# Patient Record
Sex: Female | Born: 1940 | Race: Black or African American | Hispanic: No | Smoking: Former smoker
Health system: Southern US, Community
[De-identification: ages and names within clinical notes are randomized; demographics above are authoritative.]

## PROBLEM LIST (undated history)

## (undated) DIAGNOSIS — E785 Hyperlipidemia, unspecified: Secondary | ICD-10-CM

## (undated) DIAGNOSIS — I10 Essential (primary) hypertension: Secondary | ICD-10-CM

## (undated) DIAGNOSIS — I2510 Atherosclerotic heart disease of native coronary artery without angina pectoris: Secondary | ICD-10-CM

## (undated) DIAGNOSIS — I5032 Chronic diastolic (congestive) heart failure: Secondary | ICD-10-CM

## (undated) DIAGNOSIS — I4510 Unspecified right bundle-branch block: Secondary | ICD-10-CM

## (undated) DIAGNOSIS — E119 Type 2 diabetes mellitus without complications: Secondary | ICD-10-CM

## (undated) DIAGNOSIS — G4733 Obstructive sleep apnea (adult) (pediatric): Secondary | ICD-10-CM

## (undated) HISTORY — PX: KNEE SURGERY: SHX244

## (undated) HISTORY — DX: Morbid (severe) obesity due to excess calories: E66.01

## (undated) HISTORY — PX: OTHER SURGICAL HISTORY: SHX169

## (undated) HISTORY — DX: Hyperlipidemia, unspecified: E78.5

## (undated) HISTORY — PX: CORONARY ANGIOPLASTY WITH STENT PLACEMENT: SHX49

## (undated) HISTORY — PX: BACK SURGERY: SHX140

## (undated) HISTORY — DX: Obstructive sleep apnea (adult) (pediatric): G47.33

## (undated) HISTORY — DX: Unspecified right bundle-branch block: I45.10

## (undated) HISTORY — PX: CORONARY ARTERY BYPASS GRAFT: SHX141

## (undated) HISTORY — DX: Chronic diastolic (congestive) heart failure: I50.32

---

## 1997-11-15 ENCOUNTER — Ambulatory Visit (HOSPITAL_COMMUNITY): Admission: RE | Admit: 1997-11-15 | Discharge: 1997-11-15 | Payer: Self-pay

## 1997-12-27 ENCOUNTER — Ambulatory Visit (HOSPITAL_COMMUNITY): Admission: RE | Admit: 1997-12-27 | Discharge: 1997-12-27 | Payer: Self-pay | Admitting: Orthopedic Surgery

## 1998-01-11 ENCOUNTER — Other Ambulatory Visit: Admission: RE | Admit: 1998-01-11 | Discharge: 1998-01-11 | Payer: Self-pay | Admitting: *Deleted

## 1998-01-17 ENCOUNTER — Ambulatory Visit (HOSPITAL_COMMUNITY): Admission: RE | Admit: 1998-01-17 | Discharge: 1998-01-17 | Payer: Self-pay | Admitting: *Deleted

## 1998-02-28 ENCOUNTER — Inpatient Hospital Stay (HOSPITAL_COMMUNITY): Admission: RE | Admit: 1998-02-28 | Discharge: 1998-03-07 | Payer: Self-pay | Admitting: *Deleted

## 1998-03-04 ENCOUNTER — Encounter: Payer: Self-pay | Admitting: *Deleted

## 1998-09-20 ENCOUNTER — Ambulatory Visit (HOSPITAL_COMMUNITY): Admission: RE | Admit: 1998-09-20 | Discharge: 1998-09-20 | Payer: Self-pay | Admitting: Orthopedic Surgery

## 1998-09-20 ENCOUNTER — Encounter: Payer: Self-pay | Admitting: Orthopedic Surgery

## 1999-12-05 ENCOUNTER — Ambulatory Visit (HOSPITAL_COMMUNITY): Admission: RE | Admit: 1999-12-05 | Discharge: 1999-12-05 | Payer: Self-pay | Admitting: Neurology

## 1999-12-07 ENCOUNTER — Ambulatory Visit (HOSPITAL_COMMUNITY): Admission: RE | Admit: 1999-12-07 | Discharge: 1999-12-07 | Payer: Self-pay | Admitting: Neurology

## 2000-01-28 ENCOUNTER — Encounter: Admission: RE | Admit: 2000-01-28 | Discharge: 2000-03-06 | Payer: Self-pay | Admitting: Orthopedic Surgery

## 2001-04-01 ENCOUNTER — Ambulatory Visit (HOSPITAL_COMMUNITY): Admission: RE | Admit: 2001-04-01 | Discharge: 2001-04-01 | Payer: Self-pay | Admitting: *Deleted

## 2001-04-02 ENCOUNTER — Ambulatory Visit (HOSPITAL_COMMUNITY): Admission: RE | Admit: 2001-04-02 | Discharge: 2001-04-02 | Payer: Self-pay | Admitting: Internal Medicine

## 2001-04-02 ENCOUNTER — Encounter: Payer: Self-pay | Admitting: Internal Medicine

## 2002-07-20 ENCOUNTER — Ambulatory Visit (HOSPITAL_COMMUNITY): Admission: RE | Admit: 2002-07-20 | Discharge: 2002-07-20 | Payer: Self-pay | Admitting: Orthopedic Surgery

## 2002-07-20 ENCOUNTER — Encounter: Payer: Self-pay | Admitting: Orthopedic Surgery

## 2002-08-19 ENCOUNTER — Encounter: Payer: Self-pay | Admitting: Orthopedic Surgery

## 2002-08-25 ENCOUNTER — Inpatient Hospital Stay (HOSPITAL_COMMUNITY): Admission: RE | Admit: 2002-08-25 | Discharge: 2002-08-27 | Payer: Self-pay | Admitting: Orthopedic Surgery

## 2002-08-30 ENCOUNTER — Emergency Department (HOSPITAL_COMMUNITY): Admission: EM | Admit: 2002-08-30 | Discharge: 2002-08-30 | Payer: Self-pay | Admitting: Emergency Medicine

## 2002-10-19 ENCOUNTER — Encounter: Admission: RE | Admit: 2002-10-19 | Discharge: 2003-01-13 | Payer: Self-pay | Admitting: Orthopedic Surgery

## 2002-12-20 ENCOUNTER — Emergency Department (HOSPITAL_COMMUNITY): Admission: EM | Admit: 2002-12-20 | Discharge: 2002-12-20 | Payer: Self-pay

## 2003-01-05 ENCOUNTER — Encounter: Payer: Self-pay | Admitting: Internal Medicine

## 2003-01-05 ENCOUNTER — Ambulatory Visit (HOSPITAL_COMMUNITY): Admission: RE | Admit: 2003-01-05 | Discharge: 2003-01-05 | Payer: Self-pay | Admitting: Internal Medicine

## 2003-05-13 ENCOUNTER — Emergency Department (HOSPITAL_COMMUNITY): Admission: EM | Admit: 2003-05-13 | Discharge: 2003-05-13 | Payer: Self-pay

## 2003-05-17 ENCOUNTER — Inpatient Hospital Stay (HOSPITAL_COMMUNITY): Admission: EM | Admit: 2003-05-17 | Discharge: 2003-05-20 | Payer: Self-pay | Admitting: Emergency Medicine

## 2003-05-18 ENCOUNTER — Encounter: Payer: Self-pay | Admitting: Cardiology

## 2003-06-16 ENCOUNTER — Ambulatory Visit (HOSPITAL_COMMUNITY): Admission: RE | Admit: 2003-06-16 | Discharge: 2003-06-16 | Payer: Self-pay | Admitting: Internal Medicine

## 2003-08-02 ENCOUNTER — Inpatient Hospital Stay (HOSPITAL_COMMUNITY): Admission: RE | Admit: 2003-08-02 | Discharge: 2003-08-26 | Payer: Self-pay | Admitting: Cardiology

## 2003-09-12 ENCOUNTER — Encounter
Admission: RE | Admit: 2003-09-12 | Discharge: 2003-09-12 | Payer: Self-pay | Admitting: Thoracic Surgery (Cardiothoracic Vascular Surgery)

## 2003-12-05 ENCOUNTER — Encounter (HOSPITAL_COMMUNITY): Admission: RE | Admit: 2003-12-05 | Discharge: 2004-03-04 | Payer: Self-pay | Admitting: Cardiology

## 2003-12-14 ENCOUNTER — Ambulatory Visit (HOSPITAL_COMMUNITY): Admission: RE | Admit: 2003-12-14 | Discharge: 2003-12-14 | Payer: Self-pay | Admitting: Internal Medicine

## 2004-02-29 ENCOUNTER — Ambulatory Visit (HOSPITAL_COMMUNITY): Admission: RE | Admit: 2004-02-29 | Discharge: 2004-02-29 | Payer: Self-pay | Admitting: Internal Medicine

## 2004-03-05 ENCOUNTER — Encounter (HOSPITAL_COMMUNITY): Admission: RE | Admit: 2004-03-05 | Discharge: 2004-06-03 | Payer: Self-pay | Admitting: Cardiology

## 2004-09-10 ENCOUNTER — Encounter: Admission: RE | Admit: 2004-09-10 | Discharge: 2004-10-29 | Payer: Self-pay | Admitting: Orthopedic Surgery

## 2006-09-16 ENCOUNTER — Encounter: Admission: RE | Admit: 2006-09-16 | Discharge: 2006-09-16 | Payer: Self-pay | Admitting: Orthopedic Surgery

## 2006-11-04 ENCOUNTER — Emergency Department (HOSPITAL_COMMUNITY): Admission: EM | Admit: 2006-11-04 | Discharge: 2006-11-04 | Payer: Self-pay | Admitting: Emergency Medicine

## 2007-03-18 ENCOUNTER — Encounter: Admission: RE | Admit: 2007-03-18 | Discharge: 2007-03-18 | Payer: Self-pay | Admitting: Orthopedic Surgery

## 2007-04-14 ENCOUNTER — Observation Stay (HOSPITAL_COMMUNITY): Admission: RE | Admit: 2007-04-14 | Discharge: 2007-04-15 | Payer: Self-pay | Admitting: Orthopedic Surgery

## 2007-05-11 ENCOUNTER — Encounter: Admission: RE | Admit: 2007-05-11 | Discharge: 2007-05-22 | Payer: Self-pay | Admitting: Orthopedic Surgery

## 2007-05-14 ENCOUNTER — Emergency Department (HOSPITAL_COMMUNITY): Admission: EM | Admit: 2007-05-14 | Discharge: 2007-05-14 | Payer: Self-pay | Admitting: Emergency Medicine

## 2007-05-26 ENCOUNTER — Inpatient Hospital Stay (HOSPITAL_COMMUNITY): Admission: RE | Admit: 2007-05-26 | Discharge: 2007-06-02 | Payer: Self-pay | Admitting: Orthopedic Surgery

## 2007-06-09 ENCOUNTER — Other Ambulatory Visit: Payer: Self-pay | Admitting: Emergency Medicine

## 2007-06-10 ENCOUNTER — Inpatient Hospital Stay (HOSPITAL_COMMUNITY): Admission: AD | Admit: 2007-06-10 | Discharge: 2007-06-17 | Payer: Self-pay | Admitting: Orthopedic Surgery

## 2007-10-23 ENCOUNTER — Encounter: Admission: RE | Admit: 2007-10-23 | Discharge: 2007-10-23 | Payer: Self-pay | Admitting: Orthopedic Surgery

## 2008-03-09 ENCOUNTER — Encounter: Admission: RE | Admit: 2008-03-09 | Discharge: 2008-03-09 | Payer: Self-pay | Admitting: Orthopedic Surgery

## 2008-09-26 ENCOUNTER — Ambulatory Visit (HOSPITAL_COMMUNITY): Admission: RE | Admit: 2008-09-26 | Discharge: 2008-09-26 | Payer: Self-pay | Admitting: Internal Medicine

## 2009-02-15 ENCOUNTER — Ambulatory Visit (HOSPITAL_COMMUNITY): Admission: RE | Admit: 2009-02-15 | Discharge: 2009-02-15 | Payer: Self-pay | Admitting: Internal Medicine

## 2009-02-21 ENCOUNTER — Encounter: Payer: Self-pay | Admitting: Internal Medicine

## 2009-02-22 ENCOUNTER — Inpatient Hospital Stay (HOSPITAL_COMMUNITY): Admission: EM | Admit: 2009-02-22 | Discharge: 2009-03-03 | Payer: Self-pay | Admitting: Emergency Medicine

## 2009-02-24 ENCOUNTER — Ambulatory Visit: Payer: Self-pay | Admitting: Pulmonary Disease

## 2009-06-22 ENCOUNTER — Inpatient Hospital Stay (HOSPITAL_COMMUNITY): Admission: EM | Admit: 2009-06-22 | Discharge: 2009-06-25 | Payer: Self-pay | Admitting: Emergency Medicine

## 2009-07-13 ENCOUNTER — Emergency Department (HOSPITAL_COMMUNITY): Admission: EM | Admit: 2009-07-13 | Discharge: 2009-07-13 | Payer: Self-pay | Admitting: Emergency Medicine

## 2009-08-28 ENCOUNTER — Inpatient Hospital Stay (HOSPITAL_COMMUNITY): Admission: EM | Admit: 2009-08-28 | Discharge: 2009-09-04 | Payer: Self-pay | Admitting: Emergency Medicine

## 2009-08-29 ENCOUNTER — Ambulatory Visit: Payer: Self-pay | Admitting: Surgery

## 2009-08-29 ENCOUNTER — Encounter (INDEPENDENT_AMBULATORY_CARE_PROVIDER_SITE_OTHER): Payer: Self-pay | Admitting: Internal Medicine

## 2009-09-24 ENCOUNTER — Inpatient Hospital Stay (HOSPITAL_COMMUNITY): Admission: EM | Admit: 2009-09-24 | Discharge: 2009-10-02 | Payer: Self-pay | Admitting: Emergency Medicine

## 2009-09-28 ENCOUNTER — Encounter (INDEPENDENT_AMBULATORY_CARE_PROVIDER_SITE_OTHER): Payer: Self-pay | Admitting: Internal Medicine

## 2010-09-26 LAB — COMPREHENSIVE METABOLIC PANEL
ALT: 14 U/L (ref 0–35)
ALT: 16 U/L (ref 0–35)
AST: 24 U/L (ref 0–37)
Albumin: 2.3 g/dL — ABNORMAL LOW (ref 3.5–5.2)
BUN: 7 mg/dL (ref 6–23)
CO2: 32 mEq/L (ref 19–32)
Calcium: 8.2 mg/dL — ABNORMAL LOW (ref 8.4–10.5)
Calcium: 8.2 mg/dL — ABNORMAL LOW (ref 8.4–10.5)
Calcium: 8.5 mg/dL (ref 8.4–10.5)
Creatinine, Ser: 0.86 mg/dL (ref 0.4–1.2)
Creatinine, Ser: 0.87 mg/dL (ref 0.4–1.2)
GFR calc Af Amer: >60 mL/min (ref >60)
GFR calc Af Amer: >60 mL/min (ref >60)
GFR calc non Af Amer: >60 mL/min (ref >60)
Glucose, Bld: 179 mg/dL — ABNORMAL HIGH (ref 70–99)
Glucose, Bld: 217 mg/dL — ABNORMAL HIGH (ref 70–99)
Sodium: 134 mEq/L — ABNORMAL LOW (ref 135–145)
Sodium: 140 mEq/L (ref 135–145)
Total Protein: 5.9 g/dL — ABNORMAL LOW (ref 6.0–8.3)
Total Protein: 6.2 g/dL (ref 6.0–8.3)

## 2010-09-26 LAB — CBC
HCT: 30.3 % — ABNORMAL LOW (ref 36.0–46.0)
HCT: 30.3 % — ABNORMAL LOW (ref 36.0–46.0)
HCT: 32.2 % — ABNORMAL LOW (ref 36.0–46.0)
HCT: 34.6 % — ABNORMAL LOW (ref 36.0–46.0)
Hemoglobin: 10.3 g/dL — ABNORMAL LOW (ref 12.0–15.0)
Hemoglobin: 10.9 g/dL — ABNORMAL LOW (ref 12.0–15.0)
Hemoglobin: 10.9 g/dL — ABNORMAL LOW (ref 12.0–15.0)
Hemoglobin: 11.6 g/dL — ABNORMAL LOW (ref 12.0–15.0)
Hemoglobin: 13.8 g/dL (ref 12.0–15.0)
MCHC: 33.5 g/dL (ref 30.0–36.0)
MCHC: 33.6 g/dL (ref 30.0–36.0)
MCHC: 33.7 g/dL (ref 30.0–36.0)
MCHC: 33.8 g/dL (ref 30.0–36.0)
MCHC: 33.8 g/dL (ref 30.0–36.0)
MCHC: 34.2 g/dL (ref 30.0–36.0)
MCV: 102.5 fL — ABNORMAL HIGH (ref 78.0–100.0)
MCV: 102.5 fL — ABNORMAL HIGH (ref 78.0–100.0)
MCV: 103 fL — ABNORMAL HIGH (ref 78.0–100.0)
Platelets: 142 10*3/uL — ABNORMAL LOW (ref 150–400)
Platelets: 151 10*3/uL (ref 150–400)
Platelets: 157 10*3/uL (ref 150–400)
Platelets: 181 10*3/uL (ref 150–400)
RBC: 2.94 MIL/uL — ABNORMAL LOW (ref 3.87–5.11)
RBC: 2.98 MIL/uL — ABNORMAL LOW (ref 3.87–5.11)
RBC: 3.17 MIL/uL — ABNORMAL LOW (ref 3.87–5.11)
RBC: 3.47 MIL/uL — ABNORMAL LOW (ref 3.87–5.11)
RBC: 3.98 MIL/uL (ref 3.87–5.11)
RDW: 14.3 % (ref 11.5–15.5)
RDW: 14.3 % (ref 11.5–15.5)
RDW: 14.8 % (ref 11.5–15.5)
RDW: 15 % (ref 11.5–15.5)
WBC: 5.7 10*3/uL (ref 4.0–10.5)
WBC: 9 10*3/uL (ref 4.0–10.5)

## 2010-09-26 LAB — GLUCOSE, CAPILLARY
Glucose-Capillary: 122 mg/dL — ABNORMAL HIGH (ref 70–99)
Glucose-Capillary: 145 mg/dL — ABNORMAL HIGH (ref 70–99)
Glucose-Capillary: 147 mg/dL — ABNORMAL HIGH (ref 70–99)
Glucose-Capillary: 158 mg/dL — ABNORMAL HIGH (ref 70–99)
Glucose-Capillary: 169 mg/dL — ABNORMAL HIGH (ref 70–99)
Glucose-Capillary: 174 mg/dL — ABNORMAL HIGH (ref 70–99)
Glucose-Capillary: 176 mg/dL — ABNORMAL HIGH (ref 70–99)
Glucose-Capillary: 177 mg/dL — ABNORMAL HIGH (ref 70–99)
Glucose-Capillary: 188 mg/dL — ABNORMAL HIGH (ref 70–99)
Glucose-Capillary: 192 mg/dL — ABNORMAL HIGH (ref 70–99)
Glucose-Capillary: 197 mg/dL — ABNORMAL HIGH (ref 70–99)
Glucose-Capillary: 199 mg/dL — ABNORMAL HIGH (ref 70–99)
Glucose-Capillary: 201 mg/dL — ABNORMAL HIGH (ref 70–99)
Glucose-Capillary: 203 mg/dL — ABNORMAL HIGH (ref 70–99)
Glucose-Capillary: 243 mg/dL — ABNORMAL HIGH (ref 70–99)
Glucose-Capillary: 252 mg/dL — ABNORMAL HIGH (ref 70–99)

## 2010-09-26 LAB — LIPID PANEL
Cholesterol: 105 mg/dL (ref 0–200)
Cholesterol: 109 mg/dL (ref 0–200)
HDL: 43 mg/dL (ref >39)
LDL Cholesterol: 41 mg/dL (ref 0–99)
LDL Cholesterol: 47 mg/dL (ref 0–99)
Total CHOL/HDL Ratio: 2.4 RATIO
Triglycerides: 103 mg/dL (ref <150)
Triglycerides: 84 mg/dL (ref <150)
VLDL: 17 mg/dL (ref 0–40)

## 2010-09-26 LAB — BASIC METABOLIC PANEL
BUN: 2 mg/dL — ABNORMAL LOW (ref 6–23)
BUN: 3 mg/dL — ABNORMAL LOW (ref 6–23)
BUN: 4 mg/dL — ABNORMAL LOW (ref 6–23)
CO2: 29 mEq/L (ref 19–32)
CO2: 29 mEq/L (ref 19–32)
CO2: 30 mEq/L (ref 19–32)
CO2: 30 mEq/L (ref 19–32)
Calcium: 8.5 mg/dL (ref 8.4–10.5)
Calcium: 8.5 mg/dL (ref 8.4–10.5)
Chloride: 100 mEq/L (ref 96–112)
Creatinine, Ser: 0.68 mg/dL (ref 0.4–1.2)
GFR calc Af Amer: >60 mL/min (ref >60)
GFR calc Af Amer: >60 mL/min (ref >60)
GFR calc non Af Amer: >60 mL/min (ref >60)
GFR calc non Af Amer: >60 mL/min (ref >60)
Glucose, Bld: 153 mg/dL — ABNORMAL HIGH (ref 70–99)
Glucose, Bld: 222 mg/dL — ABNORMAL HIGH (ref 70–99)
Potassium: 3.9 mEq/L (ref 3.5–5.1)
Potassium: 4.1 mEq/L (ref 3.5–5.1)
Potassium: 4.1 mEq/L (ref 3.5–5.1)
Potassium: 4.2 mEq/L (ref 3.5–5.1)
Sodium: 131 mEq/L — ABNORMAL LOW (ref 135–145)
Sodium: 132 mEq/L — ABNORMAL LOW (ref 135–145)
Sodium: 134 mEq/L — ABNORMAL LOW (ref 135–145)
Sodium: 135 mEq/L (ref 135–145)

## 2010-09-26 LAB — URINE CULTURE
Colony Count: NO GROWTH
Culture: NO GROWTH

## 2010-09-26 LAB — POCT I-STAT, CHEM 8
BUN: 4 mg/dL — ABNORMAL LOW (ref 6–23)
Chloride: 95 mEq/L — ABNORMAL LOW (ref 96–112)
Potassium: 2.8 mEq/L — ABNORMAL LOW (ref 3.5–5.1)
Sodium: 139 mEq/L (ref 135–145)

## 2010-09-26 LAB — HEPARIN LEVEL (UNFRACTIONATED): Heparin Unfractionated: 0.79 IU/mL — ABNORMAL HIGH (ref 0.30–0.70)

## 2010-09-26 LAB — DIFFERENTIAL
Basophils Absolute: 0 10*3/uL (ref 0.0–0.1)
Basophils Relative: 0 % (ref 0–1)
Lymphocytes Relative: 13 % (ref 12–46)
Lymphs Abs: 1 10*3/uL (ref 0.7–4.0)
Monocytes Absolute: 0.3 10*3/uL (ref 0.1–1.0)
Monocytes Relative: 5 % (ref 3–12)
Monocytes Relative: 7 % (ref 3–12)
Neutro Abs: 6.2 10*3/uL (ref 1.7–7.7)
Neutro Abs: 9.5 10*3/uL — ABNORMAL HIGH (ref 1.7–7.7)
Neutrophils Relative %: 81 % — ABNORMAL HIGH (ref 43–77)

## 2010-09-26 LAB — HEPATIC FUNCTION PANEL
ALT: 15 U/L (ref 0–35)
AST: 24 U/L (ref 0–37)
Albumin: 2.4 g/dL — ABNORMAL LOW (ref 3.5–5.2)
Alkaline Phosphatase: 72 U/L (ref 39–117)
Total Protein: 6.4 g/dL (ref 6.0–8.3)

## 2010-09-26 LAB — URINALYSIS, ROUTINE W REFLEX MICROSCOPIC
Nitrite: NEGATIVE
Protein, ur: 30 mg/dL — AB
Specific Gravity, Urine: 1.019 (ref 1.005–1.030)
Urobilinogen, UA: 1 mg/dL (ref 0.0–1.0)

## 2010-09-26 LAB — CK TOTAL AND CKMB (NOT AT ARMC)
CK, MB: 2 ng/mL (ref 0.3–4.0)
Total CK: 44 U/L (ref 7–177)

## 2010-09-26 LAB — LIPASE, BLOOD
Lipase: 1134 U/L — ABNORMAL HIGH (ref 11–59)
Lipase: 115 U/L — ABNORMAL HIGH (ref 11–59)
Lipase: 73 U/L — ABNORMAL HIGH (ref 11–59)

## 2010-09-26 LAB — TROPONIN I
Troponin I: 0.03 ng/mL (ref 0.00–0.06)
Troponin I: 0.03 ng/mL (ref 0.00–0.06)

## 2010-09-26 LAB — AMYLASE: Amylase: 201 U/L — ABNORMAL HIGH (ref 0–105)

## 2010-09-26 LAB — POCT CARDIAC MARKERS
CKMB, poc: 2.6 ng/mL (ref 1.0–8.0)
Troponin i, poc: <0.05 ng/mL (ref 0.00–0.09)

## 2010-09-26 LAB — MAGNESIUM: Magnesium: 1.5 mg/dL (ref 1.5–2.5)

## 2010-09-26 LAB — CARDIAC PANEL(CRET KIN+CKTOT+MB+TROPI): Total CK: 49 U/L (ref 7–177)

## 2010-09-26 LAB — APTT: aPTT: 32 seconds (ref 24–37)

## 2010-09-26 LAB — PHOSPHORUS: Phosphorus: 3.6 mg/dL (ref 2.3–4.6)

## 2010-09-26 LAB — URINE MICROSCOPIC-ADD ON

## 2010-09-26 LAB — CREATININE, URINE, RANDOM: Creatinine, Urine: 256.6 mg/dL

## 2010-09-26 LAB — TSH: TSH: 1.794 u[IU]/mL (ref 0.350–4.500)

## 2010-09-26 LAB — D-DIMER, QUANTITATIVE: D-Dimer, Quant: 6.13 ug/mL-FEU — ABNORMAL HIGH (ref 0.00–0.48)

## 2010-09-28 LAB — CBC
HCT: 32.1 % — ABNORMAL LOW (ref 36.0–46.0)
HCT: 33.8 % — ABNORMAL LOW (ref 36.0–46.0)
HCT: 42.5 % (ref 36.0–46.0)
Hemoglobin: 10.9 g/dL — ABNORMAL LOW (ref 12.0–15.0)
Hemoglobin: 11.2 g/dL — ABNORMAL LOW (ref 12.0–15.0)
Hemoglobin: 14 g/dL (ref 12.0–15.0)
MCHC: 33 g/dL (ref 30.0–36.0)
MCHC: 33.2 g/dL (ref 30.0–36.0)
MCHC: 33.4 g/dL (ref 30.0–36.0)
MCV: 98.9 fL (ref 78.0–100.0)
MCV: 99.5 fL (ref 78.0–100.0)
MCV: 99.7 fL (ref 78.0–100.0)
Platelets: 135 10*3/uL — ABNORMAL LOW (ref 150–400)
Platelets: 162 10*3/uL (ref 150–400)
Platelets: 199 K/uL (ref 150–400)
Platelets: ADEQUATE 10*3/uL (ref 150–400)
RBC: 4.29 MIL/uL (ref 3.87–5.11)
RDW: 15.9 % — ABNORMAL HIGH (ref 11.5–15.5)
RDW: 15.9 % — ABNORMAL HIGH (ref 11.5–15.5)
RDW: 16 % — ABNORMAL HIGH (ref 11.5–15.5)
RDW: 16.1 % — ABNORMAL HIGH (ref 11.5–15.5)
RDW: 16.1 % — ABNORMAL HIGH (ref 11.5–15.5)
WBC: 11.7 K/uL — ABNORMAL HIGH (ref 4.0–10.5)
WBC: 6.1 10*3/uL (ref 4.0–10.5)

## 2010-09-28 LAB — URINALYSIS, ROUTINE W REFLEX MICROSCOPIC
Glucose, UA: NEGATIVE mg/dL
Ketones, ur: NEGATIVE mg/dL
Nitrite: NEGATIVE
Protein, ur: 30 mg/dL — AB
Specific Gravity, Urine: 1.021 (ref 1.005–1.030)
Urobilinogen, UA: 1 mg/dL (ref 0.0–1.0)
pH: 6 (ref 5.0–8.0)

## 2010-09-28 LAB — CARDIAC PANEL(CRET KIN+CKTOT+MB+TROPI)
Relative Index: INVALID (ref 0.0–2.5)
Relative Index: INVALID (ref 0.0–2.5)
Total CK: 38 U/L (ref 7–177)
Troponin I: 0.01 ng/mL (ref 0.00–0.06)
Troponin I: 0.02 ng/mL (ref 0.00–0.06)
Troponin I: 0.02 ng/mL (ref 0.00–0.06)

## 2010-09-28 LAB — GLUCOSE, CAPILLARY
Glucose-Capillary: 103 mg/dL — ABNORMAL HIGH (ref 70–99)
Glucose-Capillary: 122 mg/dL — ABNORMAL HIGH (ref 70–99)
Glucose-Capillary: 130 mg/dL — ABNORMAL HIGH (ref 70–99)
Glucose-Capillary: 132 mg/dL — ABNORMAL HIGH (ref 70–99)
Glucose-Capillary: 132 mg/dL — ABNORMAL HIGH (ref 70–99)
Glucose-Capillary: 142 mg/dL — ABNORMAL HIGH (ref 70–99)
Glucose-Capillary: 142 mg/dL — ABNORMAL HIGH (ref 70–99)
Glucose-Capillary: 144 mg/dL — ABNORMAL HIGH (ref 70–99)
Glucose-Capillary: 144 mg/dL — ABNORMAL HIGH (ref 70–99)
Glucose-Capillary: 153 mg/dL — ABNORMAL HIGH (ref 70–99)
Glucose-Capillary: 158 mg/dL — ABNORMAL HIGH (ref 70–99)
Glucose-Capillary: 159 mg/dL — ABNORMAL HIGH (ref 70–99)
Glucose-Capillary: 164 mg/dL — ABNORMAL HIGH (ref 70–99)
Glucose-Capillary: 164 mg/dL — ABNORMAL HIGH (ref 70–99)
Glucose-Capillary: 164 mg/dL — ABNORMAL HIGH (ref 70–99)
Glucose-Capillary: 165 mg/dL — ABNORMAL HIGH (ref 70–99)
Glucose-Capillary: 166 mg/dL — ABNORMAL HIGH (ref 70–99)
Glucose-Capillary: 170 mg/dL — ABNORMAL HIGH (ref 70–99)
Glucose-Capillary: 170 mg/dL — ABNORMAL HIGH (ref 70–99)
Glucose-Capillary: 173 mg/dL — ABNORMAL HIGH (ref 70–99)
Glucose-Capillary: 175 mg/dL — ABNORMAL HIGH (ref 70–99)
Glucose-Capillary: 175 mg/dL — ABNORMAL HIGH (ref 70–99)
Glucose-Capillary: 179 mg/dL — ABNORMAL HIGH (ref 70–99)
Glucose-Capillary: 181 mg/dL — ABNORMAL HIGH (ref 70–99)
Glucose-Capillary: 181 mg/dL — ABNORMAL HIGH (ref 70–99)
Glucose-Capillary: 194 mg/dL — ABNORMAL HIGH (ref 70–99)
Glucose-Capillary: 199 mg/dL — ABNORMAL HIGH (ref 70–99)
Glucose-Capillary: 199 mg/dL — ABNORMAL HIGH (ref 70–99)
Glucose-Capillary: 204 mg/dL — ABNORMAL HIGH (ref 70–99)
Glucose-Capillary: 204 mg/dL — ABNORMAL HIGH (ref 70–99)
Glucose-Capillary: 205 mg/dL — ABNORMAL HIGH (ref 70–99)
Glucose-Capillary: 210 mg/dL — ABNORMAL HIGH (ref 70–99)
Glucose-Capillary: 215 mg/dL — ABNORMAL HIGH (ref 70–99)
Glucose-Capillary: 225 mg/dL — ABNORMAL HIGH (ref 70–99)
Glucose-Capillary: 232 mg/dL — ABNORMAL HIGH (ref 70–99)
Glucose-Capillary: 242 mg/dL — ABNORMAL HIGH (ref 70–99)
Glucose-Capillary: 247 mg/dL — ABNORMAL HIGH (ref 70–99)
Glucose-Capillary: 248 mg/dL — ABNORMAL HIGH (ref 70–99)
Glucose-Capillary: 293 mg/dL — ABNORMAL HIGH (ref 70–99)
Glucose-Capillary: 91 mg/dL (ref 70–99)
Glucose-Capillary: 99 mg/dL (ref 70–99)

## 2010-09-28 LAB — COMPREHENSIVE METABOLIC PANEL
ALT: 19 U/L (ref 0–35)
ALT: 28 U/L (ref 0–35)
AST: 44 U/L — ABNORMAL HIGH (ref 0–37)
Albumin: 2.3 g/dL — ABNORMAL LOW (ref 3.5–5.2)
Alkaline Phosphatase: 65 U/L (ref 39–117)
BUN: 2 mg/dL — ABNORMAL LOW (ref 6–23)
Calcium: 8.9 mg/dL (ref 8.4–10.5)
Calcium: 9.3 mg/dL (ref 8.4–10.5)
GFR calc Af Amer: >60 mL/min (ref >60)
Potassium: 3.7 mEq/L (ref 3.5–5.1)
Sodium: 135 mEq/L (ref 135–145)
Sodium: 140 mEq/L (ref 135–145)
Total Protein: 5.8 g/dL — ABNORMAL LOW (ref 6.0–8.3)
Total Protein: 7.8 g/dL (ref 6.0–8.3)

## 2010-09-28 LAB — DIFFERENTIAL
Basophils Absolute: 0.1 K/uL (ref 0.0–0.1)
Basophils Relative: 1 % (ref 0–1)
Eosinophils Absolute: 0.2 10*3/uL (ref 0.0–0.7)
Eosinophils Relative: 2 % (ref 0–5)
Lymphocytes Relative: 20 % (ref 12–46)
Lymphs Abs: 2.3 10*3/uL (ref 0.7–4.0)
Monocytes Absolute: 0.8 K/uL (ref 0.1–1.0)
Monocytes Relative: 7 % (ref 3–12)
Neutro Abs: 8.3 K/uL — ABNORMAL HIGH (ref 1.7–7.7)
Neutrophils Relative %: 71 % (ref 43–77)

## 2010-09-28 LAB — TROPONIN I: Troponin I: 0.02 ng/mL (ref 0.00–0.06)

## 2010-09-28 LAB — POCT CARDIAC MARKERS
CKMB, poc: <1 ng/mL — ABNORMAL LOW (ref 1.0–8.0)
Myoglobin, poc: 79 ng/mL (ref 12–200)
Troponin i, poc: <0.05 ng/mL (ref 0.00–0.09)

## 2010-09-28 LAB — BASIC METABOLIC PANEL
BUN: 1 mg/dL — ABNORMAL LOW (ref 6–23)
BUN: 2 mg/dL — ABNORMAL LOW (ref 6–23)
BUN: <1 mg/dL — ABNORMAL LOW (ref 6–23)
CO2: 25 mEq/L (ref 19–32)
CO2: 27 mEq/L (ref 19–32)
CO2: 30 mEq/L (ref 19–32)
Chloride: 102 mEq/L (ref 96–112)
Chloride: 110 mEq/L (ref 96–112)
Chloride: 97 mEq/L (ref 96–112)
Creatinine, Ser: 0.6 mg/dL (ref 0.4–1.2)
Creatinine, Ser: 0.78 mg/dL (ref 0.4–1.2)
GFR calc Af Amer: >60 mL/min (ref >60)
GFR calc non Af Amer: >60 mL/min (ref >60)
GFR calc non Af Amer: >60 mL/min (ref >60)
Glucose, Bld: 112 mg/dL — ABNORMAL HIGH (ref 70–99)
Glucose, Bld: 120 mg/dL — ABNORMAL HIGH (ref 70–99)
Glucose, Bld: 155 mg/dL — ABNORMAL HIGH (ref 70–99)
Potassium: 3.2 mEq/L — ABNORMAL LOW (ref 3.5–5.1)
Potassium: 3.6 mEq/L (ref 3.5–5.1)
Potassium: 3.8 mEq/L (ref 3.5–5.1)
Sodium: 136 mEq/L (ref 135–145)
Sodium: 138 mEq/L (ref 135–145)

## 2010-09-28 LAB — COMPREHENSIVE METABOLIC PANEL WITH GFR
Albumin: 3.1 g/dL — ABNORMAL LOW (ref 3.5–5.2)
Alkaline Phosphatase: 84 U/L (ref 39–117)
BUN: 10 mg/dL (ref 6–23)
CO2: 32 meq/L (ref 19–32)
Chloride: 94 meq/L — ABNORMAL LOW (ref 96–112)
Creatinine, Ser: 0.82 mg/dL (ref 0.4–1.2)
GFR calc non Af Amer: >60 mL/min (ref >60)
Glucose, Bld: 162 mg/dL — ABNORMAL HIGH (ref 70–99)
Potassium: 3.1 meq/L — ABNORMAL LOW (ref 3.5–5.1)
Total Bilirubin: 1.4 mg/dL — ABNORMAL HIGH (ref 0.3–1.2)

## 2010-09-28 LAB — BLOOD GAS, ARTERIAL
Acid-base deficit: 0.5 mmol/L (ref 0.0–2.0)
FIO2: 100 %
O2 Content: 10 L/min
Patient temperature: 98.6
pCO2 arterial: 49.6 mmHg — ABNORMAL HIGH (ref 35.0–45.0)

## 2010-09-28 LAB — CK TOTAL AND CKMB (NOT AT ARMC)
CK, MB: 1 ng/mL (ref 0.3–4.0)
Relative Index: INVALID (ref 0.0–2.5)
Total CK: 14 U/L (ref 7–177)

## 2010-09-28 LAB — LIPASE, BLOOD
Lipase: 334 U/L — ABNORMAL HIGH (ref 11–59)
Lipase: 55 U/L (ref 11–59)

## 2010-09-28 LAB — MRSA PCR SCREENING: MRSA by PCR: NEGATIVE

## 2010-09-28 LAB — URINE MICROSCOPIC-ADD ON

## 2010-09-28 LAB — URINE CULTURE: Colony Count: >=100000

## 2010-09-28 LAB — AMYLASE: Amylase: 91 U/L (ref 0–105)

## 2010-10-08 LAB — POCT CARDIAC MARKERS
CKMB, poc: 4.2 ng/mL (ref 1.0–8.0)
Troponin i, poc: <0.05 ng/mL (ref 0.00–0.09)

## 2010-10-08 LAB — DIFFERENTIAL
Basophils Absolute: 0.1 10*3/uL (ref 0.0–0.1)
Basophils Absolute: 0.1 10*3/uL (ref 0.0–0.1)
Basophils Relative: 1 % (ref 0–1)
Eosinophils Absolute: 0.1 10*3/uL (ref 0.0–0.7)
Lymphocytes Relative: 31 % (ref 12–46)
Lymphocytes Relative: 34 % (ref 12–46)
Lymphs Abs: 1.9 10*3/uL (ref 0.7–4.0)
Monocytes Absolute: 0.6 10*3/uL (ref 0.1–1.0)
Monocytes Relative: 10 % (ref 3–12)
Monocytes Relative: 9 % (ref 3–12)
Neutro Abs: 2.9 10*3/uL (ref 1.7–7.7)
Neutro Abs: 3.3 10*3/uL (ref 1.7–7.7)
Neutrophils Relative %: 60 % (ref 43–77)

## 2010-10-08 LAB — URINE CULTURE
Colony Count: NO GROWTH
Culture: NO GROWTH

## 2010-10-08 LAB — GLUCOSE, CAPILLARY
Glucose-Capillary: 135 mg/dL — ABNORMAL HIGH (ref 70–99)
Glucose-Capillary: 148 mg/dL — ABNORMAL HIGH (ref 70–99)
Glucose-Capillary: 173 mg/dL — ABNORMAL HIGH (ref 70–99)
Glucose-Capillary: 257 mg/dL — ABNORMAL HIGH (ref 70–99)
Glucose-Capillary: 49 mg/dL — ABNORMAL LOW (ref 70–99)
Glucose-Capillary: 51 mg/dL — ABNORMAL LOW (ref 70–99)
Glucose-Capillary: 76 mg/dL (ref 70–99)
Glucose-Capillary: 84 mg/dL (ref 70–99)
Glucose-Capillary: 85 mg/dL (ref 70–99)
Glucose-Capillary: 87 mg/dL (ref 70–99)
Glucose-Capillary: 98 mg/dL (ref 70–99)

## 2010-10-08 LAB — HEPATIC FUNCTION PANEL
Alkaline Phosphatase: 57 U/L (ref 39–117)
Indirect Bilirubin: 0.2 mg/dL — ABNORMAL LOW (ref 0.3–0.9)
Total Bilirubin: 0.5 mg/dL (ref 0.3–1.2)

## 2010-10-08 LAB — BASIC METABOLIC PANEL
BUN: 26 mg/dL — ABNORMAL HIGH (ref 6–23)
BUN: 27 mg/dL — ABNORMAL HIGH (ref 6–23)
CO2: 16 mEq/L — ABNORMAL LOW (ref 19–32)
CO2: 21 mEq/L (ref 19–32)
Calcium: 8.3 mg/dL — ABNORMAL LOW (ref 8.4–10.5)
Calcium: 9.3 mg/dL (ref 8.4–10.5)
Calcium: 9.3 mg/dL (ref 8.4–10.5)
Chloride: 110 mEq/L (ref 96–112)
Chloride: 118 mEq/L — ABNORMAL HIGH (ref 96–112)
Creatinine, Ser: 1.03 mg/dL (ref 0.4–1.2)
Creatinine, Ser: 1.58 mg/dL — ABNORMAL HIGH (ref 0.4–1.2)
Creatinine, Ser: 1.62 mg/dL — ABNORMAL HIGH (ref 0.4–1.2)
GFR calc Af Amer: 38 mL/min — ABNORMAL LOW (ref >60)
GFR calc Af Amer: >60 mL/min (ref >60)
GFR calc Af Amer: >60 mL/min (ref >60)
GFR calc non Af Amer: 31 mL/min — ABNORMAL LOW (ref >60)
GFR calc non Af Amer: 31 mL/min — ABNORMAL LOW (ref >60)
GFR calc non Af Amer: 53 mL/min — ABNORMAL LOW (ref >60)
GFR calc non Af Amer: 54 mL/min — ABNORMAL LOW (ref >60)
Glucose, Bld: 136 mg/dL — ABNORMAL HIGH (ref 70–99)
Potassium: 3.6 mEq/L (ref 3.5–5.1)
Sodium: 136 mEq/L (ref 135–145)
Sodium: 137 mEq/L (ref 135–145)
Sodium: 142 mEq/L (ref 135–145)

## 2010-10-08 LAB — CBC
HCT: 27.6 % — ABNORMAL LOW (ref 36.0–46.0)
Hemoglobin: 10.7 g/dL — ABNORMAL LOW (ref 12.0–15.0)
Hemoglobin: 11.3 g/dL — ABNORMAL LOW (ref 12.0–15.0)
Hemoglobin: 8.9 g/dL — ABNORMAL LOW (ref 12.0–15.0)
MCHC: 32.2 g/dL (ref 30.0–36.0)
MCHC: 32.6 g/dL (ref 30.0–36.0)
MCV: 99.2 fL (ref 78.0–100.0)
Platelets: 154 10*3/uL (ref 150–400)
Platelets: 188 10*3/uL (ref 150–400)
RBC: 2.71 MIL/uL — ABNORMAL LOW (ref 3.87–5.11)
RBC: 2.78 MIL/uL — ABNORMAL LOW (ref 3.87–5.11)
RBC: 3.31 MIL/uL — ABNORMAL LOW (ref 3.87–5.11)
RDW: 17 % — ABNORMAL HIGH (ref 11.5–15.5)
WBC: 5.4 10*3/uL (ref 4.0–10.5)
WBC: 5.6 10*3/uL (ref 4.0–10.5)
WBC: 6.5 10*3/uL (ref 4.0–10.5)

## 2010-10-08 LAB — POTASSIUM
Potassium: 4.8 mEq/L (ref 3.5–5.1)
Potassium: 6.6 mEq/L (ref 3.5–5.1)

## 2010-10-08 LAB — URINE MICROSCOPIC-ADD ON

## 2010-10-08 LAB — URINALYSIS, ROUTINE W REFLEX MICROSCOPIC
Nitrite: NEGATIVE
Protein, ur: 30 mg/dL — AB
Specific Gravity, Urine: 1.029 (ref 1.005–1.030)
Urobilinogen, UA: 1 mg/dL (ref 0.0–1.0)

## 2010-10-08 LAB — LACTIC ACID, PLASMA: Lactic Acid, Venous: 2.4 mmol/L — ABNORMAL HIGH (ref 0.5–2.2)

## 2010-10-08 LAB — VITAMIN B12: Vitamin B-12: 554 pg/mL (ref 211–911)

## 2010-10-13 LAB — BASIC METABOLIC PANEL
BUN: 20 mg/dL (ref 6–23)
BUN: 27 mg/dL — ABNORMAL HIGH (ref 6–23)
CO2: 24 mEq/L (ref 19–32)
CO2: 27 mEq/L (ref 19–32)
CO2: 27 mEq/L (ref 19–32)
Calcium: 8.6 mg/dL (ref 8.4–10.5)
Calcium: 9.3 mg/dL (ref 8.4–10.5)
Chloride: 91 mEq/L — ABNORMAL LOW (ref 96–112)
Chloride: 93 mEq/L — ABNORMAL LOW (ref 96–112)
Chloride: 97 mEq/L (ref 96–112)
Creatinine, Ser: 1.1 mg/dL (ref 0.4–1.2)
GFR calc Af Amer: 34 mL/min — ABNORMAL LOW (ref >60)
GFR calc Af Amer: 59 mL/min — ABNORMAL LOW (ref >60)
GFR calc non Af Amer: 49 mL/min — ABNORMAL LOW (ref >60)
Glucose, Bld: 241 mg/dL — ABNORMAL HIGH (ref 70–99)
Glucose, Bld: 312 mg/dL — ABNORMAL HIGH (ref 70–99)
Glucose, Bld: 510 mg/dL (ref 70–99)
Potassium: 3.7 mEq/L (ref 3.5–5.1)
Potassium: 4 mEq/L (ref 3.5–5.1)
Potassium: 4.5 mEq/L (ref 3.5–5.1)
Potassium: 4.5 mEq/L (ref 3.5–5.1)
Sodium: 128 mEq/L — ABNORMAL LOW (ref 135–145)
Sodium: 131 mEq/L — ABNORMAL LOW (ref 135–145)
Sodium: 132 mEq/L — ABNORMAL LOW (ref 135–145)
Sodium: 133 mEq/L — ABNORMAL LOW (ref 135–145)

## 2010-10-13 LAB — URINALYSIS, ROUTINE W REFLEX MICROSCOPIC
Bilirubin Urine: NEGATIVE
Glucose, UA: >1000 mg/dL — AB
Ketones, ur: NEGATIVE mg/dL
Protein, ur: NEGATIVE mg/dL
Urobilinogen, UA: 1 mg/dL (ref 0.0–1.0)

## 2010-10-13 LAB — CBC
HCT: 28.7 % — ABNORMAL LOW (ref 36.0–46.0)
HCT: 29.1 % — ABNORMAL LOW (ref 36.0–46.0)
HCT: 29.6 % — ABNORMAL LOW (ref 36.0–46.0)
HCT: 30.7 % — ABNORMAL LOW (ref 36.0–46.0)
HCT: 34 % — ABNORMAL LOW (ref 36.0–46.0)
HCT: 35.3 % — ABNORMAL LOW (ref 36.0–46.0)
HCT: 35.9 % — ABNORMAL LOW (ref 36.0–46.0)
Hemoglobin: 10 g/dL — ABNORMAL LOW (ref 12.0–15.0)
Hemoglobin: 10.2 g/dL — ABNORMAL LOW (ref 12.0–15.0)
Hemoglobin: 10.9 g/dL — ABNORMAL LOW (ref 12.0–15.0)
Hemoglobin: 11.8 g/dL — ABNORMAL LOW (ref 12.0–15.0)
Hemoglobin: 12.3 g/dL (ref 12.0–15.0)
Hemoglobin: 9.9 g/dL — ABNORMAL LOW (ref 12.0–15.0)
MCHC: 34.3 g/dL (ref 30.0–36.0)
MCHC: 34.5 g/dL (ref 30.0–36.0)
MCHC: 34.5 g/dL (ref 30.0–36.0)
MCHC: 34.9 g/dL (ref 30.0–36.0)
MCHC: 34.9 g/dL (ref 30.0–36.0)
MCHC: 38.4 g/dL — ABNORMAL HIGH (ref 30.0–36.0)
MCV: 93 fL (ref 78.0–100.0)
MCV: 97 fL (ref 78.0–100.0)
MCV: 97.7 fL (ref 78.0–100.0)
MCV: 97.8 fL (ref 78.0–100.0)
Platelets: 135 10*3/uL — ABNORMAL LOW (ref 150–400)
Platelets: 137 10*3/uL — ABNORMAL LOW (ref 150–400)
Platelets: 145 10*3/uL — ABNORMAL LOW (ref 150–400)
Platelets: 146 10*3/uL — ABNORMAL LOW (ref 150–400)
RBC: 2.9 MIL/uL — ABNORMAL LOW (ref 3.87–5.11)
RBC: 3.03 MIL/uL — ABNORMAL LOW (ref 3.87–5.11)
RBC: 3.19 MIL/uL — ABNORMAL LOW (ref 3.87–5.11)
RBC: 3.27 MIL/uL — ABNORMAL LOW (ref 3.87–5.11)
RBC: 3.53 MIL/uL — ABNORMAL LOW (ref 3.87–5.11)
RBC: 3.64 MIL/uL — ABNORMAL LOW (ref 3.87–5.11)
RBC: 3.68 MIL/uL — ABNORMAL LOW (ref 3.87–5.11)
RDW: 14.2 % (ref 11.5–15.5)
RDW: 14.4 % (ref 11.5–15.5)
RDW: 14.8 % (ref 11.5–15.5)
WBC: 10.2 10*3/uL (ref 4.0–10.5)
WBC: 10.2 10*3/uL (ref 4.0–10.5)
WBC: 11.1 10*3/uL — ABNORMAL HIGH (ref 4.0–10.5)
WBC: 7.3 10*3/uL (ref 4.0–10.5)
WBC: 8.9 10*3/uL (ref 4.0–10.5)
WBC: 8.9 10*3/uL (ref 4.0–10.5)
WBC: 9 10*3/uL (ref 4.0–10.5)
WBC: 9.3 10*3/uL (ref 4.0–10.5)

## 2010-10-13 LAB — GLUCOSE, CAPILLARY
Glucose-Capillary: 133 mg/dL — ABNORMAL HIGH (ref 70–99)
Glucose-Capillary: 140 mg/dL — ABNORMAL HIGH (ref 70–99)
Glucose-Capillary: 141 mg/dL — ABNORMAL HIGH (ref 70–99)
Glucose-Capillary: 160 mg/dL — ABNORMAL HIGH (ref 70–99)
Glucose-Capillary: 192 mg/dL — ABNORMAL HIGH (ref 70–99)
Glucose-Capillary: 216 mg/dL — ABNORMAL HIGH (ref 70–99)
Glucose-Capillary: 226 mg/dL — ABNORMAL HIGH (ref 70–99)
Glucose-Capillary: 234 mg/dL — ABNORMAL HIGH (ref 70–99)
Glucose-Capillary: 240 mg/dL — ABNORMAL HIGH (ref 70–99)
Glucose-Capillary: 249 mg/dL — ABNORMAL HIGH (ref 70–99)
Glucose-Capillary: 256 mg/dL — ABNORMAL HIGH (ref 70–99)
Glucose-Capillary: 265 mg/dL — ABNORMAL HIGH (ref 70–99)
Glucose-Capillary: 275 mg/dL — ABNORMAL HIGH (ref 70–99)
Glucose-Capillary: 276 mg/dL — ABNORMAL HIGH (ref 70–99)
Glucose-Capillary: 294 mg/dL — ABNORMAL HIGH (ref 70–99)
Glucose-Capillary: 295 mg/dL — ABNORMAL HIGH (ref 70–99)
Glucose-Capillary: 298 mg/dL — ABNORMAL HIGH (ref 70–99)
Glucose-Capillary: 299 mg/dL — ABNORMAL HIGH (ref 70–99)
Glucose-Capillary: 301 mg/dL — ABNORMAL HIGH (ref 70–99)
Glucose-Capillary: 306 mg/dL — ABNORMAL HIGH (ref 70–99)
Glucose-Capillary: 326 mg/dL — ABNORMAL HIGH (ref 70–99)
Glucose-Capillary: 333 mg/dL — ABNORMAL HIGH (ref 70–99)
Glucose-Capillary: 391 mg/dL — ABNORMAL HIGH (ref 70–99)
Glucose-Capillary: 84 mg/dL (ref 70–99)

## 2010-10-13 LAB — COMPREHENSIVE METABOLIC PANEL
ALT: 49 U/L — ABNORMAL HIGH (ref 0–35)
ALT: 56 U/L — ABNORMAL HIGH (ref 0–35)
ALT: 72 U/L — ABNORMAL HIGH (ref 0–35)
AST: 34 U/L (ref 0–37)
AST: 38 U/L — ABNORMAL HIGH (ref 0–37)
AST: 41 U/L — ABNORMAL HIGH (ref 0–37)
AST: 44 U/L — ABNORMAL HIGH (ref 0–37)
Albumin: 2.5 g/dL — ABNORMAL LOW (ref 3.5–5.2)
Alkaline Phosphatase: 74 U/L (ref 39–117)
BUN: 35 mg/dL — ABNORMAL HIGH (ref 6–23)
CO2: 24 mEq/L (ref 19–32)
CO2: 25 mEq/L (ref 19–32)
CO2: 26 mEq/L (ref 19–32)
Calcium: 8.4 mg/dL (ref 8.4–10.5)
Calcium: 9.2 mg/dL (ref 8.4–10.5)
Chloride: 90 mEq/L — ABNORMAL LOW (ref 96–112)
Chloride: 92 mEq/L — ABNORMAL LOW (ref 96–112)
Chloride: 97 mEq/L (ref 96–112)
Creatinine, Ser: 3.09 mg/dL — ABNORMAL HIGH (ref 0.4–1.2)
Creatinine, Ser: 3.7 mg/dL — ABNORMAL HIGH (ref 0.4–1.2)
GFR calc Af Amer: 15 mL/min — ABNORMAL LOW (ref >60)
GFR calc Af Amer: 17 mL/min — ABNORMAL LOW (ref >60)
GFR calc Af Amer: 58 mL/min — ABNORMAL LOW (ref >60)
GFR calc non Af Amer: 12 mL/min — ABNORMAL LOW (ref >60)
GFR calc non Af Amer: 15 mL/min — ABNORMAL LOW (ref >60)
GFR calc non Af Amer: 48 mL/min — ABNORMAL LOW (ref >60)
Glucose, Bld: 239 mg/dL — ABNORMAL HIGH (ref 70–99)
Glucose, Bld: 294 mg/dL — ABNORMAL HIGH (ref 70–99)
Potassium: 4.1 mEq/L (ref 3.5–5.1)
Sodium: 125 mEq/L — ABNORMAL LOW (ref 135–145)
Sodium: 135 mEq/L (ref 135–145)
Total Bilirubin: 1.4 mg/dL — ABNORMAL HIGH (ref 0.3–1.2)
Total Bilirubin: 1.4 mg/dL — ABNORMAL HIGH (ref 0.3–1.2)
Total Bilirubin: 1.6 mg/dL — ABNORMAL HIGH (ref 0.3–1.2)
Total Protein: 6.1 g/dL (ref 6.0–8.3)

## 2010-10-13 LAB — URINE CULTURE
Colony Count: NO GROWTH
Culture: NO GROWTH

## 2010-10-13 LAB — DIFFERENTIAL
Eosinophils Relative: 0 % (ref 0–5)
Lymphocytes Relative: 27 % (ref 12–46)
Lymphs Abs: 2.8 10*3/uL (ref 0.7–4.0)
Monocytes Relative: 7 % (ref 3–12)
Neutrophils Relative %: 65 % (ref 43–77)

## 2010-10-13 LAB — MAGNESIUM: Magnesium: 1.7 mg/dL (ref 1.5–2.5)

## 2010-10-13 LAB — IRON AND TIBC: TIBC: 223 ug/dL — ABNORMAL LOW (ref 250–470)

## 2010-10-13 LAB — URINALYSIS, MICROSCOPIC ONLY
Ketones, ur: 15 mg/dL — AB
Nitrite: NEGATIVE
Urobilinogen, UA: 1 mg/dL (ref 0.0–1.0)
pH: 5 (ref 5.0–8.0)

## 2010-10-13 LAB — CULTURE, BLOOD (ROUTINE X 2): Culture: NO GROWTH

## 2010-10-13 LAB — CARDIAC PANEL(CRET KIN+CKTOT+MB+TROPI)
CK, MB: 6.6 ng/mL — ABNORMAL HIGH (ref 0.3–4.0)
Relative Index: INVALID (ref 0.0–2.5)
Total CK: 105 U/L (ref 7–177)
Total CK: 76 U/L (ref 7–177)
Troponin I: 0.05 ng/mL (ref 0.00–0.06)
Troponin I: 0.07 ng/mL — ABNORMAL HIGH (ref 0.00–0.06)
Troponin I: 0.09 ng/mL — ABNORMAL HIGH (ref 0.00–0.06)

## 2010-10-13 LAB — VITAMIN D 1,25 DIHYDROXY
Vitamin D 1, 25 (OH)2 Total: <8 pg/mL — ABNORMAL LOW (ref 18–72)
Vitamin D3 1, 25 (OH)2: <8 pg/mL

## 2010-10-13 LAB — BLOOD GAS, ARTERIAL
Acid-Base Excess: 2.7 mmol/L — ABNORMAL HIGH (ref 0.0–2.0)
Bicarbonate: 26.1 mEq/L — ABNORMAL HIGH (ref 20.0–24.0)
O2 Saturation: 92.9 %
TCO2: 27.2 mmol/L (ref 0–100)
pO2, Arterial: 60.8 mmHg — ABNORMAL LOW (ref 80.0–100.0)

## 2010-10-13 LAB — LIPID PANEL
HDL: 62 mg/dL (ref >39)
LDL Cholesterol: 125 mg/dL — ABNORMAL HIGH (ref 0–99)
Total CHOL/HDL Ratio: 3.9 RATIO
Triglycerides: 272 mg/dL — ABNORMAL HIGH (ref <150)
VLDL: 54 mg/dL — ABNORMAL HIGH (ref 0–40)

## 2010-10-13 LAB — CORTISOL-AM, BLOOD: Cortisol - AM: 9.2 ug/dL (ref 4.3–22.4)

## 2010-10-13 LAB — RETICULOCYTES
RBC.: 2.83 MIL/uL — ABNORMAL LOW (ref 3.87–5.11)
Retic Count, Absolute: 167 10*3/uL (ref 19.0–186.0)

## 2010-10-13 LAB — CK TOTAL AND CKMB (NOT AT ARMC)
CK, MB: 6 ng/mL — ABNORMAL HIGH (ref 0.3–4.0)
Relative Index: INVALID (ref 0.0–2.5)
Total CK: 59 U/L (ref 7–177)

## 2010-10-13 LAB — BRAIN NATRIURETIC PEPTIDE: Pro B Natriuretic peptide (BNP): 35 pg/mL (ref 0.0–100.0)

## 2010-10-13 LAB — URINE MICROSCOPIC-ADD ON

## 2010-10-13 LAB — POCT CARDIAC MARKERS
CKMB, poc: 3 ng/mL (ref 1.0–8.0)
Troponin i, poc: <0.05 ng/mL (ref 0.00–0.09)

## 2010-10-13 LAB — TROPONIN I: Troponin I: 0.07 ng/mL — ABNORMAL HIGH (ref 0.00–0.06)

## 2010-10-13 LAB — FOLATE RBC: RBC Folate: 731 ng/mL — ABNORMAL HIGH (ref 180–600)

## 2010-10-13 LAB — MRSA PCR SCREENING: MRSA by PCR: NEGATIVE

## 2010-10-13 LAB — TSH: TSH: 2.816 u[IU]/mL (ref 0.350–4.500)

## 2010-10-27 DIAGNOSIS — K219 Gastro-esophageal reflux disease without esophagitis: Secondary | ICD-10-CM

## 2010-10-27 DIAGNOSIS — I10 Essential (primary) hypertension: Secondary | ICD-10-CM

## 2010-11-20 NOTE — H&P (Signed)
Lisa [name], Lisa [name]              ACCOUNT NO.:  [id number]   MEDICAL RECORD NO.:  [id number]          PATIENT TYPE:  INP   LOCATION:  6715                         FACILITY:  [facility]   PHYSICIAN:  Myrtie Neither, MD      DATE OF BIRTH:  1941/02/02   DATE OF ADMISSION:  06/10/2007  DATE OF DISCHARGE:                              HISTORY & PHYSICAL   CHIEF COMPLAINT:  Decubitus gluteal area.   PERTINENT HISTORY:  This is a 70 year old diabetic female patient, who  recently was discharged status post right total knee arthroplasty.  Patient's right total knee has done quite well.  Patient had some early  pressure area over the gluteal area prior to discharge and was  recommended for nursing home care, but patient and family refused  nursing home care and preferred taking the patient home.  Patient has  been home and has had increased breakdown of the sacral area.  Patient  has had home physical therapy, home health nurse and prophylactic  Coumadin therapy with INR being checked and dressing changes to the  sacral area.  Patient is being admitted after contact with her nurse,  stating that the area had been progressively worsened.  Patient's  daughter states that she has difficulty trying to handle her by herself.  The other sister had left town.   PAST MEDICAL HISTORY:  1. Diabetes mellitus.  2. Bilateral knee replacement.  3. Degenerative disc disease.  4. Hypertension.  5. Thyroid disorder.   ALLERGIES:  None known.   MEDICATIONS:  1. Omeprazole 20 mg.  2. Percocet 5 mg.  3. Qualaquin 324 mg.  4. Coumadin 5 mg daily.  5. Glimepiride 4 mg b.i.d.  6. Simvastatin 4 mg q.h.s.  7. Diovan 320 mg daily.  8. KCL 20 mg.  9. Colace 100 mg b.i.d.  10.Folic acid 1 mg daily.  11.Amlodipine 10 mg daily.  12.Lasix 40 mg daily.  13.Indocin 75 mg b.i.d.  14.Detrol LA 2 mg daily.  15.Aspirin 325 daily.  16.Combivent 2 puffs p.r.n.  17.Meclizine 25 mg q.6 p.r.n.  18.Allopurinol 150 mg  daily.  19.Multivitamin.  20.Pamatolol 0.1% eye drop.  21.Novolin 70/30 25 units subqu daily.  22.Levemir Flex Pen.   Family history of high blood pressure, diabetes mellitus.   SOCIAL HISTORY:  Negative.  No history of use of alcohol or tobacco or  illegal drugs.   REVIEW OF SYMPTOMS:  Basically been doing quite well with her new right  knee, but no cardiac, respiratory.  No urinary or bowel symptoms.   PHYSICAL EXAMINATION:  GENERAL:  Alert and oriented.  No acute distress.  VITAL SIGNS:  Temperature 98.7.  Pulse 80.  Respirations 18.  Blood  pressure is stable.  Head:  Normocephalic/atraumatic.  Eyes:  Conjunctival and sclerae clear  and neck supple.  CHEST:  Clear.  CARDIAC:  S1, S2 regular.  Right knee:  Staples in place.  Wound well-healed.  Flexion 90 degrees  full extension.  Sacral area:  Necrotic tissue in the crease of the buttocks with some  granulation tissue base.  Area is approximately 3 x  4 inches in  dimension.   IMPRESSION:  1. Sacral decubitus.  2. Status post right total knee arthroplasty.  3. History of high blood pressure.  4. History of diabetes mellitus.   PLAN:  1. Wound care evaluation and treatment of sacral decubitus.  2. Plan for nursing home placement.  3. IV antibiotics.  4. Continue with physical therapy for right total knee.      Myrtie Neither, MD  Electronically Signed     AC/MEDQ  D:  06/10/2007  T:  06/11/2007  Job:  [id number]

## 2010-11-20 NOTE — Consult Note (Signed)
Lisa [name], CAPPELLI              ACCOUNT NO.:  [id number]   MEDICAL RECORD NO.:  [id number]          PATIENT TYPE:  OBV   LOCATION:  2105                         FACILITY:  [facility]   PHYSICIAN:  Felipa Evener, MD  DATE OF BIRTH:  [birth year]   DATE OF CONSULTATION:  DATE OF DISCHARGE:                                 CONSULTATION   REQUESTING PHYSICIAN:  Margaretmary Bayley, MD   HISTORY OF PRESENT ILLNESS:  This is a morbidly obese African American  female 70 year old that was admitted on February 20, 2009 for acute  dyspnea, hypoxemia, initially with some renal insufficiency.  The  patient was started on nebulized bronchodilators O2 therapy.  She  underwent a V/Q scan which was low probability for pulmonary embolism  and renal function continued to worsen and on February 24, 2009 the  patient was complaining of dizziness and weakness while sitting up in a  chair.  Nursing had difficulty getting blood pressure.  Doppler blood  pressure showed systolic pressure around 50.  The patient was  transferred to ICU and Critical Care Team was requested to consult.  The  patient was given IV challenge.  With good response blood pressure on  initial evaluation had improved to systolics in the 120s.  The patient  does have a history of diabetes and hypertension.  She was on Benicar  and Lasix on admission which was continued.  The patient's serum  creatinine on admission was 1.1.  The patient's creatinine continued to  rise and is at 3.7.  The patient states that she did have initial  shortness of breath but that has improved with oxygen therapy.  The  patient has a history of coronary artery disease with previous CABG in  2008.  She did have elevated cardiac enzymes during this admission and  Cardiology is on board.  Echo on February 14, 2009 showed poor quality  with a normal ejection fraction with some paradoxical septal motion.  The patient was continued on medical management.   PAST MEDICAL  HISTORY:  1. Hypertension.  2. Type 2 diabetes mellitus.  3. DJD status post bilateral knee replacement.  Status post rotator      cuff repairs.  4. Coronary artery disease status post CABG in 2008.  5. Hyperlipidemia.  6. Gout.  7. COPD.   MEDICATIONS PRIOR TO ADMISSION:  1. Lasix 40 mg b.i.d.  2. Allopurinol 150 mg daily.  3. Simvastatin 80 mg at bedtime.  4. Amaryl 4 mg b.i.d.  5. Diovan 320 mg daily.  6. Singulair 10 mg daily.  7. Combivent b.i.d.  8. Aspirin 325 mg daily.   CURRENT MEDICATIONS:  1. Advair 250/50 b.i.d.  2. Lantus 45 units at bedtime.  3. Lovenox at bedtime.  4. NovoLog sliding scale at bedtime.  5. Protonix 40 mg daily.  6. Senokot at bedtime.  7. Unasyn b.i.d.  8. Albuterol q.i.d.  9. Renal dose dopamine.  10.Ambien p.r.n.  11.Ultram p.r.n.  12.Vicodin p.r.n.  13.Xanax p.r.n.   DRUG ALLERGIES:  No known drug allergies.   SOCIAL HISTORY:  The patient lives alone in her  private residence.  She  is a former smoker, quit smoking in 2010 from half-a-pack a day history  for approximately 30+ years.  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  Does not  have any pets.  Denies any alcohol or drug use.   FAMILY HISTORY:  Noncontributory.   REVIEW OF SYSTEMS:  The patient reports dyspnea is improved.  She denies  any abdominal pain, bloody stools, fever, leg swelling, nausea,  vomiting, confusion, headache, visual changes.  Does complain of  dizziness that has improved now.   OBJECTIVE:  GENERAL:  This is a morbidly obese female that is alert,  talking and sitting up in the bed.  VITAL SIGNS:  Blood pressure 124/67, heart rate is 86, respiratory rate  is 16.  She is afebrile.  O2 saturation is 100% on 2 liters.  HEENT:  The patient has several broken teeth noted.  Oral mucosa is pink  and moist.  NECK:  Short and thick.  No palpable adenopathy.  No JVD.  LUNGS:  Lung sounds reveal diminished breath sounds at the bases,  otherwise, clear with no wheezing.   CARDIAC:  Regular rate and rhythm without murmur, rub or gallop.  ABDOMEN:  Morbidly obese with large panniculus.  Soft and nontender with  positive bowel sounds throughout.  EXTREMITIES:  Warm without any calf tenderness, cyanosis or clubbing.  There is trace edema with good pulses bilaterally.  NEURO:  The patient  is alert and oriented x3.  Appropriate.  No focal deficits noted.  SKIN:  Intact, no rash.  Did not visualize her back side but per nursing  no known skin ulcers.  PSYCH:  Pleasant and appropriate.   DATA:  Chest x-ray showed a prior sternotomy for CABG, linear scarring  in the left upper lobe and lingula that is unchanged.  Otherwise, clear.  V/Q scan on February 20, 2009 showed slight moderate ventilation perfusion  defect low probability for acute pulmonary embolism.  CT abdomen and  pelvis showed hepatic steatosis without evidence of biliary dilatation.  Persistent calcification of the right renal pelvis and lower poles may  reflect a partial staghorn calculus or milk of calcium.  CT pelvis was  negative.  ABG pH 7.4, PCO2 35, PO2 60, bicarb 26, sat 92.  White blood  cell count 11,000, hemoglobin 10,900, platelet 128,000.  Sodium 127,  potassium 4.6, chloride 92, bicarb 25, BUN 33, creatinine 3.70, glucose  294, AST 41, ALT 49, albumin 2.2.  Cardiac enzymes troponin 0.07, CK-MB  6.8.  CK is 105.  UA shows moderate leukocyte esterase, many bacteria,  wbc's 11-20.  Blood cultures x2 are pending, MRSA is pending and urine  culture is pending.  BNP 55.  Sed rate 67.  TSH 2.8, D-dimer 0.71.  Two-  D echo showed a poor quality with normal ejection fraction and  paradoxical septal motion.   IMPRESSION AND PLAN:  1. Hypotension.  The patient was overcompensated with antihypertensive      with worsening renal function.  The patient has now been stopped      off of her ARBs and diuretics.  She has responded nicely to fluid      challenges and has good urine output.  We will give  fluid      resuscitation gently.  We will monitor blood pressure closely as      needed and as of now does not need any further pressors.  Dopamine      will be discontinued.  2. The  patient has underlying airway obstruction.  We will continue      nebulized bronchodilators.  She has a history of previous smoking      and has an FEV-1 of 51%.  3. Suspect underlying obstructive sleep apnea.  We will need an      outpatient sleep study and sleep consult.  May need to evaluate for      nocturnal CPAP if needed.  Appreciate Pulmonary Critical Care will      be available on an as-needed basis.      Rubye Oaks, NP      Marius Ditch Jefm Miles, MD  Electronically Signed    TP/MEDQ  D:  02/24/2009  T:  02/25/2009  Job:  [id number]

## 2010-11-20 NOTE — Discharge Summary (Signed)
Lisa [name], Lisa [name]              ACCOUNT NO.:  [id number]   MEDICAL RECORD NO.:  [id number]          PATIENT TYPE:  INP   LOCATION:  6715                         FACILITY:  [facility]   PHYSICIAN:  Myrtie Neither, MD      DATE OF BIRTH:  [birth year]   DATE OF ADMISSION:  06/10/2007  DATE OF DISCHARGE:  06/10/2007                               DISCHARGE SUMMARY   ADMITTING DIAGNOSES:  1. Decubitus ulcer, sacrum.  2. Status post right total knee arthroplasty.   PERTINENT HISTORY:  This is a 70 year old diabetic female who had  recently been discharged from a total knee replacement on the right  side.  The patient had done well but had developed early pressure area  over the sacral area prior to discharge.  This ulceration progressively  worsened while she was at home.  The patient is being admitted for wound  care to the sacral area.   PERTINENT PHYSICAL:  Is that of the sacrum, with a large sacral  ulceration down to the skin and subcutaneous tissue and fascia with  necrotic tissue measuring 3 inches by 4 inches in dimension.  The right  knee range of motion good but limited.  Wound was healed quite well.  No  sign of infection.   HOSPITAL COURSE:  The patient was placed on IV vancomycin initially.  Cultures were taken from the area.  Consult with wound care was done and  the patient was started on lavage wound debridements to the sacrum.  The  patient was placed on KinAir bed.  Cultures did not show any evidence of  MRSA and antibiotics were switched over to Unasyn 3 g q.8h. IV.  The  patient presently doing quite well both with her physical therapy with  that right knee, as well as her debridement twice a day.  The patient is  able to be discharged to continue wound care, wound debridements b.i.d.  The patient is to continue physical therapy for the right knee with  range of motion and ambulation with use of walker, weightbearing as  tolerated on the right side.  The patient has had  a flareup of gout in  the right foot and is following appropriate medications for this.   MEDICATION:  1. Omeprazole 20 mg p.o. daily.  2. Percocet 5 mg one to two q.6h. p.r.n.  3. Qualaquin 324 mg q.12h. p.r.n.  4. Coumadin 5 mg dosage per pharmacy along with INR checks.  5. Glimepiride 4 mg b.i.d.  6. Simvastatin 40 mg q.h.s.  7. Dexamethasone 4 mg daily.  8. Diovan 320 mg daily.  9. Potassium chloride 20 mEq daily.  10.Colace 100 mg b.i.d.  11.Folic acid 1 mg daily.  12.Amlodipine 10 mg daily.  13.Furosemide 4 mg daily.  14.Detrol LA 2 mg daily.  15.Aspirin 325 daily.  16.Combivent two puffs q.8h. p.r.n.  17.Meclizine 25 mg q.6h. p.r.n.  18.Allopurinol 150 mg daily.  19.Colchicine 0.6 mg daily.  20.Multivitamin daily.  21.Patanol 0.1% eye drops b.i.d. p.r.n.  22.Novolin 70/30, 25 units subcu daily.  23.Levemir FlexPen at breakfast and dinner.  24.The patient is  also on Celebrex 200 mg b.i.d.  25.Cipro 500 mg b.i.d.  26.Dilaudid 2 mg q.6h. p.r.n. for debridements.   Continue physical therapy, weightbearing as tolerated with the use of  walker, range of motion exercise.  The patient is to return to the  office in a 2-week period.  The patient is being discharged in stable  and satisfactory condition.      Myrtie Neither, MD  Electronically Signed     AC/MEDQ  D:  06/15/2007  T:  06/16/2007  Job:  [id number]

## 2010-11-20 NOTE — Op Note (Signed)
Lisa [name], Lisa [name]              ACCOUNT NO.:  [id number]   MEDICAL RECORD NO.:  [id number]          PATIENT TYPE:  INP   LOCATION:  2550                         FACILITY:  [facility]   PHYSICIAN:  Myrtie Neither, MD      DATE OF BIRTH:  Feb 05, 1941   DATE OF PROCEDURE:  04/14/2007  DATE OF DISCHARGE:                               OPERATIVE REPORT   PREOPERATIVE DIAGNOSIS:  Impingement syndrome, left shoulder.   POSTOPERATIVE DIAGNOSIS:  Impingement syndrome with partial tear, left  rotator cuff, chronic synovitis and osteophyte.   ANESTHESIA:  General.   PROCEDURE:  Arthroscopic acromioplasty and decompression with  synovectomy left shoulder.   The patient taken to the operating room after given adequate preop  medications given general anesthesia and intubated.  The patient placed  in barber chair position, left shoulder prepped with DuraPrep, draped in  sterile manner.  A 1/2 inch puncture wound made posteriorly.  Swisher  rod was placed posterior to anterior, anterior inflow water puncture was  then made.  Lateral puncture wound was made for the shaver.  Inspection  of the joint revealed hypertrophic subacromial bursal with eburnation of  the subacromial surface, osteophyte anteriorly of the acromion and  marked tendopathy of the rotator cuff anteriorly and laterally.  Rotator  cuff was only partially torn.  Complete synovectomy was initially done  followed by acromioplasty with use of the shaver.  After release of the  coracohumeral ligament after adequate decompression and synovectomy,  subacromial space was retained, rotator cuff did not need repair.  Wound  closure was then done with 0 nylon.  14 mL of 0.5% Marcaine was injected  into the joint.  Compressive dressing was applied and sling was applied.  The patient tolerated procedure quite well and went to recovery room in  stable and satisfactory position.      Myrtie Neither, MD  Electronically Signed     AC/MEDQ  D:   04/14/2007  T:  04/14/2007  Job:  [id number]

## 2010-11-20 NOTE — Consult Note (Signed)
Lisa [name], Lisa [name]              ACCOUNT NO.:  [id number]   MEDICAL RECORD NO.:  [id number]          PATIENT TYPE:  OBV   LOCATION:  2105                         FACILITY:  [facility]   PHYSICIAN:  Lyn Records, M.D.   DATE OF BIRTH:  1940-09-25   DATE OF CONSULTATION:  02/23/2009  DATE OF DISCHARGE:                                 CONSULTATION   REASON FOR CONSULTATION:  Mildly elevated cardiac markers.   CONCLUSIONS:  1. History of coronary artery disease with bypass surgery 2008 with      LIMA to LAD, SVG to OM.  2. Mildly elevated CK-MB and troponin markers on this admission of      uncertain significance.  3. Chronic obstructive pulmonary disease.  4. Lower obesity.  5. Diabetes mellitus.  6. Hypertension.  7. Rheumatoid arthritis.   RECOMMENDATIONS:  1. I am happy that a V/Q scan has been performed and is low      probability.  If we did not find a definite explanation for the      patient's dyspnea, however, we may need to pursue angiography to      rule out PE/pulmonary hypertension as the source of the patient's      dyspnea.  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  2. Mildly persistent CK-MB of uncertain significance.  May actually      suggest the possibility of coronary artery disease progression or      bypass graft failure.  Under the circumstances, however, she is not      a candidate for extensive reevaluation with ischemic workup or      angiography.  Therefore, I would add low-dose long-acting nitrates      to help control potential ischemia.  3. Consider evaluation for obesity hypoventilation/obstructive sleep      apnea syndrome.   COMMENT:  The patient is 30 and has multiple significant comorbid  conditions including coronary artery disease, diabetes mellitus, marked  obesity with sedentary lifestyle, and arthritis.  She is not very active  because of severe arthritis.  She was admitted to the hospital because  of progressive dyspnea.  On admission, her room air O2 saturation was  99%.  She has had rare of any chest discomfort since bypass surgery back  in 2008.  Initial evaluation by Dr. Chestine Spore suggested the absence of  pulmonary emboli as a V/Q scan was low probability.  She has had no  prolonged episodes of chest pain and cardiac markers have not been  diagnostic for infarction.   MEDICATIONS ON ADMISSION TO THE HOSPITAL:  1. Lasix 40 mg b.i.d.  2. Allopurinol 150 mg per day.  3. Simvastatin 80 mg per day.  4. Amaryl 4 mg b.i.d.  5. Aspirin 325 mg per day.  6. Combivent two puffs b.i.d.  7. Singulair 10 mg per day.  8. Diovan 320 mg per day.   FAMILY HISTORY:  Noncontributory.   SOCIAL HISTORY:  Noncontributory.  PHYSICAL EXAMINATION:  VITAL SIGNS:  Blood pressure was 122/70, heart  rate 70, markedly obese, no respiratory distress, O2 saturation 100% on  2 liters per minute.  Cardiac:  1/6 systolic murmur.  No diastolic murmur.  LUNGS:  Clear.  ABDOMEN:  Markedly obese, nontender.  EXTREMITIES:  No edema.   EKGs demonstrate right bundle inferior infarct, left axis deviation,  first-degree AV block, but no other significant or acute changes.  Laboratory data revealed renal insufficiency.  CK-MB determinations were  repeatedly mildly elevated with MB fractions of around 5 to 7.  BNP  level was normal.  BUN and creatinine have progressively increased with  diuresis.  Creatinine at the time of consultation is 2.76.  Chest x-ray  does not reveal evidence of pulmonary edema.   DISCUSSION:  The patient is difficult to assess for Dyspnea.  These  symptoms may be related to deconditioning, pulmonary hypertension  (likely obesity hypoventilation) and diastolic left heart failure.  I do  not believe that an acute cardiac problem is currently present.  Management should be conservative.      Lyn Records, M.D.   Electronically Signed     HWS/MEDQ  D:  02/24/2009  T:  02/25/2009  Job:  [id number]

## 2010-11-20 NOTE — Op Note (Signed)
Lisa [name], Lisa [name]              ACCOUNT NO.:  [id number]   MEDICAL RECORD NO.:  [id number]          PATIENT TYPE:  INP   LOCATION:  5037                         FACILITY:  [facility]   PHYSICIAN:  Lisa Neither, Lisa [name]      DATE OF BIRTH:  1940-12-01   DATE OF PROCEDURE:  05/26/2007  DATE OF DISCHARGE:                               OPERATIVE REPORT   PREOPERATIVE DIAGNOSIS:  Degenerative joint disease right knee.   POSTOPERATIVE DIAGNOSIS:  Degenerative joint disease right knee.   ANESTHESIA:  General.   PROCEDURE:  Right total knee arthroplasty, Biomet implant.  The patient  was taken to the operating room after given adequate preoperative  medications, given general anesthesia and intubated.  Right lower  extremity was prepped with DuraPrep and draped in sterile manner.  Tourniquet Bovie used for hemostasis.  Anterior midline incision made  over the right knee going through the skin and subcutaneous tissue.  Sharp and blunt dissection made both medial and laterally about the  capsule.  A medial paramedian incision was made into the capsule  extending from the quadriceps down to the tibial tuberosity.  Soft  tissue resection was necessary in order to take the knee into full  flexed position with subluxing the patella laterally.  Osteophytes about  the patella, femur and tibia was done after adequate soft tissue release  and resection.  Tibial cutting jig was put in place at 10 mm.  Tibial  plateau surface was resected, followed by reaming down the femoral  canal.  Distal femoral cutting jig was put in place.  Sizing was that of  60 mm femoral component, 60 mm cutting jig was put in place, followed by  anterior and posterior cuts and chamfer cuts.  Trial implant was found  to fit very snug to the femur.  Sizing of the tibia was a 71 mm,  appropriate cutting jig was put in place and the appropriate cut made.  Trial components of the tibia and femur were put in place.  Knee was  taken  into a full extension, most stable with the 14 mm poly with good  medial lateral stability and full extension, full flexion.  The patella  was sized at 34 mm and appropriate cutting jig was put in place.  With  all three trial components, the knee was taken to full flexion, to full  extension, good medial and lateral stability, but with subluxation  laterally of the patella.  Soft tissue lateral release was done which  allowed the patella to track very well.  Copious irrigation was then  done, followed by mixing of methyl methacrylate.  The femoral component  was press-fitted.  Tibia and patella were cemented after aspects of the  methyl methacrylate was removed and the cement had set.  Final poly  tibial component was then locked in place and again full flexion, full  extension, good medial and lateral stability without subluxation of the  patella.  Tourniquet was let down.  Hemostasis obtained.  Wound closure  was then done with 0 Vicryl for the fascia, 2-0 for the subcutaneous and  skin staples for the skin.  Bulky compressive dressing was applied.  Knee immobilizer applied.  The patient tolerated the procedure quite  well and went to the recovery room in stable and satisfactory position.      Lisa Neither, Lisa [name]  Electronically Signed     AC/MEDQ  D:  05/26/2007  T:  05/26/2007  Job:  [id number]

## 2010-11-23 NOTE — H&P (Signed)
NAME:  Lisa Lisa [name], Lisa Lisa [name]                        ACCOUNT NO.:  [id number]   MEDICAL RECORD NO.:  [id number]                   PATIENT TYPE:  INP   LOCATION:  2550                                 FACILITY:  [facility]   PHYSICIAN:  Myrtie Neither, M.D.                 DATE OF BIRTH:  10-02-1940   DATE OF ADMISSION:  08/25/2002  DATE OF DISCHARGE:                                HISTORY & PHYSICAL   CHIEF COMPLAINT:  Painful right shoulder.   HISTORY OF PRESENT ILLNESS:  This is Lisa [name] 70 year old who has been followed in  the office over the past several months for impingement and right rotator  cuff injury.  The patient had undergone therapeutic injection, warm  compresses, and anti-inflammatories, as well as pain medication.  The  patient had done fairly well up until the last few months with progressive  weakness, limited range of motion, and loss of abduction of the right  shoulder.  The patient had MRI done which showed tenopathy as well as cuff  tear.   PAST MEDICAL HISTORY:  1. Back surgery in 2000.  2. Bladder surgery in 2000.  3. Cyst removal.  4. Left foot surgery.  5. Left total knee replacement in 1993.  6. Degenerative joint disease.  7. High blood pressure.  8. Bronchial asthma.   SOCIAL HISTORY:  The patient has Lisa [name] daughter that stays with her.  No habits.  Also has grandchildren living with her.   ALLERGIES:  None known.   MEDICATIONS:  Prevacid, Vicodin, Toprol-XL, Hyzaar, Elavil, Norvasc, and  albuterol.   REVIEW OF SYSTEMS:  Some episodes of lower back pain as well as shoulder  pain.  Some episodes of shortness of breath from bronchitis and cold  recently.  No urinary or bowel symptoms.   MEDICAL DOCTOR:  Margaretmary Bayley, M.D.   PHYSICAL EXAMINATION:  GENERAL:  Alert and oriented, no acute distress.  VITAL SIGNS:  Temperature 97.4, pulse 90, respirations 22, blood pressure  187/89.  Height 63 inches, weight 264, obese.  HEENT:  Normocephalic.  Eyes:  Conjunctivae  and sclerae clear.  NECK:  Supple.  CHEST:  Some rhonchi at the bases.  ABDOMEN:  Soft, active bowel sounds.  CARDIAC:  S1, S2, regular.  EXTREMITIES:  Right shoulder limited abduction with pain on attempted  abduction at 85 degrees with some weakening of abduction on resistance.  Good grip.  Pinch and intrinsics intact.  Tender anterior and lateral with  subacromial crepitus.   IMPRESSION:  1. Impingement syndrome, right shoulder.  2. Rotator cuff tear, right shoulder.  3. History of high blood pressure.  4. Diabetes mellitus.  5. Obesity.   PLAN:  Acromioplasty and rotator cuff repair, right shoulder.  Myrtie Neither, M.D.    AC/MEDQ  D:  08/25/2002  T:  08/25/2002  Job:  [id number]

## 2010-11-23 NOTE — Discharge Summary (Signed)
Lisa [name], Lisa [name]              ACCOUNT NO.:  [id number]   MEDICAL RECORD NO.:  [id number]          PATIENT TYPE:  INP   LOCATION:  5037                         FACILITY:  [facility]   PHYSICIAN:  Myrtie Neither, MD      DATE OF BIRTH:  1940/09/26   DATE OF ADMISSION:  05/26/2007  DATE OF DISCHARGE:  06/02/2007                               DISCHARGE SUMMARY   ADMISSION DIAGNOSES:  1. Degenerative joint disease right knee.  2. History of diabetes.  3. Obesity.  4. Coronary artery disease.  5. Hypertension.  6. Gastroesophageal reflux disease.   DISCHARGE DIAGNOSIS:  1. Degenerative joint disease right knee.  2. History of diabetes.  3. Obesity.  4. Coronary artery disease.  5. Hypertension.  6. Gastroesophageal reflux disease.  7. Acute blood loss anemia.  8. Hypoglycemic episode.   INFECTIONS:  None.   OPERATIONS:  Right total knee arthroplasty.   PERTINENT HISTORY:  This is a 70 year old female who has been followed  in the office for degenerative joint disease involving her right knee.  The patient had been treated with antiinflammatories and pain  medications as well as therapeutic injection.  The patient ambulates  with a cane as well as walker.  Pertinent physical of the right knee  tender medial and laterally and patellofemoral joint tenderness and  crepitus.  Range of motion good, but limited genu valgum.  Negative  Homans' test, neurovascular test intact.  X-ray revealed loss of medial-  lateral joint space with sclerosis and osteophytes.   HOSPITAL COURSE:  The patient had preop laboratory CBC, EKG, chest x-ray  C&S, PT/PTT, platelet count. The patient's labs were stable enough to  undergo surgery.  The patient underwent right total knee arthroplasty  and tolerated the procedure quite well.  Postop course - the patient had  mix up of her medications.  The patient had given listing of use of  insulin different than what she actually had been taking in home.   This  resulted in a drop of her blood sugar.  The patient was also placed on  diabetic diet which almost eliminated the need for insulin.  The patient  progressed slowly in physical therapy, difficult getting the patient up  and about due to her weight.  This resulted in an early pressure sore  developed above the sacrum.  The patient is recommended for extended  care in a nursing home stay, but insisted that was to go home to be  cared for in helped by her 2 daughters.  Arrangements were made for home  health and PT and the patient's pain was brought under control.  Nutritional improved with diabetic diet teaching.  The patient  progressed well enough to be discharged and was discharged to continue  on her  Percocet 1-2 q. 4 p.r.n. pain, Coumadin therapy, INRs at home,  physical therapy home health, weightbearing as tolerated.   CONDITION ON DISCHARGE:  The patient was discharged in stable and  satisfactory condition.   FOLLOWUP:  The patient to return to our office in 1 week after  discharge.  Myrtie Neither, MD  Electronically Signed     AC/MEDQ  D:  07/07/2007  T:  07/08/2007  Job:  [id number]

## 2010-11-23 NOTE — Discharge Summary (Signed)
NAME:  Lisa Lisa [name], Lisa Lisa [name]                        ACCOUNT NO.:  [id number]   MEDICAL RECORD NO.:  [id number]                   PATIENT TYPE:  INP   LOCATION:  2031                                 FACILITY:  [facility]   PHYSICIAN:  Salvatore Decent. Cornelius Moras, M.D.              DATE OF BIRTH:  Nov 15, 1940   DATE OF ADMISSION:  08/02/2003  DATE OF DISCHARGE:  08/26/2003                                 DISCHARGE SUMMARY   HISTORY OF PRESENT ILLNESS:  This is Lisa [name] 70 year old morbidly obese female  with longstanding hypertension, type 2 diabetes mellitus, severe arthritis,  and limited mobility who presented with Lisa [name] several month history of  progressive exertional shortness of breath and recent onset of exertional  angina. She was initially evaluated by Dr. Chestine Spore and referred by Dr. Mayford Knife  for further management. Over the last few weeks prior to admission she  developed some symptoms of exertional chest pain described as Lisa [name] choking pain  that radiates to the neck and down the left arm. Initial occurrence was  approximately two weeks prior to admission when she was washing clothes. She  stopped her activity and the pain resolved promptly within Lisa [name] few minutes.  She has had some more or less severe episodes of similar pain in the last  couple of weeks, always with exertion, but relieved with rest. The symptoms  were, however, felt to be progressive in regard to her shortness of breath  over the last several months. She felt she was short of breath with quite  minimal activity. She also had some lower extremity edema which became quite  stable.  Because of her symptoms she was felt to require admission for  elective cardiac catheterization and was admitted this hospitalization for  the procedure.   PAST MEDICAL HISTORY:  1. Morbid obesity.  2. Longstanding hypertension.  3. Type 2 diabetes mellitus.  4. Rheumatoid arthritis.  5. Osteoarthritis.  6. Depression.   PAST SURGICAL HISTORY:  1. Left total knee  replacement.  2. Lower back surgery.  3. Repair of right rotator cuff.  4. Repair of bladder and vaginal prolapse.   MEDICATIONS ON ADMISSION:  1. Cozaar 100 mg once daily.  2. Imdur 30 mg daily.  3. Hydralazine 15 mg t.i.d.  4. Amaryl 4 mg b.i.d.  5. Lasix 40 mg b.i.d.  6. Mobic 15 mg daily.  7. Quinine sulfate 260 mg b.i.d.  8. Prevacid 30 mg daily.  9. Jantoven 1 mg daily.  10.      Aspirin 325 mg daily.  11.      Caduet 5/10 one tablet daily.  12.      Multivitamin daily.  13.      Hydrocodone as needed for pain.   Family history, social history, review of systems, and physical exam, please  see the history and physical done at the time of admission.   HOSPITAL COURSE:  The patient was admitted electively  for cardiac  catheterization and this revealed significant multivessel coronary artery  disease and consultation was obtained with Dr. Tressie Stalker who evaluated  the patient and studies. His impression at time of consultation was severe  three-vessel coronary artery disease, class II-III exertional angina and  progressive symptoms, and exertional shortness of breath. Based on the  anatomy, he felt that coronary artery bypass graft was her best long-term  option. She was felt to be at significant risk of surgery and prolonged  recovery due to her comorbid conditions, but she elected to proceed with the  procedure. She was stabilized from Lisa [name] medical viewpoint. She did have Lisa [name]  urinary tract infection which was treated prior to procedure.   PROCEDURE:  On August 10, 2003, the patient was taken to the operating room  where she underwent the following procedure, coronary artery bypass grafting  times three. The following grafts were placed: (1) left internal mammary  artery to the LAD; (2) saphenous vein graft to the obtuse marginal; (3)  saphenous vein graft to the second obtuse marginal. Cross clamp time was 49  minutes, pump time was 67 minutes. The patient tolerated the  procedure well  and was taken to the surgical intensive care unit in stable condition.   HOSPITAL COURSE:  The patient had Lisa [name] somewhat prolonged postoperative course.  She had significant pain limiting mobilization. She required diuresis. Her  respiratory status required significant management and pulmonary  consultation was obtained for assistance. She did require Lisa [name] BiPAP,  respiratory adjunct with good improvement. She also developed some fevers  which required antibiotics for Lisa [name] course of cefepime and vancomycin  postoperatively. Her pulmonary status did improve over the time. She did  develop Lisa [name] Clostridium difficile colitis with diarrhea and this was treated  with Lisa [name] course of Flagyl.  She also developed Lisa [name] pleural effusion that  required thoracentesis which was performed in the radiology department on  August 17, 2003, with no complications. She showed Lisa [name] slow and continued  improvement in her colitis symptoms and hospital-acquired pneumonia., but  continued to have some debility with mobilization.  She was transferred to  the 2000 telemetry unit on August 22, 2003. Her incisions did well without  evidence of infection. Her cardiovascular status remained stable, but she  did have postoperative atrial fibrillation which was treated with  medications.  She did have some symptoms of congestive failure which were  treated also symptomatically. Her blood glucose management was stable at the  time of discharge. Her overall status was deemed to be satisfactory at time  of discharge.   MEDICATIONS ON DISCHARGE:  1. Zocor 40 mg daily.  2. Amaryl 4 mg b.i.d.  3. Cozaar 100 mg daily.  4. Aspirin 325 mg daily.  5. Combivent two puffs q.6h.  6. Flagyl 500 mg q.8h. for seven additional days.  7. Lopressor 25 mg b.i.d.  8. Niferex 150 mg daily.  9. Folic acid 1 mg daily.  10.      Lasix 40 mg daily for ten days.  11.      K-Dur 20 mEq daily for ten days. 12.      Ultram 50 mg q.6h. p.r.n.  pain.   INSTRUCTIONS:  The patient received written instructions regarding  medications, activity, diet, wound care, and follow-up. Follow-up included  home health nursing for cardiac protocol visits, also Dr. Mayford Knife two weeks  from discharge, Dr. Cornelius Moras three weeks from discharge. For pain, Ultram 50 mg  q.6h. p.r.n. The patient's most  recently hemoglobin and hematocrit dated  August 23, 2003, were 9.6 and 28.2, respectively.   CONDITION ON DISCHARGE:  Satisfactory and improving.   FINAL DIAGNOSES:  1. Coronary artery disease, multivessel with unstable angina.  2. Preoperative urinary tract infection.  3. Postoperative anemia, stable, daily for ten days.  4. Postoperative pneumonia, resolved.  5. Postoperative pleural effusions, improved.  6. Postoperative Clostridium difficile colitis, improved.  7. Postoperative atrial fibrillation, stable.  8. Hypertension.  9. Morbid obesity.  10.      Rheumatoid arthritis.  11.      Hyperlipidemia.      Rowe Clack, P.Lisa [name].-C.                    Salvatore Decent. Cornelius Moras, M.D.    Sherryll Burger  D:  10/20/2003  T:  10/21/2003  Job:  [id number]   cc:   Salvatore Decent. Cornelius Moras, M.D.  [address]  [place]  [place] [address]   Armanda Magic, M.D.  [address], [address]  [place], [place] [address]  Fax: [phone]   Shan Levans, M.D. [facility]

## 2010-11-23 NOTE — Op Note (Signed)
   NAME:  Lisa [name], Lisa [name]                        ACCOUNT NO.:  [id number]   MEDICAL RECORD NO.:  [id number]                   PATIENT TYPE:  INP   LOCATION:  2550                                 FACILITY:  [facility]   PHYSICIAN:  Myrtie Neither, M.D.                 DATE OF BIRTH:  08/04/1940   DATE OF PROCEDURE:  08/25/2002  DATE OF DISCHARGE:                                 OPERATIVE REPORT   PREOPERATIVE DIAGNOSIS:  Impingement syndrome, rotator cuff tear right  shoulder.   POSTOPERATIVE DIAGNOSIS:  Impingement syndrome, rotator cuff tear right  shoulder.   ANESTHESIA:  General.   PROCEDURE:  Acromioplasty, synovectomy, and repair of rotator cuff, right  shoulder.   DESCRIPTION OF PROCEDURE:  The patient was taken to the operating room after  given adequate preoperative medication, given general anesthesia and  intubated.  The right shoulder was prepped with Duraprep and draped in [name]  sterile manner.  The patient was in the barber chair position, the incision  was made over the right shoulder going through the skin and subcutaneous  tissue down to the deltoid.  The deltoid was then incised and released.  Hypertrophic subacromial bursa sac was resected.  There was [name] split in the  deltoid over the posterior aspect.  Anterior aspect of the deltoid was  intact.  Copious irrigation with antibiotic solution was used.  Heavy Ticron  suture was then used to reapproximate and close the defect up in the rotator  cuff.  Next, with the rasp, acromioplasty was then done, followed by copious  irrigation with antibiotic solution.  The shoulder was taken through [name] range  of motion and no apparent entrapment was noted.  Wound closure was then done  with 0 Vicryl for the deltoid, 2-0 for the subcutaneous, and skin staples  for the skin.  Immobilizing sling was then applied.  The patient will be  placed in airplane shoulder brace.  The patient tolerated the procedure  quite well and went to the  recovery room in stable and satisfactory  condition.                                               Myrtie Neither, M.D.    AC/MEDQ  D:  08/25/2002  T:  08/25/2002  Job:  [id number]

## 2010-11-23 NOTE — Consult Note (Signed)
NAMEMARQUASHA, Lisa Lisa [name]                        ACCOUNT NO.:  [id number]   MEDICAL RECORD NO.:  [id number]                   PATIENT TYPE:  INP   LOCATION:  2905                                 FACILITY:  [facility]   PHYSICIAN:  Shan Levans, M.D. [facility]            DATE OF BIRTH:  [birth year]   DATE OF CONSULTATION:  08/16/2003  DATE OF DISCHARGE:                                   CONSULTATION   REFERRING PHYSICIAN:  Armanda Magic, M.D.   CHIEF COMPLAINT:  Hypoxemia, respiratory distress, pleural effusion.   HISTORY OF PRESENT ILLNESS:  This is Lisa [name] 70 year old African American female  who is Lisa [name] from bypass surgery, initially was admitted on August 02, 2003, to the cardiology surgery with onset of shortness of breath, chest  pain with exertion.  She was brought in for heart catheterization and found  to have three vessel disease.  Postoperatively, she did well until the last  several days.  She has had increasing dyspnea, increasing chest congestion,  cough, and now fever.  Notes increased pleural effusion and increased left  lower lobe infiltrate.   PAST MEDICAL HISTORY:  1. History of adult onset diabetes since age 67.  2. History of hypertension.  3. Hypertensive heart disease.  4. Congestive failure.  5. Normal systolic function, increased LV thickness.  6. History of rheumatoid arthritis.  7. Osteoarthritis.  8. Morbid obesity.  9. Hypertension.   ALLERGIES:  No known drug allergies.   CURRENT MEDICATIONS:  1. Lopressor 12.5 mg b.i.d.  2. Protonix 40 mg daily.  3. Zocor 40 mg daily.  4. Amaryl 4 mg b.i.d.  5. Cozaar 100 mg daily.  6. Aspirin 325 mg daily.  7. Potassium supplementation.  8. Albuterol and Atrovent nebulization q.i.d.  9. Guaifenesin 1200 mg b.i.d.  10.      Lovenox 40 mg daily.  11.      Lasix 80 mg b.i.d. IV.   SOCIAL HISTORY:  The patient lives alone, has two daughters.  She currently  is retired.  She smoked, quit smoking five years ago, but  did smoke one half  to one pack Lisa [name] day for 20 years.   PAST SURGICAL HISTORY:  1. Total knee replacement on the left.  2. Back surgery in the past.  3. Right rotator cuff surgery.   FAMILY HISTORY:  Positive for coronary artery disease.   PHYSICAL EXAMINATION:  GENERAL APPEARANCE:  This is Lisa [name] African  American female in mild respiratory distress.  VITAL SIGNS:  T-max 101, blood pressure 140/50, pulse 110 sinus tachycardia,  on three liters saturation is 94%.  CHEST:  Consolidating changes left lower lung zone, dullness to percussion,  one half the way up the left, expired wheezes noted.  CARDIOVASCULAR:  Resting tachycardia without S3.  Normal S1 and S2.  ABDOMEN:  Protuberant.  Bowel sounds active.  EXTREMITIES:  No clubbing or edema.  NEUROLOGIC:  Intact.  SKIN:  Clear.   LABORATORY DATA:  Chest x-ray was reviewed and showed significant left lower  lung zone infiltrate, atelectasis and pleural effusion, cardiomegaly.   Sodium 137, potassium 4.0, chloride 97, CO2 31, BUN 9, creatinine 1.0, blood  sugar 126, calcium 8.7.  Urinalysis was unremarkable.  White count 9.1,  hemoglobin 9.0.   IMPRESSION:  This is Lisa [name] 70 year old African American female  with previous bypass surgery now with postoperative fever, increased left  lower lung infiltrate, atelectasis and left pleural effusion.  Suspect  developing hospital acquired pneumonia with peripneumonic effusion and fever  in Lisa [name] and is an ex-smoker.   RECOMMENDATIONS:  Tap effusion under ultrasound, intensify nebulizer  treatments, add cefepime and vancomycin IV.  Check blood and sputum  cultures.  Check UA.  Obtain for patient flutter valve and follow-up chest x-  ray.                                               Shan Levans, M.D. [facility]    PW/MEDQ  D:  08/16/2003  T:  08/16/2003  Job:  [id number]   cc:   Margaretmary Bayley, M.D.  [address]   [place]  [place] [address]  Fax: [phone]

## 2010-11-23 NOTE — Consult Note (Signed)
NAME:  Lisa [name], Lisa [name]                        ACCOUNT NO.:  [id number]   MEDICAL RECORD NO.:  [id number]                   PATIENT TYPE:  OIB   LOCATION:  6523                                 FACILITY:  [facility]   PHYSICIAN:  Salvatore Decent. Cornelius Moras, M.D.              DATE OF BIRTH:  1940-09-14   DATE OF CONSULTATION:  08/02/2003  DATE OF DISCHARGE:                                   CONSULTATION   REQUESTING PHYSICIAN:  Dr. Armanda Magic.   PRIMARY CARE PHYSICIAN:  Dr. Margaretmary Bayley.   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   HISTORY OF PRESENT ILLNESS:  Lisa [name] is [name] 70 year old morbidly obese  African American female with longstanding history of hypertension, type 2  diabetes mellitus, morbid obesity, rheumatoid arthritis, osteoarthritis, and  limited physical mobility.  Over the last few months, she has developed  progressive symptoms of exertional shortness of breath.  She was initially  evaluated by Dr. Chestine Spore and referred to Dr. Mayford Knife for further management.  Over the last few weeks, she has developed symptoms of exertional chest pain  described as [name] choking pain that radiates to the neck and down the left arm.  This initially occurred approximately 2 weeks ago while she was washing  clothes.  She stopped her activity and the pain resolved promptly within [name]  few minutes.  She has had [name] few more less severe episodes of similar pain  over the last few weeks, always with exertion and relieved by rest.  The  patient describes progressive symptoms of exertional shortness of breath  over the last several months.  She now gets short of breath with quite mild  activity.  She denies any symptoms of resting shortness of breath, PND,  orthopnea, or palpitations.  She has had some dizziness and dizzy spells off  and on over the last few months, although she denies any syncopal episodes.  She has had [name] mild amount of intermittent bilateral lower  extremity edema,  although this has been stable for quite some time.   She was initially evaluated by Dr. Mayford Knife on July 29, 2003.  Because of  her classical symptoms and multiple associated risk factors, she was  subsequently brought in for elective cardiac catheterization today.  This  was performed by Dr. Mayford Knife and demonstrates three-vessel coronary artery  disease with left dominant coronary circulation and preserved left  ventricular systolic function.  Cardiac surgical consultation has been  requested to consider coronary artery bypass grafting.   REVIEW OF SYSTEMS:  GENERAL:  The patient reports no appetite and, in fact,  has probably gained over 60 pounds over the last  2 years since her back  surgery.  CARDIAC:  Notable for symptoms of exertional angina and shortness  of breath as described.  RESPIRATORY:  Notable for exertional shortness of  breath.  The patient reports intermittent dry, nonproductive cough.  She  denies any productive cough, hemoptysis, wheezing.  GASTROINTESTINAL:  Negative.  The patient has no difficulty eating.  She denies problems  swallowing, although she states that occasionally she puts too much food in  her mouth and then has trouble swallowing.  She denies any bowel problems  and specifically denies any hematochezia, hematemesis, melena.  MUSCULOSKELETAL:  Notable for longstanding problems with arthritis.  The  patient has had left total knee replacement and lower back surgery.  The  patient also has had right rotator cuff surgery and she reports that her  right shoulder has been bothering her again recently.  She has limited  physical mobility, although she is able to ambulate with [name] cane.  NEUROLOGIC:  Notable for the absence of any lower extremity numbness  suggestive of diabetic nephropathy.  The patient denies any history of  transient monocular blindness or transient numbness or weakness involving  either upper or lower extremities.  She was  told by Dr. Chestine Spore that she  probably has had [name] couple of minor strokes in the past, based upon findings  of [name] MRI of the brain performed recently.  However, she denies any known  symptoms.  HEENT:  The patient reports stable eye sight.  She wears  corrective glasses but she denies any reported history of diabetic  retinopathy.  She has poor dentition with only [name] few lower teeth, although  those that are remaining, she denies any loose teeth or painful teeth.  ENDOCRINE:  Notable for type 2 diabetes mellitus.  The patient states she  checks her sugars every morning and they have not been well-controlled,  ranging anywhere from 100 to 180 or 200.  She does not know her most recent  hemoglobin A1c value.  Her lipid status is unknown.  HEMATOLOGIC:  Negative.  GENITOURINARY:  Negative.  The patient denies recent urinary frequency or  burning.  PSYCHIATRIC:  Negative, although by report, the patient has had  some mild depression since her sister passed away within the last year.   PAST MEDICAL HISTORY:  1. Morbid obesity.  2. Longstanding hypertension.  3. Type 2 diabetes mellitus.  4. Rheumatoid arthritis.  5. Osteoarthritis.  6. Depression.   PAST SURGICAL HISTORY:  1. Left total knee replacement.  2. Lower back surgery.  3. Repair of right rotator cuff as well as repair of bladder and vaginal     prolapse.   FAMILY HISTORY:  Family history is notable for the strong presence of  coronary artery disease.  The patient's older sister, mother and grandmother  all died of myocardial infarctions.   SOCIAL HISTORY:  The patient lives alone.  She has 2 daughters, 1 of whom  lives in [place].  She also has [name] granddaughter who lives in [place].  She does work.  She has [name] previous history of tobacco use, although she quit  smoking 5 years ago.  Prior to that, she smoked between 1/2 and 1 pack of cigarettes per day.  She has [name] remote history of alcohol use, although she  quite drinking  completely 8 years ago.   CURRENT MEDICATIONS:  1. Cozaar 100 mg once daily.  2. Imdur 30 mg daily.  3. Hydralazine 50 mg 3 times daily.  4. Amaryl 4 mg twice daily.  5. Lasix 40 mg twice daily.  6. Mobic 15 mg daily.  7. Quinine sulfate 260 mg twice daily.  8. Prevacid 30 mg daily.  9. Jantoven 1 mg daily.  10.  Aspirin 325 mg daily.  11.      Caduet 5/10 mg 1 tablet daily.  12.      Multivitamin daily.  13.      Hydrocodone as needed for pain.   PHYSICAL EXAM:  GENERAL:  Physical exam notable for [name] African  American female who appears her stated age in no acute distress.  VITAL SIGNS:  Blood pressure has ranged between 130 and 180 mmHg systolic  this afternoon with diastolic pressures averaging 60.  She is in sinus  rhythm and she is afebrile.  HEENT:  Exam is notable for poor dentition.  NECK:  The neck is supple.  The neck is very obese and palpation of lymph  nodes is impossible.  There are no obvious carotid bruits.  The thyroid  gland is not palpable.  CHEST:  Auscultation of the chest reveals clear breath sounds with  diminished breath sounds at both lung bases.  No wheezes or rhonchi are  noted.  CARDIOVASCULAR:  Exam includes regular rate and rhythm  No murmurs, rubs, or  gallops are noted.  ABDOMEN:  The abdomen is extremely obese but soft and nontender.  Bowel  sounds are present.  There is no sign of any skin breakdown under her  panniculus.  EXTREMITIES:  The extremities are warm and adequately perfused.  There is no  lower extremity edema.  There is no sign of obvious venous insufficiency.  Distal pulses are not palpable on either lower leg at the ankle.  RECTAL AND GU:  Exams are both deferred.  NEUROLOGIC:  Examination is grossly nonfocal and symmetrical throughout.   DIAGNOSTIC TEST:  Cardiac catheterization performed today by Dr. Mayford Knife is  reviewed.  This reveals three-vessel coronary artery disease with left-  dominant coronary  circulation and preserved left ventricular systolic  function.  There is long-segment 70-80% stenosis of the mid left anterior  descending coronary artery.  There is 70-80% stenosis of the mid left  circumflex coronary artery arising just at take-off after [name] large first  circumflex marginal branch.  There is 70-80% stenosis of the midportion of  the first circumflex marginal branch.  The distal left circumflex coronary  artery gives rise to [name] couple of very tiny posterolateral branches.  The  right coronary artery is occluded proximally, although appears to be small  and nondominant.   IMPRESSION:  Severe three-vessel coronary artery disease with class II to  class III exertional angina and progressive symptoms of exertional shortness  of breath, which is likely related to [name] combination of angina, hypertensive  heart disease with diastolic dysfunction, and extreme morbid obesity.  Based upon coronary anatomy, I suspect that coronary artery bypass grafting would  provide the best long-term option for treatment of coronary artery disease.  However, the operative risks with surgery will be very high and recovery  prolonged due to the patient's co-morbid conditions, especially her morbid  obesity with limited physical mobility.  Percutaneous coronary intervention  potentially could be [name] reasonable alternative, although she has somewhat  calcified vessels and with underlying diabetes, she would be at increased  risk of premature restenosis or recurrent coronary artery disease or  myocardial infarction.  Regardless of treatment option for coronary artery  disease, the patient's long-term prognosis will remain guarded at best,  unless her other medical conditions can be aggressively treated.   PLAN:  I have outlined issues at length with Mrs. Pixley, her daughter,  and one of her granddaughters.  Alternative  treatment strategies have been  discussed.  They understand all associated risks of  surgical intervention  including, but not limited to, risks of death, stroke, myocardial  infarction, congestive heart failure, respiratory  failure, pneumonia, bleeding requiring blood transfusion, arrhythmia,  infection, wound healing or wound breakdown, recurrent coronary artery  disease.  All of their questions have been addressed.  They desire to think  options over further.  We could proceed with elective coronary artery bypass  grafting next week if desired.                                               Salvatore Decent. Cornelius Moras, M.D.    CHO/MEDQ  D:  08/02/2003  T:  08/03/2003  Job:  [id number]   cc:   Armanda Magic, M.D.  [address], [address]  [place], [place] [address]  Fax: [phone]   Margaretmary Bayley, M.D.  [address], [address]  [place]  [place] [address]  Fax: [phone]

## 2010-11-23 NOTE — Op Note (Signed)
NAME:  Lisa [name], Lisa [name]                        ACCOUNT NO.:  [id number]   MEDICAL RECORD NO.:  [id number]                   PATIENT TYPE:  OIB   LOCATION:  6523                                 FACILITY:  [facility]   PHYSICIAN:  Armanda Magic, M.D.                  DATE OF BIRTH:  09-14-1940   DATE OF PROCEDURE:  08/02/2003  DATE OF DISCHARGE:                                 OPERATIVE REPORT   REFERRING PHYSICIAN:  Dr. Margaretmary Bayley.   PROCEDURE:  1. Left heart catheterization.  2. Coronary angiography.  3. Left ventriculography.   OPERATOR:  Armanda Magic, M.D.   COMPLICATIONS:  None.   IV ACCESS:  The right femoral artery with 6 French sheath.   [name] 70 year old, obese, black female, who has [name] history of diabetes  mellitus, hypertension, hypertensive heart disease, who presented to me  initially with shortness of breath and chest pain with exertion.  Due to her  multiple cardiac risk factors, she now presents for cardiac catheterization.  The patient was brought to the cardiac catheterization laboratory in the  fasting, nonsedated state.  Informed consent was obtained.   The patient was connected to continuous heart rate and pulse oximetry  monitoring and intermittent blood pressure monitoring.  The right groin was  prepped and draped in [name] sterile fashion.  Xylocaine 1% was used for local  anesthesia.  Using the modified Seldinger technique, [name] 6 French sheath was  placed in the right femoral artery.  Under fluoroscopic guidance, [name] 6 [place]  JL-4 catheter was placed in the left coronary artery.  Multiple cine films  were taken at [name] 30-degree RAO and 40 degree LA views.  This catheter was  then exchanged out of her guidewire for [name] 6 [place] JR-4 catheter which was  placed under fluoroscopic guidance into the right coronary artery.  Multiple  cine films were taken 30-degree RAO and 40-degree LA views. The catheter was  then exchanged over the guidewire for [name] 6 French angled  pigtail catheter  which was placed under fluoroscopic guidance into the left ventricular  cavity.  Left ventriculography was performed in the 30-degree RAO view using  [name] total of 30 mL of contrast at 15 mL per second.  The catheter was then  pulled back across the aortic valve and no significant gradient was noted.  At the end of the procedure, all catheters and sheaths were removed.  Medical __________ was performed until adequate hemostasis was obtained. The  patient was transferred to her room in stable condition.  Of note, we did  have to give her one sublingual nitroglycerin for elevated blood pressure  prior to sheath pull.   RESULTS:  1. Left main coronary artery is widely patent and calcified and bifurcates     into the left anterior descending artery and left circumflex artery.  2. Left anterior descending artery gives rise to [name] first  diagonal branch and     then has [name] 78% long narrowing between the first and second diagonals. The     second and third diagonals then take off and the rest of the LAD is     patent to the apex.  The left circumflex is patent proximally and then     gives rise to [name] first obtuse marginal branch. At the bifurcation, there     was [name] 70% narrowing in the left circumflex. The rest of the left     circumflex is widely patent throughout the course.  The obtuse marginal     branch is [name] large branch with [name] mid 70% narrowing.  The right coronary     artery was occluded proximally. There is evidence of faint right     collaterals to the distal RCA.  3. Left ventriculography shows hyperdynamic left ventricular function with     mid cavity obliteration during systole. LV EDP 30 mmHg, LV 180/18 mmHg,     aortic pressure 184/79 mmHg.   ASSESSMENT:  1. Three vessel obstructive coronary artery disease.  2. Normal left ventricular systolic function.  3. Elevated left ventricular end-diastolic pressure.   PLAN:  CVTS consult, IV fluids, discontinue Mobic.   Continue Imdur, change  Cozaar to Diovan 160 mg [name] day, hydralazine and Lasix for blood pressure and  Coreg 6.25 mg b.i.d.                                               Armanda Magic, M.D.    TT/MEDQ  D:  08/02/2003  T:  08/02/2003  Job:  [id number]   cc:   Margaretmary Bayley, M.D.  [address], [address]  [place]  [place] [address]  Fax: [phone]

## 2010-11-23 NOTE — Op Note (Signed)
NAME:  Lisa Lisa [name], Lisa Lisa [name]                        ACCOUNT NO.:  [id number]   MEDICAL RECORD NO.:  [id number]                   PATIENT TYPE:  INP   LOCATION:  2305                                 FACILITY:  [facility]   PHYSICIAN:  Salvatore Decent. Cornelius Moras, M.D.              DATE OF BIRTH:  Dec 17, 1940   DATE OF PROCEDURE:  08/10/2003  DATE OF DISCHARGE:                                 OPERATIVE REPORT   PREOPERATIVE DIAGNOSIS:  Severe three vessel coronary artery disease.   POSTOPERATIVE DIAGNOSIS:  Severe three vessel coronary artery disease.   PROCEDURE:  Median sternotomy for coronary artery bypass grafting x 3 (left  internal mammary artery to distal left anterior descending coronary artery,  saphenous vein graft to the circumflex marginal branch, medial subbranch,  saphenous vein graft to circumflex marginal branch, lateral subbranch).   SURGEON:  Salvatore Decent. Cornelius Moras, M.D.   ASSISTANT:  Lisa Lisa [name], P.Lisa [name].   ANESTHESIA:  General.   INDICATIONS:  The patient is Lisa [name] 70 year old morbidly obese African American  female with Lisa [name] history of hypertension, type 2 diabetes mellitus,  hyperlipidemia, severe arthritis and limited mobility.  She presents with  new onset symptoms of shortness of breath with exertion.  Stress Cardiolite  exam is abnormal.  Cardiac catheterization performed by Dr. Carolanne Grumbling  demonstrates severe three vessel coronary artery disease with preserved left  ventricular function.  Lisa [name] full consultation note has been dictated  previously.   OPERATIVE CONSENT:  The patient and her family have been counseled at length  regarding the indications and potential benefits of coronary artery bypass  grafting. Alternative treatment strategies have been discussed.  The  associated risks of surgery have been reviewed, and the patient accepts all  associated risks including, but not limited to, risks of death, stroke,  myocardial infarction, congestive heart failure, respiratory failure,  pneumonia, bleeding requiring blood transfusion, arrhythmia, infection,  failure of wound healing, recurrent coronary artery disease, exacerbation of  symptoms in her shoulders related to degenerative disease in her shoulders  and chronic shoulder pain.  All of the questions have been addressed. She  desires to proceed with surgery as described.   DESCRIPTION OF PROCEDURE:  The patient was brought to the operating room on  the above mentioned date and invasive hemodynamic monitoring was established  by the anesthesia service under the care and direction of Dr. Arta Bruce.  Specifically, Lisa [name] Swan-Ganz catheter was placed through the right internal  jugular approach.  Lisa [name] radial arterial line was placed.  Intravenous  antibiotics were administered.  Following the induction of general  endotracheal anesthesia, Lisa [name] Foley catheter was placed.  The patient's chest,  abdomen, both groins, and both lower extremities are prepared and draped in  Lisa [name] sterile manner.   Lisa [name] median sternotomy incision was performed, and the left internal mammary  artery was dissected from the chest wall and prepared for bypass grafting.  The left internal mammary artery  is Lisa [name] good quality conduit.  Simultaneously,  Lisa [name] saphenous vein was obtained from the patient's right thigh in the upper  portion of the right lower leg using endoscopic vein harvest technique.  The  saphenous vein is Lisa [name] good quality conduit.  The patient was heparinized  systemically.   The pericardium was opened.  The ascending aorta is normal in appearance.  The ascending aorta and the right atrium are  cannulated for coronary artery bypass.  Adequate heparinization is verified.   Cardiopulmonary bypass was begun and the surface of the heart was inspected.  Distal sites were selected for coronary bypass grafting.  Of note, the  patient has left dominant coronary circulation.  The posterior lateral  branches of the distal left circumflex coronary artery are  very tiny and are  too small for grafting.  Portions of the saphenous graft and the left  internal mammary artery are trimmed to the appropriate length.  The  temperature probe was placed in the left ventricular septum.  Lisa [name] cardioplegia  cannula was placed in the ascending aorta.   The patient was allowed to cool to 32 degrees systemic temperature.  The  aortic cross clamp was applied and cardioplegia was delivered in antegrade  fashion through the aortic root.  Iced saline slush was applied for topical  hypothermia.  The initial cardioplegic arrest and myocardial cooling were  felt to be excellent. Repeat doses of cardioplegia were administered  intermittently throughout the cross clamp portion of the operation both down  the aortic root and down the subsequently placed vein grafts to maintain  septal temperature below 15 degrees Centigrade.   The following distal coronary bypass anastomoses were performed:   1. The circumflex marginal branch bifurcates and forms two subbranches.  The     medial subbranch of the circumflex marginal branch is grafted with Lisa [name]     saphenous vein graft in an end-to-side fashion.  This coronary artery     measures 1.2 mm in diameter and is of poor quality.  2. The lateral subbranch of the circumflex marginal branch is grafted with Lisa [name]     saphenous vein graft in an end-to-side fashion.  This coronary measures     2.0 mm in diameter and is of fair quality.  3. Distal left anterior descending coronary artery is grafted with Lisa [name] left     internal mammary artery in an end-to-side fashion.  This coronary     measures 1.5 mm in diameter and is of fair to poor quality.  The vessel     is diffusely diseased proximally and distally and this is the best site     for grafting.  Lisa [name] 1.5 probe will pass in both directions.  4. Both proximal saphenous vein anastomoses are performed directly to the    ascending aorta prior to removal of the aortic cross clamp.  The septal      temperature is noted to rise rapidly upon reperfusion of the left     internal mammary artery.  All air is evacuated from the aortic root.  The     aortic cross clamp was removed after total cross clamp time of 49     minutes.   The heart began to beat spontaneously without need for cardioversion. All  proximal and distal anastomoses are inspected for any hemostasis and  appropriate graft orientation.  Epicardial pacing wires were affixed to the  right ventricular outflow tract and to the right atrial appendage.  The  patient  was rewarmed to 37 degrees Centigrade temperature.  The patient was  weaned from cardiopulmonary bypass without difficulty.  The patient's rhythm  at separation from bypass is sinus bradycardia.  The atrial pacing is  employed to increase the heart rate.  No inotropic support is required . The  total cardiopulmonary bypass time for the operation is 67 minutes.   The venous and arterial cannulae were removed uneventfully.  Protamine was  administered to reverse the anticoagulation.  The mediastinum and left chest  are irrigated with saline solution containing vancomycin.  Meticulous  hemostasis was ascertained.  The mediastinum and the left chest are drained  with three chest tubes placed through separate stab incisions inferiorly.  The median sternotomy was closed in Lisa [name] routine fashion although double-  strength sternal wires were used to close the sternum and the subcutaneous  tissues are closed with interrupted sutures due to the patient's morbid  obesity.  The right lower extremity incisions were closed in multiple layers  and all skin incisions are closed with subcuticular skin closures.   The patient tolerated the procedure well and was transported to the surgical  intensive care unit in stable condition.  There are no intraoperative  complications.  All sponge, instrument and needle counts are verified  correct at the completion of the operation.  No blood  products were  administered.                                               Salvatore Decent. Cornelius Moras, M.D.    CHO/MEDQ  D:  08/10/2003  T:  08/10/2003  Job:  [id number]   cc:   Armanda Magic, M.D.  [address], [address]  [place], [place] [address]  Fax: [phone]   Margaretmary Bayley, M.D.  [address]  [place]  [place] [address]  Fax: [phone]

## 2010-11-23 NOTE — Discharge Summary (Signed)
NAME:  Lisa [name], Lisa [name]                       ACCOUNT NO.:  [id number]   MEDICAL RECORD NO.:  [id number]                   PATIENT TYPE:  INP   LOCATION:                                       FACILITY:  [facility]   PHYSICIAN:  Margaretmary Bayley, M.D.                 DATE OF BIRTH:  March 24, 1941   DATE OF ADMISSION:  05/17/2003  DATE OF DISCHARGE:  05/20/2003                                 DISCHARGE SUMMARY   DISCHARGE DIAGNOSES:  1. Congestive heart failure.  2. Hypertensive heart disease with congestive heart failure.  3. Rheumatoid arthritis.  4. Osteoarthritis.  5. Exogenous obesity.  6. Non-insulin-dependent type 2 diabetes mellitus.  7. Hypertension.   REASON FOR ADMISSION:  [name] is a 70 year old hypertensive, type 2  diabetic who presents to the emergency room with a 7-10 day history of  progressive dyspnea on exertion and over the last two to three nights  paroxysmal nocturnal dyspnea and orthopnea.  The patient was seen in the ER  where she was treated with parenteral Lasix and a nitroglycerin drip and  subsequently admitted for further treatment and evaluation.   PERTINENT PHYSICAL FINDINGS:  GENERAL:  She is a well-developed, obese  African-American woman with a modest amount of respiratory distress.  VITAL SIGNS:  Blood pressure 128/84, pulse rate 66, respiratory rate 24,  temperature 98.2.  NECK:  She had positive jugular venous distention.  HEENT:  Her pharynx was without any exudates.  CHEST:  There is no splinting and no focal tenderness.  She has diffuse  inspiratory/expiratory wheezes with scattered rhonchi throughout both lung  fields.  No consolidation.  HEART:  There is an S4 gallop, but no S3 and no significant murmurs or rubs.  ABDOMEN:  Obese, soft, nontender.  EXTREMITIES:  2+ edema of both ankles pretibial areas with some induration  and hyperpigmentation.  There is no clubbing, no cyanosis, no calf  tenderness, and negative Homan's sign.  BACK:   There is no CVA tenderness.   PERTINENT LABORATORY DATA:  Arterial blood gas on 3 L of O2:  pO2 69 with a  pCO2 of 42 and pH of 7.44.  White blood count was 7000 with hematocrit of  35.  Her INR is 1 with a PTT of 31.  Her metabolic panel was normal with the  exception of an increase in her glucose to 154 and an albumin slightly low  at 3.4.  Her CK levels ranged from 3.1-4 with a relative index of 2.2-1.8.  Troponin levels were normal.  Urinalysis was unremarkable except for a few  bacteria.  Her EKG revealed a normal sinus rhythm, no deviation of  electrical axis, and nonspecific repolarization abnormalities.  There was no  acute ST-T wave changes.  Chest x-ray was remarkable for interstitial edema,  but no acute infiltrates.  No mass lesions were noted.  A spiral CT scan  done revealed no  evidence of a pulmonary embolus.   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  The patient was admitted to a telemetry bed where she was  continued on aggressive diuretic therapy, O2 supplementation, prophylactic  Lovenox therapy, and albuterol/Atrovent nebulizers for her bronchospasm.  Serial cardiac enzymes and EKGs did not reveal any evidence suggestive of  acute myocardial infarction.  With the use of both pre and after load  reduction patient was asymptomatic within the first 48 hours of her  hospitalization.  Her 2-D echocardiogram revealed left ventricular muscular  enlargement but her systolic function was normal.  She had an ejection  fraction of 55-65%.  She had increase in wall thickness but regional wall  motion could not be evaluated from the technical standpoint.   Follow-up  chest x-ray revealed complete clearing of her interstitial edema.  A repeat  arterial blood gas on room air revealed a pO2 of 60.   DISCHARGE MEDICATIONS:  1. Cozaar 100 mg daily  2. Imdur 30 mg daily.  3. Hydralazine 50 mg t.i.d.  4. Amaryl 4 mg b.i.d.  5. Furosemide 40 mg b.i.d.  6. She was asked to hold her Avandamet.  7. She was instructed to stop her Bextra and to contact Myrtie Neither, M.D.     for other treatments for her arthritis.   DIET:  The patient is discharged on a 3 g sodium, 1400 calorie carbohydrate  modified diet.   FOLLOWUP:  She is scheduled to be seen in my office in two weeks and  scheduled to be seen by Myrtie Neither, M.D. in four weeks.   DISCHARGE INSTRUCTIONS:  The patient is instructed to continue the use of  her calcium and vitamin D supplements.   PROGNOSIS:  Her prognosis is considered to be fair.                                                Margaretmary Bayley, M.D.    PC/MEDQ  D:  06/09/2003  T:  06/09/2003  Job:  [id number]

## 2011-03-28 ENCOUNTER — Ambulatory Visit
Admission: RE | Admit: 2011-03-28 | Discharge: 2011-03-28 | Disposition: A | Discharge status: Home / Self Care | Payer: Medicare Other | Source: Ambulatory Visit | Attending: Orthopedic Surgery | Admitting: Orthopedic Surgery

## 2011-03-28 ENCOUNTER — Other Ambulatory Visit: Payer: Self-pay | Admitting: Orthopedic Surgery

## 2011-03-28 DIAGNOSIS — S8002XA Contusion of left knee, initial encounter: Secondary | ICD-10-CM

## 2011-03-28 DIAGNOSIS — S8001XA Contusion of right knee, initial encounter: Secondary | ICD-10-CM

## 2011-04-15 LAB — URINALYSIS, ROUTINE W REFLEX MICROSCOPIC
Glucose, UA: NEGATIVE
Ketones, ur: 15 — AB
Specific Gravity, Urine: 1.023
pH: 5

## 2011-04-15 LAB — PROTIME-INR
INR: 1.7 — ABNORMAL HIGH
INR: 1.8 — ABNORMAL HIGH
INR: 1.8 — ABNORMAL HIGH
INR: 4.1 — ABNORMAL HIGH
INR: 4.2 — ABNORMAL HIGH
Prothrombin Time: 21.7 — ABNORMAL HIGH
Prothrombin Time: 41.4 — ABNORMAL HIGH
Prothrombin Time: 42.4 — ABNORMAL HIGH

## 2011-04-15 LAB — WOUND CULTURE

## 2011-04-15 LAB — URINE MICROSCOPIC-ADD ON

## 2011-04-15 LAB — CBC
HCT: 22.3 — ABNORMAL LOW
HCT: 33.5 — ABNORMAL LOW
MCHC: 32.4
MCHC: 32.5
MCV: 92.9
Platelets: 370
Platelets: 431 — ABNORMAL HIGH
RBC: 2.08 — ABNORMAL LOW
RBC: 3.61 — ABNORMAL LOW
RDW: 15.2
RDW: 15.4
WBC: 7.9

## 2011-04-15 LAB — BASIC METABOLIC PANEL
CO2: 29
GFR calc non Af Amer: >60
Glucose, Bld: 54 — ABNORMAL LOW
Potassium: 3.8
Sodium: 141

## 2011-04-15 LAB — CROSSMATCH
ABO/RH(D): A POS
DAT, IgG: NEGATIVE

## 2011-04-15 LAB — RETICULOCYTES
RBC.: 2.4 — ABNORMAL LOW
Retic Count, Absolute: 50.4

## 2011-04-15 LAB — COMPREHENSIVE METABOLIC PANEL
Albumin: 2.1 — ABNORMAL LOW
BUN: 21
Calcium: 8.2 — ABNORMAL LOW
Creatinine, Ser: 1.37 — ABNORMAL HIGH
Glucose, Bld: 112 — ABNORMAL HIGH
Total Protein: 5.6 — ABNORMAL LOW

## 2011-04-15 LAB — APTT: aPTT: 76 — ABNORMAL HIGH

## 2011-04-16 LAB — CBC
HCT: 27.3 — ABNORMAL LOW
HCT: 29.9 — ABNORMAL LOW
HCT: 34.6 — ABNORMAL LOW
HCT: 34.5 — ABNORMAL LOW
Hemoglobin: 10.1 — ABNORMAL LOW
Hemoglobin: 11.8 — ABNORMAL LOW
Hemoglobin: 9.3 — ABNORMAL LOW
Hemoglobin: 11.7 — ABNORMAL LOW
MCHC: 33.7
MCHC: 33.9
MCV: 94.1
MCV: 91.8
Platelets: 182
Platelets: 295
RBC: 3.17 — ABNORMAL LOW
RBC: 3.75 — ABNORMAL LOW
RBC: 3.75 — ABNORMAL LOW
RDW: 14.6
RDW: 15.3
RDW: 15.2 — ABNORMAL HIGH
WBC: 20.8 — ABNORMAL HIGH
WBC: 7.5

## 2011-04-16 LAB — URINALYSIS, ROUTINE W REFLEX MICROSCOPIC
Bilirubin Urine: NEGATIVE
Bilirubin Urine: NEGATIVE
Glucose, UA: NEGATIVE
Glucose, UA: NEGATIVE
Ketones, ur: NEGATIVE
Protein, ur: NEGATIVE
Specific Gravity, Urine: 1.011
Urobilinogen, UA: 0.2
pH: 5.5

## 2011-04-16 LAB — COMPREHENSIVE METABOLIC PANEL
ALT: 45 — ABNORMAL HIGH
ALT: 20
AST: 25
Albumin: 3.6
Alkaline Phosphatase: 59
Alkaline Phosphatase: 57
BUN: 36 — ABNORMAL HIGH
BUN: 42 — ABNORMAL HIGH
CO2: 23
CO2: 23
Calcium: 9.5
Chloride: 102
Creatinine, Ser: 1.91 — ABNORMAL HIGH
GFR calc Af Amer: 32 — ABNORMAL LOW
GFR calc non Af Amer: 42 — ABNORMAL LOW
GFR calc non Af Amer: 26 — ABNORMAL LOW
Glucose, Bld: 140 — ABNORMAL HIGH
Glucose, Bld: 131 — ABNORMAL HIGH
Potassium: 4.7
Potassium: 5.4 — ABNORMAL HIGH
Sodium: 135
Total Bilirubin: 0.7
Total Bilirubin: 0.6
Total Protein: 7.2
Total Protein: 7.8

## 2011-04-16 LAB — URINE CULTURE: Colony Count: 15000

## 2011-04-16 LAB — DIFFERENTIAL
Basophils Absolute: 0.1
Basophils Relative: 1
Eosinophils Absolute: 0.1
Eosinophils Relative: 2
Lymphocytes Relative: 29
Lymphs Abs: 2.2
Monocytes Absolute: 0.5
Monocytes Relative: 7
Neutro Abs: 4.6
Neutrophils Relative %: 61

## 2011-04-16 LAB — URINE MICROSCOPIC-ADD ON

## 2011-04-16 LAB — TYPE AND SCREEN
ABO/RH(D): A POS
DAT, IgG: NEGATIVE

## 2011-04-16 LAB — HEMOGLOBIN A1C
Hgb A1c MFr Bld: 6.2 — ABNORMAL HIGH
Mean Plasma Glucose: 143

## 2011-04-16 LAB — PROTIME-INR
INR: 1.1
INR: 1.1
INR: 1.7 — ABNORMAL HIGH
INR: 2.7 — ABNORMAL HIGH
INR: 3.7 — ABNORMAL HIGH
Prothrombin Time: 14
Prothrombin Time: 20.9 — ABNORMAL HIGH
Prothrombin Time: 29.2 — ABNORMAL HIGH
Prothrombin Time: 38.1 — ABNORMAL HIGH

## 2011-04-17 ENCOUNTER — Other Ambulatory Visit: Payer: Self-pay | Admitting: Internal Medicine

## 2011-04-17 DIAGNOSIS — N200 Calculus of kidney: Secondary | ICD-10-CM

## 2011-04-18 LAB — COMPREHENSIVE METABOLIC PANEL
ALT: 20
AST: 25
CO2: 25
Calcium: 9.5
Chloride: 102
GFR calc non Af Amer: 28 — ABNORMAL LOW
Glucose, Bld: 131 — ABNORMAL HIGH
Sodium: 135
Total Bilirubin: 0.7

## 2011-04-18 LAB — URINALYSIS, ROUTINE W REFLEX MICROSCOPIC
Bilirubin Urine: NEGATIVE
Glucose, UA: NEGATIVE
Ketones, ur: NEGATIVE
Protein, ur: NEGATIVE
pH: 5.5

## 2011-04-18 LAB — CBC
Hemoglobin: 12.1
MCHC: 33.4
MCV: 91.4
RBC: 3.95
WBC: 7.4

## 2011-04-18 LAB — URINE MICROSCOPIC-ADD ON

## 2011-04-18 LAB — PROTIME-INR: Prothrombin Time: 14.2

## 2011-04-30 ENCOUNTER — Ambulatory Visit (HOSPITAL_COMMUNITY)
Admission: RE | Admit: 2011-04-30 | Discharge: 2011-04-30 | Disposition: A | Discharge status: Home / Self Care | Payer: Medicare Other | Source: Ambulatory Visit | Attending: Internal Medicine | Admitting: Internal Medicine

## 2011-04-30 DIAGNOSIS — N3080 Other cystitis without hematuria: Secondary | ICD-10-CM | POA: Insufficient documentation

## 2011-04-30 DIAGNOSIS — N200 Calculus of kidney: Secondary | ICD-10-CM | POA: Insufficient documentation

## 2011-04-30 DIAGNOSIS — Q619 Cystic kidney disease, unspecified: Secondary | ICD-10-CM | POA: Insufficient documentation

## 2011-04-30 DIAGNOSIS — M5137 Other intervertebral disc degeneration, lumbosacral region: Secondary | ICD-10-CM | POA: Insufficient documentation

## 2011-04-30 DIAGNOSIS — R109 Unspecified abdominal pain: Secondary | ICD-10-CM | POA: Insufficient documentation

## 2011-04-30 DIAGNOSIS — M51379 Other intervertebral disc degeneration, lumbosacral region without mention of lumbar back pain or lower extremity pain: Secondary | ICD-10-CM | POA: Insufficient documentation

## 2011-10-30 ENCOUNTER — Other Ambulatory Visit: Payer: Self-pay

## 2011-10-30 DIAGNOSIS — R102 Pelvic and perineal pain: Secondary | ICD-10-CM

## 2012-03-10 ENCOUNTER — Ambulatory Visit
Admission: RE | Admit: 2012-03-10 | Discharge: 2012-03-10 | Disposition: A | Discharge status: Home / Self Care | Payer: Medicare Other | Source: Ambulatory Visit | Attending: Orthopedic Surgery | Admitting: Orthopedic Surgery

## 2012-03-10 ENCOUNTER — Other Ambulatory Visit: Payer: Self-pay | Admitting: Orthopedic Surgery

## 2012-03-10 DIAGNOSIS — R52 Pain, unspecified: Secondary | ICD-10-CM

## 2012-05-11 ENCOUNTER — Emergency Department (HOSPITAL_COMMUNITY): Payer: Medicare Other

## 2012-05-11 ENCOUNTER — Emergency Department (HOSPITAL_COMMUNITY)
Admission: EM | Admit: 2012-05-11 | Discharge: 2012-05-11 | Disposition: A | Discharge status: Home / Self Care | Payer: Medicare Other | Attending: Emergency Medicine | Admitting: Emergency Medicine

## 2012-05-11 ENCOUNTER — Encounter (HOSPITAL_COMMUNITY): Payer: Self-pay | Admitting: *Deleted

## 2012-05-11 DIAGNOSIS — Z87891 Personal history of nicotine dependence: Secondary | ICD-10-CM | POA: Insufficient documentation

## 2012-05-11 DIAGNOSIS — E119 Type 2 diabetes mellitus without complications: Secondary | ICD-10-CM | POA: Insufficient documentation

## 2012-05-11 DIAGNOSIS — I2510 Atherosclerotic heart disease of native coronary artery without angina pectoris: Secondary | ICD-10-CM | POA: Insufficient documentation

## 2012-05-11 DIAGNOSIS — Z79899 Other long term (current) drug therapy: Secondary | ICD-10-CM | POA: Insufficient documentation

## 2012-05-11 DIAGNOSIS — I10 Essential (primary) hypertension: Secondary | ICD-10-CM | POA: Insufficient documentation

## 2012-05-11 DIAGNOSIS — Z792 Long term (current) use of antibiotics: Secondary | ICD-10-CM | POA: Insufficient documentation

## 2012-05-11 DIAGNOSIS — I509 Heart failure, unspecified: Secondary | ICD-10-CM | POA: Insufficient documentation

## 2012-05-11 DIAGNOSIS — N390 Urinary tract infection, site not specified: Secondary | ICD-10-CM | POA: Insufficient documentation

## 2012-05-11 DIAGNOSIS — Y939 Activity, unspecified: Secondary | ICD-10-CM | POA: Insufficient documentation

## 2012-05-11 DIAGNOSIS — W19XXXA Unspecified fall, initial encounter: Secondary | ICD-10-CM

## 2012-05-11 DIAGNOSIS — Z7982 Long term (current) use of aspirin: Secondary | ICD-10-CM | POA: Insufficient documentation

## 2012-05-11 DIAGNOSIS — R4182 Altered mental status, unspecified: Secondary | ICD-10-CM | POA: Insufficient documentation

## 2012-05-11 DIAGNOSIS — Y929 Unspecified place or not applicable: Secondary | ICD-10-CM | POA: Insufficient documentation

## 2012-05-11 DIAGNOSIS — W1809XA Striking against other object with subsequent fall, initial encounter: Secondary | ICD-10-CM | POA: Insufficient documentation

## 2012-05-11 DIAGNOSIS — Z9861 Coronary angioplasty status: Secondary | ICD-10-CM | POA: Insufficient documentation

## 2012-05-11 HISTORY — DX: Type 2 diabetes mellitus without complications: E11.9

## 2012-05-11 HISTORY — DX: Atherosclerotic heart disease of native coronary artery without angina pectoris: I25.10

## 2012-05-11 HISTORY — DX: Essential (primary) hypertension: I10

## 2012-05-11 LAB — CBC WITH DIFFERENTIAL/PLATELET
Basophils Relative: 0 % (ref 0–1)
HCT: 34.1 % — ABNORMAL LOW (ref 36.0–46.0)
Hemoglobin: 11.5 g/dL — ABNORMAL LOW (ref 12.0–15.0)
Lymphocytes Relative: 15 % (ref 12–46)
Lymphs Abs: 1.5 10*3/uL (ref 0.7–4.0)
Monocytes Absolute: 0.5 10*3/uL (ref 0.1–1.0)
Monocytes Relative: 5 % (ref 3–12)
Neutro Abs: 7.5 10*3/uL (ref 1.7–7.7)
Neutrophils Relative %: 78 % — ABNORMAL HIGH (ref 43–77)
RBC: 3.52 MIL/uL — ABNORMAL LOW (ref 3.87–5.11)
WBC: 9.6 10*3/uL (ref 4.0–10.5)

## 2012-05-11 LAB — URINALYSIS, ROUTINE W REFLEX MICROSCOPIC
Bilirubin Urine: NEGATIVE
Glucose, UA: NEGATIVE mg/dL
Ketones, ur: NEGATIVE mg/dL
Nitrite: NEGATIVE
Specific Gravity, Urine: 1.014 (ref 1.005–1.030)
pH: 6 (ref 5.0–8.0)

## 2012-05-11 LAB — BASIC METABOLIC PANEL
BUN: 14 mg/dL (ref 6–23)
CO2: 26 mEq/L (ref 19–32)
Chloride: 104 mEq/L (ref 96–112)
Creatinine, Ser: 1.17 mg/dL — ABNORMAL HIGH (ref 0.50–1.10)
GFR calc Af Amer: 52 mL/min — ABNORMAL LOW (ref >90)
Potassium: 4.1 mEq/L (ref 3.5–5.1)

## 2012-05-11 LAB — URINE MICROSCOPIC-ADD ON

## 2012-05-11 MED ORDER — DEXTROSE 5 % IV SOLN
1.0000 g | Freq: Once | INTRAVENOUS | Status: DC
  Filled 2012-05-11: qty 10

## 2012-05-11 MED ORDER — CEPHALEXIN 500 MG PO CAPS: 500.0000 mg | ORAL_CAPSULE | Freq: Four times a day (QID) | ORAL | Status: DC

## 2012-05-11 MED ORDER — LIDOCAINE HCL (PF) 1 % IJ SOLN
INTRAMUSCULAR | Status: AC
  Administered 2012-05-11: 5 mL
  Filled 2012-05-11: qty 5

## 2012-05-11 MED ORDER — CEFTRIAXONE SODIUM 1 G IJ SOLR
1.0000 g | Freq: Once | INTRAMUSCULAR | Status: AC
  Administered 2012-05-11: 1 g via INTRAMUSCULAR
  Filled 2012-05-11: qty 10

## 2012-05-11 NOTE — ED Notes (Signed)
Pt fell and hit head on the wall Thursday, and Friday started becoming confused.  Saturday nite called the police and thought she could not get home.  Pt then hearing grandkids in house.  Pt has days mixed up.  Urine is dark.  No blood thinners.  Pt family md thought she needs CT scan.

## 2012-05-11 NOTE — ED Notes (Signed)
Pt complains of left sided headache.  Pt knows year and knows hospital and appropriate.

## 2012-05-11 NOTE — ED Provider Notes (Signed)
History     CSN: [id number]  Arrival date & time 05/11/12  1039   First MD Initiated Contact with Patient 05/11/12 1243      Chief Complaint  Patient presents with  . Fall    (Consider location/radiation/quality/duration/timing/severity/associated sxs/prior treatment) HPI Pt presenting after fall several days ago.  Family states she has been c/o headaches since the fall.  Over the weekend family also states she has had some worsening confusion from her baseline.  No fever/chills, no vomiting.  NO chest pain or sob.  No focal weakness or changes in vision or speech.  There are no other associated systemic symptoms, there are no other alleviating or modifying factors.   Past Medical History  Diagnosis Date  . Diabetes mellitus without complication   . Hypertension   . CHF (congestive heart failure)   . Coronary artery disease     Past Surgical History  Procedure Date  . Stents     cardiac  . Cardiac surgery   . Knee surgery     bilateral  . Back surgery   . Rotator cuff surgery     No family history on file.  History  Substance Use Topics  . Smoking status: Former Games developer  . Smokeless tobacco: Not on file  . Alcohol Use: No    OB History    Grav Para Term Preterm Abortions TAB SAB Ect Mult Living                  Review of Systems ROS reviewed and all otherwise negative except for mentioned in HPI  Allergies  Review of patient's allergies indicates no known allergies.  Home Medications   Current Outpatient Rx  Name  Route  Sig  Dispense  Refill  . IPRATROPIUM-ALBUTEROL 18-103 MCG/ACT IN AERO   Inhalation   Inhale 2 puffs into the lungs every 6 (six) hours as needed. Shortness of breath         . ALLOPURINOL 100 MG PO TABS   Oral   Take 100 mg by mouth daily.         Marland Kitchen AMLODIPINE BESYLATE-VALSARTAN 10-320 MG PO TABS   Oral   Take 1 tablet by mouth daily.         . ASPIRIN 325 MG PO TBEC   Oral   Take 325 mg by mouth daily.         .  BUPROPION HCL ER (XL) 150 MG PO TB24   Oral   Take 150 mg by mouth daily.         Marland Kitchen CILOSTAZOL 100 MG PO TABS   Oral   Take 100 mg by mouth 2 (two) times daily.         Marland Kitchen CLOTRIMAZOLE-BETAMETHASONE 1-0.05 % EX LOTN   Topical   Apply 1 application topically 2 (two) times daily. To rash at breast and groin area as directed         . ESOMEPRAZOLE MAGNESIUM 40 MG PO CPDR   Oral   Take 40 mg by mouth daily before breakfast.         . FLUTICASONE PROPIONATE  HFA 220 MCG/ACT IN AERO   Inhalation   Inhale 1-2 puffs into the lungs 2 (two) times daily.         Marland Kitchen GABAPENTIN 300 MG PO CAPS   Oral   Take 300 mg by mouth 3 (three) times daily.         Marland Kitchen HYDROCODONE-ACETAMINOPHEN 7.5-750 MG PO TABS  Oral   Take 1 tablet by mouth every 8 (eight) hours as needed. pain         . IMIPRAMINE HCL 10 MG PO TABS   Oral   Take 50 mg by mouth at bedtime.         . NYSTATIN-TRIAMCINOLONE 100000-0.1 UNIT/GM-% EX CREA   Topical   Apply 1 application topically 2 (two) times daily. To rash at groin and breast area as directed         . SYSTANE BALANCE OP   Ophthalmic   Apply 1 drop to eye 4 (four) times daily as needed. Dry eyes         . TRAZODONE HCL 50 MG PO TABS   Oral   Take 50-150 mg by mouth at bedtime as needed. As needed for sleep.  May increase to three tablets if needed (150mg )         . CEPHALEXIN 500 MG PO CAPS   Oral   Take 1 capsule (500 mg total) by mouth 4 (four) times daily.   28 capsule   0     BP 132/74  Pulse 90  Temp 98.2 F (36.8 C) (Oral)  Resp 18  SpO2 98% Vitals reviewed Physical Exam Physical Examination: General appearance - alert, well appearing, and in no distress Mental status - alert, oriented to person, place, not to time Head- NCAT Eyes - pupils equal and reactive, extraocular eye movements intact Mouth - mucous membranes moist, pharynx normal without lesions Chest - clear to auscultation, no wheezes, rales or rhonchi,  symmetric air entry Heart - normal rate, regular rhythm, normal S1, S2, no murmurs, rubs, clicks or gallops Abdomen - soft, nontender, nondistended, no masses or organomegaly Neurological - alert, oriented, normal speech, no focal findings, cranial nerves 2-12 grossly intact, strength in extremities x 4 symmetric, sensation intact Extremities - peripheral pulses normal, no pedal edema, no clubbing or cyanosis Skin - normal coloration and turgor, no rashes/abrasions/bruising Psych- pleasant, normal affect  ED Course  Procedures (including critical care time)  Labs Reviewed  CBC WITH DIFFERENTIAL - Abnormal; Notable for the following:    RBC 3.52 (*)     Hemoglobin 11.5 (*)     HCT 34.1 (*)     Neutrophils Relative 78 (*)     All other components within normal limits  BASIC METABOLIC PANEL - Abnormal; Notable for the following:    Glucose, Bld 109 (*)     Creatinine, Ser 1.17 (*)     GFR calc non Af Amer 45 (*)     GFR calc Af Amer 52 (*)     All other components within normal limits  URINALYSIS, ROUTINE W REFLEX MICROSCOPIC - Abnormal; Notable for the following:    Leukocytes, UA SMALL (*)     All other components within normal limits  URINE MICROSCOPIC-ADD ON - Abnormal; Notable for the following:    Squamous Epithelial / LPF FEW (*)     Bacteria, UA FEW (*)     All other components within normal limits  GLUCOSE, CAPILLARY  URINE CULTURE   Ct Head Wo Contrast  05/11/2012  *RADIOLOGY REPORT*  Clinical Data: Headache, recent fall and head trauma.  CT HEAD WITHOUT CONTRAST  Technique:  Contiguous axial images were obtained from the base of the skull through the vertex without contrast.  Comparison: 06/22/2009  Findings: Prominence of the sulci, cisterns, and ventricles, in keeping with volume loss. There are subcortical and periventricular white matter hypodensities, a  nonspecific finding most often seen with chronic microangiopathic changes.  There is no evidence for acute  hemorrhage, overt hydrocephalus, mass lesion, or abnormal extra-axial fluid collection.  No definite CT evidence for acute cortical based (large artery) infarction. Remote bilateral lacunar infarctions including within the right caudate head and left lentiform nucleus. The visualized paranasal sinuses and mastoid air cells are predominately clear.  No displaced calvarial fracture.  IMPRESSION: White matter changes and remote lacunar infarctions.  No CT evidence of acute intracranial abnormality.   Original Report Authenticated By: Jearld Lesch, M.D.      1. Altered mental status   2. Fall   3. Urinary tract infection       MDM  Pt presenting with confusion per familly after fall several days ago.  Head CT without any acute abnormalities.  Labs reveal UTI- pt given dose of rocephin in the ED, will discharge with rx for keflex.  Discharged with strict return precautions.  Pt agreeable with plan.        Ethelda Chick, MD 05/12/12 1010

## 2012-05-12 LAB — URINE CULTURE
Colony Count: NO GROWTH
Culture: NO GROWTH

## 2012-10-15 ENCOUNTER — Encounter (HOSPITAL_COMMUNITY): Payer: Self-pay | Admitting: Emergency Medicine

## 2012-10-15 ENCOUNTER — Emergency Department (HOSPITAL_COMMUNITY): Payer: Medicare Other

## 2012-10-15 ENCOUNTER — Emergency Department (HOSPITAL_COMMUNITY)
Admission: EM | Admit: 2012-10-15 | Discharge: 2012-10-15 | Disposition: A | Discharge status: Home / Self Care | Payer: Medicare Other | Attending: Emergency Medicine | Admitting: Emergency Medicine

## 2012-10-15 DIAGNOSIS — S0093XA Contusion of unspecified part of head, initial encounter: Secondary | ICD-10-CM

## 2012-10-15 DIAGNOSIS — S0003XA Contusion of scalp, initial encounter: Secondary | ICD-10-CM | POA: Insufficient documentation

## 2012-10-15 DIAGNOSIS — S0993XA Unspecified injury of face, initial encounter: Secondary | ICD-10-CM | POA: Insufficient documentation

## 2012-10-15 DIAGNOSIS — S199XXA Unspecified injury of neck, initial encounter: Secondary | ICD-10-CM | POA: Insufficient documentation

## 2012-10-15 DIAGNOSIS — S0990XA Unspecified injury of head, initial encounter: Secondary | ICD-10-CM | POA: Insufficient documentation

## 2012-10-15 DIAGNOSIS — I2510 Atherosclerotic heart disease of native coronary artery without angina pectoris: Secondary | ICD-10-CM | POA: Insufficient documentation

## 2012-10-15 DIAGNOSIS — W010XXA Fall on same level from slipping, tripping and stumbling without subsequent striking against object, initial encounter: Secondary | ICD-10-CM | POA: Insufficient documentation

## 2012-10-15 DIAGNOSIS — S46909A Unspecified injury of unspecified muscle, fascia and tendon at shoulder and upper arm level, unspecified arm, initial encounter: Secondary | ICD-10-CM | POA: Insufficient documentation

## 2012-10-15 DIAGNOSIS — I509 Heart failure, unspecified: Secondary | ICD-10-CM | POA: Insufficient documentation

## 2012-10-15 DIAGNOSIS — S4980XA Other specified injuries of shoulder and upper arm, unspecified arm, initial encounter: Secondary | ICD-10-CM | POA: Insufficient documentation

## 2012-10-15 DIAGNOSIS — Z79899 Other long term (current) drug therapy: Secondary | ICD-10-CM | POA: Insufficient documentation

## 2012-10-15 DIAGNOSIS — Y939 Activity, unspecified: Secondary | ICD-10-CM | POA: Insufficient documentation

## 2012-10-15 DIAGNOSIS — Z87891 Personal history of nicotine dependence: Secondary | ICD-10-CM | POA: Insufficient documentation

## 2012-10-15 DIAGNOSIS — I10 Essential (primary) hypertension: Secondary | ICD-10-CM | POA: Insufficient documentation

## 2012-10-15 DIAGNOSIS — M5030 Other cervical disc degeneration, unspecified cervical region: Secondary | ICD-10-CM | POA: Insufficient documentation

## 2012-10-15 DIAGNOSIS — Z7982 Long term (current) use of aspirin: Secondary | ICD-10-CM | POA: Insufficient documentation

## 2012-10-15 DIAGNOSIS — W19XXXA Unspecified fall, initial encounter: Secondary | ICD-10-CM

## 2012-10-15 DIAGNOSIS — E119 Type 2 diabetes mellitus without complications: Secondary | ICD-10-CM | POA: Insufficient documentation

## 2012-10-15 DIAGNOSIS — S0083XA Contusion of other part of head, initial encounter: Secondary | ICD-10-CM | POA: Insufficient documentation

## 2012-10-15 DIAGNOSIS — M542 Cervicalgia: Secondary | ICD-10-CM

## 2012-10-15 DIAGNOSIS — Y92009 Unspecified place in unspecified non-institutional (private) residence as the place of occurrence of the external cause: Secondary | ICD-10-CM | POA: Insufficient documentation

## 2012-10-15 MED ORDER — HYDROCODONE-ACETAMINOPHEN 5-325 MG PO TABS: 1.0000 | ORAL_TABLET | Freq: Four times a day (QID) | ORAL | Status: DC | PRN

## 2012-10-15 MED ORDER — PREDNISONE 20 MG PO TABS
60.0000 mg | ORAL_TABLET | Freq: Once | ORAL | Status: AC
  Administered 2012-10-15: 60 mg via ORAL
  Filled 2012-10-15: qty 3

## 2012-10-15 MED ORDER — HYDROCODONE-ACETAMINOPHEN 5-325 MG PO TABS
1.0000 | ORAL_TABLET | Freq: Once | ORAL | Status: AC
  Administered 2012-10-15: 1 via ORAL
  Filled 2012-10-15: qty 1

## 2012-10-15 MED ORDER — PREDNISONE 20 MG PO TABS: ORAL_TABLET | ORAL | Status: DC

## 2012-10-15 NOTE — ED Notes (Signed)
Pt c/o neck pain and upper back pain with radiation down left arm; pt sts worse with movement and started 3 days ago but worse today

## 2012-10-15 NOTE — ED Notes (Signed)
Pt. Having increased pain to her shoulders and arms.   Pt. Reports having arthritis.  Checked her vitals are WNL.  Will continue to monitor.   Wrapped pt. In warm blankets

## 2012-10-15 NOTE — ED Provider Notes (Addendum)
History     CSN: [id number]  Arrival date & time 10/15/12  1239   First MD Initiated Contact with Patient 10/15/12 1608      Chief Complaint  Patient presents with  . Neck Pain  . Arm Pain    (Consider location/radiation/quality/duration/timing/severity/associated sxs/prior treatment) Patient is a 72 y.o. female presenting with neck pain and arm pain. The history is provided by the patient.  Neck Pain Associated symptoms: headaches   Associated symptoms: no chest pain, no fever, no numbness and no weakness   Arm Pain Associated symptoms include headaches. Pertinent negatives include no chest pain, no abdominal pain and no shortness of breath.  pt c/o head and neck pain s/p fall 1 week ago. Dull, moderate. Worse w certain movements and palpation.  States tripped/stumbled, falling against closet door 1 week ago. No loc. +dazed. C/o pain back of head and neck, radiating to left arm since fall. No hx chronic neck pain. No numbness/weakness. No loss of function. No hx ddd. Mechanical fall, denies any faintness or dizziness. States otherwise health at baseline. No fever or chills. Normal appetite. No cough or uri c/o. No cp or sob. Denies any other pain or injury.   Past Medical History  Diagnosis Date  . Diabetes mellitus without complication   . Hypertension   . CHF (congestive heart failure)   . Coronary artery disease     Past Surgical History  Procedure Laterality Date  . Stents      cardiac  . Cardiac surgery    . Knee surgery      bilateral  . Back surgery    . Rotator cuff surgery      History reviewed. No pertinent family history.  History  Substance Use Topics  . Smoking status: Former Games developer  . Smokeless tobacco: Not on file  . Alcohol Use: No    OB History   Grav Para Term Preterm Abortions TAB SAB Ect Mult Living                  Review of Systems  Constitutional: Negative for fever and chills.  HENT: Positive for neck pain. Negative for sore throat.    Eyes: Negative for redness.  Respiratory: Negative for shortness of breath.   Cardiovascular: Negative for chest pain, palpitations and leg swelling.  Gastrointestinal: Negative for nausea, vomiting and abdominal pain.  Genitourinary: Negative for dysuria and flank pain.  Musculoskeletal: Negative for back pain.  Skin: Negative for rash and wound.  Neurological: Positive for headaches. Negative for syncope, weakness and numbness.  Hematological: Does not bruise/bleed easily.  Psychiatric/Behavioral: Negative for confusion.    Allergies  Review of patient's allergies indicates no known allergies.  Home Medications   Current Outpatient Rx  Name  Route  Sig  Dispense  Refill  . albuterol-ipratropium (COMBIVENT) 18-103 MCG/ACT inhaler   Inhalation   Inhale 2 puffs into the lungs every 6 (six) hours as needed. Shortness of breath         . allopurinol (ZYLOPRIM) 100 MG tablet   Oral   Take 100 mg by mouth daily.         Marland Kitchen amLODipine-valsartan (EXFORGE) 10-320 MG per tablet   Oral   Take 1 tablet by mouth daily.         . Ascorbic Acid (VITAMIN C) 1000 MG tablet   Oral   Take 1,000 mg by mouth daily.         Marland Kitchen aspirin 325 MG EC tablet  Oral   Take 325 mg by mouth daily.         . benzonatate (TESSALON) 200 MG capsule   Oral   Take 200 mg by mouth 3 (three) times daily as needed for cough.         Marland Kitchen buPROPion (WELLBUTRIN XL) 150 MG 24 hr tablet   Oral   Take 150 mg by mouth daily.         . cilostazol (PLETAL) 100 MG tablet   Oral   Take 100 mg by mouth 2 (two) times daily.         . clotrimazole-betamethasone (LOTRISONE) lotion   Topical   Apply 1 application topically 2 (two) times daily. To rash at breast and groin area as directed         . esomeprazole (NEXIUM) 40 MG capsule   Oral   Take 40 mg by mouth daily before breakfast.         . fluticasone (FLOVENT HFA) 220 MCG/ACT inhaler   Inhalation   Inhale 1-2 puffs into the lungs 2  (two) times daily.         Marland Kitchen gabapentin (NEURONTIN) 300 MG capsule   Oral   Take 300 mg by mouth 3 (three) times daily.         Marland Kitchen HYDROcodone-acetaminophen (VICODIN ES) 7.5-750 MG per tablet   Oral   Take 1 tablet by mouth every 8 (eight) hours as needed. pain         . imipramine (TOFRANIL) 10 MG tablet   Oral   Take 50 mg by mouth at bedtime.         Marland Kitchen nystatin-triamcinolone (MYCOLOG II) cream   Topical   Apply 1 application topically 2 (two) times daily. To rash at groin and breast area as directed         . traZODone (DESYREL) 50 MG tablet   Oral   Take 50-150 mg by mouth at bedtime as needed. As needed for sleep.  May increase to three tablets if needed (150mg )           BP 160/89  Pulse 87  Temp(Src) 98.6 F (37 C) (Oral)  Resp 18  SpO2 97%  Physical Exam  Nursing note and vitals reviewed. Constitutional: She is oriented to person, place, and time. She appears well-developed and well-nourished. No distress.  HENT:  Mouth/Throat: Oropharynx is clear and moist.  Tenderness/contusion posterior scalp.   Eyes: Conjunctivae are normal. Pupils are equal, round, and reactive to light. No scleral icterus.  Neck: Neck supple. No tracheal deviation present.  Mid cervical tenderness, and left lateral/trap muscular tenderness.   Cardiovascular: Normal rate, regular rhythm, normal heart sounds and intact distal pulses.   Pulmonary/Chest: Effort normal and breath sounds normal. No respiratory distress.  Abdominal: Soft. Normal appearance and bowel sounds are normal. She exhibits no distension. There is no tenderness.  Genitourinary:  No cva tenderness  Musculoskeletal: Normal range of motion. She exhibits no edema and no tenderness.  Good rom bil ext, no focal bony tenderness Mid cervical tenderness otherwise, CTLS spine, non tender, aligned, no step off.   Neurological: She is alert and oriented to person, place, and time.  Motor intact bil.   Skin: Skin is warm  and dry. No rash noted.  Psychiatric: She has a normal mood and affect.    ED Course  Procedures (including critical care time)  Ct Head Wo Contrast  10/15/2012  *RADIOLOGY REPORT*  Clinical Data:  Fall,  neck pain  CT HEAD WITHOUT CONTRAST CT CERVICAL SPINE WITHOUT CONTRAST  Technique:  Multidetector CT imaging of the head and cervical spine was performed following the standard protocol without intravenous contrast.  Multiplanar CT image reconstructions of the cervical spine were also generated.  Comparison:  CT head dated 05/11/2012  CT HEAD  Findings: No evidence of parenchymal hemorrhage or extra-axial fluid collection. No mass lesion, mass effect, or midline shift.  No CT evidence of acute infarction.  Subcortical white matter and periventricular small vessel ischemic changes.  Intracranial atherosclerosis.  Mild age related atrophy.  No ventriculomegaly.  The visualized paranasal sinuses are essentially clear. The mastoid air cells are unopacified.  No evidence of calvarial fracture.  IMPRESSION: No evidence of acute intracranial abnormality.  Small vessel ischemic changes with intracranial atherosclerosis.  CT CERVICAL SPINE  Findings: Reversal of the normal cervical lordosis.  No evidence of fracture dislocation.  Vertebral body heights are maintained.  The dens appears intact.  No prevertebral soft tissue swelling.  Moderate multilevel degenerative changes.  Visualized thyroid is mildly heterogeneous.  Visualized lung apices are notable for suspected emphysematous changes.  IMPRESSION: No evidence of traumatic injury to the cervical spine.  Moderate multilevel degenerative changes.   Original Report Authenticated By: Charline Bills, M.D.    Ct Cervical Spine Wo Contrast  10/15/2012  *RADIOLOGY REPORT*  Clinical Data:  Fall, neck pain  CT HEAD WITHOUT CONTRAST CT CERVICAL SPINE WITHOUT CONTRAST  Technique:  Multidetector CT imaging of the head and cervical spine was performed following the  standard protocol without intravenous contrast.  Multiplanar CT image reconstructions of the cervical spine were also generated.  Comparison:  CT head dated 05/11/2012  CT HEAD  Findings: No evidence of parenchymal hemorrhage or extra-axial fluid collection. No mass lesion, mass effect, or midline shift.  No CT evidence of acute infarction.  Subcortical white matter and periventricular small vessel ischemic changes.  Intracranial atherosclerosis.  Mild age related atrophy.  No ventriculomegaly.  The visualized paranasal sinuses are essentially clear. The mastoid air cells are unopacified.  No evidence of calvarial fracture.  IMPRESSION: No evidence of acute intracranial abnormality.  Small vessel ischemic changes with intracranial atherosclerosis.  CT CERVICAL SPINE  Findings: Reversal of the normal cervical lordosis.  No evidence of fracture dislocation.  Vertebral body heights are maintained.  The dens appears intact.  No prevertebral soft tissue swelling.  Moderate multilevel degenerative changes.  Visualized thyroid is mildly heterogeneous.  Visualized lung apices are notable for suspected emphysematous changes.  IMPRESSION: No evidence of traumatic injury to the cervical spine.  Moderate multilevel degenerative changes.   Original Report Authenticated By: Charline Bills, M.D.        MDM  vicodin po. Ct.  Reviewed nursing notes and prior charts for additional history.   Ct neg acute.  Pt w left neck/trap muscular tenderness and pain w lat rotation/turning neck that reproduces her pain/symptoms.  No numbness/weakness.  Discussed ct w pt, ?ddd/nerve impingement. Will add pred rx.    Discussed need close pcp f/u.  On recheck, hr 90 rr 16.         Suzi Roots, MD 10/15/12 1827  Suzi Roots, MD 10/15/12 [phone]

## 2012-12-18 ENCOUNTER — Emergency Department (HOSPITAL_COMMUNITY)
Admission: EM | Admit: 2012-12-18 | Discharge: 2012-12-18 | Disposition: A | Discharge status: Home / Self Care | Payer: Medicare Other | Attending: Emergency Medicine | Admitting: Emergency Medicine

## 2012-12-18 ENCOUNTER — Encounter (HOSPITAL_COMMUNITY): Payer: Self-pay

## 2012-12-18 ENCOUNTER — Emergency Department (HOSPITAL_COMMUNITY): Payer: Medicare Other

## 2012-12-18 DIAGNOSIS — I509 Heart failure, unspecified: Secondary | ICD-10-CM | POA: Insufficient documentation

## 2012-12-18 DIAGNOSIS — Z87891 Personal history of nicotine dependence: Secondary | ICD-10-CM | POA: Insufficient documentation

## 2012-12-18 DIAGNOSIS — Z79899 Other long term (current) drug therapy: Secondary | ICD-10-CM | POA: Insufficient documentation

## 2012-12-18 DIAGNOSIS — M47812 Spondylosis without myelopathy or radiculopathy, cervical region: Secondary | ICD-10-CM | POA: Insufficient documentation

## 2012-12-18 DIAGNOSIS — E119 Type 2 diabetes mellitus without complications: Secondary | ICD-10-CM | POA: Insufficient documentation

## 2012-12-18 DIAGNOSIS — Z9861 Coronary angioplasty status: Secondary | ICD-10-CM | POA: Insufficient documentation

## 2012-12-18 DIAGNOSIS — Z7982 Long term (current) use of aspirin: Secondary | ICD-10-CM | POA: Insufficient documentation

## 2012-12-18 DIAGNOSIS — IMO0002 Reserved for concepts with insufficient information to code with codable children: Secondary | ICD-10-CM | POA: Insufficient documentation

## 2012-12-18 DIAGNOSIS — M5030 Other cervical disc degeneration, unspecified cervical region: Secondary | ICD-10-CM | POA: Insufficient documentation

## 2012-12-18 DIAGNOSIS — I10 Essential (primary) hypertension: Secondary | ICD-10-CM | POA: Insufficient documentation

## 2012-12-18 DIAGNOSIS — I2510 Atherosclerotic heart disease of native coronary artery without angina pectoris: Secondary | ICD-10-CM | POA: Insufficient documentation

## 2012-12-18 DIAGNOSIS — Z87828 Personal history of other (healed) physical injury and trauma: Secondary | ICD-10-CM | POA: Insufficient documentation

## 2012-12-18 MED ORDER — HYDROCODONE-ACETAMINOPHEN 5-325 MG PO TABS: 2.0000 | ORAL_TABLET | Freq: Four times a day (QID) | ORAL | Status: DC | PRN

## 2012-12-18 MED ORDER — HYDROCODONE-ACETAMINOPHEN 5-325 MG PO TABS
2.0000 | ORAL_TABLET | Freq: Once | ORAL | Status: AC
  Administered 2012-12-18: 2 via ORAL
  Filled 2012-12-18: qty 2

## 2012-12-18 NOTE — ED Provider Notes (Signed)
Complains of severe bilateral neck pain for one week worse with moving her neck.. No treatment prior to coming here. She denies leg pain. Denies focal numbness or weakness.  Doug Sou, MD 12/18/12 [phone]

## 2012-12-18 NOTE — ED Notes (Signed)
Pt presents from home with a fall x 2 months ago, pt seen here for same and discharged.  Pt reports intermittent neck pain after fall, reports this episode x 1 week ago.  Pt presents with philly collar that she reports "makes it feel better".  Pt reports neck pain radiates down her back and down both arms, denies any recent injury.

## 2012-12-18 NOTE — ED Provider Notes (Signed)
History    This chart was scribed for non-physician practitioner Magnus Sinning, PA-C working with Doug Sou, MD by Toya Smothers, ED Scribe. This patient was seen in room TR08C/TR08C and the patient's care was started at 6:12 PM.   CSN: [id number]  Arrival date & time 12/18/12  1503   First MD Initiated Contact with Patient 12/18/12 1530      No chief complaint on file.   The history is provided by the patient. No language interpreter was used.   HPI Comments: Lisa [name] is a 72 y.o. female with h/o cervical degenerative disc disease as evident on CT scan from April, who presents to the Emergency Department complaining of 4 days of recurrent, constant diffuse neck pain. Pain is severe, shooting, radiates "just below elbows," is worse with movement, and is alleviated by nothing. Pt reports that she fell in April, though despite having leg pain, she has been fine until 4 days ago. Pt denies having any leg pain now. Pt denies headache, diaphoresis, fever, chills, nausea, vomiting, diarrhea, weakness, cough, SOB and any other pain. Pt is a former smoker, denying alcohol and illicit drug use.  Orthopedist: Dr Montez Morita PCP: Dr. Chestine Spore  Past Medical History  Diagnosis Date  . Diabetes mellitus without complication   . Hypertension   . CHF (congestive heart failure)   . Coronary artery disease     Past Surgical History  Procedure Laterality Date  . Stents      cardiac  . Cardiac surgery    . Knee surgery      bilateral  . Back surgery    . Rotator cuff surgery      History reviewed. No pertinent family history.  History  Substance Use Topics  . Smoking status: Former Games developer  . Smokeless tobacco: Not on file  . Alcohol Use: No    Review of Systems  HENT: Positive for neck pain.   All other systems reviewed and are negative.    Allergies  Review of patient's allergies indicates no known allergies.  Home Medications   Current Outpatient Rx  Name  Route  Sig   Dispense  Refill  . albuterol-ipratropium (COMBIVENT) 18-103 MCG/ACT inhaler   Inhalation   Inhale 2 puffs into the lungs every 6 (six) hours as needed for wheezing or shortness of breath.          . allopurinol (ZYLOPRIM) 100 MG tablet   Oral   Take 100 mg by mouth daily.         Marland Kitchen amLODipine-valsartan (EXFORGE) 10-320 MG per tablet   Oral   Take 1 tablet by mouth daily.         . Ascorbic Acid (VITAMIN C) 1000 MG tablet   Oral   Take 1,000 mg by mouth daily.         Marland Kitchen aspirin 325 MG EC tablet   Oral   Take 325 mg by mouth daily.         Marland Kitchen buPROPion (WELLBUTRIN XL) 150 MG 24 hr tablet   Oral   Take 150 mg by mouth daily.         . cilostazol (PLETAL) 100 MG tablet   Oral   Take 100 mg by mouth 2 (two) times daily.         . clotrimazole-betamethasone (LOTRISONE) lotion   Topical   Apply 1 application topically 2 (two) times daily. To rash at breast and groin area as directed         .  esomeprazole (NEXIUM) 40 MG capsule   Oral   Take 40 mg by mouth daily before breakfast.         . fluticasone (FLOVENT HFA) 220 MCG/ACT inhaler   Inhalation   Inhale 1-2 puffs into the lungs 2 (two) times daily.         Marland Kitchen gabapentin (NEURONTIN) 300 MG capsule   Oral   Take 300 mg by mouth 3 (three) times daily.         Marland Kitchen HYDROcodone-acetaminophen (NORCO/VICODIN) 5-325 MG per tablet   Oral   Take 1-2 tablets by mouth every 6 (six) hours as needed for pain.         Marland Kitchen HYDROcodone-acetaminophen (VICODIN ES) 7.5-750 MG per tablet   Oral   Take 1 tablet by mouth every 8 (eight) hours as needed. pain         . imipramine (TOFRANIL) 10 MG tablet   Oral   Take 50 mg by mouth at bedtime.         Marland Kitchen nystatin-triamcinolone (MYCOLOG II) cream   Topical   Apply 1 application topically 2 (two) times daily. To rash at groin and breast area as directed         . traZODone (DESYREL) 50 MG tablet   Oral   Take 50-150 mg by mouth at bedtime as needed. As needed  for sleep.  May increase to three tablets if needed (150mg )           BP 156/92  Pulse 85  Temp(Src) 98.3 F (36.8 C) (Oral)  Resp 20  SpO2 97%  Physical Exam  Nursing note and vitals reviewed. Constitutional: She is oriented to person, place, and time. She appears well-developed and well-nourished. No distress.  HENT:  Head: Normocephalic and atraumatic.  Eyes: EOM are normal.  Neck: Neck supple. No tracheal deviation present.  Entire C-spine is tender to palpation Limited ROM secondary to pain  Cardiovascular: Normal rate.   Pulmonary/Chest: Effort normal. No respiratory distress.  Musculoskeletal: Normal range of motion.  Grip strength is 5/5 bialterally  Neurological: She is alert and oriented to person, place, and time. She has normal reflexes. She displays normal reflexes. No cranial nerve deficit.  Sensation is intact bilaterally Brachioradialis reflex is 2+ bilaterally  Skin: Skin is warm and dry.  Psychiatric: She has a normal mood and affect. Her behavior is normal.    ED Course  Procedures DIAGNOSTIC STUDIES: Oxygen Saturation is 97% on room air, normal by my interpretation.    COORDINATION OF CARE: 16:00- Evaluated Pt. Pt is awake, alert, and without distress. 16:07- Patient understands and agrees with initial ED impression and plan with expectations set for ED visit. 16:08- Ordered DG Cervical Spine Complete 1 time imaging. 18:12- Discussed results from CT. Pt reports that she is feeling mildly improved after treatment and observation.    Labs Reviewed - No data to display Dg Cervical Spine Complete  12/18/2012   *RADIOLOGY REPORT*  Clinical Data: Neck pain. The patient reports following and April 2014.  CERVICAL SPINE - COMPLETE 4+ VIEW  Comparison: CT cervical spine 10/15/2012  Findings: Cervical spine vertebral bodies are normal in alignment from the skull base through the cervicothoracic junction. Vertebral body heights appear preserved and stable.   Moderate disc space narrowing and osteophyte formation is present at C3-4, C4-5, C5-6, and C6-7.  No fracture is identified.  Mild bony neural foraminal narrowing is seen on the left at C4-5, C5-6, and C6-7.  Slight bony neural foraminal  narrowing is seen on the right at C5- C6.  Prevertebral soft tissue contour is within normal limits.  Trachea is midline.  Median sternotomy wires are noted.  IMPRESSION:  1.  Moderate multilevel degenerative disc disease appears similar to prior CT of the cervical spine. 2.  No acute bony abnormality identified.  Normal prevertebral soft tissue contour.   Original Report Authenticated By: Britta Mccreedy, M.D.   Ct Cervical Spine Wo Contrast  12/18/2012   *RADIOLOGY REPORT*  Clinical Data: Neck pain.  Fall April 2014.  CT CERVICAL SPINE WITHOUT CONTRAST  Technique:  Multidetector CT imaging of the cervical spine was performed. Multiplanar CT image reconstructions were also generated.  Comparison: Radiograph 12/18/2012.  Findings: Straightening of the normal cervical lordosis.  Severe cervical spondylosis.  No cervical spine fracture or dislocation is present.  There is a mild dextroconvex curvature of the cervicothoracic spine.  Craniocervical alignment is normal.  The occipital condyles and odontoid are intact.  Paraspinal soft tissues demonstrate carotid atherosclerosis.  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  Lung apices are within normal limits.  Partially visualized median sternotomy wires. Calcification of the transverse ligament of the atlas is noted.  C2-C3:  Calcified central disc protrusion and ossified posterior longitudinal ligament.  No bony foraminal stenosis.  Prominent vascular channel is present in the right C3 lamina which is well corticated.  C3-C4:  Mild central stenosis associated with left eccentric disc osteophyte complex.  Left greater than right foraminal stenosis due to uncovertebral spurring and facet  arthrosis.  C4-C5: Moderate central stenosis associated with disc osteophyte complex.  Left greater than right foraminal stenosis due to uncovertebral spurring.  C5-C6:  Bilateral left greater than right foraminal stenosis due to uncovertebral spurring.  Mild central stenosis associated with broad-based disc osteophyte complex.  C6-C7:  Left greater than right foraminal stenosis due to uncovertebral spurring.  Mild central stenosis associated with broad-based disc osteophyte complex.  C7-T1:  Mild central stenosis due to disc osteophyte complex. Bilateral foraminal stenosis due to uncovertebral spurring.  The discs are severely degenerated, most pronounced from C4-C5 through C7-T1.  Degenerative cysts in the vertebral bodies are present adjacent to the disc spaces.  IMPRESSION: Severe multilevel cervical spondylosis.  No cervical spine fracture or dislocation.   Original Report Authenticated By: Andreas Newport, M.D.     No diagnosis found.    MDM  Patient presents with a chief complaint of neck pain.  Neurovascularly intact.  CT showing severe cervical spondylosis.  Patient discharged home with pain medications.   I personally performed the services described in this documentation, which was scribed in my presence. The recorded information has been reviewed and is accurate.    Pascal Lux Bassett, PA-C 12/24/12 1500

## 2012-12-29 NOTE — ED Provider Notes (Signed)
Medical screening examination/treatment/procedure(s) were conducted as a shared visit with non-physician practitioner(s) and myself.  I personally evaluated the patient during the encounter  Doug Sou, MD 12/29/12 [phone]

## 2013-01-29 ENCOUNTER — Ambulatory Visit
Admission: RE | Admit: 2013-01-29 | Discharge: 2013-01-29 | Disposition: A | Discharge status: Home / Self Care | Payer: Medicare Other | Source: Ambulatory Visit | Attending: Orthopedic Surgery | Admitting: Orthopedic Surgery

## 2013-01-29 ENCOUNTER — Other Ambulatory Visit: Payer: Self-pay | Admitting: Orthopedic Surgery

## 2013-01-29 DIAGNOSIS — R52 Pain, unspecified: Secondary | ICD-10-CM

## 2013-05-05 ENCOUNTER — Encounter: Payer: Self-pay | Admitting: *Deleted

## 2013-05-06 ENCOUNTER — Encounter: Payer: Self-pay | Admitting: Cardiology

## 2013-05-06 ENCOUNTER — Ambulatory Visit (INDEPENDENT_AMBULATORY_CARE_PROVIDER_SITE_OTHER): Payer: Medicare Other | Admitting: Cardiology

## 2013-05-06 VITALS — BP 124/60 | HR 50 | Ht 62.0 in | Wt 218.0 lb

## 2013-05-06 DIAGNOSIS — E785 Hyperlipidemia, unspecified: Secondary | ICD-10-CM

## 2013-05-06 DIAGNOSIS — I10 Essential (primary) hypertension: Secondary | ICD-10-CM

## 2013-05-06 DIAGNOSIS — I5032 Chronic diastolic (congestive) heart failure: Secondary | ICD-10-CM

## 2013-05-06 DIAGNOSIS — I509 Heart failure, unspecified: Secondary | ICD-10-CM

## 2013-05-06 DIAGNOSIS — G4733 Obstructive sleep apnea (adult) (pediatric): Secondary | ICD-10-CM

## 2013-05-06 DIAGNOSIS — I5189 Other ill-defined heart diseases: Secondary | ICD-10-CM

## 2013-05-06 DIAGNOSIS — I2510 Atherosclerotic heart disease of native coronary artery without angina pectoris: Secondary | ICD-10-CM

## 2013-05-06 DIAGNOSIS — I519 Heart disease, unspecified: Secondary | ICD-10-CM

## 2013-05-06 NOTE — Patient Instructions (Signed)
Your physician recommends that you continue on your current medications as directed. Please refer to the Current Medication list given to you today.  Your physician recommends that you return for lab work in: One week on Nov 6th 2014 for your fasting lab work  Your physician has recommended that you have a sleep study. This test records several body functions during sleep, including: brain activity, eye movement, oxygen and carbon dioxide blood levels, heart rate and rhythm, breathing rate and rhythm, the flow of air through your mouth and nose, snoring, body muscle movements, and chest and belly movement.  Your physician wants you to follow-up in: 6 months with Dr Sherlyn Lick will receive a reminder letter in the mail two months in advance. If you don't receive a letter, please call our office to schedule the follow-up appointment.

## 2013-05-06 NOTE — Progress Notes (Signed)
[address] [place], [place]  [address] Phone: [phone] Fax:  [phone]  Date:  05/06/2013   ID:  Lisa [name], DOB 01-07-1941, MRN [id number]  PCP:  Laurena Slimmer, MD  Cardiologist:  Armanda Magic, MD     History of Present Illness: Lisa [name] is a 73 y.o. female with a history of CAD, HTN, dyslipidemia, diastolic dysfunction, chronic diastolic CHF .  She is doing well.  She denies any SOB, DOE, palpitations, dizziness or syncope.  She has occasional LE edema which is stable.  She has occasional chest pain on the right side of her chest that she describes as a pressure discomfort and it occurs with deep breathing or cough only.  It is not like her angina.  It occurs sporadically.  She also complains of falling asleep during the day and inability to stay awake.  She apparently was diagnosed with OSA in the past but never underwent CPAP titration.   Wt Readings from Last 3 Encounters:  05/06/13 218 lb (98.884 kg)     Past Medical History  Diagnosis Date  . Diabetes mellitus without complication   . Hypertension   . CHF (congestive heart failure)   . RBBB   . Dyslipidemia   . Morbid obesity   . Diastolic dysfunction   . Coronary artery disease     s/p CABG  . Hyperlipidemia     Current Outpatient Prescriptions  Medication Sig Dispense Refill  . albuterol-ipratropium (COMBIVENT) 18-103 MCG/ACT inhaler Inhale 2 puffs into the lungs every 6 (six) hours as needed for wheezing or shortness of breath.       . allopurinol (ZYLOPRIM) 100 MG tablet Take 100 mg by mouth daily.      Marland Kitchen amLODipine-valsartan (EXFORGE) 10-320 MG per tablet Take 1 tablet by mouth daily.      . Ascorbic Acid (VITAMIN C) 1000 MG tablet Take 1,000 mg by mouth daily.      Marland Kitchen aspirin 325 MG EC tablet Take 325 mg by mouth daily.      Marland Kitchen buPROPion (WELLBUTRIN XL) 150 MG 24 hr tablet Take 150 mg by mouth daily.      . cilostazol (PLETAL) 100 MG tablet Take 100 mg by mouth 2 (two) times  daily.      . clotrimazole-betamethasone (LOTRISONE) lotion Apply 1 application topically 2 (two) times daily. To rash at breast and groin area as directed      . esomeprazole (NEXIUM) 40 MG capsule Take 40 mg by mouth daily before breakfast.      . fluticasone (FLOVENT HFA) 220 MCG/ACT inhaler Inhale 1-2 puffs into the lungs 2 (two) times daily.      Marland Kitchen gabapentin (NEURONTIN) 300 MG capsule Take 300 mg by mouth 3 (three) times daily.      Marland Kitchen HYDROcodone-acetaminophen (NORCO/VICODIN) 5-325 MG per tablet Take 2 tablets by mouth every 6 (six) hours as needed for pain.  30 tablet  0  . imipramine (TOFRANIL) 10 MG tablet Take 50 mg by mouth at bedtime.      Marland Kitchen nystatin-triamcinolone (MYCOLOG II) cream Apply 1 application topically 2 (two) times daily. To rash at groin and breast area as directed      . traZODone (DESYREL) 50 MG tablet Take 50-150 mg by mouth at bedtime as needed. As needed for sleep.  May increase to three tablets if needed (150mg )       No current facility-administered medications for this visit.    Allergies:  No Known Allergies  Social History:  The patient  reports that she has quit smoking. She does not have any smokeless tobacco history on file. She reports that she does not drink alcohol or use illicit drugs.   Family History:  The patient's family history includes Heart attack in her mother; Heart disease in her mother; Stomach cancer in her father.   ROS:  Please see the history of present illness.      All other systems reviewed and negative.   PHYSICAL EXAM: VS:  BP 124/60  Pulse 50  Ht 5\' 2"  (1.575 m)  Wt 218 lb (98.884 kg)  BMI 39.86 kg/m2  SpO2 97% Well nourished, well developed, in no acute distress HEENT: normal Neck: no JVD Cardiac:  normal S1, S2; RRR; no murmur Lungs:  clear to auscultation bilaterally, no wheezing, rhonchi or rales Abd: soft, nontender, no hepatomegaly Ext: no edema Skin: warm and dry Neuro:  CNs 2-12 intact, no focal abnormalities  noted  EKG:       ASSESSMENT AND PLAN:  1. ASCAD stable with no angina  - continue ASA 2. Atypical CP right sided most c/w with musculoskeletal etiology 3. HTN  - continue Amodipine/valsartan  - check BMET 4. Dyslipidemia  - she stopped her statin after running out some time ago  - check fasting statin panel 5. Chronic diastolic CHF - euvolemic on exam 6. OSA and never followed through on CPAP  - split night PSG to evaluate for OSA   Followup with me in 6 months  Signed, Armanda Magic, MD 05/06/2013 3:53 PM

## 2013-05-13 ENCOUNTER — Other Ambulatory Visit: Payer: Self-pay | Admitting: Orthopedic Surgery

## 2013-05-13 ENCOUNTER — Ambulatory Visit
Admission: RE | Admit: 2013-05-13 | Discharge: 2013-05-13 | Disposition: A | Discharge status: Home / Self Care | Payer: Medicare Other | Source: Ambulatory Visit | Attending: Orthopedic Surgery | Admitting: Orthopedic Surgery

## 2013-05-13 ENCOUNTER — Encounter: Payer: Self-pay | Admitting: General Surgery

## 2013-05-13 ENCOUNTER — Other Ambulatory Visit (INDEPENDENT_AMBULATORY_CARE_PROVIDER_SITE_OTHER): Payer: Medicare Other

## 2013-05-13 ENCOUNTER — Other Ambulatory Visit: Payer: Self-pay | Admitting: General Surgery

## 2013-05-13 DIAGNOSIS — M25561 Pain in right knee: Secondary | ICD-10-CM

## 2013-05-13 DIAGNOSIS — I5032 Chronic diastolic (congestive) heart failure: Secondary | ICD-10-CM

## 2013-05-13 DIAGNOSIS — R52 Pain, unspecified: Secondary | ICD-10-CM

## 2013-05-13 DIAGNOSIS — E785 Hyperlipidemia, unspecified: Secondary | ICD-10-CM

## 2013-05-13 DIAGNOSIS — I509 Heart failure, unspecified: Secondary | ICD-10-CM

## 2013-05-13 DIAGNOSIS — Z79899 Other long term (current) drug therapy: Secondary | ICD-10-CM

## 2013-05-13 LAB — HEPATIC FUNCTION PANEL
AST: 17 U/L (ref 0–37)
Total Bilirubin: 0.4 mg/dL (ref 0.3–1.2)

## 2013-05-13 LAB — BASIC METABOLIC PANEL
CO2: 24 mEq/L (ref 19–32)
Calcium: 8.5 mg/dL (ref 8.4–10.5)
Chloride: 108 mEq/L (ref 96–112)
Glucose, Bld: 104 mg/dL — ABNORMAL HIGH (ref 70–99)
Sodium: 140 mEq/L (ref 135–145)

## 2013-05-13 LAB — LIPID PANEL
HDL: 69.5 mg/dL (ref >39.00)
LDL Cholesterol: 64 mg/dL (ref 0–99)
Total CHOL/HDL Ratio: 2

## 2013-05-24 ENCOUNTER — Telehealth: Payer: Self-pay | Admitting: Cardiology

## 2013-05-24 NOTE — Telephone Encounter (Signed)
Please let patient know that she has mild OSA with O2 desats to 80% and set up for CPAP titration.

## 2013-05-25 NOTE — Telephone Encounter (Signed)
To Danielle.  

## 2013-05-26 NOTE — Telephone Encounter (Signed)
Pt is aware and sent from over to [facility] to get pt set up for CPAP titration.

## 2013-05-27 ENCOUNTER — Telehealth: Payer: Self-pay | Admitting: Cardiology

## 2013-05-27 NOTE — Telephone Encounter (Signed)
Pt requested to have a copy of her sleep study Sent to her PCP that is in the system. I went ahead and faxed a copy of the telephone encounter Dr Mayford Knife sent saying pt needed to have a CPAP titration. Dr. Mayford Knife when will the actual study be uploaded into the system? Are you printing them out to be scanned in?

## 2013-05-27 NOTE — Telephone Encounter (Signed)
New message  Patient wouldn't give me no information. She wanted to speak with Danielle only. Please call patient.

## 2013-05-27 NOTE — Telephone Encounter (Signed)
Waiting for copy for sleep lab

## 2013-05-28 NOTE — Telephone Encounter (Signed)
Sleep study faxed over to PCP. Sent to be scanned in.

## 2013-06-15 ENCOUNTER — Telehealth: Payer: Self-pay | Admitting: Cardiology

## 2013-06-15 NOTE — Telephone Encounter (Signed)
Please let patient know that she had successful CPAP titration and set her up with [facility] and followup with me in 10 weeks

## 2013-06-15 NOTE — Telephone Encounter (Signed)
To Danielle.  

## 2013-06-16 ENCOUNTER — Telehealth: Payer: Self-pay | Admitting: Cardiology

## 2013-06-16 ENCOUNTER — Encounter: Payer: Self-pay | Admitting: General Surgery

## 2013-06-16 NOTE — Telephone Encounter (Signed)
New message  Patient would like to know the results of her sleep apena test, Please call and advise.

## 2013-06-16 NOTE — Telephone Encounter (Signed)
Pt aware and scheduled for 10 week sleep f./u

## 2013-06-30 ENCOUNTER — Encounter: Payer: Self-pay | Admitting: Cardiology

## 2013-07-13 ENCOUNTER — Encounter: Payer: Self-pay | Admitting: Cardiology

## 2013-08-25 ENCOUNTER — Ambulatory Visit: Payer: Medicare Other | Admitting: Cardiology

## 2013-08-27 ENCOUNTER — Encounter: Payer: Self-pay | Admitting: Cardiology

## 2013-08-31 ENCOUNTER — Ambulatory Visit: Payer: Medicare Other | Admitting: Cardiology

## 2013-09-22 ENCOUNTER — Encounter: Payer: Self-pay | Admitting: Cardiology

## 2013-09-22 ENCOUNTER — Ambulatory Visit (INDEPENDENT_AMBULATORY_CARE_PROVIDER_SITE_OTHER): Payer: Medicare Other | Admitting: Cardiology

## 2013-09-22 VITALS — BP 178/73 | HR 74 | Ht 62.5 in | Wt 241.0 lb

## 2013-09-22 DIAGNOSIS — I509 Heart failure, unspecified: Secondary | ICD-10-CM

## 2013-09-22 DIAGNOSIS — I5032 Chronic diastolic (congestive) heart failure: Secondary | ICD-10-CM

## 2013-09-22 DIAGNOSIS — R0602 Shortness of breath: Secondary | ICD-10-CM | POA: Insufficient documentation

## 2013-09-22 DIAGNOSIS — E785 Hyperlipidemia, unspecified: Secondary | ICD-10-CM

## 2013-09-22 DIAGNOSIS — G4733 Obstructive sleep apnea (adult) (pediatric): Secondary | ICD-10-CM

## 2013-09-22 DIAGNOSIS — I2510 Atherosclerotic heart disease of native coronary artery without angina pectoris: Secondary | ICD-10-CM

## 2013-09-22 DIAGNOSIS — I519 Heart disease, unspecified: Secondary | ICD-10-CM

## 2013-09-22 DIAGNOSIS — I10 Essential (primary) hypertension: Secondary | ICD-10-CM

## 2013-09-22 DIAGNOSIS — I5189 Other ill-defined heart diseases: Secondary | ICD-10-CM

## 2013-09-22 LAB — BASIC METABOLIC PANEL
BUN: 11 mg/dL (ref 6–23)
CO2: 27 mEq/L (ref 19–32)
CREATININE: 1.1 mg/dL (ref 0.4–1.2)
Calcium: 9.1 mg/dL (ref 8.4–10.5)
Chloride: 105 mEq/L (ref 96–112)
GFR: 65.06 mL/min (ref >60.00)
Glucose, Bld: 135 mg/dL — ABNORMAL HIGH (ref 70–99)
Potassium: 4.3 mEq/L (ref 3.5–5.1)
Sodium: 138 mEq/L (ref 135–145)

## 2013-09-22 LAB — BRAIN NATRIURETIC PEPTIDE: PRO B NATRI PEPTIDE: 146 pg/mL — AB (ref 0.0–100.0)

## 2013-09-22 LAB — TSH: TSH: 1.65 u[IU]/mL (ref 0.35–5.50)

## 2013-09-22 MED ORDER — FUROSEMIDE 20 MG PO TABS: 20.0000 mg | ORAL_TABLET | Freq: Every day | ORAL | Status: DC

## 2013-09-22 MED ORDER — METOPROLOL SUCCINATE ER 25 MG PO TB24
25.0000 mg | ORAL_TABLET | Freq: Every day | ORAL | Status: DC
Start: 2013-09-22 — End: 2014-09-23

## 2013-09-22 MED ORDER — ASPIRIN 81 MG PO TBEC: 325.0000 mg | DELAYED_RELEASE_TABLET | Freq: Every day | ORAL | Status: DC

## 2013-09-22 NOTE — Patient Instructions (Addendum)
Your physician has recommended you make the following change in your medication: 1. Decrease Aspirin to 81 MG 1 tablet daily. 2. Start Toprol XL 25 MG 1 Tablet daily 3. Start Lasix 20 MG 1 tablet daily  Your physician has requested that you have an echocardiogram. Echocardiography is a painless test that uses sound waves to create images of your heart. It provides your doctor with information about the size and shape of your heart and how well your heart's chambers and valves are working. This procedure takes approximately one hour. There are no restrictions for this procedure.  Your physician recommends that you go to the lab today for a BNP, BMET, and TSH  Your physician recommends that you schedule a follow-up appointment in: 1 week with Dr Mayford Knifeurner

## 2013-09-22 NOTE — Progress Notes (Signed)
[address] [place], [place]  [address] Phone: [phone] Fax:  [phone]  Date:  09/22/2013   ID:  Lisa [name], DOB [birth year], MRN [id number]  PCP:  Laurena Slimmer, MD  Cardiologist:  Armanda Magic, MD     History of Present Illness:Lisa [name] is a 73 y.o. female with a history of CAD, HTN, dyslipidemia, diastolic dysfunction, chronic diastolic CHF . She is doing well. She denies any palpitations, dizziness or syncope. She has occasional LE edema which is stable.  She complains of increased DOE since I saw her last.  She recently underwent sleep study showing OSA and underwent CPAP titration and is on 16cm H2O.  She presents back today for followup of her CPAP.  She is tolerating her CPAP therapy well.  She feels rested in the am and has no daytime sleepiness.  She feels the pressure is adquate.  Her weight in October was 218lbs and she has now gained 23 lbs.      Wt Readings from Last 3 Encounters:  09/22/13 241 lb (109.317 kg)  05/06/13 218 lb (98.884 kg)     Past Medical History  Diagnosis Date  . Diabetes mellitus without complication   . Hypertension   . CHF (congestive heart failure)   . RBBB   . Dyslipidemia   . Morbid obesity   . Diastolic dysfunction   . Coronary artery disease     s/p CABG  . Hyperlipidemia   . OSA (obstructive sleep apnea)     per pt diagnosed in the past but never put on CPAP    Current Outpatient Prescriptions  Medication Sig Dispense Refill  . albuterol-ipratropium (COMBIVENT) 18-103 MCG/ACT inhaler Inhale 2 puffs into the lungs every 6 (six) hours as needed for wheezing or shortness of breath.       . allopurinol (ZYLOPRIM) 100 MG tablet Take 100 mg by mouth daily.      Marland Kitchen amLODipine-valsartan (EXFORGE) 10-320 MG per tablet Take 1 tablet by mouth daily.      . Ascorbic Acid (VITAMIN C) 1000 MG tablet Take 1,000 mg by mouth daily.      Marland Kitchen aspirin 81 MG EC tablet Take 4 tablets (325 mg total) by mouth daily.      Marland Kitchen  buPROPion (WELLBUTRIN XL) 150 MG 24 hr tablet Take 150 mg by mouth daily.      . cilostazol (PLETAL) 100 MG tablet Take 100 mg by mouth 2 (two) times daily.      . clotrimazole-betamethasone (LOTRISONE) lotion Apply 1 application topically 2 (two) times daily. To rash at breast and groin area as directed      . esomeprazole (NEXIUM) 40 MG capsule Take 40 mg by mouth daily before breakfast.      . fluticasone (FLOVENT HFA) 220 MCG/ACT inhaler Inhale 1-2 puffs into the lungs 2 (two) times daily.      Marland Kitchen gabapentin (NEURONTIN) 300 MG capsule Take 300 mg by mouth 3 (three) times daily.      Marland Kitchen HYDROcodone-acetaminophen (NORCO/VICODIN) 5-325 MG per tablet Take 2 tablets by mouth every 6 (six) hours as needed for pain.  30 tablet  0  . imipramine (TOFRANIL) 10 MG tablet Take 50 mg by mouth at bedtime.      Marland Kitchen nystatin-triamcinolone (MYCOLOG II) cream Apply 1 application topically 2 (two) times daily. To rash at groin and breast area as directed      . traZODone (DESYREL) 50 MG tablet Take 50-150 mg by  mouth at bedtime as needed. As needed for sleep.  May increase to three tablets if needed (150mg )       No current facility-administered medications for this visit.    Allergies:   No Known Allergies  Social History:  The patient  reports that she has quit smoking. She does not have any smokeless tobacco history on file. She reports that she does not drink alcohol or use illicit drugs.   Family History:  The patient's family history includes Heart attack in her mother; Heart disease in her mother; Stomach cancer in her father.   ROS:  Please see the history of present illness.      All other systems reviewed and negative.   PHYSICAL EXAM: VS:  BP 178/73  Pulse 74  Ht 5' 2.5" (1.588 m)  Wt 241 lb (109.317 kg)  BMI 43.35 kg/m2 Well nourished, well developed, in no acute distress HEENT: normal Neck: no JVD Cardiac:  normal S1, S2; RRR; no murmur Lungs:  clear to auscultation bilaterally, no  wheezing, rhonchi or rales Abd: soft, nontender, no hepatomegaly Ext: trace edema Skin: warm and dry Neuro:  CNs 2-12 intact, no focal abnormalities noted       ASSESSMENT AND PLAN:  1. OSA on CPAP and tolerating well.  Her download today showed an AHI of 1/hr on 16cm H2O and 90% compliance in using more than 4 hours nightly.  She will continue on current settings.  2. Morbid obesity 3. HTN - poorly controlled today but she had to walk a distance to get to the office. - Continue Exforge - add Toprol XL 25mg  daily 4. ASCAD - no angina - decrease ASA to 81mg  daily 5. Chronic diastolic CHF - She currently is not on any diuretic so I will add Lasix 20mg  daily - check BNP/BMET/TSH 6.  Chronic DOE which I think is multifactorial from morbid obesity, chronic diastolic CHF, recent weight gain and sedentary lifestyle.  I will check a BNP to assess for volume overload and an echo to reassess LVF.    Followup with me in 1 week  Signed, Armanda Magicraci Turner, MD 09/22/2013 2:29 PM

## 2013-09-26 ENCOUNTER — Encounter: Payer: Self-pay | Admitting: Cardiology

## 2013-09-30 ENCOUNTER — Ambulatory Visit (INDEPENDENT_AMBULATORY_CARE_PROVIDER_SITE_OTHER): Payer: Medicare Other | Admitting: Cardiology

## 2013-09-30 ENCOUNTER — Encounter: Payer: Self-pay | Admitting: Cardiology

## 2013-09-30 VITALS — BP 122/80 | HR 60 | Ht 64.0 in | Wt 251.0 lb

## 2013-09-30 DIAGNOSIS — I5189 Other ill-defined heart diseases: Secondary | ICD-10-CM

## 2013-09-30 DIAGNOSIS — I10 Essential (primary) hypertension: Secondary | ICD-10-CM

## 2013-09-30 DIAGNOSIS — G4733 Obstructive sleep apnea (adult) (pediatric): Secondary | ICD-10-CM

## 2013-09-30 DIAGNOSIS — I509 Heart failure, unspecified: Secondary | ICD-10-CM

## 2013-09-30 DIAGNOSIS — I5032 Chronic diastolic (congestive) heart failure: Secondary | ICD-10-CM

## 2013-09-30 DIAGNOSIS — E785 Hyperlipidemia, unspecified: Secondary | ICD-10-CM

## 2013-09-30 DIAGNOSIS — R0602 Shortness of breath: Secondary | ICD-10-CM

## 2013-09-30 DIAGNOSIS — I2510 Atherosclerotic heart disease of native coronary artery without angina pectoris: Secondary | ICD-10-CM

## 2013-09-30 DIAGNOSIS — I519 Heart disease, unspecified: Secondary | ICD-10-CM

## 2013-09-30 LAB — BASIC METABOLIC PANEL
BUN: 37 mg/dL — ABNORMAL HIGH (ref 6–23)
CALCIUM: 8.8 mg/dL (ref 8.4–10.5)
CO2: 24 mEq/L (ref 19–32)
CREATININE: 1.7 mg/dL — AB (ref 0.4–1.2)
Chloride: 105 mEq/L (ref 96–112)
GFR: 36.72 mL/min — AB (ref >60.00)
Glucose, Bld: 354 mg/dL — ABNORMAL HIGH (ref 70–99)
Potassium: 5.5 mEq/L — ABNORMAL HIGH (ref 3.5–5.1)
Sodium: 137 mEq/L (ref 135–145)

## 2013-09-30 LAB — BRAIN NATRIURETIC PEPTIDE: PRO B NATRI PEPTIDE: 178 pg/mL — AB (ref 0.0–100.0)

## 2013-09-30 NOTE — Progress Notes (Signed)
[address] [place], [place]  [address] Phone: [phone] Fax:  [phone]  Date:  09/30/2013   ID:  Lisa [name] Isip, DOB Nov 25, 1940, MRN [id number]  PCP:  [name],[name], MD  Cardiologist:  Armanda Magicraci Turner, MD     History of Present Illness: Lisa [name] Lisa [name] is [name] 73 y.o. female with [name] history of CAD, HTN, dyslipidemia, diastolic dysfunction, chronic diastolic CHF . She is doing well. She denies any palpitations, dizziness or syncope. She has occasional LE edema which is stable. Her SOB has significantly improved since I saw her last. She denies any further chest pain.  When I last saw her I ordered and PSG which showed mild OSA with O2 desats into the low 80's.  She underwent CPAP titration and now is on CPAP.  She is doing well with her CPAP device.  She tolerates her full face mask and feels the pressure is adequate.  She feels rested in the am and has no daytime sleepiness.  She thinks her daytime sleepiness has improved.  She still gets up some at night to go to the bathroom..    Wt Readings from Last 3 Encounters:  09/22/13 241 lb (109.317 kg)  05/06/13 218 lb (98.884 kg)     Past Medical History  Diagnosis Date  . Diabetes mellitus without complication   . Hypertension   . CHF (congestive heart failure)   . RBBB   . Dyslipidemia   . Morbid obesity   . Diastolic dysfunction   . Coronary artery disease     s/p CABG  . Hyperlipidemia   . OSA (obstructive sleep apnea)     mild OSA with AHI 7.15 now on CPAP at 16cm H2O    Current Outpatient Prescriptions  Medication Sig Dispense Refill  . albuterol-ipratropium (COMBIVENT) 18-103 MCG/ACT inhaler Inhale 2 puffs into the lungs every 6 (six) hours as needed for wheezing or shortness of breath.       . allopurinol (ZYLOPRIM) 100 MG tablet Take 100 mg by mouth daily.      Marland Kitchen. amLODipine-valsartan (EXFORGE) 10-320 MG per tablet Take 1 tablet by mouth daily.      . Ascorbic Acid (VITAMIN C) 1000 MG tablet Take 1,000  mg by mouth daily.      Marland Kitchen. aspirin 81 MG EC tablet Take 4 tablets (325 mg total) by mouth daily.      Marland Kitchen. buPROPion (WELLBUTRIN XL) 150 MG 24 hr tablet Take 150 mg by mouth daily.      . cilostazol (PLETAL) 100 MG tablet Take 100 mg by mouth 2 (two) times daily.      . clotrimazole-betamethasone (LOTRISONE) lotion Apply 1 application topically 2 (two) times daily. To rash at breast and groin area as directed      . esomeprazole (NEXIUM) 40 MG capsule Take 40 mg by mouth daily before breakfast.      . fluticasone (FLOVENT HFA) 220 MCG/ACT inhaler Inhale 1-2 puffs into the lungs 2 (two) times daily.      . furosemide (LASIX) 20 MG tablet Take 1 tablet (20 mg total) by mouth daily.  30 tablet  5  . gabapentin (NEURONTIN) 300 MG capsule Take 300 mg by mouth 3 (three) times daily.      Marland Kitchen. HYDROcodone-acetaminophen (NORCO/VICODIN) 5-325 MG per tablet Take 2 tablets by mouth every 6 (six) hours as needed for pain.  30 tablet  0  . imipramine (TOFRANIL) 10 MG tablet Take 50 mg by mouth at  bedtime.      . metoprolol succinate (TOPROL XL) 25 MG 24 hr tablet Take 1 tablet (25 mg total) by mouth daily.  30 tablet  5  . nystatin-triamcinolone (MYCOLOG II) cream Apply 1 application topically 2 (two) times daily. To rash at groin and breast area as directed      . traZODone (DESYREL) 50 MG tablet Take 50-150 mg by mouth at bedtime as needed. As needed for sleep.  May increase to three tablets if needed (150mg )       No current facility-administered medications for this visit.    Allergies:   No Known Allergies  Social History:  The patient  reports that she has quit smoking. She does not have any smokeless tobacco history on file. She reports that she does not drink alcohol or use illicit drugs.   Family History:  The patient's family history includes Heart attack in her mother; Heart disease in her mother; Stomach cancer in her father.   ROS:  Please see the history of present illness.      All other systems  reviewed and negative.   PHYSICAL EXAM: VS:  There were no vitals taken for this visit. Well nourished, well developed, in no acute distress HEENT: normal Neck: no JVD Cardiac:  normal S1, S2; RRR; no murmuroccasional ectopy Lungs:  clear to auscultation bilaterally, no wheezing, rhonchi or rales Abd: soft, nontender, no hepatomegaly Ext: no edema Skin: warm and dry Neuro:  CNs 2-12 intact, no focal abnormalities noted  EKG:  NSR with PVC's, RBBB, LAFB, LVH with QRS widening   ASSESSMENT AND PLAN:  1. ASCAD stable with no angina - continue ASA  2. Atypical CP right sided - resolved 3. HTN - continue Amodipine/valsartan/ Metoprolol 4. Dyslipidemia 5.  Chronic diastolic CHF - compensated - continue beta blocker/ARB and Lasix 6.  OSA now on CPAP and tolerating well - I will get [name] CPAP download from DME  7.  PVC's - asymptomatic  Followup with me in 6 months      Signed, Armanda Magic, MD 09/30/2013 1:19 PM

## 2013-09-30 NOTE — Patient Instructions (Signed)
Your physician recommends that you continue on your current medications as directed. Please refer to the Current Medication list given to you today.  Your physician recommends that you go to the labs today for BMET and BNP

## 2013-10-01 ENCOUNTER — Encounter: Payer: Self-pay | Admitting: Cardiology

## 2013-10-04 ENCOUNTER — Other Ambulatory Visit: Payer: Self-pay | Admitting: General Surgery

## 2013-10-04 DIAGNOSIS — Z79899 Other long term (current) drug therapy: Secondary | ICD-10-CM

## 2013-10-04 MED ORDER — FUROSEMIDE 20 MG PO TABS: 20.0000 mg | ORAL_TABLET | ORAL | Status: DC

## 2013-10-06 ENCOUNTER — Encounter: Payer: Self-pay | Admitting: Cardiology

## 2013-10-07 ENCOUNTER — Other Ambulatory Visit: Payer: Self-pay

## 2013-10-07 ENCOUNTER — Encounter: Payer: Self-pay | Admitting: Cardiovascular Disease

## 2013-10-07 ENCOUNTER — Ambulatory Visit (HOSPITAL_COMMUNITY): Payer: Medicare Other | Attending: Cardiovascular Disease | Admitting: Radiology

## 2013-10-07 DIAGNOSIS — I509 Heart failure, unspecified: Secondary | ICD-10-CM

## 2013-10-07 DIAGNOSIS — I5032 Chronic diastolic (congestive) heart failure: Secondary | ICD-10-CM

## 2013-10-07 DIAGNOSIS — R0602 Shortness of breath: Secondary | ICD-10-CM | POA: Insufficient documentation

## 2013-10-07 DIAGNOSIS — I2510 Atherosclerotic heart disease of native coronary artery without angina pectoris: Secondary | ICD-10-CM

## 2013-10-07 MED ORDER — PERFLUTREN PROTEIN A MICROSPH IV SUSP
3.0000 mL | Freq: Once | INTRAVENOUS | Status: AC
  Administered 2013-10-07: 3 mL via INTRAVENOUS

## 2013-10-07 NOTE — Progress Notes (Signed)
Echocardiogram performed with optison.  

## 2013-10-11 ENCOUNTER — Other Ambulatory Visit (INDEPENDENT_AMBULATORY_CARE_PROVIDER_SITE_OTHER): Payer: Medicare Other

## 2013-10-11 DIAGNOSIS — Z79899 Other long term (current) drug therapy: Secondary | ICD-10-CM

## 2013-10-11 LAB — BASIC METABOLIC PANEL
BUN: 21 mg/dL (ref 6–23)
CO2: 25 mEq/L (ref 19–32)
Calcium: 9.3 mg/dL (ref 8.4–10.5)
Chloride: 107 mEq/L (ref 96–112)
Creatinine, Ser: 1.1 mg/dL (ref 0.4–1.2)
GFR: 61.68 mL/min (ref >60.00)
Glucose, Bld: 173 mg/dL — ABNORMAL HIGH (ref 70–99)
POTASSIUM: 5.1 meq/L (ref 3.5–5.1)
SODIUM: 141 meq/L (ref 135–145)

## 2013-10-12 ENCOUNTER — Encounter: Payer: Self-pay | Admitting: Cardiology

## 2013-10-15 ENCOUNTER — Encounter: Payer: Self-pay | Admitting: Cardiology

## 2013-10-19 ENCOUNTER — Encounter: Payer: Self-pay | Admitting: Cardiology

## 2013-10-20 ENCOUNTER — Encounter: Payer: Self-pay | Admitting: Cardiology

## 2013-10-22 ENCOUNTER — Encounter: Payer: Self-pay | Admitting: Cardiology

## 2013-11-10 ENCOUNTER — Other Ambulatory Visit (INDEPENDENT_AMBULATORY_CARE_PROVIDER_SITE_OTHER): Payer: Medicare Other

## 2013-11-10 DIAGNOSIS — Z79899 Other long term (current) drug therapy: Secondary | ICD-10-CM

## 2013-11-10 DIAGNOSIS — E785 Hyperlipidemia, unspecified: Secondary | ICD-10-CM

## 2013-11-10 LAB — HEPATIC FUNCTION PANEL
ALT: 23 U/L (ref 0–35)
AST: 24 U/L (ref 0–37)
Albumin: 3 g/dL — ABNORMAL LOW (ref 3.5–5.2)
Alkaline Phosphatase: 58 U/L (ref 39–117)
BILIRUBIN DIRECT: 0.1 mg/dL (ref 0.0–0.3)
TOTAL PROTEIN: 6.2 g/dL (ref 6.0–8.3)
Total Bilirubin: 0.5 mg/dL (ref 0.2–1.2)

## 2013-11-10 LAB — LIPID PANEL
CHOLESTEROL: 166 mg/dL (ref 0–200)
HDL: 61.7 mg/dL (ref >39.00)
LDL Cholesterol: 77 mg/dL (ref 0–99)
TRIGLYCERIDES: 135 mg/dL (ref 0.0–149.0)
Total CHOL/HDL Ratio: 3
VLDL: 27 mg/dL (ref 0.0–40.0)

## 2013-11-12 ENCOUNTER — Encounter: Payer: Self-pay | Admitting: General Surgery

## 2013-11-12 ENCOUNTER — Other Ambulatory Visit: Payer: Self-pay | Admitting: General Surgery

## 2013-11-12 DIAGNOSIS — E785 Hyperlipidemia, unspecified: Secondary | ICD-10-CM

## 2013-11-12 MED ORDER — SIMVASTATIN 10 MG PO TABS: 10.0000 mg | ORAL_TABLET | Freq: Every day | ORAL | Status: DC

## 2014-02-10 ENCOUNTER — Encounter: Payer: Self-pay | Admitting: General Surgery

## 2014-02-10 ENCOUNTER — Telehealth: Payer: Self-pay | Admitting: Cardiology

## 2014-02-10 NOTE — Telephone Encounter (Addendum)
New message     [name]

## 2014-02-10 NOTE — Telephone Encounter (Signed)
[facility]

## 2014-02-10 NOTE — Telephone Encounter (Signed)
Pt requested printed copy of sleep study. Printed and put up front for pt to pick up.

## 2014-03-08 ENCOUNTER — Other Ambulatory Visit: Payer: Self-pay | Admitting: Cardiology

## 2014-03-15 ENCOUNTER — Other Ambulatory Visit: Payer: Medicare Other

## 2014-03-22 ENCOUNTER — Encounter: Payer: Self-pay | Admitting: Cardiology

## 2014-03-22 ENCOUNTER — Ambulatory Visit (INDEPENDENT_AMBULATORY_CARE_PROVIDER_SITE_OTHER): Payer: Medicare Other | Admitting: Cardiology

## 2014-03-22 VITALS — BP 126/74 | HR 88 | Ht 64.0 in | Wt 243.0 lb

## 2014-03-22 DIAGNOSIS — I2510 Atherosclerotic heart disease of native coronary artery without angina pectoris: Secondary | ICD-10-CM

## 2014-03-22 DIAGNOSIS — G4733 Obstructive sleep apnea (adult) (pediatric): Secondary | ICD-10-CM

## 2014-03-22 DIAGNOSIS — E785 Hyperlipidemia, unspecified: Secondary | ICD-10-CM

## 2014-03-22 DIAGNOSIS — I10 Essential (primary) hypertension: Secondary | ICD-10-CM

## 2014-03-22 DIAGNOSIS — I493 Ventricular premature depolarization: Secondary | ICD-10-CM

## 2014-03-22 DIAGNOSIS — I4949 Other premature depolarization: Secondary | ICD-10-CM

## 2014-03-22 LAB — BASIC METABOLIC PANEL
BUN: 13 mg/dL (ref 6–23)
CHLORIDE: 109 meq/L (ref 96–112)
CO2: 22 meq/L (ref 19–32)
Calcium: 8.8 mg/dL (ref 8.4–10.5)
Creatinine, Ser: 1 mg/dL (ref 0.4–1.2)
GFR: 70.3 mL/min (ref >60.00)
GLUCOSE: 112 mg/dL — AB (ref 70–99)
POTASSIUM: 5.2 meq/L — AB (ref 3.5–5.1)
SODIUM: 137 meq/L (ref 135–145)

## 2014-03-22 MED ORDER — AMLODIPINE BESYLATE-VALSARTAN 10-320 MG PO TABS: 1.0000 | ORAL_TABLET | Freq: Every day | ORAL | Status: DC

## 2014-03-22 MED ORDER — ASPIRIN EC 325 MG PO TBEC: 325.0000 mg | DELAYED_RELEASE_TABLET | Freq: Every day | ORAL | Status: DC

## 2014-03-22 NOTE — Patient Instructions (Signed)
Continue current CPAP/BiPAP Settings.  Your physician recommends that you go to the lab today for a BMET  Your physician wants you to follow-up in: 6 months with Dr Sherlyn Lick will receive a reminder letter in the mail two months in advance. If you don't receive a letter, please call our office to schedule the follow-up appointment.

## 2014-03-22 NOTE — Progress Notes (Signed)
[address] [place], [place]  [address] Phone: [phone] Fax:  [phone]  Date:  03/22/2014   ID:  Lisa [name], DOB 12-07-1940, MRN [id number]  PCP:  Laurena Slimmer, MD  Cardiologist:  Armanda Magic, MD     History of Present Illness: Lisa [name] is a 73 y.o. female with a history of CAD, HTN, dyslipidemia, diastolic dysfunction, chronic diastolic CHF . She is doing well. She denies any chest pain, palpitations, dizziness or syncope. She has occasional LE edema which is stable. She has chronic DOE which is stable.  She is doing well with her CPAP device.  She tolerates her full face mask and feels the pressure is adequate. She feels rested in the am and has no daytime sleepiness. She thinks her daytime sleepiness has improved. She still gets up some at night to go to the bathroom..    Wt Readings from Last 3 Encounters:  03/22/14 243 lb (110.224 kg)  09/30/13 251 lb (113.853 kg)  09/22/13 241 lb (109.317 kg)     Past Medical History  Diagnosis Date  . Diabetes mellitus without complication   . Hypertension   . CHF (congestive heart failure)   . RBBB   . Dyslipidemia   . Morbid obesity   . Diastolic dysfunction   . Coronary artery disease     s/p CABG  . Hyperlipidemia   . OSA (obstructive sleep apnea)     mild OSA with AHI 7.15 now on CPAP at 16cm H2O    Current Outpatient Prescriptions  Medication Sig Dispense Refill  . albuterol-ipratropium (COMBIVENT) 18-103 MCG/ACT inhaler Inhale 2 puffs into the lungs every 6 (six) hours as needed for wheezing or shortness of breath.       . allopurinol (ZYLOPRIM) 100 MG tablet Take 100 mg by mouth daily.      Marland Kitchen amLODipine-valsartan (EXFORGE) 10-320 MG per tablet TAKE 1 TABLET BY MOUTH EVERY DAY  30 tablet  0  . aspirin 81 MG EC tablet Take 4 tablets (325 mg total) by mouth daily.      Marland Kitchen buPROPion (WELLBUTRIN XL) 150 MG 24 hr tablet Take 150 mg by mouth daily.      . celecoxib (CELEBREX) 200 MG capsule  Take 200 mg by mouth 2 (two) times daily.      . cilostazol (PLETAL) 100 MG tablet Take 100 mg by mouth 2 (two) times daily.      . clotrimazole-betamethasone (LOTRISONE) lotion Apply 1 application topically 2 (two) times daily. To rash at breast and groin area as directed      . esomeprazole (NEXIUM) 40 MG capsule Take 40 mg by mouth daily before breakfast.      . fluticasone (FLOVENT HFA) 220 MCG/ACT inhaler Inhale 1-2 puffs into the lungs 2 (two) times daily.      . furosemide (LASIX) 20 MG tablet Take 1 tablet (20 mg total) by mouth every other day.      . gabapentin (NEURONTIN) 300 MG capsule Take 300 mg by mouth 3 (three) times daily.      Marland Kitchen HYDROcodone-acetaminophen (NORCO/VICODIN) 5-325 MG per tablet Take 2 tablets by mouth every 6 (six) hours as needed for pain.  30 tablet  0  . metoprolol succinate (TOPROL XL) 25 MG 24 hr tablet Take 1 tablet (25 mg total) by mouth daily.  30 tablet  5  . nystatin-triamcinolone (MYCOLOG II) cream Apply 1 application topically 2 (two) times daily. To rash at groin and  breast area as directed      . predniSONE (DELTASONE) 5 MG tablet Take 5 mg by mouth 2 (two) times daily.      . simvastatin (ZOCOR) 10 MG tablet Take 1 tablet (10 mg total) by mouth at bedtime.  30 tablet  11  . traZODone (DESYREL) 50 MG tablet Take 50-150 mg by mouth at bedtime as needed. As needed for sleep.  May increase to three tablets if needed ( )      . Ascorbic Acid (VITAMIN C) 1000 MG tablet Take 1,000 mg by mouth daily.      Marland Kitchen imipramine (TOFRANIL) 10 MG tablet Take 50 mg by mouth at bedtime.       No current facility-administered medications for this visit.    Allergies:   No Known Allergies  Social History:  The patient  reports that she has quit smoking. She does not have any smokeless tobacco history on file. She reports that she does not drink alcohol or use illicit drugs.   Family History:  The patient's family history includes Heart attack in her mother; Heart  disease in her mother; Stomach cancer in her father.   ROS:  Please see the history of present illness.      All other systems reviewed and negative.   PHYSICAL EXAM: VS:  BP 126/74  Pulse 88  Ht  (1.626 m)  Wt 243 lb (110.224 kg)  BMI 41.69 kg/m2 Well nourished, well developed, in no acute distress HEENT: normal Neck: no JVD Cardiac:  normal S1, S2; RRR; no murmuroccasional ectopy Lungs:  clear to auscultation bilaterally, no wheezing, rhonchi or rales Abd: soft, nontender, no hepatomegaly Ext: trace  edema Skin: warm and dry Neuro:  CNs 2-12 intact, no focal abnormalities noted  EKG:  NSR with PAC's and PVC's and RBBB     ASSESSMENT AND PLAN:  1. ASCAD stable with no angina - continue ASA  2. Atypical CP right sided - resolved 3. HTN - well controlled - continue Amodipine/valsartan/ Metoprolol  - check BMET 4. Dyslipidemia       5. Chronic diastolic CHF - compensated  - continue beta blocker/ARB and Lasix        6. OSA now on CPAP and tolerating well  - I will get a CPAP download from DME        7. PVC's - asymptomatic   Followup with me in 6 months   Signed, Armanda Magic, MD 03/22/2014 2:34 PM

## 2014-03-24 ENCOUNTER — Encounter: Payer: Self-pay | Admitting: General Surgery

## 2014-03-31 ENCOUNTER — Other Ambulatory Visit: Payer: Self-pay | Admitting: Orthopedic Surgery

## 2014-03-31 ENCOUNTER — Ambulatory Visit
Admission: RE | Admit: 2014-03-31 | Discharge: 2014-03-31 | Disposition: A | Discharge status: Home / Self Care | Payer: Medicare Other | Source: Ambulatory Visit | Attending: Orthopedic Surgery | Admitting: Orthopedic Surgery

## 2014-03-31 DIAGNOSIS — S8002XA Contusion of left knee, initial encounter: Secondary | ICD-10-CM

## 2014-03-31 DIAGNOSIS — S40011A Contusion of right shoulder, initial encounter: Secondary | ICD-10-CM

## 2014-07-12 ENCOUNTER — Encounter: Payer: Self-pay | Admitting: Cardiology

## 2014-07-25 ENCOUNTER — Other Ambulatory Visit: Payer: Self-pay | Admitting: Cardiology

## 2014-08-18 ENCOUNTER — Emergency Department (HOSPITAL_COMMUNITY): Payer: Medicare Other

## 2014-08-18 ENCOUNTER — Encounter (HOSPITAL_COMMUNITY): Payer: Self-pay | Admitting: Emergency Medicine

## 2014-08-18 ENCOUNTER — Inpatient Hospital Stay (HOSPITAL_COMMUNITY)
Admission: EM | Admit: 2014-08-18 | Discharge: 2014-08-23 | DRG: 493 | Disposition: A | Discharge status: Home Health | Payer: Medicare Other | Attending: Internal Medicine | Admitting: Internal Medicine

## 2014-08-18 DIAGNOSIS — D649 Anemia, unspecified: Secondary | ICD-10-CM | POA: Diagnosis not present

## 2014-08-18 DIAGNOSIS — N390 Urinary tract infection, site not specified: Secondary | ICD-10-CM | POA: Diagnosis present

## 2014-08-18 DIAGNOSIS — K219 Gastro-esophageal reflux disease without esophagitis: Secondary | ICD-10-CM | POA: Diagnosis present

## 2014-08-18 DIAGNOSIS — Z7982 Long term (current) use of aspirin: Secondary | ICD-10-CM

## 2014-08-18 DIAGNOSIS — W19XXXA Unspecified fall, initial encounter: Secondary | ICD-10-CM | POA: Diagnosis present

## 2014-08-18 DIAGNOSIS — R7989 Other specified abnormal findings of blood chemistry: Secondary | ICD-10-CM

## 2014-08-18 DIAGNOSIS — Z419 Encounter for procedure for purposes other than remedying health state, unspecified: Secondary | ICD-10-CM

## 2014-08-18 DIAGNOSIS — Z6841 Body Mass Index (BMI) 40.0 and over, adult: Secondary | ICD-10-CM

## 2014-08-18 DIAGNOSIS — N183 Chronic kidney disease, stage 3 (moderate): Secondary | ICD-10-CM | POA: Diagnosis present

## 2014-08-18 DIAGNOSIS — Z87891 Personal history of nicotine dependence: Secondary | ICD-10-CM

## 2014-08-18 DIAGNOSIS — S82891A Other fracture of right lower leg, initial encounter for closed fracture: Secondary | ICD-10-CM | POA: Diagnosis present

## 2014-08-18 DIAGNOSIS — I5032 Chronic diastolic (congestive) heart failure: Secondary | ICD-10-CM

## 2014-08-18 DIAGNOSIS — Z7952 Long term (current) use of systemic steroids: Secondary | ICD-10-CM

## 2014-08-18 DIAGNOSIS — N281 Cyst of kidney, acquired: Secondary | ICD-10-CM | POA: Diagnosis present

## 2014-08-18 DIAGNOSIS — E875 Hyperkalemia: Secondary | ICD-10-CM | POA: Diagnosis present

## 2014-08-18 DIAGNOSIS — N179 Acute kidney failure, unspecified: Secondary | ICD-10-CM | POA: Diagnosis not present

## 2014-08-18 DIAGNOSIS — I129 Hypertensive chronic kidney disease with stage 1 through stage 4 chronic kidney disease, or unspecified chronic kidney disease: Secondary | ICD-10-CM | POA: Diagnosis present

## 2014-08-18 DIAGNOSIS — I4510 Unspecified right bundle-branch block: Secondary | ICD-10-CM | POA: Diagnosis present

## 2014-08-18 DIAGNOSIS — Z8249 Family history of ischemic heart disease and other diseases of the circulatory system: Secondary | ICD-10-CM

## 2014-08-18 DIAGNOSIS — Z951 Presence of aortocoronary bypass graft: Secondary | ICD-10-CM

## 2014-08-18 DIAGNOSIS — S82851A Displaced trimalleolar fracture of right lower leg, initial encounter for closed fracture: Principal | ICD-10-CM | POA: Diagnosis present

## 2014-08-18 DIAGNOSIS — E785 Hyperlipidemia, unspecified: Secondary | ICD-10-CM | POA: Diagnosis present

## 2014-08-18 DIAGNOSIS — E119 Type 2 diabetes mellitus without complications: Secondary | ICD-10-CM

## 2014-08-18 DIAGNOSIS — Z713 Dietary counseling and surveillance: Secondary | ICD-10-CM | POA: Diagnosis present

## 2014-08-18 DIAGNOSIS — M25569 Pain in unspecified knee: Secondary | ICD-10-CM

## 2014-08-18 DIAGNOSIS — M25571 Pain in right ankle and joints of right foot: Secondary | ICD-10-CM

## 2014-08-18 DIAGNOSIS — I10 Essential (primary) hypertension: Secondary | ICD-10-CM

## 2014-08-18 DIAGNOSIS — S82899A Other fracture of unspecified lower leg, initial encounter for closed fracture: Secondary | ICD-10-CM | POA: Diagnosis present

## 2014-08-18 DIAGNOSIS — Z955 Presence of coronary angioplasty implant and graft: Secondary | ICD-10-CM

## 2014-08-18 DIAGNOSIS — I2510 Atherosclerotic heart disease of native coronary artery without angina pectoris: Secondary | ICD-10-CM | POA: Diagnosis present

## 2014-08-18 DIAGNOSIS — G4733 Obstructive sleep apnea (adult) (pediatric): Secondary | ICD-10-CM | POA: Diagnosis present

## 2014-08-18 LAB — CBC
HCT: 31.8 % — ABNORMAL LOW (ref 36.0–46.0)
HEMOGLOBIN: 10.5 g/dL — AB (ref 12.0–15.0)
MCH: 31.2 pg (ref 26.0–34.0)
MCHC: 33 g/dL (ref 30.0–36.0)
MCV: 94.4 fL (ref 78.0–100.0)
Platelets: 160 10*3/uL (ref 150–400)
RBC: 3.37 MIL/uL — ABNORMAL LOW (ref 3.87–5.11)
RDW: 13.4 % (ref 11.5–15.5)
WBC: 7.8 10*3/uL (ref 4.0–10.5)

## 2014-08-18 LAB — COMPREHENSIVE METABOLIC PANEL
ALBUMIN: 2.9 g/dL — AB (ref 3.5–5.2)
ALK PHOS: 57 U/L (ref 39–117)
ALT: 15 U/L (ref 0–35)
AST: 23 U/L (ref 0–37)
Anion gap: 6 (ref 5–15)
BUN: 11 mg/dL (ref 6–23)
CO2: 26 mmol/L (ref 19–32)
CREATININE: 0.86 mg/dL (ref 0.50–1.10)
Calcium: 8.9 mg/dL (ref 8.4–10.5)
Chloride: 106 mmol/L (ref 96–112)
GFR calc non Af Amer: 64 mL/min — ABNORMAL LOW (ref >90)
GFR, EST AFRICAN AMERICAN: 75 mL/min — AB (ref >90)
Glucose, Bld: 88 mg/dL (ref 70–99)
POTASSIUM: 4.4 mmol/L (ref 3.5–5.1)
Sodium: 138 mmol/L (ref 135–145)
Total Bilirubin: 0.8 mg/dL (ref 0.3–1.2)
Total Protein: 6.1 g/dL (ref 6.0–8.3)

## 2014-08-18 LAB — GLUCOSE, CAPILLARY
Glucose-Capillary: 66 mg/dL — ABNORMAL LOW (ref 70–99)
Glucose-Capillary: 125 mg/dL — ABNORMAL HIGH (ref 70–99)

## 2014-08-18 MED ORDER — CLOTRIMAZOLE 1 % EX CREA
TOPICAL_CREAM | Freq: Two times a day (BID) | CUTANEOUS | Status: DC
  Administered 2014-08-20 – 2014-08-23 (×6): via TOPICAL
  Filled 2014-08-18 (×2): qty 15

## 2014-08-18 MED ORDER — SODIUM CHLORIDE 0.9 % IV SOLN: 250.0000 mL | INTRAVENOUS | Status: DC | PRN

## 2014-08-18 MED ORDER — NYSTATIN-TRIAMCINOLONE 100000-0.1 UNIT/GM-% EX CREA
1.0000 "application " | TOPICAL_CREAM | Freq: Two times a day (BID) | CUTANEOUS | Status: DC
  Administered 2014-08-20 – 2014-08-23 (×6): 1 via TOPICAL
  Filled 2014-08-18 (×2): qty 15

## 2014-08-18 MED ORDER — ACETAMINOPHEN 325 MG PO TABS: 650.0000 mg | ORAL_TABLET | Freq: Four times a day (QID) | ORAL | Status: DC | PRN

## 2014-08-18 MED ORDER — MORPHINE SULFATE 2 MG/ML IJ SOLN
2.0000 mg | INTRAMUSCULAR | Status: DC | PRN
  Administered 2014-08-18 – 2014-08-19 (×2): 2 mg via INTRAVENOUS
  Filled 2014-08-18 (×2): qty 1

## 2014-08-18 MED ORDER — GABAPENTIN 300 MG PO CAPS
300.0000 mg | ORAL_CAPSULE | Freq: Three times a day (TID) | ORAL | Status: DC
  Administered 2014-08-18 – 2014-08-22 (×10): 300 mg via ORAL
  Filled 2014-08-18 (×17): qty 1

## 2014-08-18 MED ORDER — HYDROCODONE-ACETAMINOPHEN 5-325 MG PO TABS
1.0000 | ORAL_TABLET | ORAL | Status: DC | PRN
  Administered 2014-08-18 – 2014-08-23 (×8): 2 via ORAL
  Filled 2014-08-18 (×8): qty 2

## 2014-08-18 MED ORDER — ASPIRIN EC 325 MG PO TBEC
325.0000 mg | DELAYED_RELEASE_TABLET | Freq: Every day | ORAL | Status: DC
  Administered 2014-08-18 – 2014-08-19 (×2): 325 mg via ORAL
  Filled 2014-08-18 (×3): qty 1

## 2014-08-18 MED ORDER — IPRATROPIUM-ALBUTEROL 18-103 MCG/ACT IN AERO: 2.0000 | INHALATION_SPRAY | Freq: Four times a day (QID) | RESPIRATORY_TRACT | Status: DC | PRN

## 2014-08-18 MED ORDER — AMLODIPINE BESYLATE 10 MG PO TABS
10.0000 mg | ORAL_TABLET | Freq: Every day | ORAL | Status: DC
  Administered 2014-08-18 – 2014-08-20 (×3): 10 mg via ORAL
  Filled 2014-08-18 (×4): qty 1

## 2014-08-18 MED ORDER — AMLODIPINE BESYLATE-VALSARTAN 10-320 MG PO TABS: 1.0000 | ORAL_TABLET | Freq: Every day | ORAL | Status: DC

## 2014-08-18 MED ORDER — CLOTRIMAZOLE-BETAMETHASONE 1-0.05 % EX LOTN: 1.0000 "application " | TOPICAL_LOTION | Freq: Two times a day (BID) | CUTANEOUS | Status: DC

## 2014-08-18 MED ORDER — CELECOXIB 200 MG PO CAPS
200.0000 mg | ORAL_CAPSULE | Freq: Two times a day (BID) | ORAL | Status: DC
  Administered 2014-08-18 – 2014-08-19 (×2): 200 mg via ORAL
  Filled 2014-08-18 (×3): qty 1

## 2014-08-18 MED ORDER — SODIUM CHLORIDE 0.9 % IJ SOLN
3.0000 mL | Freq: Two times a day (BID) | INTRAMUSCULAR | Status: DC
  Administered 2014-08-18 – 2014-08-22 (×5): 3 mL via INTRAVENOUS

## 2014-08-18 MED ORDER — ALLOPURINOL 100 MG PO TABS
100.0000 mg | ORAL_TABLET | Freq: Every day | ORAL | Status: DC
  Administered 2014-08-18 – 2014-08-23 (×6): 100 mg via ORAL
  Filled 2014-08-18 (×8): qty 1

## 2014-08-18 MED ORDER — IRBESARTAN 300 MG PO TABS
300.0000 mg | ORAL_TABLET | Freq: Every day | ORAL | Status: DC
  Administered 2014-08-18 – 2014-08-19 (×2): 300 mg via ORAL
  Filled 2014-08-18 (×2): qty 1

## 2014-08-18 MED ORDER — HYDROCODONE-ACETAMINOPHEN 5-325 MG PO TABS: 1.0000 | ORAL_TABLET | Freq: Four times a day (QID) | ORAL | Status: DC | PRN

## 2014-08-18 MED ORDER — IPRATROPIUM-ALBUTEROL 0.5-2.5 (3) MG/3ML IN SOLN: 3.0000 mL | Freq: Four times a day (QID) | RESPIRATORY_TRACT | Status: DC | PRN

## 2014-08-18 MED ORDER — SIMVASTATIN 10 MG PO TABS
10.0000 mg | ORAL_TABLET | Freq: Every day | ORAL | Status: DC
  Administered 2014-08-18 – 2014-08-22 (×5): 10 mg via ORAL
  Filled 2014-08-18 (×6): qty 1

## 2014-08-18 MED ORDER — VITAMIN D (ERGOCALCIFEROL) 1.25 MG (50000 UNIT) PO CAPS
50000.0000 [IU] | ORAL_CAPSULE | ORAL | Status: DC
  Administered 2014-08-20 – 2014-08-23 (×2): 50000 [IU] via ORAL
  Filled 2014-08-18 (×2): qty 1

## 2014-08-18 MED ORDER — ACETAMINOPHEN 650 MG RE SUPP: 650.0000 mg | Freq: Four times a day (QID) | RECTAL | Status: DC | PRN

## 2014-08-18 MED ORDER — BUPROPION HCL ER (XL) 150 MG PO TB24
150.0000 mg | ORAL_TABLET | Freq: Every day | ORAL | Status: DC
  Administered 2014-08-18 – 2014-08-23 (×6): 150 mg via ORAL
  Filled 2014-08-18 (×8): qty 1

## 2014-08-18 MED ORDER — FUROSEMIDE 20 MG PO TABS
20.0000 mg | ORAL_TABLET | ORAL | Status: DC
  Administered 2014-08-19: 20 mg via ORAL
  Filled 2014-08-18 (×2): qty 1

## 2014-08-18 MED ORDER — PANTOPRAZOLE SODIUM 40 MG PO TBEC
40.0000 mg | DELAYED_RELEASE_TABLET | Freq: Every day | ORAL | Status: DC
  Administered 2014-08-18 – 2014-08-23 (×6): 40 mg via ORAL
  Filled 2014-08-18 (×6): qty 1

## 2014-08-18 MED ORDER — FLUTICASONE PROPIONATE HFA 220 MCG/ACT IN AERO
1.0000 | INHALATION_SPRAY | Freq: Two times a day (BID) | RESPIRATORY_TRACT | Status: DC
  Administered 2014-08-19 – 2014-08-23 (×8): 2 via RESPIRATORY_TRACT
  Filled 2014-08-18: qty 12

## 2014-08-18 MED ORDER — CILOSTAZOL 100 MG PO TABS
100.0000 mg | ORAL_TABLET | Freq: Two times a day (BID) | ORAL | Status: DC
  Administered 2014-08-18 – 2014-08-23 (×10): 100 mg via ORAL
  Filled 2014-08-18 (×11): qty 1

## 2014-08-18 MED ORDER — PREDNISONE 5 MG PO TABS
5.0000 mg | ORAL_TABLET | Freq: Two times a day (BID) | ORAL | Status: DC
  Administered 2014-08-18 – 2014-08-23 (×10): 5 mg via ORAL
  Filled 2014-08-18 (×15): qty 1

## 2014-08-18 MED ORDER — TRAZODONE HCL 50 MG PO TABS: 50.0000 mg | ORAL_TABLET | Freq: Every evening | ORAL | Status: DC | PRN

## 2014-08-18 MED ORDER — METOPROLOL SUCCINATE ER 25 MG PO TB24
25.0000 mg | ORAL_TABLET | Freq: Every day | ORAL | Status: DC
  Administered 2014-08-18 – 2014-08-23 (×6): 25 mg via ORAL
  Filled 2014-08-18 (×7): qty 1

## 2014-08-18 MED ORDER — ENOXAPARIN SODIUM 60 MG/0.6ML ~~LOC~~ SOLN
60.0000 mg | SUBCUTANEOUS | Status: DC
  Administered 2014-08-18: 60 mg via SUBCUTANEOUS
  Filled 2014-08-18 (×2): qty 0.6

## 2014-08-18 MED ORDER — INSULIN ASPART 100 UNIT/ML ~~LOC~~ SOLN
0.0000 [IU] | Freq: Three times a day (TID) | SUBCUTANEOUS | Status: DC
  Administered 2014-08-19 – 2014-08-22 (×6): 2 [IU] via SUBCUTANEOUS
  Administered 2014-08-22 (×2): 1 [IU] via SUBCUTANEOUS

## 2014-08-18 MED ORDER — IMIPRAMINE HCL 50 MG PO TABS
50.0000 mg | ORAL_TABLET | Freq: Every day | ORAL | Status: DC
  Administered 2014-08-18 – 2014-08-22 (×5): 50 mg via ORAL
  Filled 2014-08-18 (×6): qty 1

## 2014-08-18 MED ORDER — SODIUM CHLORIDE 0.9 % IJ SOLN: 3.0000 mL | INTRAMUSCULAR | Status: DC | PRN

## 2014-08-18 MED ORDER — FENTANYL CITRATE 0.05 MG/ML IJ SOLN
100.0000 ug | Freq: Once | INTRAMUSCULAR | Status: AC
  Administered 2014-08-18: 100 ug via INTRAMUSCULAR
  Filled 2014-08-18: qty 2

## 2014-08-18 NOTE — ED Notes (Signed)
Patient transported to X-ray 

## 2014-08-18 NOTE — ED Provider Notes (Signed)
CSN: [id number]     Arrival date & time 08/18/14  1252 History   First MD Initiated Contact with Patient 08/18/14 1255     Chief Complaint  Patient presents with  . Ankle Pain     (Consider location/radiation/quality/duration/timing/severity/associated sxs/prior Treatment) Patient is a 74 y.o. female presenting with ankle pain. The history is provided by the patient. No language interpreter was used.  Ankle Pain Location:  Ankle and foot Time since incident:  30 minutes Injury: no   Ankle location:  R ankle Foot location:  R foot Pain details:    Quality:  Aching   Radiates to:  Does not radiate   Severity:  Moderate   Onset quality:  Sudden   Duration: 30 mins.   Timing:  Constant   Progression:  Unchanged Chronicity:  New Dislocation: no   Foreign body present:  No foreign bodies Tetanus status:  Unknown Prior injury to area:  No Relieved by:  Rest Worsened by:  Activity Ineffective treatments:  None tried Associated symptoms: decreased ROM, stiffness and swelling   Associated symptoms: no back pain, no fatigue, no fever and no neck pain   Risk factors: obesity   Risk factors: no concern for non-accidental trauma, no frequent fractures, no known bone disorder and no recent illness     Past Medical History  Diagnosis Date  . Diabetes mellitus without complication   . Hypertension   . CHF (congestive heart failure)   . RBBB   . Dyslipidemia   . Morbid obesity   . Diastolic dysfunction   . Coronary artery disease     s/p CABG  . Hyperlipidemia   . OSA (obstructive sleep apnea)     mild OSA with AHI 7.15 now on CPAP at 16cm H2O   Past Surgical History  Procedure Laterality Date  . Stents      cardiac  . Cardiac surgery    . Knee surgery      bilateral  . Back surgery    . Rotator cuff surgery    . Coronary artery bypass graft    . Cardiac catheterization     Family History  Problem Relation Age of Onset  . Heart attack Mother   . Heart disease Mother    . Stomach cancer Father    History  Substance Use Topics  . Smoking status: Former Games developer  . Smokeless tobacco: Not on file  . Alcohol Use: No   OB History    No data available     Review of Systems  Constitutional: Negative for fever, chills, diaphoresis, activity change, appetite change and fatigue.  HENT: Negative for congestion, facial swelling, rhinorrhea and sore throat.   Eyes: Negative for photophobia and discharge.  Respiratory: Negative for cough, chest tightness and shortness of breath.   Cardiovascular: Negative for chest pain, palpitations and leg swelling.  Gastrointestinal: Negative for nausea, vomiting, abdominal pain and diarrhea.  Endocrine: Negative for polydipsia and polyuria.  Genitourinary: Negative for dysuria, frequency, difficulty urinating and pelvic pain.  Musculoskeletal: Positive for stiffness. Negative for back pain, arthralgias, neck pain and neck stiffness.  Skin: Negative for color change and wound.  Allergic/Immunologic: Negative for immunocompromised state.  Neurological: Negative for facial asymmetry, weakness, numbness and headaches.  Hematological: Does not bruise/bleed easily.  Psychiatric/Behavioral: Negative for confusion and agitation.      Allergies  Review of patient's allergies indicates no known allergies.  Home Medications   Prior to Admission medications   Medication Sig Start Date  End Date Taking? Authorizing Provider  albuterol-ipratropium (COMBIVENT) 18-103 MCG/ACT inhaler Inhale 2 puffs into the lungs every 6 (six) hours as needed for wheezing or shortness of breath.    Yes Historical Provider, MD  allopurinol (ZYLOPRIM) 100 MG tablet Take 100 mg by mouth daily.   Yes Historical Provider, MD  amLODipine-valsartan (EXFORGE) 10-320 MG per tablet Take 1 tablet by mouth daily. 03/22/14  Yes Quintella Reichert, MD  aspirin EC 325 MG tablet Take 1 tablet (325 mg total) by mouth daily. 03/22/14  Yes Quintella Reichert, MD  buPROPion  (WELLBUTRIN XL) 150 MG 24 hr tablet Take 150 mg by mouth daily.   Yes Historical Provider, MD  cilostazol (PLETAL) 100 MG tablet Take 100 mg by mouth 2 (two) times daily.   Yes Historical Provider, MD  clotrimazole-betamethasone (LOTRISONE) lotion Apply 1 application topically 2 (two) times daily. To rash at breast and groin area as directed   Yes Historical Provider, MD  ergocalciferol (VITAMIN D2) 50000 UNITS capsule Take 50,000 Units by mouth See admin instructions. Patient takes every Tuesday and Saturday   Yes Historical Provider, MD  esomeprazole (NEXIUM) 40 MG capsule Take 40 mg by mouth daily before breakfast.   Yes Historical Provider, MD  fluticasone (FLOVENT HFA) 220 MCG/ACT inhaler Inhale 1-2 puffs into the lungs 2 (two) times daily.   Yes Historical Provider, MD  furosemide (LASIX) 20 MG tablet Take 1 tablet (20 mg total) by mouth every other day. 10/04/13  Yes Quintella Reichert, MD  gabapentin (NEURONTIN) 300 MG capsule Take 300 mg by mouth 3 (three) times daily.   Yes Historical Provider, MD  imipramine (TOFRANIL) 10 MG tablet Take 50 mg by mouth at bedtime.   Yes Historical Provider, MD  metoprolol succinate (TOPROL XL) 25 MG 24 hr tablet Take 1 tablet (25 mg total) by mouth daily. 09/22/13  Yes Quintella Reichert, MD  nystatin-triamcinolone (MYCOLOG II) cream Apply 1 application topically 2 (two) times daily. To rash at groin and breast area as directed   Yes Historical Provider, MD  predniSONE (DELTASONE) 5 MG tablet Take 5 mg by mouth 2 (two) times daily. 03/08/14  Yes Historical Provider, MD  traZODone (DESYREL) 50 MG tablet Take 50-150 mg by mouth at bedtime as needed. As needed for sleep.  May increase to three tablets if needed (150mg )   Yes Historical Provider, MD  celecoxib (CELEBREX) 200 MG capsule Take 200 mg by mouth 2 (two) times daily. 01/19/14   Historical Provider, MD  HYDROcodone-acetaminophen (NORCO) 5-325 MG per tablet Take 1-2 tablets by mouth every 6 (six) hours as needed.  08/18/14   Toy Cookey, MD  simvastatin (ZOCOR) 10 MG tablet Take 1 tablet (10 mg total) by mouth at bedtime. 11/12/13   Quintella Reichert, MD   BP 195/63 mmHg  Pulse 56  Temp(Src) 98.1 F (36.7 C)  Resp 17  Ht 5\' 3"  (1.6 m)  Wt 275 lb (124.739 kg)  BMI 48.73 kg/m2  SpO2 96% Physical Exam  Constitutional: She is oriented to person, place, and time. She appears well-developed and well-nourished. No distress.  HENT:  Head: Normocephalic and atraumatic.  Mouth/Throat: No oropharyngeal exudate.  Eyes: Pupils are equal, round, and reactive to light.  Neck: Normal range of motion. Neck supple.  Cardiovascular: Normal rate, regular rhythm and normal heart sounds.  Exam reveals no gallop and no friction rub.   No murmur heard. Pulmonary/Chest: Effort normal and breath sounds normal. No respiratory distress. She has no  wheezes. She has no rales.  Abdominal: Soft. Bowel sounds are normal. She exhibits no distension and no mass. There is no tenderness. There is no rebound and no guarding.  Musculoskeletal: Normal range of motion. She exhibits no edema.       Right knee: Tenderness found. Medial joint line and lateral joint line tenderness noted.       Right ankle: Tenderness. Lateral malleolus and medial malleolus tenderness found.  Neurological: She is alert and oriented to person, place, and time.  Skin: Skin is warm and dry.  Psychiatric: She has a normal mood and affect.    ED Course  Procedures (including critical care time) Labs Review Labs Reviewed  CBC  COMPREHENSIVE METABOLIC PANEL    Imaging Review Dg Ankle Complete Right  08/18/2014   CLINICAL DATA:  Pain and swelling after fall  EXAM: RIGHT ANKLE - COMPLETE 3+ VIEW  COMPARISON:  None.  FINDINGS: Frontal, oblique, and lateral views were obtained. There is a fracture of the distal fibular diaphysis with posterior and lateral displacement of the distal fracture fragment with respect proximal fragment. There is an obliquely  oriented fracture of the medial malleolus which is mildly displaced distally. There is a subtle fracture of the posterior tibial malleolus in essentially anatomic alignment. The ankle mortise appears grossly intact. There are spurs arising from the calcaneus. There are foci of vascular calcification.  IMPRESSION: Fracture of the medial malleolus and distal fibular diaphysis. Subtle fracture posterior tibial malleolus. Ankle mortise grossly intact. Extensive arterial vascular calcification. Calcaneal spurs present.   Electronically Signed   By: Bretta BangWilliam  Woodruff III M.D.   On: 08/18/2014 14:12   Dg Knee Complete 4 Views Right  08/18/2014   CLINICAL DATA:  Pain and swelling after fall  EXAM: RIGHT KNEE - COMPLETE 4+ VIEW  COMPARISON:  May 13, 2013  FINDINGS: Frontal, lateral, and bilateral oblique views were obtained. 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 There is evidence of an old fracture along the superior aspect of the patella with fragmentation in this area. The appearance in this area is stable. It should be noted that subtle re-injury in this area could be easily obscured given the prior injury. No acute fracture is seen. No dislocation. There is a joint effusion currently.  There is extensive arterial vascular calcification.  IMPRESSION: Moderate joint effusion. Evidence of old trauma to the superior patella compared to the previous study. Re-injury in this area cannot be excluded, however. No other evidence of fracture. No dislocation.   Electronically Signed   By: Bretta BangWilliam  Woodruff III M.D.   On: 08/18/2014 14:16     EKG Interpretation None      MDM   Final diagnoses:  Fall  Right ankle pain  Recurrent knee pain  Closed right ankle fracture, initial encounter    Pt is a 74 y.o. female with Pmhx as above who presents with R ankle pain/swelling after her R leg gave out while getting in her care. No LOC/CP, SOB. NVI  distally, +lateral/medial malleolar tenderness as well as R knee tenderness which pt says is chronic. XR with Fx of medial and lateral malleolar fx as well as subtle posterior tib mall fx.  I spoke with the social worker, we are unable to get the patient home with a wheelchair.  She is currently unable to ambulate due to injury/age, and plan for admission to [facility].  Her orthopedist, Dr. Myrtie NeitherArthur Carter, is aware of the patient  and will see in hospital tomorrow.       Toy Cookey, MD 08/18/14 1739

## 2014-08-18 NOTE — ED Notes (Signed)
Patient was at home and getting into the car and her" Rt leg gave out and twisted" . Vitals with EMS: 170/80 HR 72 98% RA RR20. Swelling to the  Ankle on arrival

## 2014-08-18 NOTE — H&P (Signed)
[facility] History and Physical  TIAH HECKEL WUJ:[id number] DOB: [birth year] DOA: 08/18/2014  Referring physician: Dr. Toy Cookey PCP: Laurena Slimmer, MD  Specialists:   Chief Complaint: Fall  HPI: Lisa [name] is a 74 y.o. female  With a history of hypertension, diabetes, coronary artery disease that presents to the emergency department after falling.  Patient states she was attempting to get into her friend's car at which point her ankle rolled and she fell. She denies feeling dizzy, shortness of breath, chest pain prior to the episode. Patient does state that she has frequent pains due to arthritis. X-ray done the ER the right ankle does show fracture. Orthopedics, Dr. Montez Morita was called. [facility] was asked to admit.  Review of Systems:  Constitutional: Denies fever, chills, diaphoresis, appetite change and fatigue.  HEENT: Denies photophobia, eye pain, redness, hearing loss, ear pain, congestion, sore throat, rhinorrhea, sneezing, mouth sores, trouble swallowing, neck pain, neck stiffness and tinnitus.   Respiratory: Denies SOB, DOE, cough, chest tightness,  and wheezing.   Cardiovascular: Denies chest pain, palpitations and leg swelling.  Gastrointestinal: Denies nausea, vomiting, abdominal pain, diarrhea, constipation, blood in stool and abdominal distention.  Genitourinary: Denies dysuria, urgency, frequency, hematuria, flank pain and difficulty urinating.  Musculoskeletal: Patient complains of ankle pain as well as arthritic pain. Skin: Denies pallor, rash and wound.  Neurological: Denies dizziness, seizures, syncope, weakness, light-headedness, numbness and headaches.  Hematological: Denies adenopathy. Easy bruising, personal or family bleeding history  Psychiatric/Behavioral: Denies suicidal ideation, mood changes, confusion, nervousness, sleep disturbance and agitation  Past Medical History  Diagnosis Date  . Diabetes mellitus without complication   .  Hypertension   . CHF (congestive heart failure)   . RBBB   . Dyslipidemia   . Morbid obesity   . Diastolic dysfunction   . Coronary artery disease     s/p CABG  . Hyperlipidemia   . OSA (obstructive sleep apnea)     mild OSA with AHI 7.15 now on CPAP at 16cm H2O   Past Surgical History  Procedure Laterality Date  . Stents      cardiac  . Cardiac surgery    . Knee surgery      bilateral  . Back surgery    . Rotator cuff surgery    . Coronary artery bypass graft    . Cardiac catheterization     Social History:  reports that she has quit smoking. She does not have any smokeless tobacco history on file. She reports that she does not drink alcohol or use illicit drugs. Patient lives at home alone, has caretaker that comes in 2-3 hours per day.  No Known Allergies  Family History  Problem Relation Age of Onset  . Heart attack Mother   . Heart disease Mother   . Stomach cancer Father     Prior to Admission medications   Medication Sig Start Date End Date Taking? Authorizing Provider  albuterol-ipratropium (COMBIVENT) 18-103 MCG/ACT inhaler Inhale 2 puffs into the lungs every 6 (six) hours as needed for wheezing or shortness of breath.    Yes Historical Provider, MD  allopurinol (ZYLOPRIM) 100 MG tablet Take 100 mg by mouth daily.   Yes Historical Provider, MD  amLODipine-valsartan (EXFORGE) 10-320 MG per tablet Take 1 tablet by mouth daily. 03/22/14  Yes Quintella Reichert, MD  aspirin EC 325 MG tablet Take 1 tablet (325 mg total) by mouth daily. 03/22/14  Yes Quintella Reichert, MD  buPROPion (WELLBUTRIN XL) 150 MG  24 hr tablet Take 150 mg by mouth daily.   Yes Historical Provider, MD  cilostazol (PLETAL) 100 MG tablet Take 100 mg by mouth 2 (two) times daily.   Yes Historical Provider, MD  clotrimazole-betamethasone (LOTRISONE) lotion Apply 1 application topically 2 (two) times daily. To rash at breast and groin area as directed   Yes Historical Provider, MD  ergocalciferol (VITAMIN D2)  50000 UNITS capsule Take 50,000 Units by mouth See admin instructions. Patient takes every Tuesday and Saturday   Yes Historical Provider, MD  esomeprazole (NEXIUM) 40 MG capsule Take 40 mg by mouth daily before breakfast.   Yes Historical Provider, MD  fluticasone (FLOVENT HFA) 220 MCG/ACT inhaler Inhale 1-2 puffs into the lungs 2 (two) times daily.   Yes Historical Provider, MD  furosemide (LASIX) 20 MG tablet Take 1 tablet (20 mg total) by mouth every other day. 10/04/13  Yes Quintella Reichertraci R Turner, MD  gabapentin (NEURONTIN) 300 MG capsule Take 300 mg by mouth 3 (three) times daily.   Yes Historical Provider, MD  imipramine (TOFRANIL) 10 MG tablet Take 50 mg by mouth at bedtime.   Yes Historical Provider, MD  metoprolol succinate (TOPROL XL) 25 MG 24 hr tablet Take 1 tablet (25 mg total) by mouth daily. 09/22/13  Yes Quintella Reichertraci R Turner, MD  nystatin-triamcinolone (MYCOLOG II) cream Apply 1 application topically 2 (two) times daily. To rash at groin and breast area as directed   Yes Historical Provider, MD  predniSONE (DELTASONE) 5 MG tablet Take 5 mg by mouth 2 (two) times daily. 03/08/14  Yes Historical Provider, MD  traZODone (DESYREL) 50 MG tablet Take 50-150 mg by mouth at bedtime as needed. As needed for sleep.  May increase to three tablets if needed (150mg )   Yes Historical Provider, MD  celecoxib (CELEBREX) 200 MG capsule Take 200 mg by mouth 2 (two) times daily. 01/19/14   Historical Provider, MD  HYDROcodone-acetaminophen (NORCO) 5-325 MG per tablet Take 1-2 tablets by mouth every 6 (six) hours as needed. 08/18/14   Toy CookeyMegan Docherty, MD  simvastatin (ZOCOR) 10 MG tablet Take 1 tablet (10 mg total) by mouth at bedtime. 11/12/13   Quintella Reichertraci R Turner, MD   Physical Exam: Filed Vitals:   08/18/14 1445  BP: 195/63  Pulse: 56  Temp:   Resp:      General: Well developed, well nourished, NAD, appears stated age  HEENT: NCAT, PERRLA, EOMI, Anicteic Sclera, mucous membranes moist.   Neck: Supple, no JVD, no  masses  Cardiovascular: S1 S2 auscultated, no rubs, murmurs or gallops. Regular rate and rhythm.  Respiratory: Clear to auscultation bilaterally with equal chest rise  Abdomen: Soft, nontender, nondistended, + bowel sounds  Extremities: warm dry without cyanosis clubbing or edema  Neuro: AAOx3, cranial nerves grossly intact. Strength equal and bilateral in upper ext and LLE.  RLE not tested due to pain.    Skin: Without rashes exudates or nodules  Psych: Normal affect and demeanor with intact judgement and insight  Labs on Admission:  Basic Metabolic Panel: No results for input(s): NA, K, CL, CO2, GLUCOSE, BUN, CREATININE, CALCIUM, MG, PHOS in the last 168 hours. Liver Function Tests: No results for input(s): AST, ALT, ALKPHOS, BILITOT, PROT, ALBUMIN in the last 168 hours. No results for input(s): LIPASE, AMYLASE in the last 168 hours. No results for input(s): AMMONIA in the last 168 hours. CBC: No results for input(s): WBC, NEUTROABS, HGB, HCT, MCV, PLT in the last 168 hours. Cardiac Enzymes: No results for  input(s): CKTOTAL, CKMB, CKMBINDEX, TROPONINI in the last 168 hours.  BNP (last 3 results) No results for input(s): BNP in the last 8760 hours.  ProBNP (last 3 results)  Recent Labs  09/22/13 1448 09/30/13 1359  PROBNP 146.0* 178.0*    CBG: No results for input(s): GLUCAP in the last 168 hours.  Radiological Exams on Admission: Dg Ankle Complete Right  08/18/2014   CLINICAL DATA:  Pain and swelling after fall  EXAM: RIGHT ANKLE - COMPLETE 3+ VIEW  COMPARISON:  None.  FINDINGS: Frontal, oblique, and lateral views were obtained. There is a fracture of the distal fibular diaphysis with posterior and lateral displacement of the distal fracture fragment with respect proximal fragment. There is an obliquely oriented fracture of the medial malleolus which is mildly displaced distally. There is a subtle fracture of the posterior tibial malleolus in essentially anatomic  alignment. The ankle mortise appears grossly intact. There are spurs arising from the calcaneus. There are foci of vascular calcification.  IMPRESSION: Fracture of the medial malleolus and distal fibular diaphysis. Subtle fracture posterior tibial malleolus. Ankle mortise grossly intact. Extensive arterial vascular calcification. Calcaneal spurs present.   Electronically Signed   By: Bretta Bang III M.D.   On: 08/18/2014 14:12   Dg Knee Complete 4 Views Right  08/18/2014   CLINICAL DATA:  Pain and swelling after fall  EXAM: RIGHT KNEE - COMPLETE 4+ VIEW  COMPARISON:  May 13, 2013  FINDINGS: Frontal, lateral, and bilateral oblique views were obtained. 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 There is evidence of an old fracture along the superior aspect of the patella with fragmentation in this area. The appearance in this area is stable. It should be noted that subtle re-injury in this area could be easily obscured given the prior injury. No acute fracture is seen. No dislocation. There is a joint effusion currently.  There is extensive arterial vascular calcification.  IMPRESSION: Moderate joint effusion. Evidence of old trauma to the superior patella compared to the previous study. Re-injury in this area cannot be excluded, however. No other evidence of fracture. No dislocation.   Electronically Signed   By: Bretta Bang III M.D.   On: 08/18/2014 14:16    EKG: none  Assessment/Plan  Right ankle fracture -Patient will be admitted for observation -Patient's orthopedist, Dr. Montez Morita made aware of patient's admission -X-ray of the right ankle shows fracture of the medial malleolus and distal fibular diaphysis, subtle fracture posterior tibial malleolus -Will provide pain control -Patient currently has short leg splint in place -Will consult PT and OT as well as social work and case management -Patient does live alone and has a  aide that assists her 7 days a week for 2-3 hours per day.  Essential Hypertension -Continue valsartan, amlodipine, metoprolol, Lasix  Diabetes mellitus, type II, diet controlled -Will place patient on insulin sliding scale was CBG monitoring -Will obtain hemoglobin A1c  GERD -Continue PPI  Morbid obesity -Should be discussed with her primary care physician  History of coronary artery disease -Patient denies any chest pain at this time -Continue home medications, aspirin, ARB, beta blocker, statin  Obstructive sleep apnea -Continue sleep apnea daily at bedtime  Chronic diastolic heart failure -Echocardiogram in April 2015 shows an EF of 50-55%, normal systolic function -Will monitor daily weights, intake and output -Patient appears compensated and euvolemic at this time -Continue Lasix  DVT prophylaxis: Lovenox  Code Status: Full  Condition: Guarded  Family Communication: None at bedside.  Admission, patients condition and plan of care including tests being ordered have been discussed with the patient, who indicates understanding and agrees with the plan and Code Status.  Disposition Plan: Admitted for observation.    Time spent: 45 minutes  Michiah Mudry D.O. [facility] Pager [phone]  If 7PM-7AM, please contact night-coverage [email or web address] Password [facility] 08/18/2014, 5:59 PM

## 2014-08-18 NOTE — Progress Notes (Signed)
Orthopedic Tech Progress Note Patient Details:  Lisa [name] 1940/11/20 [id number]  Ortho Devices Type of Ortho Device: Ace wrap, Post (short leg) splint, Stirrup splint Ortho Device/Splint Location: RLE Ortho Device/Splint Interventions: Ordered, Application   Lisa [name], Lisa [name] 08/18/2014, 5:02 PM

## 2014-08-18 NOTE — ED Notes (Signed)
Spoke with Ortho, will be down shortly.

## 2014-08-18 NOTE — Progress Notes (Signed)
ED CM consulted by Dr. Tawnya Crook on [facility] concerning recommendation for [facility] services. Patient present to [facility] ED s/p fall with right ankle pain while getting out of her car. Reviewed record, Medicare payor source.  Patient sustained a ankle fracture and is not able to weight bear. Ortho tech placed Ace wrap, Post (short leg) splint, Stirrup splint.  EDP attempted to reach her Ortho doctor. Met with patient at bedside, confirmed information in record. Patient states, she lives alone and  cannot walk, or use her walker at home., Discussed possible use of a wheel chair but unable to order and have w/c to be delivered until tomorrow. Updated Dr. Tawnya Crook that recommendation for [facility] services  without a w/c tonight may not be a safe disposition plan. EDP will consult with [facility]. Unit CM will follow for final disposition plan.

## 2014-08-19 ENCOUNTER — Observation Stay (HOSPITAL_COMMUNITY): Payer: Medicare Other | Admitting: Certified Registered"

## 2014-08-19 ENCOUNTER — Encounter (HOSPITAL_COMMUNITY)
Admission: EM | Disposition: A | Discharge status: Home Health | Payer: Self-pay | Source: Home / Self Care | Attending: Internal Medicine

## 2014-08-19 ENCOUNTER — Observation Stay (HOSPITAL_COMMUNITY): Payer: Medicare Other

## 2014-08-19 ENCOUNTER — Encounter (HOSPITAL_COMMUNITY): Payer: Self-pay | Admitting: Certified Registered"

## 2014-08-19 DIAGNOSIS — N390 Urinary tract infection, site not specified: Secondary | ICD-10-CM | POA: Diagnosis present

## 2014-08-19 DIAGNOSIS — Z6841 Body Mass Index (BMI) 40.0 and over, adult: Secondary | ICD-10-CM | POA: Diagnosis not present

## 2014-08-19 DIAGNOSIS — G4733 Obstructive sleep apnea (adult) (pediatric): Secondary | ICD-10-CM | POA: Diagnosis present

## 2014-08-19 DIAGNOSIS — W19XXXA Unspecified fall, initial encounter: Secondary | ICD-10-CM | POA: Diagnosis present

## 2014-08-19 DIAGNOSIS — E785 Hyperlipidemia, unspecified: Secondary | ICD-10-CM | POA: Diagnosis present

## 2014-08-19 DIAGNOSIS — Z713 Dietary counseling and surveillance: Secondary | ICD-10-CM | POA: Diagnosis present

## 2014-08-19 DIAGNOSIS — Z8249 Family history of ischemic heart disease and other diseases of the circulatory system: Secondary | ICD-10-CM | POA: Diagnosis not present

## 2014-08-19 DIAGNOSIS — D649 Anemia, unspecified: Secondary | ICD-10-CM | POA: Diagnosis not present

## 2014-08-19 DIAGNOSIS — S82851A Displaced trimalleolar fracture of right lower leg, initial encounter for closed fracture: Secondary | ICD-10-CM | POA: Diagnosis present

## 2014-08-19 DIAGNOSIS — I4510 Unspecified right bundle-branch block: Secondary | ICD-10-CM | POA: Diagnosis present

## 2014-08-19 DIAGNOSIS — Z951 Presence of aortocoronary bypass graft: Secondary | ICD-10-CM | POA: Diagnosis not present

## 2014-08-19 DIAGNOSIS — Z7982 Long term (current) use of aspirin: Secondary | ICD-10-CM | POA: Diagnosis not present

## 2014-08-19 DIAGNOSIS — E119 Type 2 diabetes mellitus without complications: Secondary | ICD-10-CM | POA: Diagnosis present

## 2014-08-19 DIAGNOSIS — I2510 Atherosclerotic heart disease of native coronary artery without angina pectoris: Secondary | ICD-10-CM | POA: Diagnosis present

## 2014-08-19 DIAGNOSIS — Z955 Presence of coronary angioplasty implant and graft: Secondary | ICD-10-CM | POA: Diagnosis not present

## 2014-08-19 DIAGNOSIS — E875 Hyperkalemia: Secondary | ICD-10-CM | POA: Diagnosis present

## 2014-08-19 DIAGNOSIS — N281 Cyst of kidney, acquired: Secondary | ICD-10-CM | POA: Diagnosis present

## 2014-08-19 DIAGNOSIS — I5032 Chronic diastolic (congestive) heart failure: Secondary | ICD-10-CM | POA: Diagnosis present

## 2014-08-19 DIAGNOSIS — I519 Heart disease, unspecified: Secondary | ICD-10-CM

## 2014-08-19 DIAGNOSIS — I129 Hypertensive chronic kidney disease with stage 1 through stage 4 chronic kidney disease, or unspecified chronic kidney disease: Secondary | ICD-10-CM | POA: Diagnosis present

## 2014-08-19 DIAGNOSIS — K219 Gastro-esophageal reflux disease without esophagitis: Secondary | ICD-10-CM | POA: Diagnosis present

## 2014-08-19 DIAGNOSIS — Z7952 Long term (current) use of systemic steroids: Secondary | ICD-10-CM | POA: Diagnosis not present

## 2014-08-19 DIAGNOSIS — Z87891 Personal history of nicotine dependence: Secondary | ICD-10-CM | POA: Diagnosis not present

## 2014-08-19 DIAGNOSIS — N179 Acute kidney failure, unspecified: Secondary | ICD-10-CM | POA: Diagnosis not present

## 2014-08-19 DIAGNOSIS — N183 Chronic kidney disease, stage 3 (moderate): Secondary | ICD-10-CM | POA: Diagnosis present

## 2014-08-19 HISTORY — PX: ORIF ANKLE FRACTURE: SHX5408

## 2014-08-19 LAB — BASIC METABOLIC PANEL
Anion gap: 6 (ref 5–15)
BUN: 15 mg/dL (ref 6–23)
CALCIUM: 8.5 mg/dL (ref 8.4–10.5)
CO2: 25 mmol/L (ref 19–32)
CREATININE: 1.34 mg/dL — AB (ref 0.50–1.10)
Chloride: 107 mmol/L (ref 96–112)
GFR, EST AFRICAN AMERICAN: 44 mL/min — AB (ref >90)
GFR, EST NON AFRICAN AMERICAN: 38 mL/min — AB (ref >90)
Glucose, Bld: 171 mg/dL — ABNORMAL HIGH (ref 70–99)
POTASSIUM: 5.2 mmol/L — AB (ref 3.5–5.1)
Sodium: 138 mmol/L (ref 135–145)

## 2014-08-19 LAB — SURGICAL PCR SCREEN
MRSA, PCR: NEGATIVE
STAPHYLOCOCCUS AUREUS: NEGATIVE

## 2014-08-19 LAB — GLUCOSE, CAPILLARY
GLUCOSE-CAPILLARY: 169 mg/dL — AB (ref 70–99)
GLUCOSE-CAPILLARY: 154 mg/dL — AB (ref 70–99)
Glucose-Capillary: 168 mg/dL — ABNORMAL HIGH (ref 70–99)
Glucose-Capillary: 114 mg/dL — ABNORMAL HIGH (ref 70–99)

## 2014-08-19 LAB — CBC
HEMATOCRIT: 32.1 % — AB (ref 36.0–46.0)
HEMOGLOBIN: 10.5 g/dL — AB (ref 12.0–15.0)
MCH: 31.6 pg (ref 26.0–34.0)
MCHC: 32.7 g/dL (ref 30.0–36.0)
MCV: 96.7 fL (ref 78.0–100.0)
PLATELETS: 162 10*3/uL (ref 150–400)
RBC: 3.32 MIL/uL — ABNORMAL LOW (ref 3.87–5.11)
RDW: 13.5 % (ref 11.5–15.5)
WBC: 6.3 10*3/uL (ref 4.0–10.5)

## 2014-08-19 LAB — POTASSIUM: Potassium: 5.4 mmol/L — ABNORMAL HIGH (ref 3.5–5.1)

## 2014-08-19 SURGERY — OPEN REDUCTION INTERNAL FIXATION (ORIF) ANKLE FRACTURE
Anesthesia: Regional | Site: Ankle | Laterality: Right

## 2014-08-19 MED ORDER — EPHEDRINE SULFATE 50 MG/ML IJ SOLN
INTRAMUSCULAR | Status: DC | PRN
  Administered 2014-08-19 (×5): 10 mg via INTRAVENOUS

## 2014-08-19 MED ORDER — MIDAZOLAM HCL 2 MG/2ML IJ SOLN
INTRAMUSCULAR | Status: AC
  Filled 2014-08-19: qty 2

## 2014-08-19 MED ORDER — 0.9 % SODIUM CHLORIDE (POUR BTL) OPTIME
TOPICAL | Status: DC | PRN
  Administered 2014-08-19: 1000 mL

## 2014-08-19 MED ORDER — DEXAMETHASONE SODIUM PHOSPHATE 4 MG/ML IJ SOLN
INTRAMUSCULAR | Status: DC | PRN
  Administered 2014-08-19: 4 mg via INTRAVENOUS

## 2014-08-19 MED ORDER — SUFENTANIL CITRATE 50 MCG/ML IV SOLN
INTRAVENOUS | Status: AC
  Filled 2014-08-19: qty 1

## 2014-08-19 MED ORDER — LACTATED RINGERS IV SOLN
INTRAVENOUS | Status: DC | PRN
  Administered 2014-08-19 (×2): via INTRAVENOUS

## 2014-08-19 MED ORDER — GLYCOPYRROLATE 0.2 MG/ML IJ SOLN
INTRAMUSCULAR | Status: DC | PRN
  Administered 2014-08-19: 0.2 mg via INTRAVENOUS

## 2014-08-19 MED ORDER — CEFAZOLIN SODIUM-DEXTROSE 2-3 GM-% IV SOLR
INTRAVENOUS | Status: AC
  Filled 2014-08-19: qty 50

## 2014-08-19 MED ORDER — PROPOFOL 10 MG/ML IV BOLUS
INTRAVENOUS | Status: AC
  Filled 2014-08-19: qty 20

## 2014-08-19 MED ORDER — SUFENTANIL CITRATE 50 MCG/ML IV SOLN
INTRAVENOUS | Status: DC | PRN
  Administered 2014-08-19: 10 ug via INTRAVENOUS

## 2014-08-19 MED ORDER — CEFAZOLIN SODIUM-DEXTROSE 2-3 GM-% IV SOLR
INTRAVENOUS | Status: DC | PRN
  Administered 2014-08-19: 2 g via INTRAVENOUS

## 2014-08-19 MED ORDER — HYDROMORPHONE HCL 1 MG/ML IJ SOLN
0.2500 mg | INTRAMUSCULAR | Status: DC | PRN
Start: 2014-08-19 — End: 2014-08-23
  Administered 2014-08-20 (×3): 0.5 mg via INTRAVENOUS

## 2014-08-19 MED ORDER — ONDANSETRON HCL 4 MG/2ML IJ SOLN
INTRAMUSCULAR | Status: DC | PRN
  Administered 2014-08-19: 4 mg via INTRAVENOUS

## 2014-08-19 MED ORDER — GLYCOPYRROLATE 0.2 MG/ML IJ SOLN
INTRAMUSCULAR | Status: AC
  Filled 2014-08-19: qty 1

## 2014-08-19 MED ORDER — SUCCINYLCHOLINE CHLORIDE 20 MG/ML IJ SOLN
INTRAMUSCULAR | Status: AC
  Filled 2014-08-19: qty 1

## 2014-08-19 MED ORDER — SODIUM CHLORIDE 0.9 % IV SOLN
INTRAVENOUS | Status: AC
  Administered 2014-08-19 – 2014-08-22 (×5): via INTRAVENOUS

## 2014-08-19 MED ORDER — ONDANSETRON HCL 4 MG/2ML IJ SOLN
INTRAMUSCULAR | Status: AC
  Filled 2014-08-19: qty 2

## 2014-08-19 MED ORDER — SODIUM CHLORIDE 0.9 % IJ SOLN
INTRAMUSCULAR | Status: AC
  Filled 2014-08-19: qty 20

## 2014-08-19 MED ORDER — DEXAMETHASONE SODIUM PHOSPHATE 4 MG/ML IJ SOLN
INTRAMUSCULAR | Status: AC
  Filled 2014-08-19: qty 1

## 2014-08-19 MED ORDER — OXYCODONE HCL 5 MG PO TABS: 5.0000 mg | ORAL_TABLET | Freq: Once | ORAL | Status: AC | PRN

## 2014-08-19 MED ORDER — MIDAZOLAM HCL 5 MG/5ML IJ SOLN
INTRAMUSCULAR | Status: DC | PRN
  Administered 2014-08-19: 2 mg via INTRAVENOUS

## 2014-08-19 MED ORDER — SUCCINYLCHOLINE CHLORIDE 20 MG/ML IJ SOLN
INTRAMUSCULAR | Status: DC | PRN
  Administered 2014-08-19: 120 mg via INTRAVENOUS

## 2014-08-19 MED ORDER — ONDANSETRON HCL 4 MG/2ML IJ SOLN: 4.0000 mg | Freq: Four times a day (QID) | INTRAMUSCULAR | Status: DC | PRN

## 2014-08-19 MED ORDER — SODIUM POLYSTYRENE SULFONATE 15 GM/60ML PO SUSP
15.0000 g | Freq: Once | ORAL | Status: AC
  Administered 2014-08-19: 15 g via ORAL
  Filled 2014-08-19: qty 60

## 2014-08-19 MED ORDER — LIDOCAINE HCL (CARDIAC) 20 MG/ML IV SOLN
INTRAVENOUS | Status: DC | PRN
  Administered 2014-08-19: 100 mg via INTRAVENOUS

## 2014-08-19 MED ORDER — EPHEDRINE SULFATE 50 MG/ML IJ SOLN
INTRAMUSCULAR | Status: AC
  Filled 2014-08-19: qty 1

## 2014-08-19 MED ORDER — PROPOFOL 10 MG/ML IV BOLUS
INTRAVENOUS | Status: DC | PRN
  Administered 2014-08-19: 120 mg via INTRAVENOUS
  Administered 2014-08-19: 30 mg via INTRAVENOUS

## 2014-08-19 MED ORDER — OXYCODONE HCL 5 MG/5ML PO SOLN
5.0000 mg | Freq: Once | ORAL | Status: AC | PRN
Start: 2014-08-19 — End: 2014-08-19

## 2014-08-19 SURGICAL SUPPLY — 78 items
BANDAGE ELASTIC 4 VELCRO ST LF (GAUZE/BANDAGES/DRESSINGS) ×3 IMPLANT
BANDAGE ELASTIC 6 VELCRO ST LF (GAUZE/BANDAGES/DRESSINGS) ×3 IMPLANT
BIT DRILL 2.7 QC CANN 155 (BIT) ×1 IMPLANT
BIT DRILL 2.7 QC CANN 155MM (BIT) ×1
BIT DRILL 3.5 QC 155 (BIT) ×1 IMPLANT
BIT DRILL 3.5 QC 155MM (BIT) ×1
BIT DRILL QC 2.7 6.3IN  SHORT (BIT) ×2
BIT DRILL QC 2.7 6.3IN SHORT (BIT) IMPLANT
BLADE SURG 10 STRL SS (BLADE) ×2 IMPLANT
BNDG CMPR 9X4 STRL LF SNTH (GAUZE/BANDAGES/DRESSINGS) ×1
BNDG COHESIVE 6X5 TAN STRL LF (GAUZE/BANDAGES/DRESSINGS) ×3 IMPLANT
BNDG ESMARK 4X9 LF (GAUZE/BANDAGES/DRESSINGS) ×3 IMPLANT
BNDG GAUZE ELAST 4 BULKY (GAUZE/BANDAGES/DRESSINGS) ×3 IMPLANT
COVER MAYO STAND STRL (DRAPES) ×3 IMPLANT
COVER SURGICAL LIGHT HANDLE (MISCELLANEOUS) ×3 IMPLANT
CUFF TOURNIQUET SINGLE 34IN LL (TOURNIQUET CUFF) ×2 IMPLANT
CUFF TOURNIQUET SINGLE 44IN (TOURNIQUET CUFF) IMPLANT
DRAPE C-ARM 42X72 X-RAY (DRAPES) ×3 IMPLANT
DRAPE INCISE IOBAN 66X45 STRL (DRAPES) ×2 IMPLANT
DRAPE SURG 17X23 STRL (DRAPES) ×3 IMPLANT
DRAPE U-SHAPE 47X51 STRL (DRAPES) ×3 IMPLANT
DRSG PAD ABDOMINAL 8X10 ST (GAUZE/BANDAGES/DRESSINGS) ×3 IMPLANT
DURAPREP 26ML APPLICATOR (WOUND CARE) ×2 IMPLANT
ELECT REM PT RETURN 9FT ADLT (ELECTROSURGICAL) ×3
ELECTRODE REM PT RTRN 9FT ADLT (ELECTROSURGICAL) ×1 IMPLANT
FACESHIELD WRAPAROUND (MASK) ×3 IMPLANT
FACESHIELD WRAPAROUND OR TEAM (MASK) ×1 IMPLANT
GAUZE SPONGE 4X4 12PLY STRL (GAUZE/BANDAGES/DRESSINGS) ×3 IMPLANT
GAUZE XEROFORM 5X9 LF (GAUZE/BANDAGES/DRESSINGS) ×3 IMPLANT
GLOVE BIOGEL PI IND STRL 8 (GLOVE) ×1 IMPLANT
GLOVE BIOGEL PI INDICATOR 8 (GLOVE) ×2
GLOVE SURG ORTHO 8.0 STRL STRW (GLOVE) ×3 IMPLANT
GOWN STRL REUS W/ TWL LRG LVL3 (GOWN DISPOSABLE) ×3 IMPLANT
GOWN STRL REUS W/TWL LRG LVL3 (GOWN DISPOSABLE) ×6
GUIDE PIN 1.3 (Pin) ×6 IMPLANT
HANDPIECE INTERPULSE COAX TIP (DISPOSABLE)
KIT BASIN OR (CUSTOM PROCEDURE TRAY) ×3 IMPLANT
KIT ROOM TURNOVER OR (KITS) ×3 IMPLANT
MANIFOLD NEPTUNE II (INSTRUMENTS) ×3 IMPLANT
NDL HYPO 25GX1X1/2 BEV (NEEDLE) ×1 IMPLANT
NEEDLE HYPO 25GX1X1/2 BEV (NEEDLE) IMPLANT
NS IRRIG 1000ML POUR BTL (IV SOLUTION) ×3 IMPLANT
PACK ORTHO EXTREMITY (CUSTOM PROCEDURE TRAY) ×3 IMPLANT
PAD ABD 8X10 STRL (GAUZE/BANDAGES/DRESSINGS) ×2 IMPLANT
PAD ARMBOARD 7.5X6 YLW CONV (MISCELLANEOUS) ×6 IMPLANT
PAD CAST 4YDX4 CTTN HI CHSV (CAST SUPPLIES) ×2 IMPLANT
PADDING CAST ABS 4INX4YD NS (CAST SUPPLIES) ×2
PADDING CAST ABS 6INX4YD NS (CAST SUPPLIES) ×2
PADDING CAST ABS COTTON 4X4 ST (CAST SUPPLIES) IMPLANT
PADDING CAST ABS COTTON 6X4 NS (CAST SUPPLIES) IMPLANT
PADDING CAST COTTON 4X4 STRL (CAST SUPPLIES) ×6
PIN GUIDE 1.3 (Pin) IMPLANT
PLATE FIBULA DISTAL 7 HOLE (Plate) ×2 IMPLANT
SCREW CANCELLOUS 5.0X16MM (Screw) ×2 IMPLANT
SCREW LOCK 12X3.5XST PRLC (Screw) IMPLANT
SCREW LOCK 3.5X12 (Screw) ×6 IMPLANT
SCREW LOCK 3.5X8 (Screw) ×2 IMPLANT
SCREW NL 3.5X14 (Screw) ×2 IMPLANT
SCREW NON LOCK 3.5X12 (Screw) ×8 IMPLANT
SCREW NON LOCK 3.5X20 (Screw) ×4 IMPLANT
SCREW SHORT THREAD 4.0X40 (Screw) ×4 IMPLANT
SET HNDPC FAN SPRY TIP SCT (DISPOSABLE) IMPLANT
SPLINT PLASTER CAST XFAST 5X30 (CAST SUPPLIES) IMPLANT
SPLINT PLASTER XFAST SET 5X30 (CAST SUPPLIES) ×2
SPONGE GAUZE 4X4 12PLY STER LF (GAUZE/BANDAGES/DRESSINGS) ×2 IMPLANT
STOCKINETTE IMPERVIOUS 9X36 MD (GAUZE/BANDAGES/DRESSINGS) ×2 IMPLANT
SUCTION FRAZIER TIP 10 FR DISP (SUCTIONS) ×3 IMPLANT
SUT ETHILON 3 0 PS 1 (SUTURE) ×6 IMPLANT
SUT VIC AB 2-0 CTB1 (SUTURE) ×6 IMPLANT
SUT VIC AB 3-0 SH 27 (SUTURE) ×3
SUT VIC AB 3-0 SH 27X BRD (SUTURE) ×2 IMPLANT
SYR CONTROL 10ML LL (SYRINGE) ×1 IMPLANT
TOWEL OR 17X24 6PK STRL BLUE (TOWEL DISPOSABLE) ×3 IMPLANT
TOWEL OR 17X26 10 PK STRL BLUE (TOWEL DISPOSABLE) ×3 IMPLANT
TUBE CONNECTING 12'X1/4 (SUCTIONS) ×1
TUBE CONNECTING 12X1/4 (SUCTIONS) ×2 IMPLANT
WATER STERILE IRR 1000ML POUR (IV SOLUTION) ×1 IMPLANT
YANKAUER SUCT BULB TIP NO VENT (SUCTIONS) ×2 IMPLANT

## 2014-08-19 NOTE — Progress Notes (Signed)
[facility]                                                                              Patient Demographics  Lisa [name], is a 74 y.o. female, DOB - 04-Feb-1941, ZOX:[id number]  Admit date - 08/18/2014   Admitting Physician Edsel Petrin, DO  Outpatient Primary MD for the patient is Laurena Slimmer, MD  LOS -    Chief Complaint  Patient presents with  . Ankle Pain      HPI on 08/18/2014 Lisa [name] is a 74 y.o. female with a history of hypertension, diabetes, coronary artery disease that presents to the emergency department after falling. Patient states she was attempting to get into her friend's car at which point her ankle rolled and she fell. She denies feeling dizzy, shortness of breath, chest pain prior to the episode. Patient does state that she has frequent pains due to arthritis. X-ray done the ER the right ankle does show fracture. Orthopedics, Dr. Montez Morita was called. [facility] was asked to admit.  Assessment & Plan   Right ankle fracture -Patient's orthopedist, Dr. Montez Morita made aware of patient's admission -X-ray of the right ankle shows fracture of the medial malleolus and distal fibular diaphysis, subtle fracture posterior tibial malleolus -Continue pain control -Patient currently has short leg splint in place -Patient does live alone and has a aide that assists her 7 days a week for 2-3 hours per day. -Spoke with Dr. Montez Morita, orthopedist, who felt that after reviewing the x-rays, patient may need intervention and would like to speak to Dr. Ophelia Charter. Pending call back. -PT consulted and recommended nursing home  Essential Hypertension -Continue amlodipine, metoprolol -Will hold valsartan and Lasix  Diabetes mellitus, type II, diet controlled -Continue insulin sliding scale, CBG monitoring -Hemoglobin A1c pending  GERD -Continue PPI  Morbid obesity -Should be discussed with her primary care physician  History of coronary artery disease -Patient denies  any chest pain at this time -Continue home medications, aspirin, beta blocker, statin  Obstructive sleep apnea -Continue sleep apnea daily at bedtime  Chronic diastolic heart failure -Echocardiogram in April 2015 shows an EF of 50-55%, normal systolic function -Will monitor daily weights, intake and output -Patient appears compensated and euvolemic at this time -Will hold Lasix  Hyperkalemia -Will give dose of Kayexalate, and continue to monitor BMP  CKD, stage III -Creatinine bump 1.34, however GFR remained stable -Will place patient on gentle IV fluids and continue to monitor BMP  Code Status: Full  Family Communication: None at bedside  Disposition Plan: Admitted, pending orthopedic evaluation  Time Spent in minutes   30 minutes  Procedures  None  Consults   Orthopedics  DVT Prophylaxis  Lovenox  Lab Results  Component Value Date   PLT 162 08/19/2014    Medications  Scheduled Meds: . allopurinol  100 mg Oral Daily  . amLODipine  10 mg Oral Daily   And  . irbesartan  300 mg Oral Daily  . aspirin EC  325 mg Oral Daily  . buPROPion  150 mg Oral Daily  . celecoxib  200 mg Oral BID  . cilostazol  100 mg Oral BID  . clotrimazole   Topical  BID  . enoxaparin (LOVENOX) injection  60 mg Subcutaneous Q24H  . fluticasone  1-2 puff Inhalation BID  . furosemide  20 mg Oral QODAY  . gabapentin  300 mg Oral TID  . imipramine  50 mg Oral QHS  . insulin aspart  0-9 Units Subcutaneous TID WC  . metoprolol succinate  25 mg Oral Daily  . nystatin-triamcinolone  1 application Topical BID  . pantoprazole  40 mg Oral Daily  . predniSONE  5 mg Oral BID WC  . simvastatin  10 mg Oral QHS  . sodium chloride  3 mL Intravenous Q12H  . sodium polystyrene  15 g Oral Once  . [START ON 08/20/2014] Vitamin D (Ergocalciferol)  50,000 Units Oral Once per day on Tue Sat   Continuous Infusions: . sodium chloride 75 mL/hr at 08/19/14 1126   PRN Meds:.sodium chloride, acetaminophen  **OR** acetaminophen, HYDROcodone-acetaminophen, ipratropium-albuterol, morphine injection, sodium chloride, traZODone  Antibiotics    Anti-infectives    None        Subjective:   Lisa [name] seen and examined today. Patient states she is doing fine this morning. She denies any chest pain, shortness of breath, abdominal pain. Objective:   Filed Vitals:   08/18/14 1910 08/19/14 0502 08/19/14 0510 08/19/14 1115  BP: 169/99  98/50 106/52  Pulse: 65  67 62  Temp: 97.9 F (36.6 C)  98.1 F (36.7 C)   TempSrc:   Oral   Resp: 18  16   Height:      Weight:  112.447 kg (247 lb 14.4 oz)    SpO2:   96%     Wt Readings from Last 3 Encounters:  08/19/14 112.447 kg (247 lb 14.4 oz)  03/22/14 110.224 kg (243 lb)  09/30/13 113.853 kg (251 lb)     Intake/Output Summary (Last 24 hours) at 08/19/14 1320 Last data filed at 08/18/14 1831  Gross per 24 hour  Intake      0 ml  Output    350 ml  Net   -350 ml    Exam  General: Well developed, well nourished, NAD, appears stated age  HEENT: NCAT, mucous membranes moist.   Cardiovascular: S1 S2 auscultated, no rubs, murmurs or gallops. Regular rate and rhythm.  Respiratory: Clear to auscultation bilaterally with equal chest rise  Abdomen: Soft, obese, nontender, nondistended, + bowel sounds  Extremities: warm dry without cyanosis clubbing or edema  Neuro: AAOx3,nonfocal.  Psych: Appropriate mood and affect    Data Review   Micro Results No results found for this or any previous visit (from the past 240 hour(s)).  Radiology Reports Dg Ankle Complete Right  08/18/2014   CLINICAL DATA:  Pain and swelling after fall  EXAM: RIGHT ANKLE - COMPLETE 3+ VIEW  COMPARISON:  None.  FINDINGS: Frontal, oblique, and lateral views were obtained. There is a fracture of the distal fibular diaphysis with posterior and lateral displacement of the distal fracture fragment with respect proximal fragment. There is an obliquely oriented  fracture of the medial malleolus which is mildly displaced distally. There is a subtle fracture of the posterior tibial malleolus in essentially anatomic alignment. The ankle mortise appears grossly intact. There are spurs arising from the calcaneus. There are foci of vascular calcification.  IMPRESSION: Fracture of the medial malleolus and distal fibular diaphysis. Subtle fracture posterior tibial malleolus. Ankle mortise grossly intact. Extensive arterial vascular calcification. Calcaneal spurs present.   Electronically Signed   By: Bretta Bang III M.D.   On:  08/18/2014 14:12   Dg Knee Complete 4 Views Right  08/18/2014   CLINICAL DATA:  Pain and swelling after fall  EXAM: RIGHT KNEE - COMPLETE 4+ VIEW  COMPARISON:  May 13, 2013  FINDINGS: Frontal, lateral, and bilateral oblique views were obtained. 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 There is evidence of an old fracture along the superior aspect of the patella with fragmentation in this area. The appearance in this area is stable. It should be noted that subtle re-injury in this area could be easily obscured given the prior injury. No acute fracture is seen. No dislocation. There is a joint effusion currently.  There is extensive arterial vascular calcification.  IMPRESSION: Moderate joint effusion. Evidence of old trauma to the superior patella compared to the previous study. Re-injury in this area cannot be excluded, however. No other evidence of fracture. No dislocation.   Electronically Signed   By: Bretta BangWilliam  Woodruff III M.D.   On: 08/18/2014 14:16    CBC  Recent Labs Lab 08/18/14 1843 08/19/14 0632  WBC 7.8 6.3  HGB 10.5* 10.5*  HCT 31.8* 32.1*  PLT 160 162  MCV 94.4 96.7  MCH 31.2 31.6  MCHC 33.0 32.7  RDW 13.4 13.5    Chemistries   Recent Labs Lab 08/18/14 1843 08/19/14 0632 08/19/14 1210  NA 138 138  --   K 4.4 5.2* 5.4*  CL 106 107  --   CO2 26 25   --   GLUCOSE 88 171*  --   BUN 11 15  --   CREATININE 0.86 1.34*  --   CALCIUM 8.9 8.5  --   AST 23  --   --   ALT 15  --   --   ALKPHOS 57  --   --   BILITOT 0.8  --   --    ------------------------------------------------------------------------------------------------------------------ estimated creatinine clearance is 43.8 mL/min (by C-G formula based on Cr of 1.34). ------------------------------------------------------------------------------------------------------------------ No results for input(s): HGBA1C in the last 72 hours. ------------------------------------------------------------------------------------------------------------------ No results for input(s): CHOL, HDL, LDLCALC, TRIG, CHOLHDL, LDLDIRECT in the last 72 hours. ------------------------------------------------------------------------------------------------------------------ No results for input(s): TSH, T4TOTAL, T3FREE, THYROIDAB in the last 72 hours.  Invalid input(s): FREET3 ------------------------------------------------------------------------------------------------------------------ No results for input(s): VITAMINB12, FOLATE, FERRITIN, TIBC, IRON, RETICCTPCT in the last 72 hours.  Coagulation profile No results for input(s): INR, PROTIME in the last 168 hours.  No results for input(s): DDIMER in the last 72 hours.  Cardiac Enzymes No results for input(s): CKMB, TROPONINI, MYOGLOBIN in the last 168 hours.  Invalid input(s): CK ------------------------------------------------------------------------------------------------------------------ Invalid input(s): POCBNP    Caran Storck D.O. on 08/19/2014 at 1:20 PM  Between 7am to 7pm - Pager - [phone]  After 7pm go to [email or web address] - password [email or web address]  And look for the night coverage person covering for me after hours  [facility] Office  [phone]

## 2014-08-19 NOTE — Evaluation (Signed)
Physical Therapy Evaluation Patient Details Name: Lisa [name] MRN: [id number] DOB: 02/06/1941 Today's Date: 08/19/2014   History of Present Illness   74 y.o. Female with PMH of HTN, DM, CAD that was adm to ER after fall at home. X-ray revealed Rt ankle fx. Awaiting ortho MD consult; pt in short leg splint.   Clinical Impression  Pt adm due to above. Pt presents with significant decr in independence with functional mobility secondary to deficits indicated below (see PT problem list below). Pt lives alone and has 5 STE home. Pt does have aide for 3 hours daily. Pt requiring 2 person (A) for transfers at this time and has difficulty maintaining NWB status. Pt will need 24/7 (A) and SNF at this time upon acute D/C. If pt does not qualify for SNF, pt will need ambulance transport, wheelchair, 3 in 1 drop arm commode and hospital bed. Pt to benefit form skilled acute PT to address deficits and maximize functional mobility prior to next venue.     Follow Up Recommendations SNF;Supervision/Assistance - 24 hour    Equipment Recommendations  Hospital bed;Wheelchair (measurements PT);Wheelchair cushion (measurements PT)    Recommendations for Other Services OT consult     Precautions / Restrictions Precautions Precautions: Fall Precaution Comments: pt reports multiple falls at home Restrictions Weight Bearing Restrictions: Yes RLE Weight Bearing: Non weight bearing      Mobility  Bed Mobility Overal bed mobility: Needs Assistance Bed Mobility: Supine to Sit     Supine to sit: Supervision;HOB elevated     General bed mobility comments: cues for technique; incr time and use of handrail  Transfers Overall transfer level: Needs assistance Equipment used: Rolling walker (2 wheeled);2 person hand held assist Transfers: Sit to/from [employer] Sit to Stand: Mod assist;From elevated surface Stand pivot transfers: +2 physical assistance;Mod assist       General  transfer comment: pt able to come up to stand with mod (A) but with heavy posterior lean and difficulty maintaining NWB status; requird 2 person handheld (A) to pivot to chair   Ambulation/Gait             General Gait Details: SPT only at this time  Stairs            Wheelchair Mobility    Modified Rankin (Stroke Patients Only)       Balance Overall balance assessment: History of Falls;Needs assistance Sitting-balance support: Feet supported;No upper extremity supported Sitting balance-Leahy Scale: Good Sitting balance - Comments: sat EOB ~ 5 min; no c/o dizziness Postural control: Posterior lean Standing balance support: During functional activity;Bilateral upper extremity supported Standing balance-Leahy Scale: Zero Standing balance comment: (A) and RW to maitnain balance; pt with LOB posteriorly                              Pertinent Vitals/Pain Pain Assessment: 0-10 Pain Score: 8  Pain Location: Rt ankle Pain Descriptors / Indicators: Throbbing Pain Intervention(s): Monitored during session;Premedicated before session;Repositioned    Home Living Family/patient expects to be discharged to:: Skilled nursing facility Living Arrangements: Alone Available Help at Discharge: Family;Available PRN/intermittently;Personal care attendant Type of Home: Apartment Home Access: Stairs to enter Entrance Stairs-Rails: Left;Right;Can reach both Entrance Stairs-Number of Steps: 5 Home Layout: One level Home Equipment: Walker - 4 wheels;Cane - single point;Shower seat;Bedside commode Additional Comments: Pt lives alone; reports she has aide 3 hours a day but would not have 24/7 (A)  Prior Function Level of Independence: Needs assistance   Gait / Transfers Assistance Needed: ambulated with RW for incr distance and cane for short distances   ADL's / Homemaking Assistance Needed: home aide 3 hrs / 7 days         Hand Dominance        Extremity/Trunk  Assessment   Upper Extremity Assessment: Defer to OT evaluation           Lower Extremity Assessment: RLE deficits/detail RLE Deficits / Details: hip and quad grossly 3/5    Cervical / Trunk Assessment: Normal  Communication   Communication: No difficulties  Cognition Arousal/Alertness: Awake/alert Behavior During Therapy: WFL for tasks assessed/performed Overall Cognitive Status: Within Functional Limits for tasks assessed                      General Comments      Exercises General Exercises - Lower Extremity Straight Leg Raises: AROM;Right;5 reps;Seated Low Level/ICU Exercises Quad Sets: AROM;Right;5 reps;Seated      Assessment/Plan    PT Assessment Patient needs continued PT services  PT Diagnosis Difficulty walking;Generalized weakness;Acute pain   PT Problem List Decreased strength;Decreased range of motion;Decreased activity tolerance;Decreased balance;Decreased mobility;Decreased knowledge of precautions;Decreased knowledge of use of DME;Pain  PT Treatment Interventions DME instruction;Gait training;Stair training;Functional mobility training;Therapeutic exercise;Balance training;Therapeutic activities;Neuromuscular re-education;Patient/family education;Wheelchair mobility training   PT Goals (Current goals can be found in the Care Plan section) Acute Rehab PT Goals Patient Stated Goal: to get better then go home PT Goal Formulation: With patient Time For Goal Achievement: 08/26/14 Potential to Achieve Goals: Fair    Frequency Min 4X/week (set in middle ; due to may not qualify for SNF)   Barriers to discharge Decreased caregiver support;Inaccessible home environment lives alone and has 5 STE    Co-evaluation               End of Session Equipment Utilized During Treatment: Gait belt Activity Tolerance: Patient limited by pain;Patient limited by fatigue Patient left: in chair;with call bell/phone within reach Nurse Communication: Mobility  status;Precautions;Weight bearing status    Functional Assessment Tool Used: clinical judgement Functional Limitation: Mobility: Walking and moving around Mobility: Walking and Moving Around Current Status ([id number]): At least 60 percent but less than 80 percent impaired, limited or restricted Mobility: Walking and Moving Around Goal Status [phone]): At least 1 percent but less than 20 percent impaired, limited or restricted    Time: 0909-0933 PT Time Calculation (min) (ACUTE ONLY): 24 min   Charges:   PT Evaluation $Initial PT Evaluation Tier I: 1 Procedure PT Treatments $Therapeutic Activity: 8-22 mins   PT G Codes:   PT G-Codes **NOT FOR INPATIENT CLASS** Functional Assessment Tool Used: clinical judgement Functional Limitation: Mobility: Walking and moving around Mobility: Walking and Moving Around Current Status ([id number]([id number]): At least 60 percent but less than 80 percent impaired, limited or restricted Mobility: Walking and Moving Around Goal Status [phone]([id number]): At least 1 percent but less than 20 percent impaired, limited or restricted    Donell SievertWest, Amelya Mabry N, [place]PT  [phone] 08/19/2014, 10:11 AM

## 2014-08-19 NOTE — Progress Notes (Signed)
UR completed 

## 2014-08-19 NOTE — Progress Notes (Signed)
Patient arrived to floor from ED @ 1850. No CBGs was noted from ED. Patient was in ED since 1300 and no CBGs was done. CBG was obtained @ 2000 and found to be 66. Patient was given crackers and juice and CBG repeated @ 2200 -125. Will continue to monitor

## 2014-08-19 NOTE — Brief Op Note (Signed)
08/18/2014 - 08/19/2014  11:42 PM  PATIENT:  Lisa [name]  74 y.o. female  PRE-OPERATIVE DIAGNOSIS:  ankle fx  POST-OPERATIVE DIAGNOSIS:  Right Ankle Fracture  PROCEDURE:  Procedure(s): OPEN REDUCTION INTERNAL FIXATION (ORIF) ANKLE FRACTURE  SURGEON:  Surgeon(s): Cammy CopaGregory Scott Yula Crotwell, MD  ASSISTANT: none  ANESTHESIA:   general  EBL: 50 ml    Total I/O In: 1000 [I.V.:1000] Out: -   BLOOD ADMINISTERED: none  DRAINS: none   LOCAL MEDICATIONS USED:  none  SPECIMEN:  No Specimen  COUNTS:  YES  TOURNIQUET:    DICTATION: .Other Dictation: Dictation Number [id number]  PLAN OF CARE: Admit to inpatient   PATIENT DISPOSITION:  PACU - hemodynamically stable

## 2014-08-19 NOTE — Transfer of Care (Signed)
Immediate Anesthesia Transfer of Care Note  Patient: Lisa [name]  Procedure(s) Performed: Procedure(s): OPEN REDUCTION INTERNAL FIXATION (ORIF) ANKLE FRACTURE (Right)  Patient Location: PACU  Anesthesia Type:General  Level of Consciousness: awake, oriented, patient cooperative and responds to stimulation  Airway & Oxygen Therapy: Patient Spontanous Breathing and Patient connected to nasal cannula oxygen  Post-op Assessment: Report given to RN, Post -op Vital signs reviewed and stable and Patient moving all extremities X 4  Post vital signs: Reviewed and stable  Last Vitals:  Filed Vitals:   08/19/14 1933  BP: 107/49  Pulse: 74  Temp: 36.7 C  Resp:     Complications: No apparent anesthesia complications

## 2014-08-19 NOTE — Progress Notes (Signed)
Pt is not in room at this time. She is still in the OR. RT came twice to check on the Pt. CPAP is in the room ready for use when Pt comes back. RN will call the RT when ready.

## 2014-08-19 NOTE — Consult Note (Signed)
Reason for Consult: Right ankle pain Referring Physician: [name] is an 74 y.o. female.  HPI: Lisa [name] is a 74 year old ambulatory patient who fell yesterday she sustained right ankle pain there was no loss of consciousness she had inability to weight-bear after the accident  Past Medical History  Diagnosis Date  . Diabetes mellitus without complication   . Hypertension   . CHF (congestive heart failure)   . RBBB   . Dyslipidemia   . Morbid obesity   . Diastolic dysfunction   . Coronary artery disease     s/p CABG  . Hyperlipidemia   . OSA (obstructive sleep apnea)     mild OSA with AHI 7.15 now on CPAP at 16cm H2O    Past Surgical History  Procedure Laterality Date  . Stents      cardiac  . Cardiac surgery    . Knee surgery      bilateral  . Back surgery    . Rotator cuff surgery    . Coronary artery bypass graft    . Cardiac catheterization      Family History  Problem Relation Age of Onset  . Heart attack Mother   . Heart disease Mother   . Stomach cancer Father     Social History:  reports that she has quit smoking. She does not have any smokeless tobacco history on file. She reports that she does not drink alcohol or use illicit drugs.  Allergies: No Known Allergies  Medications: I have reviewed the patient's current medications.  Results for orders placed or performed during the hospital encounter of 08/18/14 (from the past 48 hour(s))  CBC     Status: Abnormal   Collection Time: 08/18/14  6:43 PM  Result Value Ref Range   WBC 7.8 4.0 - 10.5 K/uL   RBC 3.37 (L) 3.87 - 5.11 MIL/uL   Hemoglobin 10.5 (L) 12.0 - 15.0 g/dL   HCT 31.8 (L) 36.0 - 46.0 %   MCV 94.4 78.0 - 100.0 fL   MCH 31.2 26.0 - 34.0 pg   MCHC 33.0 30.0 - 36.0 g/dL   RDW 13.4 11.5 - 15.5 %   Platelets 160 150 - 400 K/uL  Comprehensive metabolic panel     Status: Abnormal   Collection Time: 08/18/14  6:43 PM  Result Value Ref Range   Sodium 138 135 - 145 mmol/L    Potassium 4.4 3.5 - 5.1 mmol/L   Chloride 106 96 - 112 mmol/L   CO2 26 19 - 32 mmol/L   Glucose, Bld 88 70 - 99 mg/dL   BUN 11 6 - 23 mg/dL   Creatinine, Ser 0.86 0.50 - 1.10 mg/dL   Calcium 8.9 8.4 - 10.5 mg/dL   Total Protein 6.1 6.0 - 8.3 g/dL   Albumin 2.9 (L) 3.5 - 5.2 g/dL   AST 23 0 - 37 U/L   ALT 15 0 - 35 U/L   Alkaline Phosphatase 57 39 - 117 U/L   Total Bilirubin 0.8 0.3 - 1.2 mg/dL   GFR calc non Af Amer 64 (L) >90 mL/min   GFR calc Af Amer 75 (L) >90 mL/min    Comment: (NOTE) The eGFR has been calculated using the CKD EPI equation. This calculation has not been validated in all clinical situations. eGFR's persistently <90 mL/min signify possible Chronic Kidney Disease.    Anion gap 6 5 - 15  Glucose, capillary     Status: Abnormal   Collection Time:  08/18/14  8:04 PM  Result Value Ref Range   Glucose-Capillary 66 (L) 70 - 99 mg/dL  Glucose, capillary     Status: Abnormal   Collection Time: 08/18/14 10:05 PM  Result Value Ref Range   Glucose-Capillary 125 (H) 70 - 99 mg/dL  Glucose, capillary     Status: Abnormal   Collection Time: 08/19/14  6:24 AM  Result Value Ref Range   Glucose-Capillary 169 (H) 70 - 99 mg/dL  Basic metabolic panel     Status: Abnormal   Collection Time: 08/19/14  6:32 AM  Result Value Ref Range   Sodium 138 135 - 145 mmol/L   Potassium 5.2 (H) 3.5 - 5.1 mmol/L   Chloride 107 96 - 112 mmol/L   CO2 25 19 - 32 mmol/L   Glucose, Bld 171 (H) 70 - 99 mg/dL   BUN 15 6 - 23 mg/dL   Creatinine, Ser 1.34 (H) 0.50 - 1.10 mg/dL    Comment: DELTA CHECK NOTED   Calcium 8.5 8.4 - 10.5 mg/dL   GFR calc non Af Amer 38 (L) >90 mL/min   GFR calc Af Amer 44 (L) >90 mL/min    Comment: (NOTE) The eGFR has been calculated using the CKD EPI equation. This calculation has not been validated in all clinical situations. eGFR's persistently <90 mL/min signify possible Chronic Kidney Disease.    Anion gap 6 5 - 15  CBC     Status: Abnormal   Collection  Time: 08/19/14  6:32 AM  Result Value Ref Range   WBC 6.3 4.0 - 10.5 K/uL   RBC 3.32 (L) 3.87 - 5.11 MIL/uL   Hemoglobin 10.5 (L) 12.0 - 15.0 g/dL   HCT 32.1 (L) 36.0 - 46.0 %   MCV 96.7 78.0 - 100.0 fL   MCH 31.6 26.0 - 34.0 pg   MCHC 32.7 30.0 - 36.0 g/dL   RDW 13.5 11.5 - 15.5 %   Platelets 162 150 - 400 K/uL  Glucose, capillary     Status: Abnormal   Collection Time: 08/19/14 12:06 PM  Result Value Ref Range   Glucose-Capillary 154 (H) 70 - 99 mg/dL   Comment 1 Notify RN    Comment 2 Documented in Char   Potassium     Status: Abnormal   Collection Time: 08/19/14 12:10 PM  Result Value Ref Range   Potassium 5.4 (H) 3.5 - 5.1 mmol/L    Dg Ankle Complete Right  08/18/2014   CLINICAL DATA:  Pain and swelling after fall  EXAM: RIGHT ANKLE - COMPLETE 3+ VIEW  COMPARISON:  None.  FINDINGS: Frontal, oblique, and lateral views were obtained. There is a fracture of the distal fibular diaphysis with posterior and lateral displacement of the distal fracture fragment with respect proximal fragment. There is an obliquely oriented fracture of the medial malleolus which is mildly displaced distally. There is a subtle fracture of the posterior tibial malleolus in essentially anatomic alignment. The ankle mortise appears grossly intact. There are spurs arising from the calcaneus. There are foci of vascular calcification.  IMPRESSION: Fracture of the medial malleolus and distal fibular diaphysis. Subtle fracture posterior tibial malleolus. Ankle mortise grossly intact. Extensive arterial vascular calcification. Calcaneal spurs present.   Electronically Signed   By: Lowella Grip III M.D.   On: 08/18/2014 14:12   Dg Knee Complete 4 Views Right  08/18/2014   CLINICAL DATA:  Pain and swelling after fall  EXAM: RIGHT KNEE - COMPLETE 4+ VIEW  COMPARISON:  May 13, 2013  FINDINGS: Frontal, lateral, and bilateral oblique views were obtained. There is a total knee replacement with femoral and tibial  prosthetic components appearing well-seated. There is evidence of an old fracture along the superior aspect of the patella with fragmentation in this area. The appearance in this area is stable. It should be noted that subtle re-injury in this area could be easily obscured given the prior injury. No acute fracture is seen. No dislocation. There is a joint effusion currently.  There is extensive arterial vascular calcification.  IMPRESSION: Moderate joint effusion. Evidence of old trauma to the superior patella compared to the previous study. Re-injury in this area cannot be excluded, however. No other evidence of fracture. No dislocation.   Electronically Signed   By: Lowella Grip III M.D.   On: 08/18/2014 14:16    Review of Systems  Constitutional: Negative.   HENT: Negative.   Eyes: Negative.   Respiratory: Negative.   Cardiovascular: Negative.   Gastrointestinal: Negative.   Genitourinary: Negative.   Musculoskeletal: Positive for joint pain.  Skin: Negative.   Neurological: Negative.   Endo/Heme/Allergies: Negative.   Psychiatric/Behavioral: Negative.    Blood pressure 106/52, pulse 62, temperature 98.1 F (36.7 C), temperature source Oral, resp. rate 16, height _0  (1.6 m), weight 112.447 kg (247 lb 14.4 oz), SpO2 96 %. Physical Exam  Constitutional: She appears well-developed.  HENT:  Head: Normocephalic.  Eyes: Pupils are equal, round, and reactive to light.  Neck: Normal range of motion.  Cardiovascular: Normal rate.   Respiratory: Effort normal.  Neurological: She is alert.  Skin: Skin is warm.  Psychiatric: She has a normal mood and affect.   semination of the right ankle demonstrates soft compartments the ankle is in a splint there is diminished sensation dorsally and plantarly about the right foot in the left foot. Patient does have palpable but somewhat diminished pulses on the left foot the right foot is difficult to assess but the toes are perfused  easily.  Assessment/Plan: Radiographs reviewed she has a bimalleolar ankle fracture with some displacement of the medial malleolus and some shortening of the lateral malleolus impression bilateral ankle fracture plan to talk with Shirlee Limerick today about operative and nonoperative management of this particular problem. Nonoperative management of this unstable ankle fracture could lead to eventual the need for surgery. Should also have to be off it for fairly long period of time conversely with fixation range of motion and weightbearing could be started earlier however carries inherent risk of infection which is discussed with her at length. Patient would like to proceed with open reduction internal fixation which I think is likely her best course. Risk and benefits are discussed including infection nonunion malunion need for more surgery. All questions answered. Patient currently is eating but advised her to be nothing by mouth after 1:30.  DEAN,GREGORY SCOTT 08/19/2014, 1:16 PM

## 2014-08-19 NOTE — Anesthesia Preprocedure Evaluation (Signed)
Anesthesia Evaluation  Patient identified by MRN, date of birth, ID band Patient awake    Reviewed: Allergy & Precautions, NPO status , Patient's Chart, lab work & pertinent test results  Airway Mallampati: II   Neck ROM: full    Dental   Pulmonary shortness of breath, asthma , sleep apnea , former smoker,          Cardiovascular hypertension, + CAD, + CABG and +CHF + dysrhythmias     Neuro/Psych    GI/Hepatic   Endo/Other  diabetes, Type obesity  Renal/GU      Musculoskeletal   Abdominal   Peds  Hematology   Anesthesia Other Findings   Reproductive/Obstetrics                             Anesthesia Physical Anesthesia Plan  ASA: III  Anesthesia Plan: General and Regional   Post-op Pain Management: MAC Combined w/ Regional for Post-op pain   Induction: Intravenous  Airway Management Planned: Oral ETT  Additional Equipment:   Intra-op Plan:   Post-operative Plan: Extubation in OR  Informed Consent: I have reviewed the patients History and Physical, chart, labs and discussed the procedure including the risks, benefits and alternatives for the proposed anesthesia with the patient or authorized representative who has indicated his/her understanding and acceptance.     Plan Discussed with: CRNA, Anesthesiologist and Surgeon  Anesthesia Plan Comments:         Anesthesia Quick Evaluation

## 2014-08-20 ENCOUNTER — Inpatient Hospital Stay (HOSPITAL_COMMUNITY): Payer: Medicare Other

## 2014-08-20 LAB — URINALYSIS, ROUTINE W REFLEX MICROSCOPIC
Glucose, UA: NEGATIVE mg/dL
HGB URINE DIPSTICK: NEGATIVE
Ketones, ur: 15 mg/dL — AB
Nitrite: NEGATIVE
Protein, ur: 100 mg/dL — AB
SPECIFIC GRAVITY, URINE: 1.02 (ref 1.005–1.030)
UROBILINOGEN UA: 1 mg/dL (ref 0.0–1.0)
pH: 7 (ref 5.0–8.0)

## 2014-08-20 LAB — HEMOGLOBIN A1C
Hgb A1c MFr Bld: 5.9 % — ABNORMAL HIGH (ref 4.8–5.6)
Mean Plasma Glucose: 123 mg/dL

## 2014-08-20 LAB — GLUCOSE, CAPILLARY
GLUCOSE-CAPILLARY: 81 mg/dL (ref 70–99)
Glucose-Capillary: 159 mg/dL — ABNORMAL HIGH (ref 70–99)
Glucose-Capillary: 150 mg/dL — ABNORMAL HIGH (ref 70–99)
Glucose-Capillary: 101 mg/dL — ABNORMAL HIGH (ref 70–99)

## 2014-08-20 LAB — CBC
HEMATOCRIT: 33.1 % — AB (ref 36.0–46.0)
Hemoglobin: 10.6 g/dL — ABNORMAL LOW (ref 12.0–15.0)
MCH: 31 pg (ref 26.0–34.0)
MCHC: 32 g/dL (ref 30.0–36.0)
MCV: 96.8 fL (ref 78.0–100.0)
PLATELETS: 145 10*3/uL — AB (ref 150–400)
RBC: 3.42 MIL/uL — ABNORMAL LOW (ref 3.87–5.11)
RDW: 13.5 % (ref 11.5–15.5)
WBC: 9.1 10*3/uL (ref 4.0–10.5)

## 2014-08-20 LAB — BASIC METABOLIC PANEL
Anion gap: 11 (ref 5–15)
Anion gap: 10 (ref 5–15)
BUN: 25 mg/dL — ABNORMAL HIGH (ref 6–23)
BUN: 29 mg/dL — ABNORMAL HIGH (ref 6–23)
CALCIUM: 8.2 mg/dL — AB (ref 8.4–10.5)
CHLORIDE: 103 mmol/L (ref 96–112)
CO2: 22 mmol/L (ref 19–32)
CO2: 21 mmol/L (ref 19–32)
CREATININE: 2.68 mg/dL — AB (ref 0.50–1.10)
Calcium: 8.1 mg/dL — ABNORMAL LOW (ref 8.4–10.5)
Chloride: 104 mmol/L (ref 96–112)
Creatinine, Ser: 2.62 mg/dL — ABNORMAL HIGH (ref 0.50–1.10)
GFR calc Af Amer: 19 mL/min — ABNORMAL LOW (ref >90)
GFR calc non Af Amer: 16 mL/min — ABNORMAL LOW (ref >90)
GFR calc non Af Amer: 17 mL/min — ABNORMAL LOW (ref >90)
GFR, EST AFRICAN AMERICAN: 19 mL/min — AB (ref >90)
Glucose, Bld: 190 mg/dL — ABNORMAL HIGH (ref 70–99)
Glucose, Bld: 80 mg/dL (ref 70–99)
POTASSIUM: 5 mmol/L (ref 3.5–5.1)
Potassium: 5.6 mmol/L — ABNORMAL HIGH (ref 3.5–5.1)
SODIUM: 136 mmol/L (ref 135–145)
Sodium: 135 mmol/L (ref 135–145)

## 2014-08-20 LAB — PROTIME-INR
INR: 1.21 (ref 0.00–1.49)
Prothrombin Time: 15.5 seconds — ABNORMAL HIGH (ref 11.6–15.2)

## 2014-08-20 LAB — URINE MICROSCOPIC-ADD ON

## 2014-08-20 MED ORDER — HYDROMORPHONE HCL 1 MG/ML IJ SOLN
INTRAMUSCULAR | Status: AC
  Filled 2014-08-20: qty 1

## 2014-08-20 MED ORDER — WARFARIN VIDEO
Freq: Once | Status: AC
  Administered 2014-08-20: 19:00:00

## 2014-08-20 MED ORDER — ONDANSETRON HCL 4 MG PO TABS
4.0000 mg | ORAL_TABLET | Freq: Four times a day (QID) | ORAL | Status: DC | PRN
Start: 2014-08-20 — End: 2014-08-23

## 2014-08-20 MED ORDER — METOCLOPRAMIDE HCL 10 MG PO TABS
5.0000 mg | ORAL_TABLET | Freq: Three times a day (TID) | ORAL | Status: DC | PRN
Start: 2014-08-20 — End: 2014-08-23

## 2014-08-20 MED ORDER — DEXTROSE 5 % IV SOLN
500.0000 mg | Freq: Four times a day (QID) | INTRAVENOUS | Status: DC | PRN
  Filled 2014-08-20: qty 5

## 2014-08-20 MED ORDER — DEXTROSE 50 % IV SOLN
1.0000 | Freq: Once | INTRAVENOUS | Status: AC
  Administered 2014-08-20: 50 mL via INTRAVENOUS
  Filled 2014-08-20: qty 50

## 2014-08-20 MED ORDER — MORPHINE SULFATE 2 MG/ML IJ SOLN: 1.0000 mg | INTRAMUSCULAR | Status: DC | PRN

## 2014-08-20 MED ORDER — ASPIRIN EC 325 MG PO TBEC
325.0000 mg | DELAYED_RELEASE_TABLET | Freq: Every day | ORAL | Status: DC
Start: 2014-08-20 — End: 2014-08-23
  Administered 2014-08-20 – 2014-08-23 (×4): 325 mg via ORAL
  Filled 2014-08-20 (×4): qty 1

## 2014-08-20 MED ORDER — COUMADIN BOOK
Freq: Once | Status: AC
  Administered 2014-08-20: 09:00:00
  Filled 2014-08-20: qty 1

## 2014-08-20 MED ORDER — WARFARIN SODIUM 5 MG PO TABS
5.0000 mg | ORAL_TABLET | Freq: Once | ORAL | Status: AC
  Administered 2014-08-20: 5 mg via ORAL
  Filled 2014-08-20: qty 1

## 2014-08-20 MED ORDER — METOCLOPRAMIDE HCL 5 MG/ML IJ SOLN: 5.0000 mg | Freq: Three times a day (TID) | INTRAMUSCULAR | Status: DC | PRN

## 2014-08-20 MED ORDER — WARFARIN - PHARMACIST DOSING INPATIENT: Freq: Every day | Status: DC

## 2014-08-20 MED ORDER — ONDANSETRON HCL 4 MG/2ML IJ SOLN: 4.0000 mg | Freq: Four times a day (QID) | INTRAMUSCULAR | Status: DC | PRN

## 2014-08-20 MED ORDER — INSULIN ASPART 100 UNIT/ML ~~LOC~~ SOLN
10.0000 [IU] | Freq: Once | SUBCUTANEOUS | Status: AC
Start: 2014-08-20 — End: 2014-08-20
  Administered 2014-08-20: 10 [IU] via SUBCUTANEOUS

## 2014-08-20 MED ORDER — SODIUM CHLORIDE 0.9 % IV SOLN
1.0000 g | Freq: Once | INTRAVENOUS | Status: AC
  Administered 2014-08-20: 1 g via INTRAVENOUS
  Filled 2014-08-20: qty 10

## 2014-08-20 MED ORDER — CEFAZOLIN SODIUM-DEXTROSE 2-3 GM-% IV SOLR
2.0000 g | Freq: Three times a day (TID) | INTRAVENOUS | Status: AC
  Administered 2014-08-20 (×2): 2 g via INTRAVENOUS
  Filled 2014-08-20 (×3): qty 50

## 2014-08-20 MED ORDER — METHOCARBAMOL 500 MG PO TABS
500.0000 mg | ORAL_TABLET | Freq: Four times a day (QID) | ORAL | Status: DC | PRN
  Administered 2014-08-21 – 2014-08-22 (×2): 500 mg via ORAL
  Filled 2014-08-20 (×2): qty 1

## 2014-08-20 MED ORDER — SODIUM POLYSTYRENE SULFONATE 15 GM/60ML PO SUSP
30.0000 g | Freq: Once | ORAL | Status: AC
  Administered 2014-08-20: 30 g via ORAL
  Filled 2014-08-20: qty 120

## 2014-08-20 NOTE — Progress Notes (Signed)
[facility]                                                                              Patient Demographics  Lisa [name], is a 74 y.o. female, DOB - 06-17-1941, WGN:[id number]  Admit date - 08/18/2014   Admitting Physician Edsel Petrin, DO  Outpatient Primary MD for the patient is Laurena Slimmer, MD  LOS - 1   Chief Complaint  Patient presents with  . Ankle Pain      HPI on 08/18/2014 Lisa [name] is a 74 y.o. female with a history of hypertension, diabetes, coronary artery disease that presents to the emergency department after falling. Patient states she was attempting to get into her friend's car at which point her ankle rolled and she fell. She denies feeling dizzy, shortness of breath, chest pain prior to the episode. Patient does state that she has frequent pains due to arthritis. X-ray done the ER the right ankle does show fracture. Orthopedics, Dr. Montez Morita was called. [facility] was asked to admit.  Assessment & Plan   Right ankle fracture -Patient's orthopedist, Dr. Montez Morita made aware of patient's admission -X-ray of the right ankle shows fracture of the medial malleolus and distal fibular diaphysis, subtle fracture posterior tibial malleolus -Continue pain control -Patient does live alone and has a aide that assists her 7 days a week for 2-3 hours per day. -Spoke with Dr. Montez Morita, orthopedist, who felt that after reviewing the x-rays, patient may need intervention and spoke with Dr. August Saucer -Patient s/p ORIF right ankle 08/19/2014 -PT consulted and recommended nursing home -Will speak to SW regarding placement  Acute on CKD, stage III -Creatinine bump 2.68 despite IVF -Will repeat to make sure not error -Obtain UA, urine electrolytes, renal US -Monitor intake and output -Continue IVF  -Avoid nephrotoxic agents  Hyperkalemia -Will give dose of Kayexalate, insulin/ D50amp, calcium gluconate -Will continue to monitor, likely secondary to ACEI and  AKI  Essential Hypertension -Continue amlodipine, metoprolol -Will hold valsartan and Lasix   Diabetes mellitus, type II, diet controlled -Continue insulin sliding scale, CBG monitoring -Hemoglobin A1c 5.9  GERD -Continue PPI  Morbid obesity -Should be discussed with her primary care physician  History of coronary artery disease -Patient denies any chest pain at this time -Continue home medications, aspirin, beta blocker, statin  Obstructive sleep apnea -Continue sleep apnea daily at bedtime  Chronic diastolic heart failure -Echocardiogram in April 2015 shows an EF of 50-55%, normal systolic function -Will monitor daily weights, intake and output -Patient appears compensated and euvolemic at this time -Will hold Lasix  Code Status: Full  Family Communication: None at bedside  Disposition Plan: Admitted, Treating AKI, pending SNF placement  Time Spent in minutes   30 minutes  Procedures  Right Ankle ORIF Renal US  Consults   Orthopedics  DVT Prophylaxis  Coumadin  Lab Results  Component Value Date   PLT 145* 08/20/2014    Medications  Scheduled Meds: . allopurinol  100 mg Oral Daily  . amLODipine  10 mg Oral Daily  . aspirin EC  325 mg Oral Daily  . buPROPion  150 mg Oral Daily  .  ceFAZolin (ANCEF) IV  2  g Intravenous 3 times per day  . cilostazol  100 mg Oral BID  . clotrimazole   Topical BID  . fluticasone  1-2 puff Inhalation BID  . gabapentin  300 mg Oral TID  . HYDROmorphone      . HYDROmorphone      . imipramine  50 mg Oral QHS  . insulin aspart  0-9 Units Subcutaneous TID WC  . metoprolol succinate  25 mg Oral Daily  . nystatin-triamcinolone  1 application Topical BID  . pantoprazole  40 mg Oral Daily  . predniSONE  5 mg Oral BID WC  . simvastatin  10 mg Oral QHS  . sodium chloride  3 mL Intravenous Q12H  . Vitamin D (Ergocalciferol)  50,000 Units Oral Once per day on Tue Sat  . warfarin  5 mg Oral ONCE-1800  . warfarin   Does not  apply Once  . Warfarin - Pharmacist Dosing Inpatient   Does not apply q1800   Continuous Infusions: . sodium chloride 75 mL/hr at 08/19/14 1126   PRN Meds:.sodium chloride, acetaminophen **OR** acetaminophen, HYDROcodone-acetaminophen, HYDROmorphone (DILAUDID) injection, ipratropium-albuterol, methocarbamol **OR** methocarbamol (ROBAXIN)  IV, metoCLOPramide **OR** metoCLOPramide (REGLAN) injection, morphine injection, morphine injection, ondansetron (ZOFRAN) IV, ondansetron **OR** ondansetron (ZOFRAN) IV, sodium chloride, traZODone  Antibiotics    Anti-infectives    Start     Dose/Rate Route Frequency Ordered Stop   08/20/14 0341  ceFAZolin (ANCEF) IVPB 2 g/50 mL premix     2 g 100 mL/hr over 30 Minutes Intravenous 3 times per day 08/20/14 0341 08/20/14 2159        Subjective:   Lisa [name] seen and examined today. Patient states she feels tired this morning.  She denies abdominal pain, chest pain, SOB.    Objective:   Filed Vitals:   08/20/14 0045 08/20/14 0355 08/20/14 0705 08/20/14 0848  BP: 111/59 121/96 95/57   Pulse: 81 86 86   Temp:  97.7 F (36.5 C) 97.8 F (36.6 C)   TempSrc:  Oral Oral   Resp: 20 18 16    Height:      Weight:      SpO2: 95% 96% 100% 98%    Wt Readings from Last 3 Encounters:  08/19/14 112.447 kg (247 lb 14.4 oz)  03/22/14 110.224 kg (243 lb)  09/30/13 113.853 kg (251 lb)     Intake/Output Summary (Last 24 hours) at 08/20/14 1123 Last data filed at 08/19/14 2336  Gross per 24 hour  Intake   1560 ml  Output      0 ml  Net   1560 ml    Exam  General: Well developed, well nourished, NAD, appears stated age  HEENT: NCAT, mucous membranes moist.   Cardiovascular: S1 S2 auscultated, RRR  Respiratory: Clear to auscultation bilaterally with equal chest rise  Abdomen: Soft, obese, nontender, nondistended, + bowel sounds  Extremities: RLE wrapped.  No edema, cyanosis, clubbing of LLE.  Neuro: AAOx3,nonfocal.  Psych:  Appropriate mood and affect  Data Review   Micro Results Recent Results (from the past 240 hour(s))  Surgical pcr screen     Status: None   Collection Time: 08/19/14  7:19 PM  Result Value Ref Range Status   MRSA, PCR NEGATIVE NEGATIVE Final   Staphylococcus aureus NEGATIVE NEGATIVE Final    Comment:        The Xpert SA Assay (FDA approved for NASAL specimens in patients over 64 years of age), is one component of a comprehensive surveillance program.  Test  performance has been validated by [facility] for patients greater than or equal to 27 year old. It is not intended to diagnose infection nor to guide or monitor treatment.     Radiology Reports Dg Ankle 2 Views Right  08/20/2014   CLINICAL DATA:  74 year old female ORIF right ankle fracture  EXAM: DG C-ARM 61-120 MIN; RIGHT ANKLE - 2 VIEW  TECHNIQUE: Two intraoperative fluoroscopic views  FLUOROSCOPY TIME:  Fluoroscopy Time (in minutes and seconds): Not specified  COMPARISON:  08/18/2013  FINDINGS: ORIF hardware at the distal fibula and medial malleolus. Near anatomic alignment. Adverse features.  IMPRESSION: ORIF right ankle, no adverse features.   Electronically Signed   By: Odessa Fleming M.D.   On: 08/20/2014 00:19   Dg Ankle Complete Right  08/18/2014   CLINICAL DATA:  Pain and swelling after fall  EXAM: RIGHT ANKLE - COMPLETE 3+ VIEW  COMPARISON:  None.  FINDINGS: Frontal, oblique, and lateral views were obtained. There is a fracture of the distal fibular diaphysis with posterior and lateral displacement of the distal fracture fragment with respect proximal fragment. There is an obliquely oriented fracture of the medial malleolus which is mildly displaced distally. There is a subtle fracture of the posterior tibial malleolus in essentially anatomic alignment. The ankle mortise appears grossly intact. There are spurs arising from the calcaneus. There are foci of vascular calcification.  IMPRESSION: Fracture of the medial malleolus and  distal fibular diaphysis. Subtle fracture posterior tibial malleolus. Ankle mortise grossly intact. Extensive arterial vascular calcification. Calcaneal spurs present.   Electronically Signed   By: Bretta Bang III M.D.   On: 08/18/2014 14:12   Dg Knee Complete 4 Views Right  08/18/2014   CLINICAL DATA:  Pain and swelling after fall  EXAM: RIGHT KNEE - COMPLETE 4+ VIEW  COMPARISON:  May 13, 2013  FINDINGS: Frontal, lateral, and bilateral oblique views were obtained. 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 There is evidence of an old fracture along the superior aspect of the patella with fragmentation in this area. The appearance in this area is stable. It should be noted that subtle re-injury in this area could be easily obscured given the prior injury. No acute fracture is seen. No dislocation. There is a joint effusion currently.  There is extensive arterial vascular calcification.  IMPRESSION: Moderate joint effusion. Evidence of old trauma to the superior patella compared to the previous study. Re-injury in this area cannot be excluded, however. No other evidence of fracture. No dislocation.   Electronically Signed   By: Bretta Bang III M.D.   On: 08/18/2014 14:16   Dg Ankle Right Port  08/20/2014   CLINICAL DATA:  74 year old female status post ORIF. Initial encounter.  EXAM: PORTABLE RIGHT ANKLE - 2 VIEW  COMPARISON:  08/19/2014 and earlier  FINDINGS: Portable AP and cross-table lateral views. Splint material degrades bone detail. ORIF distal fibula and medial malleolus. Near anatomic alignment. No adverse features.  IMPRESSION: No adverse features status post right ankle ORIF.   Electronically Signed   By: Odessa Fleming M.D.   On: 08/20/2014 00:34   Dg C-arm 1-60 Min  08/20/2014   CLINICAL DATA:  74 year old female ORIF right ankle fracture  EXAM: DG C-ARM 61-120 MIN; RIGHT ANKLE - 2 VIEW  TECHNIQUE: Two intraoperative  fluoroscopic views  FLUOROSCOPY TIME:  Fluoroscopy Time (in minutes and seconds): Not specified  COMPARISON:  08/18/2013  FINDINGS: ORIF hardware at the distal fibula  and medial malleolus. Near anatomic alignment. Adverse features.  IMPRESSION: ORIF right ankle, no adverse features.   Electronically Signed   By: Odessa FlemingH  Hall M.D.   On: 08/20/2014 00:19    CBC  Recent Labs Lab 08/18/14 1843 08/19/14 0632 08/20/14 0830  WBC 7.8 6.3 9.1  HGB 10.5* 10.5* 10.6*  HCT 31.8* 32.1* 33.1*  PLT 160 162 145*  MCV 94.4 96.7 96.8  MCH 31.2 31.6 31.0  MCHC 33.0 32.7 32.0  RDW 13.4 13.5 13.5    Chemistries   Recent Labs Lab 08/18/14 1843 08/19/14 0632 08/19/14 1210 08/20/14 0830  NA 138 138  --  136  K 4.4 5.2* 5.4* 5.6*  CL 106 107  --  103  CO2 26 25  --  22  GLUCOSE 88 171*  --  190*  BUN 11 15  --  25*  CREATININE 0.86 1.34*  --  2.68*  CALCIUM 8.9 8.5  --  8.1*  AST 23  --   --   --   ALT 15  --   --   --   ALKPHOS 57  --   --   --   BILITOT 0.8  --   --   --    ------------------------------------------------------------------------------------------------------------------ estimated creatinine clearance is 21.9 mL/min (by C-G formula based on Cr of 2.68). ------------------------------------------------------------------------------------------------------------------  Recent Labs  08/18/14 1842  HGBA1C 5.9*   ------------------------------------------------------------------------------------------------------------------ No results for input(s): CHOL, HDL, LDLCALC, TRIG, CHOLHDL, LDLDIRECT in the last 72 hours. ------------------------------------------------------------------------------------------------------------------ No results for input(s): TSH, T4TOTAL, T3FREE, THYROIDAB in the last 72 hours.  Invalid input(s): FREET3 ------------------------------------------------------------------------------------------------------------------ No results for input(s):  VITAMINB12, FOLATE, FERRITIN, TIBC, IRON, RETICCTPCT in the last 72 hours.  Coagulation profile  Recent Labs Lab 08/20/14 0532  INR 1.21    No results for input(s): DDIMER in the last 72 hours.  Cardiac Enzymes No results for input(s): CKMB, TROPONINI, MYOGLOBIN in the last 168 hours.  Invalid input(s): CK ------------------------------------------------------------------------------------------------------------------ Invalid input(s): POCBNP    Caylan Chenard D.O. on 08/20/2014 at 11:23 AM  Between 7am to 7pm - Pager - [phone]  After 7pm go to [email or web address] - password [email or web address]  And look for the night coverage person covering for me after hours  [facility] Office  [phone]

## 2014-08-20 NOTE — Progress Notes (Signed)
Patient returned from OR at approximately 0045. Patient is stable.

## 2014-08-20 NOTE — Progress Notes (Signed)
Subjective: Pt stable - pain ok - foot elevated   Objective: Vital signs in last 24 hours: Temp:  [97.7 F (36.5 C)-98.3 F (36.8 C)] 97.8 F (36.6 C) (02/13 0705) Pulse Rate:  [74-98] 86 (02/13 0705) Resp:  [14-25] 16 (02/13 0705) BP: (95-143)/(37-118) 95/57 mmHg (02/13 0705) SpO2:  [54 %-100 %] 98 % (02/13 0848)  Intake/Output from previous day: 02/12 0701 - 02/13 0700 In: 1840 [P.O.:640; I.V.:1200] Out: -  Intake/Output this shift:    Exam:  Neurovascular intact Sensation intact distally Dorsiflexion/Plantar flexion intact  Labs:  Recent Labs  08/18/14 1843 08/19/14 0632 08/20/14 0830  HGB 10.5* 10.5* 10.6*    Recent Labs  08/19/14 0632 08/20/14 0830  WBC 6.3 9.1  RBC 3.32* 3.42*  HCT 32.1* 33.1*  PLT 162 145*    Recent Labs  08/19/14 0632 08/19/14 1210 08/20/14 0830  NA 138  --  136  K 5.2* 5.4* 5.6*  CL 107  --  103  CO2 25  --  22  BUN 15  --  25*  CREATININE 1.34*  --  2.68*  GLUCOSE 171*  --  190*  CALCIUM 8.5  --  8.1*    Recent Labs  08/20/14 0532  INR 1.21    Assessment/Plan: Pt stable from ortho perspective - ok for dc next week - will be nwb for at least 2 - 3 weeks post op - pt desires dc home but that looks like a stretch to me - will likely needs short term snf   Lisa [name] 08/20/2014, 12:20 PM

## 2014-08-20 NOTE — Progress Notes (Signed)
Physical Therapy Treatment Patient Details Name: Lisa [name] MRN: [id number] DOB: 30-Jun-1941 Today's Date: 08/20/2014    History of Present Illness 74 y.o. Female with PMH of HTN, DM, CAD that was adm to ER after fall at home. X-ray revealed Rt ankle fx, post ORIF, TTWB in short leg splint.     PT Comments    Patient asked about using crutches vs RW on arrival.  Trial of axillary crutches per patient request.  Patient states "pain is much better than it was yesterday."  Min assist for bed mobility back into bed for management of legs, only able to stand with crutches and multiple near losses of balance with +2 assist.  Recommend continue with RW for transfers instead of crutches.  Patient with Fair compliance of new Touch-down WB status, but continues to require +2 assist for mobility, and still unable to ambulate.  Patient will require continued therapy for transfer trials and w/c mobility training until able to ambulate and/or weight bearing status restored fully.  Follow Up Recommendations  SNF;Supervision/Assistance - 24 hour     Equipment Recommendations  Hospital bed;Wheelchair (measurements PT);Wheelchair cushion (measurements PT)    Recommendations for Other Services OT consult     Precautions / Restrictions Precautions Precautions: Fall Precaution Comments: pt reports multiple falls at home Restrictions Weight Bearing Restrictions: Yes RLE Weight Bearing: Touchdown weight bearing    Mobility  Bed Mobility Overal bed mobility: Needs Assistance Bed Mobility: Supine to Sit;Sit to Supine     Supine to sit: Supervision;HOB elevated Sit to supine: Min assist (for LE management)   General bed mobility comments: HOB elevated, use of rail  Transfers Overall transfer level: Needs assistance Equipment used: Crutches;2 person hand held assist Transfers: Sit to/from Stand Sit to Stand: From elevated surface;Max assist;+2 physical assistance         General transfer  comment: Poor standing quality with crutches, Fair compliance with WB status.    Ambulation/Gait             General Gait Details: Transfers only at this time, due to [facility] status   Stairs            Wheelchair Mobility    Modified Rankin (Stroke Patients Only)       Balance Overall balance assessment: History of Falls   Sitting balance-Leahy Scale: Good Sitting balance - Comments: sat EOB ~ 5 min; no c/o dizziness     Standing balance-Leahy Scale: Zero Standing balance comment: +2 assist, porsterior lean tendency, TTWB R LE                    Cognition Arousal/Alertness: Awake/alert Behavior During Therapy: WFL for tasks assessed/performed (Moments of impulsive) Overall Cognitive Status: Within Functional Limits for tasks assessed                      Exercises General Exercises - Lower Extremity Heel Slides: AAROM;Right;5 reps;Supine Straight Leg Raises: AROM;Right;5 reps;Seated    General Comments        Pertinent Vitals/Pain Pain Assessment: 0-10 Pain Score: 6  Pain Location: Rt ankle Pain Descriptors / Indicators: Aching;Throbbing Pain Intervention(s): Monitored during session    Home Living                      Prior Function            PT Goals (current goals can now be found in the care plan section) Acute Rehab PT  Goals Patient Stated Goal: to get better then go home PT Goal Formulation: With patient Time For Goal Achievement: 08/26/14 Potential to Achieve Goals: Fair    Frequency  Min 4X/week (set in middle ; due to may not qualify for SNF)    PT Plan      Co-evaluation             End of Session Equipment Utilized During Treatment: Gait belt;Oxygen Activity Tolerance: Patient limited by fatigue Patient left: in bed;with call bell/phone within reach     Time: 1230-1310 PT Time Calculation (min) (ACUTE ONLY): 40 min  Charges:  $Therapeutic Activity: 23-37 mins                    G Codes:   Functional Assessment Tool Used: clinical judgement Functional Limitation: Mobility: Walking and moving around Mobility: Walking and Moving Around Current Status ([id number]([id number]): At least 60 percent but less than 80 percent impaired, limited or restricted Mobility: Walking and Moving Around Goal Status [phone]([id number]): At least 1 percent but less than 20 percent impaired, limited or restricted   Freida BusmanAllen, Anniston Nellums L 08/20/2014, 2:02 PM

## 2014-08-20 NOTE — Progress Notes (Signed)
Pt placed on auto titrate CPAP with a max pressure of 20cmH2O and min pressure of 5cmH2O. Pt using a nasal mask tolerating CPAP at this time. RT will continue to monitor.

## 2014-08-20 NOTE — Progress Notes (Signed)
OT Cancellation Note  Patient Details Name: Lisa [name] MRN: [id number] DOB: Aug 13, 1940   Cancelled Treatment:    Reason Eval/Treat Not Completed: OT screened. Pt is Medicare and current D/C plan is SNF. No apparent immediate acute care OT needs, therefore will defer OT to SNF. If OT eval is needed please call Acute Rehab Dept. at [phone] or text page OT at [phone].    Earlie RavelingStraub, Constance Hackenberg L OTR/L [phone] 08/20/2014, 2:23 PM

## 2014-08-20 NOTE — Progress Notes (Signed)
ANTICOAGULATION CONSULT NOTE - Initial Consult  Pharmacy Consult for Coumadin Indication: VTE prophylaxis  No Known Allergies  Patient Measurements: Height:  (160 cm) Weight: 247 lb 14.4 oz (112.447 kg) IBW/kg (Calculated) : 52.4  Vital Signs: Temp: 98.2 F (36.8 C) (02/12 2342) Temp Source: Oral (02/12 1447) BP: 114/58 mmHg (02/13 0015) Pulse Rate: 92 (02/13 0015)  Labs:  Recent Labs  08/18/14 1843 08/19/14 0632  HGB 10.5* 10.5*  HCT 31.8* 32.1*  PLT 160 162  CREATININE 0.86 1.34*    Estimated Creatinine Clearance: 43.8 mL/min (by C-G formula based on Cr of 1.34).   Medical History: Past Medical History  Diagnosis Date  . Diabetes mellitus without complication   . Hypertension   . CHF (congestive heart failure)   . RBBB   . Dyslipidemia   . Morbid obesity   . Diastolic dysfunction   . Coronary artery disease     s/p CABG  . Hyperlipidemia   . OSA (obstructive sleep apnea)     mild OSA with AHI 7.15 now on CPAP at 16cm H2O    Medications:  Prescriptions prior to admission  Medication Sig Dispense Refill Last Dose  . albuterol-ipratropium (COMBIVENT) 18-103 MCG/ACT inhaler Inhale 2 puffs into the lungs every 6 (six) hours as needed for wheezing or shortness of breath.    Past Month at Unknown time  . allopurinol (ZYLOPRIM) 100 MG tablet Take 100 mg by mouth daily.   08/17/2014 at Unknown time  . amLODipine-valsartan (EXFORGE) 10-320 MG per tablet Take 1 tablet by mouth daily. 30 tablet 6 08/17/2014 at Unknown time  . aspirin EC 325 MG tablet Take 1 tablet (325 mg total) by mouth daily. 30 tablet 6 Past Week at Unknown time  . buPROPion (WELLBUTRIN XL) 150 MG 24 hr tablet Take 150 mg by mouth daily.   08/17/2014 at Unknown time  . cilostazol (PLETAL) 100 MG tablet Take 100 mg by mouth 2 (two) times daily.   08/17/2014 at Unknown time  . clotrimazole-betamethasone (LOTRISONE) lotion Apply 1 application topically 2 (two) times daily. To rash at breast and  groin area as directed   Past Month at Unknown time  . ergocalciferol (VITAMIN D2) 50000 UNITS capsule Take 50,000 Units by mouth See admin instructions. Patient takes every Tuesday and Saturday   Past Week at Unknown time  . esomeprazole (NEXIUM) 40 MG capsule Take 40 mg by mouth daily before breakfast.   08/17/2014 at Unknown time  . fluticasone (FLOVENT HFA) 220 MCG/ACT inhaler Inhale 1-2 puffs into the lungs 2 (two) times daily.   08/17/2014 at Unknown time  . furosemide (LASIX) 20 MG tablet Take 1 tablet (20 mg total) by mouth every other day.   Past Week at Unknown time  . gabapentin (NEURONTIN) 300 MG capsule Take 300 mg by mouth 3 (three) times daily.   08/17/2014 at Unknown time  . imipramine (TOFRANIL) 10 MG tablet Take 50 mg by mouth at bedtime.   08/17/2014 at Unknown time  . metoprolol succinate (TOPROL XL) 25 MG 24 hr tablet Take 1 tablet (25 mg total) by mouth daily. 30 tablet 5 08/17/2014 at 7a  . nystatin-triamcinolone (MYCOLOG II) cream Apply 1 application topically 2 (two) times daily. To rash at groin and breast area as directed   Past Week at Unknown time  . predniSONE (DELTASONE) 5 MG tablet Take 5 mg by mouth 2 (two) times daily.   08/17/2014 at Unknown time  . traZODone (DESYREL) 50 MG tablet Take  50-150 mg by mouth at bedtime as needed. As needed for sleep.  May increase to three tablets if needed (150mg )   08/17/2014 at Unknown time  . celecoxib (CELEBREX) 200 MG capsule Take 200 mg by mouth 2 (two) times daily.   Taking  . simvastatin (ZOCOR) 10 MG tablet Take 1 tablet (10 mg total) by mouth at bedtime. 30 tablet 11 Taking   Scheduled:  . allopurinol  100 mg Oral Daily  . amLODipine  10 mg Oral Daily  . aspirin EC  325 mg Oral Daily  . buPROPion  150 mg Oral Daily  . cilostazol  100 mg Oral BID  . clotrimazole   Topical BID  . fluticasone  1-2 puff Inhalation BID  . gabapentin  300 mg Oral TID  . HYDROmorphone      . imipramine  50 mg Oral QHS  . insulin aspart  0-9 Units  Subcutaneous TID WC  . metoprolol succinate  25 mg Oral Daily  . nystatin-triamcinolone  1 application Topical BID  . pantoprazole  40 mg Oral Daily  . predniSONE  5 mg Oral BID WC  . simvastatin  10 mg Oral QHS  . sodium chloride  3 mL Intravenous Q12H  . Vitamin D (Ergocalciferol)  50,000 Units Oral Once per day on Tue Sat   Infusions:  . sodium chloride 75 mL/hr at 08/19/14 1126    Assessment: 74yo female was getting into car when her "right leg gave out and twisted" resulting in ankle fracture, now s/p ORIF, to begin Coumadin for VTE Px.  Goal of Therapy:  INR 2-3   Plan:  Will obtain baseline INR w/ am labs and if normal will give Coumadin 5mg  po x1 and monitor INR for dose adjustments; will begin Coumadin education.  Vernard GamblesVeronda Orvetta Danielski, PharmD, BCPS  08/20/2014,12:20 AM

## 2014-08-20 NOTE — Care Management Note (Signed)
CARE MANAGEMENT NOTE 08/20/2014  Patient:  [facility]   Account Number:  [id number]  Date Initiated:  08/20/2014  Documentation initiated by:  Oliveras-Aizpurua,Blayde Bacigalupi  Subjective/Objective Assessment:   74 yo F admitted with a R ankle fx after falling at home. s/p R ankle ORIF.     Action/Plan:   CM met with pt to discuss d/c plan and PT recommendations.   Anticipated DC Date:  08/22/2014   Anticipated DC Plan:  SKILLED NURSING FACILITY  In-house referral  Clinical Social Worker      DC Planning Services  CM consult      Choice offered to / List presented to:             Status of service:  In process, will continue to follow Medicare Important Message given?   (If response is "NO", the following Medicare IM given date fields will be blank) Date Medicare IM given:   Medicare IM given by:   Date Additional Medicare IM given:   Additional Medicare IM given by:    Discharge Disposition:    Per UR Regulation:    If discussed at Long Length of Stay Meetings, dates discussed:    Comments:  08/20/14 [place], RN, BSN  PT is recommending SNF/24 hr supervision/assistance. Pt lives alone. Discussed PT recommendations. She doesn't want to go a SNF. She stated that she has an aide that comes in every day. She has a friend that can assist her 24 hr, but she is not able to pay her. She has family, but they are not able to provide 24 hr care. She is going to talk to her family to see how they can help. Informed pt that it won't be safe for her to return home alone. Explained SW referral and she agreed. Will continue to f/u.

## 2014-08-20 NOTE — Op Note (Signed)
Lisa Hews:  [name], Lisa [name]              ACCOUNT NO.:  [id number]  MEDICAL RECORD NO.:  [id number]  LOCATION:  [id number]                        FACILITY:  [facility]  PHYSICIAN:  Burnard BuntingG. Scott Oma Alpert, M.D.    DATE OF BIRTH:  17-Feb-1941  DATE OF PROCEDURE:  08/19/2014 DATE OF DISCHARGE:                              OPERATIVE REPORT   PREOPERATIVE DIAGNOSIS:  Right trimalleolar ankle fracture.  POSTOPERATIVE DIAGNOSIS:  Right trimalleolar ankle fracture.  PROCEDURE:  Open reduction and internal fixation of bimalleolar portion of trimalleolar ankle fracture.  SURGEON:  Burnard BuntingG. Scott Glori Machnik, M.D.  ASSISTANT:  None.  ANESTHESIA:  General.  INDICATIONS:  Lisa [name] is a patient with right ankle fracture.  She is diabetic.  She presents for operative management of unstable ankle fracture after explanation of risks and benefits.  PROCEDURE IN DETAIL:  The patient was brought to operating room where general endotracheal anesthesia was induced.  Preop antibiotics administered.  Time-out was called.  Right leg was prescrubbed with alcohol and Betadine, allowed to air dry, prepped with DuraPrep solution, draped in sterile manner.  Collier Flowersoban was used to cover the operative field.  Ankle Esmarch was utilized for the case.  Incision was made over the lateral malleolus.  Skin and subcutaneous tissues were sharply divided.  Full-thickness skin flaps maintained, undue tension on the skin was avoided.  The superficial peroneal nerve was protected and retracted, fracture was identified.  Irrigation was performed.  Fracture was reduced.  Bone quality was poor.  Two lag screws were placed proximal anterior to distal posterior.  A 7-hole Smith and Nephew plate was then applied.  Good stable fixation was achieved.  Attention was then directed towards the medial side.  Early fracture blister present slightly anterior to the fracture line.  This area was avoided with the incision.  The fracture was visualized, irrigated, reduced and two  4.0 cancellous cannulated screws were placed.  The syndesmosis stable.  Good reduction achieved.  Thorough irrigation performed.  Ankle Esmarch released.  Medial side was closed using 3-0 Vicryl and 3-0 nylon. Lateral side closed using interrupted inverted 2-0 Vicryl and 3-0 nylon. Bulky well-padded posterior splint applied.  The patient tolerated the procedure well without immediate complication.  Transferred to recovery room in stable condition.    Burnard BuntingG. Scott Kainan Patty, M.D.    GSD/MEDQ  D:  08/19/2014  T:  08/20/2014  Job:  [id number]

## 2014-08-21 LAB — CBC
HEMATOCRIT: 26.4 % — AB (ref 36.0–46.0)
HEMOGLOBIN: 8.5 g/dL — AB (ref 12.0–15.0)
MCH: 31 pg (ref 26.0–34.0)
MCHC: 32.2 g/dL (ref 30.0–36.0)
MCV: 96.4 fL (ref 78.0–100.0)
Platelets: 136 10*3/uL — ABNORMAL LOW (ref 150–400)
RBC: 2.74 MIL/uL — AB (ref 3.87–5.11)
RDW: 13.4 % (ref 11.5–15.5)
WBC: 10.7 10*3/uL — AB (ref 4.0–10.5)

## 2014-08-21 LAB — CREATININE, URINE, RANDOM: CREATININE, URINE: 198.28 mg/dL

## 2014-08-21 LAB — BASIC METABOLIC PANEL
Anion gap: 9 (ref 5–15)
BUN: 35 mg/dL — AB (ref 6–23)
CO2: 22 mmol/L (ref 19–32)
CREATININE: 2.49 mg/dL — AB (ref 0.50–1.10)
Calcium: 8.2 mg/dL — ABNORMAL LOW (ref 8.4–10.5)
Chloride: 102 mmol/L (ref 96–112)
GFR calc Af Amer: 21 mL/min — ABNORMAL LOW (ref >90)
GFR, EST NON AFRICAN AMERICAN: 18 mL/min — AB (ref >90)
GLUCOSE: 156 mg/dL — AB (ref 70–99)
Potassium: 5.1 mmol/L (ref 3.5–5.1)
SODIUM: 133 mmol/L — AB (ref 135–145)

## 2014-08-21 LAB — GLUCOSE, CAPILLARY
GLUCOSE-CAPILLARY: 184 mg/dL — AB (ref 70–99)
GLUCOSE-CAPILLARY: 125 mg/dL — AB (ref 70–99)
GLUCOSE-CAPILLARY: 160 mg/dL — AB (ref 70–99)
Glucose-Capillary: 145 mg/dL — ABNORMAL HIGH (ref 70–99)

## 2014-08-21 LAB — PROTIME-INR
INR: 1.21 (ref 0.00–1.49)
Prothrombin Time: 15.5 seconds — ABNORMAL HIGH (ref 11.6–15.2)

## 2014-08-21 LAB — OSMOLALITY, URINE
Osmolality, Ur: 438 mOsm/kg (ref 390–1090)
Osmolality, Ur: 324 mOsm/kg — ABNORMAL LOW (ref 390–1090)

## 2014-08-21 LAB — OSMOLALITY: Osmolality: 292 mOsm/kg (ref 275–300)

## 2014-08-21 LAB — SODIUM, URINE, RANDOM: Sodium, Ur: 32 mmol/L

## 2014-08-21 MED ORDER — WARFARIN SODIUM 5 MG PO TABS
5.0000 mg | ORAL_TABLET | Freq: Once | ORAL | Status: AC
  Administered 2014-08-21: 5 mg via ORAL
  Filled 2014-08-21: qty 1

## 2014-08-21 MED ORDER — CEFTRIAXONE SODIUM IN DEXTROSE 20 MG/ML IV SOLN
1.0000 g | INTRAVENOUS | Status: DC
  Administered 2014-08-21 – 2014-08-23 (×3): 1 g via INTRAVENOUS
  Filled 2014-08-21 (×4): qty 50

## 2014-08-21 NOTE — Discharge Instructions (Signed)
Ankle Fracture °A fracture is a break in a bone. The ankle joint is made up of three bones. These include the lower (distal) sections of your lower leg bones, called the tibia and fibula, along with a bone in your foot, called the talus. Depending on how bad the break is and if more than one ankle joint bone is broken, a cast or splint is used to protect and keep your injured bone from moving while it heals. Sometimes, surgery is required to help the fracture heal properly.  °There are two general types of fractures: °· Stable fracture. This includes a single fracture line through one bone, with no injury to ankle ligaments. A fracture of the talus that does not have any displacement (movement of the bone on either side of the fracture line) is also stable. °· Unstable fracture. This includes more than one fracture line through one or more bones in the ankle joint. It also includes fractures that have displacement of the bone on either side of the fracture line. °CAUSES °· A direct blow to the ankle.   °· Quickly and severely twisting your ankle. °· Trauma, such as a car accident or falling from a significant height. °RISK FACTORS °You may be at a higher risk of ankle fracture if: °· You have certain medical conditions. °· You are involved in high-impact sports. °· You are involved in a high-impact car accident. °SIGNS AND SYMPTOMS  °· Tender and swollen ankle. °· Bruising around the injured ankle. °· Pain on movement of the ankle. °· Difficulty walking or putting weight on the ankle. °· A cold foot below the site of the ankle injury. This can occur if the blood vessels passing through your injured ankle were also damaged. °· Numbness in the foot below the site of the ankle injury. °DIAGNOSIS  °An ankle fracture is usually diagnosed with a physical exam and X-rays. A CT scan may also be required for complex fractures. °TREATMENT  °Stable fractures are treated with a cast or splint and using crutches to avoid putting  weight on your injured ankle. This is followed by an ankle strengthening program. Some patients require a special type of cast, depending on other medical problems they may have. Unstable fractures require surgery to ensure the bones heal properly. Your health care provider will tell you what type of fracture you have and the best treatment for your condition. °HOME CARE INSTRUCTIONS  °· Review correct crutch use with your health care provider and use your crutches as directed. Safe use of crutches is extremely important. Misuse of crutches can cause you to fall or cause injury to nerves in your hands or armpits. °· Do not put weight or pressure on the injured ankle until directed by your health care provider. °· To lessen the swelling, keep the injured leg elevated while sitting or lying down. °· Apply ice to the injured area: °¨ Put ice in a plastic bag. °¨ Place a towel between your cast and the bag. °¨ Leave the ice on for 20 minutes, 2-3 times a day. °· If you have a plaster or fiberglass cast: °¨ Do not try to scratch the skin under the cast with any objects. This can increase your risk of skin infection. °¨ Check the skin around the cast every day. You may put lotion on any red or sore areas. °¨ Keep your cast dry and clean. °· If you have a plaster splint: °¨ Wear the splint as directed. °¨ You may loosen the elastic   around the splint if your toes become numb, tingle, or turn cold or blue.  Do not put pressure on any part of your cast or splint; it may break. Rest your cast only on a pillow the first 24 hours until it is fully hardened.  Your cast or splint can be protected during bathing with a plastic bag sealed to your skin with medical tape. Do not lower the cast or splint into water.  Take medicines as directed by your health care provider. Only take over-the-counter or prescription medicines for pain, discomfort, or fever as directed by your health care provider.  Do not drive a vehicle until  your health care provider specifically tells you it is safe to do so.  If your health care provider has given you a follow-up appointment, it is very important to keep that appointment. Not keeping the appointment could result in a chronic or permanent injury, pain, and disability. If you have any problem keeping the appointment, call the facility for assistance. SEEK MEDICAL CARE IF: You develop increased swelling or discomfort. SEEK IMMEDIATE MEDICAL CARE IF:   Your cast gets damaged or breaks.  You have continued severe pain.  You develop new pain or swelling after the cast was put on.  Your skin or toenails below the injury turn blue or gray.  Your skin or toenails below the injury feel cold, numb, or have loss of sensitivity to touch.  There is a bad smell or pus draining from under the cast. MAKE SURE YOU:   Understand these instructions.  Will watch your condition.  Will get help right away if you are not doing well or get worse. Document Released: 06/21/2000 Document Revised: 06/29/2013 Document Reviewed: 01/21/2013 [facility] Patient Information 2015 [place], [place]. This information is not intended to replace advice given to you by your health care provider. Make sure you discuss any questions you have with your health care provider.   Information on my medicine - Coumadin   (Warfarin)   Why was Coumadin prescribed for you? Coumadin was prescribed for you because you have a blood clot or a medical condition that can cause an increased risk of forming blood clots. Blood clots can cause serious health problems by blocking the flow of blood to the heart, lung, or brain. Coumadin can prevent harmful blood clots from forming. As a reminder your indication for Coumadin is:   Blood Clot Prevention After Orthopedic Surgery  What test will check on my response to Coumadin? While on Coumadin (warfarin) you will need to have an INR test regularly to ensure that your dose is keeping  you in the desired range. The INR (international normalized ratio) number is calculated from the result of the laboratory test called prothrombin time (PT).  If an INR APPOINTMENT HAS NOT ALREADY BEEN MADE FOR YOU please schedule an appointment to have this lab work done by your health care provider within 7 days. Your INR goal is usually a number between:  2 to 3 or your provider may give you a more narrow range like 2-2.5.  Ask your health care provider during an office visit what your goal INR is.  What  do you need to  know  About  COUMADIN? Take Coumadin (warfarin) exactly as prescribed by your healthcare provider about the same time each day.  DO NOT stop taking without talking to the doctor who prescribed the medication.  Stopping without other blood clot prevention medication to take the place of Coumadin may increase  your risk of developing a new clot or stroke.  Get refills before you run out.  What do you do if you miss a dose? If you miss a dose, take it as soon as you remember on the same day then continue your regularly scheduled regimen the next day.  Do not take two doses of Coumadin at the same time.  Important Safety Information A possible side effect of Coumadin (Warfarin) is an increased risk of bleeding. You should call your healthcare provider right away if you experience any of the following: ? Bleeding from an injury or your nose that does not stop. ? Unusual colored urine (red or dark brown) or unusual colored stools (red or black). ? Unusual bruising for unknown reasons. ? A serious fall or if you hit your head (even if there is no bleeding).  Some foods or medicines interact with Coumadin (warfarin) and might alter your response to warfarin. To help avoid this: ? Eat a balanced diet, maintaining a consistent amount of Vitamin K. ? Notify your provider about major diet changes you plan to make. ? Avoid alcohol or limit your intake to 1 drink for women and 2 drinks for  men per day. (1 drink is 5 oz. wine, 12 oz. beer, or 1.5 oz. liquor.)  Make sure that ANY health care provider who prescribes medication for you knows that you are taking Coumadin (warfarin).  Also make sure the healthcare provider who is monitoring your Coumadin knows when you have started a new medication including herbals and non-prescription products.  Coumadin (Warfarin)  Major Drug Interactions  Increased Warfarin Effect Decreased Warfarin Effect  Alcohol (large quantities) Antibiotics (esp. Septra/Bactrim, Flagyl, Cipro) Amiodarone (Cordarone) Aspirin (ASA) Cimetidine (Tagamet) Megestrol (Megace) NSAIDs (ibuprofen, naproxen, etc.) Piroxicam (Feldene) Propafenone (Rythmol SR) Propranolol (Inderal) Isoniazid (INH) Posaconazole (Noxafil) Barbiturates (Phenobarbital) Carbamazepine (Tegretol) Chlordiazepoxide (Librium) Cholestyramine (Questran) Griseofulvin Oral Contraceptives Rifampin Sucralfate (Carafate) Vitamin K   Coumadin (Warfarin) Major Herbal Interactions  Increased Warfarin Effect Decreased Warfarin Effect  Garlic Ginseng Ginkgo biloba Coenzyme Q10 Green tea St. Johns wort    Coumadin (Warfarin) FOOD Interactions  Eat a consistent number of servings per week of foods HIGH in Vitamin K (1 serving =  cup)  Collards (cooked, or boiled & drained) Kale (cooked, or boiled & drained) Mustard greens (cooked, or boiled & drained) Parsley *serving size only =  cup Spinach (cooked, or boiled & drained) Swiss chard (cooked, or boiled & drained) Turnip greens (cooked, or boiled & drained)  Eat a consistent number of servings per week of foods MEDIUM-HIGH in Vitamin K (1 serving = 1 cup)  Asparagus (cooked, or boiled & drained) Broccoli (cooked, boiled & drained, or raw & chopped) Brussel sprouts (cooked, or boiled & drained) *serving size only =  cup Lettuce, raw (green leaf, endive, romaine) Spinach, raw Turnip greens, raw & chopped   These websites have  more information on Coumadin (warfarin):  http://www.king-russell.com/www.coumadin.com; https://www.hines.net/www.ahrq.gov/consumer/coumadin.htm;

## 2014-08-21 NOTE — Progress Notes (Signed)
[facility]                                                                              Patient Demographics  Lisa [name], is a 74 y.o. female, DOB - March 27, 1941, ZOX:[id number]  Admit date - 08/18/2014   Admitting Physician Edsel Petrin, DO  Outpatient Primary MD for the patient is Laurena Slimmer, MD  LOS - 2   Chief Complaint  Patient presents with  . Ankle Pain      HPI on 08/18/2014 Lisa [name] is a 74 y.o. female with a history of hypertension, diabetes, coronary artery disease that presents to the emergency department after falling. Patient states she was attempting to get into her friend's car at which point her ankle rolled and she fell. She denies feeling dizzy, shortness of breath, chest pain prior to the episode. Patient does state that she has frequent pains due to arthritis. X-ray done the ER the right ankle does show fracture. Orthopedics, Dr. Montez Morita was called. [facility] was asked to admit.  Assessment & Plan   Right ankle fracture -Patient's orthopedist, Dr. Montez Morita made aware of patient's admission -X-ray of the right ankle shows fracture of the medial malleolus and distal fibular diaphysis, subtle fracture posterior tibial malleolus -Continue pain control -Patient does live alone and has a aide that assists her 7 days a week for 2-3 hours per day. -Spoke with Dr. Montez Morita, orthopedist, who felt that after reviewing the x-rays, patient may need intervention and spoke with Dr. August Saucer -Patient s/p ORIF right ankle 08/19/2014 -PT consulted and recommended nursing home; although patient is relunctant -Will speak to SW regarding placement  Acute on CKD, stage III? -Creatinine 2.49, despite IVF -Renal US: Right renal cysts, no obstruction, normal echogenicity and renal cortical thickness -Monitor intake and output; patient has had poor output- 500cc/24hrs -Continue IVF  -Avoid nephrotoxic agents -Pending urine Cr, Na, osm -Will consult nephrology for  assistance  Hyperkalemia -resolved -Was given Kayexalate, insulin/ D50amp, calcium gluconate -Will continue to monitor, likely secondary to ACEI and AKI  Urinary retention -Patient had bladder scan 2/13, found to have >400cc  -Foley catheter placed   Urinary tract infection -UA obtained: WBC11-20, large leukocytes  -Urine culture pending -Started on ceftriaxone  Essential Hypertension -Continue amlodipine, metoprolol -Will hold valsartan and Lasix   Diabetes mellitus, type II, diet controlled -Continue insulin sliding scale, CBG monitoring -Hemoglobin A1c 5.9  GERD -Continue PPI  Morbid obesity -Should be discussed with her primary care physician  History of coronary artery disease -Patient denies any chest pain at this time -Continue home medications, aspirin, beta blocker, statin  Obstructive sleep apnea -Continue sleep apnea daily at bedtime  Chronic diastolic heart failure -Echocardiogram in April 2015 shows an EF of 50-55%, normal systolic function -Will monitor daily weights, intake and output -Patient appears compensated and euvolemic at this time -Will hold Lasix  Code Status: Full  Family Communication: None at bedside  Disposition Plan: Admitted, Treating AKI, pending SNF placement  Time Spent in minutes   30 minutes  Procedures  Right Ankle ORIF Renal US  Consults   Orthopedics Nephrology  DVT Prophylaxis  Coumadin  Lab Results  Component Value Date  PLT 136* 08/21/2014    Medications  Scheduled Meds: . allopurinol  100 mg Oral Daily  . aspirin EC  325 mg Oral Daily  . buPROPion  150 mg Oral Daily  . cefTRIAXone (ROCEPHIN)  IV  1 g Intravenous Q24H  . cilostazol  100 mg Oral BID  . clotrimazole   Topical BID  . fluticasone  1-2 puff Inhalation BID  . gabapentin  300 mg Oral TID  . imipramine  50 mg Oral QHS  . insulin aspart  0-9 Units Subcutaneous TID WC  . metoprolol succinate  25 mg Oral Daily  . nystatin-triamcinolone  1  application Topical BID  . pantoprazole  40 mg Oral Daily  . predniSONE  5 mg Oral BID WC  . simvastatin  10 mg Oral QHS  . sodium chloride  3 mL Intravenous Q12H  . Vitamin D (Ergocalciferol)  50,000 Units Oral Once per day on Tue Sat  . Warfarin - Pharmacist Dosing Inpatient   Does not apply q1800   Continuous Infusions: . sodium chloride 75 mL/hr at 08/21/14 0256   PRN Meds:.sodium chloride, acetaminophen **OR** acetaminophen, HYDROcodone-acetaminophen, HYDROmorphone (DILAUDID) injection, ipratropium-albuterol, methocarbamol **OR** methocarbamol (ROBAXIN)  IV, metoCLOPramide **OR** metoCLOPramide (REGLAN) injection, morphine injection, morphine injection, ondansetron (ZOFRAN) IV, ondansetron **OR** ondansetron (ZOFRAN) IV, sodium chloride, traZODone  Antibiotics    Anti-infectives    Start     Dose/Rate Route Frequency Ordered Stop   08/21/14 0730  cefTRIAXone (ROCEPHIN) 1 g in dextrose 5 % 50 mL IVPB - Premix     1 g 100 mL/hr over 30 Minutes Intravenous Every 24 hours 08/21/14 0658     08/20/14 0341  ceFAZolin (ANCEF) IVPB 2 g/50 mL premix     2 g 100 mL/hr over 30 Minutes Intravenous 3 times per day 08/20/14 0341 08/20/14 1920        Subjective:   Lisa [name] seen and examined today. Patient is reluctant regarding nursing home placement. Denies any chest pain, shortness of breath, abdominal pain. Does state that she has had some problems urinating over the past several days, however was normal at home.  Objective:   Filed Vitals:   08/20/14 0848 08/20/14 1100 08/20/14 1857 08/20/14 2035  BP:   131/39 116/44  Pulse:   70   Temp:   98 F (36.7 C) 98.1 F (36.7 C)  TempSrc:   Oral Oral  Resp:   18 16  Height:      Weight:  112.855 kg (248 lb 12.8 oz)    SpO2: 98%  100% 93%    Wt Readings from Last 3 Encounters:  08/20/14 112.855 kg (248 lb 12.8 oz)  03/22/14 110.224 kg (243 lb)  09/30/13 113.853 kg (251 lb)     Intake/Output Summary (Last 24 hours) at  08/21/14 1034 Last data filed at 08/21/14 0300  Gross per 24 hour  Intake    170 ml  Output    525 ml  Net   -355 ml    Exam  General: Well developed, NAD  HEENT: NCAT, mucous membranes moist.   Cardiovascular: S1 S2 auscultated, RRR  Respiratory: Clear to auscultation bilaterally with equal chest rise  Abdomen: Soft, obese, nontender, nondistended, + bowel sounds  Extremities: RLE wrapped.  No edema, cyanosis, clubbing of LLE.  Neuro: AAOx3,nonfocal.  Psych: Normal mood and affect  Data Review   Micro Results Recent Results (from the past 240 hour(s))  Surgical pcr screen     Status: None   Collection  Time: 08/19/14  7:19 PM  Result Value Ref Range Status   MRSA, PCR NEGATIVE NEGATIVE Final   Staphylococcus aureus NEGATIVE NEGATIVE Final    Comment:        The Xpert SA Assay (FDA approved for NASAL specimens in patients over 76 years of age), is one component of a comprehensive surveillance program.  Test performance has been validated by [facility] for patients greater than or equal to 46 year old. It is not intended to diagnose infection nor to guide or monitor treatment.     Radiology Reports Dg Ankle 2 Views Right  08/20/2014   CLINICAL DATA:  74 year old female ORIF right ankle fracture  EXAM: DG C-ARM 61-120 MIN; RIGHT ANKLE - 2 VIEW  TECHNIQUE: Two intraoperative fluoroscopic views  FLUOROSCOPY TIME:  Fluoroscopy Time (in minutes and seconds): Not specified  COMPARISON:  08/18/2013  FINDINGS: ORIF hardware at the distal fibula and medial malleolus. Near anatomic alignment. Adverse features.  IMPRESSION: ORIF right ankle, no adverse features.   Electronically Signed   By: Odessa Fleming M.D.   On: 08/20/2014 00:19   Dg Ankle Complete Right  08/18/2014   CLINICAL DATA:  Pain and swelling after fall  EXAM: RIGHT ANKLE - COMPLETE 3+ VIEW  COMPARISON:  None.  FINDINGS: Frontal, oblique, and lateral views were obtained. There is a fracture of the distal fibular  diaphysis with posterior and lateral displacement of the distal fracture fragment with respect proximal fragment. There is an obliquely oriented fracture of the medial malleolus which is mildly displaced distally. There is a subtle fracture of the posterior tibial malleolus in essentially anatomic alignment. The ankle mortise appears grossly intact. There are spurs arising from the calcaneus. There are foci of vascular calcification.  IMPRESSION: Fracture of the medial malleolus and distal fibular diaphysis. Subtle fracture posterior tibial malleolus. Ankle mortise grossly intact. Extensive arterial vascular calcification. Calcaneal spurs present.   Electronically Signed   By: Bretta Bang III M.D.   On: 08/18/2014 14:12   US Renal  08/20/2014   CLINICAL DATA:  Elevated serum creatinine  EXAM: RENAL ULTRASOUND  COMPARISON:  CT abdomen and pelvis August 19, 2011  FINDINGS: Right Kidney:  Length: 10.1 cm. Echogenicity and renal cortical thickness are within normal limits. No perinephric fluid or hydronephrosis visualized. There is a 5.1 x 4.2 x 4.3 cm cyst in the mid right kidney. A second cyst in the upper pole region measures 2.0 x 1.9 x 2.3 cm. No sonographically demonstrable calculus or ureterectasis.  Left Kidney:  Length: 10.5 cm. Echogenicity within normal limits. No mass, perinephric fluid, or hydronephrosis visualized. No sonographically demonstrable calculus or ureterectasis.  Bladder:  Decompressed by Foley catheter and cannot be assessed.  IMPRESSION: Renal cysts on right. No obstructing foci in either kidney. The echogenicity of the kidneys and the renal cortical thickness are felt to be within normal limits.   Electronically Signed   By: Bretta Bang III M.D.   On: 08/20/2014 17:40   Dg Knee Complete 4 Views Right  08/18/2014   CLINICAL DATA:  Pain and swelling after fall  EXAM: RIGHT KNEE - COMPLETE 4+ VIEW  COMPARISON:  May 13, 2013  FINDINGS: Frontal, lateral, and bilateral  oblique views were obtained. 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 There is evidence of an old fracture along the superior aspect of the patella with fragmentation in this area. The appearance in this area is stable. It should be  noted that subtle re-injury in this area could be easily obscured given the prior injury. No acute fracture is seen. No dislocation. There is a joint effusion currently.  There is extensive arterial vascular calcification.  IMPRESSION: Moderate joint effusion. Evidence of old trauma to the superior patella compared to the previous study. Re-injury in this area cannot be excluded, however. No other evidence of fracture. No dislocation.   Electronically Signed   By: Bretta BangWilliam  Woodruff III M.D.   On: 08/18/2014 14:16   Dg Ankle Right Port  08/20/2014   CLINICAL DATA:  74 year old female status post ORIF. Initial encounter.  EXAM: PORTABLE RIGHT ANKLE - 2 VIEW  COMPARISON:  08/19/2014 and earlier  FINDINGS: Portable AP and cross-table lateral views. Splint material degrades bone detail. ORIF distal fibula and medial malleolus. Near anatomic alignment. No adverse features.  IMPRESSION: No adverse features status post right ankle ORIF.   Electronically Signed   By: Odessa FlemingH  Hall M.D.   On: 08/20/2014 00:34   Dg C-arm 1-60 Min  08/20/2014   CLINICAL DATA:  74 year old female ORIF right ankle fracture  EXAM: DG C-ARM 61-120 MIN; RIGHT ANKLE - 2 VIEW  TECHNIQUE: Two intraoperative fluoroscopic views  FLUOROSCOPY TIME:  Fluoroscopy Time (in minutes and seconds): Not specified  COMPARISON:  08/18/2013  FINDINGS: ORIF hardware at the distal fibula and medial malleolus. Near anatomic alignment. Adverse features.  IMPRESSION: ORIF right ankle, no adverse features.   Electronically Signed   By: Odessa FlemingH  Hall M.D.   On: 08/20/2014 00:19    CBC  Recent Labs Lab 08/18/14 1843 08/19/14 0632 08/20/14 0830 08/21/14 0914  WBC 7.8 6.3 9.1 10.7*    HGB 10.5* 10.5* 10.6* 8.5*  HCT 31.8* 32.1* 33.1* 26.4*  PLT 160 162 145* 136*  MCV 94.4 96.7 96.8 96.4  MCH 31.2 31.6 31.0 31.0  MCHC 33.0 32.7 32.0 32.2  RDW 13.4 13.5 13.5 13.4    Chemistries   Recent Labs Lab 08/18/14 1843 08/19/14 0632 08/19/14 1210 08/20/14 0830 08/20/14 1658 08/21/14 0914  NA 138 138  --  136 135 133*  K 4.4 5.2* 5.4* 5.6* 5.0 5.1  CL 106 107  --  103 104 102  CO2 26 25  --  22 21 22   GLUCOSE 88 171*  --  190* 80 156*  BUN 11 15  --  25* 29* 35*  CREATININE 0.86 1.34*  --  2.68* 2.62* 2.49*  CALCIUM 8.9 8.5  --  8.1* 8.2* 8.2*  AST 23  --   --   --   --   --   ALT 15  --   --   --   --   --   ALKPHOS 57  --   --   --   --   --   BILITOT 0.8  --   --   --   --   --    ------------------------------------------------------------------------------------------------------------------ estimated creatinine clearance is 23.6 mL/min (by C-G formula based on Cr of 2.49). ------------------------------------------------------------------------------------------------------------------  Recent Labs  08/18/14 1842  HGBA1C 5.9*   ------------------------------------------------------------------------------------------------------------------ No results for input(s): CHOL, HDL, LDLCALC, TRIG, CHOLHDL, LDLDIRECT in the last 72 hours. ------------------------------------------------------------------------------------------------------------------ No results for input(s): TSH, T4TOTAL, T3FREE, THYROIDAB in the last 72 hours.  Invalid input(s): FREET3 ------------------------------------------------------------------------------------------------------------------ No results for input(s): VITAMINB12, FOLATE, FERRITIN, TIBC, IRON, RETICCTPCT in the last 72 hours.  Coagulation profile  Recent Labs Lab 08/20/14 0532 08/21/14 0544  INR 1.21 1.21    No results for input(s):  DDIMER in the last 72 hours.  Cardiac Enzymes No results for input(s): CKMB,  TROPONINI, MYOGLOBIN in the last 168 hours.  Invalid input(s): CK ------------------------------------------------------------------------------------------------------------------ Invalid input(s): POCBNP    Lisa [name] D.O. on 08/21/2014 at 10:34 AM  Between 7am to 7pm - Pager - [phone]  After 7pm go to [email or web address] - password [email or web address]  And look for the night coverage person covering for me after hours  [facility] Office  [phone]

## 2014-08-21 NOTE — Progress Notes (Signed)
Pt stable pain ok Right foot mobile perfused and sensate Plan dc when medically ready - nwb rle for walking - ok to tdwb for pivots and transfers on right leg

## 2014-08-21 NOTE — Progress Notes (Signed)
ANTICOAGULATION CONSULT NOTE - Initial Consult  Pharmacy Consult for Coumadin Indication: VTE prophylaxis  No Known Allergies  Patient Measurements: Height:  (160 cm) Weight: 248 lb 12.8 oz (112.855 kg) IBW/kg (Calculated) : 52.4  Vital Signs:    Labs:  Recent Labs  08/19/14 0632 08/20/14 0532 08/20/14 0830 08/20/14 1658 08/21/14 0544 08/21/14 0914  HGB 10.5*  --  10.6*  --   --  8.5*  HCT 32.1*  --  33.1*  --   --  26.4*  PLT 162  --  145*  --   --  136*  LABPROT  --  15.5*  --   --  15.5*  --   INR  --  1.21  --   --  1.21  --   CREATININE 1.34*  --  2.68* 2.62*  --  2.49*    Estimated Creatinine Clearance: 23.6 mL/min (by C-G formula based on Cr of 2.49).   Medical History: Past Medical History  Diagnosis Date  . Diabetes mellitus without complication   . Hypertension   . CHF (congestive heart failure)   . RBBB   . Dyslipidemia   . Morbid obesity   . Diastolic dysfunction   . Coronary artery disease     s/p CABG  . Hyperlipidemia   . OSA (obstructive sleep apnea)     mild OSA with AHI 7.15 now on CPAP at 16cm H2O    Medications:  Prescriptions prior to admission  Medication Sig Dispense Refill Last Dose  . albuterol-ipratropium (COMBIVENT) 18-103 MCG/ACT inhaler Inhale 2 puffs into the lungs every 6 (six) hours as needed for wheezing or shortness of breath.    Past Month at Unknown time  . allopurinol (ZYLOPRIM) 100 MG tablet Take 100 mg by mouth daily.   08/17/2014 at Unknown time  . amLODipine-valsartan (EXFORGE) 10-320 MG per tablet Take 1 tablet by mouth daily. 30 tablet 6 08/17/2014 at Unknown time  . aspirin EC 325 MG tablet Take 1 tablet (325 mg total) by mouth daily. 30 tablet 6 Past Week at Unknown time  . buPROPion (WELLBUTRIN XL) 150 MG 24 hr tablet Take 150 mg by mouth daily.   08/17/2014 at Unknown time  . cilostazol (PLETAL) 100 MG tablet Take 100 mg by mouth 2 (two) times daily.   08/17/2014 at Unknown time  . clotrimazole-betamethasone  (LOTRISONE) lotion Apply 1 application topically 2 (two) times daily. To rash at breast and groin area as directed   Past Month at Unknown time  . ergocalciferol (VITAMIN D2) 50000 UNITS capsule Take 50,000 Units by mouth See admin instructions. Patient takes every Tuesday and Saturday   Past Week at Unknown time  . esomeprazole (NEXIUM) 40 MG capsule Take 40 mg by mouth daily before breakfast.   08/17/2014 at Unknown time  . fluticasone (FLOVENT HFA) 220 MCG/ACT inhaler Inhale 1-2 puffs into the lungs 2 (two) times daily.   08/17/2014 at Unknown time  . furosemide (LASIX) 20 MG tablet Take 1 tablet (20 mg total) by mouth every other day.   Past Week at Unknown time  . gabapentin (NEURONTIN) 300 MG capsule Take 300 mg by mouth 3 (three) times daily.   08/17/2014 at Unknown time  . imipramine (TOFRANIL) 10 MG tablet Take 50 mg by mouth at bedtime.   08/17/2014 at Unknown time  . metoprolol succinate (TOPROL XL) 25 MG 24 hr tablet Take 1 tablet (25 mg total) by mouth daily. 30 tablet 5 08/17/2014 at 7a  .  nystatin-triamcinolone (MYCOLOG II) cream Apply 1 application topically 2 (two) times daily. To rash at groin and breast area as directed   Past Week at Unknown time  . predniSONE (DELTASONE) 5 MG tablet Take 5 mg by mouth 2 (two) times daily.   08/17/2014 at Unknown time  . traZODone (DESYREL) 50 MG tablet Take 50-150 mg by mouth at bedtime as needed. As needed for sleep.  May increase to three tablets if needed (150mg )   08/17/2014 at Unknown time  . celecoxib (CELEBREX) 200 MG capsule Take 200 mg by mouth 2 (two) times daily.   Taking  . simvastatin (ZOCOR) 10 MG tablet Take 1 tablet (10 mg total) by mouth at bedtime. 30 tablet 11 Taking   Scheduled:  . allopurinol  100 mg Oral Daily  . aspirin EC  325 mg Oral Daily  . buPROPion  150 mg Oral Daily  . cefTRIAXone (ROCEPHIN)  IV  1 g Intravenous Q24H  . cilostazol  100 mg Oral BID  . clotrimazole   Topical BID  . fluticasone  1-2 puff Inhalation BID   . gabapentin  300 mg Oral TID  . imipramine  50 mg Oral QHS  . insulin aspart  0-9 Units Subcutaneous TID WC  . metoprolol succinate  25 mg Oral Daily  . nystatin-triamcinolone  1 application Topical BID  . pantoprazole  40 mg Oral Daily  . predniSONE  5 mg Oral BID WC  . simvastatin  10 mg Oral QHS  . sodium chloride  3 mL Intravenous Q12H  . Vitamin D (Ergocalciferol)  50,000 Units Oral Once per day on Tue Sat  . warfarin  5 mg Oral ONCE-1800  . Warfarin - Pharmacist Dosing Inpatient   Does not apply q1800   Infusions:  . sodium chloride 75 mL/hr at 08/21/14 0256    Assessment: 73yo female was getting into car when her "right leg gave out and twisted" resulting in ankle fracture, now s/p ORIF, to begin Coumadin for VTE Px. INR 1.2.  Goal of Therapy:  INR 2-3   Plan:  -Warfarin 5 mg po x1 -Daily INR -Needs education    Agapito GamesAlison Rhemi Balbach, PharmD, BCPS Clinical Pharmacist Pager: [phone] 08/21/2014 11:03 AM

## 2014-08-21 NOTE — Consult Note (Signed)
Renal Service Consult Note [facility]  Lisa [name] 08/21/2014 Tamara Monteith D Requesting Physician:  Dr Catha Gosselin  Reason for Consult:  Acute on CKD HPI: The patient is a 74 y.o. year-old with hx of DM, HTN, CHF, MO, CAD s/p CABG and OSA presented to ED 2/11 after fall and "twisted" ankle. Dx'd with ankle fracture and underwent ORIF R ankle on 08/19/14.  Creat on admit was 0.86 and gradually has worsened up to 2.49 today.  Yesterday was 2.68.  UOP 525 cc yesterday.  Cumulative I/O = 2L in and 875cc out since admission.   Renal US 10.2 / 10.5 cm kidneys, normal cortical thickness and echo pattern, no hydro UA 2/13 > 1.020, 7.0, 100 prot, large LE, 11-20 wbc's, no rbc's, many bact UNa 32, Ucreat 1.98 ECHO 4/15 > LV EF 50-55%, no WMA, biatrial dilatation o/w negative CXR not done  Inpatient meds > allopurinol, norvasc, irbesartan (stopped 2/12), asa, Wellbutrin, Ancef IV x 2 doses, Rocephin started today, celecoxib 2/10 - 2/11, Pletal, fluticasone, lasix one dose, gabapentin, dilaudid IV, imipramine, insulin, metoprolol, Mycolog cream, pantoprazole, prednisone, Zocor, Kayexalate, vit D, warfarin, vicodin, MSO4   Date   Creat   2008-2010 1.0- 3.70 2011  0.78- 0.93  2013  1.17 2014  1.2 3/15  1.1 9/15   1.0 Feb 2016 0.86 > 2.49   Chart review: 12/04 - CHF, HTN'sive heart disease, RA, OA, obesity, NIDMM, HTN 4/05 - SOB admitted for elective heart cath > multivessel disease, underwent CABG on 08/10/03.  Postp required diuresis, BIPAP, fevers and abx. Developed Cdif rx w Flagyl 12/08 - sacral decub ulcer, s/p R TKA 8/10 - SOB d/t combination of OSA, OHS, morbid obesity. Diuresed.  12/10 - AKI, high K, NAGA, UTI, IDDM, anemia, gout, DJD, OHS/OSA, hx CABG 2/11 - acute pancreatitis, acute bronchitis, atypcial CP, CAD, DM, HTN 3/11 - acute pancreatitis, s/p lap chole, Klebs UTI, DM2, OSH, gouty arthritis, HL, CAD, anemia   ROS  no sob , cough, cp, fever, no n/v/d, no abd  pain   no skin rash  chronic arthritis pains  Past Medical History  Past Medical History  Diagnosis Date  . Diabetes mellitus without complication   . Hypertension   . CHF (congestive heart failure)   . RBBB   . Dyslipidemia   . Morbid obesity   . Diastolic dysfunction   . Coronary artery disease     s/p CABG  . Hyperlipidemia   . OSA (obstructive sleep apnea)     mild OSA with AHI 7.15 now on CPAP at 16cm H2O   Past Surgical History  Past Surgical History  Procedure Laterality Date  . Stents      cardiac  . Cardiac surgery    . Knee surgery      bilateral  . Back surgery    . Rotator cuff surgery    . Coronary artery bypass graft    . Cardiac catheterization     Family History  Family History  Problem Relation Age of Onset  . Heart attack Mother   . Heart disease Mother   . Stomach cancer Father    Social History  reports that she has quit smoking. She does not have any smokeless tobacco history on file. She reports that she does not drink alcohol or use illicit drugs. Allergies No Known Allergies Home medications Prior to Admission medications   Medication Sig Start Date End Date Taking? Authorizing Provider  albuterol-ipratropium (COMBIVENT) 18-103 MCG/ACT inhaler Inhale  2 puffs into the lungs every 6 (six) hours as needed for wheezing or shortness of breath.    Yes Historical Provider, MD  allopurinol (ZYLOPRIM) 100 MG tablet Take 100 mg by mouth daily.   Yes Historical Provider, MD  amLODipine-valsartan (EXFORGE) 10-320 MG per tablet Take 1 tablet by mouth daily. 03/22/14  Yes Quintella Reichert, MD  aspirin EC 325 MG tablet Take 1 tablet (325 mg total) by mouth daily. 03/22/14  Yes Quintella Reichert, MD  buPROPion (WELLBUTRIN XL) 150 MG 24 hr tablet Take 150 mg by mouth daily.   Yes Historical Provider, MD  cilostazol (PLETAL) 100 MG tablet Take 100 mg by mouth 2 (two) times daily.   Yes Historical Provider, MD  clotrimazole-betamethasone (LOTRISONE) lotion Apply 1  application topically 2 (two) times daily. To rash at breast and groin area as directed   Yes Historical Provider, MD  ergocalciferol (VITAMIN D2) 50000 UNITS capsule Take 50,000 Units by mouth See admin instructions. Patient takes every Tuesday and Saturday   Yes Historical Provider, MD  esomeprazole (NEXIUM) 40 MG capsule Take 40 mg by mouth daily before breakfast.   Yes Historical Provider, MD  fluticasone (FLOVENT HFA) 220 MCG/ACT inhaler Inhale 1-2 puffs into the lungs 2 (two) times daily.   Yes Historical Provider, MD  furosemide (LASIX) 20 MG tablet Take 1 tablet (20 mg total) by mouth every other day. 10/04/13  Yes Quintella Reichert, MD  gabapentin (NEURONTIN) 300 MG capsule Take 300 mg by mouth 3 (three) times daily.   Yes Historical Provider, MD  imipramine (TOFRANIL) 10 MG tablet Take 50 mg by mouth at bedtime.   Yes Historical Provider, MD  metoprolol succinate (TOPROL XL) 25 MG 24 hr tablet Take 1 tablet (25 mg total) by mouth daily. 09/22/13  Yes Quintella Reichert, MD  nystatin-triamcinolone (MYCOLOG II) cream Apply 1 application topically 2 (two) times daily. To rash at groin and breast area as directed   Yes Historical Provider, MD  predniSONE (DELTASONE) 5 MG tablet Take 5 mg by mouth 2 (two) times daily. 03/08/14  Yes Historical Provider, MD  traZODone (DESYREL) 50 MG tablet Take 50-150 mg by mouth at bedtime as needed. As needed for sleep.  May increase to three tablets if needed ( )   Yes Historical Provider, MD  celecoxib (CELEBREX) 200 MG capsule Take 200 mg by mouth 2 (two) times daily. 01/19/14   Historical Provider, MD  HYDROcodone-acetaminophen (NORCO) 5-325 MG per tablet Take 1-2 tablets by mouth every 6 (six) hours as needed. 08/18/14   Toy Cookey, MD  simvastatin (ZOCOR) 10 MG tablet Take 1 tablet (10 mg total) by mouth at bedtime. 11/12/13   Quintella Reichert, MD   Liver Function Tests  Recent Labs Lab 08/18/14 1843  AST 23  ALT 15  ALKPHOS 57  BILITOT 0.8  PROT 6.1   ALBUMIN 2.9*   No results for input(s): LIPASE, AMYLASE in the last 168 hours. CBC  Recent Labs Lab 08/19/14 0632 08/20/14 0830 08/21/14 0914  WBC 6.3 9.1 10.7*  HGB 10.5* 10.6* 8.5*  HCT 32.1* 33.1* 26.4*  MCV 96.7 96.8 96.4  PLT 162 145* 136*   Basic Metabolic Panel  Recent Labs Lab 08/18/14 1843 08/19/14 0632 08/19/14 1210 08/20/14 0830 08/20/14 1658 08/21/14 0914  NA 138 138  --  136 135 133*  K 4.4 5.2* 5.4* 5.6* 5.0 5.1  CL 106 107  --  103 104 102  CO2 26 25  --  22 21 22   GLUCOSE 88 171*  --  190* 80 156*  BUN 11 15  --  25* 29* 35*  CREATININE 0.86 1.34*  --  2.68* 2.62* 2.49*  CALCIUM 8.9 8.5  --  8.1* 8.2* 8.2*    Filed Vitals:   08/20/14 0848 08/20/14 1100 08/20/14 1857 08/20/14 2035  BP:   131/39 116/44  Pulse:   70   Temp:   98 F (36.7 C) 98.1 F (36.7 C)  TempSrc:   Oral Oral  Resp:   18 16  Height:      Weight:  112.855 kg (248 lb 12.8 oz)    SpO2: 98%  100% 93%   Exam Elderly obese AAF no distress lying flat No rash, cyanosis or gangrene Sclera anicteric, throat clear No jvd or bruits Chest clear bilat RRR no MRG Abd obese, soft, NTND, +BS GU foley in place No LE or UE edema No ulcers, gangrene or joint effusions Neuro is nf, ox 3  Renal US 10.2 / 10.5 cm kidneys, normal cortical thickness and echo pattern, no hydro UA 2/13 > 1.020, 7.0, 100 prot, large LE, 11-20 wbc's, no rbc's, many bact UNa 32, Ucreat 1.98 ECHO 4/15 > LV EF 50-55%, no WMA, biatrial dilatation o/w negative CXR not done   Assessment: 1. Acute renal failure due to combination of vol depletion / ARB/ COX II agent.  ARB and COX II agent appropriately discontinued.  WBC's only on UA.  Has room for more volume, will increase IVF"s a bit.  FeNa low d/w vol depletion. Starting to improve.  Would avoid ARB indefinitely in this patient, use Norvasc, BB, hydralazine for BP control.  Avoid COX II agents for 2 months.  Will follow 2. Ankle  fracture 3. HTN 4. Obesity 5. DM2 6. CAD hx CABG 7. HL   Plan- as above  Vinson Moselleob Derita Michelsen MD (pgr) [phone]    ([phone]) [phone] 08/21/2014, 2:08 PM

## 2014-08-22 ENCOUNTER — Encounter (HOSPITAL_COMMUNITY): Payer: Self-pay | Admitting: Orthopedic Surgery

## 2014-08-22 DIAGNOSIS — N179 Acute kidney failure, unspecified: Secondary | ICD-10-CM

## 2014-08-22 LAB — CBC
HCT: 23.6 % — ABNORMAL LOW (ref 36.0–46.0)
HEMOGLOBIN: 7.9 g/dL — AB (ref 12.0–15.0)
MCH: 31.5 pg (ref 26.0–34.0)
MCHC: 33.5 g/dL (ref 30.0–36.0)
MCV: 94 fL (ref 78.0–100.0)
Platelets: 127 10*3/uL — ABNORMAL LOW (ref 150–400)
RBC: 2.51 MIL/uL — ABNORMAL LOW (ref 3.87–5.11)
RDW: 13.1 % (ref 11.5–15.5)
WBC: 8.3 10*3/uL (ref 4.0–10.5)

## 2014-08-22 LAB — GLUCOSE, CAPILLARY
GLUCOSE-CAPILLARY: 121 mg/dL — AB (ref 70–99)
GLUCOSE-CAPILLARY: 128 mg/dL — AB (ref 70–99)
Glucose-Capillary: 157 mg/dL — ABNORMAL HIGH (ref 70–99)
Glucose-Capillary: 104 mg/dL — ABNORMAL HIGH (ref 70–99)
Glucose-Capillary: 122 mg/dL — ABNORMAL HIGH (ref 70–99)

## 2014-08-22 LAB — BASIC METABOLIC PANEL
Anion gap: 4 — ABNORMAL LOW (ref 5–15)
BUN: 43 mg/dL — AB (ref 6–23)
CALCIUM: 7.6 mg/dL — AB (ref 8.4–10.5)
CO2: 24 mmol/L (ref 19–32)
CREATININE: 2.14 mg/dL — AB (ref 0.50–1.10)
Chloride: 103 mmol/L (ref 96–112)
GFR calc Af Amer: 25 mL/min — ABNORMAL LOW (ref >90)
GFR, EST NON AFRICAN AMERICAN: 21 mL/min — AB (ref >90)
GLUCOSE: 118 mg/dL — AB (ref 70–99)
POTASSIUM: 4.5 mmol/L (ref 3.5–5.1)
Sodium: 131 mmol/L — ABNORMAL LOW (ref 135–145)

## 2014-08-22 LAB — PROTIME-INR
INR: 1.42 (ref 0.00–1.49)
Prothrombin Time: 17.5 seconds — ABNORMAL HIGH (ref 11.6–15.2)

## 2014-08-22 MED ORDER — GABAPENTIN 300 MG PO CAPS
300.0000 mg | ORAL_CAPSULE | Freq: Two times a day (BID) | ORAL | Status: DC
  Administered 2014-08-22 – 2014-08-23 (×2): 300 mg via ORAL
  Filled 2014-08-22 (×2): qty 1

## 2014-08-22 MED ORDER — PHENOL 1.4 % MT LIQD
1.0000 | OROMUCOSAL | Status: DC | PRN
  Filled 2014-08-22: qty 177

## 2014-08-22 MED ORDER — WARFARIN SODIUM 5 MG PO TABS
5.0000 mg | ORAL_TABLET | Freq: Once | ORAL | Status: AC
Start: 2014-08-22 — End: 2014-08-22
  Administered 2014-08-22: 5 mg via ORAL
  Filled 2014-08-22: qty 1

## 2014-08-22 NOTE — Progress Notes (Signed)
Pt's CPAP set up at bedside. Pt states she will call RN to attach O2 when she is ready and is able to place mask on herself.

## 2014-08-22 NOTE — Progress Notes (Signed)
Subjective:  UOP adequate- creatinine stable to improved- trying to get blood test right now- pt feels well  Objective Vital signs in last 24 hours: Filed Vitals:   08/20/14 2035 08/21/14 1500 08/21/14 2121 08/22/14 0649  BP: 116/44 109/53 113/34 113/37  Pulse:  84 64 65  Temp: 98.1 F (36.7 C) 97.8 F (36.6 C) 98.2 F (36.8 C) 98.2 F (36.8 C)  TempSrc: Oral Oral Oral Oral  Resp: Height:      Weight:    121.2 kg (267 lb 3.2 oz)  SpO2: 93% 94% 96% 96%   Weight change: 8.345 kg (18 lb 6.4 oz)  Intake/Output Summary (Last 24 hours) at 08/22/14 0813 Last data filed at 08/22/14 0700  Gross per 24 hour  Intake   3068 ml  Output    860 ml  Net   2208 ml    Assessment/ Plan: Pt is Lisa [name] 74 y.o. yo female who was admitted on 08/18/2014 with ankle fracture req surgical management.  She had AKI post op (crt 0.86 at baseline) peaked at 2.6- was 2.4 yesterday- pending for today  Assessment/Plan: 1. AKI- thought due to hypotension/ARB/Cox 2 inhib- now all offending meds stopped.  Nonoliguric.  Awaiting creatinine today.  Hopefully with normal renal function at baseline will recover without needing any dialysis support.  Question of UTI- Urine culture is pending given pyuria and started on rocephin.  2. Anemia- due to surgery- follow  3. HTN/volume- BP lowish- only on low dose beta blocker- no need to restart ARB at this time and would not restart it  Lisa Lisa [name]    Labs: Basic Metabolic Panel:  Recent Labs Lab 08/20/14 0830 08/20/14 1658 08/21/14 0914  NA 136 135 133*  K 5.6* 5.0 5.1  CL 103 104 102  CO2 GLUCOSE 190* 80 156*  BUN 25* 29* 35*  CREATININE 2.68* 2.62* 2.49*  CALCIUM 8.1* 8.2* 8.2*   Liver Function Tests:  Recent Labs Lab 08/18/14 1843  AST 23  ALT 15  ALKPHOS 57  BILITOT 0.8  PROT 6.1  ALBUMIN 2.9*   No results for input(s): LIPASE, AMYLASE in the last 168 hours. No results for input(s): AMMONIA in the last 168  hours. CBC:  Recent Labs Lab 08/18/14 1843 08/19/14 0632 08/20/14 0830 08/21/14 0914  WBC 7.8 6.3 9.1 10.7*  HGB 10.5* 10.5* 10.6* 8.5*  HCT 31.8* 32.1* 33.1* 26.4*  MCV 94.4 96.7 96.8 96.4  PLT 160 162 145* 136*   Cardiac Enzymes: No results for input(s): CKTOTAL, CKMB, CKMBINDEX, TROPONINI in the last 168 hours. CBG:  Recent Labs Lab 08/21/14 0649 08/21/14 1215 08/21/14 1703 08/21/14 2125 08/22/14 0708  GLUCAP 184* 125* 145* 160* 157*    Iron Studies: No results for input(s): IRON, TIBC, TRANSFERRIN, FERRITIN in the last 72 hours. Studies/Results: US Renal  08/20/2014   CLINICAL DATA:  Elevated serum creatinine  EXAM: RENAL ULTRASOUND  COMPARISON:  CT abdomen and pelvis August 19, 2011  FINDINGS: Right Kidney:  Length: 10.1 cm. Echogenicity and renal cortical thickness are within normal limits. No perinephric fluid or hydronephrosis visualized. There is Lisa [name] 5.1 x 4.2 x 4.3 cm cyst in the mid right kidney. Lisa [name] second cyst in the upper pole region measures 2.0 x 1.9 x 2.3 cm. No sonographically demonstrable calculus or ureterectasis.  Left Kidney:  Length: 10.5 cm. Echogenicity within normal limits. No mass, perinephric fluid, or hydronephrosis visualized. No sonographically demonstrable calculus or ureterectasis.  Bladder:  Decompressed by Foley catheter and cannot be assessed.  IMPRESSION: Renal cysts on right. No obstructing foci in either kidney. The echogenicity of the kidneys and the renal cortical thickness are felt to be within normal limits.   Electronically Signed   By: Bretta BangWilliam  Woodruff III M.D.   On: 08/20/2014 17:40   Medications: Infusions: . sodium chloride 100 mL/hr at 08/22/14 0700    Scheduled Medications: . allopurinol  100 mg Oral Daily  . aspirin EC  325 mg Oral Daily  . buPROPion  150 mg Oral Daily  . cefTRIAXone (ROCEPHIN)  IV  1 g Intravenous Q24H  . cilostazol  100 mg Oral BID  . clotrimazole   Topical BID  . fluticasone  1-2 puff Inhalation BID   . gabapentin  300 mg Oral TID  . imipramine  50 mg Oral QHS  . insulin aspart  0-9 Units Subcutaneous TID WC  . metoprolol succinate  25 mg Oral Daily  . nystatin-triamcinolone  1 application Topical BID  . pantoprazole  40 mg Oral Daily  . predniSONE  5 mg Oral BID WC  . simvastatin  10 mg Oral QHS  . sodium chloride  3 mL Intravenous Q12H  . Vitamin D (Ergocalciferol)  50,000 Units Oral Once per day on Tue Sat  . Warfarin - Pharmacist Dosing Inpatient   Does not apply q1800    have reviewed scheduled and prn medications.  Physical Exam: General: obese, pleasant Heart: RRR Lungs: mostly clear Abdomen: obese, soft Extremities: edema vs obese extremities    08/22/2014,8:13 AM  LOS: 3 days

## 2014-08-22 NOTE — Clinical Social Work Note (Signed)
CSW met with patient at bedside regarding discharge disposition. Patient informed CSW patient will be returning home once medically stable for discharge. Patient informed CSW patient currently has an aide that assist patient at home, however, patient unable to recall company name. CSW informed RNCM on information above. CSW signing off.  Lubertha Sayres, [employer] [phone]) Licensed Clinical Social Worker Orthopedics [phone]) and Surgical [phone])

## 2014-08-22 NOTE — Progress Notes (Signed)
Pt stable from ortho standpoint Splint ok tdwb for pivots and transfers only right leg Cr decreasing Follow up next Monday Will likely need snf

## 2014-08-22 NOTE — Progress Notes (Signed)
Physical Therapy Treatment Patient Details Name: Lisa [name] MRN: [id number] DOB: 01/07/1941 Today's Date: 08/22/2014    History of Present Illness 74 y.o. Female with PMH of HTN, DM, CAD that was adm to ER after fall at home. X-ray revealed Rt ankle fx, post ORIF, TTWB in short leg splint.     PT Comments    Pt. Struggled with transfers today, unable to hold weight upright and relied on +2 assist to prevent fall. Fatigued before getting all the way around into chair. Pt. Would benefit from more transfer and bed mobility training next session to increase independence before d/c to a SNF for ongoing  therapy needs.  Follow Up Recommendations  SNF;Supervision/Assistance - 24 hour     Equipment Recommendations  Hospital bed;Wheelchair (measurements PT);Wheelchair cushion (measurements PT)    Recommendations for Other Services OT consult     Precautions / Restrictions Precautions Precautions: Fall Restrictions Weight Bearing Restrictions: Yes RLE Weight Bearing: Touchdown weight bearing    Mobility  Bed Mobility Overal bed mobility: Needs Assistance Bed Mobility: Rolling;Sidelying to Sit Rolling: Supervision Sidelying to sit: Min assist       General bed mobility comments: HOB flat, min A to bring trunk upright  Transfers Overall transfer level: Needs assistance Equipment used: Rolling walker (2 wheeled) Transfers: Sit to/from Stand Sit to Stand: +2 physical assistance;Max assist Stand pivot transfers: +2 physical assistance;Max assist       General transfer comment: Fair standing quality with walker and cues needed to stand upright. Good compliance with WB status, Weightshifts but couldn't maintain for more than 15"  Ambulation/Gait                 Stairs            Wheelchair Mobility    Modified Rankin (Stroke Patients Only)       Balance Overall balance assessment: Needs assistance Sitting-balance support: No upper extremity  supported;Feet supported Sitting balance-Leahy Scale: Good     Standing balance support: During functional activity;Bilateral upper extremity supported Standing balance-Leahy Scale: Zero Standing balance comment: + 2 assist to hold up                    Cognition Arousal/Alertness: Awake/alert Behavior During Therapy: WFL for tasks assessed/performed (moves at her own speed and directs therapist) Overall Cognitive Status: Within Functional Limits for tasks assessed                      Exercises General Exercises - Lower Extremity Long Arc Quad: Seated;Both;10 reps;AAROM Straight Leg Raises: AAROM;Both;Seated;5 reps    General Comments        Pertinent Vitals/Pain Pain Assessment: No/denies pain    Home Living                      Prior Function            PT Goals (current goals can now be found in the care plan section) Progress towards PT goals: Progressing toward goals    Frequency  Min 4X/week    PT Plan Current plan remains appropriate    Co-evaluation             End of Session Equipment Utilized During Treatment: Gait belt;Oxygen Activity Tolerance: Patient limited by fatigue Patient left: in chair;with call bell/phone within reach     Time: 1055-1120 PT Time Calculation (min) (ACUTE ONLY): 25 min  Charges:  G Codes:      Perlie Mayo, SPTA 08/22/2014, 11:44 AM

## 2014-08-22 NOTE — Anesthesia Postprocedure Evaluation (Addendum)
Anesthesia Post Note  Patient: Lisa [name]  Procedure(s) Performed: Procedure(s) (LRB): OPEN REDUCTION INTERNAL FIXATION (ORIF) ANKLE FRACTURE (Right)  Anesthesia type: General  Patient location: PACU  Post pain: Pain level controlled and Adequate analgesia  Post assessment: Post-op Vital signs reviewed, Patient's Cardiovascular Status Stable, Respiratory Function Stable, Patent Airway and Pain level controlled  Last Vitals:  Filed Vitals:   08/22/14 0649  BP: 113/37  Pulse: 65  Temp: 36.8 C  Resp:     Post vital signs: Reviewed and stable  Level of consciousness: awake, alert  and oriented  Complications: No apparent anesthesia complications

## 2014-08-22 NOTE — Progress Notes (Signed)
ANTICOAGULATION CONSULT NOTE - Initial Consult  Pharmacy Consult for Coumadin Indication: VTE prophylaxis  No Known Allergies  Patient Measurements: Height: 5\' 3"  (160 cm) Weight: 267 lb 3.2 oz (121.2 kg) IBW/kg (Calculated) : 52.4  Vital Signs: Temp: 98.2 F (36.8 C) (02/15 0649) Temp Source: Oral (02/15 0649) BP: 113/37 mmHg (02/15 0649) Pulse Rate: 65 (02/15 0649)  Labs:  Recent Labs  08/20/14 0532  08/20/14 0830 08/20/14 1658 08/21/14 0544 08/21/14 0914 08/22/14 0820  HGB  --   < > 10.6*  --   --  8.5* 7.9*  HCT  --   --  33.1*  --   --  26.4* 23.6*  PLT  --   --  145*  --   --  136* 127*  LABPROT 15.5*  --   --   --  15.5*  --  17.5*  INR 1.21  --   --   --  1.21  --  1.42  CREATININE  --   < > 2.68* 2.62*  --  2.49* 2.14*  < > = values in this interval not displayed.  Estimated Creatinine Clearance: 28.7 mL/min (by C-G formula based on Cr of 2.14).   Medical History: Past Medical History  Diagnosis Date  . Diabetes mellitus without complication   . Hypertension   . CHF (congestive heart failure)   . RBBB   . Dyslipidemia   . Morbid obesity   . Diastolic dysfunction   . Coronary artery disease     s/p CABG  . Hyperlipidemia   . OSA (obstructive sleep apnea)     mild OSA with AHI 7.15 now on CPAP at 16cm H2O    Medications:  Prescriptions prior to admission  Medication Sig Dispense Refill Last Dose  . albuterol-ipratropium (COMBIVENT) 18-103 MCG/ACT inhaler Inhale 2 puffs into the lungs every 6 (six) hours as needed for wheezing or shortness of breath.    Past Month at Unknown time  . allopurinol (ZYLOPRIM) 100 MG tablet Take 100 mg by mouth daily.   08/17/2014 at Unknown time  . amLODipine-valsartan (EXFORGE) 10-320 MG per tablet Take 1 tablet by mouth daily. 30 tablet 6 08/17/2014 at Unknown time  . aspirin EC 325 MG tablet Take 1 tablet (325 mg total) by mouth daily. 30 tablet 6 Past Week at Unknown time  . buPROPion (WELLBUTRIN XL) 150 MG 24 hr  tablet Take 150 mg by mouth daily.   08/17/2014 at Unknown time  . cilostazol (PLETAL) 100 MG tablet Take 100 mg by mouth 2 (two) times daily.   08/17/2014 at Unknown time  . clotrimazole-betamethasone (LOTRISONE) lotion Apply 1 application topically 2 (two) times daily. To rash at breast and groin area as directed   Past Month at Unknown time  . ergocalciferol (VITAMIN D2) 50000 UNITS capsule Take 50,000 Units by mouth See admin instructions. Patient takes every Tuesday and Saturday   Past Week at Unknown time  . esomeprazole (NEXIUM) 40 MG capsule Take 40 mg by mouth daily before breakfast.   08/17/2014 at Unknown time  . fluticasone (FLOVENT HFA) 220 MCG/ACT inhaler Inhale 1-2 puffs into the lungs 2 (two) times daily.   08/17/2014 at Unknown time  . furosemide (LASIX) 20 MG tablet Take 1 tablet (20 mg total) by mouth every other day.   Past Week at Unknown time  . gabapentin (NEURONTIN) 300 MG capsule Take 300 mg by mouth 3 (three) times daily.   08/17/2014 at Unknown time  . imipramine (TOFRANIL) 10  MG tablet Take 50 mg by mouth at bedtime.   08/17/2014 at Unknown time  . metoprolol succinate (TOPROL XL) 25 MG 24 hr tablet Take 1 tablet (25 mg total) by mouth daily. 30 tablet 5 08/17/2014 at 7a  . nystatin-triamcinolone (MYCOLOG II) cream Apply 1 application topically 2 (two) times daily. To rash at groin and breast area as directed   Past Week at Unknown time  . predniSONE (DELTASONE) 5 MG tablet Take 5 mg by mouth 2 (two) times daily.   08/17/2014 at Unknown time  . traZODone (DESYREL) 50 MG tablet Take 50-150 mg by mouth at bedtime as needed. As needed for sleep.  May increase to three tablets if needed ( )   08/17/2014 at Unknown time  . celecoxib (CELEBREX) 200 MG capsule Take 200 mg by mouth 2 (two) times daily.   Taking  . simvastatin (ZOCOR) 10 MG tablet Take 1 tablet (10 mg total) by mouth at bedtime. 30 tablet 11 Taking   Scheduled:  . allopurinol  100 mg Oral Daily  . aspirin EC  325 mg  Oral Daily  . buPROPion  150 mg Oral Daily  . cefTRIAXone (ROCEPHIN)  IV  1 g Intravenous Q24H  . cilostazol  100 mg Oral BID  . clotrimazole   Topical BID  . fluticasone  1-2 puff Inhalation BID  . gabapentin  300 mg Oral TID  . imipramine  50 mg Oral QHS  . insulin aspart  0-9 Units Subcutaneous TID WC  . metoprolol succinate  25 mg Oral Daily  . nystatin-triamcinolone  1 application Topical BID  . pantoprazole  40 mg Oral Daily  . predniSONE  5 mg Oral BID WC  . simvastatin  10 mg Oral QHS  . sodium chloride  3 mL Intravenous Q12H  . Vitamin D (Ergocalciferol)  50,000 Units Oral Once per day on Tue Sat  . Warfarin - Pharmacist Dosing Inpatient   Does not apply q1800   Infusions:  . sodium chloride 100 mL/hr at 08/22/14 0700    Assessment: 74yo female was getting into car when her "right leg gave out and twisted" resulting in ankle fracture, now s/p ORIF, to begin Coumadin for VTE Px. INR 1.42 today  Goal of Therapy:  INR 2-3   Plan:   Coumadin  PO x1 Daily INR  Ulyses Southward, PharmD Pager: [phone] 08/22/2014 12:43 PM

## 2014-08-22 NOTE — Progress Notes (Signed)
Patient states that she will self-administer CPAP with the assistance of RN.  Patient advised to contact respiratory with and needs or concerns.  Patient is familiar with equipment and procedure.

## 2014-08-22 NOTE — Progress Notes (Signed)
[facility]                                                                              Patient Demographics  Lisa [name], is a 74 y.o. female, DOB - 04-01-1941, UXL:[id number]  Admit date - 08/18/2014   Admitting Physician Edsel PetrinMaryann Calea Hribar, DO  Outpatient Primary MD for the patient is [name],[name], MD  LOS - 3   Chief Complaint  Patient presents with  . Ankle Pain      HPI on 08/18/2014 Lisa [name] is a 74 y.o. female with a history of hypertension, diabetes, coronary artery disease that presents to the emergency department after falling. Patient states she was attempting to get into her friend's car at which point her ankle rolled and she fell. She denies feeling dizzy, shortness of breath, chest pain prior to the episode. Patient does state that she has frequent pains due to arthritis. X-ray done the ER the right ankle does show fracture. Orthopedics, Dr. Montez Moritaarter was called. [facility] was asked to admit.  Assessment & Plan   Right ankle fracture -Patient's orthopedist, Dr. Montez Moritaarter made aware of patient's admission -X-ray of the right ankle shows fracture of the medial malleolus and distal fibular diaphysis, subtle fracture posterior tibial malleolus -Continue pain control -Patient does live alone and has a aide that assists her 7 days a week for 2-3 hours per day. -Spoke with Dr. Montez Moritaarter, orthopedist, who felt that after reviewing the x-rays, patient may need intervention and spoke with Dr. August Saucerean -Patient s/p ORIF right ankle 08/19/2014 -PT consulted and recommended nursing home; although patient is relunctant -SW consulted for placement  Acute kidney injury -Creatinine improved to 2.14 (0.8 upon admission, peaked at 2.6) -Renal US: Right renal cysts, no obstruction, normal echogenicity and renal cortical thickness -Monitor intake and output; patient has had poor output- 500cc/24hrs -Continue IVF  -Avoid nephrotoxic agents -FeNA 0.3% low- likely secondary to  dehydration -Nephrology consulted and appreciated  Hyperkalemia -resolved -Was given Kayexalate, insulin/ D50amp, calcium gluconate -Will continue to monitor, likely secondary to ACEI and AKI  Urinary retention -Patient had bladder scan 2/13, found to have >400cc  -Foley catheter placed   Urinary tract infection -UA obtained: WBC11-20, large leukocytes  -Urine culture pending -Started on ceftriaxone  Essential Hypertension -Continue amlodipine, metoprolol -Will hold valsartan and Lasix   Diabetes mellitus, type II, diet controlled -Continue insulin sliding scale, CBG monitoring -Hemoglobin A1c 5.9  GERD -Continue PPI  Morbid obesity -Should be discussed with her primary care physician  History of coronary artery disease -Patient denies any chest pain at this time -Continue home medications, aspirin, beta blocker, statin  Obstructive sleep apnea -Continue sleep apnea daily at bedtime  Chronic diastolic heart failure -Echocardiogram in April 2015 shows an EF of 50-55%, normal systolic function -Will monitor daily weights, intake and output -Patient appears compensated and euvolemic at this time -Will hold Lasix  Code Status: Full  Family Communication: None at bedside  Disposition Plan: Admitted, Treating AKI, pending SNF placement  Time Spent in minutes   30 minutes  Procedures  Right Ankle ORIF Renal US  Consults   Orthopedics Nephrology  DVT Prophylaxis  Coumadin  Lab Results  Component Value Date   PLT 127* 08/22/2014    Medications  Scheduled Meds: . allopurinol  100 mg Oral Daily  . aspirin EC  325 mg Oral Daily  . buPROPion  150 mg Oral Daily  . cefTRIAXone (ROCEPHIN)  IV  1 g Intravenous Q24H  . cilostazol  100 mg Oral BID  . clotrimazole   Topical BID  . fluticasone  1-2 puff Inhalation BID  . gabapentin  300 mg Oral TID  . imipramine  50 mg Oral QHS  . insulin aspart  0-9 Units Subcutaneous TID WC  . metoprolol succinate  25 mg  Oral Daily  . nystatin-triamcinolone  1 application Topical BID  . pantoprazole  40 mg Oral Daily  . predniSONE  5 mg Oral BID WC  . simvastatin  10 mg Oral QHS  . sodium chloride  3 mL Intravenous Q12H  . Vitamin D (Ergocalciferol)  50,000 Units Oral Once per day on Tue Sat  . Warfarin - Pharmacist Dosing Inpatient   Does not apply q1800   Continuous Infusions: . sodium chloride 100 mL/hr at 08/22/14 0700   PRN Meds:.sodium chloride, acetaminophen **OR** acetaminophen, HYDROcodone-acetaminophen, HYDROmorphone (DILAUDID) injection, ipratropium-albuterol, methocarbamol **OR** methocarbamol (ROBAXIN)  IV, metoCLOPramide **OR** metoCLOPramide (REGLAN) injection, morphine injection, morphine injection, ondansetron (ZOFRAN) IV, ondansetron **OR** ondansetron (ZOFRAN) IV, phenol, sodium chloride, traZODone  Antibiotics    Anti-infectives    Start     Dose/Rate Route Frequency Ordered Stop   08/21/14 0730  cefTRIAXone (ROCEPHIN) 1 g in dextrose 5 % 50 mL IVPB - Premix     1 g 100 mL/hr over 30 Minutes Intravenous Every 24 hours 08/21/14 0658     08/20/14 0341  ceFAZolin (ANCEF) IVPB 2 g/50 mL premix     2 g 100 mL/hr over 30 Minutes Intravenous 3 times per day 08/20/14 0341 08/20/14 1920        Subjective:   Lisa Hews seen and examined today. Patient is still reluctant regarding nursing home placement. Denies any chest pain, shortness of breath, abdominal pain.   Objective:   Filed Vitals:   08/20/14 2035 08/21/14 1500 08/21/14 2121 08/22/14 0649  BP: 116/44 109/53 113/34 113/37  Pulse:  84 64 65  Temp: 98.1 F (36.7 C) 97.8 F (36.6 C) 98.2 F (36.8 C) 98.2 F (36.8 C)  TempSrc: Oral Oral Oral Oral  Resp: Height:      Weight:    121.2 kg (267 lb 3.2 oz)  SpO2: 93% 94% 96% 96%    Wt Readings from Last 3 Encounters:  08/22/14 121.2 kg (267 lb 3.2 oz)  03/22/14 110.224 kg (243 lb)  09/30/13 113.853 kg (251 lb)     Intake/Output Summary (Last 24 hours)  at 08/22/14 1036 Last data filed at 08/22/14 0700  Gross per 24 hour  Intake   2778 ml  Output    800 ml  Net   1978 ml    Exam  General: Well developed, NAD  HEENT: NCAT, mucous membranes moist.   Cardiovascular: S1 S2 auscultated, RRR  Respiratory: Clear to auscultation bilaterally with equal chest rise  Abdomen: Soft, obese, nontender, nondistended, + bowel sounds  Extremities: RLE wrapped.  No edema, cyanosis, clubbing of LLE.   Data Review   Micro Results Recent Results (from the past 240 hour(s))  Surgical pcr screen     Status: None   Collection Time: 08/19/14  7:19 PM  Result Value Ref Range Status   MRSA,  PCR NEGATIVE NEGATIVE Final   Staphylococcus aureus NEGATIVE NEGATIVE Final    Comment:        The Xpert SA Assay (FDA approved for NASAL specimens in patients over 42 years of age), is one component of a comprehensive surveillance program.  Test performance has been validated by [facility] for patients greater than or equal to 42 year old. It is not intended to diagnose infection nor to guide or monitor treatment.   Urine culture     Status: None (Preliminary result)   Collection Time: 08/20/14 11:30 AM  Result Value Ref Range Status   Specimen Description URINE, CATHETERIZED  Final   Special Requests NONE  Final   Colony Count PENDING  Incomplete   Culture   Final    Culture reincubated for better growth Performed at [facility]    Report Status PENDING  Incomplete    Radiology Reports Dg Ankle 2 Views Right  08/20/2014   CLINICAL DATA:  74 year old female ORIF right ankle fracture  EXAM: DG C-ARM 61-120 MIN; RIGHT ANKLE - 2 VIEW  TECHNIQUE: Two intraoperative fluoroscopic views  FLUOROSCOPY TIME:  Fluoroscopy Time (in minutes and seconds): Not specified  COMPARISON:  08/18/2013  FINDINGS: ORIF hardware at the distal fibula and medial malleolus. Near anatomic alignment. Adverse features.  IMPRESSION: ORIF right ankle, no adverse  features.   Electronically Signed   By: Odessa Fleming M.D.   On: 08/20/2014 00:19   Dg Ankle Complete Right  08/18/2014   CLINICAL DATA:  Pain and swelling after fall  EXAM: RIGHT ANKLE - COMPLETE 3+ VIEW  COMPARISON:  None.  FINDINGS: Frontal, oblique, and lateral views were obtained. There is a fracture of the distal fibular diaphysis with posterior and lateral displacement of the distal fracture fragment with respect proximal fragment. There is an obliquely oriented fracture of the medial malleolus which is mildly displaced distally. There is a subtle fracture of the posterior tibial malleolus in essentially anatomic alignment. The ankle mortise appears grossly intact. There are spurs arising from the calcaneus. There are foci of vascular calcification.  IMPRESSION: Fracture of the medial malleolus and distal fibular diaphysis. Subtle fracture posterior tibial malleolus. Ankle mortise grossly intact. Extensive arterial vascular calcification. Calcaneal spurs present.   Electronically Signed   By: Bretta Bang III M.D.   On: 08/18/2014 14:12   US Renal  08/20/2014   CLINICAL DATA:  Elevated serum creatinine  EXAM: RENAL ULTRASOUND  COMPARISON:  CT abdomen and pelvis August 19, 2011  FINDINGS: Right Kidney:  Length: 10.1 cm. Echogenicity and renal cortical thickness are within normal limits. No perinephric fluid or hydronephrosis visualized. There is a 5.1 x 4.2 x 4.3 cm cyst in the mid right kidney. A second cyst in the upper pole region measures 2.0 x 1.9 x 2.3 cm. No sonographically demonstrable calculus or ureterectasis.  Left Kidney:  Length: 10.5 cm. Echogenicity within normal limits. No mass, perinephric fluid, or hydronephrosis visualized. No sonographically demonstrable calculus or ureterectasis.  Bladder:  Decompressed by Foley catheter and cannot be assessed.  IMPRESSION: Renal cysts on right. No obstructing foci in either kidney. The echogenicity of the kidneys and the renal cortical thickness  are felt to be within normal limits.   Electronically Signed   By: Bretta Bang III M.D.   On: 08/20/2014 17:40   Dg Knee Complete 4 Views Right  08/18/2014   CLINICAL DATA:  Pain and swelling after fall  EXAM: RIGHT KNEE - COMPLETE 4+ VIEW  COMPARISON:  May 13, 2013  FINDINGS: Frontal, lateral, and bilateral oblique views were obtained. 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 There is evidence of an old fracture along the superior aspect of the patella with fragmentation in this area. The appearance in this area is stable. It should be noted that subtle re-injury in this area could be easily obscured given the prior injury. No acute fracture is seen. No dislocation. There is a joint effusion currently.  There is extensive arterial vascular calcification.  IMPRESSION: Moderate joint effusion. Evidence of old trauma to the superior patella compared to the previous study. Re-injury in this area cannot be excluded, however. No other evidence of fracture. No dislocation.   Electronically Signed   By: Bretta Bang III M.D.   On: 08/18/2014 14:16   Dg Ankle Right Port  08/20/2014   CLINICAL DATA:  74 year old female status post ORIF. Initial encounter.  EXAM: PORTABLE RIGHT ANKLE - 2 VIEW  COMPARISON:  08/19/2014 and earlier  FINDINGS: Portable AP and cross-table lateral views. Splint material degrades bone detail. ORIF distal fibula and medial malleolus. Near anatomic alignment. No adverse features.  IMPRESSION: No adverse features status post right ankle ORIF.   Electronically Signed   By: Odessa Fleming M.D.   On: 08/20/2014 00:34   Dg C-arm 1-60 Min  08/20/2014   CLINICAL DATA:  74 year old female ORIF right ankle fracture  EXAM: DG C-ARM 61-120 MIN; RIGHT ANKLE - 2 VIEW  TECHNIQUE: Two intraoperative fluoroscopic views  FLUOROSCOPY TIME:  Fluoroscopy Time (in minutes and seconds): Not specified  COMPARISON:  08/18/2013  FINDINGS: ORIF  hardware at the distal fibula and medial malleolus. Near anatomic alignment. Adverse features.  IMPRESSION: ORIF right ankle, no adverse features.   Electronically Signed   By: Odessa Fleming M.D.   On: 08/20/2014 00:19    CBC  Recent Labs Lab 08/18/14 1843 08/19/14 0632 08/20/14 0830 08/21/14 0914 08/22/14 0820  WBC 7.8 6.3 9.1 10.7* 8.3  HGB 10.5* 10.5* 10.6* 8.5* 7.9*  HCT 31.8* 32.1* 33.1* 26.4* 23.6*  PLT 160 162 145* 136* 127*  MCV 94.4 96.7 96.8 96.4 94.0  MCH 31.2 31.6 31.0 31.0 31.5  MCHC 33.0 32.7 32.0 32.2 33.5  RDW 13.4 13.5 13.5 13.4 13.1    Chemistries   Recent Labs Lab 08/18/14 1843 08/19/14 0632 08/19/14 1210 08/20/14 0830 08/20/14 1658 08/21/14 0914 08/22/14 0820  NA 138 138  --  136 135 133* 131*  K 4.4 5.2* 5.4* 5.6* 5.0 5.1 4.5  CL 106 107  --  103 104 102 103  CO2 26 25  --  22 21 22 24   GLUCOSE 88 171*  --  190* 80 156* 118*  BUN 11 15  --  25* 29* 35* 43*  CREATININE 0.86 1.34*  --  2.68* 2.62* 2.49* 2.14*  CALCIUM 8.9 8.5  --  8.1* 8.2* 8.2* 7.6*  AST 23  --   --   --   --   --   --   ALT 15  --   --   --   --   --   --   ALKPHOS 57  --   --   --   --   --   --   BILITOT 0.8  --   --   --   --   --   --    ------------------------------------------------------------------------------------------------------------------ estimated creatinine clearance is 28.7 mL/min (by C-G formula  based on Cr of 2.14). ------------------------------------------------------------------------------------------------------------------ No results for input(s): HGBA1C in the last 72 hours. ------------------------------------------------------------------------------------------------------------------ No results for input(s): CHOL, HDL, LDLCALC, TRIG, CHOLHDL, LDLDIRECT in the last 72 hours. ------------------------------------------------------------------------------------------------------------------ No results for input(s): TSH, T4TOTAL, T3FREE, THYROIDAB in the  last 72 hours.  Invalid input(s): FREET3 ------------------------------------------------------------------------------------------------------------------ No results for input(s): VITAMINB12, FOLATE, FERRITIN, TIBC, IRON, RETICCTPCT in the last 72 hours.  Coagulation profile  Recent Labs Lab 08/20/14 0532 08/21/14 0544 08/22/14 0820  INR 1.21 1.21 1.42    No results for input(s): DDIMER in the last 72 hours.  Cardiac Enzymes No results for input(s): CKMB, TROPONINI, MYOGLOBIN in the last 168 hours.  Invalid input(s): CK ------------------------------------------------------------------------------------------------------------------ Invalid input(s): POCBNP    Maloni Musleh D.O. on 08/22/2014 at 10:36 AM  Between 7am to 7pm - Pager - [phone]  After 7pm go to [email or web address] - password [email or web address]  And look for the night coverage person covering for me after hours  [facility] Office  [phone]

## 2014-08-23 LAB — PROTIME-INR
INR: 2.65 — ABNORMAL HIGH (ref 0.00–1.49)
Prothrombin Time: 28.5 seconds — ABNORMAL HIGH (ref 11.6–15.2)

## 2014-08-23 LAB — CBC
HCT: 25.4 % — ABNORMAL LOW (ref 36.0–46.0)
HEMOGLOBIN: 8.3 g/dL — AB (ref 12.0–15.0)
MCH: 31.7 pg (ref 26.0–34.0)
MCHC: 32.7 g/dL (ref 30.0–36.0)
MCV: 96.9 fL (ref 78.0–100.0)
Platelets: 133 10*3/uL — ABNORMAL LOW (ref 150–400)
RBC: 2.62 MIL/uL — AB (ref 3.87–5.11)
RDW: 13.4 % (ref 11.5–15.5)
WBC: 6.7 10*3/uL (ref 4.0–10.5)

## 2014-08-23 LAB — RENAL FUNCTION PANEL
Albumin: 2.4 g/dL — ABNORMAL LOW (ref 3.5–5.2)
Anion gap: 10 (ref 5–15)
BUN: 43 mg/dL — ABNORMAL HIGH (ref 6–23)
CALCIUM: 8.1 mg/dL — AB (ref 8.4–10.5)
CO2: 17 mmol/L — ABNORMAL LOW (ref 19–32)
CREATININE: 1.81 mg/dL — AB (ref 0.50–1.10)
Chloride: 105 mmol/L (ref 96–112)
GFR calc Af Amer: 30 mL/min — ABNORMAL LOW (ref >90)
GFR calc non Af Amer: 26 mL/min — ABNORMAL LOW (ref >90)
Glucose, Bld: 113 mg/dL — ABNORMAL HIGH (ref 70–99)
Phosphorus: 4.8 mg/dL — ABNORMAL HIGH (ref 2.3–4.6)
Potassium: 5.1 mmol/L (ref 3.5–5.1)
SODIUM: 132 mmol/L — AB (ref 135–145)

## 2014-08-23 LAB — GLUCOSE, CAPILLARY
Glucose-Capillary: 105 mg/dL — ABNORMAL HIGH (ref 70–99)
Glucose-Capillary: 114 mg/dL — ABNORMAL HIGH (ref 70–99)
Glucose-Capillary: 106 mg/dL — ABNORMAL HIGH (ref 70–99)

## 2014-08-23 MED ORDER — WARFARIN SODIUM 2.5 MG PO TABS: 2.5000 mg | ORAL_TABLET | Freq: Every day | ORAL | Status: DC

## 2014-08-23 MED ORDER — HYDROCODONE-ACETAMINOPHEN 5-325 MG PO TABS: 1.0000 | ORAL_TABLET | ORAL | Status: DC | PRN

## 2014-08-23 NOTE — Clinical Social Work Note (Signed)
CSW has arranged for EMS ([facility]).   Lisa [name], LCSWA [phone]) Licensed Clinical Social Worker Orthopedics [phone]([id number]) and Surgical [phone]([id number])

## 2014-08-23 NOTE — Progress Notes (Signed)
Subjective:  UOP adequate- creatinine  improved- pt feels well  Objective Vital signs in last 24 hours: Filed Vitals:   08/22/14 2039 08/22/14 2150 08/23/14 0600 08/23/14 0747  BP:  113/38 123/38   Pulse: 57 61 62   Temp:  97.5 F (36.4 C) 98.3 F (36.8 C)   TempSrc:      Resp:  16 16   Height:      Weight:   122.2 kg (269 lb 6.4 oz)   SpO2: 94% 100% 100% 100%   Weight change: 1 kg (2 lb 3.3 oz)  Intake/Output Summary (Last 24 hours) at 08/23/14 0848 Last data filed at 08/23/14 0500  Gross per 24 hour  Intake      0 ml  Output   1600 ml  Net  -1600 ml    Assessment/ Plan: Pt is a 74 y.o. yo female who was admitted on 08/18/2014 with ankle fracture req surgical management.  She had AKI post op (crt 0.86 at baseline) peaked at 2.6- now 1.8 Assessment/Plan: 1. AKI- thought due to hypotension/ARB/Cox 2 inhib- now all offending meds stopped.  Nonoliguric.  Creatinine trending down nicely.  Question of UTI- Urine culture is pending- >100, col gram positive cocci-  started on rocephin.  2. Anemia- due to surgery- follow - improving 3. HTN/volume- BP lowish- only on low dose beta blocker- no need to restart ARB at this time and would not restart it  Anticipate continued recovery- renal will sign off call with questions  Bretton Tandy A    Labs: Basic Metabolic Panel:  Recent Labs Lab 08/21/14 0914 08/22/14 0820 08/23/14 0615  NA 133* 131* 132*  K 5.1 4.5 5.1  CL 102 103 105  CO2 22 24 17*  GLUCOSE 156* 118* 113*  BUN 35* 43* 43*  CREATININE 2.49* 2.14* 1.81*  CALCIUM 8.2* 7.6* 8.1*  PHOS  --   --  4.8*   Liver Function Tests:  Recent Labs Lab 08/18/14 1843 08/23/14 0615  AST 23  --   ALT 15  --   ALKPHOS 57  --   BILITOT 0.8  --   PROT 6.1  --   ALBUMIN 2.9* 2.4*   No results for input(s): LIPASE, AMYLASE in the last 168 hours. No results for input(s): AMMONIA in the last 168 hours. CBC:  Recent Labs Lab 08/19/14 0632 08/20/14 0830  08/21/14 0914 08/22/14 0820 08/23/14 0615  WBC 6.3 9.1 10.7* 8.3 6.7  HGB 10.5* 10.6* 8.5* 7.9* 8.3*  HCT 32.1* 33.1* 26.4* 23.6* 25.4*  MCV 96.7 96.8 96.4 94.0 96.9  PLT 162 145* 136* 127* 133*   Cardiac Enzymes: No results for input(s): CKTOTAL, CKMB, CKMBINDEX, TROPONINI in the last 168 hours. CBG:  Recent Labs Lab 08/22/14 0708 08/22/14 1148 08/22/14 1638 08/22/14 2115 08/23/14 0641  GLUCAP 157* 121* 122* 128* 105*    Iron Studies: No results for input(s): IRON, TIBC, TRANSFERRIN, FERRITIN in the last 72 hours. Studies/Results: No results found. Medications: Infusions: . sodium chloride 100 mL/hr at 08/22/14 1301    Scheduled Medications: . allopurinol  100 mg Oral Daily  . aspirin EC  325 mg Oral Daily  . buPROPion  150 mg Oral Daily  . cefTRIAXone (ROCEPHIN)  IV  1 g Intravenous Q24H  . cilostazol  100 mg Oral BID  . clotrimazole   Topical BID  . fluticasone  1-2 puff Inhalation BID  . gabapentin  300 mg Oral BID  . imipramine  50 mg Oral QHS  .  insulin aspart  0-9 Units Subcutaneous TID WC  . metoprolol succinate  25 mg Oral Daily  . nystatin-triamcinolone  1 application Topical BID  . pantoprazole  40 mg Oral Daily  . predniSONE  5 mg Oral BID WC  . simvastatin  10 mg Oral QHS  . sodium chloride  3 mL Intravenous Q12H  . Vitamin D (Ergocalciferol)  50,000 Units Oral Once per day on Tue Sat  . Warfarin - Pharmacist Dosing Inpatient   Does not apply q1800    have reviewed scheduled and prn medications.  Physical Exam: General: obese, pleasant Heart: RRR Lungs: mostly clear Abdomen: obese, soft Extremities: edema vs obese extremities    08/23/2014,8:48 AM  LOS: 4 days

## 2014-08-23 NOTE — Progress Notes (Signed)
Physical Therapy Treatment Patient Details Name: Lisa [name] MRN: [id number] DOB: 1940/11/18 Today's Date: 08/23/2014    History of Present Illness 74 y.o. Female with PMH of HTN, DM, CAD that was adm to ER after fall at home. X-ray revealed Rt ankle fx, post ORIF, TTWB in short leg splint.     PT Comments    Pt with increased difficulty performing sit to stand transfers today with +2 assist and ultimately required used of Stedy lift for transfer OOB safely. Pt will continue to benefit from functional sit to stands for strengthening and transfer training using Stedy for partial sit to stands to aid with overall mobility in preparation for d/c to SNF for further rehab. Cues needed for WB status throughout session.    Follow Up Recommendations  SNF;Supervision/Assistance - 24 hour     Equipment Recommendations  Hospital bed;Wheelchair (measurements PT);Wheelchair cushion (measurements PT)    Recommendations for Other Services       Precautions / Restrictions Precautions Precautions: Fall Restrictions Weight Bearing Restrictions: Yes RLE Weight Bearing: Touchdown weight bearing    Mobility  Bed Mobility Overal bed mobility: Needs Assistance Bed Mobility: Supine to Sit     Supine to sit: Supervision;HOB elevated     General bed mobility comments: Pt using rail on R to assist with trunk at S level  Transfers Overall transfer level: Needs assistance Equipment used: Rolling walker (2 wheeled) (Stedy lift) Transfers: Sit to/from Stand Sit to Stand: +2 physical assistance;From elevated surface         General transfer comment: Multiple attempts with sit to stand using RW from bed and then from elevated bed height and pt with increased difficulty getting up into standing and unable to maintain. After multple trials, used Stedy lift to perform transfer from bed to recliner with max A for sit to stand. Cues for hand placement and technique throughout, though pt eager to  direct where she wants the therapists.  Recommend continued practice with stedy lift for transfers for safety until sit to stands more consistent and pt able to tolerate standing position with decreased physical A and longer standing endurance. Cues for TDWB status to RLE.  Ambulation/Gait             General Gait Details: unsafe to attempt at this time due to poor standing tolerance and ability for sit to stand   Stairs            Wheelchair Mobility    Modified Rankin (Stroke Patients Only)       Balance Overall balance assessment: Needs assistance Sitting-balance support: Single extremity supported;No upper extremity supported;Feet supported Sitting balance-Leahy Scale: Good       Standing balance-Leahy Scale: Zero Standing balance comment: requires +2 assist or use of Stedy lift                    Cognition Arousal/Alertness: Awake/alert Behavior During Therapy: WFL for tasks assessed/performed Overall Cognitive Status: Within Functional Limits for tasks assessed                      Exercises General Exercises - Lower Extremity Long Arc Quad: AROM;Strengthening;Both;20 reps;Seated    General Comments General comments (skin integrity, edema, etc.): After last trial of sit to stand with RW, pt near EOB and unable to scoot back. Required third person to assist from behind to pull pt back onto bed to prevent from sliding off.       Pertinent  Vitals/Pain Pain Assessment: 0-10 Pain Score: 3  ("It's not too bad") Pain Location: R ankle Pain Descriptors / Indicators: Aching Pain Intervention(s): Monitored during session;Repositioned    Home Living                      Prior Function            PT Goals (current goals can now be found in the care plan section) Acute Rehab PT Goals Patient Stated Goal: to get better then go home PT Goal Formulation: With patient Time For Goal Achievement: 08/26/14 Potential to Achieve Goals:  Fair Progress towards PT goals: Progressing toward goals    Frequency  Min 4X/week    PT Plan Current plan remains appropriate    Co-evaluation             End of Session Equipment Utilized During Treatment: Gait belt;Other (comment);Oxygen Activity Tolerance: Patient tolerated treatment well Patient left: in chair;with call bell/phone within reach     Time: 0835-0913 PT Time Calculation (min) (ACUTE ONLY): 38 min  Charges:  $Therapeutic Exercise: 8-22 mins $Therapeutic Activity: 23-37 mins                    G Codes:      Tedd Sias 08/23/2014, 9:42 AM

## 2014-08-23 NOTE — Progress Notes (Signed)
08/23/14 Spoke with patient about d/c plan. She stated that she does not want to go to a SNF and she didn't want to discuss it again. She selected [facility] for HHC. Patient stated that she would like a hospital bed. I contacted Homero FellersFrank with [facility], hospital bed would not be covered for her dx. I explained that to the patient.Patient stated that she has a rolling walker and 3N1 at home. Contacted Frank with [facility] and requested a wheelchair, it was delivered to patient's room. Patient contacted a family member to come pick up her wheelchair since she decided to go home via ambulance. Contacted Miranda at [facility] nad set up HHPT,OT, Charity fundraiserN and Child psychotherapistsocial worker. Patient states that she will have family and aides available to assist her at home. Patient aware that SNF is the recommended discharge plan.      08/20/14 1430 Lisa CrutchJeannette Oliveras, Lisa [name], Lisa [name]  PT is recommending SNF/24 hr supervision/assistance. Pt lives alone. Discussed PT recommendations. She doesn't want to go a SNF. She stated that she has an aide that comes in every day. She has a friend that can assist her 24 hr, but she is not able to pay her. She has family, but they are not able to provide 24 hr care. She is going to talk to her family to see how they can help. Informed pt that it won't be safe for her to return home alone. Explained SW referral and she agreed. Will continue to f/u.

## 2014-08-23 NOTE — Discharge Summary (Signed)
Physician Discharge Summary  Lisa [name]:[id number] DOB: 30-Jun-1941 DOA: 08/18/2014  PCP: Laurena Slimmer, MD  Admit date: 08/18/2014 Discharge date: 08/23/2014  Time spent: 45 minutes  Recommendations for Outpatient Follow-up:  Patient will be discharged to home with home health services. Patient refused nursing home placement. Patient understands the consequences of this. Patient should follow with her primary care physician within one week of discharge. She will also need follow-up with Dr. August Saucer in 1 week of discharge. Patient to continue her medications as prescribed. Patient to follow a heart healthy/carb modified diet. Patient will need to have repeat CBC, BMP, and INR check within 3 days of discharge.   Discharge Diagnoses:  Right ankle fracture Acute kidney injury Hyperkalemia Urinary retention Urinary tract infection Essential hypertension Diabetes mellitus, type II, diet controlled GERD Morbid obesity History of coronary artery disease Obstructive sleep apnea Chronic diastolic heart failure  Discharge Condition: Stable  Diet recommendation: Heart healthy/carb modified  Filed Weights   08/20/14 1100 08/22/14 0649 08/23/14 0600  Weight: 112.855 kg (248 lb 12.8 oz) 121.2 kg (267 lb 3.2 oz) 122.2 kg (269 lb 6.4 oz)    History of present illness:  on 08/18/2014 Lisa [name] is a 74 y.o. female with a history of hypertension, diabetes, coronary artery disease that presents to the emergency department after falling. Patient states she was attempting to get into her friend's car at which point her ankle rolled and she fell. She denies feeling dizzy, shortness of breath, chest pain prior to the episode. Patient does state that she has frequent pains due to arthritis. X-ray done the ER the right ankle does show fracture. Orthopedics, Dr. Montez Morita was called. [facility] was asked to admit.  Hospital Course:  Right ankle fracture -Patient's orthopedist, Dr. Montez Morita made aware  of patient's admission -X-ray of the right ankle shows fracture of the medial malleolus and distal fibular diaphysis, subtle fracture posterior tibial malleolus -Continue pain control -Patient does live alone and has a aide that assists her 7 days a week for 2-3 hours per day. -Spoke with Dr. Montez Morita, orthopedist, who felt that after reviewing the x-rays, patient may need intervention and spoke with Dr. August Saucer -Patient s/p ORIF right ankle 08/19/2014 -PT consulted and recommended nursing home; although patient is reluctant and has opted to go home.  She understands that this may not be the best decision  -Patient will be discharged with home health -Patient placed on Coumadin for DVT prophylaxis  Acute kidney injury -Creatinine improved to 1.8 (0.8 upon admission, peaked at 2.6) -Renal US: Right renal cysts, no obstruction, normal echogenicity and renal cortical thickness -Monitor intake and output; patient has had poor output- 1600cc/24hrs -Has responded well to IVF -Avoid nephrotoxic agents -FeNA 0.3% low- likely secondary to dehydration -Nephrology consulted and appreciated and signed off -Patient will need to follow-up for repeat BMP  Hyperkalemia -resolved -Was given Kayexalate, insulin/ D50amp, calcium gluconate -Will continue to monitor, likely secondary to ACEI and AKI  Urinary retention -Patient had bladder scan 2/13, found to have >400cc  -Foley catheter placed- but will be discontinued  Urinary tract infection -UA obtained: WBC11-20, large leukocytes  -Urine culture: GPC -Patient received antibiotic course while hospitalized -Afebrile, no leukocytosis  Essential Hypertension -Continue amlodipine, metoprolol -Valsartan should be held indefinitely  -Lasix should be held until seen by her PCP  Diabetes mellitus, type II, diet controlled -Placed on  insulin sliding scale, CBG monitoring during hospitalization -Hemoglobin A1c 5.9  GERD -Continue PPI  Morbid  obesity -  Should be discussed with her primary care physician  History of coronary artery disease -Patient denies any chest pain at this time -Continue home medications, aspirin, beta blocker, statin  Obstructive sleep apnea -Continue CPAP daily at bedtime  Chronic diastolic heart failure -Echocardiogram in April 2015 shows an EF of 50-55%, normal systolic function -Daily weights, intake and output were monitored -Patient's urinary output did increase to 1600 mL over the last 24 hours -Patient appears compensated and euvolemic at this time -Lasix currently held due to patient's acute kidney injury-would continue to hold until seen by her primary care physician  Procedures  Right Ankle ORIF Renal US  Consults  Orthopedics Nephrology  Discharge Exam: Filed Vitals:   08/23/14 0600  BP: 123/38  Pulse: 62  Temp: 98.3 F (36.8 C)  Resp: 16   Exam  General: Well developed, NAD  HEENT: NCAT, mucous membranes moist.   Cardiovascular: S1 S2 auscultated, RRR  Respiratory: Clear to auscultation bilaterally with equal chest rise  Abdomen: Soft, obese, nontender, nondistended, + bowel sounds  Extremities: RLE wrapped. No edema, cyanosis, clubbing of LLE.  Discharge Instructions      Discharge Instructions    Discharge instructions    Complete by:  As directed   Patient will be discharged to home with home health services. Patient refused nursing home placement. Patient understands the consequences of this. Patient should follow with her primary care physician within one week of discharge. She will also need follow-up with Dr. August Saucer in 1 week of discharge. Patient to continue her medications as prescribed. Patient to follow a heart healthy/carb modified diet. Patient will need to have repeat CBC, BMP, and INR check within 3 days of discharge.            Medication List    STOP taking these medications        amLODipine-valsartan 10-320 MG per tablet  Commonly  known as:  EXFORGE     celecoxib 200 MG capsule  Commonly known as:  CELEBREX     furosemide 20 MG tablet  Commonly known as:  LASIX      TAKE these medications        allopurinol 100 MG tablet  Commonly known as:  ZYLOPRIM  Take 100 mg by mouth daily.     aspirin EC 325 MG tablet  Take 1 tablet (325 mg total) by mouth daily.     buPROPion 150 MG 24 hr tablet  Commonly known as:  WELLBUTRIN XL  Take 150 mg by mouth daily.     cilostazol 100 MG tablet  Commonly known as:  PLETAL  Take 100 mg by mouth 2 (two) times daily.     clotrimazole-betamethasone lotion  Commonly known as:  LOTRISONE  Apply 1 application topically 2 (two) times daily. To rash at breast and groin area as directed     COMBIVENT 18-103 MCG/ACT inhaler  Generic drug:  albuterol-ipratropium  Inhale 2 puffs into the lungs every 6 (six) hours as needed for wheezing or shortness of breath.     ergocalciferol 50000 UNITS capsule  Commonly known as:  VITAMIN D2  Take 50,000 Units by mouth See admin instructions. Patient takes every Tuesday and Saturday     esomeprazole 40 MG capsule  Commonly known as:  NEXIUM  Take 40 mg by mouth daily before breakfast.     fluticasone 220 MCG/ACT inhaler  Commonly known as:  FLOVENT HFA  Inhale 1-2 puffs into the lungs 2 (two) times daily.  gabapentin 300 MG capsule  Commonly known as:  NEURONTIN  Take 300 mg by mouth 3 (three) times daily.     HYDROcodone-acetaminophen 5-325 MG per tablet  Commonly known as:  NORCO  Take 1-2 tablets by mouth every 6 (six) hours as needed.     HYDROcodone-acetaminophen 5-325 MG per tablet  Commonly known as:  NORCO/VICODIN  Take 1-2 tablets by mouth every 4 (four) hours as needed for moderate pain.     imipramine 10 MG tablet  Commonly known as:  TOFRANIL  Take 50 mg by mouth at bedtime.     metoprolol succinate 25 MG 24 hr tablet  Commonly known as:  TOPROL XL  Take 1 tablet (25 mg total) by mouth daily.      nystatin-triamcinolone cream  Commonly known as:  MYCOLOG II  Apply 1 application topically 2 (two) times daily. To rash at groin and breast area as directed     predniSONE 5 MG tablet  Commonly known as:  DELTASONE  Take 5 mg by mouth 2 (two) times daily.     simvastatin 10 MG tablet  Commonly known as:  ZOCOR  Take 1 tablet (10 mg total) by mouth at bedtime.     traZODone 50 MG tablet  Commonly known as:  DESYREL  Take 50-150 mg by mouth at bedtime as needed. As needed for sleep.  May increase to three tablets if needed (150mg )     warfarin 2.5 MG tablet  Commonly known as:  COUMADIN  Take 1 tablet (2.5 mg total) by mouth daily.  Start taking on:  08/24/2014       No Known Allergies Follow-up Information    Follow up with Laurena Slimmer, MD. Schedule an appointment as soon as possible for a visit in 1 week.   Specialty:  Internal Medicine   Why:  Hospital followup, repeat CBC, BMP, INR (3 days)   Contact information:   [address] [place] [address] [phone]       Follow up with Cammy Copa, MD. Schedule an appointment as soon as possible for a visit in 1 week.   Specialty:  Orthopedic Surgery   Why:  Hospital followup   Contact information:   [address] [place] [address] [phone]        The results of significant diagnostics from this hospitalization (including imaging, microbiology, ancillary and laboratory) are listed below for reference.    Significant Diagnostic Studies: Dg Ankle 2 Views Right  2014/09/03   CLINICAL DATA:  74 year old female ORIF right ankle fracture  EXAM: DG C-ARM 61-120 MIN; RIGHT ANKLE - 2 VIEW  TECHNIQUE: Two intraoperative fluoroscopic views  FLUOROSCOPY TIME:  Fluoroscopy Time (in minutes and seconds): Not specified  COMPARISON:  08/18/2013  FINDINGS: ORIF hardware at the distal fibula and medial malleolus. Near anatomic alignment. Adverse features.  IMPRESSION: ORIF right ankle, no  adverse features.   Electronically Signed   By: Odessa Fleming M.D.   On: 09-03-14 00:19   Dg Ankle Complete Right  08/18/2014   CLINICAL DATA:  Pain and swelling after fall  EXAM: RIGHT ANKLE - COMPLETE 3+ VIEW  COMPARISON:  None.  FINDINGS: Frontal, oblique, and lateral views were obtained. There is a fracture of the distal fibular diaphysis with posterior and lateral displacement of the distal fracture fragment with respect proximal fragment. There is an obliquely oriented fracture of the medial malleolus which is mildly displaced distally. There is a subtle fracture of the  posterior tibial malleolus in essentially anatomic alignment. The ankle mortise appears grossly intact. There are spurs arising from the calcaneus. There are foci of vascular calcification.  IMPRESSION: Fracture of the medial malleolus and distal fibular diaphysis. Subtle fracture posterior tibial malleolus. Ankle mortise grossly intact. Extensive arterial vascular calcification. Calcaneal spurs present.   Electronically Signed   By: Bretta Bang III M.D.   On: 08/18/2014 14:12   US Renal  08/20/2014   CLINICAL DATA:  Elevated serum creatinine  EXAM: RENAL ULTRASOUND  COMPARISON:  CT abdomen and pelvis August 19, 2011  FINDINGS: Right Kidney:  Length: 10.1 cm. Echogenicity and renal cortical thickness are within normal limits. No perinephric fluid or hydronephrosis visualized. There is a 5.1 x 4.2 x 4.3 cm cyst in the mid right kidney. A second cyst in the upper pole region measures 2.0 x 1.9 x 2.3 cm. No sonographically demonstrable calculus or ureterectasis.  Left Kidney:  Length: 10.5 cm. Echogenicity within normal limits. No mass, perinephric fluid, or hydronephrosis visualized. No sonographically demonstrable calculus or ureterectasis.  Bladder:  Decompressed by Foley catheter and cannot be assessed.  IMPRESSION: Renal cysts on right. No obstructing foci in either kidney. The echogenicity of the kidneys and the renal cortical  thickness are felt to be within normal limits.   Electronically Signed   By: Bretta Bang III M.D.   On: 08/20/2014 17:40   Dg Knee Complete 4 Views Right  08/18/2014   CLINICAL DATA:  Pain and swelling after fall  EXAM: RIGHT KNEE - COMPLETE 4+ VIEW  COMPARISON:  May 13, 2013  FINDINGS: Frontal, lateral, and bilateral oblique views were obtained. 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 There is evidence of an old fracture along the superior aspect of the patella with fragmentation in this area. The appearance in this area is stable. It should be noted that subtle re-injury in this area could be easily obscured given the prior injury. No acute fracture is seen. No dislocation. There is a joint effusion currently.  There is extensive arterial vascular calcification.  IMPRESSION: Moderate joint effusion. Evidence of old trauma to the superior patella compared to the previous study. Re-injury in this area cannot be excluded, however. No other evidence of fracture. No dislocation.   Electronically Signed   By: Bretta Bang III M.D.   On: 08/18/2014 14:16   Dg Ankle Right Port  08/20/2014   CLINICAL DATA:  74 year old female status post ORIF. Initial encounter.  EXAM: PORTABLE RIGHT ANKLE - 2 VIEW  COMPARISON:  08/19/2014 and earlier  FINDINGS: Portable AP and cross-table lateral views. Splint material degrades bone detail. ORIF distal fibula and medial malleolus. Near anatomic alignment. No adverse features.  IMPRESSION: No adverse features status post right ankle ORIF.   Electronically Signed   By: Odessa Fleming M.D.   On: 08/20/2014 00:34   Dg C-arm 1-60 Min  08/20/2014   CLINICAL DATA:  74 year old female ORIF right ankle fracture  EXAM: DG C-ARM 61-120 MIN; RIGHT ANKLE - 2 VIEW  TECHNIQUE: Two intraoperative fluoroscopic views  FLUOROSCOPY TIME:  Fluoroscopy Time (in minutes and seconds): Not specified  COMPARISON:  08/18/2013  FINDINGS:  ORIF hardware at the distal fibula and medial malleolus. Near anatomic alignment. Adverse features.  IMPRESSION: ORIF right ankle, no adverse features.   Electronically Signed   By: Odessa Fleming M.D.   On: 08/20/2014 00:19    Microbiology: Recent Results (from the past 240 hour(s))  Surgical pcr screen     Status: None   Collection Time: 08/19/14  7:19 PM  Result Value Ref Range Status   MRSA, PCR NEGATIVE NEGATIVE Final   Staphylococcus aureus NEGATIVE NEGATIVE Final    Comment:        The Xpert SA Assay (FDA approved for NASAL specimens in patients over 76 years of age), is one component of a comprehensive surveillance program.  Test performance has been validated by [facility] for patients greater than or equal to 76 year old. It is not intended to diagnose infection nor to guide or monitor treatment.   Urine culture     Status: None (Preliminary result)   Collection Time: 08/20/14 11:30 AM  Result Value Ref Range Status   Specimen Description URINE, CATHETERIZED  Final   Special Requests NONE  Final   Colony Count   Final    >=100,000 COLONIES/ML Performed at [facility]    Culture   Final    GRAM POSITIVE COCCI Performed at [facility]    Report Status PENDING  Incomplete     Labs: Basic Metabolic Panel:  Recent Labs Lab 08/20/14 0830 08/20/14 1658 08/21/14 0914 08/22/14 0820 08/23/14 0615  NA 136 135 133* 131* 132*  K 5.6* 5.0 5.1 4.5 5.1  CL 103 104 102 103 105  CO2 22 21 22 24  17*  GLUCOSE 190* 80 156* 118* 113*  BUN 25* 29* 35* 43* 43*  CREATININE 2.68* 2.62* 2.49* 2.14* 1.81*  CALCIUM 8.1* 8.2* 8.2* 7.6* 8.1*  PHOS  --   --   --   --  4.8*   Liver Function Tests:  Recent Labs Lab 08/18/14 1843 08/23/14 0615  AST 23  --   ALT 15  --   ALKPHOS 57  --   BILITOT 0.8  --   PROT 6.1  --   ALBUMIN 2.9* 2.4*   No results for input(s): LIPASE, AMYLASE in the last 168 hours. No results for input(s): AMMONIA in the last 168  hours. CBC:  Recent Labs Lab 08/19/14 0632 08/20/14 0830 08/21/14 0914 08/22/14 0820 08/23/14 0615  WBC 6.3 9.1 10.7* 8.3 6.7  HGB 10.5* 10.6* 8.5* 7.9* 8.3*  HCT 32.1* 33.1* 26.4* 23.6* 25.4*  MCV 96.7 96.8 96.4 94.0 96.9  PLT 162 145* 136* 127* 133*   Cardiac Enzymes: No results for input(s): CKTOTAL, CKMB, CKMBINDEX, TROPONINI in the last 168 hours. BNP: BNP (last 3 results) No results for input(s): BNP in the last 8760 hours.  ProBNP (last 3 results)  Recent Labs  09/22/13 1448 09/30/13 1359  PROBNP 146.0* 178.0*    CBG:  Recent Labs Lab 08/22/14 0708 08/22/14 1148 08/22/14 1638 08/22/14 2115 08/23/14 0641  GLUCAP 157* 121* 122* 128* 105*       Signed:  Mailen [name]  [facility] 08/23/2014, 11:04 AM

## 2014-08-23 NOTE — Progress Notes (Signed)
Patient just informed this nurse that she does not have a ride home and wants to go via ambulance. Marcelline DeistEmily Vaughn notified and will make arrangements. Address confirmed.

## 2014-08-23 NOTE — Progress Notes (Signed)
ANTICOAGULATION CONSULT NOTE - Initial Consult  Pharmacy Consult for Coumadin Indication: VTE prophylaxis  No Known Allergies  Patient Measurements: Height:  (160 cm) Weight: 269 lb 6.4 oz (122.2 kg) IBW/kg (Calculated) : 52.4  Vital Signs: Temp: 98.1 F (36.7 C) (02/16 1300) BP: 132/54 mmHg (02/16 1300) Pulse Rate: 65 (02/16 1300)  Labs:  Recent Labs  08/21/14 0544  08/21/14 0914 08/22/14 0820 08/23/14 0615  HGB  --   < > 8.5* 7.9* 8.3*  HCT  --   --  26.4* 23.6* 25.4*  PLT  --   --  136* 127* 133*  LABPROT 15.5*  --   --  17.5* 28.5*  INR 1.21  --   --  1.42 2.65*  CREATININE  --   --  2.49* 2.14* 1.81*  < > = values in this interval not displayed.  Estimated Creatinine Clearance: 34 mL/min (by C-G formula based on Cr of 1.81).   Medical History: Past Medical History  Diagnosis Date  . Diabetes mellitus without complication   . Hypertension   . CHF (congestive heart failure)   . RBBB   . Dyslipidemia   . Morbid obesity   . Diastolic dysfunction   . Coronary artery disease     s/p CABG  . Hyperlipidemia   . OSA (obstructive sleep apnea)     mild OSA with AHI 7.15 now on CPAP at 16cm H2O    Medications:  Prescriptions prior to admission  Medication Sig Dispense Refill Last Dose  . albuterol-ipratropium (COMBIVENT) 18-103 MCG/ACT inhaler Inhale 2 puffs into the lungs every 6 (six) hours as needed for wheezing or shortness of breath.    Past Month at Unknown time  . allopurinol (ZYLOPRIM) 100 MG tablet Take 100 mg by mouth daily.   08/17/2014 at Unknown time  . amLODipine-valsartan (EXFORGE) 10-320 MG per tablet Take 1 tablet by mouth daily. 30 tablet 6 08/17/2014 at Unknown time  . aspirin EC 325 MG tablet Take 1 tablet (325 mg total) by mouth daily. 30 tablet 6 Past Week at Unknown time  . buPROPion (WELLBUTRIN XL) 150 MG 24 hr tablet Take 150 mg by mouth daily.   08/17/2014 at Unknown time  . cilostazol (PLETAL) 100 MG tablet Take 100 mg by mouth 2  (two) times daily.   08/17/2014 at Unknown time  . clotrimazole-betamethasone (LOTRISONE) lotion Apply 1 application topically 2 (two) times daily. To rash at breast and groin area as directed   Past Month at Unknown time  . ergocalciferol (VITAMIN D2) 50000 UNITS capsule Take 50,000 Units by mouth See admin instructions. Patient takes every Tuesday and Saturday   Past Week at Unknown time  . esomeprazole (NEXIUM) 40 MG capsule Take 40 mg by mouth daily before breakfast.   08/17/2014 at Unknown time  . fluticasone (FLOVENT HFA) 220 MCG/ACT inhaler Inhale 1-2 puffs into the lungs 2 (two) times daily.   08/17/2014 at Unknown time  . furosemide (LASIX) 20 MG tablet Take 1 tablet (20 mg total) by mouth every other day.   Past Week at Unknown time  . gabapentin (NEURONTIN) 300 MG capsule Take 300 mg by mouth 3 (three) times daily.   08/17/2014 at Unknown time  . imipramine (TOFRANIL) 10 MG tablet Take 50 mg by mouth at bedtime.   08/17/2014 at Unknown time  . metoprolol succinate (TOPROL XL) 25 MG 24 hr tablet Take 1 tablet (25 mg total) by mouth daily. 30 tablet 5 08/17/2014 at 7a  .  nystatin-triamcinolone (MYCOLOG II) cream Apply 1 application topically 2 (two) times daily. To rash at groin and breast area as directed   Past Week at Unknown time  . predniSONE (DELTASONE) 5 MG tablet Take 5 mg by mouth 2 (two) times daily.   08/17/2014 at Unknown time  . traZODone (DESYREL) 50 MG tablet Take 50-150 mg by mouth at bedtime as needed. As needed for sleep.  May increase to three tablets if needed (150mg )   08/17/2014 at Unknown time  . celecoxib (CELEBREX) 200 MG capsule Take 200 mg by mouth 2 (two) times daily.   Taking  . simvastatin (ZOCOR) 10 MG tablet Take 1 tablet (10 mg total) by mouth at bedtime. 30 tablet 11 Taking   Scheduled:  . allopurinol  100 mg Oral Daily  . aspirin EC  325 mg Oral Daily  . buPROPion  150 mg Oral Daily  . cefTRIAXone (ROCEPHIN)  IV  1 g Intravenous Q24H  . cilostazol  100 mg Oral  BID  . clotrimazole   Topical BID  . fluticasone  1-2 puff Inhalation BID  . gabapentin  300 mg Oral BID  . imipramine  50 mg Oral QHS  . insulin aspart  0-9 Units Subcutaneous TID WC  . metoprolol succinate  25 mg Oral Daily  . nystatin-triamcinolone  1 application Topical BID  . pantoprazole  40 mg Oral Daily  . predniSONE  5 mg Oral BID WC  . simvastatin  10 mg Oral QHS  . sodium chloride  3 mL Intravenous Q12H  . Vitamin D (Ergocalciferol)  50,000 Units Oral Once per day on Tue Sat  . Warfarin - Pharmacist Dosing Inpatient   Does not apply q1800   Infusions:     Assessment: 74yo female was getting into car when her "right leg gave out and twisted" resulting in ankle fracture, now s/p ORIF, on Coumadin for VTE Px. INR 1.42 > 2.65, big increase after 3 doses of coumadin 5mg  daily. Hgb and pltc are low but stable, No bleeding noted per chart. Plan for discharge today.   Goal of Therapy:  INR 2-3   Plan:  Hold coumadin today d/t big INR increase Consider coumadin 2.5 mg daily instead after discharge, and INR f/u Wednesday or Thursday.  Bayard HuggerMei Adryana Mogensen, PharmD, BCPS  Clinical Pharmacist  Pager: [phone]   08/23/2014 2:11 PM

## 2014-08-24 LAB — URINE CULTURE: Colony Count: >=100000

## 2014-09-01 ENCOUNTER — Telehealth: Payer: Self-pay | Admitting: Cardiology

## 2014-09-01 MED ORDER — FUROSEMIDE 20 MG PO TABS: 20.0000 mg | ORAL_TABLET | ORAL | Status: DC

## 2014-09-01 NOTE — Telephone Encounter (Signed)
Patient recently discharged from hospital to home with home health. [facility] patient's experiencing severe coughing, that is keeping her up at night, with ins/exp wheezing. Patient has no fever. Patient also have LLE edema +1. Called patient to see how she is feeling. Patient is wheezing and SOB on the phone, patient has not been taking Lasix, because it was stopped when leaving the hospital. Patient vital signs are BP144/70, HR 67, Resp. 18, and O2 99%.  Consulted Wilburt FinlayBryan Hager PA (FLEX), he ordered the patient to take 40 mg of Lasix today and then return to take 20 mg every other day. Patient also needs a BMET with in a week. Called Megan with [facility] to see if she can have labs done on nurses visit. Megan with [facility] took a verbal order for the BMET. Given fax number to fax results to office. Patient is refusing to get up to be weighed daily, due to recent ORIF of right ankle. Informed patient of orders and the importance of weighing daily. Patient verbalized understanding.

## 2014-09-01 NOTE — Telephone Encounter (Signed)
[name].

## 2014-09-01 NOTE — Telephone Encounter (Signed)
Follow up ° ° ° ° °Returning Lisa [name]'s call °

## 2014-09-01 NOTE — Telephone Encounter (Signed)
New Message  [facility] to recommend a phone call to pt. Per Aundra MilletMegan- Pt experiencing severe coughing keeping her up at night, ins/exp wheezing, no fever, left lower extremity edema- +1. Please call back and discuss. If needed-- Aundra MilletMegan346 [phone].

## 2014-09-01 NOTE — Telephone Encounter (Signed)
Left message to call back  

## 2014-09-05 ENCOUNTER — Telehealth: Payer: Self-pay | Admitting: Cardiology

## 2014-09-05 NOTE — Telephone Encounter (Signed)
Patient st she is congested but feels much better than she has felt in the last few days.  She has a pleasant demeanor and does not sound distressed. Instructed the patient to call if she has any questions or concerns or starts to feel poorly again.

## 2014-09-05 NOTE — Telephone Encounter (Signed)
New Msg         Pt home health nurse calling, Innocentia Dumor.  States Pt vitals are good but she's still having congestion and wheezing.   Please return call at [phone].

## 2014-09-13 ENCOUNTER — Telehealth: Payer: Self-pay

## 2014-09-13 DIAGNOSIS — I10 Essential (primary) hypertension: Secondary | ICD-10-CM

## 2014-09-13 DIAGNOSIS — I5032 Chronic diastolic (congestive) heart failure: Secondary | ICD-10-CM

## 2014-09-13 NOTE — Telephone Encounter (Signed)
Per Dr. Mayford Knifeurner, instructed patient to come in for lab work to recheck ionized calcium and albumin. Patient st she will get the blood work when she comes for her OV 3/15.   Labs ordered.

## 2014-09-13 NOTE — Telephone Encounter (Signed)
This encounter was created in error - please disregard.

## 2014-09-19 NOTE — Progress Notes (Signed)
Cardiology Office Note   Date:  09/20/2014   ID:  Lisa [name] Stonehocker, DOB October 03, 1940, MRN [id number]  PCP:  [name],[name], MD  Cardiologist:   Quintella ReichertURNER,Scott Vanderveer R, MD   Chief Complaint  Patient presents with  . Coronary Artery Disease  . Hypertension  . Congestive Heart Failure      History of Present Illness: Lisa [name] Hast is [name] 74 y.o. female with [name] history of CAD, HTN, dyslipidemia, diastolic dysfunction, chronic diastolic CHF. She is doing well. She fell recently and broke her foot in 3 places.  She denies any anginal chest pain, palpitations, dizziness or syncope. She has occasional LE edema which is stable. She has chronic DOE which was worse recently due to [name] cold but is now back to baseline. She is doing well with her CPAP device. She tolerates her full face mask and feels the pressure is adequate. She feels rested in the am and has no daytime sleepiness. She thinks her daytime sleepiness has improved. She still gets up some at night to go to the bathroom..    Past Medical History  Diagnosis Date  . Diabetes mellitus without complication   . Hypertension   . Chronic diastolic CHF (congestive heart failure)   . RBBB   . Dyslipidemia   . Morbid obesity   . Diastolic dysfunction   . Coronary artery disease     s/p CABG  . Hyperlipidemia   . OSA (obstructive sleep apnea)     mild OSA with AHI 7.15 now on CPAP at 16cm H2O    Past Surgical History  Procedure Laterality Date  . Stents      cardiac  . Cardiac surgery    . Knee surgery      bilateral  . Back surgery    . Rotator cuff surgery    . Coronary artery bypass graft    . Cardiac catheterization    . Orif ankle fracture Right 08/19/2014    Procedure: OPEN REDUCTION INTERNAL FIXATION (ORIF) ANKLE FRACTURE;  Surgeon: Cammy CopaGregory Scott Dean, MD;  [facility];  Service: Orthopedics;  Laterality: Right;     Current Outpatient Prescriptions  Medication Sig Dispense Refill  . albuterol-ipratropium (COMBIVENT)  18-103 MCG/ACT inhaler Inhale 2 puffs into the lungs every 6 (six) hours as needed for wheezing or shortness of breath.     . allopurinol (ZYLOPRIM) 100 MG tablet Take 100 mg by mouth daily.    Marland Kitchen. aspirin EC 81 MG tablet Take 1 tablet (81 mg total) by mouth daily. 90 tablet 3  . buPROPion (WELLBUTRIN XL) 150 MG 24 hr tablet Take 150 mg by mouth daily.    . cilostazol (PLETAL) 100 MG tablet Take 100 mg by mouth 2 (two) times daily.    . clotrimazole-betamethasone (LOTRISONE) lotion Apply 1 application topically 2 (two) times daily. To rash at breast and groin area as directed    . ergocalciferol (VITAMIN D2) 50000 UNITS capsule Take 50,000 Units by mouth See admin instructions. Patient takes every Tuesday and Saturday    . esomeprazole (NEXIUM) 40 MG capsule Take 40 mg by mouth daily before breakfast.    . fluticasone (FLOVENT HFA) 220 MCG/ACT inhaler Inhale 1-2 puffs into the lungs 2 (two) times daily.    . furosemide (LASIX) 20 MG tablet Take 1 tablet (20 mg total) by mouth every other day. Take 40 mg by mouth on 09/01/14, then take 20 mg every other day. 45 tablet 3  . gabapentin (NEURONTIN) 300  MG capsule Take 300 mg by mouth 3 (three) times daily.    Marland Kitchen HYDROcodone-acetaminophen (NORCO/VICODIN) 5-325 MG per tablet Take 1-2 tablets by mouth every 4 (four) hours as needed for moderate pain. (Patient not taking: Reported on 09/20/2014) 30 tablet 0  . imipramine (TOFRANIL) 10 MG tablet Take 50 mg by mouth at bedtime.    . metoprolol succinate (TOPROL XL) 25 MG 24 hr tablet Take 1 tablet (25 mg total) by mouth daily. 30 tablet 5  . mupirocin cream (BACTROBAN) 2 % Apply 1 application topically daily.    Marland Kitchen nystatin-triamcinolone (MYCOLOG II) cream Apply 1 application topically 2 (two) times daily. To rash at groin and breast area as directed    . oxyCODONE-acetaminophen (PERCOCET/ROXICET) 5-325 MG per tablet Take 1-2 tablets by mouth every 6 (six) hours as needed for severe pain.     . predniSONE  (DELTASONE) 5 MG tablet Take 5 mg by mouth 2 (two) times daily.    . simvastatin (ZOCOR) 10 MG tablet Take 1 tablet (10 mg total) by mouth at bedtime. 30 tablet 11  . traZODone (DESYREL) 50 MG tablet Take 50-150 mg by mouth at bedtime as needed. As needed for sleep.  May increase to three tablets if needed ( )    . warfarin (COUMADIN) 2.5 MG tablet Take 1 tablet (2.5 mg total) by mouth daily. 30 tablet 0   No current facility-administered medications for this visit.    Allergies:   Review of patient's allergies indicates no known allergies.    Social History:  The patient  reports that she has quit smoking. She does not have any smokeless tobacco history on file. She reports that she does not drink alcohol or use illicit drugs.   Family History:  The patient's family history includes Heart attack in her mother; Heart disease in her mother; Stomach cancer in her father.    ROS:  Please see the history of present illness.   Otherwise, review of systems are positive for none.   All other systems are reviewed and negative.    PHYSICAL EXAM: VS:  BP 148/78 mmHg  Pulse 53  Ht  (1.575 m)  Wt 247 lb 12.8 oz (112.401 kg)  BMI 45.31 kg/m2  SpO2 98% , BMI Body mass index is 45.31 kg/(m^2). GEN: Well nourished, well developed, in no acute distress HEENT: normal Neck: no JVD, carotid bruits, or masses Cardiac: RRR; no murmurs, rubs, or gallops,no edema  Respiratory:  clear to auscultation bilaterally, normal work of breathing GI: soft, nontender, nondistended, + BS MS: no deformity or atrophy Skin: warm and dry, no rash Neuro:  Strength and sensation are intact Psych: euthymic mood, full affect   EKG:  EKG is not ordered today.   Recent Labs: 09/22/2013: TSH 1.65 09/30/2013: Pro B Natriuretic peptide (BNP) 178.0* 08/18/2014: ALT 15 08/23/2014: BUN 43*; Creatinine 1.81*; Hemoglobin 8.3*; Platelets 133*; Potassium 5.1; Sodium 132*    Lipid Panel    Component Value Date/Time    CHOL 166 11/10/2013 0952   TRIG 135.0 11/10/2013 0952   HDL 61.70 11/10/2013 0952   CHOLHDL 3 11/10/2013 0952   VLDL 27.0 11/10/2013 0952   LDLCALC 77 11/10/2013 0952      Wt Readings from Last 3 Encounters:  09/20/14 247 lb 12.8 oz (112.401 kg)  08/23/14 269 lb 6.4 oz (122.2 kg)  03/22/14 243 lb (110.224 kg)      ASSESSMENT AND PLAN:  1. ASCAD stable with no angina - continue ASA and decrease  to  daily 2. HTN - well controlled - continue  Metoprolol  4. Dyslipidemia - continue simvastatin.  Check FLP and ALT today  5.  Chronic diastolic CHF - compensated  - continue beta blocker and Lasix   6. OSA now on CPAP - she has not been using it recently and I encouraged her to start using it again.  7. PVC's - asymptomatic on BB  Current medicines are reviewed at length with the patient today.  The patient does not have concerns regarding medicines.  The following changes have been made:  no change  Labs/ tests ordered today include: None  No orders of the defined types were placed in this encounter.     Disposition:   FU with me in 6 months   Signed, Quintella Reichert, MD  09/20/2014 2:16 PM    [facility] [address] [place], [place], [place]  [address] Phone: [phone]; Fax: [phone]

## 2014-09-20 ENCOUNTER — Other Ambulatory Visit (INDEPENDENT_AMBULATORY_CARE_PROVIDER_SITE_OTHER): Payer: Medicare Other | Admitting: *Deleted

## 2014-09-20 ENCOUNTER — Encounter: Payer: Self-pay | Admitting: Cardiology

## 2014-09-20 ENCOUNTER — Ambulatory Visit (INDEPENDENT_AMBULATORY_CARE_PROVIDER_SITE_OTHER): Payer: Medicare Other | Admitting: Cardiology

## 2014-09-20 VITALS — BP 148/78 | HR 53 | Ht 62.0 in | Wt 247.8 lb

## 2014-09-20 DIAGNOSIS — E785 Hyperlipidemia, unspecified: Secondary | ICD-10-CM

## 2014-09-20 DIAGNOSIS — I2583 Coronary atherosclerosis due to lipid rich plaque: Principal | ICD-10-CM

## 2014-09-20 DIAGNOSIS — I5032 Chronic diastolic (congestive) heart failure: Secondary | ICD-10-CM

## 2014-09-20 DIAGNOSIS — I10 Essential (primary) hypertension: Secondary | ICD-10-CM

## 2014-09-20 DIAGNOSIS — G4733 Obstructive sleep apnea (adult) (pediatric): Secondary | ICD-10-CM

## 2014-09-20 DIAGNOSIS — I493 Ventricular premature depolarization: Secondary | ICD-10-CM

## 2014-09-20 DIAGNOSIS — I2510 Atherosclerotic heart disease of native coronary artery without angina pectoris: Secondary | ICD-10-CM | POA: Diagnosis not present

## 2014-09-20 LAB — LIPID PANEL
CHOL/HDL RATIO: 2
Cholesterol: 156 mg/dL (ref 0–200)
HDL: 86.7 mg/dL (ref >39.00)
LDL CALC: 57 mg/dL (ref 0–99)
NonHDL: 69.3
TRIGLYCERIDES: 63 mg/dL (ref 0.0–149.0)
VLDL: 12.6 mg/dL (ref 0.0–40.0)

## 2014-09-20 LAB — HEPATIC FUNCTION PANEL
ALK PHOS: 55 U/L (ref 39–117)
ALT: 15 U/L (ref 0–35)
AST: 21 U/L (ref 0–37)
Albumin: 3.2 g/dL — ABNORMAL LOW (ref 3.5–5.2)
BILIRUBIN TOTAL: 0.7 mg/dL (ref 0.2–1.2)
Bilirubin, Direct: 0.2 mg/dL (ref 0.0–0.3)
Total Protein: 6.3 g/dL (ref 6.0–8.3)

## 2014-09-20 MED ORDER — ASPIRIN EC 81 MG PO TBEC: 81.0000 mg | DELAYED_RELEASE_TABLET | Freq: Every day | ORAL | Status: DC

## 2014-09-20 NOTE — Addendum Note (Signed)
Addended by: Tonita PhoenixBOWDEN, ROBIN K on: 09/20/2014 01:59 PM   Modules accepted: Orders

## 2014-09-20 NOTE — Addendum Note (Signed)
Addended by: Tonita PhoenixBOWDEN, Sadae Arrazola K on: 09/20/2014 02:12 PM   Modules accepted: Orders

## 2014-09-20 NOTE — Patient Instructions (Signed)
Your physician has recommended you make the following change in your medication:  1) DECREASE ASPIRIN to 81 mg daily  Your physician wants you to follow-up in: 6 months with Dr. Mayford Knifeurner. You will receive a reminder letter in the mail two months in advance. If you don't receive a letter, please call our office to schedule the follow-up appointment.

## 2014-09-20 NOTE — Addendum Note (Signed)
Addended by: Tonita PhoenixBOWDEN, Radek Carnero K on: 09/20/2014 02:07 PM   Modules accepted: Orders

## 2014-09-20 NOTE — Addendum Note (Signed)
Addended by: Latonya Nelon K on: 09/20/2014 02:12 PM   Modules accepted: Orders  

## 2014-09-21 LAB — CALCIUM, IONIZED: Calcium, Ion: 1.22 mmol/L (ref 1.12–1.32)

## 2014-09-23 ENCOUNTER — Other Ambulatory Visit: Payer: Self-pay | Admitting: Cardiology

## 2014-09-29 ENCOUNTER — Encounter: Payer: Self-pay | Admitting: Cardiology

## 2014-10-07 ENCOUNTER — Telehealth: Payer: Self-pay | Admitting: Cardiology

## 2014-10-07 NOTE — Telephone Encounter (Signed)
New message   [facility] calling.    Pt c/o medication issue: 1. Name of Medication:metoprolol  25 mg    2. How are you currently taking this medication (dosage and times per day)? One tab daily   3. Are you having a reaction (difficulty breathing--STAT)?  No mention from [facility].     4. What is your medication issue? Patient has not taken medication today b/p  158/62 pulse  50 . Please advise

## 2014-10-07 NOTE — Telephone Encounter (Signed)
[facility]leg[facility]Kathy [facility] calling stating that pt's pulse today was 50 and BP is 158/62. Olegario MessierKathy states that pt's pulse Wednesday was 52. Olegario MessierKathy states that when looking through nurses notes and her PT notes that pt is noted to have a pulse in the 50's once a week. Pt has not taken Metoprolol yet today. Advised her to have pt hold Metoprolol today and that I will make Dr. Mayford Knifeurner aware of this information and will call pt back if Dr. Mayford Knifeurner has any changes. Olegario MessierKathy verbalized understanding and was in agreement with this plan.

## 2014-10-07 NOTE — Telephone Encounter (Signed)
lmtcb Debbie Minela [place] RN  

## 2014-10-08 NOTE — Telephone Encounter (Signed)
Decrease Toprol to 25mg  1/2 tablet daily and check BP and HR daily for 1 week and call with results

## 2014-10-10 NOTE — Telephone Encounter (Signed)
PT RTN CALL -PLS CALL °

## 2014-10-10 NOTE — Telephone Encounter (Signed)
Follow Up        Pt's daughter calling stating that someone from our office called and left a message for them to call back. Please call back and advise.

## 2014-10-10 NOTE — Telephone Encounter (Signed)
Pt's daughter, Natalia LeatherwoodKatherine advised, verbalized understanding.

## 2014-10-10 NOTE — Telephone Encounter (Signed)
Called grandaughter back was told that it is her aunt, Lisa JensenKathryn [name] and her grandmother trying to reach our office.  Called number left from last called, did not get answer. Called pt home number, left message.

## 2014-10-10 NOTE — Telephone Encounter (Addendum)
Below medication change recommended: Toprol XL is 24 hr tablet not to be cut in half.  Is there another medication you would like to use in its place? Routed to MD desktop  "Decrease Toprol to 25mg  1/2 tablet daily and check BP and HR daily for 1 week and call with results"

## 2014-10-10 NOTE — Telephone Encounter (Signed)
Called granddaughter, Edson SnowballShana, back.  No answer. Unable to leave a message.

## 2014-10-10 NOTE — Telephone Encounter (Signed)
Toprol can be cut in half

## 2014-10-27 ENCOUNTER — Other Ambulatory Visit (INDEPENDENT_AMBULATORY_CARE_PROVIDER_SITE_OTHER): Payer: Medicare Other | Admitting: *Deleted

## 2014-10-27 DIAGNOSIS — E119 Type 2 diabetes mellitus without complications: Secondary | ICD-10-CM | POA: Diagnosis not present

## 2014-10-27 DIAGNOSIS — I10 Essential (primary) hypertension: Secondary | ICD-10-CM | POA: Diagnosis not present

## 2014-10-27 LAB — CBC WITH DIFFERENTIAL/PLATELET
BASOS PCT: 0.6 % (ref 0.0–3.0)
Basophils Absolute: 0 10*3/uL (ref 0.0–0.1)
Eosinophils Absolute: 0.1 10*3/uL (ref 0.0–0.7)
Eosinophils Relative: 1.8 % (ref 0.0–5.0)
HEMATOCRIT: 34.1 % — AB (ref 36.0–46.0)
Hemoglobin: 11.4 g/dL — ABNORMAL LOW (ref 12.0–15.0)
LYMPHS PCT: 30.5 % (ref 12.0–46.0)
Lymphs Abs: 1.7 10*3/uL (ref 0.7–4.0)
MCHC: 33.6 g/dL (ref 30.0–36.0)
MCV: 92.7 fl (ref 78.0–100.0)
Monocytes Absolute: 0.4 10*3/uL (ref 0.1–1.0)
Monocytes Relative: 7.3 % (ref 3.0–12.0)
Neutro Abs: 3.3 10*3/uL (ref 1.4–7.7)
Neutrophils Relative %: 59.8 % (ref 43.0–77.0)
Platelets: 149 10*3/uL — ABNORMAL LOW (ref 150.0–400.0)
RBC: 3.68 Mil/uL — AB (ref 3.87–5.11)
RDW: 13.8 % (ref 11.5–15.5)
WBC: 5.5 10*3/uL (ref 4.0–10.5)

## 2014-10-27 LAB — COMPREHENSIVE METABOLIC PANEL
ALBUMIN: 3 g/dL — AB (ref 3.5–5.2)
ALT: 11 U/L (ref 0–35)
AST: 21 U/L (ref 0–37)
Alkaline Phosphatase: 51 U/L (ref 39–117)
BUN: 11 mg/dL (ref 6–23)
CALCIUM: 8.8 mg/dL (ref 8.4–10.5)
CHLORIDE: 106 meq/L (ref 96–112)
CO2: 25 mEq/L (ref 19–32)
CREATININE: 1.03 mg/dL (ref 0.40–1.20)
GFR: 67.05 mL/min (ref >60.00)
Glucose, Bld: 115 mg/dL — ABNORMAL HIGH (ref 70–99)
POTASSIUM: 4 meq/L (ref 3.5–5.1)
Sodium: 137 mEq/L (ref 135–145)
Total Bilirubin: 0.5 mg/dL (ref 0.2–1.2)
Total Protein: 6.5 g/dL (ref 6.0–8.3)

## 2014-10-27 LAB — HEMOGLOBIN A1C: Hgb A1c MFr Bld: 5.6 % (ref 4.6–6.5)

## 2014-10-27 LAB — SEDIMENTATION RATE: Sed Rate: 66 mm/hr — ABNORMAL HIGH (ref 0–22)

## 2014-10-27 NOTE — Addendum Note (Signed)
Addended by: Deland Slocumb K on: 10/27/2014 10:05 AM   Modules accepted: Orders  

## 2014-10-27 NOTE — Addendum Note (Signed)
Addended by: Tonita PhoenixBOWDEN, ROBIN K on: 10/27/2014 10:05 AM   Modules accepted: Orders

## 2014-11-10 ENCOUNTER — Telehealth: Payer: Self-pay | Admitting: Cardiology

## 2014-11-10 NOTE — Telephone Encounter (Signed)
New message ° ° ° ° °Want test results °

## 2014-11-10 NOTE — Telephone Encounter (Signed)
Patient requesting ECHO results

## 2014-11-10 NOTE — Telephone Encounter (Signed)
Echo showed low normal LVF with EF 50-55% with moderate TR, moderate LA and RA enlargement

## 2014-11-11 NOTE — Telephone Encounter (Signed)
Informed patient of results and verbal understanding expressed.  

## 2014-12-07 ENCOUNTER — Other Ambulatory Visit: Payer: Self-pay | Admitting: Cardiology

## 2014-12-21 ENCOUNTER — Encounter: Payer: Self-pay | Admitting: Cardiology

## 2015-02-14 ENCOUNTER — Other Ambulatory Visit: Payer: Self-pay | Admitting: Cardiology

## 2015-03-21 ENCOUNTER — Ambulatory Visit (INDEPENDENT_AMBULATORY_CARE_PROVIDER_SITE_OTHER): Payer: Medicare Other | Admitting: Cardiology

## 2015-03-21 ENCOUNTER — Encounter: Payer: Self-pay | Admitting: Cardiology

## 2015-03-21 VITALS — BP 136/84 | HR 105 | Ht 62.0 in | Wt 226.0 lb

## 2015-03-21 DIAGNOSIS — I2510 Atherosclerotic heart disease of native coronary artery without angina pectoris: Secondary | ICD-10-CM

## 2015-03-21 DIAGNOSIS — I2583 Coronary atherosclerosis due to lipid rich plaque: Principal | ICD-10-CM

## 2015-03-21 DIAGNOSIS — I493 Ventricular premature depolarization: Secondary | ICD-10-CM

## 2015-03-21 DIAGNOSIS — I5032 Chronic diastolic (congestive) heart failure: Secondary | ICD-10-CM

## 2015-03-21 DIAGNOSIS — I10 Essential (primary) hypertension: Secondary | ICD-10-CM | POA: Diagnosis not present

## 2015-03-21 DIAGNOSIS — I480 Paroxysmal atrial fibrillation: Secondary | ICD-10-CM

## 2015-03-21 MED ORDER — APIXABAN 5 MG PO TABS
5.0000 mg | ORAL_TABLET | Freq: Two times a day (BID) | ORAL | Status: DC
Start: 2015-03-21 — End: 2015-04-24

## 2015-03-21 MED ORDER — METOPROLOL SUCCINATE ER 25 MG PO TB24: 25.0000 mg | ORAL_TABLET | Freq: Every day | ORAL | Status: DC

## 2015-03-21 NOTE — Patient Instructions (Signed)
Medication Instructions:  Your physician has recommended you make the following change in your medication:  1) INCREASE TOPROL to 25 mg daily 2) START ELIQUIS 5 mg TWICE DAILY  Labwork: Your physician recommends that you return for FASTING lab work THIS WEEK: BMET, CBC, TSH, LFTs, BNP, Lipids  Testing/Procedures: Your physician has requested that you have an echocardiogram. Echocardiography is a painless test that uses sound waves to create images of your heart. It provides your doctor with information about the size and shape of your heart and how well your heart's chambers and valves are working. This procedure takes approximately one hour. There are no restrictions for this procedure.  Follow-Up: Your physician recommends that you schedule a follow-up appointment in: 4 weeks with a PA or NP.  Your physician wants you to follow-up in: 6 months with Dr. Mayford Knife. You will receive a reminder letter in the mail two months in advance. If you don't receive a letter, please call our office to schedule the follow-up appointment.   Any Other Special Instructions Will Be Listed Below (If Applicable).

## 2015-03-21 NOTE — Progress Notes (Signed)
Cardiology Office Note   Date:  03/21/2015   ID:  Lisa [name], DOB 15-Mar-1941, MRN [id number]  PCP:  Laurena Slimmer, MD    No chief complaint on file.     History of Present Illness: Lisa [name] is a 74 y.o. female with a history of CAD, HTN, dyslipidemia, diastolic dysfunction, chronic diastolic CHF. She has noticed recently that she has had increased SOB and DOE for a few months.  She denies any anginal chest pain, palpitations, or syncope. She has chronic LE edema which is stable.  She has not used her CPAP in some time and does remember the last time she used it.  She is down 20lbs from March.      Past Medical History  Diagnosis Date  . Diabetes mellitus without complication   . Hypertension   . Chronic diastolic CHF (congestive heart failure)   . RBBB   . Dyslipidemia   . Morbid obesity   . Diastolic dysfunction   . Coronary artery disease     s/p CABG  . Hyperlipidemia   . OSA (obstructive sleep apnea)     mild OSA with AHI 7.15 now on CPAP at 16cm H2O    Past Surgical History  Procedure Laterality Date  . Stents      cardiac  . Cardiac surgery    . Knee surgery      bilateral  . Back surgery    . Rotator cuff surgery    . Coronary artery bypass graft    . Cardiac catheterization    . Orif ankle fracture Right 08/19/2014    Procedure: OPEN REDUCTION INTERNAL FIXATION (ORIF) ANKLE FRACTURE;  Surgeon: Cammy Copa, MD;  Location: [facility] OR;  Service: Orthopedics;  Laterality: Right;     Current Outpatient Prescriptions  Medication Sig Dispense Refill  . albuterol-ipratropium (COMBIVENT) 18-103 MCG/ACT inhaler Inhale 2 puffs into the lungs every 6 (six) hours as needed for wheezing or shortness of breath.     . allopurinol (ZYLOPRIM) 100 MG tablet Take 100 mg by mouth daily.    Marland Kitchen aspirin EC 81 MG tablet Take 1 tablet (81 mg total) by mouth daily. 90 tablet 3  . buPROPion (WELLBUTRIN XL) 150 MG 24 hr tablet Take 150 mg by  mouth daily.    . cilostazol (PLETAL) 100 MG tablet Take 100 mg by mouth 2 (two) times daily.    . clotrimazole-betamethasone (LOTRISONE) lotion Apply 1 application topically 2 (two) times daily. To rash at breast and groin area as directed    . ergocalciferol (VITAMIN D2) 50000 UNITS capsule Take 50,000 Units by mouth See admin instructions. Patient takes every Tuesday and Saturday    . esomeprazole (NEXIUM) 40 MG capsule Take 40 mg by mouth daily before breakfast.    . fluticasone (FLOVENT HFA) 220 MCG/ACT inhaler Inhale 1-2 puffs into the lungs 2 (two) times daily.    . furosemide (LASIX) 20 MG tablet Take 1 tablet (20 mg total) by mouth every other day. Take 40 mg by mouth on 09/01/14, then take 20 mg every other day. 45 tablet 3  . gabapentin (NEURONTIN) 300 MG capsule Take 300 mg by mouth 3 (three) times daily.    Marland Kitchen HYDROcodone-acetaminophen (NORCO/VICODIN) 5-325 MG per tablet Take 1-2 tablets by mouth every 4 (four) hours as needed for moderate pain. 30 tablet 0  . imipramine (TOFRANIL) 10 MG tablet Take  50 mg by mouth at bedtime.    . metoprolol succinate (TOPROL-XL) 25 MG 24 hr tablet Take 0.5 tablets (12.5 mg total) by mouth daily. 45 tablet 1  . mupirocin cream (BACTROBAN) 2 % Apply 1 application topically daily.    Marland Kitchen nystatin-triamcinolone (MYCOLOG II) cream Apply 1 application topically 2 (two) times daily. To rash at groin and breast area as directed    . oxyCODONE-acetaminophen (PERCOCET/ROXICET) 5-325 MG per tablet Take 1-2 tablets by mouth every 6 (six) hours as needed for severe pain.     . predniSONE (DELTASONE) 5 MG tablet Take 5 mg by mouth 2 (two) times daily.    . simvastatin (ZOCOR) 10 MG tablet TAKE 1 TABLET BY MOUTH EVERY NIGHT AT BEDTIME 30 tablet 6  . traZODone (DESYREL) 50 MG tablet Take 50-150 mg by mouth at bedtime as needed. As needed for sleep.  May increase to three tablets if needed ( )     No current facility-administered medications for this visit.     Allergies:   Review of patient's allergies indicates no known allergies.    Social History:  The patient  reports that she has quit smoking. She does not have any smokeless tobacco history on file. She reports that she does not drink alcohol or use illicit drugs.   Family History:  The patient's family history includes Heart attack in her mother; Heart disease in her mother; Stomach cancer in her father.    ROS:  Please see the history of present illness.   Otherwise, review of systems are positive for none.   All other systems are reviewed and negative.    PHYSICAL EXAM: VS:  BP 136/84 mmHg  Pulse 105  Ht  (1.575 m)  Wt 226 lb (102.513 kg)  BMI 41.33 kg/m2  SpO2 99% , BMI Body mass index is 41.33 kg/(m^2). GEN: Well nourished, well developed, in no acute distress HEENT: normal Neck: no JVD, carotid bruits, or masses Cardiac: irregularly irregular; no murmurs, rubs, or gallops,no edema  Respiratory:  clear to auscultation bilaterally, normal work of breathing GI: soft, nontender, nondistended, + BS MS: no deformity or atrophy Skin: warm and dry, no rash Neuro:  Strength and sensation are intact Psych: euthymic mood, full affect  EKG:  EKG  ordered today and showed atrial fibrillation with RVR and RBBB, LVH    Recent Labs: 10/27/2014: ALT 11; BUN 11; Creatinine, Ser 1.03; Hemoglobin 11.4*; Platelets 149.0*; Potassium 4.0; Sodium 137    Lipid Panel    Component Value Date/Time   CHOL 156 09/20/2014 1407   TRIG 63.0 09/20/2014 1407   HDL 86.70 09/20/2014 1407   CHOLHDL 2 09/20/2014 1407   VLDL 12.6 09/20/2014 1407   LDLCALC 57 09/20/2014 1407      Wt Readings from Last 3 Encounters:  03/21/15 226 lb (102.513 kg)  09/20/14 247 lb 12.8 oz (112.401 kg)  08/23/14 269 lb 6.4 oz (122.2 kg)     ASSESSMENT AND PLAN:  1. ASCAD stable with no angina - stop ASA since we are starting on Eliquis 2. HTN - well controlled - continue Metoprolol  4. Dyslipidemia -  continue simvastatin. Check FLP and ALT   5. Chronic diastolic CHF - appears compensated on exam but is SOB which I suspect is from new afib. - continue beta blocker and Lasix   6. OSA now on CPAP - she has not been using it recently and I encouraged her to start using it again.  This may be  the etiology of her OSA.  7. PVC's - asymptomatic on BB       8.  New onset atrial fibrillation with RVR of unknown duration.  I will increase her Toprol to 25mg  1 tablet daily.  I will check a 2D echo to assess LVF and LA size.  This patients CHA2DS2-VASc Score and unadjusted Ischemic Stroke Rate (% per year) is equal to  9.6% stroke rate/year from a score of 7.  Above score calculated as 1 point each if present [CHF, HTN, DM, Vascular=MI/PAD/Aortic Plaque, Age if 65-74, or Female].  Above score calculated as 2 points each if present [Age > 75, or Stroke/TIA/TE].  Start Eliquis 5mg  BID.   Current medicines are reviewed at length with the patient today.  The patient does not have concerns regarding medicines.  The following changes have been made:  no change  Labs/ tests ordered today: See above Assessment and Plan No orders of the defined types were placed in this encounter.     Disposition:   FU with me in 6 months  Signed, Quintella Reichert, MD  03/21/2015 3:57 PM    [facility] [address] [place], [place], [place]  [address] Phone: [phone]; Fax: [phone]

## 2015-03-29 ENCOUNTER — Other Ambulatory Visit: Payer: Self-pay

## 2015-03-29 ENCOUNTER — Other Ambulatory Visit (INDEPENDENT_AMBULATORY_CARE_PROVIDER_SITE_OTHER): Payer: Medicare Other

## 2015-03-29 ENCOUNTER — Ambulatory Visit (HOSPITAL_COMMUNITY): Payer: Medicare Other | Attending: Internal Medicine

## 2015-03-29 DIAGNOSIS — I4510 Unspecified right bundle-branch block: Secondary | ICD-10-CM | POA: Diagnosis not present

## 2015-03-29 DIAGNOSIS — Z87891 Personal history of nicotine dependence: Secondary | ICD-10-CM | POA: Diagnosis not present

## 2015-03-29 DIAGNOSIS — E119 Type 2 diabetes mellitus without complications: Secondary | ICD-10-CM | POA: Insufficient documentation

## 2015-03-29 DIAGNOSIS — I480 Paroxysmal atrial fibrillation: Secondary | ICD-10-CM

## 2015-03-29 DIAGNOSIS — I059 Rheumatic mitral valve disease, unspecified: Secondary | ICD-10-CM | POA: Insufficient documentation

## 2015-03-29 DIAGNOSIS — I2510 Atherosclerotic heart disease of native coronary artery without angina pectoris: Secondary | ICD-10-CM | POA: Diagnosis not present

## 2015-03-29 DIAGNOSIS — G4733 Obstructive sleep apnea (adult) (pediatric): Secondary | ICD-10-CM | POA: Insufficient documentation

## 2015-03-29 DIAGNOSIS — I517 Cardiomegaly: Secondary | ICD-10-CM | POA: Diagnosis not present

## 2015-03-29 DIAGNOSIS — Z8249 Family history of ischemic heart disease and other diseases of the circulatory system: Secondary | ICD-10-CM | POA: Diagnosis not present

## 2015-03-29 DIAGNOSIS — I340 Nonrheumatic mitral (valve) insufficiency: Secondary | ICD-10-CM | POA: Diagnosis not present

## 2015-03-29 DIAGNOSIS — I358 Other nonrheumatic aortic valve disorders: Secondary | ICD-10-CM | POA: Diagnosis not present

## 2015-03-29 DIAGNOSIS — Z951 Presence of aortocoronary bypass graft: Secondary | ICD-10-CM | POA: Insufficient documentation

## 2015-03-29 DIAGNOSIS — E785 Hyperlipidemia, unspecified: Secondary | ICD-10-CM | POA: Diagnosis not present

## 2015-03-29 DIAGNOSIS — I2583 Coronary atherosclerosis due to lipid rich plaque: Secondary | ICD-10-CM

## 2015-03-29 DIAGNOSIS — Z6841 Body Mass Index (BMI) 40.0 and over, adult: Secondary | ICD-10-CM | POA: Diagnosis not present

## 2015-03-29 DIAGNOSIS — I5032 Chronic diastolic (congestive) heart failure: Secondary | ICD-10-CM

## 2015-03-29 DIAGNOSIS — E669 Obesity, unspecified: Secondary | ICD-10-CM | POA: Diagnosis not present

## 2015-03-29 DIAGNOSIS — I10 Essential (primary) hypertension: Secondary | ICD-10-CM | POA: Diagnosis not present

## 2015-03-29 DIAGNOSIS — I4891 Unspecified atrial fibrillation: Secondary | ICD-10-CM | POA: Insufficient documentation

## 2015-03-29 DIAGNOSIS — I071 Rheumatic tricuspid insufficiency: Secondary | ICD-10-CM | POA: Insufficient documentation

## 2015-03-29 LAB — CBC WITH DIFFERENTIAL/PLATELET
Basophils Absolute: 0.1 10*3/uL (ref 0.0–0.1)
Basophils Relative: 0.7 % (ref 0.0–3.0)
EOS ABS: 0.1 10*3/uL (ref 0.0–0.7)
EOS PCT: 1.7 % (ref 0.0–5.0)
HCT: 35.7 % — ABNORMAL LOW (ref 36.0–46.0)
Hemoglobin: 11.6 g/dL — ABNORMAL LOW (ref 12.0–15.0)
LYMPHS ABS: 2.2 10*3/uL (ref 0.7–4.0)
Lymphocytes Relative: 29.2 % (ref 12.0–46.0)
MCHC: 32.5 g/dL (ref 30.0–36.0)
MCV: 95.8 fl (ref 78.0–100.0)
MONO ABS: 0.6 10*3/uL (ref 0.1–1.0)
Monocytes Relative: 7.5 % (ref 3.0–12.0)
NEUTROS PCT: 60.9 % (ref 43.0–77.0)
Neutro Abs: 4.5 10*3/uL (ref 1.4–7.7)
Platelets: 191 10*3/uL (ref 150.0–400.0)
RBC: 3.72 Mil/uL — AB (ref 3.87–5.11)
RDW: 14.8 % (ref 11.5–15.5)
WBC: 7.4 10*3/uL (ref 4.0–10.5)

## 2015-03-29 LAB — HEPATIC FUNCTION PANEL
ALT: 15 U/L (ref 0–35)
AST: 26 U/L (ref 0–37)
Albumin: 3.1 g/dL — ABNORMAL LOW (ref 3.5–5.2)
Alkaline Phosphatase: 52 U/L (ref 39–117)
BILIRUBIN DIRECT: 0.2 mg/dL (ref 0.0–0.3)
BILIRUBIN TOTAL: 0.6 mg/dL (ref 0.2–1.2)
Total Protein: 6.3 g/dL (ref 6.0–8.3)

## 2015-03-29 LAB — LIPID PANEL
CHOL/HDL RATIO: 3
CHOLESTEROL: 145 mg/dL (ref 0–200)
HDL: 52.8 mg/dL (ref >39.00)
LDL CALC: 65 mg/dL (ref 0–99)
NonHDL: 92.25
TRIGLYCERIDES: 135 mg/dL (ref 0.0–149.0)
VLDL: 27 mg/dL (ref 0.0–40.0)

## 2015-03-29 LAB — BASIC METABOLIC PANEL
BUN: 25 mg/dL — AB (ref 6–23)
CALCIUM: 8.9 mg/dL (ref 8.4–10.5)
CO2: 31 mEq/L (ref 19–32)
CREATININE: 1.1 mg/dL (ref 0.40–1.20)
Chloride: 106 mEq/L (ref 96–112)
GFR: 62.08 mL/min (ref >60.00)
GLUCOSE: 103 mg/dL — AB (ref 70–99)
POTASSIUM: 4.4 meq/L (ref 3.5–5.1)
Sodium: 141 mEq/L (ref 135–145)

## 2015-03-29 LAB — TSH: TSH: 2.36 u[IU]/mL (ref 0.35–4.50)

## 2015-03-29 LAB — BRAIN NATRIURETIC PEPTIDE: Pro B Natriuretic peptide (BNP): 436 pg/mL — ABNORMAL HIGH (ref 0.0–100.0)

## 2015-03-30 ENCOUNTER — Telehealth: Payer: Self-pay

## 2015-03-30 DIAGNOSIS — I10 Essential (primary) hypertension: Secondary | ICD-10-CM

## 2015-03-30 NOTE — Telephone Encounter (Signed)
Informed patient of results and verbal understanding expressed.  Instructed patient to INCREASE LASIX to 40 mg daily for 3 days then decrease back to normal 20 mg daily.  Repeat BMET scheduled for 04/06/2015. Patient agrees with treatment plan.

## 2015-03-30 NOTE — Telephone Encounter (Signed)
-----   Message from Quintella Reichert, MD sent at 03/29/2015  5:36 PM EDT ----- BNP elevated - increase Lasix to  daily for 3 days then  daily and recheck BMET in 1 week

## 2015-04-06 ENCOUNTER — Encounter: Payer: Self-pay | Admitting: Cardiology

## 2015-04-06 ENCOUNTER — Other Ambulatory Visit (INDEPENDENT_AMBULATORY_CARE_PROVIDER_SITE_OTHER): Payer: Medicare Other

## 2015-04-06 DIAGNOSIS — I10 Essential (primary) hypertension: Secondary | ICD-10-CM

## 2015-04-06 LAB — BASIC METABOLIC PANEL
BUN: 19 mg/dL (ref 6–23)
CALCIUM: 8.8 mg/dL (ref 8.4–10.5)
CO2: 31 meq/L (ref 19–32)
CREATININE: 1.19 mg/dL (ref 0.40–1.20)
Chloride: 103 mEq/L (ref 96–112)
GFR: 56.69 mL/min — ABNORMAL LOW (ref >60.00)
Glucose, Bld: 105 mg/dL — ABNORMAL HIGH (ref 70–99)
Potassium: 3.8 mEq/L (ref 3.5–5.1)
Sodium: 141 mEq/L (ref 135–145)

## 2015-04-10 ENCOUNTER — Telehealth: Payer: Self-pay

## 2015-04-10 NOTE — Telephone Encounter (Signed)
Prior auth sent to [facility] for Eliquis 5 mg.

## 2015-04-14 ENCOUNTER — Telehealth: Payer: Self-pay

## 2015-04-14 NOTE — Telephone Encounter (Signed)
Checking with [employer] on [place] for Eliquis. Should have a response in 48-72 hours

## 2015-04-21 ENCOUNTER — Telehealth: Payer: Self-pay

## 2015-04-21 NOTE — Telephone Encounter (Signed)
Eliquis 5mg  approved by [employer] for 1 year/ 04/23/2016.

## 2015-04-24 ENCOUNTER — Encounter: Payer: Self-pay | Admitting: Physician Assistant

## 2015-04-24 ENCOUNTER — Other Ambulatory Visit: Payer: Self-pay | Admitting: *Deleted

## 2015-04-24 ENCOUNTER — Ambulatory Visit (INDEPENDENT_AMBULATORY_CARE_PROVIDER_SITE_OTHER): Payer: Medicare Other | Admitting: Physician Assistant

## 2015-04-24 VITALS — BP 122/74 | HR 73 | Ht 62.0 in | Wt 243.4 lb

## 2015-04-24 DIAGNOSIS — I2510 Atherosclerotic heart disease of native coronary artery without angina pectoris: Secondary | ICD-10-CM | POA: Diagnosis not present

## 2015-04-24 DIAGNOSIS — I481 Persistent atrial fibrillation: Secondary | ICD-10-CM | POA: Diagnosis not present

## 2015-04-24 DIAGNOSIS — I5032 Chronic diastolic (congestive) heart failure: Secondary | ICD-10-CM | POA: Diagnosis not present

## 2015-04-24 DIAGNOSIS — I4819 Other persistent atrial fibrillation: Secondary | ICD-10-CM

## 2015-04-24 MED ORDER — FUROSEMIDE 20 MG PO TABS: 20.0000 mg | ORAL_TABLET | Freq: Every day | ORAL | Status: DC

## 2015-04-24 MED ORDER — POTASSIUM CHLORIDE CRYS ER 20 MEQ PO TBCR: 20.0000 meq | EXTENDED_RELEASE_TABLET | Freq: Every day | ORAL | Status: DC

## 2015-04-24 NOTE — Patient Instructions (Signed)
Medication Instructions:  Your physician has recommended you make the following change in your medication:  1.  CHANGE the Lasix (Furosemide) to 20 mg tablets taking 1 tablet EVERY DAY 2.  START the Potassium (K[place]on) 20 meq tablets taking 1 tablet EVER DAY   Labwork: None ordered  Testing/Procedures: None ordered  Follow-Up: Your physician recommends that you schedule a follow-up appointment in: 1 MONTH WITH DR. Mayford KnifeURNER

## 2015-04-24 NOTE — Progress Notes (Signed)
Cardiology Office Note   Date:  04/24/2015   ID:  ARTRICE KRAKER, DOB December 09, 1940, MRN [id number]  PCP:  Laurena Slimmer, MD  Cardiologist:   Dr. Armanda Magic  Chief Complaint: shortness of breath    History of Present Illness: Lisa [name] is a 74 y.o. female who presents for  One-month follow-up. She has a history of CAD, hypertension, dyslipidemia, diastolic dysfunction, chronic diastolic CHF , OSA on CPAP but not using. She saw Dr. Mayford Knife last month and was complaining of increase dyspnea on exertion. She was found to be in new atrial fibrillation with RVR of unknown duration.CHADSVASC=7. She was placed on Eliquis 5 mg twice a day and Toprol was increased to 25 mg daily. 2-D echo was ordered showing LAD to be severely dilated with elevated LV filling pressures moderate TR,  RVSP  Is 39 mmHg , EF 50-55%, LVH.  Lasix was increased to 40 mg daily for 3 days then back to 20 mg daily.Dr. Mayford Knife.Labs are stable.   Patient comes in today stating she never got her Eliquis filled. Insurance said they would pay for it but then she went to the pharmacy and they didn't have it. She never returned for it. She took the 40 mg of Lasix for 3 days and said it helped but now she complains of the same dyspnea on exertion with little activity. She can feel her heart beat irregular that doesn't think it's going fast.    Past Medical History  Diagnosis Date  . Diabetes mellitus without complication (HCC)   . Hypertension   . Chronic diastolic CHF (congestive heart failure) (HCC)   . RBBB   . Dyslipidemia   . Morbid obesity (HCC)   . Diastolic dysfunction   . Coronary artery disease     s/p CABG  . Hyperlipidemia   . OSA (obstructive sleep apnea)     mild OSA with AHI 7.15 now on CPAP at 16cm H2O    Past Surgical History  Procedure Laterality Date  . Stents      cardiac  . Cardiac surgery    . Knee surgery      bilateral  . Back surgery    . Rotator cuff surgery    . Coronary artery  bypass graft    . Cardiac catheterization    . Orif ankle fracture Right 08/19/2014    Procedure: OPEN REDUCTION INTERNAL FIXATION (ORIF) ANKLE FRACTURE;  Surgeon: Cammy Copa, MD;  Location: [facility] OR;  Service: Orthopedics;  Laterality: Right;     Current Outpatient Prescriptions  Medication Sig Dispense Refill  . albuterol-ipratropium (COMBIVENT) 18-103 MCG/ACT inhaler Inhale 2 puffs into the lungs every 6 (six) hours as needed for wheezing or shortness of breath.     . allopurinol (ZYLOPRIM) 100 MG tablet Take 100 mg by mouth daily.    Marland Kitchen aspirin EC 81 MG tablet Take 1 tablet (81 mg total) by mouth daily. 90 tablet 3  . buPROPion (WELLBUTRIN XL) 150 MG 24 hr tablet Take 150 mg by mouth daily.    . clotrimazole-betamethasone (LOTRISONE) lotion Apply 1 application topically 2 (two) times daily. To rash at breast and groin area as directed    . dexamethasone (DECADRON) 4 MG tablet Take 4 mg by mouth daily.     . ergocalciferol (VITAMIN D2) 50000 UNITS capsule Take 50,000 Units by mouth 2 (two) times a week. Patient takes every Tuesday and Saturday    . esomeprazole (NEXIUM) 40 MG capsule Take 40 mg  by mouth daily before breakfast.    . fluticasone (FLOVENT HFA) 220 MCG/ACT inhaler Inhale 1-2 puffs into the lungs 2 (two) times daily.    . furosemide (LASIX) 20 MG tablet Take 1 tablet (20 mg total) by mouth daily. Take 40 mg by mouth on 09/01/14, then take 20 mg every other day. 90 tablet 3  . gabapentin (NEURONTIN) 300 MG capsule Take 300 mg by mouth 3 (three) times daily.    Marland Kitchen. HYDROcodone-acetaminophen (NORCO) 10-325 MG tablet     . imipramine (TOFRANIL) 10 MG tablet Take 50 mg by mouth at bedtime.    . metoprolol succinate (TOPROL-XL) 25 MG 24 hr tablet Take 1 tablet (25 mg total) by mouth daily. 90 tablet 3  . mupirocin cream (BACTROBAN) 2 % Apply 1 application topically daily.    Marland Kitchen. nystatin-triamcinolone (MYCOLOG II) cream Apply 1 application topically 2 (two) times daily. To rash at  groin and breast area as directed    . simvastatin (ZOCOR) 10 MG tablet TAKE 1 TABLET BY MOUTH EVERY NIGHT AT BEDTIME 30 tablet 6  . potassium chloride SA (KLOR-CON M20) 20 MEQ tablet Take 1 tablet (20 mEq total) by mouth daily. 90 tablet 3   No current facility-administered medications for this visit.    Allergies:   Review of patient's allergies indicates no known allergies.    Social History:  The patient  reports that she has quit smoking. She does not have any smokeless tobacco history on file. She reports that she does not drink alcohol or use illicit drugs.   Family History:  The patient's   family history includes Heart attack in her mother; Heart disease in her mother; Stomach cancer in her father.    ROS:  Please see the history of present illness.   Otherwise, review of systems are positive for none.   All other systems are reviewed and negative.    PHYSICAL EXAM: VS:  BP 122/74 mmHg  Pulse 73  Ht 5\' 2"  (1.575 m)  Wt 243 lb 6.4 oz (110.406 kg)  BMI 44.51 kg/m2 , BMI Body mass index is 44.51 kg/(m^2). GEN: Well nourished, well developed, in no acute distress Neck: no JVD, HJR, carotid bruits, or masses Cardiac:  Irregular irregular no murmurs,gallop, rubs, thrill or heave,  Respiratory:   Decreased breath sounds butclear to auscultation bilaterally, normal work of breathing GI: soft, nontender, nondistended, + BS MS: no deformity or atrophy Extremities: plus 1 edema bilaterally,without cyanosis, clubbing, good distal pulses bilaterally.  Skin: warm and dry, no rash Neuro:  Strength and sensation are intact    EKG:  EKG is ordered today. The ekg ordered today demonstrates  Atrial fibrillation with controlled ventricular rate at 77 bpm  Recent Labs: 03/29/2015: ALT 15; Hemoglobin 11.6*; Platelets 191.0; Pro B Natriuretic peptide (BNP) 436.0*; TSH 2.36 04/06/2015: BUN 19; Creatinine, Ser 1.19; Potassium 3.8; Sodium 141    Lipid Panel    Component Value Date/Time    CHOL 145 03/29/2015 1157   TRIG 135.0 03/29/2015 1157   HDL 52.80 03/29/2015 1157   CHOLHDL 3 03/29/2015 1157   VLDL 27.0 03/29/2015 1157   LDLCALC 65 03/29/2015 1157      Wt Readings from Last 3 Encounters:  04/24/15 243 lb 6.4 oz (110.406 kg)  03/21/15 226 lb (102.513 kg)  09/20/14 247 lb 12.8 oz (112.401 kg)      Other studies Reviewed: Additional studies/ records that were reviewed today include and review of the records demonstrates:  2-D echo  03/29/15 Study Conclusions  - Technical notes: Technically Difficult Study due to large body   habitus. - Left ventricle: The cavity size was normal. Wall thickness was   increased in a pattern of moderate LVH. Systolic function was   normal. The estimated ejection fraction was in the range of 50%   to 55%. Incoordinate septal motion. The study is not technically   sufficient to allow evaluation of LV diastolic function. - Aortic valve: Sclerosis without stenosis. There was no   regurgitation. - Mitral valve: Calcified annulus. There was mild regurgitation. - Left atrium: Severely dilated at 63 ml/m2. - Right ventricle: The cavity size was mildly dilated. Systolic   function is reduced. Lateral annulus peak S velocity: 7.02 cm/s. - Right atrium: The atrium was mildly dilated. - Atrial septum: Bows from left to right, suggesting elevated LA   pressure. - Tricuspid valve: There was moderate regurgitation. - Pulmonary arteries: PA peak pressure: 39 mm Hg (S). - Inferior vena cava: The vessel was normal in size. The   respirophasic diameter changes were in the normal range (= 50%),   consistent with normal central venous pressure.  Impressions:  - Compared to a prior echo in 2015, the LA is now severely dilated.   There is elevated LV filling pressure, moderate TR and RVSP is 39   mmHg. A-fb is now present.    ASSESSMENT AND PLAN:  Atrial fibrillation [facility])  Patient remains in atrial fibrillation. Her rate is better  controlled on increased Toprol. Unfortunately she never got the Eliquis  Filled social can be set up for cardioversion.  I explained in detail her risk of stroke.She is going to the pharmacy after this visit to get the medication. Follow-up with Dr. Mayford Knife in one month.  Chronic diastolic CHF (congestive heart failure)  Patient continues to have moderate dyspnea on exertion with little activity. Suspect it's due to the atrial fibrillation. Will increase Lasix to 40 mg daily add potassium 20 mEq daily.  CAD (coronary artery disease)  Stable without angina    Signed, Jacolyn Reedy, PA-C  04/24/2015 12:34 PM    [facility] [address] [place], [place], [place]  [address] Phone: [phone]; Fax: [phone]

## 2015-04-24 NOTE — Assessment & Plan Note (Signed)
Patient continues to have moderate dyspnea on exertion with little activity. Suspect it's due to the atrial fibrillation. Will increase Lasix to 40 mg daily add potassium 20 mEq daily.

## 2015-04-24 NOTE — Assessment & Plan Note (Addendum)
Patient remains in atrial fibrillation. Her rate is better controlled on increased Toprol. Unfortunately she never got the Eliquis  Filled social can be set up for cardioversion.  I explained in detail her risk of stroke.She is going to the pharmacy after this visit to get the medication. Follow-up with Dr. Mayford Knifeurner in one month.

## 2015-04-24 NOTE — Assessment & Plan Note (Signed)
Stable without angina 

## 2015-06-13 ENCOUNTER — Telehealth: Payer: Self-pay | Admitting: *Deleted

## 2015-06-13 DIAGNOSIS — I5032 Chronic diastolic (congestive) heart failure: Secondary | ICD-10-CM

## 2015-06-13 DIAGNOSIS — I5189 Other ill-defined heart diseases: Secondary | ICD-10-CM

## 2015-06-13 DIAGNOSIS — R0602 Shortness of breath: Secondary | ICD-10-CM

## 2015-06-13 NOTE — Telephone Encounter (Signed)
Patient st she was able to get Eliquis approved so she has been taking. Med list updated.  Patient also requesting new Rx for motorized wheelchair. She was given an Rx at her last OV with Dr. Mayford Knifeurner but the order was not for a motorized unit.

## 2015-06-13 NOTE — Telephone Encounter (Signed)
Ok to get motorized wheelchair

## 2015-06-13 NOTE — Telephone Encounter (Signed)
Patient left a voicemail on the refill line requesting a call back to discuss the eliquis. The call back number that she provided was [phone]. Thanks, MI

## 2015-06-14 NOTE — Telephone Encounter (Signed)
[facility]

## 2015-06-28 ENCOUNTER — Encounter: Payer: Self-pay | Admitting: *Deleted

## 2015-07-13 ENCOUNTER — Ambulatory Visit (INDEPENDENT_AMBULATORY_CARE_PROVIDER_SITE_OTHER): Payer: Medicare Other | Admitting: Cardiology

## 2015-07-13 ENCOUNTER — Encounter: Payer: Self-pay | Admitting: Cardiology

## 2015-07-13 VITALS — BP 130/100 | HR 91 | Ht 62.0 in | Wt 236.4 lb

## 2015-07-13 DIAGNOSIS — I2510 Atherosclerotic heart disease of native coronary artery without angina pectoris: Secondary | ICD-10-CM | POA: Diagnosis not present

## 2015-07-13 DIAGNOSIS — I10 Essential (primary) hypertension: Secondary | ICD-10-CM

## 2015-07-13 DIAGNOSIS — I480 Paroxysmal atrial fibrillation: Secondary | ICD-10-CM

## 2015-07-13 DIAGNOSIS — I5032 Chronic diastolic (congestive) heart failure: Secondary | ICD-10-CM | POA: Diagnosis not present

## 2015-07-13 DIAGNOSIS — I493 Ventricular premature depolarization: Secondary | ICD-10-CM

## 2015-07-13 DIAGNOSIS — E785 Hyperlipidemia, unspecified: Secondary | ICD-10-CM

## 2015-07-13 DIAGNOSIS — G4733 Obstructive sleep apnea (adult) (pediatric): Secondary | ICD-10-CM

## 2015-07-13 DIAGNOSIS — I2583 Coronary atherosclerosis due to lipid rich plaque: Secondary | ICD-10-CM

## 2015-07-13 NOTE — Progress Notes (Signed)
                Cardiology Office Note   Date:  07/13/2015   ID:  Lisa [name], DOB 07/21/1940, MRN [id number]  PCP:  CLARK,[name], MD    No chief complaint on file.     History of Present Illness: Lisa [name] is a 74 y.o. female with a history of CAD, HTN, dyslipidemia, diastolic dysfunction, chronic diastolic CHF.  Since I saw her last she had a fall.  She has a special seat on her toilet and when she say on it it fell off and she fell with it.  She has been out of her BP medicine for the past week.  She has some sharp pains over her old sternal incision and is not c/w her prior angina.  She denies any anginal chest pain, palpitations, or syncope. She has history of chronic LE edema but that has resolved.  She has mild chronic DOE which is stable.    She has been using her CPAP without any problems.  She is now using her device nightly and has no problems with it. She feels rested in the am.     Past Medical History  Diagnosis Date  . Diabetes mellitus without complication (HCC)   . Hypertension   . Chronic diastolic CHF (congestive heart failure) (HCC)   . RBBB   . Dyslipidemia   . Morbid obesity (HCC)   . Diastolic dysfunction   . Coronary artery disease     s/p CABG  . Hyperlipidemia   . OSA (obstructive sleep apnea)     mild OSA with AHI 7.15 now on CPAP at 16cm H2O    Past Surgical History  Procedure Laterality Date  . Coronary angioplasty with stent placement    . Cardiac surgery    . Knee surgery Bilateral   . Back surgery    . Rotator cuff surgery    . Coronary artery bypass graft    . Cardiac catheterization    . Orif ankle fracture Right 08/19/2014    Procedure: OPEN REDUCTION INTERNAL FIXATION (ORIF) ANKLE FRACTURE;  Surgeon: Gregory Scott Dean, MD;  Location: [facility] OR;  Service: Orthopedics;  Laterality: Right;     Current Outpatient Prescriptions  Medication Sig Dispense Refill  . albuterol-ipratropium (COMBIVENT) 18-103 MCG/ACT inhaler  Inhale 2 puffs into the lungs every 6 (six) hours as needed for wheezing or shortness of breath.     . allopurinol (ZYLOPRIM) 100 MG tablet Take 100 mg by mouth daily.    . apixaban (ELIQUIS) 5 MG TABS tablet Take 5 mg by mouth 2 (two) times daily.    . aspirin EC 81 MG tablet Take 1 tablet (81 mg total) by mouth daily. 90 tablet 3  . buPROPion (WELLBUTRIN XL) 150 MG 24 hr tablet Take 150 mg by mouth daily.    . clotrimazole-betamethasone (LOTRISONE) lotion Apply 1 application topically 2 (two) times daily. To rash at breast and groin area as directed    . dexamethasone (DECADRON) 4 MG tablet Take 4 mg by mouth daily.     . ergocalciferol (VITAMIN D2) 50000 UNITS capsule Take 50,000 Units by mouth 2 (two) times a week. Patient takes every Tuesday and Saturday    . esomeprazole (NEXIUM) 40 MG capsule Take 40 mg by mouth daily before breakfast.    . etodolac (LODINE) 400 MG tablet Take 400 mg by mouth 2 (two) times daily.    . fluticasone (FLOVENT HFA)   220 MCG/ACT inhaler Inhale 1-2 puffs into the lungs 2 (two) times daily.    . furosemide (LASIX) 20 MG tablet Take 1 tablet (20 mg total) by mouth daily. 90 tablet 3  . gabapentin (NEURONTIN) 300 MG capsule Take 300 mg by mouth 3 (three) times daily.    . HYDROcodone-acetaminophen (NORCO) 10-325 MG tablet     . imipramine (TOFRANIL) 10 MG tablet Take 50 mg by mouth at bedtime.    . metoprolol succinate (TOPROL-XL) 25 MG 24 hr tablet Take 1 tablet (25 mg total) by mouth daily. 90 tablet 3  . mupirocin cream (BACTROBAN) 2 % Apply 1 application topically daily.    . nystatin-triamcinolone (MYCOLOG II) cream Apply 1 application topically 2 (two) times daily. To rash at groin and breast area as directed    . potassium chloride SA (KLOR-CON M20) 20 MEQ tablet Take 1 tablet (20 mEq total) by mouth daily. 90 tablet 3  . simvastatin (ZOCOR) 10 MG tablet TAKE 1 TABLET BY MOUTH EVERY NIGHT AT BEDTIME 30 tablet 6   No current facility-administered medications  for this visit.    Allergies:   Review of patient's allergies indicates no known allergies.    Social History:  The patient  reports that she has quit smoking. She does not have any smokeless tobacco history on file. She reports that she does not drink alcohol or use illicit drugs.   Family History:  The patient's family history includes Heart attack in her mother; Heart disease in her mother; Stomach cancer in her father.    ROS:  Please see the history of present illness.   Otherwise, review of systems are positive for none.   All other systems are reviewed and negative.    PHYSICAL EXAM: VS:  BP 130/100 mmHg  Pulse 91  Ht 5' 2" (1.575 m)  Wt 236 lb 6.4 oz (107.23 kg)  BMI 43.23 kg/m2  SpO2 98% , BMI Body mass index is 43.23 kg/(m^2). GEN: Well nourished, well developed, in no acute distress HEENT: normal Neck: no JVD, carotid bruits, or masses Cardiac: irregularly irregular; no murmurs, rubs, or gallops,no edema  Respiratory:  clear to auscultation bilaterally, normal work of breathing GI: soft, nontender, nondistended, + BS MS: no deformity or atrophy Skin: warm and dry, no rash Neuro:  Strength and sensation are intact Psych: euthymic mood, full affect   EKG:  EKG is not ordered today.    Recent Labs: 03/29/2015: ALT 15; Hemoglobin 11.6*; Platelets 191.0; Pro B Natriuretic peptide (BNP) 436.0*; TSH 2.36 04/06/2015: BUN 19; Creatinine, Ser 1.19; Potassium 3.8; Sodium 141    Lipid Panel    Component Value Date/Time   CHOL 145 03/29/2015 1157   TRIG 135.0 03/29/2015 1157   HDL 52.80 03/29/2015 1157   CHOLHDL 3 03/29/2015 1157   VLDL 27.0 03/29/2015 1157   LDLCALC 65 03/29/2015 1157      Wt Readings from Last 3 Encounters:  07/13/15 236 lb 6.4 oz (107.23 kg)  04/24/15 243 lb 6.4 oz (110.406 kg)  03/21/15 226 lb (102.513 kg)    ASSESSMENT AND PLAN:  1. ASCAD stable with no angina - Not on ASA due to  Eliquis 2. HTN - poorly controlled but has run out of  her BP meds a year ago - continue Metoprolol - I will give her a refill 4. Dyslipidemia - continue simvastatin. LDL at goal.  5. Chronic diastolic CHF - appears compensated on exam. - continue beta blocker and Lasix   6.   OSA now on CPAP - her d/l today showed an AHI of 1.2/hr on 16cm H2O but only 54% compliant in using more than 4 hours nightly.    7. PVC's - asymptomatic on BB  8. New onset atrial fibrillation with RVR of unknown duration.HR controlled on BB. This patients CHA2DS2-VASc Score and unadjusted Ischemic Stroke Rate (% per year) is equal to 9.6% stroke rate/year from a score of 7. Above score calculated as 1 point each if present [CHF, HTN, DM, Vascular=MI/PAD/Aortic Plaque, Age if 65-74, or Female]. Above score calculated as 2 points each if present [Age > 75, or Stroke/TIA/TE]. Continue Eliquis 5mg BID.  She missed a few doses of Eliquis last month but cannot remember when that was.  She has not missed any in the past week.  We will start 3 weeks from today to set up DCCV.   Current medicines are reviewed at length with the patient today.  The patient does not have concerns regarding medicines.  The following changes have been made:  no change  Labs/ tests ordered today: See above Assessment and Plan No orders of the defined types were placed in this encounter.     Disposition:   FU with me in 6 months  Signed, TURNER,[name] R, MD  07/13/2015 1:51 PM    [facility] [address], [place], [place]  [address] Phone: [phone]; Fax: [phone]    

## 2015-07-13 NOTE — Patient Instructions (Addendum)
Medication Instructions:  Your physician recommends that you continue on your current medications as directed. Please refer to the Current Medication list given to you today.   Labwork: None  Testing/Procedures: Your physician has recommended that you have a Cardioversion (DCCV) in three weeks. Electrical Cardioversion uses a jolt of electricity to your heart either through paddles or wired patches attached to your chest. This is a controlled, usually prescheduled, procedure. Defibrillation is done under light anesthesia in the hospital, and you usually go home the day of the procedure. This is done to get your heart back into a normal rhythm. You are not awake for the procedure. Please see the instruction sheet given to you today.  Follow-Up: Your physician wants you to follow-up in: 6 months with Dr. Mayford Knife. You will receive a reminder letter in the mail two months in advance. If you don't receive a letter, please call our office to schedule the follow-up appointment.   Any Other Special Instructions Will Be Listed Below (If Applicable).  Electrical Cardioversion Electrical cardioversion is the delivery of a jolt of electricity to change the rhythm of the heart. Sticky patches or metal paddles are placed on the chest to deliver the electricity from a device. This is done to restore a normal rhythm. A rhythm that is too fast or not regular keeps the heart from pumping well. Electrical cardioversion is done in an emergency if:   There is low or no blood pressure as a result of the heart rhythm.   Normal rhythm must be restored as fast as possible to protect the brain and heart from further damage.   It may save a life. Cardioversion may be done for heart rhythms that are not immediately life threatening, such as atrial fibrillation or flutter, in which:   The heart is beating too fast or is not regular.   Medicine to change the rhythm has not worked.   It is safe to wait in order to  allow time for preparation.  Symptoms of the abnormal rhythm are bothersome.  The risk of stroke and other serious problems can be reduced. LET [facility] CARE PROVIDER KNOW ABOUT:   Any allergies you have.  All medicines you are taking, including vitamins, herbs, eye drops, creams, and over-the-counter medicines.  Previous problems you or members of your family have had with the use of anesthetics.   Any blood disorders you have.   Previous surgeries you have had.   Medical conditions you have. RISKS AND COMPLICATIONS  Generally, this is a safe procedure. However, problems can occur and include:   Breathing problems related to the anesthetic used.  A blood clot that breaks free and travels to other parts of your body. This could cause a stroke or other problems. The risk of this is lowered by use of blood-thinning medicine (anticoagulant) prior to the procedure.  Cardiac arrest (rare). BEFORE THE PROCEDURE   You may have tests to detect blood clots in your heart and to evaluate heart function.  You may start taking anticoagulants so your blood does not clot as easily.   Medicines may be given to help stabilize your heart rate and rhythm. PROCEDURE  You will be given medicine through an IV tube to reduce discomfort and make you sleepy (sedative).   An electrical shock will be delivered. AFTER THE PROCEDURE Your heart rhythm will be watched to make sure it does not change.    This information is not intended to replace advice given to you  by your health care provider. Make sure you discuss any questions you have with your health care provider.   Document Released: 06/14/2002 Document Revised: 07/15/2014 Document Reviewed: 01/06/2013 Elsevier Interactive Patient Education [employer]2016 [employer]vier Inc.      If you need a refill on your cardiac medications before your next appointment, please call your pharmacy.

## 2015-07-28 ENCOUNTER — Telehealth: Payer: Self-pay

## 2015-07-28 DIAGNOSIS — I4891 Unspecified atrial fibrillation: Secondary | ICD-10-CM

## 2015-07-28 DIAGNOSIS — Z01812 Encounter for preprocedural laboratory examination: Secondary | ICD-10-CM

## 2015-07-28 NOTE — Telephone Encounter (Signed)
Agree with note by Katy Kemp, RN 

## 2015-07-28 NOTE — Telephone Encounter (Signed)
Per Dr. Norris Cross 1/5 OV note:  This patients CHA2DS2-VASc Score and unadjusted Ischemic Stroke Rate (% per year) is equal to 9.6% stroke rate/year from a score of 7. Above score calculated as 1 point each if present [CHF, HTN, DM, Vascular=MI/PAD/Aortic Plaque, Age if 65-74, or Female]. Above score calculated as 2 points each if present [Age > 75, or Stroke/TIA/TE]. Continue Eliquis  BID. She missed a few doses of Eliquis last month but cannot remember when that was. She has not missed any in the past week. We will start 3 weeks from today to set up DCCV.   Confirmed with patient she has not missed any Eliquis doses. Scheduled patient for DCCV on 1/26 with Dr. Elease Hashimoto. Patient to have labs drawn and pick up instruction letter 1/23. Patient agrees with treatment plan.

## 2015-07-29 NOTE — Telephone Encounter (Signed)
Please call patient to verify she has not missed any doses of Eliquis in the past 3 weeks

## 2015-07-31 ENCOUNTER — Other Ambulatory Visit (INDEPENDENT_AMBULATORY_CARE_PROVIDER_SITE_OTHER): Payer: Medicare Other | Admitting: *Deleted

## 2015-07-31 ENCOUNTER — Telehealth: Payer: Self-pay | Admitting: Cardiology

## 2015-07-31 DIAGNOSIS — I4891 Unspecified atrial fibrillation: Secondary | ICD-10-CM | POA: Diagnosis not present

## 2015-07-31 DIAGNOSIS — Z01812 Encounter for preprocedural laboratory examination: Secondary | ICD-10-CM | POA: Diagnosis not present

## 2015-07-31 LAB — CBC WITH DIFFERENTIAL/PLATELET
BASOS ABS: 0.1 10*3/uL (ref 0.0–0.1)
Basophils Relative: 1 % (ref 0–1)
EOS ABS: 0.1 10*3/uL (ref 0.0–0.7)
Eosinophils Relative: 2 % (ref 0–5)
HEMATOCRIT: 32.6 % — AB (ref 36.0–46.0)
Hemoglobin: 10.7 g/dL — ABNORMAL LOW (ref 12.0–15.0)
LYMPHS ABS: 1.5 10*3/uL (ref 0.7–4.0)
LYMPHS PCT: 29 % (ref 12–46)
MCH: 31.8 pg (ref 26.0–34.0)
MCHC: 32.8 g/dL (ref 30.0–36.0)
MCV: 97 fL (ref 78.0–100.0)
MONOS PCT: 9 % (ref 3–12)
MPV: 9.7 fL (ref 8.6–12.4)
Monocytes Absolute: 0.5 10*3/uL (ref 0.1–1.0)
NEUTROS PCT: 59 % (ref 43–77)
Neutro Abs: 3.1 10*3/uL (ref 1.7–7.7)
PLATELETS: 191 10*3/uL (ref 150–400)
RBC: 3.36 MIL/uL — ABNORMAL LOW (ref 3.87–5.11)
RDW: 14.6 % (ref 11.5–15.5)
WBC: 5.3 10*3/uL (ref 4.0–10.5)

## 2015-07-31 LAB — BASIC METABOLIC PANEL
BUN: 24 mg/dL (ref 7–25)
CO2: 24 mmol/L (ref 20–31)
CREATININE: 1.15 mg/dL — AB (ref 0.60–0.93)
Calcium: 8.8 mg/dL (ref 8.6–10.4)
Chloride: 106 mmol/L (ref 98–110)
GLUCOSE: 116 mg/dL — AB (ref 65–99)
Potassium: 4.7 mmol/L (ref 3.5–5.3)
SODIUM: 139 mmol/L (ref 135–146)

## 2015-07-31 NOTE — Addendum Note (Signed)
Addended by: Tonita Phoenix on: 07/31/2015 10:35 AM   Modules accepted: Orders

## 2015-07-31 NOTE — Telephone Encounter (Signed)
New message      Pt was here for lab work but forgot to get instructions regarding her upcoming procedure.  Please call

## 2015-07-31 NOTE — Telephone Encounter (Signed)
Called patient and reviewed cardioversion instructions.

## 2015-08-01 LAB — APTT: APTT: 39 s — AB (ref 24–37)

## 2015-08-01 LAB — PROTIME-INR
INR: 1.46 (ref <1.50)
Prothrombin Time: 17.9 seconds — ABNORMAL HIGH (ref 11.6–15.2)

## 2015-08-01 NOTE — Telephone Encounter (Signed)
Confirmed with patient that she has not missed a dose in the last 3 weeks.  Verified time and date of DCCV. Patient grateful for call.

## 2015-08-03 ENCOUNTER — Encounter (HOSPITAL_COMMUNITY)
Admission: RE | Disposition: A | Discharge status: Home / Self Care | Payer: Self-pay | Source: Ambulatory Visit | Attending: Cardiovascular Disease

## 2015-08-03 ENCOUNTER — Ambulatory Visit (HOSPITAL_COMMUNITY): Payer: Medicare Other | Admitting: Anesthesiology

## 2015-08-03 ENCOUNTER — Ambulatory Visit (HOSPITAL_COMMUNITY)
Admission: RE | Admit: 2015-08-03 | Discharge: 2015-08-03 | Disposition: A | Discharge status: Home / Self Care | Payer: Medicare Other | Source: Ambulatory Visit | Attending: Cardiovascular Disease | Admitting: Cardiovascular Disease

## 2015-08-03 ENCOUNTER — Encounter (HOSPITAL_COMMUNITY): Payer: Self-pay | Admitting: *Deleted

## 2015-08-03 ENCOUNTER — Telehealth: Payer: Self-pay

## 2015-08-03 DIAGNOSIS — G4733 Obstructive sleep apnea (adult) (pediatric): Secondary | ICD-10-CM | POA: Diagnosis not present

## 2015-08-03 DIAGNOSIS — E785 Hyperlipidemia, unspecified: Secondary | ICD-10-CM | POA: Diagnosis not present

## 2015-08-03 DIAGNOSIS — Z955 Presence of coronary angioplasty implant and graft: Secondary | ICD-10-CM | POA: Insufficient documentation

## 2015-08-03 DIAGNOSIS — Z7901 Long term (current) use of anticoagulants: Secondary | ICD-10-CM | POA: Diagnosis not present

## 2015-08-03 DIAGNOSIS — I4891 Unspecified atrial fibrillation: Secondary | ICD-10-CM | POA: Diagnosis not present

## 2015-08-03 DIAGNOSIS — J45909 Unspecified asthma, uncomplicated: Secondary | ICD-10-CM | POA: Insufficient documentation

## 2015-08-03 DIAGNOSIS — I5032 Chronic diastolic (congestive) heart failure: Secondary | ICD-10-CM | POA: Diagnosis not present

## 2015-08-03 DIAGNOSIS — Z87891 Personal history of nicotine dependence: Secondary | ICD-10-CM | POA: Insufficient documentation

## 2015-08-03 DIAGNOSIS — Z7982 Long term (current) use of aspirin: Secondary | ICD-10-CM | POA: Insufficient documentation

## 2015-08-03 DIAGNOSIS — Z6841 Body Mass Index (BMI) 40.0 and over, adult: Secondary | ICD-10-CM | POA: Insufficient documentation

## 2015-08-03 DIAGNOSIS — Z951 Presence of aortocoronary bypass graft: Secondary | ICD-10-CM | POA: Insufficient documentation

## 2015-08-03 DIAGNOSIS — E119 Type 2 diabetes mellitus without complications: Secondary | ICD-10-CM | POA: Insufficient documentation

## 2015-08-03 DIAGNOSIS — I4510 Unspecified right bundle-branch block: Secondary | ICD-10-CM | POA: Diagnosis not present

## 2015-08-03 DIAGNOSIS — I110 Hypertensive heart disease with heart failure: Secondary | ICD-10-CM | POA: Diagnosis not present

## 2015-08-03 DIAGNOSIS — K219 Gastro-esophageal reflux disease without esophagitis: Secondary | ICD-10-CM | POA: Insufficient documentation

## 2015-08-03 DIAGNOSIS — Z79899 Other long term (current) drug therapy: Secondary | ICD-10-CM | POA: Insufficient documentation

## 2015-08-03 DIAGNOSIS — I2510 Atherosclerotic heart disease of native coronary artery without angina pectoris: Secondary | ICD-10-CM | POA: Diagnosis not present

## 2015-08-03 HISTORY — PX: CARDIOVERSION: SHX1299

## 2015-08-03 LAB — GLUCOSE, CAPILLARY: GLUCOSE-CAPILLARY: 74 mg/dL (ref 65–99)

## 2015-08-03 SURGERY — CARDIOVERSION
Anesthesia: General

## 2015-08-03 MED ORDER — APIXABAN 5 MG PO TABS: 5.0000 mg | ORAL_TABLET | Freq: Two times a day (BID) | ORAL | Status: DC

## 2015-08-03 MED ORDER — SODIUM CHLORIDE 0.9 % IV SOLN
INTRAVENOUS | Status: DC
  Administered 2015-08-03: 12:00:00 via INTRAVENOUS

## 2015-08-03 MED ORDER — LIDOCAINE HCL (CARDIAC) 20 MG/ML IV SOLN
INTRAVENOUS | Status: DC | PRN
  Administered 2015-08-03: 60 mg via INTRATRACHEAL

## 2015-08-03 MED ORDER — PROPOFOL 10 MG/ML IV BOLUS
INTRAVENOUS | Status: DC | PRN
  Administered 2015-08-03: 70 mg via INTRAVENOUS

## 2015-08-03 MED ORDER — SIMVASTATIN 10 MG PO TABS: 10.0000 mg | ORAL_TABLET | Freq: Every day | ORAL | Status: DC

## 2015-08-03 NOTE — Anesthesia Postprocedure Evaluation (Signed)
Anesthesia Post Note  Patient: Lisa [name]  Procedure(s) Performed: Procedure(s) (LRB): CARDIOVERSION (N/A)  Patient location during evaluation: PACU Anesthesia Type: MAC Level of consciousness: awake and alert Pain management: pain level controlled Vital Signs Assessment: post-procedure vital signs reviewed and stable Respiratory status: spontaneous breathing, nonlabored ventilation, respiratory function stable and patient connected to nasal cannula oxygen Cardiovascular status: blood pressure returned to baseline and stable Postop Assessment: no signs of nausea or vomiting Anesthetic complications: no    Last Vitals:  Filed Vitals:   08/03/15 1018  BP: 161/95  Pulse: 69  Temp: 36.7 C  Resp: 20    Last Pain:  Filed Vitals:   08/03/15 1020  PainSc: 9                  Akia Desroches JENNETTE

## 2015-08-03 NOTE — Anesthesia Postprocedure Evaluation (Signed)
Anesthesia Post Note  Patient: Lisa [name]  Procedure(s) Performed: Procedure(s) (LRB): CARDIOVERSION (N/A)  Patient location during evaluation: Endoscopy Anesthesia Type: General Level of consciousness: awake, awake and alert and oriented Vital Signs Assessment: post-procedure vital signs reviewed and stable Respiratory status: spontaneous breathing Cardiovascular status: blood pressure returned to baseline Anesthetic complications: no    Last Vitals:  Filed Vitals:   08/03/15 1018  BP: 161/95  Pulse: 69  Temp: 36.7 C  Resp: 20    Last Pain:  Filed Vitals:   08/03/15 1020  PainSc: [address]

## 2015-08-03 NOTE — H&P (View-Only) (Signed)
Cardiology Office Note   Date:  07/13/2015   ID:  Lisa [name], DOB [birth year], MRN [id number]  PCP:  Laurena Slimmer, MD    No chief complaint on file.     History of Present Illness: Lisa [name] is a 75 y.o. female with a history of CAD, HTN, dyslipidemia, diastolic dysfunction, chronic diastolic CHF.  Since I saw her last she had a fall.  She has a special seat on her toilet and when she say on it it fell off and she fell with it.  She has been out of her BP medicine for the past week.  She has some sharp pains over her old sternal incision and is not c/w her prior angina.  She denies any anginal chest pain, palpitations, or syncope. She has history of chronic LE edema but that has resolved.  She has mild chronic DOE which is stable.    She has been using her CPAP without any problems.  She is now using her device nightly and has no problems with it. She feels rested in the am.     Past Medical History  Diagnosis Date  . Diabetes mellitus without complication (HCC)   . Hypertension   . Chronic diastolic CHF (congestive heart failure) (HCC)   . RBBB   . Dyslipidemia   . Morbid obesity (HCC)   . Diastolic dysfunction   . Coronary artery disease     s/p CABG  . Hyperlipidemia   . OSA (obstructive sleep apnea)     mild OSA with AHI 7.15 now on CPAP at 16cm H2O    Past Surgical History  Procedure Laterality Date  . Coronary angioplasty with stent placement    . Cardiac surgery    . Knee surgery Bilateral   . Back surgery    . Rotator cuff surgery    . Coronary artery bypass graft    . Cardiac catheterization    . Orif ankle fracture Right 08/19/2014    Procedure: OPEN REDUCTION INTERNAL FIXATION (ORIF) ANKLE FRACTURE;  Surgeon: Cammy Copa, MD;  Location: [facility] OR;  Service: Orthopedics;  Laterality: Right;     Current Outpatient Prescriptions  Medication Sig Dispense Refill  . albuterol-ipratropium (COMBIVENT) 18-103 MCG/ACT inhaler  Inhale 2 puffs into the lungs every 6 (six) hours as needed for wheezing or shortness of breath.     . allopurinol (ZYLOPRIM) 100 MG tablet Take 100 mg by mouth daily.    Marland Kitchen apixaban (ELIQUIS) 5 MG TABS tablet Take 5 mg by mouth 2 (two) times daily.    Marland Kitchen aspirin EC 81 MG tablet Take 1 tablet (81 mg total) by mouth daily. 90 tablet 3  . buPROPion (WELLBUTRIN XL) 150 MG 24 hr tablet Take 150 mg by mouth daily.    . clotrimazole-betamethasone (LOTRISONE) lotion Apply 1 application topically 2 (two) times daily. To rash at breast and groin area as directed    . dexamethasone (DECADRON) 4 MG tablet Take 4 mg by mouth daily.     . ergocalciferol (VITAMIN D2) 50000 UNITS capsule Take 50,000 Units by mouth 2 (two) times a week. Patient takes every Tuesday and Saturday    . esomeprazole (NEXIUM) 40 MG capsule Take 40 mg by mouth daily before breakfast.    . etodolac (LODINE) 400 MG tablet Take 400 mg by mouth 2 (two) times daily.    . fluticasone (FLOVENT HFA)  220 MCG/ACT inhaler Inhale 1-2 puffs into the lungs 2 (two) times daily.    . furosemide (LASIX) 20 MG tablet Take 1 tablet (20 mg total) by mouth daily. 90 tablet 3  . gabapentin (NEURONTIN) 300 MG capsule Take 300 mg by mouth 3 (three) times daily.    Marland Kitchen HYDROcodone-acetaminophen (NORCO) 10-325 MG tablet     . imipramine (TOFRANIL) 10 MG tablet Take 50 mg by mouth at bedtime.    . metoprolol succinate (TOPROL-XL) 25 MG 24 hr tablet Take 1 tablet (25 mg total) by mouth daily. 90 tablet 3  . mupirocin cream (BACTROBAN) 2 % Apply 1 application topically daily.    Marland Kitchen nystatin-triamcinolone (MYCOLOG II) cream Apply 1 application topically 2 (two) times daily. To rash at groin and breast area as directed    . potassium chloride SA (KLOR-CON M20) 20 MEQ tablet Take 1 tablet (20 mEq total) by mouth daily. 90 tablet 3  . simvastatin (ZOCOR) 10 MG tablet TAKE 1 TABLET BY MOUTH EVERY NIGHT AT BEDTIME 30 tablet 6   No current facility-administered medications  for this visit.    Allergies:   Review of patient's allergies indicates no known allergies.    Social History:  The patient  reports that she has quit smoking. She does not have any smokeless tobacco history on file. She reports that she does not drink alcohol or use illicit drugs.   Family History:  The patient's family history includes Heart attack in her mother; Heart disease in her mother; Stomach cancer in her father.    ROS:  Please see the history of present illness.   Otherwise, review of systems are positive for none.   All other systems are reviewed and negative.    PHYSICAL EXAM: VS:  BP 130/100 mmHg  Pulse 91  Ht  (1.575 m)  Wt 236 lb 6.4 oz (107.23 kg)  BMI 43.23 kg/m2  SpO2 98% , BMI Body mass index is 43.23 kg/(m^2). GEN: Well nourished, well developed, in no acute distress HEENT: normal Neck: no JVD, carotid bruits, or masses Cardiac: irregularly irregular; no murmurs, rubs, or gallops,no edema  Respiratory:  clear to auscultation bilaterally, normal work of breathing GI: soft, nontender, nondistended, + BS MS: no deformity or atrophy Skin: warm and dry, no rash Neuro:  Strength and sensation are intact Psych: euthymic mood, full affect   EKG:  EKG is not ordered today.    Recent Labs: 03/29/2015: ALT 15; Hemoglobin 11.6*; Platelets 191.0; Pro B Natriuretic peptide (BNP) 436.0*; TSH 2.36 04/06/2015: BUN 19; Creatinine, Ser 1.19; Potassium 3.8; Sodium 141    Lipid Panel    Component Value Date/Time   CHOL 145 03/29/2015 1157   TRIG 135.0 03/29/2015 1157   HDL 52.80 03/29/2015 1157   CHOLHDL 3 03/29/2015 1157   VLDL 27.0 03/29/2015 1157   LDLCALC 65 03/29/2015 1157      Wt Readings from Last 3 Encounters:  07/13/15 236 lb 6.4 oz (107.23 kg)  04/24/15 243 lb 6.4 oz (110.406 kg)  03/21/15 226 lb (102.513 kg)    ASSESSMENT AND PLAN:  1. ASCAD stable with no angina - Not on ASA due to  Eliquis 2. HTN - poorly controlled but has run out of  her BP meds a year ago - continue Metoprolol - I will give her a refill 4. Dyslipidemia - continue simvastatin. LDL at goal.  5. Chronic diastolic CHF - appears compensated on exam. - continue beta blocker and Lasix   6.  OSA now on CPAP - her d/l today showed an AHI of 1.2/hr on 16cm H2O but only 54% compliant in using more than 4 hours nightly.    7. PVC's - asymptomatic on BB  8. New onset atrial fibrillation with RVR of unknown duration.HR controlled on BB. This patients CHA2DS2-VASc Score and unadjusted Ischemic Stroke Rate (% per year) is equal to 9.6% stroke rate/year from a score of 7. Above score calculated as 1 point each if present [CHF, HTN, DM, Vascular=MI/PAD/Aortic Plaque, Age if 65-74, or Female]. Above score calculated as 2 points each if present [Age > 75, or Stroke/TIA/TE]. Continue Eliquis  BID.  She missed a few doses of Eliquis last month but cannot remember when that was.  She has not missed any in the past week.  We will start 3 weeks from today to set up DCCV.   Current medicines are reviewed at length with the patient today.  The patient does not have concerns regarding medicines.  The following changes have been made:  no change  Labs/ tests ordered today: See above Assessment and Plan No orders of the defined types were placed in this encounter.     Disposition:   FU with me in 6 months  Signed, Quintella Reichert, MD  07/13/2015 1:51 PM    [facility] [address] [place], [place], [place]  [address] Phone: [phone]; Fax: [phone]

## 2015-08-03 NOTE — Anesthesia Preprocedure Evaluation (Addendum)
Anesthesia Evaluation  Patient identified by MRN, date of birth, ID band Patient awake    Reviewed: Allergy & Precautions, NPO status , Patient's Chart, lab work & pertinent test results  Airway Mallampati: II  TM Distance: >3 FB Neck ROM: Full    Dental no notable dental hx. (+) Edentulous Upper, Edentulous Lower   Pulmonary shortness of breath and with exertion, asthma , sleep apnea and Continuous Positive Airway Pressure Ventilation , former smoker,    Pulmonary exam normal breath sounds clear to auscultation       Cardiovascular hypertension, Pt. on medications and Pt. on home beta blockers +CHF  Normal cardiovascular exam+ dysrhythmias  Rhythm:Regular Rate:Normal     Neuro/Psych    GI/Hepatic GERD  Medicated and Controlled,  Endo/Other  diabetes, Type 2  Renal/GU      Musculoskeletal   Abdominal   Peds  Hematology   Anesthesia Other Findings   Reproductive/Obstetrics                            Anesthesia Physical Anesthesia Plan  ASA: III  Anesthesia Plan: General   Post-op Pain Management:    Induction: Intravenous  Airway Management Planned: Mask  Additional Equipment:   Intra-op Plan:   Post-operative Plan:   Informed Consent:   Dental advisory given  Plan Discussed with: Anesthesiologist and Surgeon  Anesthesia Plan Comments:         Anesthesia Quick Evaluation

## 2015-08-03 NOTE — Telephone Encounter (Signed)
Per Dr. Elease Hashimoto, patient to STOP ASA and TOPROL. He also st she would like refills on her medications. Will call patient once she is discharged from hospital to order.

## 2015-08-03 NOTE — CV Procedure (Signed)
    Cardioversion Note  Lisa [name] [id number] 07-22-1940  Procedure: DC Cardioversion Indications: atrial fib   Procedure Details Consent: Obtained Time Out: Verified patient identification, verified procedure, site/side was marked, verified correct patient position, special equipment/implants available, Radiology Safety Procedures followed,  medications/allergies/relevent history reviewed, required imaging and test results available.  Performed  The patient has been on adequate anticoagulation.  The patient received IV Lidocaine 60 mg iv followed by Propofol 70 mg IV  for sedation.  Synchronous cardioversion was performed at 200  joules.  The cardioversion was successful     Complications: No apparent complications Patient did tolerate procedure well.   Vesta Mixer, Montez Hageman., MD, [facility] 08/03/2015, 12:30 PM

## 2015-08-03 NOTE — Interval H&P Note (Signed)
History and Physical Interval Note:  08/03/2015 11:38 AM  Lisa [name]  has presented today for surgery, with the diagnosis of AFIB  The various methods of treatment have been discussed with the patient and family. After consideration of risks, benefits and other options for treatment, the patient has consented to  Procedure(s): CARDIOVERSION (N/A) as a surgical intervention .  The patient's history has been reviewed, patient examined, no change in status, stable for surgery.  I have reviewed the patient's chart and labs.  Questions were answered to the patient's satisfaction.     [name], [name]

## 2015-08-03 NOTE — Discharge Instructions (Signed)
Monitored Anesthesia Care °Monitored anesthesia care is an anesthesia service for a medical procedure. Anesthesia is the loss of the ability to feel pain. It is produced by medicines called anesthetics. It may affect a small area of your body (local anesthesia), a large area of your body (regional anesthesia), or your entire body (general anesthesia). The need for monitored anesthesia care depends your procedure, your condition, and the potential need for regional or general anesthesia. It is often provided during procedures where:  °· General anesthesia may be needed if there are complications. This is because you need special care when you are under general anesthesia.   °· You will be under local or regional anesthesia. This is so that you are able to have higher levels of anesthesia if needed.   °· You will receive calming medicines (sedatives). This is especially the case if sedatives are given to put you in a semi-conscious state of relaxation (deep sedation). This is because the amount of sedative needed to produce this state can be hard to predict. Too much of a sedative can produce general anesthesia. °Monitored anesthesia care is performed by one or more health care providers who have special training in all types of anesthesia. You will need to meet with these health care providers before your procedure. During this meeting, they will ask you about your medical history. They will also give you instructions to follow. (For example, you will need to stop eating and drinking before your procedure. You may also need to stop or change medicines you are taking.) During your procedure, your health care providers will stay with you. They will:  °· Watch your condition. This includes watching your blood pressure, breathing, and level of pain.   °· Diagnose and treat problems that occur.   °· Give medicines if they are needed. These may include calming medicines (sedatives) and anesthetics.   °· Make sure you are  comfortable.   °Having monitored anesthesia care does not necessarily mean that you will be under anesthesia. It does mean that your health care providers will be able to manage anesthesia if you need it or if it occurs. It also means that you will be able to have a different type of anesthesia than you are having if you need it. When your procedure is complete, your health care providers will continue to watch your condition. They will make sure any medicines wear off before you are allowed to go home.  °  °This information is not intended to replace advice given to you by your health care provider. Make sure you discuss any questions you have with your health care provider. °  °Document Released: 03/20/2005 Document Revised: 07/15/2014 Document Reviewed: 08/05/2012 °Elsevier Interactive Patient Education ©2016 [employer] °Electrical Cardioversion, Care After °Refer to this sheet in the next few weeks. These instructions provide you with information on caring for yourself after your procedure. Your health care provider may also give you more specific instructions. Your treatment has been planned according to current medical practices, but problems sometimes occur. Call your health care provider if you have any problems or questions after your procedure. °WHAT TO EXPECT AFTER THE PROCEDURE °After your procedure, it is typical to have the following sensations: °· Some redness on the skin where the shocks were delivered. If this is tender, a sunburn lotion or hydrocortisone cream may help. °· Possible return of an abnormal heart rhythm within hours or days after the procedure. °HOME CARE INSTRUCTIONS °· Take medicines only as directed by your health care provider.   Be sure you understand how and when to take your medicine. °· Learn how to feel your pulse and check it often. °· Limit your activity for 48 hours after the procedure or as directed by your health care provider. °· Avoid or minimize caffeine and other  stimulants as directed by your health care provider. °SEEK MEDICAL CARE IF: °· You feel like your heart is beating too fast or your pulse is not regular. °· You have any questions about your medicines. °· You have bleeding that will not stop. °SEEK IMMEDIATE MEDICAL CARE IF: °· You are dizzy or feel faint. °· It is hard to breathe or you feel short of breath. °· There is a change in discomfort in your chest. °· Your speech is slurred or you have trouble moving an arm or leg on one side of your body. °· You get a serious muscle cramp that does not go away. °· Your fingers or toes turn cold or blue. °  °This information is not intended to replace advice given to you by your health care provider. Make sure you discuss any questions you have with your health care provider. °  °Document Released: 04/14/2013 Document Revised: 07/15/2014 Document Reviewed: 04/14/2013 °Elsevier Interactive Patient Education ©2016 [employer] ° °

## 2015-08-03 NOTE — Transfer of Care (Signed)
Immediate Anesthesia Transfer of Care Note  Patient: Lisa [name]  Procedure(s) Performed: Procedure(s): CARDIOVERSION (N/A)  Patient Location: Endoscopy Unit  Anesthesia Type:General  Level of Consciousness: awake, alert  and oriented  Airway & Oxygen Therapy: Patient Spontanous Breathing  Post-op Assessment: Report given to RN  Post vital signs: Reviewed and stable  Last Vitals:  Filed Vitals:   08/03/15 1018  BP: 161/95  Pulse: 69  Temp: 36.7 C  Resp: 20    Complications: No apparent anesthesia complications

## 2015-08-03 NOTE — Telephone Encounter (Signed)
Patient requests refills of Zocor and Eliquis.  Refills sent to pharmacy of choice. Patient wants to know when Dr. Mayford Knife wants to see her again post-procedure.  She understands she will get a call next week for an update.

## 2015-08-04 ENCOUNTER — Encounter (HOSPITAL_COMMUNITY): Payer: Self-pay | Admitting: Cardiovascular Disease

## 2015-08-06 NOTE — Telephone Encounter (Signed)
Needs to see extender in 2 weeks and me in 6 months

## 2015-08-07 ENCOUNTER — Other Ambulatory Visit: Payer: Self-pay | Admitting: Cardiology

## 2015-08-07 NOTE — Telephone Encounter (Signed)
Scheduled patient 2/15 with Nada Boozer, NP.  Recall in place for 6 month OV with Dr. Mayford Knife. Patient agrees with treatment plan.

## 2015-08-23 ENCOUNTER — Ambulatory Visit (INDEPENDENT_AMBULATORY_CARE_PROVIDER_SITE_OTHER): Payer: Medicare Other | Admitting: Cardiology

## 2015-08-23 ENCOUNTER — Encounter: Payer: Self-pay | Admitting: Cardiology

## 2015-08-23 VITALS — BP 142/80 | HR 77 | Ht 62.0 in | Wt 230.0 lb

## 2015-08-23 DIAGNOSIS — I493 Ventricular premature depolarization: Secondary | ICD-10-CM | POA: Diagnosis not present

## 2015-08-23 DIAGNOSIS — I2510 Atherosclerotic heart disease of native coronary artery without angina pectoris: Secondary | ICD-10-CM

## 2015-08-23 DIAGNOSIS — I480 Paroxysmal atrial fibrillation: Secondary | ICD-10-CM

## 2015-08-23 DIAGNOSIS — I2583 Coronary atherosclerosis due to lipid rich plaque: Secondary | ICD-10-CM

## 2015-08-23 DIAGNOSIS — R0602 Shortness of breath: Secondary | ICD-10-CM

## 2015-08-23 DIAGNOSIS — I4891 Unspecified atrial fibrillation: Secondary | ICD-10-CM

## 2015-08-23 DIAGNOSIS — G4733 Obstructive sleep apnea (adult) (pediatric): Secondary | ICD-10-CM

## 2015-08-23 NOTE — Patient Instructions (Signed)
Medication Instructions:  None  Labwork: None  Testing/Procedures: None  Follow-Up: Your physician recommends that you schedule a follow-up appointment in: July 2017 with Dr. Mayford Knife.   Any Other Special Instructions Will Be Listed Below (If Applicable).  Please follow up with Dr. Chestine Spore in regards to your Hemoglobin.   If you need a refill on your cardiac medications before your next appointment, please call your pharmacy.

## 2015-08-23 NOTE — Progress Notes (Signed)
Cardiology Office Note   Date:  08/23/2015   ID:  ZAHRIYAH JOO, DOB [birth year], MRN [id number]  PCP:  Laurena Slimmer, MD  Cardiologist:  Dr. Mayford Knife    Chief Complaint  Patient presents with  . Atrial Fibrillation      History of Present Illness: Lisa [name] is a 75 y.o. female who presents for post DCCV 08/03/15.  She has a history of CAD post CABG, HTN, dyslipidemia, diastolic dysfunction, chronic diastolic CHF. She denies any anginal chest pain, palpitations, or syncope. She has history of chronic LE edema but that has resolved. She has mild chronic DOE which is stable.  She has been using her CPAP without any problems. She is now using her device nightly and but is not doing well with the current mask.    She is tearful today due to Rt arm pain.  She has had rotator cuff surgery but now with freq pain.  She is followed by Dr. August Saucer.  On last labs her Hgb was decreased she will follow up with her PCP Dr. Chestine Spore.   She is maintaining SR with RBBB since DCCV.  She has no changes in her DOE with cardioversion.  She continues to take her Eliquis and has not missed a dose.      Past Medical History  Diagnosis Date  . Diabetes mellitus without complication (HCC)   . Hypertension   . Chronic diastolic CHF (congestive heart failure) (HCC)   . RBBB   . Dyslipidemia   . Morbid obesity (HCC)   . Diastolic dysfunction   . Coronary artery disease     s/p CABG  . Hyperlipidemia   . OSA (obstructive sleep apnea)     mild OSA with AHI 7.15 now on CPAP at 16cm H2O    Past Surgical History  Procedure Laterality Date  . Coronary angioplasty with stent placement    . Cardiac surgery    . Knee surgery Bilateral   . Back surgery    . Rotator cuff surgery    . Coronary artery bypass graft    . Cardiac catheterization    . Orif ankle fracture Right 08/19/2014    Procedure: OPEN REDUCTION INTERNAL FIXATION (ORIF) ANKLE FRACTURE;  Surgeon: Cammy Copa, MD;  Location:  [facility];  Service: Orthopedics;  Laterality: Right;  . Cardioversion N/A 08/03/2015    Procedure: CARDIOVERSION;  Surgeon: Vesta Mixer, MD;  Location: [facility] ENDOSCOPY;  Service: Cardiovascular;  Laterality: N/A;     Current Outpatient Prescriptions  Medication Sig Dispense Refill  . albuterol-ipratropium (COMBIVENT) 18-103 MCG/ACT inhaler Inhale 2 puffs into the lungs every 6 (six) hours as needed for wheezing or shortness of breath. Reported on 08/02/2015    . allopurinol (ZYLOPRIM) 100 MG tablet Take 100 mg by mouth daily.    Marland Kitchen apixaban (ELIQUIS) 5 MG TABS tablet Take 1 tablet (5 mg total) by mouth 2 (two) times daily. 180 tablet 3  . buPROPion (WELLBUTRIN XL) 150 MG 24 hr tablet Take 150 mg by mouth daily. Reported on 08/02/2015    . clotrimazole-betamethasone (LOTRISONE) lotion Apply 1 application topically 2 (two) times daily. To rash at breast and groin area as directed    . dexamethasone (DECADRON) 4 MG tablet Take 4 mg by mouth daily. Reported on 08/02/2015    . ergocalciferol (VITAMIN D2) 50000 UNITS capsule Take 50,000 Units by mouth 2 (two) times a week. Patient takes every Tuesday and Saturday    . escitalopram (LEXAPRO) 10  MG tablet Take 10 mg by mouth daily.    Marland Kitchen esomeprazole (NEXIUM) 40 MG capsule Take 40 mg by mouth daily before breakfast.    . etodolac (LODINE) 400 MG tablet Take 400 mg by mouth 2 (two) times daily.    . fluticasone (FLOVENT HFA) 220 MCG/ACT inhaler Inhale 1-2 puffs into the lungs 2 (two) times daily.    . furosemide (LASIX) 20 MG tablet Take 1 tablet (20 mg total) by mouth daily. 90 tablet 3  . gabapentin (NEURONTIN) 300 MG capsule Take 300 mg by mouth 3 (three) times daily.    Marland Kitchen HYDROcodone-acetaminophen (NORCO) 10-325 MG tablet Take 1 tablet by mouth every 6 (six) hours as needed for moderate pain. Reported on 08/02/2015    . imipramine (TOFRANIL) 10 MG tablet Take 50 mg by mouth at bedtime. Reported on 08/02/2015    . mupirocin cream (BACTROBAN) 2 % Apply 1  application topically daily.    Marland Kitchen nystatin-triamcinolone (MYCOLOG II) cream Apply 1 application topically 2 (two) times daily. To rash at groin and breast area as directed    . potassium chloride SA (KLOR-CON M20) 20 MEQ tablet Take 1 tablet (20 mEq total) by mouth daily. 90 tablet 3  . simvastatin (ZOCOR) 10 MG tablet Take 1 tablet (10 mg total) by mouth at bedtime. 90 tablet 3   No current facility-administered medications for this visit.    Allergies:   Review of patient's allergies indicates no known allergies.    Social History:  The patient  reports that she has quit smoking. She does not have any smokeless tobacco history on file. She reports that she does not drink alcohol or use illicit drugs.   Family History:  The patient's family history includes Heart attack in her mother; Heart disease in her mother; Stomach cancer in her father.    ROS:  General:no colds or fevers, no weight changes Skin:no rashes or ulcers HEENT:no blurred vision, no congestion CV:see HPI PUL:see HPI GI:no diarrhea constipation or melena, no indigestion GU:no hematuria, no dysuria MS:++ joint pain rt shoulder the worst, but tender everywhere. , no claudication Neuro:no syncope, no lightheadedness Endo:no diabetes, no thyroid disease  Wt Readings from Last 3 Encounters:  08/23/15 230 lb (104.327 kg)  08/03/15 236 lb (107.049 kg)  07/13/15 236 lb 6.4 oz (107.23 kg)     PHYSICAL EXAM: VS:  BP 142/80 mmHg  Pulse 77  Ht  (1.575 m)  Wt 230 lb (104.327 kg)  BMI 42.06 kg/m2  SpO2 96% , BMI Body mass index is 42.06 kg/(m^2). General:Pleasant affect, NAD Skin:Warm and dry, brisk capillary refill HEENT:normocephalic, sclera clear, mucus membranes moist Neck:supple, no JVD, no bruits  Heart:S1S2 RRR without murmur, gallup, rub or click Lungs:clear without rales, rhonchi, or wheezes ZOX:WRUE, non tender, + BS, do not palpate liver spleen or masses Ext:no lower ext edema, 2+ pedal pulses, 2+  radial pulses Neuro:alert and oriented X 3, MAE, follows commands, + facial symmetry    EKG:  EKG is ordered today. The ekg ordered today demonstrates SR with RBBB and LAFB, LVH. PVCs.    Recent Labs: 03/29/2015: ALT 15; Pro B Natriuretic peptide (BNP) 436.0*; TSH 2.36 07/31/2015: BUN 24; Creat 1.15*; Hemoglobin 10.7*; Platelets 191; Potassium 4.7; Sodium 139    Lipid Panel    Component Value Date/Time   CHOL 145 03/29/2015 1157   TRIG 135.0 03/29/2015 1157   HDL 52.80 03/29/2015 1157   CHOLHDL 3 03/29/2015 1157   VLDL 27.0 03/29/2015 1157  LDLCALC 65 03/29/2015 1157       Other studies Reviewed: Additional studies/ records that were reviewed today include: hospital  .   ASSESSMENT AND PLAN:  1.  Atrial fib on last visit she is on Eliquis for CHA2DS2-VASc Score and unadjusted Ischemic Stroke Rate (% per year) is equal to 9.6% stroke rate/year from a score of 7. Above score calculated as 1 point each if present [CHF, HTN, DM, Vascular=MI/PAD/Aortic Plaque, Age if 65-74, or Female]. Above score calculated as 2 points each if present [Age > 75, or Stroke/TIA/TE].    Now post DCCV 08/03/15 with 200 joules that was successful.  And maintaining SR.   2. CAD no angina no ASA due to eliquis.  3. HTN- controlled  4. OSA now on CPAP. Will help her with her mask.   5. Anemia on pre procedure lab work with HGb at 10.7  She denies any bloody stools or black stools.  She will see Dr. Chestine Spore her PCP in near future.     Current medicines are reviewed with the patient today.  The patient Has no concerns regarding medicines.  The following changes have been made:  See above Labs/ tests ordered today include:see above  Disposition:   FU:  see above  Signed, Leone Brand, NP  08/23/2015 1:45 PM    [facility] [address] [place], [place], [place]  [address]/ [address], [place] Phone: [phone]; Fax: [phone]    [phone]

## 2015-09-07 ENCOUNTER — Other Ambulatory Visit (INDEPENDENT_AMBULATORY_CARE_PROVIDER_SITE_OTHER): Payer: Medicare Other | Admitting: *Deleted

## 2015-09-07 DIAGNOSIS — I2583 Coronary atherosclerosis due to lipid rich plaque: Secondary | ICD-10-CM

## 2015-09-07 DIAGNOSIS — I10 Essential (primary) hypertension: Secondary | ICD-10-CM | POA: Diagnosis not present

## 2015-09-07 DIAGNOSIS — E785 Hyperlipidemia, unspecified: Secondary | ICD-10-CM

## 2015-09-07 DIAGNOSIS — I2510 Atherosclerotic heart disease of native coronary artery without angina pectoris: Secondary | ICD-10-CM

## 2015-09-07 LAB — CBC WITH DIFFERENTIAL/PLATELET
BASOS ABS: 0.1 10*3/uL (ref 0.0–0.1)
BASOS PCT: 1 % (ref 0–1)
Eosinophils Absolute: 0.1 10*3/uL (ref 0.0–0.7)
Eosinophils Relative: 1 % (ref 0–5)
HCT: 38.3 % (ref 36.0–46.0)
HEMOGLOBIN: 12.5 g/dL (ref 12.0–15.0)
LYMPHS ABS: 1.6 10*3/uL (ref 0.7–4.0)
Lymphocytes Relative: 19 % (ref 12–46)
MCH: 31.3 pg (ref 26.0–34.0)
MCHC: 32.6 g/dL (ref 30.0–36.0)
MCV: 95.8 fL (ref 78.0–100.0)
MONOS PCT: 8 % (ref 3–12)
MPV: 10.1 fL (ref 8.6–12.4)
Monocytes Absolute: 0.7 10*3/uL (ref 0.1–1.0)
NEUTROS ABS: 6.1 10*3/uL (ref 1.7–7.7)
NEUTROS PCT: 71 % (ref 43–77)
Platelets: 184 10*3/uL (ref 150–400)
RBC: 4 MIL/uL (ref 3.87–5.11)
RDW: 13.6 % (ref 11.5–15.5)
WBC: 8.6 10*3/uL (ref 4.0–10.5)

## 2015-09-07 LAB — HEMOGLOBIN A1C
HEMOGLOBIN A1C: 5.6 % (ref <5.7)
MEAN PLASMA GLUCOSE: 114 mg/dL (ref <117)

## 2015-09-07 LAB — COMPREHENSIVE METABOLIC PANEL
ALK PHOS: 62 U/L (ref 33–130)
ALT: 16 U/L (ref 6–29)
AST: 22 U/L (ref 10–35)
Albumin: 3.3 g/dL — ABNORMAL LOW (ref 3.6–5.1)
BUN: 18 mg/dL (ref 7–25)
CALCIUM: 9 mg/dL (ref 8.6–10.4)
CO2: 23 mmol/L (ref 20–31)
Chloride: 104 mmol/L (ref 98–110)
Creat: 1.16 mg/dL — ABNORMAL HIGH (ref 0.60–0.93)
GLUCOSE: 132 mg/dL — AB (ref 65–99)
Potassium: 4.3 mmol/L (ref 3.5–5.3)
Sodium: 137 mmol/L (ref 135–146)
Total Bilirubin: 0.7 mg/dL (ref 0.2–1.2)
Total Protein: 6.6 g/dL (ref 6.1–8.1)

## 2015-09-07 LAB — URIC ACID: URIC ACID, SERUM: 4.6 mg/dL (ref 2.4–7.0)

## 2015-09-07 LAB — TSH: TSH: 2.16 mIU/L

## 2015-09-07 NOTE — Addendum Note (Signed)
Addended by: Tonita Phoenix on: 09/07/2015 10:53 AM   Modules accepted: Orders

## 2015-09-12 ENCOUNTER — Other Ambulatory Visit: Payer: Self-pay | Admitting: Cardiology

## 2015-09-18 ENCOUNTER — Other Ambulatory Visit: Payer: Self-pay | Admitting: Cardiology

## 2015-09-18 NOTE — Telephone Encounter (Signed)
Chart reviewed and Toprol was stopped at time of cardioversion.  I spoke with pt and she has not been taking Toprol.  She complains of a headache for 2-3 weeks and wants medicine for it today.  She reports blood pressure today was 185/85.  No other readings available.  I told pt she could try tylenol for headache. She reports she has tried this and it hasn't helped.  I instructed her to go to Urgent Care but she reports she has no transportation today.  I asked her to contact primary care to schedule appt tomorrow. Will forward to Dr. Mayford Knifeurner for review.

## 2015-09-18 NOTE — Telephone Encounter (Signed)
Requested medication was stopped back in January. Please advise. Thanks, MI

## 2015-09-18 NOTE — Telephone Encounter (Signed)
°*  STAT* If patient is at the pharmacy, call can be transferred to refill team.   1. Which medications need to be refilled? (please list name of each medication and dose if known) Metoprolol-pt needs this today,her blood pressure was 185/85  2. Which pharmacy/location (including street and city if local pharmacy) is medication to be sent to?[facility]-[phone]  3. Do they need a 30 day or 90 day supply? 90 and refills

## 2015-09-18 NOTE — Telephone Encounter (Signed)
Start Amlodipine 5mg  daily and check BP daily for a week and call with results.  If HA does not resolve needs to see PCP

## 2015-09-20 ENCOUNTER — Telehealth: Payer: Self-pay

## 2015-09-20 MED ORDER — AMLODIPINE BESYLATE 5 MG PO TABS: 5.0000 mg | ORAL_TABLET | Freq: Every day | ORAL | Status: DC

## 2015-09-20 NOTE — Telephone Encounter (Signed)
Instructed patient to START AMLODIPINE 5 mg daily, check BP daily for a week and call with results. Patient understands to call PCP if HA does not resolve.

## 2015-09-20 NOTE — Telephone Encounter (Signed)
Per Dr. Mayford Knifeurner, patient to Start Amlodipine 5mg  daily and check BP daily for a week and call with results. If HA does not resolve needs to see PCP   Instructed patient to START AMLODIPINE 5 mg daily, check BP daily for a week and call with results. Patient understands to call PCP if HA does not resolve.

## 2015-09-20 NOTE — Telephone Encounter (Signed)
Left message to call back  

## 2015-09-21 ENCOUNTER — Telehealth: Payer: Self-pay | Admitting: Cardiology

## 2015-09-21 NOTE — Telephone Encounter (Signed)
New message ° ° ° ° °

## 2015-10-26 ENCOUNTER — Encounter: Payer: Self-pay | Admitting: Physical Medicine & Rehabilitation

## 2015-10-26 ENCOUNTER — Encounter: Payer: Medicare Other | Attending: Physical Medicine & Rehabilitation | Admitting: Physical Medicine & Rehabilitation

## 2015-10-26 VITALS — BP 130/40 | HR 74

## 2015-10-26 DIAGNOSIS — E1140 Type 2 diabetes mellitus with diabetic neuropathy, unspecified: Secondary | ICD-10-CM | POA: Insufficient documentation

## 2015-10-26 DIAGNOSIS — G894 Chronic pain syndrome: Secondary | ICD-10-CM | POA: Diagnosis present

## 2015-10-26 DIAGNOSIS — M79604 Pain in right leg: Secondary | ICD-10-CM | POA: Insufficient documentation

## 2015-10-26 DIAGNOSIS — R202 Paresthesia of skin: Secondary | ICD-10-CM | POA: Insufficient documentation

## 2015-10-26 DIAGNOSIS — Z96653 Presence of artificial knee joint, bilateral: Secondary | ICD-10-CM | POA: Diagnosis not present

## 2015-10-26 DIAGNOSIS — I10 Essential (primary) hypertension: Secondary | ICD-10-CM | POA: Insufficient documentation

## 2015-10-26 DIAGNOSIS — I5032 Chronic diastolic (congestive) heart failure: Secondary | ICD-10-CM | POA: Insufficient documentation

## 2015-10-26 DIAGNOSIS — I4510 Unspecified right bundle-branch block: Secondary | ICD-10-CM | POA: Insufficient documentation

## 2015-10-26 DIAGNOSIS — Z79899 Other long term (current) drug therapy: Secondary | ICD-10-CM

## 2015-10-26 DIAGNOSIS — F329 Major depressive disorder, single episode, unspecified: Secondary | ICD-10-CM | POA: Diagnosis not present

## 2015-10-26 DIAGNOSIS — I2510 Atherosclerotic heart disease of native coronary artery without angina pectoris: Secondary | ICD-10-CM | POA: Insufficient documentation

## 2015-10-26 DIAGNOSIS — R200 Anesthesia of skin: Secondary | ICD-10-CM | POA: Diagnosis not present

## 2015-10-26 DIAGNOSIS — R531 Weakness: Secondary | ICD-10-CM | POA: Diagnosis not present

## 2015-10-26 DIAGNOSIS — Z5181 Encounter for therapeutic drug level monitoring: Secondary | ICD-10-CM | POA: Diagnosis not present

## 2015-10-26 DIAGNOSIS — I4891 Unspecified atrial fibrillation: Secondary | ICD-10-CM | POA: Diagnosis not present

## 2015-10-26 DIAGNOSIS — M12811 Other specific arthropathies, not elsewhere classified, right shoulder: Secondary | ICD-10-CM

## 2015-10-26 DIAGNOSIS — Z951 Presence of aortocoronary bypass graft: Secondary | ICD-10-CM | POA: Diagnosis not present

## 2015-10-26 DIAGNOSIS — G4733 Obstructive sleep apnea (adult) (pediatric): Secondary | ICD-10-CM | POA: Insufficient documentation

## 2015-10-26 DIAGNOSIS — Z9889 Other specified postprocedural states: Secondary | ICD-10-CM | POA: Diagnosis not present

## 2015-10-26 DIAGNOSIS — M79605 Pain in left leg: Secondary | ICD-10-CM | POA: Diagnosis not present

## 2015-10-26 DIAGNOSIS — Z87891 Personal history of nicotine dependence: Secondary | ICD-10-CM | POA: Insufficient documentation

## 2015-10-26 DIAGNOSIS — E785 Hyperlipidemia, unspecified: Secondary | ICD-10-CM | POA: Diagnosis not present

## 2015-10-26 DIAGNOSIS — M12511 Traumatic arthropathy, right shoulder: Secondary | ICD-10-CM

## 2015-10-26 DIAGNOSIS — M25511 Pain in right shoulder: Secondary | ICD-10-CM | POA: Insufficient documentation

## 2015-10-26 DIAGNOSIS — W19XXXA Unspecified fall, initial encounter: Secondary | ICD-10-CM

## 2015-10-26 DIAGNOSIS — R42 Dizziness and giddiness: Secondary | ICD-10-CM | POA: Insufficient documentation

## 2015-10-26 DIAGNOSIS — M12819 Other specific arthropathies, not elsewhere classified, unspecified shoulder: Secondary | ICD-10-CM | POA: Insufficient documentation

## 2015-10-26 DIAGNOSIS — R269 Unspecified abnormalities of gait and mobility: Secondary | ICD-10-CM | POA: Insufficient documentation

## 2015-10-26 DIAGNOSIS — E1142 Type 2 diabetes mellitus with diabetic polyneuropathy: Secondary | ICD-10-CM | POA: Diagnosis not present

## 2015-10-26 MED ORDER — GABAPENTIN 600 MG PO TABS: 600.0000 mg | ORAL_TABLET | Freq: Three times a day (TID) | ORAL | Status: DC

## 2015-10-26 MED ORDER — NONFORMULARY OR COMPOUNDED ITEM: Status: DC

## 2015-10-26 NOTE — Progress Notes (Signed)
Subjective:    Patient ID: Lisa SchneidersGrace A Horwitz, female    DOB: Nov 17, 1940, 75 y.o.   MRN: [id number]  HPI  75 y/o female with pmh of afib, DM, CAD, obesity, diastolic heart failure, OSA, OA and psh of right ankle surgery, B/l TKA, Right rotator cuff surgery, and triple bypass presents for pain management.  The pain is mainly is "all over", but mostly in her legs. The pain has been present for "so long it's hard to say".  She denies inciting events.  She uses a cream (cannot recall the name) that helps with the pain.  She cannot identifying exacerbating factors.  The pain in her feet feels like pins.  She denies radiation.  It is constant.  Today, pt states she feels "okay" and her pain is 8/10 in severity.  She denies associated weakness and numbness.  The pain limits her from walking, she states.  She has had steroid injections, but they only last a couple of days.  She notes she fell last week causing a bruising to b/l LE.    Pain Inventory Average Pain 9 Pain Right Now 8 My pain is aching  In the last 24 hours, has pain interfered with the following? General activity 7 Relation with others 2 Enjoyment of life 8 What TIME of day is your pain at its worst? all Sleep (in general) Poor  Pain is worse with: walking, bending, sitting and standing Pain improves with: medication Relief from Meds: 9  Mobility walk with assistance use a cane use a walker how many minutes can you walk? 5 ability to climb steps?  yes do you drive?  no  Function disabled: date disabled . I need assistance with the following:  dressing, bathing, toileting, meal prep, household duties and shopping  Neuro/Psych bladder control problems bowel control problems weakness numbness tremor tingling trouble walking dizziness depression loss of taste or smell  Prior Studies Any changes since last visit?  no  Physicians involved in your care Primary care [facility]   Family  History  Problem Relation Age of Onset  . Heart attack Mother   . Heart disease Mother   . Stomach cancer Father    Social History   Social History  . Marital Status: Widowed    Spouse Name: N/A  . Number of Children: N/A  . Years of Education: N/A   Social History Main Topics  . Smoking status: Former Games developermoker  . Smokeless tobacco: None  . Alcohol Use: No  . Drug Use: No  . Sexual Activity: Not Asked   Other Topics Concern  . None   Social History Narrative   Past Surgical History  Procedure Laterality Date  . Coronary angioplasty with stent placement    . Cardiac surgery    . Knee surgery Bilateral   . Back surgery    . Rotator cuff surgery    . Coronary artery bypass graft    . Cardiac catheterization    . Orif ankle fracture Right 08/19/2014    Procedure: OPEN REDUCTION INTERNAL FIXATION (ORIF) ANKLE FRACTURE;  Surgeon: Cammy CopaGregory Scott Dean, MD;  [facility];  Service: Orthopedics;  Laterality: Right;  . Cardioversion N/A 08/03/2015    Procedure: CARDIOVERSION;  Surgeon: Vesta MixerPhilip J [name], MD;  [facility];  Service: Cardiovascular;  Laterality: N/A;   Past Medical History  Diagnosis Date  . Diabetes mellitus without complication (HCC)   . Hypertension   . Chronic diastolic CHF (congestive heart failure) (  HCC)   . RBBB   . Dyslipidemia   . Morbid obesity (HCC)   . Diastolic dysfunction   . Coronary artery disease     s/p CABG  . Hyperlipidemia   . OSA (obstructive sleep apnea)     mild OSA with AHI 7.15 now on CPAP at 16cm H2O   BP 130/40 mmHg  Pulse 74  SpO2 97%  Opioid Risk Score:   Fall Risk Score:  `1  Depression screen PHQ 2/9  Depression screen PHQ 2/9 10/26/2015  Decreased Interest 3  Down, Depressed, Hopeless 3  PHQ - 2 Score 6  Altered sleeping 2  Tired, decreased energy 3  Change in appetite 3  Feeling bad or failure about yourself  2  Trouble concentrating 0  Moving slowly or fidgety/restless 0  PHQ-9 Score 16   Current  Outpatient Prescriptions on File Prior to Visit  Medication Sig Dispense Refill  . albuterol-ipratropium (COMBIVENT) 18-103 MCG/ACT inhaler Inhale 2 puffs into the lungs every 6 (six) hours as needed for wheezing or shortness of breath. Reported on 08/02/2015    . allopurinol (ZYLOPRIM) 100 MG tablet Take 100 mg by mouth daily.    Marland Kitchen amLODipine (NORVASC) 5 MG tablet Take 1 tablet (5 mg total) by mouth daily. 30 tablet 11  . apixaban (ELIQUIS) 5 MG TABS tablet Take 1 tablet (5 mg total) by mouth 2 (two) times daily. 180 tablet 3  . buPROPion (WELLBUTRIN XL) 150 MG 24 hr tablet Take 150 mg by mouth daily. Reported on 08/02/2015    . clotrimazole-betamethasone (LOTRISONE) lotion Apply 1 application topically 2 (two) times daily. To rash at breast and groin area as directed    . dexamethasone (DECADRON) 4 MG tablet Take 4 mg by mouth daily. Reported on 08/02/2015    . ergocalciferol (VITAMIN D2) 50000 UNITS capsule Take 50,000 Units by mouth 2 (two) times a week. Patient takes every Tuesday and Saturday    . escitalopram (LEXAPRO) 10 MG tablet Take 10 mg by mouth daily.    Marland Kitchen esomeprazole (NEXIUM) 40 MG capsule Take 40 mg by mouth daily before breakfast.    . etodolac (LODINE) 400 MG tablet Take 400 mg by mouth 2 (two) times daily.    . fluticasone (FLOVENT HFA) 220 MCG/ACT inhaler Inhale 1-2 puffs into the lungs 2 (two) times daily.    . furosemide (LASIX) 20 MG tablet Take 1 tablet (20 mg total) by mouth daily. 90 tablet 3  . gabapentin (NEURONTIN) 300 MG capsule Take 300 mg by mouth 3 (three) times daily.    Marland Kitchen HYDROcodone-acetaminophen (NORCO) 10-325 MG tablet Take 1 tablet by mouth every 6 (six) hours as needed for moderate pain. Reported on 08/02/2015    . imipramine (TOFRANIL) 10 MG tablet Take 50 mg by mouth at bedtime. Reported on 08/02/2015    . mupirocin cream (BACTROBAN) 2 % Apply 1 application topically daily.    Marland Kitchen nystatin-triamcinolone (MYCOLOG II) cream Apply 1 application topically 2 (two)  times daily. To rash at groin and breast area as directed    . potassium chloride SA (KLOR-CON M20) 20 MEQ tablet Take 1 tablet (20 mEq total) by mouth daily. 90 tablet 3  . simvastatin (ZOCOR) 10 MG tablet Take 1 tablet (10 mg total) by mouth at bedtime. 90 tablet 3   No current facility-administered medications on file prior to visit.    Review of Systems  Constitutional: Positive for diaphoresis.  Respiratory: Positive for apnea, cough, shortness of breath and  wheezing.   Cardiovascular: Positive for leg swelling.  Musculoskeletal: Negative for back pain.  Skin: Positive for rash.  All other systems reviewed and are negative.     Objective:   Physical Exam HENT: Normocephalic, Atraumatic Eyes: EOMI, Conj WNL Cardio: Irregularly irregula Pulm: B/l clear to auscultation.  Effort normal Abd: Soft, non-distended, non-tender, BS+ MSK:  Gait antalgic.   TTP diffusely b/l LE and right shoulder, no increased TTP over RLE.    Edema b/l LE.   Limited ROM right shoulder (?pain) Neuro: CN II-XII grossly intact.    Sensation intact to light touch in all UE and LE dermatomes  Reflexes symmetric  Strength  4--4/5 in all UE and LE myotomes Skin: Large hematoma anterior RLE and hematoma anterior LLE.     Assessment & Plan:  75 y/o female with pmh of afib, DM, obesity, diastolic heart failure, OSA, OA and psh of right ankle surgery, B/l TKA, Right rotator cuff surgery, and triple bypass presents for pain management.   1. Chronic pain syndrome - diffuse  Multifactorial  Most prominent in B/l LE, and right shoulder  Instructed pt not to take NSAIDs given CAD  Pt states she may have access to a pool, encouraged pool therapy  Has not had PT in years, will order PT for strengthening and TENS evaluation  Pt on imipramine, will not order tramadol at this time due to risk of seizures  Cont tylenol PRN 2000mg /day   Will order compound cream: Ketoprofen 20%, Ketamine 4%, Amitriptyline 5%   2.  Diabetic Neuropathy  Contributing to pain  Will increase Gabapentin to 600 TID  3. Abnormality of gait, will falls  Use single point cane in the house and rollator for community use  Will order quad cane  Will order PT for strengthening and falls prevention  Will order xray of right leg  4. Rotator cuff arthropathy  Pt not interested in injections  See #1  Cont ROM excercises  5. Morbid obesity  PT ordered  Encouraged Pool therapy  Educated on diet control

## 2015-10-26 NOTE — Addendum Note (Signed)
Addended by: Doreene ElandSHUMAKER, Albertine Lafoy W on: 10/26/2015 02:35 PM   Modules accepted: Orders

## 2015-10-30 ENCOUNTER — Telehealth: Payer: Self-pay

## 2015-10-30 MED ORDER — GABAPENTIN 600 MG PO TABS: 600.0000 mg | ORAL_TABLET | Freq: Three times a day (TID) | ORAL | Status: DC

## 2015-10-30 NOTE — Telephone Encounter (Signed)
Pt requested 90 days to be sent.

## 2015-11-04 LAB — TOXASSURE SELECT,+ANTIDEPR,UR: PDF: 0

## 2015-11-08 NOTE — Progress Notes (Signed)
Urine drug screen for this encounter is consistent for no narcoticmedication

## 2015-11-29 ENCOUNTER — Ambulatory Visit (INDEPENDENT_AMBULATORY_CARE_PROVIDER_SITE_OTHER): Payer: Medicare Other | Admitting: Podiatry

## 2015-11-29 ENCOUNTER — Encounter: Payer: Self-pay | Admitting: Podiatry

## 2015-11-29 VITALS — BP 155/69 | HR 72 | Resp 12

## 2015-11-29 DIAGNOSIS — M2011 Hallux valgus (acquired), right foot: Secondary | ICD-10-CM

## 2015-11-29 DIAGNOSIS — M79674 Pain in right toe(s): Secondary | ICD-10-CM | POA: Diagnosis not present

## 2015-11-29 DIAGNOSIS — E1140 Type 2 diabetes mellitus with diabetic neuropathy, unspecified: Secondary | ICD-10-CM

## 2015-11-29 DIAGNOSIS — B351 Tinea unguium: Secondary | ICD-10-CM | POA: Diagnosis not present

## 2015-11-29 DIAGNOSIS — M79675 Pain in left toe(s): Secondary | ICD-10-CM | POA: Diagnosis not present

## 2015-11-29 NOTE — Progress Notes (Signed)
   Subjective:    Patient ID: Lisa [name], female    DOB: [birth year], 75 y.o.   MRN: [id number]  HPI    Today this patient presents with her aid present in the treatment room complaining of uncomfortable toenails walking wearing shoes and request toenail debridement. Somewhat difficult to elicit complete history of the chief complaint however the symptoms seem to be gradually progressive over time and patient does not recall any recent podiatric care. She is also  is requesting diabetic shoes  Patient is diabetic and she denies any history of foot ulceration, claudication or amputation   Review of Systems  Skin: Positive for color change.       Objective:   Physical Exam  Vascular: No calf pain or calf tenderness bilaterally Low-grade nonpitting edema right ankle DP pulses 2/4 bilaterally PT pulses 1/4 bilaterally Capillary reflex immediate bilaterally  Neurological: To 10 g monofilament wire intact 3/5 right and 4/5 left Vibratory sensation nonreactive bilaterally Ankle reflex equal reactive bilaterally  Dermatological: No open skin lesions bilaterally Surgical scars medial lateral right ankle and dorsal first left MPJ The toenails are elongated, brittle, deformed, discolored tender direct palpation 6-10  Musculoskeletal: Pes planus bilaterally HAV right Hammertoe second right No restriction in ankle, subtalar, midtarsal joints General tenderness around the right lateral medial ankle        Assessment & Plan:   Assessment: Decreased posterior tibial pulses bilaterally Reduce protective sensation bilaterally Diabetic peripheral neuropathy 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 I offered him toenail debridement they verbally consents. The toenails 6-10 were debrided mechanically and electrically without any bleeding  Obtain certification for  diabetic shoes for the indications of Diabetic neuropathy HAV right Hammertoe second right Loss of protective sensation bilaterally Loss of vibratory sensation bilaterally  Reappoint at three-month intervals for nail debridement and notify patient upon receipt of certification for diabetic shoes

## 2015-11-29 NOTE — Patient Instructions (Signed)
Diabetes and Foot Care Diabetes may cause you to have problems because of poor blood supply (circulation) to your feet and legs. This may cause the skin on your feet to become thinner, break easier, and heal more slowly. Your skin may become dry, and the skin may peel and crack. You may also have nerve damage in your legs and feet causing decreased feeling in them. You may not notice minor injuries to your feet that could lead to infections or more serious problems. Taking care of your feet is one of the most important things you can do for yourself.  HOME CARE INSTRUCTIONS  Wear shoes at all times, even in the house. Do not go barefoot. Bare feet are easily injured.  Check your feet daily for blisters, cuts, and redness. If you cannot see the bottom of your feet, use a mirror or ask someone for help.  Wash your feet with warm water (do not use hot water) and mild soap. Then pat your feet and the areas between your toes until they are completely dry. Do not soak your feet as this can dry your skin.  Apply a moisturizing lotion or petroleum jelly (that does not contain alcohol and is unscented) to the skin on your feet and to dry, brittle toenails. Do not apply lotion between your toes.  Trim your toenails straight across. Do not dig under them or around the cuticle. File the edges of your nails with an emery board or nail file.  Do not cut corns or calluses or try to remove them with medicine.  Wear clean socks or stockings every day. Make sure they are not too tight. Do not wear knee-high stockings since they may decrease blood flow to your legs.  Wear shoes that fit properly and have enough cushioning. To break in new shoes, wear them for just a few hours a day. This prevents you from injuring your feet. Always look in your shoes before you put them on to be sure there are no objects inside.  Do not cross your legs. This may decrease the blood flow to your feet.  If you find a minor scrape,  cut, or break in the skin on your feet, keep it and the skin around it clean and dry. These areas may be cleansed with mild soap and water. Do not cleanse the area with peroxide, alcohol, or iodine.  When you remove an adhesive bandage, be sure not to damage the skin around it.  If you have a wound, look at it several times a day to make sure it is healing.  Do not use heating pads or hot water bottles. They may burn your skin. If you have lost feeling in your feet or legs, you may not know it is happening until it is too late.  Make sure your health care provider performs a complete foot exam at least annually or more often if you have foot problems. Report any cuts, sores, or bruises to your health care provider immediately. SEEK MEDICAL CARE IF:   You have an injury that is not healing.  You have cuts or breaks in the skin.  You have an ingrown nail.  You notice redness on your legs or feet.  You feel burning or tingling in your legs or feet.  You have pain or cramps in your legs and feet.  Your legs or feet are numb.  Your feet always feel cold. SEEK IMMEDIATE MEDICAL CARE IF:   There is increasing redness,   swelling, or pain in or around a wound.  There is a red line that goes up your leg.  Pus is coming from a wound.  You develop a fever or as directed by your health care provider.  You notice a bad smell coming from an ulcer or wound.   This information is not intended to replace advice given to you by your health care provider. Make sure you discuss any questions you have with your health care provider.   Document Released: 06/21/2000 Document Revised: 02/24/2013 Document Reviewed: 12/01/2012 Elsevier Interactive Patient Education 2016 [employer]  

## 2015-12-07 ENCOUNTER — Encounter: Payer: Medicaid Other | Admitting: Physical Medicine & Rehabilitation

## 2015-12-20 ENCOUNTER — Encounter: Payer: Medicaid Other | Admitting: Physical Medicine & Rehabilitation

## 2015-12-27 ENCOUNTER — Encounter: Payer: Medicaid Other | Attending: Physical Medicine & Rehabilitation | Admitting: Physical Medicine & Rehabilitation

## 2015-12-27 ENCOUNTER — Encounter: Payer: Self-pay | Admitting: Physical Medicine & Rehabilitation

## 2015-12-27 VITALS — BP 196/104 | HR 82 | Resp 16

## 2015-12-27 DIAGNOSIS — R202 Paresthesia of skin: Secondary | ICD-10-CM | POA: Insufficient documentation

## 2015-12-27 DIAGNOSIS — G894 Chronic pain syndrome: Secondary | ICD-10-CM | POA: Insufficient documentation

## 2015-12-27 DIAGNOSIS — E1142 Type 2 diabetes mellitus with diabetic polyneuropathy: Secondary | ICD-10-CM | POA: Diagnosis not present

## 2015-12-27 DIAGNOSIS — R531 Weakness: Secondary | ICD-10-CM | POA: Diagnosis not present

## 2015-12-27 DIAGNOSIS — I2510 Atherosclerotic heart disease of native coronary artery without angina pectoris: Secondary | ICD-10-CM | POA: Insufficient documentation

## 2015-12-27 DIAGNOSIS — I4891 Unspecified atrial fibrillation: Secondary | ICD-10-CM | POA: Diagnosis not present

## 2015-12-27 DIAGNOSIS — Z96653 Presence of artificial knee joint, bilateral: Secondary | ICD-10-CM | POA: Diagnosis not present

## 2015-12-27 DIAGNOSIS — I4510 Unspecified right bundle-branch block: Secondary | ICD-10-CM | POA: Diagnosis not present

## 2015-12-27 DIAGNOSIS — R42 Dizziness and giddiness: Secondary | ICD-10-CM | POA: Diagnosis not present

## 2015-12-27 DIAGNOSIS — E785 Hyperlipidemia, unspecified: Secondary | ICD-10-CM | POA: Diagnosis not present

## 2015-12-27 DIAGNOSIS — I10 Essential (primary) hypertension: Secondary | ICD-10-CM | POA: Insufficient documentation

## 2015-12-27 DIAGNOSIS — Z9889 Other specified postprocedural states: Secondary | ICD-10-CM | POA: Insufficient documentation

## 2015-12-27 DIAGNOSIS — F329 Major depressive disorder, single episode, unspecified: Secondary | ICD-10-CM | POA: Insufficient documentation

## 2015-12-27 DIAGNOSIS — G4733 Obstructive sleep apnea (adult) (pediatric): Secondary | ICD-10-CM | POA: Insufficient documentation

## 2015-12-27 DIAGNOSIS — M12819 Other specific arthropathies, not elsewhere classified, unspecified shoulder: Secondary | ICD-10-CM | POA: Diagnosis not present

## 2015-12-27 DIAGNOSIS — M25511 Pain in right shoulder: Secondary | ICD-10-CM | POA: Diagnosis not present

## 2015-12-27 DIAGNOSIS — I5032 Chronic diastolic (congestive) heart failure: Secondary | ICD-10-CM | POA: Diagnosis not present

## 2015-12-27 DIAGNOSIS — R269 Unspecified abnormalities of gait and mobility: Secondary | ICD-10-CM | POA: Diagnosis not present

## 2015-12-27 DIAGNOSIS — E1140 Type 2 diabetes mellitus with diabetic neuropathy, unspecified: Secondary | ICD-10-CM | POA: Diagnosis not present

## 2015-12-27 DIAGNOSIS — Z951 Presence of aortocoronary bypass graft: Secondary | ICD-10-CM | POA: Insufficient documentation

## 2015-12-27 DIAGNOSIS — R200 Anesthesia of skin: Secondary | ICD-10-CM | POA: Insufficient documentation

## 2015-12-27 DIAGNOSIS — Z87891 Personal history of nicotine dependence: Secondary | ICD-10-CM | POA: Insufficient documentation

## 2015-12-27 DIAGNOSIS — M79605 Pain in left leg: Secondary | ICD-10-CM | POA: Insufficient documentation

## 2015-12-27 DIAGNOSIS — M79604 Pain in right leg: Secondary | ICD-10-CM | POA: Insufficient documentation

## 2015-12-27 MED ORDER — TAPENTADOL HCL 50 MG PO TABS: 50.0000 mg | ORAL_TABLET | Freq: Two times a day (BID) | ORAL | Status: DC | PRN

## 2015-12-27 MED ORDER — DICLOFENAC SODIUM 1 % TD GEL: 2.0000 g | Freq: Four times a day (QID) | TRANSDERMAL | Status: DC

## 2015-12-27 NOTE — Progress Notes (Addendum)
Subjective:    Patient ID: Lisa [name], female    DOB: [birth year], 75 y.o.   MRN: [id number]  HPI  75 y/o female with pmh of afib, DM, CAD, obesity, diastolic heart failure, OSA, OA and psh of right ankle surgery, B/l TKA, Right rotator cuff surgery, and triple bypass presents for follow up for pain management.  The pain is mainly is "all over", but mostly in her legs. The pain has been present for "so long it's hard to say".  She denies inciting events.  She cannot identifying exacerbating factors.  The pain in her feet feels like "hurt".  She denies radiation.  It is constant.  She denies associated weakness and numbness.  The pain limits her from walking, she states.  She has had steroid injections, but they only last a couple of days.  She notes she fell last week causing a bruising to b/l LE.    Last clinic visit 4/20. Today, pt states 9/10 in severity.  Pt now denies having access to a pool.  She was ordered PT on last visit, but states she did not go because she "didn't feel like it".  The compound medication "helped some".  The increase in Gabapentin helped some.  She never went to get her quad cane.  She never got an xray of her leg.  Since last visit, pt states the pain is about the same.    Pain Inventory Average Pain 9 Pain Right Now 9 My pain is aching  In the last 24 hours, has pain interfered with the following? General activity 10 Relation with others 8 Enjoyment of life 10 What TIME of day is your pain at its worst? morning Sleep (in general) Poor  Pain is worse with: walking, standing and some activites Pain improves with: rest and medication Relief from Meds: 8  Mobility walk with assistance use a cane use a walker how many minutes can you walk? 10 ability to climb steps?  yes do you drive?  no  Function disabled: date disabled . I need assistance with the following:  dressing, bathing, toileting, meal prep, household duties and  shopping  Neuro/Psych bladder control problems bowel control problems weakness numbness tremor tingling trouble walking dizziness depression loss of taste or smell  Prior Studies Any changes since last visit?  no  Physicians involved in your care Primary care [facility]ell [facility]   Family History  Problem Relation Age of Onset  . Heart attack Mother   . Heart disease Mother   . Stomach cancer Father    Social History   Social History  . Marital Status: Widowed    Spouse Name: N/A  . Number of Children: N/A  . Years of Education: N/A   Social History Main Topics  . Smoking status: Former Games developer  . Smokeless tobacco: Not on file  . Alcohol Use: No  . Drug Use: No  . Sexual Activity: Not on file   Other Topics Concern  . Not on file   Social History Narrative   Past Surgical History  Procedure Laterality Date  . Coronary angioplasty with stent placement    . Cardiac surgery    . Knee surgery Bilateral   . Back surgery    . Rotator cuff surgery    . Coronary artery bypass graft    . Cardiac catheterization    . Orif ankle fracture Right 08/19/2014    Procedure: OPEN REDUCTION INTERNAL FIXATION (ORIF) ANKLE FRACTURE;  Surgeon: Earl Lites  Dorene GrebeScott Dean, MD;  [facility];  Service: Orthopedics;  Laterality: Right;  . Cardioversion N/A 08/03/2015    Procedure: CARDIOVERSION;  Surgeon: Vesta MixerPhilip J [name], MD;  [facility];  Service: Cardiovascular;  Laterality: N/A;   Past Medical History  Diagnosis Date  . Diabetes mellitus without complication (HCC)   . Hypertension   . Chronic diastolic CHF (congestive heart failure) (HCC)   . RBBB   . Dyslipidemia   . Morbid obesity (HCC)   . Diastolic dysfunction   . Coronary artery disease     s/p CABG  . Hyperlipidemia   . OSA (obstructive sleep apnea)     mild OSA with AHI 7.15 now on CPAP at 16cm H2O   There were no vitals taken for this visit.  Opioid Risk Score:   Fall Risk Score:   `1  Depression screen PHQ 2/9  Depression screen PHQ 2/9 10/26/2015  Decreased Interest 3  Down, Depressed, Hopeless 3  PHQ - 2 Score 6  Altered sleeping 2  Tired, decreased energy 3  Change in appetite 3  Feeling bad or failure about yourself  2  Trouble concentrating 0  Moving slowly or fidgety/restless 0  PHQ-9 Score 16   Current Outpatient Prescriptions on File Prior to Visit  Medication Sig Dispense Refill  . albuterol-ipratropium (COMBIVENT) 18-103 MCG/ACT inhaler Inhale 2 puffs into the lungs every 6 (six) hours as needed for wheezing or shortness of breath. Reported on 08/02/2015    . allopurinol (ZYLOPRIM) 100 MG tablet Take 100 mg by mouth daily.    Marland Kitchen. amLODipine (NORVASC) 5 MG tablet Take 1 tablet (5 mg total) by mouth daily. 30 tablet 11  . apixaban (ELIQUIS) 5 MG TABS tablet Take 1 tablet (5 mg total) by mouth 2 (two) times daily. 180 tablet 3  . buPROPion (WELLBUTRIN XL) 150 MG 24 hr tablet Take 150 mg by mouth daily. Reported on 08/02/2015    . clotrimazole-betamethasone (LOTRISONE) lotion Apply 1 application topically 2 (two) times daily. To rash at breast and groin area as directed    . dexamethasone (DECADRON) 4 MG tablet Take 4 mg by mouth daily. Reported on 08/02/2015    . ergocalciferol (VITAMIN D2) 50000 UNITS capsule Take 50,000 Units by mouth 2 (two) times a week. Patient takes every Tuesday and Saturday    . escitalopram (LEXAPRO) 10 MG tablet Take 10 mg by mouth daily.    Marland Kitchen. esomeprazole (NEXIUM) 40 MG capsule Take 40 mg by mouth daily before breakfast.    . etodolac (LODINE) 400 MG tablet Take 400 mg by mouth 2 (two) times daily.    . fluticasone (FLOVENT HFA) 220 MCG/ACT inhaler Inhale 1-2 puffs into the lungs 2 (two) times daily.    . furosemide (LASIX) 20 MG tablet Take 1 tablet (20 mg total) by mouth daily. 90 tablet 3  . gabapentin (NEURONTIN) 600 MG tablet Take 1 tablet (600 mg total) by mouth 3 (three) times daily. 270 tablet 0  . HYDROcodone-acetaminophen  (NORCO) 10-325 MG tablet Take 1 tablet by mouth every 6 (six) hours as needed for moderate pain. Reported on 08/02/2015    . imipramine (TOFRANIL) 10 MG tablet Take 50 mg by mouth at bedtime. Reported on 08/02/2015    . mupirocin cream (BACTROBAN) 2 % Apply 1 application topically daily.    . NONFORMULARY OR COMPOUNDED ITEM Ketoprofen 20%, Ketamine 4%, Amitriptyline 5% compounded cream 60 each 1  . nystatin-triamcinolone (MYCOLOG II) cream Apply 1 application topically 2 (two)  times daily. To rash at groin and breast area as directed    . potassium chloride SA (KLOR-CON M20) 20 MEQ tablet Take 1 tablet (20 mEq total) by mouth daily. 90 tablet 3  . simvastatin (ZOCOR) 10 MG tablet Take 1 tablet (10 mg total) by mouth at bedtime. 90 tablet 3   No current facility-administered medications on file prior to visit.    Review of Systems  Constitutional: Positive for diaphoresis, appetite change and unexpected weight change.  Respiratory: Positive for apnea, cough, shortness of breath and wheezing.   Cardiovascular: Positive for leg swelling.  Gastrointestinal: Positive for diarrhea.  Musculoskeletal: Negative for back pain.  Skin: Positive for rash.  All other systems reviewed and are negative.     Objective:   Physical Exam HENT: Normocephalic, Atraumatic Eyes: EOMI, Conj WNL Cardio: Irregularly irregular Pulm: B/l clear to auscultation.  Effort normal Abd: Soft, non-distended, non-tender, BS+ MSK:  Gait antalgic.   TTP diffusely b/l LE and right shoulder, no increased TTP over RLE.    Edema b/l LE.   Limited ROM right shoulder Neuro: CN II-XII grossly intact.    Sensation intact to light touch in all UE and LE dermatomes  Reflexes symmetric  Strength  4--4/5 in all UE and LE myotomes Skin: Hematomas anterior RLE and hematoma anterior LLE improving.     Assessment & Plan:  75 y/o female with pmh of afib, DM, obesity, diastolic heart failure, OSA, OA and psh of right ankle surgery, B/l  TKA, Right rotator cuff surgery, and triple bypass presents for pain management.   1. Chronic pain syndrome - diffuse  Multifactorial  Most prominent in B/l LE, and right shoulder  Instructed pt not to take NSAIDs given CAD  Pt on imipramine, will not order tramadol at this time due to risk of seizures  Will not encourage pool therapy due to CHF  Has not had PT in years, ordered PT for strengthening and TENS evaluation on last visit, pt is still not sure she would like to go  Cont tylenol PRN 2000mg /day   Cont compounded cream: Ketoprofen 20%, Ketamine 4%, Amitriptyline 5% with some benefit  Voltaren gel ordered for right shoulder  Nucynta 50 BID PRN ordered.  Instructed pt this would not be a long term solution, but will prescribe and monitor efficacy to allow for increased mobility.    2. Diabetic Neuropathy  Contributing to pain  Increased Gabapentin to 900 TID  3. Abnormality of gait, with falls  Use single point cane in the house and rollator for community use  Instructed pt to obtain quad cane  Has not had PT in years, ordered PT for strengthening, falls prevention on last visit, pt is still not sure she would like to go  Pt did not obtain xray, but appears to be improving  4. Rotator cuff arthropathy s/p surgery  Pt not interested in injections  See #1  Cont ROM excercises  5. Morbid obesity  PT ordered, see above  Educated on diet control  Time spent with patient: 40 minutes, time spent counseling 30 min.

## 2016-01-01 ENCOUNTER — Telehealth: Payer: Self-pay

## 2016-01-01 NOTE — Telephone Encounter (Signed)
Prior auth for Eliquis 5 mg submitted to [facility]. 

## 2016-01-02 ENCOUNTER — Telehealth: Payer: Self-pay

## 2016-01-02 NOTE — Telephone Encounter (Signed)
Eliquis 5mg  approved through 07/07/2016. [id number].

## 2016-01-17 DIAGNOSIS — H52203 Unspecified astigmatism, bilateral: Secondary | ICD-10-CM | POA: Diagnosis not present

## 2016-01-17 DIAGNOSIS — H353132 Nonexudative age-related macular degeneration, bilateral, intermediate dry stage: Secondary | ICD-10-CM | POA: Diagnosis not present

## 2016-01-17 DIAGNOSIS — E119 Type 2 diabetes mellitus without complications: Secondary | ICD-10-CM | POA: Diagnosis not present

## 2016-01-17 DIAGNOSIS — Z961 Presence of intraocular lens: Secondary | ICD-10-CM | POA: Diagnosis not present

## 2016-01-24 ENCOUNTER — Ambulatory Visit: Payer: Self-pay | Admitting: Cardiology

## 2016-01-24 DIAGNOSIS — I10 Essential (primary) hypertension: Secondary | ICD-10-CM | POA: Diagnosis not present

## 2016-01-24 DIAGNOSIS — I509 Heart failure, unspecified: Secondary | ICD-10-CM | POA: Diagnosis not present

## 2016-01-24 DIAGNOSIS — M150 Primary generalized (osteo)arthritis: Secondary | ICD-10-CM | POA: Diagnosis not present

## 2016-01-24 DIAGNOSIS — E1165 Type 2 diabetes mellitus with hyperglycemia: Secondary | ICD-10-CM | POA: Diagnosis not present

## 2016-01-25 ENCOUNTER — Encounter: Payer: Self-pay | Admitting: Cardiology

## 2016-01-25 ENCOUNTER — Telehealth: Payer: Self-pay | Admitting: Cardiology

## 2016-01-25 DIAGNOSIS — H26492 Other secondary cataract, left eye: Secondary | ICD-10-CM | POA: Diagnosis not present

## 2016-01-25 NOTE — Telephone Encounter (Signed)
New message     FYI Pt had a minor eye procedure this am in the surgery ctr.  Her HR before procedure was in the 60's and after procedure it is in the 30's.  Pt is still in their office.  Dr Burgess Estelleanner did not want to talk to DOD but wanted pt to see Dr Mayford Knifeurner sooner than aug appt.  Appt was made for pt to see Dr Mayford Knifeurner tomorrow at 10:45.

## 2016-01-25 NOTE — Telephone Encounter (Signed)
Per Dr. Mayford Knifeurner, called patient to have her come in for an EKG today. She st she cannot get a ride today.  Rescheduled patient for EKG nurse visit tomorrow at 1100. Patient was grateful for call.

## 2016-01-25 NOTE — Telephone Encounter (Signed)
To Dr. Turner.  

## 2016-01-26 ENCOUNTER — Ambulatory Visit: Payer: Self-pay | Admitting: Cardiology

## 2016-01-26 ENCOUNTER — Ambulatory Visit (INDEPENDENT_AMBULATORY_CARE_PROVIDER_SITE_OTHER): Payer: Medicare Other

## 2016-01-26 VITALS — HR 76

## 2016-01-26 DIAGNOSIS — R001 Bradycardia, unspecified: Secondary | ICD-10-CM | POA: Diagnosis not present

## 2016-01-26 NOTE — Progress Notes (Signed)
The pt arrives in the office for an EKG per Dr Mayford Knifeurner due to a decrease in the pts HR after minor eye surgery on 01/25/16. See phone note from 01/25/16.  The pts HR at this nurse visit is 76 bpm and the pt denies dizziness, any syncopal events, CP, SOB  EKG obtained and given to Dr Mayford Knifeurner for review.  Per Dr Mayford Knifeurner the pt needs to wear a 24 hour holter monitor. The pt is advised and she is in agreement with plan.  The pt is scheduled to come to this office on Monday 7/24 at 11:30 for holter monitor placement. She is aware and agrees with date and time of appt.

## 2016-01-29 ENCOUNTER — Ambulatory Visit (INDEPENDENT_AMBULATORY_CARE_PROVIDER_SITE_OTHER): Payer: Medicare Other

## 2016-01-29 DIAGNOSIS — R001 Bradycardia, unspecified: Secondary | ICD-10-CM

## 2016-02-06 ENCOUNTER — Ambulatory Visit: Payer: Medicare Other

## 2016-02-07 ENCOUNTER — Ambulatory Visit: Payer: Medicare Other

## 2016-02-08 ENCOUNTER — Ambulatory Visit: Payer: Self-pay | Admitting: Physical Medicine & Rehabilitation

## 2016-02-12 ENCOUNTER — Telehealth: Payer: Self-pay

## 2016-02-12 DIAGNOSIS — R001 Bradycardia, unspecified: Secondary | ICD-10-CM

## 2016-02-12 MED ORDER — METOPROLOL SUCCINATE ER 25 MG PO TB24: 25.0000 mg | ORAL_TABLET | Freq: Every day | ORAL | 11 refills | Status: DC

## 2016-02-12 NOTE — Telephone Encounter (Signed)
Instructed patient to START TOPROL 25 mg daily. Holter ordered for scheduling in 1 week. Patient agrees with treatment plan.

## 2016-02-12 NOTE — Telephone Encounter (Signed)
-----   Message from Quintella Reichertraci R Turner, MD sent at 02/08/2016  1:09 PM EDT ----- Please make changes in med list to reflect that she is not on metoprolol  [name] ----- Message ----- From: Henrietta DineKathryn A Raymona Boss, RN Sent: 02/08/2016   1:00 PM To: Quintella Reichertraci R Turner, MD  Informed patient of results and verbal understanding expressed.   Patient is not currently taking Metoprolol. It was discontinued at discharge 08/03/15.

## 2016-02-15 ENCOUNTER — Encounter: Payer: Medicaid Other | Admitting: Physical Medicine & Rehabilitation

## 2016-02-21 ENCOUNTER — Encounter: Payer: Self-pay | Admitting: *Deleted

## 2016-02-21 NOTE — Progress Notes (Signed)
Please call patient to reschedule.

## 2016-02-21 NOTE — Progress Notes (Signed)
Patient ID: Lisa SchneidersGrace A Shifrin, female   DOB: 1941/03/23, 75 y.o.   MRN: [id number] Patient did not show up for 02/21/16, 12:30 PM, appointment, to have a 24 hour holter monitor applied.

## 2016-02-21 NOTE — Progress Notes (Signed)
[facility] monitor.

## 2016-02-21 NOTE — Progress Notes (Signed)
Patient has been rescheduled to 8/21.

## 2016-02-26 ENCOUNTER — Ambulatory Visit (INDEPENDENT_AMBULATORY_CARE_PROVIDER_SITE_OTHER): Payer: Medicare Other

## 2016-02-26 DIAGNOSIS — R001 Bradycardia, unspecified: Secondary | ICD-10-CM

## 2016-03-06 ENCOUNTER — Ambulatory Visit (INDEPENDENT_AMBULATORY_CARE_PROVIDER_SITE_OTHER): Payer: Medicare Other | Admitting: Podiatry

## 2016-03-06 ENCOUNTER — Ambulatory Visit: Payer: Medicare Other | Admitting: *Deleted

## 2016-03-06 DIAGNOSIS — M79675 Pain in left toe(s): Secondary | ICD-10-CM

## 2016-03-06 DIAGNOSIS — E1140 Type 2 diabetes mellitus with diabetic neuropathy, unspecified: Secondary | ICD-10-CM

## 2016-03-06 DIAGNOSIS — M79674 Pain in right toe(s): Secondary | ICD-10-CM | POA: Diagnosis not present

## 2016-03-06 DIAGNOSIS — B351 Tinea unguium: Secondary | ICD-10-CM

## 2016-03-06 NOTE — Progress Notes (Signed)
Patient ID: Lisa [name], female   DOB: 05/04/1941, 75 y.o.   MRN: [id number]  Patient presents to be scanned and measured for diabetic shoes and inserts.

## 2016-03-07 ENCOUNTER — Ambulatory Visit: Payer: Self-pay | Admitting: Cardiology

## 2016-03-07 NOTE — Progress Notes (Signed)
Patient ID: Lisa [name], female   DOB: 1940/12/07, 75 y.o.   MRN: [id number]    Today this patient presents with her aid present in the treatment room complaining of uncomfortable toenails walking wearing shoes and request toenail debridement. Somewhat difficult to elicit complete history of the chief complaint however the symptoms seem to be gradually progressive over time and patient does not recall any recent podiatric care. She is also  is requesting diabetic shoes  Patient is diabetic and she denies any history of foot ulceration, claudication or amputation Certification for diabetic shoes confirmed and patient will be scan for diabetic shoes and custom insoles today  Physical Exam  Vascular: No calf pain or calf tenderness bilaterally Low-grade nonpitting edema right ankle DP pulses 2/4 bilaterally PT pulses 1/4 bilaterally Capillary reflex immediate bilaterally  Neurological: To 10 g monofilament wire intact 3/5 right and 4/5 left Vibratory sensation nonreactive bilaterally Ankle reflex equal reactive bilaterally  Dermatological: No open skin lesions bilaterally Surgical scars medial lateral right ankle and dorsal first left MPJ The toenails are elongated, brittle, deformed, discolored tender direct palpation 6-10  Musculoskeletal: 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 I offered him toenail debridement they verbally consents. The toenails 6-10 were debrided mechanically and electrically without any bleeding  certification for diabetic shoes for the indications of Diabetic neuropathy HAV right Hammertoe second right Loss of protective sensation bilaterally Loss of vibratory sensation bilaterally  Digital scan obtained today for diabetic shoes and custom insoles  and notify patient upon receipt of shoes and insoles  Reappoint 3 months for nail debridement

## 2016-03-12 ENCOUNTER — Ambulatory Visit: Payer: Self-pay | Admitting: Cardiology

## 2016-05-03 ENCOUNTER — Encounter: Payer: Self-pay | Admitting: Cardiology

## 2016-05-03 ENCOUNTER — Observation Stay (HOSPITAL_COMMUNITY)
Admission: AD | Admit: 2016-05-03 | Discharge: 2016-05-09 | Disposition: A | Discharge status: Skilled Nursing Facility | Payer: Medicare Other | Source: Ambulatory Visit | Attending: Internal Medicine | Admitting: Internal Medicine

## 2016-05-03 ENCOUNTER — Ambulatory Visit (INDEPENDENT_AMBULATORY_CARE_PROVIDER_SITE_OTHER): Payer: Medicare Other | Admitting: Cardiology

## 2016-05-03 ENCOUNTER — Encounter (HOSPITAL_COMMUNITY): Payer: Self-pay | Admitting: *Deleted

## 2016-05-03 VITALS — BP 150/76 | HR 77 | Ht 62.0 in | Wt 232.2 lb

## 2016-05-03 DIAGNOSIS — D631 Anemia in chronic kidney disease: Secondary | ICD-10-CM | POA: Insufficient documentation

## 2016-05-03 DIAGNOSIS — I5032 Chronic diastolic (congestive) heart failure: Secondary | ICD-10-CM

## 2016-05-03 DIAGNOSIS — R112 Nausea with vomiting, unspecified: Secondary | ICD-10-CM

## 2016-05-03 DIAGNOSIS — E1122 Type 2 diabetes mellitus with diabetic chronic kidney disease: Secondary | ICD-10-CM | POA: Diagnosis not present

## 2016-05-03 DIAGNOSIS — I4510 Unspecified right bundle-branch block: Secondary | ICD-10-CM | POA: Diagnosis not present

## 2016-05-03 DIAGNOSIS — I481 Persistent atrial fibrillation: Secondary | ICD-10-CM

## 2016-05-03 DIAGNOSIS — G4733 Obstructive sleep apnea (adult) (pediatric): Secondary | ICD-10-CM

## 2016-05-03 DIAGNOSIS — I130 Hypertensive heart and chronic kidney disease with heart failure and stage 1 through stage 4 chronic kidney disease, or unspecified chronic kidney disease: Secondary | ICD-10-CM | POA: Diagnosis not present

## 2016-05-03 DIAGNOSIS — Z951 Presence of aortocoronary bypass graft: Secondary | ICD-10-CM | POA: Insufficient documentation

## 2016-05-03 DIAGNOSIS — I10 Essential (primary) hypertension: Secondary | ICD-10-CM

## 2016-05-03 DIAGNOSIS — K219 Gastro-esophageal reflux disease without esophagitis: Secondary | ICD-10-CM | POA: Diagnosis not present

## 2016-05-03 DIAGNOSIS — I4819 Other persistent atrial fibrillation: Secondary | ICD-10-CM

## 2016-05-03 DIAGNOSIS — I081 Rheumatic disorders of both mitral and tricuspid valves: Secondary | ICD-10-CM | POA: Insufficient documentation

## 2016-05-03 DIAGNOSIS — D696 Thrombocytopenia, unspecified: Secondary | ICD-10-CM | POA: Insufficient documentation

## 2016-05-03 DIAGNOSIS — N281 Cyst of kidney, acquired: Secondary | ICD-10-CM | POA: Diagnosis not present

## 2016-05-03 DIAGNOSIS — Z9889 Other specified postprocedural states: Secondary | ICD-10-CM | POA: Insufficient documentation

## 2016-05-03 DIAGNOSIS — N3289 Other specified disorders of bladder: Secondary | ICD-10-CM | POA: Insufficient documentation

## 2016-05-03 DIAGNOSIS — N19 Unspecified kidney failure: Secondary | ICD-10-CM

## 2016-05-03 DIAGNOSIS — R0789 Other chest pain: Secondary | ICD-10-CM | POA: Diagnosis not present

## 2016-05-03 DIAGNOSIS — N183 Chronic kidney disease, stage 3 (moderate): Secondary | ICD-10-CM | POA: Insufficient documentation

## 2016-05-03 DIAGNOSIS — N179 Acute kidney failure, unspecified: Secondary | ICD-10-CM | POA: Diagnosis not present

## 2016-05-03 DIAGNOSIS — Z7901 Long term (current) use of anticoagulants: Secondary | ICD-10-CM | POA: Diagnosis not present

## 2016-05-03 DIAGNOSIS — R296 Repeated falls: Secondary | ICD-10-CM | POA: Diagnosis not present

## 2016-05-03 DIAGNOSIS — I2510 Atherosclerotic heart disease of native coronary artery without angina pectoris: Secondary | ICD-10-CM | POA: Insufficient documentation

## 2016-05-03 DIAGNOSIS — J9811 Atelectasis: Secondary | ICD-10-CM | POA: Insufficient documentation

## 2016-05-03 DIAGNOSIS — I708 Atherosclerosis of other arteries: Secondary | ICD-10-CM | POA: Insufficient documentation

## 2016-05-03 DIAGNOSIS — E785 Hyperlipidemia, unspecified: Secondary | ICD-10-CM

## 2016-05-03 DIAGNOSIS — Z90710 Acquired absence of both cervix and uterus: Secondary | ICD-10-CM | POA: Insufficient documentation

## 2016-05-03 DIAGNOSIS — R197 Diarrhea, unspecified: Secondary | ICD-10-CM | POA: Diagnosis not present

## 2016-05-03 DIAGNOSIS — R109 Unspecified abdominal pain: Secondary | ICD-10-CM | POA: Diagnosis present

## 2016-05-03 DIAGNOSIS — I2583 Coronary atherosclerosis due to lipid rich plaque: Secondary | ICD-10-CM

## 2016-05-03 DIAGNOSIS — I480 Paroxysmal atrial fibrillation: Secondary | ICD-10-CM | POA: Diagnosis present

## 2016-05-03 DIAGNOSIS — Z6841 Body Mass Index (BMI) 40.0 and over, adult: Secondary | ICD-10-CM | POA: Insufficient documentation

## 2016-05-03 DIAGNOSIS — E0800 Diabetes mellitus due to underlying condition with hyperosmolarity without nonketotic hyperglycemic-hyperosmolar coma (NKHHC): Secondary | ICD-10-CM

## 2016-05-03 DIAGNOSIS — R079 Chest pain, unspecified: Secondary | ICD-10-CM | POA: Diagnosis present

## 2016-05-03 DIAGNOSIS — I700 Atherosclerosis of aorta: Secondary | ICD-10-CM | POA: Insufficient documentation

## 2016-05-03 DIAGNOSIS — R0602 Shortness of breath: Secondary | ICD-10-CM

## 2016-05-03 DIAGNOSIS — Z87891 Personal history of nicotine dependence: Secondary | ICD-10-CM | POA: Insufficient documentation

## 2016-05-03 DIAGNOSIS — J9601 Acute respiratory failure with hypoxia: Secondary | ICD-10-CM | POA: Diagnosis not present

## 2016-05-03 DIAGNOSIS — E7800 Pure hypercholesterolemia, unspecified: Secondary | ICD-10-CM

## 2016-05-03 LAB — MAGNESIUM: Magnesium: 1.4 mg/dL — ABNORMAL LOW (ref 1.7–2.4)

## 2016-05-03 LAB — COMPREHENSIVE METABOLIC PANEL
ALBUMIN: 2.8 g/dL — AB (ref 3.5–5.0)
ALBUMIN: 2.5 g/dL — AB (ref 3.5–5.0)
ALK PHOS: 43 U/L (ref 38–126)
ALT: 8 U/L — AB (ref 14–54)
ALT: 8 U/L — AB (ref 14–54)
AST: 22 U/L (ref 15–41)
AST: 19 U/L (ref 15–41)
Alkaline Phosphatase: 41 U/L (ref 38–126)
Anion gap: 11 (ref 5–15)
Anion gap: 8 (ref 5–15)
BUN: 8 mg/dL (ref 6–20)
BUN: 7 mg/dL (ref 6–20)
CALCIUM: 8.6 mg/dL — AB (ref 8.9–10.3)
CHLORIDE: 103 mmol/L (ref 101–111)
CHLORIDE: 106 mmol/L (ref 101–111)
CO2: 24 mmol/L (ref 22–32)
CO2: 26 mmol/L (ref 22–32)
CREATININE: 1.27 mg/dL — AB (ref 0.44–1.00)
CREATININE: 1.19 mg/dL — AB (ref 0.44–1.00)
Calcium: 8.4 mg/dL — ABNORMAL LOW (ref 8.9–10.3)
GFR calc Af Amer: 50 mL/min — ABNORMAL LOW (ref >60)
GFR calc non Af Amer: 40 mL/min — ABNORMAL LOW (ref >60)
GFR, EST AFRICAN AMERICAN: 46 mL/min — AB (ref >60)
GFR, EST NON AFRICAN AMERICAN: 43 mL/min — AB (ref >60)
GLUCOSE: 76 mg/dL (ref 65–99)
GLUCOSE: 89 mg/dL (ref 65–99)
POTASSIUM: 3 mmol/L — AB (ref 3.5–5.1)
Potassium: 2.9 mmol/L — ABNORMAL LOW (ref 3.5–5.1)
SODIUM: 138 mmol/L (ref 135–145)
Sodium: 140 mmol/L (ref 135–145)
Total Bilirubin: 1.3 mg/dL — ABNORMAL HIGH (ref 0.3–1.2)
Total Bilirubin: 1 mg/dL (ref 0.3–1.2)
Total Protein: 6.2 g/dL — ABNORMAL LOW (ref 6.5–8.1)
Total Protein: 5.6 g/dL — ABNORMAL LOW (ref 6.5–8.1)

## 2016-05-03 LAB — CBC WITH DIFFERENTIAL/PLATELET
BASOS ABS: 0 10*3/uL (ref 0.0–0.1)
Basophils Relative: 1 %
Eosinophils Absolute: 0.1 10*3/uL (ref 0.0–0.7)
Eosinophils Relative: 2 %
HEMATOCRIT: 30.6 % — AB (ref 36.0–46.0)
Hemoglobin: 10.2 g/dL — ABNORMAL LOW (ref 12.0–15.0)
LYMPHS ABS: 1.8 10*3/uL (ref 0.7–4.0)
LYMPHS PCT: 30 %
MCH: 31.8 pg (ref 26.0–34.0)
MCHC: 33.3 g/dL (ref 30.0–36.0)
MCV: 95.3 fL (ref 78.0–100.0)
MONO ABS: 0.4 10*3/uL (ref 0.1–1.0)
Monocytes Relative: 7 %
NEUTROS ABS: 3.8 10*3/uL (ref 1.7–7.7)
Neutrophils Relative %: 60 %
Platelets: 152 10*3/uL (ref 150–400)
RBC: 3.21 MIL/uL — AB (ref 3.87–5.11)
RDW: 13 % (ref 11.5–15.5)
WBC: 6.2 10*3/uL (ref 4.0–10.5)

## 2016-05-03 LAB — PHOSPHORUS: PHOSPHORUS: 3 mg/dL (ref 2.5–4.6)

## 2016-05-03 LAB — GLUCOSE, CAPILLARY
GLUCOSE-CAPILLARY: 79 mg/dL (ref 65–99)
GLUCOSE-CAPILLARY: 90 mg/dL (ref 65–99)

## 2016-05-03 LAB — TROPONIN I
TROPONIN I: 0.04 ng/mL — AB (ref <0.03)
Troponin I: 0.03 ng/mL (ref <0.03)

## 2016-05-03 LAB — LIPASE, BLOOD: LIPASE: 21 U/L (ref 11–51)

## 2016-05-03 LAB — BRAIN NATRIURETIC PEPTIDE: B NATRIURETIC PEPTIDE 5: 257.2 pg/mL — AB (ref 0.0–100.0)

## 2016-05-03 LAB — PROTIME-INR
INR: 1.34
Prothrombin Time: 16.7 seconds — ABNORMAL HIGH (ref 11.4–15.2)

## 2016-05-03 MED ORDER — VITAMIN D (ERGOCALCIFEROL) 1.25 MG (50000 UNIT) PO CAPS
50000.0000 [IU] | ORAL_CAPSULE | ORAL | Status: DC
  Administered 2016-05-06 – 2016-05-09 (×2): 50000 [IU] via ORAL
  Filled 2016-05-03 (×2): qty 1

## 2016-05-03 MED ORDER — POTASSIUM CHLORIDE CRYS ER 20 MEQ PO TBCR
20.0000 meq | EXTENDED_RELEASE_TABLET | Freq: Every day | ORAL | Status: DC
  Administered 2016-05-03: 20 meq via ORAL
  Filled 2016-05-03: qty 1

## 2016-05-03 MED ORDER — GABAPENTIN 600 MG PO TABS
600.0000 mg | ORAL_TABLET | Freq: Three times a day (TID) | ORAL | Status: DC
  Administered 2016-05-03 – 2016-05-09 (×17): 600 mg via ORAL
  Filled 2016-05-03 (×17): qty 1

## 2016-05-03 MED ORDER — PANTOPRAZOLE SODIUM 40 MG PO TBEC
40.0000 mg | DELAYED_RELEASE_TABLET | Freq: Every day | ORAL | Status: DC
  Administered 2016-05-04 – 2016-05-09 (×6): 40 mg via ORAL
  Filled 2016-05-03 (×7): qty 1

## 2016-05-03 MED ORDER — SIMVASTATIN 10 MG PO TABS
10.0000 mg | ORAL_TABLET | Freq: Every day | ORAL | Status: DC
  Administered 2016-05-03: 10 mg via ORAL
  Filled 2016-05-03: qty 1

## 2016-05-03 MED ORDER — ESCITALOPRAM OXALATE 10 MG PO TABS
10.0000 mg | ORAL_TABLET | Freq: Every day | ORAL | Status: DC
  Administered 2016-05-04: 10 mg via ORAL
  Filled 2016-05-03: qty 1

## 2016-05-03 MED ORDER — METOPROLOL SUCCINATE ER 25 MG PO TB24
25.0000 mg | ORAL_TABLET | Freq: Every day | ORAL | Status: DC
  Administered 2016-05-04 – 2016-05-09 (×6): 25 mg via ORAL
  Filled 2016-05-03 (×7): qty 1

## 2016-05-03 MED ORDER — GI COCKTAIL ~~LOC~~: 30.0000 mL | Freq: Four times a day (QID) | ORAL | Status: DC | PRN

## 2016-05-03 MED ORDER — ASPIRIN EC 81 MG PO TBEC
81.0000 mg | DELAYED_RELEASE_TABLET | Freq: Every day | ORAL | Status: DC
  Administered 2016-05-03 – 2016-05-09 (×7): 81 mg via ORAL
  Filled 2016-05-03 (×8): qty 1

## 2016-05-03 MED ORDER — BUDESONIDE 0.25 MG/2ML IN SUSP
0.2500 mg | Freq: Two times a day (BID) | RESPIRATORY_TRACT | Status: DC
  Administered 2016-05-03 – 2016-05-09 (×12): 0.25 mg via RESPIRATORY_TRACT
  Filled 2016-05-03 (×12): qty 2

## 2016-05-03 MED ORDER — IPRATROPIUM-ALBUTEROL 18-103 MCG/ACT IN AERO: 2.0000 | INHALATION_SPRAY | Freq: Four times a day (QID) | RESPIRATORY_TRACT | Status: DC | PRN

## 2016-05-03 MED ORDER — APIXABAN 5 MG PO TABS
5.0000 mg | ORAL_TABLET | Freq: Two times a day (BID) | ORAL | Status: DC
  Administered 2016-05-03 – 2016-05-09 (×12): 5 mg via ORAL
  Filled 2016-05-03 (×12): qty 1

## 2016-05-03 MED ORDER — ACETAMINOPHEN 325 MG PO TABS
650.0000 mg | ORAL_TABLET | Freq: Four times a day (QID) | ORAL | Status: DC | PRN
  Administered 2016-05-05 – 2016-05-08 (×4): 650 mg via ORAL
  Filled 2016-05-03 (×4): qty 2

## 2016-05-03 MED ORDER — ACETAMINOPHEN 325 MG PO TABS: 650.0000 mg | ORAL_TABLET | ORAL | Status: DC | PRN

## 2016-05-03 MED ORDER — INSULIN ASPART 100 UNIT/ML ~~LOC~~ SOLN: 0.0000 [IU] | Freq: Three times a day (TID) | SUBCUTANEOUS | Status: DC

## 2016-05-03 MED ORDER — METOPROLOL SUCCINATE ER 25 MG PO TB24: 25.0000 mg | ORAL_TABLET | Freq: Every day | ORAL | 11 refills | Status: DC

## 2016-05-03 MED ORDER — POTASSIUM CHLORIDE CRYS ER 20 MEQ PO TBCR: 20.0000 meq | EXTENDED_RELEASE_TABLET | Freq: Every day | ORAL | 3 refills | Status: DC

## 2016-05-03 MED ORDER — OXYCODONE HCL 5 MG PO TABS
5.0000 mg | ORAL_TABLET | ORAL | Status: DC | PRN
  Administered 2016-05-03 – 2016-05-05 (×5): 5 mg via ORAL
  Filled 2016-05-03 (×5): qty 1

## 2016-05-03 MED ORDER — BUPROPION HCL ER (XL) 150 MG PO TB24
150.0000 mg | ORAL_TABLET | Freq: Every day | ORAL | Status: DC
  Administered 2016-05-04 – 2016-05-09 (×6): 150 mg via ORAL
  Filled 2016-05-03 (×6): qty 1

## 2016-05-03 MED ORDER — FUROSEMIDE 20 MG PO TABS: 20.0000 mg | ORAL_TABLET | Freq: Every day | ORAL | 3 refills | Status: DC

## 2016-05-03 MED ORDER — ESOMEPRAZOLE MAGNESIUM 40 MG PO CPDR: 40.0000 mg | DELAYED_RELEASE_CAPSULE | Freq: Every day | ORAL | 11 refills | Status: DC

## 2016-05-03 MED ORDER — IMIPRAMINE HCL 25 MG PO TABS
50.0000 mg | ORAL_TABLET | Freq: Every day | ORAL | Status: DC
  Administered 2016-05-03: 50 mg via ORAL
  Filled 2016-05-03: qty 2

## 2016-05-03 MED ORDER — AMLODIPINE BESYLATE 5 MG PO TABS
5.0000 mg | ORAL_TABLET | Freq: Every day | ORAL | Status: DC
  Administered 2016-05-04 – 2016-05-09 (×6): 5 mg via ORAL
  Filled 2016-05-03 (×7): qty 1

## 2016-05-03 MED ORDER — IPRATROPIUM-ALBUTEROL 0.5-2.5 (3) MG/3ML IN SOLN
3.0000 mL | Freq: Four times a day (QID) | RESPIRATORY_TRACT | Status: DC | PRN
  Administered 2016-05-05: 3 mL via RESPIRATORY_TRACT
  Filled 2016-05-03 (×2): qty 3

## 2016-05-03 MED ORDER — POTASSIUM CHLORIDE CRYS ER 20 MEQ PO TBCR
60.0000 meq | EXTENDED_RELEASE_TABLET | ORAL | Status: AC
Start: 2016-05-03 — End: 2016-05-04
  Administered 2016-05-03 – 2016-05-04 (×2): 60 meq via ORAL
  Filled 2016-05-03 (×2): qty 3

## 2016-05-03 MED ORDER — MAGNESIUM SULFATE 2 GM/50ML IV SOLN
2.0000 g | Freq: Once | INTRAVENOUS | Status: AC
  Administered 2016-05-03: 2 g via INTRAVENOUS
  Filled 2016-05-03: qty 50

## 2016-05-03 MED ORDER — SODIUM CHLORIDE 0.9 % IV SOLN
INTRAVENOUS | Status: DC
  Administered 2016-05-03: 19:00:00 via INTRAVENOUS

## 2016-05-03 MED ORDER — ACETAMINOPHEN 650 MG RE SUPP
650.0000 mg | Freq: Four times a day (QID) | RECTAL | Status: DC | PRN
  Filled 2016-05-03: qty 1

## 2016-05-03 MED ORDER — SODIUM CHLORIDE 0.9% FLUSH
3.0000 mL | Freq: Two times a day (BID) | INTRAVENOUS | Status: DC
  Administered 2016-05-04 – 2016-05-09 (×9): 3 mL via INTRAVENOUS

## 2016-05-03 MED ORDER — ONDANSETRON HCL 4 MG/2ML IJ SOLN: 4.0000 mg | Freq: Four times a day (QID) | INTRAMUSCULAR | Status: DC | PRN

## 2016-05-03 NOTE — Progress Notes (Signed)
Pt admitted to 2w34 via EMS. Pt on strecher, on RA. Pt ambulated to bed from strecher with help of EMS worker. Tele monitor placed on pt with gown and pt ambulated to standing position and pt weighed. MD notified of pt on unit. Awaiting orders. Will continue to monitor. Hiram Combererek Lacresha Fusilier RN

## 2016-05-03 NOTE — Progress Notes (Signed)
Cardiology Office Note    Date:  05/03/2016   ID:  Lisa SchneidersGrace A Ketelsen, DOB 02/08/1941, MRN [id number]  PCP:  [name],[name], MD  Cardiologist:  Armanda Magicraci Brynnly Bonet, MD   Chief Complaint  Patient presents with  . Coronary Artery Disease  . Hypertension  . Sleep Apnea  . Atrial Fibrillation    History of Present Illness:  Lisa [name] is a 75 y.o. female with a history of CAD, HTN, dyslipidemia, diastolic dysfunction, chronic diastolic CHF.   She denies any  palpitations, or syncope. She has history of chronic LE edema but that has resolved.  She has been having chest pain intermittently for several weeks.  She cannot describe the pain but it midsternal with radiation into the right shoulder.  This is the same discomfort that she had when she was diagnosed with CAD. She has had worsening of her DOE and can barely walk across the room.  The chest discomfort lasts a few minutes and resolves on its own.  She gets nauseated when she gets the discomfort.  She says that she has been vomiting and had diarrhea off and on with eating and has not eaten in 4 days.   She has not been using her CPAP because she needs a new mask.     Past Medical History:  Diagnosis Date  . Chronic diastolic CHF (congestive heart failure) (HCC)   . Coronary artery disease    s/p CABG  . Diabetes mellitus without complication (HCC)   . Diastolic dysfunction   . Dyslipidemia   . Hyperlipidemia   . Hypertension   . Morbid obesity (HCC)   . OSA (obstructive sleep apnea)    mild OSA with AHI 7.15 now on CPAP at 16cm H2O  . RBBB     Past Surgical History:  Procedure Laterality Date  . BACK SURGERY    . CARDIAC CATHETERIZATION    . CARDIAC SURGERY    . CARDIOVERSION N/A 08/03/2015   Procedure: CARDIOVERSION;  Surgeon: Vesta MixerPhilip J [name], MD;  [facility];  Service: Cardiovascular;  Laterality: N/A;  . CORONARY ANGIOPLASTY WITH STENT PLACEMENT    . CORONARY ARTERY BYPASS GRAFT    . KNEE SURGERY Bilateral    . ORIF ANKLE FRACTURE Right 08/19/2014   Procedure: OPEN REDUCTION INTERNAL FIXATION (ORIF) ANKLE FRACTURE;  Surgeon: Cammy CopaGregory Scott Dean, MD;  [facility];  Service: Orthopedics;  Laterality: Right;  . rotator cuff surgery      Current Medications: Outpatient Medications Prior to Visit  Medication Sig Dispense Refill  . albuterol-ipratropium (COMBIVENT) 18-103 MCG/ACT inhaler Inhale 2 puffs into the lungs every 6 (six) hours as needed for wheezing or shortness of breath. Reported on 08/02/2015    . amLODipine (NORVASC) 5 MG tablet Take 1 tablet (5 mg total) by mouth daily. 30 tablet 11  . apixaban (ELIQUIS) 5 MG TABS tablet Take 1 tablet (5 mg total) by mouth 2 (two) times daily. 180 tablet 3  . buPROPion (WELLBUTRIN XL) 150 MG 24 hr tablet Take 150 mg by mouth daily. Reported on 08/02/2015    . dexamethasone (DECADRON) 4 MG tablet Take 4 mg by mouth daily. Reported on 08/02/2015    . diclofenac sodium (VOLTAREN) 1 % GEL Apply 2 g topically 4 (four) times daily. 1 Tube 0  . ergocalciferol (VITAMIN D2) 50000 UNITS capsule Take 50,000 Units by mouth 2 (two) times a week. Reported on 12/27/2015    . escitalopram (LEXAPRO) 10 MG tablet Take 10 mg  by mouth daily.    Marland Kitchen esomeprazole (NEXIUM) 40 MG capsule Take 40 mg by mouth daily before breakfast.    . etodolac (LODINE) 400 MG tablet Take 400 mg by mouth 2 (two) times daily.    . fluticasone (FLOVENT HFA) 220 MCG/ACT inhaler Inhale 1-2 puffs into the lungs 2 (two) times daily.    . furosemide (LASIX) 20 MG tablet Take 1 tablet (20 mg total) by mouth daily. 90 tablet 3  . gabapentin (NEURONTIN) 600 MG tablet Take 1 tablet (600 mg total) by mouth 3 (three) times daily. 270 tablet 0  . imipramine (TOFRANIL) 10 MG tablet Take 50 mg by mouth at bedtime. Reported on 08/02/2015    . metoprolol succinate (TOPROL XL) 25 MG 24 hr tablet Take 1 tablet (25 mg total) by mouth daily. 30 tablet 11  . mupirocin cream (BACTROBAN) 2 % Apply 1 application topically  daily.    . NONFORMULARY OR COMPOUNDED ITEM Ketoprofen 20%, Ketamine 4%, Amitriptyline 5% compounded cream 60 each 1  . nystatin-triamcinolone (MYCOLOG II) cream Apply 1 application topically 2 (two) times daily. To rash at groin and breast area as directed    . potassium chloride SA (KLOR-CON M20) 20 MEQ tablet Take 1 tablet (20 mEq total) by mouth daily. 90 tablet 3  . simvastatin (ZOCOR) 10 MG tablet Take 1 tablet (10 mg total) by mouth at bedtime. 90 tablet 3  . tapentadol (NUCYNTA) 50 MG TABS tablet Take 1 tablet (50 mg total) by mouth 2 (two) times daily as needed. 90 tablet 0  . allopurinol (ZYLOPRIM) 100 MG tablet Take 100 mg by mouth daily.    . clotrimazole-betamethasone (LOTRISONE) lotion Apply 1 application topically 2 (two) times daily. To rash at breast and groin area as directed    . HYDROcodone-acetaminophen (NORCO) 10-325 MG tablet Take 1 tablet by mouth every 6 (six) hours as needed for moderate pain. Reported on 12/27/2015     No facility-administered medications prior to visit.      Allergies:   Review of patient's allergies indicates no known allergies.   Social History   Social History  . Marital status: Widowed    Spouse name: N/A  . Number of children: N/A  . Years of education: N/A   Social History Main Topics  . Smoking status: Former Games developer  . Smokeless tobacco: Never Used  . Alcohol use No  . Drug use: No  . Sexual activity: Not Asked   Other Topics Concern  . None   Social History Narrative  . None     Family History:  The patient's family history includes Heart attack in her mother; Heart disease in her mother; Stomach cancer in her father.   ROS:   Please see the history of present illness.    ROS All other systems reviewed and are negative.  No flowsheet data found.     PHYSICAL EXAM:   VS:  BP (!) 150/76   Pulse 77   Ht 5\' 2"  (1.575 m)   Wt 232 lb 3.2 oz (105.3 kg)   BMI 42.47 kg/m    GEN: Well nourished, well developed, in no  acute distress  HEENT: normal  Neck: no JVD, carotid bruits, or masses Cardiac: RRR; no murmurs, rubs, or gallops,no edema.  Intact distal pulses bilaterally.  Respiratory:  clear to auscultation bilaterally, normal work of breathing GI: soft, nontender, nondistended, + BS MS: no deformity or atrophy  Skin: warm and dry, no rash Neuro:  Alert and  Oriented x 3, Strength and sensation are intact Psych: euthymic mood, full affect  Wt Readings from Last 3 Encounters:  05/03/16 232 lb 3.2 oz (105.3 kg)  08/23/15 230 lb (104.3 kg)  08/03/15 236 lb (107 kg)      Studies/Labs Reviewed:   EKG:  EKG is not ordered today.    Recent Labs: 09/07/2015: ALT 16; BUN 18; Creat 1.16; Hemoglobin 12.5; Platelets 184; Potassium 4.3; Sodium 137; TSH 2.16   Lipid Panel    Component Value Date/Time   CHOL 145 03/29/2015 1157   TRIG 135.0 03/29/2015 1157   HDL 52.80 03/29/2015 1157   CHOLHDL 3 03/29/2015 1157   VLDL 27.0 03/29/2015 1157   LDLCALC 65 03/29/2015 1157    Additional studies/ records that were reviewed today include:  CPAP download    ASSESSMENT:    1. Coronary artery disease involving native coronary artery of native heart without angina pectoris   2. Chronic diastolic CHF (congestive heart failure) (HCC)   3. Essential hypertension   4. Persistent atrial fibrillation (HCC)   5. OSA (obstructive sleep apnea)   6. Pure hypercholesterolemia      PLAN:  In order of problems listed above:  1. ASCAD - she has been having pains in her chest along with SOB and nausea.  She also has been vomiting and not eating for the past 4 days. Unclear if this is USAP or related to GI etiology.  She appears uncomfortable in the office.  EKG shows chronic RBBB but otherwise is nonischemic.  Will transfer by EMS to [facility] ER for further evaluation.  Will need admission for chest pain workup.  Recommend [facility] admission with cardiology consulting.   2. Chronic diastolic CHF - she appears euvolemic on  exam. Continue BB and diuretics. Check BNP. 3. HTN - BP controlled on current meds.  Continue amlodipine/BB. 4. Persistent atrial fibrillation maintaining NSR. OSA - the patient is tolerating PAP therapy well without any problems. The PAP download was reviewed today and showed an AHI of 1.4/hr on 16 cm H2O with 80% compliance in using more than 4 hours nightly.  The patient has been using and benefiting from CPAP use and will continue to benefit from therapy but needs a new mask. Hyperlipidemia - LDL goal < 70.  Continue statin.  Check FLP and ALT.      Medication Adjustments/Labs and Tests Ordered: Current medicines are reviewed at length with the patient today.  Concerns regarding medicines are outlined above.  Medication changes, Labs and Tests ordered today are listed in the Patient Instructions below.  There are no Patient Instructions on file for this visit.   Signed, Armanda Magic, MD  05/03/2016 3:52 PM    [facility] [address] [place], [place], [place]  [address] Phone: [phone]; Fax: [phone]

## 2016-05-03 NOTE — H&P (Addendum)
History and Physical    Lisa [name] ZOX:[id number] DOB: Jul 12, 1940 DOA: 05/03/2016  PCP: Lisa Slimmer, MD   Patient coming from: Cardiology Dr. Lindley Magnus Office  Chief Complaint: Chest Pain, Vomiting and Diarrhea  HPI: Lisa [name] is a 75 y.o. female with medical history significant of CAD, HTN, HLD, Chroninc Diastolic CHF, Osteoarthritis, Persistent A Fib, OSA, Diabetes Mellitus not on insulin and other comorbidities who presented went to Dr. Norris Cross office this morning for evaluation of chest pain. Patient is a very poor historian with a tremendous amount of complaints and concerns who states that she had midsternal chest pain started 2-3 weeks ago which she attributed to "gas and bloating." Stated it is worse with inspiration and states the chest pain has gotten worse the last few days. Patient states the quality is "sore" and on a severity is 9/10 which is constant and states that it radiates across the chest. She also has had a poor appetite has been poor for a while and she has previously thrown up on and off. States she has been vomiting daily recently and it was "bile color" after eating in the AM. Last emesis was yesterday. Patient states she had a loose bowel movement yesterday and has had 2 episodes of diarrhea this week and has gotten worse over the last month. States she has not had fevers but has had chills. States she has assoicated Shortness of breath along with dizziness and has and more frequent headaches. Patient states she has reccurent falls and last fall was in bath tub 3 months ago for which she did not seek evaluation. Also she states she lost 110 Some odd pounds but unable to tell me over what time frame. States the pain in her chest and abdomen she feels like similar pain as pancreatitis in the past.   ED Course: No ED Course as patient was admitted directly to the hospital.   Review of Systems: As per HPI otherwise 10 point review of systems negative.  Denied any melena or blood in stool. No changes in mood or anxiety.   Past Medical History:  Diagnosis Date  . Chronic diastolic CHF (congestive heart failure) (HCC)   . Coronary artery disease    s/p CABG  . Diabetes mellitus without complication (HCC)   . Diastolic dysfunction   . Dyslipidemia   . Hyperlipidemia   . Hypertension   . Morbid obesity (HCC)   . OSA (obstructive sleep apnea)    mild OSA with AHI 7.15 now on CPAP at 16cm H2O  . RBBB     Past Surgical History:  Procedure Laterality Date  . BACK SURGERY    . CARDIAC CATHETERIZATION    . CARDIAC SURGERY    . CARDIOVERSION N/A 08/03/2015   Procedure: CARDIOVERSION;  Surgeon: Vesta Mixer, MD;  Location[facility]COPY;  Service: Cardiovascular;  Laterality: N/A;  . CORONARY ANGIOPLASTY WITH STENT PLACEMENT    . CORONARY ARTERY BYPASS GRAFT    . KNEE SURGERY Bilateral   . ORIF ANKLE FRACTURE Right 08/19/2014   Procedure: OPEN REDUCTION INTERNAL FIXATION (ORIF) ANKLE FRACTURE;  Surgeon: Cammy Copa, MD;  Location: [facility] OR;  Service: Orthopedics;  Laterality: Right;  . rotator cuff surgery     SOCIAL HX  reports that she has quit smoking. She has never used smokeless tobacco. She reports that she does not drink alcohol or use drugs.  No Known Allergies  Family History  Problem Relation Age of Onset  . Heart  attack Mother   . Heart disease Mother   . Stomach cancer Father    Prior to Admission medications   Medication Sig Start Date End Date Taking? Authorizing Provider  amLODipine (NORVASC) 5 MG tablet Take 1 tablet (5 mg total) by mouth daily. 09/20/15  Yes Quintella Reichert, MD  apixaban (ELIQUIS) 5 MG TABS tablet Take 1 tablet (5 mg total) by mouth 2 (two) times daily. 08/03/15  Yes Quintella Reichert, MD  albuterol-ipratropium (COMBIVENT) 18-103 MCG/ACT inhaler Inhale 2 puffs into the lungs every 6 (six) hours as needed for wheezing or shortness of breath. Reported on 08/02/2015    Historical Provider, MD    buPROPion (WELLBUTRIN XL) 150 MG 24 hr tablet Take 150 mg by mouth daily. Reported on 08/02/2015    Historical Provider, MD  dexamethasone (DECADRON) 4 MG tablet Take 4 mg by mouth daily. Reported on 08/02/2015 04/14/15   Historical Provider, MD  diclofenac sodium (VOLTAREN) 1 % GEL Apply 2 g topically 4 (four) times daily. 12/27/15   Ankit Karis Juba, MD  ergocalciferol (VITAMIN D2) 50000 UNITS capsule Take 50,000 Units by mouth 2 (two) times a week. Reported on 12/27/2015    Historical Provider, MD  escitalopram (LEXAPRO) 10 MG tablet Take 10 mg by mouth daily.    Historical Provider, MD  esomeprazole (NEXIUM) 40 MG capsule Take 1 capsule (40 mg total) by mouth daily before breakfast. 05/03/16   Quintella Reichert, MD  etodolac (LODINE) 400 MG tablet Take 400 mg by mouth 2 (two) times daily.    Historical Provider, MD  fluticasone (FLOVENT HFA) 220 MCG/ACT inhaler Inhale 1-2 puffs into the lungs 2 (two) times daily.    Historical Provider, MD  furosemide (LASIX) 20 MG tablet Take 1 tablet (20 mg total) by mouth daily. 05/03/16   Quintella Reichert, MD  gabapentin (NEURONTIN) 600 MG tablet Take 1 tablet (600 mg total) by mouth 3 (three) times daily. 10/30/15   Ankit Karis Juba, MD  imipramine (TOFRANIL) 10 MG tablet Take 50 mg by mouth at bedtime. Reported on 08/02/2015    Historical Provider, MD  metoprolol succinate (TOPROL XL) 25 MG 24 hr tablet Take 1 tablet (25 mg total) by mouth daily. 05/03/16   Quintella Reichert, MD  mupirocin cream (BACTROBAN) 2 % Apply 1 application topically daily. 09/15/14   Historical Provider, MD  NONFORMULARY OR COMPOUNDED ITEM Ketoprofen 20%, Ketamine 4%, Amitriptyline 5% compounded cream 10/26/15   Ankit Karis Juba, MD  nystatin-triamcinolone (MYCOLOG II) cream Apply 1 application topically 2 (two) times daily. To rash at groin and breast area as directed    Historical Provider, MD  potassium chloride SA (KLOR-CON M20) 20 MEQ tablet Take 1 tablet (20 mEq total) by mouth daily.  05/03/16   Quintella Reichert, MD  simvastatin (ZOCOR) 10 MG tablet Take 1 tablet (10 mg total) by mouth at bedtime. 08/03/15   Quintella Reichert, MD  tapentadol (NUCYNTA) 50 MG TABS tablet Take 1 tablet (50 mg total) by mouth 2 (two) times daily as needed. 12/27/15   Ankit Karis Juba, MD    Physical Exam: Vitals:   05/03/16 1708  Pulse: 70  Resp: 20  Temp: 98.3 F (36.8 C)  TempSrc: Oral  SpO2: 92%  Weight: 104.8 kg (231 lb 0.7 oz)   Constitutional: WN/WD morbidly obese, NAD and appears calm and comfortable Eyes:  Lids and conjunctivae normal, sclerae anicteric  ENMT: External Ears, Nose appear normal. Grossly normal hearing. Poor dentition.  Neck: Appears normal, supple, no cervical masses, normal ROM, no appreciable thyromegaly or JVD Respiratory: Clear to auscultation bilaterally, no wheezing, rales, rhonchi or crackles. Normal respiratory effort and patient is not tachypenic. No accessory muscle use.  Cardiovascular: RRR, no murmurs / rubs / gallops. S1 and S2 auscultated. No extremity edema. 2+ pedal pulses.  Abdomen: Soft, tender to palpatation in mid-epigastric region, distended 2/2 body habitus. No masses palpated. No appreciable hepatosplenomegaly. Bowel sounds positive x4.  GU: Deferred. Musculoskeletal: No clubbing / cyanosis of digits/nails. No joint deformity upper and lower extremities. No contractures.  Skin: No rashes, lesions, ulcers. No induration; Warm and dry.  Neurologic: CN 2-12 grossly intact with no focal deficits. Sensation intact in all 4 Extremities. Romberg sign cerebellar reflexes not assessed.  Psychiatric: Normal judgment and insight. Alert and oriented x 3. Normal mood and appropriate affect.   Labs on Admission: Ordered and all Pending.   CBC: No results for input(s): WBC, NEUTROABS, HGB, HCT, MCV, PLT in the last 168 hours. Basic Metabolic Panel: No results for input(s): NA, K, CL, CO2, GLUCOSE, BUN, CREATININE, CALCIUM, MG, PHOS in the last 168  hours. GFR: CrCl cannot be calculated (Patient's most recent lab result is older than the maximum 21 days allowed.). Liver Function Tests: No results for input(s): AST, ALT, ALKPHOS, BILITOT, PROT, ALBUMIN in the last 168 hours. No results for input(s): LIPASE, AMYLASE in the last 168 hours. No results for input(s): AMMONIA in the last 168 hours. Coagulation Profile: No results for input(s): INR, PROTIME in the last 168 hours. Cardiac Enzymes: No results for input(s): CKTOTAL, CKMB, CKMBINDEX, TROPONINI in the last 168 hours. BNP (last 3 results) No results for input(s): PROBNP in the last 8760 hours. HbA1C: No results for input(s): HGBA1C in the last 72 hours. CBG:  Recent Labs Lab 05/03/16 1704  GLUCAP 79   Lipid Profile: No results for input(s): CHOL, HDL, LDLCALC, TRIG, CHOLHDL, LDLDIRECT in the last 72 hours. Thyroid Function Tests: No results for input(s): TSH, T4TOTAL, FREET4, T3FREE, THYROIDAB in the last 72 hours. Anemia Panel: No results for input(s): VITAMINB12, FOLATE, FERRITIN, TIBC, IRON, RETICCTPCT in the last 72 hours. Urine analysis:    Component Value Date/Time   COLORURINE AMBER (A) 08/20/2014 1130   APPEARANCEUR TURBID (A) 08/20/2014 1130   LABSPEC 1.020 08/20/2014 1130   PHURINE 7.0 08/20/2014 1130   GLUCOSEU NEGATIVE 08/20/2014 1130   HGBUR NEGATIVE 08/20/2014 1130   BILIRUBINUR SMALL (A) 08/20/2014 1130   KETONESUR 15 (A) 08/20/2014 1130   PROTEINUR 100 (A) 08/20/2014 1130   UROBILINOGEN 1.0 08/20/2014 1130   NITRITE NEGATIVE 08/20/2014 1130   LEUKOCYTESUR LARGE (A) 08/20/2014 1130   Sepsis Labs: !!!!!!!!!!!!!!!!!!!!!!!!!!!!!!!!!!!!!!!!!!!! @LABRCNTIP (procalcitonin:4,lacticidven:4) )No results found for this or any previous visit (from the past 240 hour(s)).   Radiological Exams on Admission: No results found.  EKG: Ordered and Pending  Assessment/Plan Principal Problem:   Chest pain, atypical Active Problems:   GERD (gastroesophageal  reflux disease)   Hypertension   Diabetes mellitus (HCC)   Morbid obesity (HCC)   CAD (coronary artery disease)   Chronic diastolic CHF (congestive heart failure) (HCC)   Persistent atrial fibrillation (HCC)   Nausea, vomiting and diarrhea  1. Chest Pain r/o ACS with Hx of CAD -? Pleuritic Pain as worsens with Inspiration -Chest XRay -Cycle Troponins -TSH, Mag, Phos -Obtain EKG -Echocardiogram -Pain with Oxycodone and Morphine if Needed -C/w ASA, Statin, and BB -Cardiology Consult with Dr. Mayford Knifeurner in AM  2. Daily N/V and intermittent  Diarrhea with poor po Intake  -Hx of Pancreatitis in the Past. -Zofran for Nausea -Suspect Possibly Gastroparesis vs. Enteritis vs. Other Etiology -CT Abd/Pelvis, Lipase Level, Blood Cultures -GI Cocktail -Protonix 40 mg  -Check CBC, CMP -Gentle IVF Hydration at 75 mL/hr -Nutrition Consult  3. Persistent A Fib in NSR -Tele -Echocardiogram -Continue Apixaban  4. Hypertension -Continue Amlodipine 5 mg and metoprolol Succinate 25 mg po  5. Hyperlipidemia -CMP and Lipid Panel -Continue Simvastatin 10 mg  6. DM -Previously on Insulin -SSI Sensitive Scale -HbA1c and Monitor CBG's ACHS  7. Chronic Diastolic CHF -Appeared Euvolemic -Echocardiogram -Lasix currently held as currently being hydrated with Ns; Patient states she takes it EOD -Cardiology Consulted and Following -Continue BB  8. Hx of Recurrent Falls -PT/OT Evaluate and Treat  9. Osteoarthritis -Pain control with Tylenol and Percocet  DVT prophylaxis: Apixaban Code Status: Full Family Communication:  No Family at Bedside Disposition Plan: Home with Home Health Consults called: Cardiology Admission status: Obs/Tele  Merlene Laughter, D.O. [facility] Pager [phone]  If 7PM-7AM, please contact night-coverage [email or web address] Password [facility]  05/03/2016, 7:36 PM

## 2016-05-04 ENCOUNTER — Observation Stay (HOSPITAL_COMMUNITY): Payer: Medicare Other

## 2016-05-04 ENCOUNTER — Encounter (HOSPITAL_COMMUNITY): Payer: Self-pay | Admitting: Cardiology

## 2016-05-04 DIAGNOSIS — Z7901 Long term (current) use of anticoagulants: Secondary | ICD-10-CM | POA: Diagnosis not present

## 2016-05-04 DIAGNOSIS — E785 Hyperlipidemia, unspecified: Secondary | ICD-10-CM | POA: Diagnosis not present

## 2016-05-04 DIAGNOSIS — J9601 Acute respiratory failure with hypoxia: Secondary | ICD-10-CM | POA: Diagnosis not present

## 2016-05-04 DIAGNOSIS — G4733 Obstructive sleep apnea (adult) (pediatric): Secondary | ICD-10-CM | POA: Diagnosis not present

## 2016-05-04 DIAGNOSIS — R197 Diarrhea, unspecified: Secondary | ICD-10-CM

## 2016-05-04 DIAGNOSIS — I4510 Unspecified right bundle-branch block: Secondary | ICD-10-CM | POA: Diagnosis not present

## 2016-05-04 DIAGNOSIS — E1122 Type 2 diabetes mellitus with diabetic chronic kidney disease: Secondary | ICD-10-CM | POA: Diagnosis not present

## 2016-05-04 DIAGNOSIS — R079 Chest pain, unspecified: Secondary | ICD-10-CM | POA: Diagnosis not present

## 2016-05-04 DIAGNOSIS — R0602 Shortness of breath: Secondary | ICD-10-CM | POA: Diagnosis not present

## 2016-05-04 DIAGNOSIS — R112 Nausea with vomiting, unspecified: Secondary | ICD-10-CM | POA: Diagnosis not present

## 2016-05-04 DIAGNOSIS — K219 Gastro-esophageal reflux disease without esophagitis: Secondary | ICD-10-CM | POA: Diagnosis not present

## 2016-05-04 DIAGNOSIS — I081 Rheumatic disorders of both mitral and tricuspid valves: Secondary | ICD-10-CM | POA: Diagnosis not present

## 2016-05-04 DIAGNOSIS — I130 Hypertensive heart and chronic kidney disease with heart failure and stage 1 through stage 4 chronic kidney disease, or unspecified chronic kidney disease: Secondary | ICD-10-CM | POA: Diagnosis not present

## 2016-05-04 DIAGNOSIS — R0789 Other chest pain: Secondary | ICD-10-CM | POA: Diagnosis not present

## 2016-05-04 DIAGNOSIS — N179 Acute kidney failure, unspecified: Secondary | ICD-10-CM | POA: Diagnosis not present

## 2016-05-04 DIAGNOSIS — R296 Repeated falls: Secondary | ICD-10-CM | POA: Diagnosis not present

## 2016-05-04 DIAGNOSIS — I480 Paroxysmal atrial fibrillation: Secondary | ICD-10-CM | POA: Diagnosis not present

## 2016-05-04 DIAGNOSIS — N183 Chronic kidney disease, stage 3 (moderate): Secondary | ICD-10-CM | POA: Diagnosis not present

## 2016-05-04 DIAGNOSIS — D696 Thrombocytopenia, unspecified: Secondary | ICD-10-CM | POA: Diagnosis not present

## 2016-05-04 DIAGNOSIS — N189 Chronic kidney disease, unspecified: Secondary | ICD-10-CM

## 2016-05-04 DIAGNOSIS — Z951 Presence of aortocoronary bypass graft: Secondary | ICD-10-CM | POA: Diagnosis not present

## 2016-05-04 DIAGNOSIS — I481 Persistent atrial fibrillation: Secondary | ICD-10-CM | POA: Diagnosis not present

## 2016-05-04 DIAGNOSIS — I2510 Atherosclerotic heart disease of native coronary artery without angina pectoris: Secondary | ICD-10-CM | POA: Diagnosis not present

## 2016-05-04 DIAGNOSIS — I10 Essential (primary) hypertension: Secondary | ICD-10-CM | POA: Diagnosis not present

## 2016-05-04 DIAGNOSIS — I5032 Chronic diastolic (congestive) heart failure: Secondary | ICD-10-CM | POA: Diagnosis not present

## 2016-05-04 DIAGNOSIS — D631 Anemia in chronic kidney disease: Secondary | ICD-10-CM | POA: Diagnosis not present

## 2016-05-04 DIAGNOSIS — N281 Cyst of kidney, acquired: Secondary | ICD-10-CM | POA: Diagnosis not present

## 2016-05-04 LAB — CBC WITH DIFFERENTIAL/PLATELET
BASOS ABS: 0 10*3/uL (ref 0.0–0.1)
BASOS PCT: 1 %
Eosinophils Absolute: 0.1 10*3/uL (ref 0.0–0.7)
Eosinophils Relative: 2 %
HEMATOCRIT: 28.9 % — AB (ref 36.0–46.0)
HEMOGLOBIN: 9.6 g/dL — AB (ref 12.0–15.0)
Lymphocytes Relative: 27 %
Lymphs Abs: 1.7 10*3/uL (ref 0.7–4.0)
MCH: 31.6 pg (ref 26.0–34.0)
MCHC: 33.2 g/dL (ref 30.0–36.0)
MCV: 95.1 fL (ref 78.0–100.0)
Monocytes Absolute: 0.5 10*3/uL (ref 0.1–1.0)
Monocytes Relative: 9 %
NEUTROS ABS: 3.9 10*3/uL (ref 1.7–7.7)
NEUTROS PCT: 61 %
Platelets: 146 10*3/uL — ABNORMAL LOW (ref 150–400)
RBC: 3.04 MIL/uL — ABNORMAL LOW (ref 3.87–5.11)
RDW: 13.1 % (ref 11.5–15.5)
WBC: 6.4 10*3/uL (ref 4.0–10.5)

## 2016-05-04 LAB — BASIC METABOLIC PANEL
ANION GAP: 10 (ref 5–15)
BUN: 8 mg/dL (ref 6–20)
CALCIUM: 8.1 mg/dL — AB (ref 8.9–10.3)
CO2: 23 mmol/L (ref 22–32)
Chloride: 104 mmol/L (ref 101–111)
Creatinine, Ser: 1.22 mg/dL — ABNORMAL HIGH (ref 0.44–1.00)
GFR calc Af Amer: 48 mL/min — ABNORMAL LOW (ref >60)
GFR, EST NON AFRICAN AMERICAN: 42 mL/min — AB (ref >60)
Glucose, Bld: 79 mg/dL (ref 65–99)
POTASSIUM: 3.6 mmol/L (ref 3.5–5.1)
SODIUM: 137 mmol/L (ref 135–145)

## 2016-05-04 LAB — GLUCOSE, CAPILLARY
GLUCOSE-CAPILLARY: 81 mg/dL (ref 65–99)
Glucose-Capillary: 82 mg/dL (ref 65–99)
Glucose-Capillary: 119 mg/dL — ABNORMAL HIGH (ref 65–99)
Glucose-Capillary: 164 mg/dL — ABNORMAL HIGH (ref 65–99)

## 2016-05-04 LAB — PHOSPHORUS: PHOSPHORUS: 3.3 mg/dL (ref 2.5–4.6)

## 2016-05-04 LAB — LIPID PANEL
Cholesterol: 133 mg/dL (ref 0–200)
HDL: 36 mg/dL — ABNORMAL LOW (ref >40)
LDL CALC: 76 mg/dL (ref 0–99)
Total CHOL/HDL Ratio: 3.7 RATIO
Triglycerides: 106 mg/dL (ref <150)
VLDL: 21 mg/dL (ref 0–40)

## 2016-05-04 LAB — TROPONIN I: Troponin I: 0.04 ng/mL (ref <0.03)

## 2016-05-04 LAB — MAGNESIUM: Magnesium: 2 mg/dL (ref 1.7–2.4)

## 2016-05-04 MED ORDER — ATORVASTATIN CALCIUM 10 MG PO TABS
10.0000 mg | ORAL_TABLET | Freq: Every day | ORAL | Status: DC
  Administered 2016-05-04 – 2016-05-08 (×5): 10 mg via ORAL
  Filled 2016-05-04 (×5): qty 1

## 2016-05-04 MED ORDER — ENSURE ENLIVE PO LIQD
237.0000 mL | Freq: Two times a day (BID) | ORAL | Status: DC
  Administered 2016-05-04 – 2016-05-09 (×6): 237 mL via ORAL

## 2016-05-04 NOTE — Progress Notes (Signed)
PT Cancellation Note  Patient Details Name: Lisa [name] MRN: [id number] DOB: Feb 04, 1941   Cancelled Treatment:    Reason Eval/Treat Not Completed: Other (comment) (Pt currently with physician. ) Will check back as able.   Terita Hejl 05/04/2016, 2:31 PM [facility] Moise PT [phone]

## 2016-05-04 NOTE — Progress Notes (Addendum)
PROGRESS NOTE  Lisa [name]  ZOX:[id number] DOB: 1941-01-31  DOA: 05/03/2016 PCP: [name],[name], MD   Brief Narrative:  75 year old female with PMH of CAD, HTN, HLD, chronic diastolic CHF, osteoarthritis, persistent A. fib, OSA, DM not on insulin presented from Dr. Turner/cardiologist office to ED on 05/03/16 for evaluation of chest pain. Poor historian limited history taking. Chest pain described as worse with chest wall movements and deep inspiration. Associated couple days history of nausea, vomiting and diarrhea. Cardiologist consulting.   Assessment & Plan:   Principal Problem:   Chest pain, atypical Active Problems:   GERD (gastroesophageal reflux disease)   Hypertension   Diabetes mellitus (HCC)   Morbid obesity (HCC)   CAD (coronary artery disease)   Chronic diastolic CHF (congestive heart failure) (HCC)   Persistent atrial fibrillation (HCC)   Nausea, vomiting and diarrhea   1. Chest pain: Seems very atypical suspicious for musculoskeletal etiology and is worse with chest wall movements or deep inspiration and is reproducible to palpation. Does not even seem like GI in etiology. Cardiology consultation appreciated. Troponin minimally elevated but flat trend. EKG in office nonischemic with chronic RBBB. Cardiology getting 2-D echo to assess for wall motion abnormality. If normal then plan outpatient nuclear stress test. 2. Nausea, vomiting and intermittent diarrhea: Unclear etiology. Denies sick contacts. Lipase normal. CT abdomen and pelvis pending. LFTs unremarkable. 3. Hypokalemia: Replace and follow. Magnesium normal. 4. Type II DM: Continue SSI. Check A1c. 5. CAD status post remote CABG: Management as above. 6. Chronic diastolic CHF: Appears compensated. Lasix currently held secondary to poor oral intake and GI losses. 7. Hyperlipidemia: Simvastatin changed to Lipitor since she is on amlodipine. 8. Essential hypertension: Controlled on beta blockers and  amlodipine. 9. OSA: Has not been using her CPAP at home because she needs a new mask. 10. Persistent A. fib: Maintaining sinus rhythm. Remains on Toprol and Apixaban 11. Recurrent falls. PT and OT evaluation. 12. Anemia: Stable. Follow CBCs. Mild thrombocytopenia.   DVT prophylaxis: Apixaban Code Status: Full Family Communication: None at bedside Disposition Plan: To be determined   Consultants:   Cardiology  Procedures:   None  Antimicrobials:   None    Subjective: Continues to complain of chest pain that's worse with chest wall movements, deep inspiration and is reproducible to palpation (patient's female RN is in the room during testing). Denies cough, dizziness, lightheadedness. No further nausea or vomiting since yesterday. Had a loose BM yesterday but no BM since.  Objective:  Vitals:   05/04/16 0418 05/04/16 0817 05/04/16 1017 05/04/16 1442  BP: (!) 137/43  (!) 154/52 (!) 144/45  Pulse: 85   71  Resp: 18   17  Temp: 98.2 F (36.8 C)   98.6 F (37 C)  TempSrc: Oral   Oral  SpO2: 90% 92%  92%  Weight: 105.4 kg (232 lb 6.4 oz)     Height:        Intake/Output Summary (Last 24 hours) at 05/04/16 1807 Last data filed at 05/04/16 1736  Gross per 24 hour  Intake              240 ml  Output                0 ml  Net              240 ml   Filed Weights   05/03/16 1708 05/04/16 0418  Weight: 104.8 kg (231 lb 0.7 oz) 105.4 kg (232 lb 6.4 oz)  Examination:  General exam: Pleasant elderly female lying comfortably propped up in bed. Respiratory system: Clear to auscultation. Respiratory effort normal. Reproducible chest wall tenderness. Cardiovascular system: S1 & S2 heard, RRR. No JVD, murmurs, rubs, gallops or clicks. No pedal edema. Telemetry: Sinus rhythm with BBB morphology. Gastrointestinal system: Abdomen is nondistended, soft and nontender. No organomegaly or masses felt. Normal bowel sounds heard. Central nervous system: Alert and oriented. No focal  neurological deficits. Extremities: Symmetric 5 x 5 power. Skin: No rashes, lesions or ulcers Psychiatry: Judgement and insight appear impaired & affect appropriate.     Data Reviewed: I have personally reviewed following labs and imaging studies  CBC:  Recent Labs Lab 05/03/16 1859 05/04/16 0219  WBC 6.2 6.4  NEUTROABS 3.8 3.9  HGB 10.2* 9.6*  HCT 30.6* 28.9*  MCV 95.3 95.1  PLT 152 146*   Basic Metabolic Panel:  Recent Labs Lab 05/03/16 1859 05/03/16 2215 05/04/16 0219 05/04/16 0804  NA 138 140  --  137  K 2.9* 3.0*  --  3.6  CL 103 106  --  104  CO2 24 26  --  23  GLUCOSE 76 89  --  79  BUN 8 7  --  8  CREATININE 1.27* 1.19*  --  1.22*  CALCIUM 8.6* 8.4*  --  8.1*  MG 1.4*  --  2.0  --   PHOS 3.0  --  3.3  --    GFR: Estimated Creatinine Clearance: 44 mL/min (by C-G formula based on SCr of 1.22 mg/dL (H)). Liver Function Tests:  Recent Labs Lab 05/03/16 1859 05/03/16 2215  AST 22 19  ALT 8* 8*  ALKPHOS 43 41  BILITOT 1.3* 1.0  PROT 6.2* 5.6*  ALBUMIN 2.8* 2.5*    Recent Labs Lab 05/03/16 2215  LIPASE 21   No results for input(s): AMMONIA in the last 168 hours. Coagulation Profile:  Recent Labs Lab 05/03/16 1859  INR 1.34   Cardiac Enzymes:  Recent Labs Lab 05/03/16 1859 05/03/16 2215 05/04/16 0219  TROPONINI 0.04* 0.03* 0.04*   BNP (last 3 results) No results for input(s): PROBNP in the last 8760 hours. HbA1C: No results for input(s): HGBA1C in the last 72 hours. CBG:  Recent Labs Lab 05/03/16 1704 05/03/16 2136 05/04/16 0631 05/04/16 1143 05/04/16 1610  GLUCAP 79 90 82 81 119*   Lipid Profile:  Recent Labs  05/04/16 0219  CHOL 133  HDL 36*  LDLCALC 76  TRIG 161  CHOLHDL 3.7   Thyroid Function Tests: No results for input(s): TSH, T4TOTAL, FREET4, T3FREE, THYROIDAB in the last 72 hours. Anemia Panel: No results for input(s): VITAMINB12, FOLATE, FERRITIN, TIBC, IRON, RETICCTPCT in the last 72 hours.  Sepsis  Labs: No results for input(s): PROCALCITON, LATICACIDVEN in the last 168 hours.  No results found for this or any previous visit (from the past 240 hour(s)).       Radiology Studies: No results found.      Scheduled Meds: . amLODipine  5 mg Oral Daily  . apixaban  5 mg Oral BID  . aspirin EC  81 mg Oral Daily  . atorvastatin  10 mg Oral q1800  . budesonide  0.25 mg Nebulization BID  . buPROPion  150 mg Oral Daily  . feeding supplement (ENSURE ENLIVE)  237 mL Oral BID BM  . gabapentin  600 mg Oral TID  . insulin aspart  0-9 Units Subcutaneous TID WC  . metoprolol succinate  25 mg Oral Daily  .  pantoprazole  40 mg Oral Daily  . sodium chloride flush  3 mL Intravenous Q12H  . [START ON 05/06/2016] Vitamin D (Ergocalciferol)  50,000 Units Oral Once per day on Mon Thu   Continuous Infusions: . sodium chloride 75 mL/hr at 05/03/16 1903     LOS: 1 day     [facility] Partners In Healthcare IncNGALGI,Murl Zogg, MD [facility] Pager [phone]  If 7PM-7AM, please contact night-coverage [email or web address] Password [email or web address] 05/04/2016, 6:07 PM

## 2016-05-04 NOTE — Progress Notes (Signed)
CBG machine not transferring over data.CN-kristin aware

## 2016-05-04 NOTE — Consult Note (Signed)
Cardiology Office Note    Date:  05/03/2016   ID:  Lisa [name], DOB [birth year], MRN [id number]  PCP:  Laurena Slimmer, MD        Cardiologist:  Armanda Magic, MD      Chief Complaint  Patient presents with  . Coronary Artery Disease  . Hypertension  . Sleep Apnea  . Atrial Fibrillation    History of Present Illness:  Lisa [name] is a 75 y.o. female with a history of CAD, HTN, dyslipidemia, diastolic dysfunction, chronic diastolic CHF. She denies any  palpitations, or syncope. She has history of chronic LE edema but that has resolved. She has been having chest pain intermittently for several weeks.  She cannot describe the pain but it midsternal with radiation into the right shoulder.  This is the same discomfort that she had when she was diagnosed with CAD. She has had worsening of her DOE and can barely walk across the room.  The chest discomfort lasts a few minutes and resolves on its own.  She gets nauseated when she gets the discomfort.  She says that she has been vomiting and had diarrhea off and on with eating and has not eaten in 4 days.  She has not been using her CPAP because she needs a new mask.         Past Medical History:  Diagnosis Date  . Chronic diastolic CHF (congestive heart failure) (HCC)   . Coronary artery disease    s/p CABG  . Diabetes mellitus without complication (HCC)   . Diastolic dysfunction   . Dyslipidemia   . Hyperlipidemia   . Hypertension   . Morbid obesity (HCC)   . OSA (obstructive sleep apnea)    mild OSA with AHI 7.15 now on CPAP at 16cm H2O  . RBBB          Past Surgical History:  Procedure Laterality Date  . BACK SURGERY    . CARDIAC CATHETERIZATION    . CARDIAC SURGERY    . CARDIOVERSION N/A 08/03/2015   Procedure: CARDIOVERSION;  Surgeon: Vesta Mixer, MD;  Location: [facility] ENDOSCOPY;  Service: Cardiovascular;  Laterality: N/A;  . CORONARY ANGIOPLASTY WITH STENT PLACEMENT    .  CORONARY ARTERY BYPASS GRAFT    . KNEE SURGERY Bilateral   . ORIF ANKLE FRACTURE Right 08/19/2014   Procedure: OPEN REDUCTION INTERNAL FIXATION (ORIF) ANKLE FRACTURE;  Surgeon: Cammy Copa, MD;  Location: [facility] OR;  Service: Orthopedics;  Laterality: Right;  . rotator cuff surgery      Current Medications:       Outpatient Medications Prior to Visit  Medication Sig Dispense Refill  . albuterol-ipratropium (COMBIVENT) 18-103 MCG/ACT inhaler Inhale 2 puffs into the lungs every 6 (six) hours as needed for wheezing or shortness of breath. Reported on 08/02/2015    . amLODipine (NORVASC) 5 MG tablet Take 1 tablet (5 mg total) by mouth daily. 30 tablet 11  . apixaban (ELIQUIS) 5 MG TABS tablet Take 1 tablet (5 mg total) by mouth 2 (two) times daily. 180 tablet 3  . buPROPion (WELLBUTRIN XL) 150 MG 24 hr tablet Take 150 mg by mouth daily. Reported on 08/02/2015    . dexamethasone (DECADRON) 4 MG tablet Take 4 mg by mouth daily. Reported on 08/02/2015    . diclofenac sodium (VOLTAREN) 1 % GEL Apply 2 g topically 4 (four) times daily. 1 Tube 0  . ergocalciferol (VITAMIN D2) 50000 UNITS capsule Take 50,000 Units by mouth  2 (two) times a week. Reported on 12/27/2015    . escitalopram (LEXAPRO) 10 MG tablet Take 10 mg by mouth daily.    Marland Kitchen. esomeprazole (NEXIUM) 40 MG capsule Take 40 mg by mouth daily before breakfast.    . etodolac (LODINE) 400 MG tablet Take 400 mg by mouth 2 (two) times daily.    . fluticasone (FLOVENT HFA) 220 MCG/ACT inhaler Inhale 1-2 puffs into the lungs 2 (two) times daily.    . furosemide (LASIX) 20 MG tablet Take 1 tablet (20 mg total) by mouth daily. 90 tablet 3  . gabapentin (NEURONTIN) 600 MG tablet Take 1 tablet (600 mg total) by mouth 3 (three) times daily. 270 tablet 0  . imipramine (TOFRANIL) 10 MG tablet Take 50 mg by mouth at bedtime. Reported on 08/02/2015    . metoprolol succinate (TOPROL XL) 25 MG 24 hr tablet Take 1 tablet (25 mg total) by  mouth daily. 30 tablet 11  . mupirocin cream (BACTROBAN) 2 % Apply 1 application topically daily.    . NONFORMULARY OR COMPOUNDED ITEM Ketoprofen 20%, Ketamine 4%, Amitriptyline 5% compounded cream 60 each 1  . nystatin-triamcinolone (MYCOLOG II) cream Apply 1 application topically 2 (two) times daily. To rash at groin and breast area as directed    . potassium chloride SA (KLOR-CON M20) 20 MEQ tablet Take 1 tablet (20 mEq total) by mouth daily. 90 tablet 3  . simvastatin (ZOCOR) 10 MG tablet Take 1 tablet (10 mg total) by mouth at bedtime. 90 tablet 3  . tapentadol (NUCYNTA) 50 MG TABS tablet Take 1 tablet (50 mg total) by mouth 2 (two) times daily as needed. 90 tablet 0  . allopurinol (ZYLOPRIM) 100 MG tablet Take 100 mg by mouth daily.    . clotrimazole-betamethasone (LOTRISONE) lotion Apply 1 application topically 2 (two) times daily. To rash at breast and groin area as directed    . HYDROcodone-acetaminophen (NORCO) 10-325 MG tablet Take 1 tablet by mouth every 6 (six) hours as needed for moderate pain. Reported on 12/27/2015     No facility-administered medications prior to visit.      Allergies:   Review of patient's allergies indicates no known allergies.   Social History        Social History  . Marital status: Widowed    Spouse name: N/A  . Number of children: N/A  . Years of education: N/A       Social History Main Topics  . Smoking status: Former Games developermoker  . Smokeless tobacco: Never Used  . Alcohol use No  . Drug use: No  . Sexual activity: Not Asked       Other Topics Concern  . None      Social History Narrative  . None     Family History:  The patient's family history includes Heart attack in her mother; Heart disease in her mother; Stomach cancer in her father.   ROS:   Please see the history of present illness.    ROS All other systems reviewed and are negative.  No flowsheet data found.     PHYSICAL EXAM:   VS:  BP (!)  150/76   Pulse 77   Ht 5\' 2"  (1.575 m)   Wt 232 lb 3.2 oz (105.3 kg)   BMI 42.47 kg/m    GEN: Well nourished, well developed, in no acute distress  HEENT: normal  Neck: no JVD, carotid bruits, or masses Cardiac: RRR; no murmurs, rubs, or gallops,no edema.  Intact  distal pulses bilaterally.  Respiratory:  clear to auscultation bilaterally, normal work of breathing GI: soft, nontender, nondistended, + BS MS: no deformity or atrophy  Skin: warm and dry, no rash Neuro:  Alert and Oriented x 3, Strength and sensation are intact Psych: euthymic mood, full affect     Wt Readings from Last 3 Encounters:  05/03/16 232 lb 3.2 oz (105.3 kg)  08/23/15 230 lb (104.3 kg)  08/03/15 236 lb (107 kg)      Studies/Labs Reviewed:   EKG:  EKG is not ordered today.    Recent Labs: 09/07/2015: ALT 16; BUN 18; Creat 1.16; Hemoglobin 12.5; Platelets 184; Potassium 4.3; Sodium 137; TSH 2.16   Lipid Panel Labs (Brief)          Component Value Date/Time   CHOL 145 03/29/2015 1157   TRIG 135.0 03/29/2015 1157   HDL 52.80 03/29/2015 1157   CHOLHDL 3 03/29/2015 1157   VLDL 27.0 03/29/2015 1157   LDLCALC 65 03/29/2015 1157      Additional studies/ records that were reviewed today include:  CPAP download    ASSESSMENT:    1. Coronary artery disease involving native coronary artery of native heart without angina pectoris   2. Chronic diastolic CHF (congestive heart failure) (HCC)   3. Essential hypertension   4. Persistent atrial fibrillation (HCC)   5. OSA (obstructive sleep apnea)   6. Pure hypercholesterolemia      PLAN:  In order of problems listed above:  1. ASCAD - she has been having pains in her chest along with SOB and nausea.  She also has been vomiting and not eating for the past 4 days. Unclear if this is USAP or related to GI etiology.  She appears uncomfortable in the office.  EKG shows chronic RBBB but otherwise is nonischemic.  [facility].  Will need admission for chest pain workup.  Recommend [facility] admission with cardiology consulting.   2. Chronic diastolic CHF - she appears euvolemic on exam. Continue BB and diuretics. Check BNP. 3. HTN - BP controlled on current meds.  Continue amlodipine/BB. 4. Persistent atrial fibrillation maintaining NSR. 5. OSA - the patient is tolerating PAP therapy well without any problems. The PAP download was reviewed today and showed an AHI of 1.4/hr on 16 cm H2O with 80% compliance in using more than 4 hours nightly.  The patient has been using and benefiting from CPAP use and will continue to benefit from therapy but needs a new mask. 6. Hyperlipidemia - LDL goal < 70.  Continue statin.  Check FLP and ALT.      Medication Adjustments/Labs and Tests Ordered: Current medicines are reviewed at length with the patient today.  Concerns regarding medicines are outlined above.  Medication changes, Labs and Tests ordered today are listed in the Patient Instructions below.  There are no Patient Instructions on file for this visit.   Signed, Armanda Magicraci Myers Tutterow, MD  05/03/2016 3:52 PM    [facility] [address], [place], [place]  [address] Phone: [phone]) [phone]; Fax: [phone]

## 2016-05-04 NOTE — Progress Notes (Signed)
Patient Name: Lisa [name] Date of Encounter: 05/04/2016  Primary Cardiologist: Dr. Nicole Cellaraci Zacary [name]  Hospital Problem List     Principal Problem:   Chest pain, atypical Active Problems:   GERD (gastroesophageal reflux disease)   Hypertension   Diabetes mellitus (HCC)   Morbid obesity (HCC)   CAD (coronary artery disease)   Chronic diastolic CHF (congestive heart failure) (HCC)   Persistent atrial fibrillation (HCC)   Nausea, vomiting and diarrhea     Subjective   Still complains of sharp pain in her chest with inspiration and movement  Inpatient Medications    Scheduled Meds: . amLODipine  5 mg Oral Daily  . apixaban  5 mg Oral BID  . aspirin EC  81 mg Oral Daily  . budesonide  0.25 mg Nebulization BID  . buPROPion  150 mg Oral Daily  . escitalopram  10 mg Oral Daily  . gabapentin  600 mg Oral TID  . imipramine  50 mg Oral QHS  . insulin aspart  0-9 Units Subcutaneous TID WC  . metoprolol succinate  25 mg Oral Daily  . pantoprazole  40 mg Oral Daily  . simvastatin  10 mg Oral QHS  . sodium chloride flush  3 mL Intravenous Q12H  . [START ON 05/06/2016] Vitamin D (Ergocalciferol)  50,000 Units Oral Once per day on Mon Thu   Continuous Infusions: . sodium chloride 75 mL/hr at 05/03/16 1903   PRN Meds: acetaminophen **OR** acetaminophen, acetaminophen, gi cocktail, ipratropium-albuterol, ondansetron (ZOFRAN) IV, oxyCODONE   Vital Signs    Vitals:   05/04/16 0005 05/04/16 0418 05/04/16 0817 05/04/16 1017  BP:  (!) 137/43  (!) 154/52  Pulse:  85    Resp: 18 18    Temp:  98.2 F (36.8 C)    TempSrc:  Oral    SpO2:  90% 92%   Weight:  232 lb 6.4 oz (105.4 kg)    Height:        Intake/Output Summary (Last 24 hours) at 05/04/16 1130 Last data filed at 05/04/16 0842  Gross per 24 hour  Intake                0 ml  Output                0 ml  Net                0 ml   Filed Weights   05/03/16 1708 05/04/16 0418  Weight: 231 lb 0.7 oz (104.8 kg) 232 lb  6.4 oz (105.4 kg)    Physical Exam    GEN: Well nourished, well developed, in no acute distress.  HEENT: Grossly normal.  Neck: Supple, no JVD, carotid bruits, or masses. Cardiac: RRR, no murmurs, rubs, or gallops. No clubbing, cyanosis, edema.  Radials/DP/PT 2+ and equal bilaterally.  Respiratory:  Respirations regular and unlabored, clear to auscultation bilaterally. GI: Soft, nontender, nondistended, BS + x 4. MS: no deformity or atrophy. Skin: warm and dry, no rash. Neuro:  Strength and sensation are intact. Psych: AAOx3.  Normal affect.  Labs    CBC  Recent Labs  05/03/16 1859 05/04/16 0219  WBC 6.2 6.4  NEUTROABS 3.8 3.9  HGB 10.2* 9.6*  HCT 30.6* 28.9*  MCV 95.3 95.1  PLT 152 146*   Basic Metabolic Panel  Recent Labs  05/03/16 1859 05/03/16 2215 05/04/16 0219 05/04/16 0804  NA 138 140  --  137  K 2.9* 3.0*  --  3.6  CL 103 106  --  104  CO2 24 26  --  23  GLUCOSE 76 89  --  79  BUN 8 7  --  8  CREATININE 1.27* 1.19*  --  1.22*  CALCIUM 8.6* 8.4*  --  8.1*  MG 1.4*  --  2.0  --   PHOS 3.0  --  3.3  --    Liver Function Tests  Recent Labs  05/03/16 1859 05/03/16 2215  AST 22 19  ALT 8* 8*  ALKPHOS 43 41  BILITOT 1.3* 1.0  PROT 6.2* 5.6*  ALBUMIN 2.8* 2.5*    Recent Labs  05/03/16 2215  LIPASE 21   Cardiac Enzymes  Recent Labs  05/03/16 1859 05/03/16 2215 05/04/16 0219  TROPONINI 0.04* 0.03* 0.04*   BNP Invalid input(s): POCBNP D-Dimer No results for input(s): DDIMER in the last 72 hours. Hemoglobin A1C No results for input(s): HGBA1C in the last 72 hours. Fasting Lipid Panel  Recent Labs  05/04/16 0219  CHOL 133  HDL 36*  LDLCALC 76  TRIG 161106  CHOLHDL 3.7   Thyroid Function Tests No results for input(s): TSH, T4TOTAL, T3FREE, THYROIDAB in the last 72 hours.  Invalid input(s): FREET3  Telemetry     - Personally Reviewed  ECG     - Personally Reviewed  Radiology    No results found.  Cardiac Studies    none  Patient Profile     Lisa LoaderGrace A McKinneyis a 75 y.o.femalewith a history of CAD, HTN, dyslipidemia, diastolic dysfunction, chronic diastolic CHF who presented to the office yesterday with SOB, chest pain, N/V, decreased PO intake and diarrhea and was admitted for further evaluation.   Assessment & Plan    1.  Chest pain with  atypical features.  It is midsternal but sharp and occurs more with deep breathing and movement.  She has been having worsening SOB recently.  Cxray pending.  With associated N/V ? Whether this could be diabetic gastroparesis.  Cardiac enzymes minimally elevated with flat trend.  EKG in office non ischemic with chronic RBBB.  Will get 2D echo to assess for wall motion abnormalities.  If normal then plan outpt nuclear stress test.  2.  ASCAD with remote CABG. 3.  Chronic diastolic CHF - she does not appear volume overloaded on exam.   4.  Hyperlipidemia - LDL goal < 70. Last LDL 76.  Change simvastatin to Lipitor 10mg  daily since she is on amlodipine. 5.  HTN - BP controlled on current meds. Continue BB and amlodipine.  6.  OSA - she has not been using her CPAP at home because she needs a new mask.  7.  N/V with decreased PO intake for several days of ? Etiology.  LFTs are normal but albumin is very low at 2.5.  She is also anemic. Workup per [facility]. 8.  Persistent atrial fibrillation maintaining NSR.  Continue Apixaban and Toprol.  Signed, Lisa Magicraci Janiece Scovill, MD  05/04/2016, 11:30 AM

## 2016-05-04 NOTE — Progress Notes (Signed)
Initial Nutrition Assessment  DOCUMENTATION CODES:  Morbid obesity  INTERVENTION:  Ensure Enlive po BID, each supplement provides 350 kcal and 20 grams of protein  NUTRITION DIAGNOSIS:  Inadequate oral intake related to poor appetite as evidenced by per patient/family report.  GOAL:  Patient will meet greater than or equal to 90% of their needs  MONITOR:  Supplement acceptance, PO intake, Labs  REASON FOR ASSESSMENT:  Consult Assessment of nutrition requirement/status + Poor appetite + Diet education  ASSESSMENT:  75 y/o female PMHx CAD, HTN, HLD, CHF, Osteoarthritis, Afib, OSA, DM. Presents with Chest pain x 2-3 weeks, poor appetite for "a while" and recently vomiting/diarrhea. Admitted to rule out ACS.   Pt reports a chronic loss of appetite. She notes it has been going on for a long time, but cannot exactly specify the length. She says she has not eaten in 4 days. She apparently had soup 4 days ago, which gave her diarrhea and she has not eaten since. She says she has not had diarrhea today.   She is not sure why she does not have an appetite. She says she "goes to bed without dinner" typically. Her daughter is the one who prepares her food. RD offered her multiple foods, but she could not think of a single item she was hungry for. She is very thirsty and reports her mouth is dry.   She does not know what her UBW is. She says she "keeps thinking back to the times she weighed 350 lbs". When asked when this was she states "[place] Suspect she has not weighed this amount in a long time. Chart review shows a stable weight this year, though there is a lot of fluctuation, likely due to CHF and diuretics.   Assessment- pt is a poor historian. She may have poor appetite just due to body adaption to little PO intake. She does not have weight loss or any signs of fat wasting.   Pt agreeable to Ensure Enlive until she begins eating meals again.   NFPE: Morbidly  obese  Medications: Insulin, ppi, vitamin d Labs: BG well controlled. Mostly WDL, albumin: 2.5-this is chronicly depressed-likely related to chronic inflammatory conditions  Recent Labs Lab 05/03/16 1859 05/03/16 2215 05/04/16 0219 05/04/16 0804  NA 138 140  --  137  K 2.9* 3.0*  --  3.6  CL 103 106  --  104  CO2 24 26  --  23  BUN 8 7  --  8  CREATININE 1.27* 1.19*  --  1.22*  CALCIUM 8.6* 8.4*  --  8.1*  MG 1.4*  --  2.0  --   PHOS 3.0  --  3.3  --   GLUCOSE 76 89  --  79   Diet Order:  Diet Carb Modified Fluid consistency: Thin; Room service appropriate? Yes  Skin:  Reviewed, no issues  Last BM:  10/27  Height:  Ht Readings from Last 1 Encounters:  05/03/16 5\' 2"  (1.575 m)   Weight:  Wt Readings from Last 1 Encounters:  05/04/16 232 lb 6.4 oz (105.4 kg)   Wt Readings from Last 10 Encounters:  05/04/16 232 lb 6.4 oz (105.4 kg)  05/03/16 232 lb 3.2 oz (105.3 kg)  08/23/15 230 lb (104.3 kg)  08/03/15 236 lb (107 kg)  07/13/15 236 lb 6.4 oz (107.2 kg)  04/24/15 243 lb 6.4 oz (110.4 kg)  03/21/15 226 lb (102.5 kg)  09/20/14 247 lb 12.8 oz (112.4 kg)  08/23/14 269 lb 6.4 oz (122.2 kg)  03/22/14 243 lb (110.2 kg)   Ideal Body Weight:  50 kg  BMI:  Body mass index is 42.51 kg/m.  Estimated Nutritional Needs:  Kcal:  1400-1600 kcals (13-15 kcal/kg bw) Protein:  60-70 g (1.2-1.4 g/kg ibw) Fluid:  1.4-1.6 Liters fluid  EDUCATION NEEDS:  No education needs identified at this time  Christophe LouisNathan Kearah Gayden RD, LDN, CNSC Clinical Nutrition Pager: [phone] 05/04/2016 3:17 PM

## 2016-05-05 ENCOUNTER — Other Ambulatory Visit (HOSPITAL_COMMUNITY): Payer: Self-pay

## 2016-05-05 ENCOUNTER — Encounter (HOSPITAL_COMMUNITY): Payer: Self-pay | Admitting: *Deleted

## 2016-05-05 ENCOUNTER — Observation Stay (HOSPITAL_COMMUNITY): Payer: Medicare Other

## 2016-05-05 DIAGNOSIS — G4733 Obstructive sleep apnea (adult) (pediatric): Secondary | ICD-10-CM | POA: Diagnosis not present

## 2016-05-05 DIAGNOSIS — N179 Acute kidney failure, unspecified: Secondary | ICD-10-CM | POA: Diagnosis not present

## 2016-05-05 DIAGNOSIS — R079 Chest pain, unspecified: Secondary | ICD-10-CM

## 2016-05-05 DIAGNOSIS — D696 Thrombocytopenia, unspecified: Secondary | ICD-10-CM | POA: Diagnosis not present

## 2016-05-05 DIAGNOSIS — I480 Paroxysmal atrial fibrillation: Secondary | ICD-10-CM | POA: Diagnosis not present

## 2016-05-05 DIAGNOSIS — R112 Nausea with vomiting, unspecified: Secondary | ICD-10-CM | POA: Diagnosis not present

## 2016-05-05 DIAGNOSIS — I2510 Atherosclerotic heart disease of native coronary artery without angina pectoris: Secondary | ICD-10-CM | POA: Diagnosis not present

## 2016-05-05 DIAGNOSIS — I10 Essential (primary) hypertension: Secondary | ICD-10-CM | POA: Diagnosis not present

## 2016-05-05 DIAGNOSIS — N281 Cyst of kidney, acquired: Secondary | ICD-10-CM | POA: Diagnosis not present

## 2016-05-05 DIAGNOSIS — K219 Gastro-esophageal reflux disease without esophagitis: Secondary | ICD-10-CM | POA: Diagnosis not present

## 2016-05-05 DIAGNOSIS — K429 Umbilical hernia without obstruction or gangrene: Secondary | ICD-10-CM | POA: Diagnosis not present

## 2016-05-05 DIAGNOSIS — E1122 Type 2 diabetes mellitus with diabetic chronic kidney disease: Secondary | ICD-10-CM | POA: Diagnosis not present

## 2016-05-05 DIAGNOSIS — I130 Hypertensive heart and chronic kidney disease with heart failure and stage 1 through stage 4 chronic kidney disease, or unspecified chronic kidney disease: Secondary | ICD-10-CM | POA: Diagnosis not present

## 2016-05-05 DIAGNOSIS — J9601 Acute respiratory failure with hypoxia: Secondary | ICD-10-CM | POA: Diagnosis not present

## 2016-05-05 DIAGNOSIS — R0789 Other chest pain: Secondary | ICD-10-CM | POA: Diagnosis not present

## 2016-05-05 DIAGNOSIS — R197 Diarrhea, unspecified: Secondary | ICD-10-CM | POA: Diagnosis not present

## 2016-05-05 DIAGNOSIS — R0602 Shortness of breath: Secondary | ICD-10-CM | POA: Diagnosis not present

## 2016-05-05 DIAGNOSIS — I5032 Chronic diastolic (congestive) heart failure: Secondary | ICD-10-CM | POA: Diagnosis not present

## 2016-05-05 DIAGNOSIS — I081 Rheumatic disorders of both mitral and tricuspid valves: Secondary | ICD-10-CM | POA: Diagnosis not present

## 2016-05-05 DIAGNOSIS — D631 Anemia in chronic kidney disease: Secondary | ICD-10-CM | POA: Diagnosis not present

## 2016-05-05 DIAGNOSIS — Z7901 Long term (current) use of anticoagulants: Secondary | ICD-10-CM | POA: Diagnosis not present

## 2016-05-05 DIAGNOSIS — Z951 Presence of aortocoronary bypass graft: Secondary | ICD-10-CM | POA: Diagnosis not present

## 2016-05-05 DIAGNOSIS — R296 Repeated falls: Secondary | ICD-10-CM | POA: Diagnosis not present

## 2016-05-05 DIAGNOSIS — I4510 Unspecified right bundle-branch block: Secondary | ICD-10-CM | POA: Diagnosis not present

## 2016-05-05 DIAGNOSIS — I481 Persistent atrial fibrillation: Secondary | ICD-10-CM | POA: Diagnosis not present

## 2016-05-05 DIAGNOSIS — E785 Hyperlipidemia, unspecified: Secondary | ICD-10-CM | POA: Diagnosis not present

## 2016-05-05 DIAGNOSIS — N183 Chronic kidney disease, stage 3 (moderate): Secondary | ICD-10-CM | POA: Diagnosis not present

## 2016-05-05 LAB — HEMOGLOBIN A1C
Hgb A1c MFr Bld: 5 % (ref 4.8–5.6)
MEAN PLASMA GLUCOSE: 97 mg/dL

## 2016-05-05 LAB — BASIC METABOLIC PANEL
ANION GAP: 6 (ref 5–15)
BUN: 12 mg/dL (ref 6–20)
CHLORIDE: 106 mmol/L (ref 101–111)
CO2: 24 mmol/L (ref 22–32)
Calcium: 8.6 mg/dL — ABNORMAL LOW (ref 8.9–10.3)
Creatinine, Ser: 1.37 mg/dL — ABNORMAL HIGH (ref 0.44–1.00)
GFR calc Af Amer: 42 mL/min — ABNORMAL LOW (ref >60)
GFR, EST NON AFRICAN AMERICAN: 36 mL/min — AB (ref >60)
GLUCOSE: 131 mg/dL — AB (ref 65–99)
POTASSIUM: 4.8 mmol/L (ref 3.5–5.1)
Sodium: 136 mmol/L (ref 135–145)

## 2016-05-05 LAB — ECHOCARDIOGRAM COMPLETE
HEIGHTINCHES: 62 in
Weight: 3803.2 oz

## 2016-05-05 LAB — GLUCOSE, CAPILLARY
GLUCOSE-CAPILLARY: 106 mg/dL — AB (ref 65–99)
Glucose-Capillary: 119 mg/dL — ABNORMAL HIGH (ref 65–99)
Glucose-Capillary: 117 mg/dL — ABNORMAL HIGH (ref 65–99)
Glucose-Capillary: 105 mg/dL — ABNORMAL HIGH (ref 65–99)

## 2016-05-05 LAB — URINE CULTURE

## 2016-05-05 LAB — D-DIMER, QUANTITATIVE (NOT AT ARMC): D DIMER QUANT: 0.89 ug{FEU}/mL — AB (ref 0.00–0.50)

## 2016-05-05 MED ORDER — IOPAMIDOL (ISOVUE-300) INJECTION 61%
INTRAVENOUS | Status: AC
  Filled 2016-05-05: qty 30

## 2016-05-05 MED ORDER — PERFLUTREN LIPID MICROSPHERE
1.0000 mL | INTRAVENOUS | Status: AC | PRN
  Administered 2016-05-05: 2 mL via INTRAVENOUS
  Filled 2016-05-05: qty 10

## 2016-05-05 MED ORDER — SODIUM CHLORIDE 0.9 % IV SOLN: INTRAVENOUS | Status: AC

## 2016-05-05 MED ORDER — IOPAMIDOL (ISOVUE-370) INJECTION 76%
INTRAVENOUS | Status: AC
  Administered 2016-05-05: 80 mL
  Filled 2016-05-05: qty 100

## 2016-05-05 MED ORDER — OXYCODONE HCL 5 MG PO TABS
2.5000 mg | ORAL_TABLET | Freq: Three times a day (TID) | ORAL | Status: DC | PRN
  Administered 2016-05-05: 2.5 mg via ORAL
  Filled 2016-05-05: qty 1

## 2016-05-05 NOTE — Evaluation (Signed)
Physical Therapy Evaluation Patient Details Name: Lisa [name] MRN: [id number] DOB: [birth year] Today's Date: 05/05/2016   History of Present Illness  Pt is a 75 y.o female who presents with chest pain, SOB, decreased PO intake, and diarrhea. PMH includes CAD, HTN, chronic diastolic CHF.  Clinical Impression  Pt admitted with above diagnosis. Pt currently with functional limitations due to the deficits listed below (see PT Problem List). At the time of PT eval pt very SOB with notable wheezing. Pt had difficulty answering questions at times once sitting EOB. When sitting, O2 sats dropped to 77% on RA. RN was notified and supplemental O2 was initiated. Pt at 95% O2 sat at end of session. Pt was educated on pursed-lip breathing. Pt will benefit from skilled PT to increase their independence and safety with mobility to allow discharge to the venue listed below. As pt is alone often and only has assistance a couple hours a day, recommending SNF at d/c to improve mobility and tolerance for functional activity prior to return home. Discussed with pt and family who were in agreement.     Follow Up Recommendations SNF;Supervision/Assistance - 24 hour    Equipment Recommendations  None recommended by PT    Recommendations for Other Services       Precautions / Restrictions Precautions Precautions: Fall Precaution Comments: Very SOB upon eval Restrictions Weight Bearing Restrictions: No      Mobility  Bed Mobility Overal bed mobility: Needs Assistance;+2 for physical assistance Bed Mobility: Rolling;Sidelying to Sit;Sit to Supine Rolling: Mod assist Sidelying to sit: Min assist;+2 for physical assistance   Sit to supine: Max assist;+2 for physical assistance   General bed mobility comments: Pt required cues and assist for all aspects of bed mobility. Pt was able to achieve EOB however became very SOB and O2 sats decreased to 77% on RA. Pt was returned to supine with +2 assist.    Transfers                 General transfer comment: Unable due to O2 status  Ambulation/Gait                Stairs            Wheelchair Mobility    Modified Rankin (Stroke Patients Only)       Balance Overall balance assessment: Needs assistance Sitting-balance support: Feet supported;Bilateral upper extremity supported Sitting balance-Leahy Scale: Poor Sitting balance - Comments: Even with BUE support, pt had difficulty maintaining upright posture.  Postural control: Posterior lean                                   Pertinent Vitals/Pain Pain Assessment: Faces Faces Pain Scale: Hurts even more Pain Location: Back Pain Descriptors / Indicators: Discomfort Pain Intervention(s): Limited activity within patient's tolerance;Monitored during session;Repositioned    Home Living Family/patient expects to be discharged to:: Private residence Living Arrangements: Alone Available Help at Discharge: Family;Available PRN/intermittently;Personal care attendant Type of Home: Apartment Home Access: Stairs to enter Entrance Stairs-Rails: Right;Left;Can reach both Entrance Stairs-Number of Steps: 5 Home Layout: One level Home Equipment: Walker - 4 wheels;Shower seat      Prior Function Level of Independence: Needs assistance   Gait / Transfers Assistance Needed: Uses rollator for ambulation in house and uses wheelchair out in the community  ADL's / Homemaking Assistance Needed: aide comes in 2 hours a day and assists with bathing and  dressing        Hand Dominance   Dominant Hand: Right    Extremity/Trunk Assessment   Upper Extremity Assessment: Defer to OT evaluation           Lower Extremity Assessment: Generalized weakness      Cervical / Trunk Assessment: Other exceptions  Communication   Communication: No difficulties  Cognition Arousal/Alertness: Lethargic Behavior During Therapy: Flat affect Overall Cognitive Status:  Difficult to assess                      General Comments General comments (skin integrity, edema, etc.): Family present towards end of session and reports that she appears confused and "not right". RN notified and present at end of session.     Exercises     Assessment/Plan    PT Assessment Patient needs continued PT services  PT Problem List Decreased range of motion;Decreased strength;Decreased activity tolerance;Decreased balance          PT Treatment Interventions DME instruction;Gait training;Stair training;Functional mobility training;Therapeutic activities;Therapeutic exercise;Neuromuscular re-education;Patient/family education    PT Goals (Current goals can be found in the Care Plan section)  Acute Rehab PT Goals Patient Stated Goal: Pt did not state goals during session.  PT Goal Formulation: With patient/family Time For Goal Achievement: 05/19/16 Potential to Achieve Goals: Good    Frequency Min 2X/week   Barriers to discharge Decreased caregiver support Pt is alone at times with aide coming in a couple hours a day to assist with ADL's    Co-evaluation               End of Session Equipment Utilized During Treatment: Oxygen Activity Tolerance: Treatment limited secondary to medical complications (Comment) (oxygen desaturation) Patient left: with call bell/phone within reach;in bed;with family/visitor present;with nursing/sitter in room Nurse Communication: Mobility status;Other (comment) (O2 status)    Functional Assessment Tool Used: Clinical judgement Functional Limitation: Mobility: Walking and moving around Mobility: Walking and Moving Around Current Status [phone](G8978): At least 60 percent but less than 80 percent impaired, limited or restricted Mobility: Walking and Moving Around Goal Status [phone](G8979): At least 40 percent but less than 60 percent impaired, limited or restricted    Time: [id number] PT Time Calculation (min) (ACUTE ONLY): 21  min   Charges:   PT Evaluation $PT Eval Moderate Complexity: 1 Procedure     PT G Codes:   PT G-Codes **NOT FOR INPATIENT CLASS** Functional Assessment Tool Used: Clinical judgement Functional Limitation: Mobility: Walking and moving around Mobility: Walking and Moving Around Current Status ([id number]): At least 60 percent but less than 80 percent impaired, limited or restricted Mobility: Walking and Moving Around Goal Status [phone](G8979): At least 40 percent but less than 60 percent impaired, limited or restricted    Conni SlipperKirkman, [place] 05/05/2016, 2:25 PM  Conni SlipperLaura Makenzye Troutman, PT, DPT Acute Rehabilitation Services Pager: [phone]

## 2016-05-05 NOTE — Progress Notes (Signed)
Addendum  Reviewed CTA chest and non contrasted CT Abd: No PE & No acute abnormalities.  Discussed at length with patient's daughter Ms. Janine Limboheryl Barnes. According to her, patient does not usually confused but was confused when she came by to the hospital to feed her today. Appetite has also been low prior to admission. She is not on any opioids PTA. Updated care and answered questions.  DC opioids  Merlyn Bollen, MD, FACP, FHM. [facility] Pager [phone]  If 7PM-7AM, please contact night-coverage [email or web address] Password [email or web address] 05/05/2016, 6:05 PM

## 2016-05-05 NOTE — Progress Notes (Signed)
CSW received c/s for pt having difficulty getting meds.  Please order Care Management c/s as they provide medication assistance.  No other social work needs noted.  CSW signing off, please re-consult as necessary.  Pollyann SavoyJody Welles Walthall, LCSW Weekend Coverage [phone]

## 2016-05-05 NOTE — Progress Notes (Addendum)
PROGRESS NOTE  Lisa [name]  MVH:[id number] DOB: [birth year]  DOA: 05/03/2016 PCP: [name],[name], MD   Brief Narrative:  75 year old female with PMH of CAD, HTN, HLD, chronic diastolic CHF, osteoarthritis, persistent A. fib, OSA, DM not on insulin presented from Dr. Turner/cardiologist office to ED on 05/03/16 for evaluation of chest pain. Poor historian limited history taking. Chest pain described as worse with chest wall movements and deep inspiration. Associated couple days history of nausea, vomiting and diarrhea. Cardiologist consulting.   Assessment & Plan:   Principal Problem:   Chest pain, atypical Active Problems:   GERD (gastroesophageal reflux disease)   Hypertension   Diabetes mellitus (HCC)   Morbid obesity (HCC)   CAD (coronary artery disease)   Chronic diastolic CHF (congestive heart failure) (HCC)   Persistent atrial fibrillation (HCC)   Nausea, vomiting and diarrhea   1. Chest pain: Seemed very atypical suspicious for musculoskeletal etiology and was worse with chest wall movements or deep inspiration and was reproducible to palpation. Does not even seem like GI in etiology. Troponin minimally elevated but flat trend. EKG in office nonischemic with chronic RBBB. 2-D echo results as below: Normal EF without wall motion abnormalities (may consider outpatient appears stress test). Patient poor historian. Cardiology follow-up appreciated. Discussed with Dr. Mayford Knifeurner> d-dimer positive and hence getting CTA chest to rule out PE. 2. Nausea, vomiting and intermittent diarrhea: Unclear etiology. Denies sick contacts. Lipase normal. CT abdomen and pelvis pending. LFTs unremarkable. Symptoms resolved and verified with RN. 3. Hypokalemia: Replaced. Magnesium normal. 4. Type II DM: Continue SSI. Check A1c. 5. CAD status post remote CABG: Management as above. 6. Chronic diastolic CHF: Appears compensated. Lasix currently held secondary to poor oral intake and GI  losses. 7. Hyperlipidemia: Simvastatin changed to Lipitor since she is on amlodipine. 8. Essential hypertension: Controlled on beta blockers and amlodipine. 9. OSA: Has not been using her CPAP at home because she needs a new mask. 10. Persistent A. fib: Maintaining sinus rhythm. Remains on Toprol and Apixaban 11. Recurrent falls. PT and OT evaluation. 12. Anemia: Stable. Follow CBCs. Mild thrombocytopenia. 13. Stage III chronic kidney disease: Creatinine creeping up slightly. Follow BMP.   DVT prophylaxis: Apixaban Code Status: Full Family Communication: None at bedside Disposition Plan: To be determined   Consultants:   Cardiology  Procedures:   2-D echo 05/05/16: Study Conclusions  - Left ventricle: The cavity size was normal. Wall thickness was   increased in a pattern of moderate LVH. Systolic function was   normal. The estimated ejection fraction was in the range of 50%   to 55%. Wall motion was normal; there were no regional wall   motion abnormalities. Doppler parameters are consistent with   abnormal left ventricular relaxation (grade 1 diastolic   dysfunction). - Mitral valve: Mildly calcified annulus. - Left atrium: The atrium was moderately dilated. - Tricuspid valve: There was mild-moderate regurgitation. - Pulmonary arteries: Systolic pressure was moderately increased.   PA peak pressure: 53 mm Hg (S).  Antimicrobials:   None    Subjective: No further nausea, vomiting or diarrhea reported. To me complaints of similar chest pain is yesterday but denies neck pain. Indicates that her breathing/dyspnea is at baseline without worsening.  Objective:  Vitals:   05/04/16 2040 05/05/16 0405 05/05/16 0758 05/05/16 1014  BP: (!) 147/59 (!) 132/55    Pulse: 78 76 75 77  Resp: 17 (!) 22 (!) 21 20  Temp: 99.1 F (37.3 C) 98.7 F (37.1 C)  TempSrc: Oral Oral    SpO2: 90%  94% 95%  Weight:  107.8 kg (237 lb 11.2 oz)    Height:        Intake/Output Summary  (Last 24 hours) at 05/05/16 1434 Last data filed at 05/05/16 0830  Gross per 24 hour  Intake              360 ml  Output                0 ml  Net              360 ml   Filed Weights   05/03/16 1708 05/04/16 0418 05/05/16 0405  Weight: 104.8 kg (231 lb 0.7 oz) 105.4 kg (232 lb 6.4 oz) 107.8 kg (237 lb 11.2 oz)    Examination:  General exam: Pleasant elderly female lying comfortably propped up in bed. Respiratory system: Distant breath sounds but seems clear to auscultation. Respiratory effort normal. Reproducible chest wall tenderness-less prominent than yesterday. Cardiovascular system: S1 & S2 heard, RRR. No JVD, murmurs, rubs, gallops or clicks. No pedal edema. Telemetry: Sinus rhythm with BBB morphology. Gastrointestinal system: Abdomen is nondistended, soft and nontender. No organomegaly or masses felt. Normal bowel sounds heard. Central nervous system: Alert and oriented. No focal neurological deficits. Extremities: Symmetric 5 x 5 power. Skin: No rashes, lesions or ulcers Psychiatry: Judgement and insight appear impaired & affect appropriate.     Data Reviewed: I have personally reviewed following labs and imaging studies  CBC:  Recent Labs Lab 05/03/16 1859 05/04/16 0219  WBC 6.2 6.4  NEUTROABS 3.8 3.9  HGB 10.2* 9.6*  HCT 30.6* 28.9*  MCV 95.3 95.1  PLT 152 146*   Basic Metabolic Panel:  Recent Labs Lab 05/03/16 1859 05/03/16 2215 05/04/16 0219 05/04/16 0804 05/05/16 0344  NA 138 140  --  137 136  K 2.9* 3.0*  --  3.6 4.8  CL 103 106  --  104 106  CO2 24 26  --  23 24  GLUCOSE 76 89  --  79 131*  BUN 8 7  --  8 12  CREATININE 1.27* 1.19*  --  1.22* 1.37*  CALCIUM 8.6* 8.4*  --  8.1* 8.6*  MG 1.4*  --  2.0  --   --   PHOS 3.0  --  3.3  --   --    GFR: Estimated Creatinine Clearance: 39.7 mL/min (by C-G formula based on SCr of 1.37 mg/dL (H)). Liver Function Tests:  Recent Labs Lab 05/03/16 1859 05/03/16 2215  AST 22 19  ALT 8* 8*  ALKPHOS  43 41  BILITOT 1.3* 1.0  PROT 6.2* 5.6*  ALBUMIN 2.8* 2.5*    Recent Labs Lab 05/03/16 2215  LIPASE 21   No results for input(s): AMMONIA in the last 168 hours. Coagulation Profile:  Recent Labs Lab 05/03/16 1859  INR 1.34   Cardiac Enzymes:  Recent Labs Lab 05/03/16 1859 05/03/16 2215 05/04/16 0219  TROPONINI 0.04* 0.03* 0.04*   BNP (last 3 results) No results for input(s): PROBNP in the last 8760 hours. HbA1C: No results for input(s): HGBA1C in the last 72 hours. CBG:  Recent Labs Lab 05/04/16 1143 05/04/16 1610 05/04/16 2038 05/05/16 0739 05/05/16 1108  GLUCAP 81 119* 164* 119* 106*   Lipid Profile:  Recent Labs  05/04/16 0219  CHOL 133  HDL 36*  LDLCALC 76  TRIG [id number]  CHOLHDL 3.7   Thyroid Function Tests: No results for input(s): TSH,  T4TOTAL, FREET4, T3FREE, THYROIDAB in the last 72 hours. Anemia Panel: No results for input(s): VITAMINB12, FOLATE, FERRITIN, TIBC, IRON, RETICCTPCT in the last 72 hours.  Sepsis Labs: No results for input(s): PROCALCITON, LATICACIDVEN in the last 168 hours.  Recent Results (from the past 240 hour(s))  Urine culture     Status: Abnormal   Collection Time: 05/03/16  7:25 PM  Result Value Ref Range Status   Specimen Description URINE, RANDOM  Final   Special Requests NONE  Final   Culture MULTIPLE SPECIES PRESENT, SUGGEST RECOLLECTION (A)  Final   Report Status 05/05/2016 FINAL  Final         Radiology Studies: Dg Chest Port 1 View  Result Date: 05/04/2016 CLINICAL DATA:  Chest pain, shortness of breath x2 months EXAM: PORTABLE CHEST 1 VIEW COMPARISON:  10/01/2009 FINDINGS: Low lung volumes. Platelike scarring/ atelectasis in the right lower lung. No pleural effusion or pneumothorax. Cardiomegaly.  Postsurgical changes related to prior CABG. IMPRESSION: Low lung volumes with platelike scarring/ atelectasis in the right lower lung. These results will be called to the ordering clinician or representative by  the Radiologist Assistant, and communication documented in the PACS or zVision Dashboard. Electronically Signed   By: Charline Bills M.D.   On: 05/04/2016 18:36        Scheduled Meds: . amLODipine  5 mg Oral Daily  . apixaban  5 mg Oral BID  . aspirin EC  81 mg Oral Daily  . atorvastatin  10 mg Oral q1800  . budesonide  0.25 mg Nebulization BID  . buPROPion  150 mg Oral Daily  . feeding supplement (ENSURE ENLIVE)  237 mL Oral BID BM  . gabapentin  600 mg Oral TID  . insulin aspart  0-9 Units Subcutaneous TID WC  . iopamidol      . metoprolol succinate  25 mg Oral Daily  . pantoprazole  40 mg Oral Daily  . sodium chloride flush  3 mL Intravenous Q12H  . [START ON 05/06/2016] Vitamin D (Ergocalciferol)  50,000 Units Oral Once per day on Mon Thu   Continuous Infusions: . sodium chloride 50 mL/hr at 05/04/16 1807     LOS: 1 day     [facility], MD [facility] Pager [phone]  If 7PM-7AM, please contact night-coverage [email or web address] Password [facility] 05/05/2016, 2:34 PM

## 2016-05-05 NOTE — Progress Notes (Addendum)
Patient not ready for CPAP at this time. RT will check back at a later time. °

## 2016-05-05 NOTE — Progress Notes (Signed)
  Echocardiogram 2D Echocardiogram with Definity has been performed.  Leta JunglingCooper, Jowana Thumma M 05/05/2016, 11:55 AM

## 2016-05-05 NOTE — Discharge Instructions (Addendum)
Information on my medicine - ELIQUIS (apixaban)  This medication education was reviewed with me or my healthcare representative as part of my discharge preparation.  Why was Eliquis prescribed for you? Eliquis was prescribed for you to reduce the risk of a blood clot forming that can cause a stroke if you have a medical condition called atrial fibrillation (a type of irregular heartbeat).  What do You need to know about Eliquis ? Take your Eliquis TWICE DAILY - one tablet in the morning and one tablet in the evening with or without food. If you have difficulty swallowing the tablet whole please discuss with your pharmacist how to take the medication safely.  Take Eliquis exactly as prescribed by your doctor and DO NOT stop taking Eliquis without talking to the doctor who prescribed the medication.  Stopping may increase your risk of developing a stroke.  Refill your prescription before you run out.  After discharge, you should have regular check-up appointments with your healthcare provider that is prescribing your Eliquis.  In the future your dose may need to be changed if your kidney function or weight changes by a significant amount or as you get older.  What do you do if you miss a dose? If you miss a dose, take it as soon as you remember on the same day and resume taking twice daily.  Do not take more than one dose of ELIQUIS at the same time to make up a missed dose.  Important Safety Information A possible side effect of Eliquis is bleeding. You should call your healthcare provider right away if you experience any of the following: ? Bleeding from an injury or your nose that does not stop. ? Unusual colored urine (red or dark brown) or unusual colored stools (red or black). ? Unusual bruising for unknown reasons. ? A serious fall or if you hit your head (even if there is no bleeding).  Some medicines may interact with Eliquis and might increase your risk of bleeding or  clotting while on Eliquis. To help avoid this, consult your healthcare provider or pharmacist prior to using any new prescription or non-prescription medications, including herbals, vitamins, non-steroidal anti-inflammatory drugs (NSAIDs) and supplements.  This website has more information on Eliquis (apixaban): http://www.eliquis.com/eliquis/home    Nonspecific Chest Pain  Chest pain can be caused by many different conditions. There is always a chance that your pain could be related to something serious, such as a heart attack or a blood clot in your lungs. Chest pain can also be caused by conditions that are not life-threatening. If you have chest pain, it is very important to follow up with your health care provider. CAUSES  Chest pain can be caused by:  Heartburn.  Pneumonia or bronchitis.  Anxiety or stress.  Inflammation around your heart (pericarditis) or lung (pleuritis or pleurisy).  A blood clot in your lung.  A collapsed lung (pneumothorax). It can develop suddenly on its own (spontaneous pneumothorax) or from trauma to the chest.  Shingles infection (varicella-zoster virus).  Heart attack.  Damage to the bones, muscles, and cartilage that make up your chest wall. This can include:  Bruised bones due to injury.  Strained muscles or cartilage due to frequent or repeated coughing or overwork.  Fracture to one or more ribs.  Sore cartilage due to inflammation (costochondritis). RISK FACTORS  Risk factors for chest pain may include:  Activities that increase your risk for trauma or injury to your chest.  Respiratory infections or conditions  that cause frequent coughing.  Medical conditions or overeating that can cause heartburn.  Heart disease or family history of heart disease.  Conditions or health behaviors that increase your risk of developing a blood clot.  Having had chicken pox (varicella zoster). SIGNS AND SYMPTOMS Chest pain can feel  like:  Burning or tingling on the surface of your chest or deep in your chest.  Crushing, pressure, aching, or squeezing pain.  Dull or sharp pain that is worse when you move, cough, or take a deep breath.  Pain that is also felt in your back, neck, shoulder, or arm, or pain that spreads to any of these areas. Your chest pain may come and go, or it may stay constant. DIAGNOSIS Lab tests or other studies may be needed to find the cause of your pain. Your health care provider may have you take a test called an ambulatory ECG (electrocardiogram). An ECG records your heartbeat patterns at the time the test is performed. You may also have other tests, such as:  Transthoracic echocardiogram (TTE). During echocardiography, sound waves are used to create a picture of all of the heart structures and to look at how blood flows through your heart.  Transesophageal echocardiogram (TEE).This is a more advanced imaging test that obtains images from inside your body. It allows your health care provider to see your heart in finer detail.  Cardiac monitoring. This allows your health care provider to monitor your heart rate and rhythm in real time.  Holter monitor. This is a portable device that records your heartbeat and can help to diagnose abnormal heartbeats. It allows your health care provider to track your heart activity for several days, if needed.  Stress tests. These can be done through exercise or by taking medicine that makes your heart beat more quickly.  Blood tests.  Imaging tests. TREATMENT  Your treatment depends on what is causing your chest pain. Treatment may include:  Medicines. These may include:  Acid blockers for heartburn.  Anti-inflammatory medicine.  Pain medicine for inflammatory conditions.  Antibiotic medicine, if an infection is present.  Medicines to dissolve blood clots.  Medicines to treat coronary artery disease.  Supportive care for conditions that do not  require medicines. This may include:  Resting.  Applying heat or cold packs to injured areas.  Limiting activities until pain decreases. HOME CARE INSTRUCTIONS  If you were prescribed an antibiotic medicine, finish it all even if you start to feel better.  Avoid any activities that bring on chest pain.  Do not use any tobacco products, including cigarettes, chewing tobacco, or electronic cigarettes. If you need help quitting, ask your health care provider.  Do not drink alcohol.  Take medicines only as directed by your health care provider.  Keep all follow-up visits as directed by your health care provider. This is important. This includes any further testing if your chest pain does not go away.  If heartburn is the cause for your chest pain, you may be told to keep your head raised (elevated) while sleeping. This reduces the chance that acid will go from your stomach into your esophagus.  Make lifestyle changes as directed by your health care provider. These may include:  Getting regular exercise. Ask your health care provider to suggest some activities that are safe for you.  Eating a heart-healthy diet. A registered dietitian can help you to learn healthy eating options.  Maintaining a healthy weight.  Managing diabetes, if necessary.  Reducing stress. SEEK MEDICAL  CARE IF:  Your chest pain does not go away after treatment.  You have a rash with blisters on your chest.  You have a fever. SEEK IMMEDIATE MEDICAL CARE IF:   Your chest pain is worse.  You have an increasing cough, or you cough up blood.  You have severe abdominal pain.  You have severe weakness.  You faint.  You have chills.  You have sudden, unexplained chest discomfort.  You have sudden, unexplained discomfort in your arms, back, neck, or jaw.  You have shortness of breath at any time.  You suddenly start to sweat, or your skin gets clammy.  You feel nauseous or you vomit.  You  suddenly feel light-headed or dizzy.  Your heart begins to beat quickly, or it feels like it is skipping beats. These symptoms may represent a serious problem that is an emergency. Do not wait to see if the symptoms will go away. Get medical help right away. Call your local emergency services (911 in the [place] Do not drive yourself to the hospital.   This information is not intended to replace advice given to you by your health care provider. Make sure you discuss any questions you have with your health care provider.   Document Released: 04/03/2005 Document Revised: 07/15/2014 Document Reviewed: 01/28/2014 Elsevier Interactive Patient Education [employer]

## 2016-05-05 NOTE — Progress Notes (Addendum)
Patient Name: Lisa SchneidersGrace A [name] Date of Encounter: 05/05/2016  Primary Cardiologist: Dr. Nicole Cellaraci Turner  Hospital Problem List     Principal Problem:   Chest pain, atypical Active Problems:   GERD (gastroesophageal reflux disease)   Hypertension   Diabetes mellitus (HCC)   Morbid obesity (HCC)   CAD (coronary artery disease)   Chronic diastolic CHF (congestive heart failure) (HCC)   Persistent atrial fibrillation (HCC)   Nausea, vomiting and diarrhea     Subjective   Still complains of sharp pain in her chest with inspiration and movement as well as in her neck.  Also complains of wheezing  Inpatient Medications    Scheduled Meds: . amLODipine  5 mg Oral Daily  . apixaban  5 mg Oral BID  . aspirin EC  81 mg Oral Daily  . atorvastatin  10 mg Oral q1800  . budesonide  0.25 mg Nebulization BID  . buPROPion  150 mg Oral Daily  . feeding supplement (ENSURE ENLIVE)  237 mL Oral BID BM  . gabapentin  600 mg Oral TID  . insulin aspart  0-9 Units Subcutaneous TID WC  . metoprolol succinate  25 mg Oral Daily  . pantoprazole  40 mg Oral Daily  . sodium chloride flush  3 mL Intravenous Q12H  . [START ON 05/06/2016] Vitamin D (Ergocalciferol)  50,000 Units Oral Once per day on Mon Thu   Continuous Infusions: . sodium chloride 50 mL/hr at 05/04/16 1807   PRN Meds: acetaminophen **OR** acetaminophen, gi cocktail, ipratropium-albuterol, ondansetron (ZOFRAN) IV, oxyCODONE   Vital Signs    Vitals:   05/04/16 2040 05/05/16 0405 05/05/16 0758 05/05/16 1014  BP: (!) 147/59 (!) 132/55    Pulse: 78 76 75 77  Resp: 17 (!) 22 (!) 21 20  Temp: 99.1 F (37.3 C) 98.7 F (37.1 C)    TempSrc: Oral Oral    SpO2: 90%  94% 95%  Weight:  237 lb 11.2 oz (107.8 kg)    Height:        Intake/Output Summary (Last 24 hours) at 05/05/16 1018 Last data filed at 05/05/16 0830  Gross per 24 hour  Intake              360 ml  Output                0 ml  Net              360 ml   Filed  Weights   05/03/16 1708 05/04/16 0418 05/05/16 0405  Weight: 231 lb 0.7 oz (104.8 kg) 232 lb 6.4 oz (105.4 kg) 237 lb 11.2 oz (107.8 kg)    Physical Exam    GEN: Well nourished, well developed, in no acute distress.  HEENT: Grossly normal.  Neck: Supple, no JVD, carotid bruits, or masses. Cardiac: RRR, no murmurs, rubs, or gallops. No clubbing, cyanosis, edema.  Radials/DP/PT 2+ and equal bilaterally.  Respiratory: decreased BS with wheezing GI: Soft, nontender, nondistended, BS + x 4. MS: no deformity or atrophy. Skin: warm and dry, no rash. Neuro:  Strength and sensation are intact. Psych: AAOx3.  Normal affect.  Labs    CBC  Recent Labs  05/03/16 1859 05/04/16 0219  WBC 6.2 6.4  NEUTROABS 3.8 3.9  HGB 10.2* 9.6*  HCT 30.6* 28.9*  MCV 95.3 95.1  PLT 152 146*   Basic Metabolic Panel  Recent Labs  05/03/16 1859  05/04/16 0219 05/04/16 0804 05/05/16 0344  NA 138  < >  --  137 136  K 2.9*  < >  --  3.6 4.8  CL 103  < >  --  104 106  CO2 24  < >  --  23 24  GLUCOSE 76  < >  --  79 131*  BUN 8  < >  --  8 12  CREATININE 1.27*  < >  --  1.22* 1.37*  CALCIUM 8.6*  < >  --  8.1* 8.6*  MG 1.4*  --  2.0  --   --   PHOS 3.0  --  3.3  --   --   < > = values in this interval not displayed. Liver Function Tests  Recent Labs  05/03/16 1859 05/03/16 2215  AST 22 19  ALT 8* 8*  ALKPHOS 43 41  BILITOT 1.3* 1.0  PROT 6.2* 5.6*  ALBUMIN 2.8* 2.5*    Recent Labs  05/03/16 2215  LIPASE 21   Cardiac Enzymes  Recent Labs  05/03/16 1859 05/03/16 2215 05/04/16 0219  TROPONINI 0.04* 0.03* 0.04*   BNP Invalid input(s): POCBNP D-Dimer No results for input(s): DDIMER in the last 72 hours. Hemoglobin A1C No results for input(s): HGBA1C in the last 72 hours. Fasting Lipid Panel  Recent Labs  05/04/16 0219  CHOL 133  HDL 36*  LDLCALC 76  TRIG 161106  CHOLHDL 3.7   Thyroid Function Tests No results for input(s): TSH, T4TOTAL, T3FREE, THYROIDAB in the last  72 hours.  Invalid input(s): FREET3  Telemetry     - Personally Reviewed  ECG     - Personally Reviewed  Radiology    Dg Chest Port 1 View  Result Date: 05/04/2016 CLINICAL DATA:  Chest pain, shortness of breath x2 months EXAM: PORTABLE CHEST 1 VIEW COMPARISON:  10/01/2009 FINDINGS: Low lung volumes. Platelike scarring/ atelectasis in the right lower lung. No pleural effusion or pneumothorax. Cardiomegaly.  Postsurgical changes related to prior CABG. IMPRESSION: Low lung volumes with platelike scarring/ atelectasis in the right lower lung. These results will be called to the ordering clinician or representative by the Radiologist Assistant, and communication documented in the PACS or zVision Dashboard. Electronically Signed   By: Charline BillsSriyesh  Krishnan M.D.   On: 05/04/2016 18:36    Cardiac Studies   none  Patient Profile     Lisa LoaderGrace A McKinneyis a 75 y.o.femalewith a history of CAD, HTN, dyslipidemia, diastolic dysfunction, chronic diastolic CHF who presented to the office yesterday with SOB, chest pain, N/V, decreased PO intake and diarrhea and was admitted for further evaluation.   Assessment & Plan    1.  Chest pain with  atypical features.  It is midsternal but sharp and occurs more with deep breathing and movement. Also has neck pain that is worse with movement.  She has been having worsening SOB recently.  Cxray pending.  With associated N/V ? Whether this could be diabetic gastroparesis.  Cardiac enzymes minimally elevated with flat trend.  EKG in office non ischemic with chronic RBBB.  Will get 2D echo to assess for wall motion abnormalities.  If normal then plan outpt nuclear stress test. Will check d-dimer to rule out PE. 2.  ASCAD with remote CABG. 3.  Chronic diastolic CHF - she does not appear volume overloaded on exam but is wheezing.  I will get a Cxray and check a BNP 4.  Hyperlipidemia - LDL goal < 70. Last LDL 76.  Change simvastatin to Lipitor 10mg  daily since she is  on amlodipine. 5.  HTN - BP controlled on current meds. Continue BB and amlodipine.  6.  OSA - she has not been using her CPAP at home because she needs a new mask.  7.  N/V with decreased PO intake for several days of ? Etiology.  LFTs are normal but albumin is very low at 2.5.  Lipase normal.  She is also anemic. Workup per [facility]. 8.  Persistent atrial fibrillation maintaining NSR.  Continue Apixaban and Toprol.  Signed, Armanda Magic, MD  05/05/2016, 10:18 AM

## 2016-05-06 ENCOUNTER — Other Ambulatory Visit: Payer: Self-pay | Admitting: Cardiology

## 2016-05-06 DIAGNOSIS — D696 Thrombocytopenia, unspecified: Secondary | ICD-10-CM | POA: Diagnosis not present

## 2016-05-06 DIAGNOSIS — R1013 Epigastric pain: Secondary | ICD-10-CM | POA: Diagnosis not present

## 2016-05-06 DIAGNOSIS — Z7901 Long term (current) use of anticoagulants: Secondary | ICD-10-CM | POA: Diagnosis not present

## 2016-05-06 DIAGNOSIS — E785 Hyperlipidemia, unspecified: Secondary | ICD-10-CM | POA: Diagnosis not present

## 2016-05-06 DIAGNOSIS — R197 Diarrhea, unspecified: Secondary | ICD-10-CM | POA: Diagnosis not present

## 2016-05-06 DIAGNOSIS — D631 Anemia in chronic kidney disease: Secondary | ICD-10-CM | POA: Diagnosis not present

## 2016-05-06 DIAGNOSIS — I481 Persistent atrial fibrillation: Secondary | ICD-10-CM | POA: Diagnosis not present

## 2016-05-06 DIAGNOSIS — R109 Unspecified abdominal pain: Secondary | ICD-10-CM | POA: Diagnosis present

## 2016-05-06 DIAGNOSIS — N183 Chronic kidney disease, stage 3 (moderate): Secondary | ICD-10-CM | POA: Diagnosis not present

## 2016-05-06 DIAGNOSIS — I130 Hypertensive heart and chronic kidney disease with heart failure and stage 1 through stage 4 chronic kidney disease, or unspecified chronic kidney disease: Secondary | ICD-10-CM | POA: Diagnosis not present

## 2016-05-06 DIAGNOSIS — I081 Rheumatic disorders of both mitral and tricuspid valves: Secondary | ICD-10-CM | POA: Diagnosis not present

## 2016-05-06 DIAGNOSIS — N179 Acute kidney failure, unspecified: Secondary | ICD-10-CM | POA: Diagnosis not present

## 2016-05-06 DIAGNOSIS — R112 Nausea with vomiting, unspecified: Secondary | ICD-10-CM | POA: Diagnosis not present

## 2016-05-06 DIAGNOSIS — Z951 Presence of aortocoronary bypass graft: Secondary | ICD-10-CM | POA: Diagnosis not present

## 2016-05-06 DIAGNOSIS — E1122 Type 2 diabetes mellitus with diabetic chronic kidney disease: Secondary | ICD-10-CM | POA: Diagnosis not present

## 2016-05-06 DIAGNOSIS — I4510 Unspecified right bundle-branch block: Secondary | ICD-10-CM | POA: Diagnosis not present

## 2016-05-06 DIAGNOSIS — I480 Paroxysmal atrial fibrillation: Secondary | ICD-10-CM | POA: Diagnosis present

## 2016-05-06 DIAGNOSIS — I5032 Chronic diastolic (congestive) heart failure: Secondary | ICD-10-CM | POA: Diagnosis not present

## 2016-05-06 DIAGNOSIS — I2510 Atherosclerotic heart disease of native coronary artery without angina pectoris: Secondary | ICD-10-CM

## 2016-05-06 DIAGNOSIS — R296 Repeated falls: Secondary | ICD-10-CM | POA: Diagnosis not present

## 2016-05-06 DIAGNOSIS — R0789 Other chest pain: Secondary | ICD-10-CM | POA: Diagnosis not present

## 2016-05-06 DIAGNOSIS — N281 Cyst of kidney, acquired: Secondary | ICD-10-CM | POA: Diagnosis not present

## 2016-05-06 DIAGNOSIS — J9601 Acute respiratory failure with hypoxia: Secondary | ICD-10-CM | POA: Diagnosis not present

## 2016-05-06 DIAGNOSIS — N170 Acute kidney failure with tubular necrosis: Secondary | ICD-10-CM | POA: Diagnosis not present

## 2016-05-06 DIAGNOSIS — E0800 Diabetes mellitus due to underlying condition with hyperosmolarity without nonketotic hyperglycemic-hyperosmolar coma (NKHHC): Secondary | ICD-10-CM | POA: Diagnosis not present

## 2016-05-06 DIAGNOSIS — G4733 Obstructive sleep apnea (adult) (pediatric): Secondary | ICD-10-CM | POA: Diagnosis not present

## 2016-05-06 DIAGNOSIS — R079 Chest pain, unspecified: Secondary | ICD-10-CM

## 2016-05-06 DIAGNOSIS — K219 Gastro-esophageal reflux disease without esophagitis: Secondary | ICD-10-CM | POA: Diagnosis not present

## 2016-05-06 LAB — GLUCOSE, CAPILLARY
GLUCOSE-CAPILLARY: 88 mg/dL (ref 65–99)
Glucose-Capillary: 93 mg/dL (ref 65–99)
Glucose-Capillary: 94 mg/dL (ref 65–99)
Glucose-Capillary: 117 mg/dL — ABNORMAL HIGH (ref 65–99)

## 2016-05-06 LAB — CBC
HCT: 28.6 % — ABNORMAL LOW (ref 36.0–46.0)
HEMOGLOBIN: 9.1 g/dL — AB (ref 12.0–15.0)
MCH: 31.3 pg (ref 26.0–34.0)
MCHC: 31.8 g/dL (ref 30.0–36.0)
MCV: 98.3 fL (ref 78.0–100.0)
PLATELETS: 164 10*3/uL (ref 150–400)
RBC: 2.91 MIL/uL — ABNORMAL LOW (ref 3.87–5.11)
RDW: 12.9 % (ref 11.5–15.5)
WBC: 8.8 10*3/uL (ref 4.0–10.5)

## 2016-05-06 LAB — BASIC METABOLIC PANEL
Anion gap: 5 (ref 5–15)
BUN: 17 mg/dL (ref 6–20)
CALCIUM: 8.4 mg/dL — AB (ref 8.9–10.3)
CHLORIDE: 104 mmol/L (ref 101–111)
CO2: 22 mmol/L (ref 22–32)
CREATININE: 1.76 mg/dL — AB (ref 0.44–1.00)
GFR, EST AFRICAN AMERICAN: 31 mL/min — AB (ref >60)
GFR, EST NON AFRICAN AMERICAN: 27 mL/min — AB (ref >60)
Glucose, Bld: 108 mg/dL — ABNORMAL HIGH (ref 65–99)
Potassium: 5.4 mmol/L — ABNORMAL HIGH (ref 3.5–5.1)
SODIUM: 131 mmol/L — AB (ref 135–145)

## 2016-05-06 MED ORDER — SODIUM CHLORIDE 0.9 % IV SOLN
INTRAVENOUS | Status: DC
  Administered 2016-05-06: 11:00:00 via INTRAVENOUS

## 2016-05-06 NOTE — Clinical Social Work Note (Signed)
Patient provided with [facility] Per patient report, patient will go by [facility].  [address] Mayton, [place] [phone]

## 2016-05-06 NOTE — Progress Notes (Signed)
PROGRESS NOTE  Lisa [name]  ZOX:[id number] DOB: [birth year]  DOA: 05/03/2016 PCP: Laurena Slimmer, MD   Brief Narrative:  75 year old female with PMH of CAD, HTN, HLD, chronic diastolic CHF, osteoarthritis, persistent A. fib, OSA, DM not on insulin presented from Dr. Turner/cardiologist office to ED on 05/03/16 for evaluation of chest pain. Poor historian limited history taking. Chest pain described as worse with chest wall movements and deep inspiration. Associated couple days history of nausea, vomiting and diarrhea. Cardiologist consulting.   Assessment & Plan:   Principal Problem:   Chest pain, atypical Active Problems:   GERD (gastroesophageal reflux disease)   Hypertension   Diabetes mellitus (HCC)   Morbid obesity (HCC)   CAD (coronary artery disease)   Chronic diastolic CHF (congestive heart failure) (HCC)   Persistent atrial fibrillation (HCC)   Nausea, vomiting and diarrhea   1. Chest pain: Seemed very atypical suspicious for musculoskeletal etiology and was worse with chest wall movements or deep inspiration and was reproducible to palpation. Does not even seem like GI in etiology. Troponin minimally elevated but flat trend. EKG in office nonischemic with chronic RBBB. 2-D echo results as below: Normal EF without wall motion abnormalities (may consider outpatient appears stress test). Patient poor historian. Cardiology follow-up appreciated. Discussed with Dr. Mayford Knife d-dimer positive and hence getting CTA chest to rule out PE.CTA chest and non contrasted CT Abd: No PE & No acute abnormalities. 2-D echo without wall motion abnormalities. Patient would benefit from outpatient nuclear study. 2. Nausea, vomiting and intermittent diarrhea: Unclear etiology. Denies sick contacts. Lipase normal. CT abdomen and pelvis negative. LFTs unremarkable. Symptoms resolved and verified with RN. 3. Hypokalemia: Replaced. Magnesium normal. 4. Type II DM: Continue SSI. Hemoglobin A1c  5.0 5. CAD status post remote CABG: Management as above. 6. Chronic diastolic CHF: Appears compensated. Lasix currently held secondary to poor oral intake and GI losses. Renal insufficiency 7. Hyperlipidemia: Simvastatin changed to Lipitor since she is on amlodipine. 8. Essential hypertension: Controlled on beta blockers and amlodipine. 9. OSA: Has not been using her CPAP at home because she needs a new mask. 10. Persistent A. fib: Maintaining sinus rhythm. Remains on Toprol and Apixaban 11. Recurrent falls. PT and OT evaluation. 12. Anemia: Stable. Follow CBCs. Mild thrombocytopenia. 13. 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 Creatinine has increased from 1.37> 1.76, follow BMP. Started patient on gentle IV fluids   DVT prophylaxis: Apixaban Code Status: Full Family Communication: None at bedside Disposition Plan: PT recommends SNF, anticipate discharge tomorrow renal function is improving   Consultants:   Cardiology  Procedures:   2-D echo 05/05/16: Study Conclusions  - Left ventricle: The cavity size was normal. Wall thickness was   increased in a pattern of moderate LVH. Systolic function was   normal. The estimated ejection fraction was in the range of 50%   to 55%. Wall motion was normal; there were no regional wall   motion abnormalities. Doppler parameters are consistent with   abnormal left ventricular relaxation (grade 1 diastolic   dysfunction). - Mitral valve: Mildly calcified annulus. - Left atrium: The atrium was moderately dilated. - Tricuspid valve: There was mild-moderate regurgitation. - Pulmonary arteries: Systolic pressure was moderately increased.   PA peak pressure: 53 mm Hg (S).  Antimicrobials:   None    Subjective:  Resting comfortably, denies chest pain or shortness of breath    Objective:  Vitals:   05/05/16 2051 05/06/16 0025 05/06/16 0306 05/06/16 8119  BP: (!) 133/94  (!) 134/49   Pulse: 60 63  (!) 49   Resp: (!) 22 20 20    Temp: 97.6 F (36.4 C)  97.5 F (36.4 C)   TempSrc: Oral  Axillary   SpO2: 96% 99% 95% 95%  Weight:      Height:       No intake or output data in the 24 hours ending 05/06/16 0947 Filed Weights   05/03/16 1708 05/04/16 0418 05/05/16 0405  Weight: 104.8 kg (231 lb 0.7 oz) 105.4 kg (232 lb 6.4 oz) 107.8 kg (237 lb 11.2 oz)    Examination:  General exam: Pleasant elderly female lying comfortably propped up in bed. Respiratory system: Distant breath sounds but seems clear to auscultation. Respiratory effort normal. Reproducible chest wall tenderness-less prominent than yesterday. Cardiovascular system: S1 & S2 heard, RRR. No JVD, murmurs, rubs, gallops or clicks. No pedal edema. Telemetry: Sinus rhythm with BBB morphology. Gastrointestinal system: Abdomen is nondistended, soft and nontender. No organomegaly or masses felt. Normal bowel sounds heard. Central nervous system: Alert and oriented. No focal neurological deficits. Extremities: Symmetric 5 x 5 power. Skin: No rashes, lesions or ulcers Psychiatry: Judgement and insight appear impaired & affect appropriate.     Data Reviewed: I have personally reviewed following labs and imaging studies  CBC:  Recent Labs Lab 05/03/16 1859 05/04/16 0219 05/06/16 0240  WBC 6.2 6.4 8.8  NEUTROABS 3.8 3.9  --   HGB 10.2* 9.6* 9.1*  HCT 30.6* 28.9* 28.6*  MCV 95.3 95.1 98.3  PLT 152 146* 164   Basic Metabolic Panel:  Recent Labs Lab 05/03/16 1859 05/03/16 2215 05/04/16 0219 05/04/16 0804 05/05/16 0344 05/06/16 0240  NA 138 140  --  137 136 131*  K 2.9* 3.0*  --  3.6 4.8 5.4*  CL 103 106  --  104 106 104  CO2 24 26  --  23 24 22   GLUCOSE 76 89  --  79 131* 108*  BUN 8 7  --  8 12 17   CREATININE 1.27* 1.19*  --  1.22* 1.37* 1.76*  CALCIUM 8.6* 8.4*  --  8.1* 8.6* 8.4*  MG 1.4*  --  2.0  --   --   --   PHOS 3.0  --  3.3  --   --   --    GFR: Estimated Creatinine Clearance: 30.9 mL/min (by  C-G formula based on SCr of 1.76 mg/dL (H)). Liver Function Tests:  Recent Labs Lab 05/03/16 1859 05/03/16 2215  AST 22 19  ALT 8* 8*  ALKPHOS 43 41  BILITOT 1.3* 1.0  PROT 6.2* 5.6*  ALBUMIN 2.8* 2.5*    Recent Labs Lab 05/03/16 2215  LIPASE 21   No results for input(s): AMMONIA in the last 168 hours. Coagulation Profile:  Recent Labs Lab 05/03/16 1859  INR 1.34   Cardiac Enzymes:  Recent Labs Lab 05/03/16 1859 05/03/16 2215 05/04/16 0219  TROPONINI 0.04* 0.03* 0.04*   BNP (last 3 results) No results for input(s): PROBNP in the last 8760 hours. HbA1C:  Recent Labs  05/03/16 1859  HGBA1C 5.0   CBG:  Recent Labs Lab 05/05/16 0739 05/05/16 1108 05/05/16 1758 05/05/16 2048 05/06/16 0556  GLUCAP 119* 106* 117* 105* 88   Lipid Profile:  Recent Labs  05/04/16 0219  CHOL 133  HDL 36*  LDLCALC 76  TRIG 161106  CHOLHDL 3.7   Thyroid Function Tests: No results for input(s): TSH, T4TOTAL, FREET4, T3FREE, THYROIDAB in the last  72 hours. Anemia Panel: No results for input(s): VITAMINB12, FOLATE, FERRITIN, TIBC, IRON, RETICCTPCT in the last 72 hours.  Sepsis Labs: No results for input(s): PROCALCITON, LATICACIDVEN in the last 168 hours.  Recent Results (from the past 240 hour(s))  Urine culture     Status: Abnormal   Collection Time: 05/03/16  7:25 PM  Result Value Ref Range Status   Specimen Description URINE, RANDOM  Final   Special Requests NONE  Final   Culture MULTIPLE SPECIES PRESENT, SUGGEST RECOLLECTION (A)  Final   Report Status 05/05/2016 FINAL  Final         Radiology Studies: Dg Chest 2 View  Result Date: 05/05/2016 CLINICAL DATA:  Shortness of breath. EXAM: CHEST  2 VIEW COMPARISON:  May 04, 2016 FINDINGS: Linear density in the right mid lung may represent scar or atelectasis versus fluid in the minor fissure. Stable cardiomegaly. No pneumothorax. No overt edema. No other acute abnormalities. IMPRESSION: No acute interval  change. Electronically Signed   By: Gerome Sam III M.D   On: 05/05/2016 18:19   Ct Angio Chest Pe W Or Wo Contrast  Result Date: 05/05/2016 CLINICAL DATA:  Lambert Mody mid chest pain. Elevated D-dimer. Nausea and vomiting. EXAM: CT ANGIOGRAPHY CHEST CT ABDOMEN AND PELVIS WITH CONTRAST TECHNIQUE: Multidetector CT imaging of the chest was performed using the standard protocol during bolus administration of intravenous contrast. Multiplanar CT image reconstructions and MIPs were obtained to evaluate the vascular anatomy. Multidetector CT imaging of the abdomen and pelvis was performed using the standard protocol during bolus administration of intravenous contrast. CONTRAST:  80 cc of Isovue 370 COMPARISON:  August 28, 2009 FINDINGS: CTA CHEST FINDINGS Cardiovascular: The thoracic aorta demonstrates atherosclerotic change but no aneurysm or dissection. No pleural or pericardial effusions. Cardiomegaly is identified. There are coronary artery calcifications. Respiratory motion, particularly in the bases, limits evaluation of the pulmonary arteries. Taking respiratory motion into account, no convincing evidence of pulmonary emboli identified. Mediastinum/Nodes: No adenopathy.  No effusions. Lungs/Pleura: The central airways are normal. No pneumothorax. Linear opacities in the lungs most consistent with scar or atelectasis. Respiratory motion limits evaluation, particularly of the lower lobes. No suspicious nodules or masses. No focal infiltrates. Musculoskeletal: Degenerative changes in the thoracic spine. No acute bony abnormalities. Previous sternotomy. Review of the MIP images confirms the above findings. CT ABDOMEN and PELVIS FINDINGS Hepatobiliary: Evaluation is limited due to respiratory motion. The patient is status post cholecystectomy. No suspicious focal masses identified. Limited views of the portal vein are unremarkable. Pancreas: Unremarkable. No pancreatic ductal dilatation or surrounding inflammatory  changes. Spleen: Evaluation is limited due to respiratory motion. No abnormalities. Adrenals/Urinary Tract: Multiple cysts are associated with the right kidney. No suspicious masses. An extra renal pelvis is associated with the left kidney. No hydronephrosis or suspicious mass. No ureterectasis or ureteral stones. The adrenal glands are normal in appearance. Stomach/Bowel: The distal esophagus and stomach are normal. The small bowel is unremarkable. A few scattered colonic diverticuli are seen without diverticulitis. Nondependent portions of colon are air-filled and mildly prominent but not dilated by CT criteria. No obstruction. No acute colonic abnormalities. The appendix is well seen on coronal images with no appendicitis. Vascular/Lymphatic: Atherosclerotic changes are seen in the non aneurysmal aorta. No dissection. Atherosclerotic changes extend into the iliac and femoral vessels. No adenopathy. Reproductive: The patient is status post hysterectomy. No suspicious adnexal masses. The ovaries appear to have been retained. Other: There is a fat containing umbilical hernia. Musculoskeletal: Postsurgical changes are  seen in the lower lumbar spine. Degenerative change are identified. Review of the MIP images confirms the above findings. IMPRESSION: 1. No pulmonary emboli.  Scattered atelectasis in the lungs. 2. No acute abnormalities in the abdomen to explain the patient's symptoms. 3. Atherosclerotic change in the aorta, iliac vessels, and femoral vessels. Electronically Signed   By: Gerome Sam III M.D   On: 05/05/2016 17:15   Ct Abdomen Pelvis W Contrast  Result Date: 05/05/2016 CLINICAL DATA:  Lambert Mody mid chest pain. Elevated D-dimer. Nausea and vomiting. EXAM: CT ANGIOGRAPHY CHEST CT ABDOMEN AND PELVIS WITH CONTRAST TECHNIQUE: Multidetector CT imaging of the chest was performed using the standard protocol during bolus administration of intravenous contrast. Multiplanar CT image reconstructions and MIPs  were obtained to evaluate the vascular anatomy. Multidetector CT imaging of the abdomen and pelvis was performed using the standard protocol during bolus administration of intravenous contrast. CONTRAST:  80 cc of Isovue 370 COMPARISON:  August 28, 2009 FINDINGS: CTA CHEST FINDINGS Cardiovascular: The thoracic aorta demonstrates atherosclerotic change but no aneurysm or dissection. No pleural or pericardial effusions. Cardiomegaly is identified. There are coronary artery calcifications. Respiratory motion, particularly in the bases, limits evaluation of the pulmonary arteries. Taking respiratory motion into account, no convincing evidence of pulmonary emboli identified. Mediastinum/Nodes: No adenopathy.  No effusions. Lungs/Pleura: The central airways are normal. No pneumothorax. Linear opacities in the lungs most consistent with scar or atelectasis. Respiratory motion limits evaluation, particularly of the lower lobes. No suspicious nodules or masses. No focal infiltrates. Musculoskeletal: Degenerative changes in the thoracic spine. No acute bony abnormalities. Previous sternotomy. Review of the MIP images confirms the above findings. CT ABDOMEN and PELVIS FINDINGS Hepatobiliary: Evaluation is limited due to respiratory motion. The patient is status post cholecystectomy. No suspicious focal masses identified. Limited views of the portal vein are unremarkable. Pancreas: Unremarkable. No pancreatic ductal dilatation or surrounding inflammatory changes. Spleen: Evaluation is limited due to respiratory motion. No abnormalities. Adrenals/Urinary Tract: Multiple cysts are associated with the right kidney. No suspicious masses. An extra renal pelvis is associated with the left kidney. No hydronephrosis or suspicious mass. No ureterectasis or ureteral stones. The adrenal glands are normal in appearance. Stomach/Bowel: The distal esophagus and stomach are normal. The small bowel is unremarkable. A few scattered colonic  diverticuli are seen without diverticulitis. Nondependent portions of colon are air-filled and mildly prominent but not dilated by CT criteria. No obstruction. No acute colonic abnormalities. The appendix is well seen on coronal images with no appendicitis. Vascular/Lymphatic: Atherosclerotic changes are seen in the non aneurysmal aorta. No dissection. Atherosclerotic changes extend into the iliac and femoral vessels. No adenopathy. Reproductive: The patient is status post hysterectomy. No suspicious adnexal masses. The ovaries appear to have been retained. Other: There is a fat containing umbilical hernia. Musculoskeletal: Postsurgical changes are seen in the lower lumbar spine. Degenerative change are identified. Review of the MIP images confirms the above findings. IMPRESSION: 1. No pulmonary emboli.  Scattered atelectasis in the lungs. 2. No acute abnormalities in the abdomen to explain the patient's symptoms. 3. Atherosclerotic change in the aorta, iliac vessels, and femoral vessels. Electronically Signed   By: Gerome Sam III M.D   On: 05/05/2016 17:15   Dg Chest Port 1 View  Result Date: 05/04/2016 CLINICAL DATA:  Chest pain, shortness of breath x2 months EXAM: PORTABLE CHEST 1 VIEW COMPARISON:  10/01/2009 FINDINGS: Low lung volumes. Platelike scarring/ atelectasis in the right lower lung. No pleural effusion or pneumothorax. Cardiomegaly.  Postsurgical  changes related to prior CABG. IMPRESSION: Low lung volumes with platelike scarring/ atelectasis in the right lower lung. These results will be called to the ordering clinician or representative by the Radiologist Assistant, and communication documented in the PACS or zVision Dashboard. Electronically Signed   By: Charline BillsSriyesh  Krishnan M.D.   On: 05/04/2016 18:36        Scheduled Meds: . amLODipine  5 mg Oral Daily  . apixaban  5 mg Oral BID  . aspirin EC  81 mg Oral Daily  . atorvastatin  10 mg Oral q1800  . budesonide  0.25 mg Nebulization  BID  . buPROPion  150 mg Oral Daily  . feeding supplement (ENSURE ENLIVE)  237 mL Oral BID BM  . gabapentin  600 mg Oral TID  . insulin aspart  0-9 Units Subcutaneous TID WC  . metoprolol succinate  25 mg Oral Daily  . pantoprazole  40 mg Oral Daily  . sodium chloride flush  3 mL Intravenous Q12H  . Vitamin D (Ergocalciferol)  50,000 Units Oral Once per day on Mon Thu   Continuous Infusions: . sodium chloride       LOS: 1 day     Richarda OverlieABROL,Skylie Hiott, MD [facility] Pager [phone]  If 7PM-7AM, please contact night-coverage [email or web address] Password [email or web address] 05/06/2016, 9:47 AM

## 2016-05-06 NOTE — Progress Notes (Signed)
RT to follow up on CPAP.  Patient resting comfortably on CPAP. Previous settings. 2 LPM O2 bleed in.

## 2016-05-06 NOTE — Progress Notes (Signed)
Patient placed on CPAP for the night without complication. RT will continue to monitor. 

## 2016-05-06 NOTE — Progress Notes (Signed)
Subjective:  Some chest pain with inspiration.   Objective:  Vital Signs in the last 24 hours: Temp:  [97.5 F (36.4 C)-97.6 F (36.4 C)] 97.5 F (36.4 C) (10/30 0306) Pulse Rate:  [49-63] 49 (10/30 0306) Resp:  [20-22] 20 (10/30 0306) BP: (133-134)/(49-94) 134/49 (10/30 0306) SpO2:  [94 %-99 %] 95 % (10/30 0729)  Intake/Output from previous day: No intake or output data in the 24 hours ending 05/06/16 1138  Physical Exam: General appearance: alert, cooperative, no distress and morbidly obese Lungs: clear to auscultation bilaterally Heart: regular rate and rhythm Skin: Skin color, texture, turgor normal. No rashes or lesions Neurologic: Grossly normal   Rate: 53  Rhythm: normal sinus rhythm and sinus bradycardia  Lab Results:  Recent Labs  05/04/16 0219 05/06/16 0240  WBC 6.4 8.8  HGB 9.6* 9.1*  PLT 146* 164    Recent Labs  05/05/16 0344 05/06/16 0240  NA 136 131*  K 4.8 5.4*  CL 106 104  CO2 24 22  GLUCOSE 131* 108*  BUN 12 17  CREATININE 1.37* 1.76*    Recent Labs  05/03/16 2215 05/04/16 0219  TROPONINI 0.03* 0.04*    Recent Labs  05/03/16 1859  INR 1.34    Scheduled Meds: . amLODipine  5 mg Oral Daily  . apixaban  5 mg Oral BID  . aspirin EC  81 mg Oral Daily  . atorvastatin  10 mg Oral q1800  . budesonide  0.25 mg Nebulization BID  . buPROPion  150 mg Oral Daily  . feeding supplement (ENSURE ENLIVE)  237 mL Oral BID BM  . gabapentin  600 mg Oral TID  . insulin aspart  0-9 Units Subcutaneous TID WC  . metoprolol succinate  25 mg Oral Daily  . pantoprazole  40 mg Oral Daily  . sodium chloride flush  3 mL Intravenous Q12H  . Vitamin D (Ergocalciferol)  50,000 Units Oral Once per day on Mon Thu   Continuous Infusions: . sodium chloride 75 mL/hr at 05/06/16 1051   PRN Meds:.acetaminophen **OR** acetaminophen, gi cocktail, ipratropium-albuterol, ondansetron (ZOFRAN) IV   Imaging: Dg Chest 2 View  Result Date:  05/05/2016 CLINICAL DATA:  Shortness of breath. EXAM: CHEST  2 VIEW COMPARISON:  May 04, 2016 FINDINGS: Linear density in the right mid lung may represent scar or atelectasis versus fluid in the minor fissure. Stable cardiomegaly. No pneumothorax. No overt edema. No other acute abnormalities. IMPRESSION: No acute interval change. Electronically Signed   By: Gerome Samavid  Williams III M.D   On: 05/05/2016 18:19   Ct Angio Chest Pe W Or Wo Contrast  Result Date: 05/05/2016 CLINICAL DATA:  Lambert ModySharp mid chest pain. Elevated D-dimer. Nausea and vomiting. EXAM: CT ANGIOGRAPHY CHEST CT ABDOMEN AND PELVIS WITH CONTRAST TECHNIQUE: Multidetector CT imaging of the chest was performed using the standard protocol during bolus administration of intravenous contrast. Multiplanar CT image reconstructions and MIPs were obtained to evaluate the vascular anatomy. Multidetector CT imaging of the abdomen and pelvis was performed using the standard protocol during bolus administration of intravenous contrast. CONTRAST:  80 cc of Isovue 370 COMPARISON:  August 28, 2009 FINDINGS: CTA CHEST FINDINGS Cardiovascular: The thoracic aorta demonstrates atherosclerotic change but no aneurysm or dissection. No pleural or pericardial effusions. Cardiomegaly is identified. There are coronary artery calcifications. Respiratory motion, particularly in the bases, limits evaluation of the pulmonary arteries. Taking respiratory motion into account, no convincing evidence of pulmonary emboli identified. Mediastinum/Nodes: No adenopathy.  No effusions. Lungs/Pleura: The  central airways are normal. No pneumothorax. Linear opacities in the lungs most consistent with scar or atelectasis. Respiratory motion limits evaluation, particularly of the lower lobes. No suspicious nodules or masses. No focal infiltrates. Musculoskeletal: Degenerative changes in the thoracic spine. No acute bony abnormalities. Previous sternotomy. Review of the MIP images confirms the  above findings. CT ABDOMEN and PELVIS FINDINGS Hepatobiliary: Evaluation is limited due to respiratory motion. The patient is status post cholecystectomy. No suspicious focal masses identified. Limited views of the portal vein are unremarkable. Pancreas: Unremarkable. No pancreatic ductal dilatation or surrounding inflammatory changes. Spleen: Evaluation is limited due to respiratory motion. No abnormalities. Adrenals/Urinary Tract: Multiple cysts are associated with the right kidney. No suspicious masses. An extra renal pelvis is associated with the left kidney. No hydronephrosis or suspicious mass. No ureterectasis or ureteral stones. The adrenal glands are normal in appearance. Stomach/Bowel: The distal esophagus and stomach are normal. The small bowel is unremarkable. A few scattered colonic diverticuli are seen without diverticulitis. Nondependent portions of colon are air-filled and mildly prominent but not dilated by CT criteria. No obstruction. No acute colonic abnormalities. The appendix is well seen on coronal images with no appendicitis. Vascular/Lymphatic: Atherosclerotic changes are seen in the non aneurysmal aorta. No dissection. Atherosclerotic changes extend into the iliac and femoral vessels. No adenopathy. Reproductive: The patient is status post hysterectomy. No suspicious adnexal masses. The ovaries appear to have been retained. Other: There is a fat containing umbilical hernia. Musculoskeletal: Postsurgical changes are seen in the lower lumbar spine. Degenerative change are identified. Review of the MIP images confirms the above findings. IMPRESSION: 1. No pulmonary emboli.  Scattered atelectasis in the lungs. 2. No acute abnormalities in the abdomen to explain the patient's symptoms. 3. Atherosclerotic change in the aorta, iliac vessels, and femoral vessels. Electronically Signed   By: Gerome Sam III M.D   On: 05/05/2016 17:15   Ct Abdomen Pelvis W Contrast  Result Date:  05/05/2016 CLINICAL DATA:  Lambert Mody mid chest pain. Elevated D-dimer. Nausea and vomiting. EXAM: CT ANGIOGRAPHY CHEST CT ABDOMEN AND PELVIS WITH CONTRAST TECHNIQUE: Multidetector CT imaging of the chest was performed using the standard protocol during bolus administration of intravenous contrast. Multiplanar CT image reconstructions and MIPs were obtained to evaluate the vascular anatomy. Multidetector CT imaging of the abdomen and pelvis was performed using the standard protocol during bolus administration of intravenous contrast. CONTRAST:  80 cc of Isovue 370 COMPARISON:  August 28, 2009 FINDINGS: CTA CHEST FINDINGS Cardiovascular: The thoracic aorta demonstrates atherosclerotic change but no aneurysm or dissection. No pleural or pericardial effusions. Cardiomegaly is identified. There are coronary artery calcifications. Respiratory motion, particularly in the bases, limits evaluation of the pulmonary arteries. Taking respiratory motion into account, no convincing evidence of pulmonary emboli identified. Mediastinum/Nodes: No adenopathy.  No effusions. Lungs/Pleura: The central airways are normal. No pneumothorax. Linear opacities in the lungs most consistent with scar or atelectasis. Respiratory motion limits evaluation, particularly of the lower lobes. No suspicious nodules or masses. No focal infiltrates. Musculoskeletal: Degenerative changes in the thoracic spine. No acute bony abnormalities. Previous sternotomy. Review of the MIP images confirms the above findings. CT ABDOMEN and PELVIS FINDINGS Hepatobiliary: Evaluation is limited due to respiratory motion. The patient is status post cholecystectomy. No suspicious focal masses identified. Limited views of the portal vein are unremarkable. Pancreas: Unremarkable. No pancreatic ductal dilatation or surrounding inflammatory changes. Spleen: Evaluation is limited due to respiratory motion. No abnormalities. Adrenals/Urinary Tract: Multiple cysts are associated  with the  right kidney. No suspicious masses. An extra renal pelvis is associated with the left kidney. No hydronephrosis or suspicious mass. No ureterectasis or ureteral stones. The adrenal glands are normal in appearance. Stomach/Bowel: The distal esophagus and stomach are normal. The small bowel is unremarkable. A few scattered colonic diverticuli are seen without diverticulitis. Nondependent portions of colon are air-filled and mildly prominent but not dilated by CT criteria. No obstruction. No acute colonic abnormalities. The appendix is well seen on coronal images with no appendicitis. Vascular/Lymphatic: Atherosclerotic changes are seen in the non aneurysmal aorta. No dissection. Atherosclerotic changes extend into the iliac and femoral vessels. No adenopathy. Reproductive: The patient is status post hysterectomy. No suspicious adnexal masses. The ovaries appear to have been retained. Other: There is a fat containing umbilical hernia. Musculoskeletal: Postsurgical changes are seen in the lower lumbar spine. Degenerative change are identified. Review of the MIP images confirms the above findings. IMPRESSION: 1. No pulmonary emboli.  Scattered atelectasis in the lungs. 2. No acute abnormalities in the abdomen to explain the patient's symptoms. 3. Atherosclerotic change in the aorta, iliac vessels, and femoral vessels. Electronically Signed   By: Gerome Samavid  Williams III M.D   On: 05/05/2016 17:15   Dg Chest Port 1 View  Result Date: 05/04/2016 CLINICAL DATA:  Chest pain, shortness of breath x2 months EXAM: PORTABLE CHEST 1 VIEW COMPARISON:  10/01/2009 FINDINGS: Low lung volumes. Platelike scarring/ atelectasis in the right lower lung. No pleural effusion or pneumothorax. Cardiomegaly.  Postsurgical changes related to prior CABG. IMPRESSION: Low lung volumes with platelike scarring/ atelectasis in the right lower lung. These results will be called to the ordering clinician or representative by the Radiologist  Assistant, and communication documented in the PACS or zVision Dashboard. Electronically Signed   By: Charline BillsSriyesh  Krishnan M.D.   On: 05/04/2016 18:36    Cardiac Studies: Echo 05/05/16 Study Conclusions  - Left ventricle: The cavity size was normal. Wall thickness was   increased in a pattern of moderate LVH. Systolic function was   normal. The estimated ejection fraction was in the range of 50%   to 55%. Wall motion was normal; there were no regional wall   motion abnormalities. Doppler parameters are consistent with   abnormal left ventricular relaxation (grade 1 diastolic   dysfunction). - Mitral valve: Mildly calcified annulus. - Left atrium: The atrium was moderately dilated. - Tricuspid valve: There was mild-moderate regurgitation. - Pulmonary arteries: Systolic pressure was moderately increased.   PA peak pressure: 53 mm Hg (S).   Assessment/Plan:  75 y.o.morbidly obese AA femalewith a history of CABG (no details), PAF s/p DCCV Jan 2017, sleep apnea,HTN, dyslipidemia, and chronic diastolic CHF.    Principal Problem:   Chest pain, atypical Active Problems:   GERD (gastroesophageal reflux disease)   Hypertension   Diabetes mellitus (HCC)   Morbid obesity (HCC)   Hx of CABG   Chronic diastolic CHF (congestive heart failure) (HCC)   Persistent atrial fibrillation (HCC)   Nausea, vomiting and diarrhea   Abdominal pain   PAF- DDCV Jan 2017   PLAN:  OP Myoview arranged. CT negative for PE. She is on Eliquis and low dose Toprol for PAF.   Corine ShelterLuke Kilroy PA-C 05/06/2016, 11:38 AM [id number]   Patient seen and examined. Agree with assessment and plan. Maintaining sinus rhythm but bradycardic in low 50's.  Echo demonstrates normal LV Fxn with grade I diastolic dysfunction without focal wall motion abnormalities. Chest pain is atypical and doubt ischemic. Agree with plans  for outpatient nuclear study in future and no need for in hospital evaluation.   Lennette Bihari, MD,  [facility] 05/06/2016 11:54 AM

## 2016-05-06 NOTE — NC FL2 (Signed)
[place] MEDICAID FL2 LEVEL OF CARE SCREENING TOOL     IDENTIFICATION  Patient Name: Lisa [name] Birthdate: April 18, 1941 Sex: female Admission Date (Current Location): 05/03/2016  [facility] [place]   Facility and Address:  [facility], [address], [place], [place] [address]      Provider Number: [id number]  Attending Physician Name and Address:  Richarda OverlieNayana Abrol, MD  Relative Name and Phone Number:       Current Level of Care: Hospital Recommended Level of Care: Skilled Nursing Facility Prior Approval Number:    Date Approved/Denied: 05/06/16 PASRR Number: [id number]  Discharge Plan: SNF    Current Diagnoses: Patient Active Problem List   Diagnosis Date Noted  . Abdominal pain   . Chest pain, atypical 05/03/2016  . Nausea, vomiting and diarrhea 05/03/2016  . Bradycardia 01/29/2016  . Persistent atrial fibrillation (HCC) 04/24/2015  . Ankle fracture 08/18/2014  . Closed right ankle fracture 08/18/2014  . Fall   . PVC (premature ventricular contraction) 03/22/2014  . SOB (shortness of breath) 09/22/2013  . CAD (coronary artery disease) 05/06/2013  . Chronic diastolic CHF (congestive heart failure) (HCC) 05/06/2013  . OSA (obstructive sleep apnea) 05/06/2013  . Morbid obesity (HCC)   . Diastolic dysfunction   . Hyperlipidemia   . GERD (gastroesophageal reflux disease) 10/27/2010  . Gout 10/27/2010  . Hypertension 10/27/2010  . Asthma 10/27/2010  . Diabetes mellitus (HCC) 10/27/2010    Orientation RESPIRATION BLADDER Height & Weight     Self, Time, Situation, Place  O2 (CPAP: 3L) Incontinent Weight: 237 lb 11.2 oz (107.8 kg) Height:  5\' 2"  (157.5 cm)  BEHAVIORAL SYMPTOMS/MOOD NEUROLOGICAL BOWEL NUTRITION STATUS      Incontinent, Continent Diet (Carb modified; thin liquids)  AMBULATORY STATUS COMMUNICATION OF NEEDS Skin   Extensive Assist Verbally Normal                       Personal Care  Assistance Level of Assistance  Bathing, Feeding, Dressing Bathing Assistance: Maximum assistance Feeding assistance: Independent Dressing Assistance: Maximum assistance     Functional Limitations Info  Sight, Hearing, Speech Sight Info: Adequate Hearing Info: Adequate Speech Info: Adequate    SPECIAL CARE FACTORS FREQUENCY  PT (By licensed PT), OT (By licensed OT)     PT Frequency: min 2x week OT Frequency: min 2x week            Contractures Contractures Info: Not present    Additional Factors Info  Code Status, Allergies Code Status Info: Full Allergies Info: No known allergies           Current Medications (05/06/2016):  This is the current hospital active medication list Current Facility-Administered Medications  Medication Dose Route Frequency Provider Last Rate Last Dose  . 0.9 %  sodium chloride infusion   Intravenous Continuous Richarda OverlieNayana Abrol, MD      . acetaminophen (TYLENOL) tablet 650 mg  650 mg Oral Q6H PRN Heloise Beechammair Latif Sheikh, DO   650 mg at 05/05/16 2104   Or  . acetaminophen (TYLENOL) suppository 650 mg  650 mg Rectal Q6H PRN Heloise Beechammair Latif Sheikh, DO      . amLODipine (NORVASC) tablet 5 mg  5 mg Oral Daily Omair Latif Sheikh, DO   5 mg at 05/05/16 0941  . apixaban (ELIQUIS) tablet 5 mg  5 mg Oral BID Heloise Beechammair Latif Sheikh, DO   5 mg at 05/05/16 2059  . aspirin EC tablet 81  mg  81 mg Oral Daily Richarda OverlieNayana Abrol, MD   81 mg at 05/05/16 0941  . atorvastatin (LIPITOR) tablet 10 mg  10 mg Oral q1800 Quintella Reichertraci R Turner, MD   10 mg at 05/05/16 1712  . budesonide (PULMICORT) nebulizer solution 0.25 mg  0.25 mg Nebulization BID Heloise Beechammair Latif Sheikh, DO   0.25 mg at 05/06/16 0729  . buPROPion (WELLBUTRIN XL) 24 hr tablet 150 mg  150 mg Oral Daily [facility] Latif Sheikh, DO   150 mg at 05/05/16 0941  . feeding supplement (ENSURE ENLIVE) (ENSURE ENLIVE) liquid 237 mL  237 mL Oral BID BM Elease EtienneAnand D Hongalgi, MD   237 mL at 05/05/16 1400  . gabapentin (NEURONTIN) tablet 600 mg  600 mg Oral  TID Heloise Beechammair Latif Sheikh, DO   600 mg at 05/05/16 2059  . gi cocktail (Maalox,Lidocaine,Donnatal)  30 mL Oral QID PRN Heloise Beechammair Latif Sheikh, DO      . insulin aspart (novoLOG) injection 0-9 Units  0-9 Units Subcutaneous TID WC Omair Latif Sheikh, DO      . ipratropium-albuterol (DUONEB) 0.5-2.5 (3) MG/3ML nebulizer solution 3 mL  3 mL Nebulization Q6H PRN Heloise Beechammair Latif Sheikh, DO   3 mL at 05/05/16 1014  . metoprolol succinate (TOPROL-XL) 24 hr tablet 25 mg  25 mg Oral Daily [facility] She[facility], DO   25 mg at 05/05/16 0941  . ondansetron (ZOFRAN) injection 4 mg  4 mg Intravenous Q6H PRN Omair Latif Sheikh, DO      . pantoprazole (PROTONIX) EC tablet 40 mg  40 mg Oral Daily [facility] Sheikh, DO   40 mg at 05/05/16 0941  . sodium chloride flush (NS) 0.9 % injection 3 mL  3 mL Intravenous Q12H Omair Latif Sheikh, DO   3 mL at 05/05/16 1000  . Vitamin D (Ergocalciferol) (DRISDOL) capsule 50,000 Units  50,000 Units Oral Once per day on Mon Thu Merlene Laughtermair Latif Sheikh, DO         Discharge Medications: Please see discharge summary for a list of discharge medications.  Relevant Imaging Results:  Relevant Lab Results:   Additional Information SSN: [id number]   Jamey ReasBridget Mayton, [place] [phone]

## 2016-05-06 NOTE — Clinical Social Work Note (Signed)
Clinical Social Work Assessment  Patient Details  Name: Lisa SchneidersGrace A Goodpasture MRN: [id number] Date of Birth: 11-04-1940  Date of referral:  05/06/16               Reason for consult:  Facility Placement                Permission sought to share information with:  Family Supports Permission granted to share information::  Yes, Verbal Permission Granted  Name::     Barnes,[name]  Agency::     Relationship::  Daughter  Contact Information:  [phone]  Housing/Transportation Living arrangements for the past 2 months:  Single Family Home Source of Information:  Patient Patient Interpreter Needed:  None Criminal Activity/Legal Involvement Pertinent to Current Situation/Hospitalization:  No - Comment as needed Significant Relationships:  Adult Children Lives with:  Self Do you feel safe going back to the place where you live?  Yes Need for family participation in patient care:  Yes (Comment)  Care giving concerns:  No family or friends at the bedside at this time.    Social Worker assessment / plan: CSW spoke with patient at bedside. Patient was appropriate and engaged. Patient reported living at home by herself prior to admission and plans to return home following SNF. Patient reports having family support. Patients daughter, Elnita MaxwellCheryl lives in [place]. Patient verbalized agreement to SNF placement following discharge. Patient requesting CSW send referral to facilities in the [place] area. CSW will continue to facilitate SNF placement.   Employment status:  Unemployed Health and safety inspectornsurance information:  Teacher, English as a foreign languageManaged Medicare ([place] programmer[place] Healthcare Medicare) PT Recommendations:  Skilled Nursing Facility Information / Referral to community resources:  Skilled Nursing Facility  Patient/Family's Response to care:  Patient verbalized understanding of CSW role and appreciation of support. Patient verbalized agreement to SNF placement once medically stable for discharge. Patient denied any questions or concerns  about care.   Patient/Family's Understanding of and Emotional Response to Diagnosis, Current Treatment, and Prognosis:  Patient realistic about physical limitations. Patient understanding of SNF placement and agreeable to placement once medically stable for discharge. Patient denied any further questions or concerns regarding treatment plan at this time.  Emotional Assessment Appearance:  Appears stated age Attitude/Demeanor/Rapport:   (Patient was appropriate.) Affect (typically observed):  Accepting, Appropriate, Pleasant Orientation:  Oriented to Self, Oriented to Place, Oriented to  Time, Oriented to Situation Alcohol / Substance use:  Not Applicable Psych involvement (Current and /or in the community):  No (Comment)  Discharge Needs  Concerns to be addressed:  No discharge needs identified Readmission within the last 30 days:  No Current discharge risk:  Dependent with Mobility Barriers to Discharge:  Continued Medical Work up  [employer] Station[employer]Bridget Mayton, [employer]CSWA [phone]

## 2016-05-07 ENCOUNTER — Ambulatory Visit: Payer: Medicare Other

## 2016-05-07 ENCOUNTER — Observation Stay (HOSPITAL_COMMUNITY): Payer: Medicare Other

## 2016-05-07 DIAGNOSIS — D696 Thrombocytopenia, unspecified: Secondary | ICD-10-CM | POA: Diagnosis not present

## 2016-05-07 DIAGNOSIS — R197 Diarrhea, unspecified: Secondary | ICD-10-CM | POA: Diagnosis not present

## 2016-05-07 DIAGNOSIS — N179 Acute kidney failure, unspecified: Secondary | ICD-10-CM | POA: Diagnosis not present

## 2016-05-07 DIAGNOSIS — I130 Hypertensive heart and chronic kidney disease with heart failure and stage 1 through stage 4 chronic kidney disease, or unspecified chronic kidney disease: Secondary | ICD-10-CM | POA: Diagnosis not present

## 2016-05-07 DIAGNOSIS — E0800 Diabetes mellitus due to underlying condition with hyperosmolarity without nonketotic hyperglycemic-hyperosmolar coma (NKHHC): Secondary | ICD-10-CM

## 2016-05-07 DIAGNOSIS — D631 Anemia in chronic kidney disease: Secondary | ICD-10-CM | POA: Diagnosis not present

## 2016-05-07 DIAGNOSIS — J9601 Acute respiratory failure with hypoxia: Secondary | ICD-10-CM | POA: Diagnosis not present

## 2016-05-07 DIAGNOSIS — N19 Unspecified kidney failure: Secondary | ICD-10-CM | POA: Diagnosis not present

## 2016-05-07 DIAGNOSIS — N170 Acute kidney failure with tubular necrosis: Secondary | ICD-10-CM | POA: Diagnosis not present

## 2016-05-07 DIAGNOSIS — I2510 Atherosclerotic heart disease of native coronary artery without angina pectoris: Secondary | ICD-10-CM | POA: Diagnosis not present

## 2016-05-07 DIAGNOSIS — R0789 Other chest pain: Secondary | ICD-10-CM | POA: Diagnosis not present

## 2016-05-07 DIAGNOSIS — I081 Rheumatic disorders of both mitral and tricuspid valves: Secondary | ICD-10-CM | POA: Diagnosis not present

## 2016-05-07 DIAGNOSIS — E785 Hyperlipidemia, unspecified: Secondary | ICD-10-CM | POA: Diagnosis not present

## 2016-05-07 DIAGNOSIS — I481 Persistent atrial fibrillation: Secondary | ICD-10-CM | POA: Diagnosis not present

## 2016-05-07 DIAGNOSIS — I5032 Chronic diastolic (congestive) heart failure: Secondary | ICD-10-CM | POA: Diagnosis not present

## 2016-05-07 DIAGNOSIS — N281 Cyst of kidney, acquired: Secondary | ICD-10-CM | POA: Diagnosis not present

## 2016-05-07 DIAGNOSIS — E1122 Type 2 diabetes mellitus with diabetic chronic kidney disease: Secondary | ICD-10-CM | POA: Diagnosis not present

## 2016-05-07 DIAGNOSIS — R296 Repeated falls: Secondary | ICD-10-CM | POA: Diagnosis not present

## 2016-05-07 DIAGNOSIS — Z951 Presence of aortocoronary bypass graft: Secondary | ICD-10-CM | POA: Diagnosis not present

## 2016-05-07 DIAGNOSIS — I480 Paroxysmal atrial fibrillation: Secondary | ICD-10-CM | POA: Diagnosis not present

## 2016-05-07 DIAGNOSIS — K219 Gastro-esophageal reflux disease without esophagitis: Secondary | ICD-10-CM | POA: Diagnosis not present

## 2016-05-07 DIAGNOSIS — I4510 Unspecified right bundle-branch block: Secondary | ICD-10-CM | POA: Diagnosis not present

## 2016-05-07 DIAGNOSIS — R112 Nausea with vomiting, unspecified: Secondary | ICD-10-CM | POA: Diagnosis not present

## 2016-05-07 DIAGNOSIS — N183 Chronic kidney disease, stage 3 (moderate): Secondary | ICD-10-CM | POA: Diagnosis not present

## 2016-05-07 DIAGNOSIS — G4733 Obstructive sleep apnea (adult) (pediatric): Secondary | ICD-10-CM | POA: Diagnosis not present

## 2016-05-07 DIAGNOSIS — Z7901 Long term (current) use of anticoagulants: Secondary | ICD-10-CM | POA: Diagnosis not present

## 2016-05-07 LAB — COMPREHENSIVE METABOLIC PANEL
ALBUMIN: 2.4 g/dL — AB (ref 3.5–5.0)
ALT: 9 U/L — ABNORMAL LOW (ref 14–54)
ANION GAP: 9 (ref 5–15)
AST: 22 U/L (ref 15–41)
Alkaline Phosphatase: 46 U/L (ref 38–126)
BILIRUBIN TOTAL: 0.7 mg/dL (ref 0.3–1.2)
BUN: 22 mg/dL — ABNORMAL HIGH (ref 6–20)
CHLORIDE: 105 mmol/L (ref 101–111)
CO2: 20 mmol/L — AB (ref 22–32)
Calcium: 8.7 mg/dL — ABNORMAL LOW (ref 8.9–10.3)
Creatinine, Ser: 1.91 mg/dL — ABNORMAL HIGH (ref 0.44–1.00)
GFR calc Af Amer: 28 mL/min — ABNORMAL LOW (ref >60)
GFR calc non Af Amer: 24 mL/min — ABNORMAL LOW (ref >60)
GLUCOSE: 139 mg/dL — AB (ref 65–99)
POTASSIUM: 5.2 mmol/L — AB (ref 3.5–5.1)
SODIUM: 134 mmol/L — AB (ref 135–145)
TOTAL PROTEIN: 6.1 g/dL — AB (ref 6.5–8.1)

## 2016-05-07 LAB — GLUCOSE, CAPILLARY
GLUCOSE-CAPILLARY: 84 mg/dL (ref 65–99)
Glucose-Capillary: 101 mg/dL — ABNORMAL HIGH (ref 65–99)
Glucose-Capillary: 99 mg/dL (ref 65–99)
Glucose-Capillary: 107 mg/dL — ABNORMAL HIGH (ref 65–99)

## 2016-05-07 MED ORDER — SODIUM CHLORIDE 0.9 % IV SOLN
INTRAVENOUS | Status: AC
  Administered 2016-05-07: 15:00:00 via INTRAVENOUS

## 2016-05-07 MED ORDER — SODIUM POLYSTYRENE SULFONATE 15 GM/60ML PO SUSP
15.0000 g | Freq: Once | ORAL | Status: AC
  Administered 2016-05-07: 15 g via ORAL
  Filled 2016-05-07: qty 60

## 2016-05-07 NOTE — Progress Notes (Signed)
To xray via bed for renal ultrasound @ 1:00pm  Governor SpeckingKathryn Tanya Marvin, RN

## 2016-05-07 NOTE — Progress Notes (Addendum)
PROGRESS NOTE  ELLEANNA MELLING  ZOX:[id number] DOB: 04-28-1941  DOA: 05/03/2016 PCP: Laurena Slimmer, MD   Brief Narrative:  75 year old female with PMH of CAD, HTN, HLD, chronic diastolic CHF, osteoarthritis, persistent A. fib, OSA, DM not on insulin presented from Dr. Turner/cardiologist office to ED on 05/03/16 for evaluation of chest pain. Poor historian limited history taking. Chest pain described as worse with chest wall movements and deep inspiration. Associated couple days history of nausea, vomiting and diarrhea.  Cardiology consulted for chest pain. Now has acute kidney injury  Assessment & Plan:   Principal Problem:   Chest pain, atypical Active Problems:   GERD (gastroesophageal reflux disease)   Hypertension   Diabetes mellitus (HCC)   Morbid obesity (HCC)   Hx of CABG   Chronic diastolic CHF (congestive heart failure) (HCC)   Persistent atrial fibrillation (HCC)   Nausea, vomiting and diarrhea   Abdominal pain   PAF- DDCV Jan 2017   1. Chest pain: Seemed very atypical suspicious for musculoskeletal etiology and was worse with chest wall movements or deep inspiration and was reproducible to palpation. Does not even seem like GI in etiology. Troponin minimally elevated but flat trend. EKG in office nonischemic with chronic RBBB. 2-D echo results as below: Normal EF without wall motion abnormalities (may consider outpatient appears stress test). Patient poor historian. Cardiology follow-up appreciated. Discussed with Dr. Mayford Knife d-dimer positive and hence  CTA chest to rule out PE was done.CTA chest and non contrasted CT Abd: No PE & No acute abnormalities. 2-D echo without wall motion abnormalities. Patient would benefit from outpatient nuclear study. 2. Nausea, vomiting and intermittent diarrhea: Unclear etiology. Denies sick contacts. Lipase normal. CT abdomen and pelvis negative. LFTs unremarkable. Symptoms resolved and verified with RN. 3. Hypokalemia: Replaced. Magnesium  normal. 4. Type II DM: Continue SSI. Hemoglobin A1c 5.0 5. CAD status post remote CABG: Management as above. 6. Chronic diastolic CHF: Appears compensated. Lasix currently held secondary to poor oral intake and GI losses. Renal insufficiency 7. Hyperlipidemia: Simvastatin changed to Lipitor since she is on amlodipine. 8. Essential hypertension: Controlled on beta blockers and amlodipine. 9. OSA: Has not been using her CPAP at home because she needs a new mask. 10. Persistent A. fib: Maintaining sinus rhythm. Remains on Toprol and Apixaban 11. Recurrent falls. PT and OT evaluation. 12. Anemia: Stable. Follow CBCs. Mild thrombocytopenia. 13. 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 Creatinine has increased from 1.37> 1.76>1.91 despite IV fluids, follow BMP. Continue IV fluids. Renal ultrasound today. Consider nephrology consultation if no improvement   DVT prophylaxis: Apixaban Code Status: Full Family Communication: None at bedside Disposition Plan: PT recommends SNF, anticipate discharge if renal function is improving   Consultants:   Cardiology  Procedures:   2-D echo 05/05/16: Study Conclusions  - Left ventricle: The cavity size was normal. Wall thickness was   increased in a pattern of moderate LVH. Systolic function was   normal. The estimated ejection fraction was in the range of 50%   to 55%. Wall motion was normal; there were no regional wall   motion abnormalities. Doppler parameters are consistent with   abnormal left ventricular relaxation (grade 1 diastolic   dysfunction). - Mitral valve: Mildly calcified annulus. - Left atrium: The atrium was moderately dilated. - Tricuspid valve: There was mild-moderate regurgitation. - Pulmonary arteries: Systolic pressure was moderately increased.   PA peak pressure: 53 mm Hg (S).  Antimicrobials:   None  Subjective:  Resting comfortably, denies chest pain or  shortness of breath    Objective:  Vitals:   05/06/16 1332 05/06/16 1930 05/06/16 2132 05/07/16 0556  BP: (!) 75/42 (!) 115/100  (!) 144/52  Pulse: (!) 59 80  (!) 44  Resp: 18 20  20   Temp: 98.4 F (36.9 C) 97.5 F (36.4 C)  97.4 F (36.3 C)  TempSrc: Oral Oral  Oral  SpO2: 100% 91% 92% 90%  Weight:    107.9 kg (237 lb 12.8 oz)  Height:        Intake/Output Summary (Last 24 hours) at 05/07/16 0948 Last data filed at 05/07/16 0600  Gross per 24 hour  Intake          2276.25 ml  Output                0 ml  Net          2276.25 ml   Filed Weights   05/04/16 0418 05/05/16 0405 05/07/16 0556  Weight: 105.4 kg (232 lb 6.4 oz) 107.8 kg (237 lb 11.2 oz) 107.9 kg (237 lb 12.8 oz)    Examination:  General exam: Pleasant elderly female lying comfortably propped up in bed. Respiratory system: Distant breath sounds but seems clear to auscultation. Respiratory effort normal. Reproducible chest wall tenderness-less prominent than yesterday. Cardiovascular system: S1 & S2 heard, RRR. No JVD, murmurs, rubs, gallops or clicks. No pedal edema. Telemetry: Sinus rhythm with BBB morphology. Gastrointestinal system: Abdomen is nondistended, soft and nontender. No organomegaly or masses felt. Normal bowel sounds heard. Central nervous system: Alert and oriented. No focal neurological deficits. Extremities: Symmetric 5 x 5 power. Skin: No rashes, lesions or ulcers Psychiatry: Judgement and insight appear impaired & affect appropriate.     Data Reviewed: I have personally reviewed following labs and imaging studies  CBC:  Recent Labs Lab 05/03/16 1859 05/04/16 0219 05/06/16 0240  WBC 6.2 6.4 8.8  NEUTROABS 3.8 3.9  --   HGB 10.2* 9.6* 9.1*  HCT 30.6* 28.9* 28.6*  MCV 95.3 95.1 98.3  PLT 152 146* 164   Basic Metabolic Panel:  Recent Labs Lab 05/03/16 1859 05/03/16 2215 05/04/16 0219 05/04/16 0804 05/05/16 0344 05/06/16 0240 05/07/16 0152  NA 138 140  --  137 136 131*  134*  K 2.9* 3.0*  --  3.6 4.8 5.4* 5.2*  CL 103 106  --  104 106 104 105  CO2 24 26  --  23 24 22  20*  GLUCOSE 76 89  --  79 131* 108* 139*  BUN 8 7  --  8 12 17  22*  CREATININE 1.27* 1.19*  --  1.22* 1.37* 1.76* 1.91*  CALCIUM 8.6* 8.4*  --  8.1* 8.6* 8.4* 8.7*  MG 1.4*  --  2.0  --   --   --   --   PHOS 3.0  --  3.3  --   --   --   --    GFR: Estimated Creatinine Clearance: 28.5 mL/min (by C-G formula based on SCr of 1.91 mg/dL (H)). Liver Function Tests:  Recent Labs Lab 05/03/16 1859 05/03/16 2215 05/07/16 0152  AST 22 19 22   ALT 8* 8* 9*  ALKPHOS 43 41 46  BILITOT 1.3* 1.0 0.7  PROT 6.2* 5.6* 6.1*  ALBUMIN 2.8* 2.5* 2.4*    Recent Labs Lab 05/03/16 2215  LIPASE 21   No results for input(s): AMMONIA in the last 168 hours. Coagulation Profile:  Recent Labs Lab  05/03/16 1859  INR 1.34   Cardiac Enzymes:  Recent Labs Lab 05/03/16 1859 05/03/16 2215 05/04/16 0219  TROPONINI 0.04* 0.03* 0.04*   BNP (last 3 results) No results for input(s): PROBNP in the last 8760 hours. HbA1C: No results for input(s): HGBA1C in the last 72 hours. CBG:  Recent Labs Lab 05/06/16 0556 05/06/16 1116 05/06/16 1618 05/06/16 2143 05/07/16 0617  GLUCAP 88 93 94 117* 101*   Lipid Profile: No results for input(s): CHOL, HDL, LDLCALC, TRIG, CHOLHDL, LDLDIRECT in the last 72 hours. Thyroid Function Tests: No results for input(s): TSH, T4TOTAL, FREET4, T3FREE, THYROIDAB in the last 72 hours. Anemia Panel: No results for input(s): VITAMINB12, FOLATE, FERRITIN, TIBC, IRON, RETICCTPCT in the last 72 hours.  Sepsis Labs: No results for input(s): PROCALCITON, LATICACIDVEN in the last 168 hours.  Recent Results (from the past 240 hour(s))  Urine culture     Status: Abnormal   Collection Time: 05/03/16  7:25 PM  Result Value Ref Range Status   Specimen Description URINE, RANDOM  Final   Special Requests NONE  Final   Culture MULTIPLE SPECIES PRESENT, SUGGEST RECOLLECTION  (A)  Final   Report Status 05/05/2016 FINAL  Final         Radiology Studies: Dg Chest 2 View  Result Date: 05/05/2016 CLINICAL DATA:  Shortness of breath. EXAM: CHEST  2 VIEW COMPARISON:  May 04, 2016 FINDINGS: Linear density in the right mid lung may represent scar or atelectasis versus fluid in the minor fissure. Stable cardiomegaly. No pneumothorax. No overt edema. No other acute abnormalities. IMPRESSION: No acute interval change. Electronically Signed   By: Gerome Sam III M.D   On: 05/05/2016 18:19   Ct Angio Chest Pe W Or Wo Contrast  Result Date: 05/05/2016 CLINICAL DATA:  Lambert Mody mid chest pain. Elevated D-dimer. Nausea and vomiting. EXAM: CT ANGIOGRAPHY CHEST CT ABDOMEN AND PELVIS WITH CONTRAST TECHNIQUE: Multidetector CT imaging of the chest was performed using the standard protocol during bolus administration of intravenous contrast. Multiplanar CT image reconstructions and MIPs were obtained to evaluate the vascular anatomy. Multidetector CT imaging of the abdomen and pelvis was performed using the standard protocol during bolus administration of intravenous contrast. CONTRAST:  80 cc of Isovue 370 COMPARISON:  August 28, 2009 FINDINGS: CTA CHEST FINDINGS Cardiovascular: The thoracic aorta demonstrates atherosclerotic change but no aneurysm or dissection. No pleural or pericardial effusions. Cardiomegaly is identified. There are coronary artery calcifications. Respiratory motion, particularly in the bases, limits evaluation of the pulmonary arteries. Taking respiratory motion into account, no convincing evidence of pulmonary emboli identified. Mediastinum/Nodes: No adenopathy.  No effusions. Lungs/Pleura: The central airways are normal. No pneumothorax. Linear opacities in the lungs most consistent with scar or atelectasis. Respiratory motion limits evaluation, particularly of the lower lobes. No suspicious nodules or masses. No focal infiltrates. Musculoskeletal: Degenerative  changes in the thoracic spine. No acute bony abnormalities. Previous sternotomy. Review of the MIP images confirms the above findings. CT ABDOMEN and PELVIS FINDINGS Hepatobiliary: Evaluation is limited due to respiratory motion. The patient is status post cholecystectomy. No suspicious focal masses identified. Limited views of the portal vein are unremarkable. Pancreas: Unremarkable. No pancreatic ductal dilatation or surrounding inflammatory changes. Spleen: Evaluation is limited due to respiratory motion. No abnormalities. Adrenals/Urinary Tract: Multiple cysts are associated with the right kidney. No suspicious masses. An extra renal pelvis is associated with the left kidney. No hydronephrosis or suspicious mass. No ureterectasis or ureteral stones. The adrenal glands are normal in appearance.  Stomach/Bowel: The distal esophagus and stomach are normal. The small bowel is unremarkable. A few scattered colonic diverticuli are seen without diverticulitis. Nondependent portions of colon are air-filled and mildly prominent but not dilated by CT criteria. No obstruction. No acute colonic abnormalities. The appendix is well seen on coronal images with no appendicitis. Vascular/Lymphatic: Atherosclerotic changes are seen in the non aneurysmal aorta. No dissection. Atherosclerotic changes extend into the iliac and femoral vessels. No adenopathy. Reproductive: The patient is status post hysterectomy. No suspicious adnexal masses. The ovaries appear to have been retained. Other: There is a fat containing umbilical hernia. Musculoskeletal: Postsurgical changes are seen in the lower lumbar spine. Degenerative change are identified. Review of the MIP images confirms the above findings. IMPRESSION: 1. No pulmonary emboli.  Scattered atelectasis in the lungs. 2. No acute abnormalities in the abdomen to explain the patient's symptoms. 3. Atherosclerotic change in the aorta, iliac vessels, and femoral vessels. Electronically  Signed   By: Gerome Sam III M.D   On: 05/05/2016 17:15   Ct Abdomen Pelvis W Contrast  Result Date: 05/05/2016 CLINICAL DATA:  Lambert Mody mid chest pain. Elevated D-dimer. Nausea and vomiting. EXAM: CT ANGIOGRAPHY CHEST CT ABDOMEN AND PELVIS WITH CONTRAST TECHNIQUE: Multidetector CT imaging of the chest was performed using the standard protocol during bolus administration of intravenous contrast. Multiplanar CT image reconstructions and MIPs were obtained to evaluate the vascular anatomy. Multidetector CT imaging of the abdomen and pelvis was performed using the standard protocol during bolus administration of intravenous contrast. CONTRAST:  80 cc of Isovue 370 COMPARISON:  August 28, 2009 FINDINGS: CTA CHEST FINDINGS Cardiovascular: The thoracic aorta demonstrates atherosclerotic change but no aneurysm or dissection. No pleural or pericardial effusions. Cardiomegaly is identified. There are coronary artery calcifications. Respiratory motion, particularly in the bases, limits evaluation of the pulmonary arteries. Taking respiratory motion into account, no convincing evidence of pulmonary emboli identified. Mediastinum/Nodes: No adenopathy.  No effusions. Lungs/Pleura: The central airways are normal. No pneumothorax. Linear opacities in the lungs most consistent with scar or atelectasis. Respiratory motion limits evaluation, particularly of the lower lobes. No suspicious nodules or masses. No focal infiltrates. Musculoskeletal: Degenerative changes in the thoracic spine. No acute bony abnormalities. Previous sternotomy. Review of the MIP images confirms the above findings. CT ABDOMEN and PELVIS FINDINGS Hepatobiliary: Evaluation is limited due to respiratory motion. The patient is status post cholecystectomy. No suspicious focal masses identified. Limited views of the portal vein are unremarkable. Pancreas: Unremarkable. No pancreatic ductal dilatation or surrounding inflammatory changes. Spleen: Evaluation  is limited due to respiratory motion. No abnormalities. Adrenals/Urinary Tract: Multiple cysts are associated with the right kidney. No suspicious masses. An extra renal pelvis is associated with the left kidney. No hydronephrosis or suspicious mass. No ureterectasis or ureteral stones. The adrenal glands are normal in appearance. Stomach/Bowel: The distal esophagus and stomach are normal. The small bowel is unremarkable. A few scattered colonic diverticuli are seen without diverticulitis. Nondependent portions of colon are air-filled and mildly prominent but not dilated by CT criteria. No obstruction. No acute colonic abnormalities. The appendix is well seen on coronal images with no appendicitis. Vascular/Lymphatic: Atherosclerotic changes are seen in the non aneurysmal aorta. No dissection. Atherosclerotic changes extend into the iliac and femoral vessels. No adenopathy. Reproductive: The patient is status post hysterectomy. No suspicious adnexal masses. The ovaries appear to have been retained. Other: There is a fat containing umbilical hernia. Musculoskeletal: Postsurgical changes are seen in the lower lumbar spine. Degenerative change are identified.  Review of the MIP images confirms the above findings. IMPRESSION: 1. No pulmonary emboli.  Scattered atelectasis in the lungs. 2. No acute abnormalities in the abdomen to explain the patient's symptoms. 3. Atherosclerotic change in the aorta, iliac vessels, and femoral vessels. Electronically Signed   By: Gerome Samavid  Williams III M.D   On: 05/05/2016 17:15        Scheduled Meds: . amLODipine  5 mg Oral Daily  . apixaban  5 mg Oral BID  . aspirin EC  81 mg Oral Daily  . atorvastatin  10 mg Oral q1800  . budesonide  0.25 mg Nebulization BID  . buPROPion  150 mg Oral Daily  . feeding supplement (ENSURE ENLIVE)  237 mL Oral BID BM  . gabapentin  600 mg Oral TID  . insulin aspart  0-9 Units Subcutaneous TID WC  . metoprolol succinate  25 mg Oral Daily  .  pantoprazole  40 mg Oral Daily  . sodium chloride flush  3 mL Intravenous Q12H  . Vitamin D (Ergocalciferol)  50,000 Units Oral Once per day on Mon Thu   Continuous Infusions: . sodium chloride 75 mL/hr at 05/06/16 1051     LOS: 0 days     Richarda OverlieABROL,Castulo Scarpelli, MD [facility] Pager [phone]  If 7PM-7AM, please contact night-coverage [email or web address] Password [email or web address] 05/07/2016, 9:48 AM

## 2016-05-07 NOTE — Progress Notes (Signed)
Physical Therapy Treatment Patient Details Name: Lisa [name] MRN: [id number] DOB: 06-29-1941 Today's Date: 05/07/2016    History of Present Illness Pt is a 75 y.o female who presents with chest pain, SOB, decreased PO intake, and diarrhea. PMH includes CAD, HTN, chronic diastolic CHF.    PT Comments    The pt is unable to participate in many functional activities due to decreased strength, balance, and endurance.  Pt was able to stand two times from EOB today. Attempted to obtain O2 saturations, but unable to obtain reading Pt would benefit from further PT in order to increase her functional independence.  Continue with POC and gait train next session.  Follow Up Recommendations  SNF;Supervision/Assistance - 24 hour     Equipment Recommendations  None recommended by PT    Recommendations for Other Services       Precautions / Restrictions Precautions Precautions: Fall Precaution Comments: Very SOB upon eval Restrictions Weight Bearing Restrictions: No    Mobility  Bed Mobility Overal bed mobility: Needs Assistance;+2 for physical assistance Bed Mobility: Rolling;Sidelying to Sit;Sit to Supine Rolling: Mod assist Sidelying to sit: Min assist;+2 for physical assistance   Sit to supine: Mod assist;+2 for physical assistance   General bed mobility comments: Pt required cues for technique and to assist legs on/off bed.  Assist to elevate trunk from bed.  Transfers Overall transfer level: Needs assistance Equipment used: Rolling walker (2 wheeled) Transfers: Sit to/from Stand Sit to Stand: Max assist;+2 physical assistance         General transfer comment: Pt required support to maintain standing position.  VCs for technique and hand placement.  sit to stand x 2. Pt able to maintain standing position for 10 minutes.  Ambulation/Gait                 Stairs            Wheelchair Mobility    Modified Rankin (Stroke Patients Only)       Balance  Overall balance assessment: Needs assistance   Sitting balance-Leahy Scale: Poor Sitting balance - Comments: Even with BUE support, pt had difficulty maintaining upright posture.    Standing balance support: Bilateral upper extremity supported Standing balance-Leahy Scale: Fair Standing balance comment: Pt was able to maintain balance with heavy use of her arms.  Pt attemped reaching to clean herself but was unable.                    Cognition Arousal/Alertness: Awake/alert Behavior During Therapy: WFL for tasks assessed/performed Overall Cognitive Status: Difficult to assess                      Exercises      General Comments        Pertinent Vitals/Pain Pain Assessment: Faces Faces Pain Scale: Hurts little more Pain Location: Back Pain Descriptors / Indicators: Discomfort Pain Intervention(s): Limited activity within patient's tolerance;Monitored during session    Home Living                      Prior Function            PT Goals (current goals can now be found in the care plan section) Acute Rehab PT Goals Patient Stated Goal: Pt did not state goals during session.  PT Goal Formulation: With patient/family Time For Goal Achievement: 05/19/16 Potential to Achieve Goals: Good Progress towards PT goals: Progressing toward goals    Frequency  Min 2X/week      PT Plan      Co-evaluation             End of Session Equipment Utilized During Treatment: Gait belt Activity Tolerance: Treatment limited secondary to medical complications (Comment) Patient left: in bed;with call bell/phone within reach     Time: 1547-1611 PT Time Calculation (min) (ACUTE ONLY): 24 min  Charges:  $Therapeutic Activity: 23-37 mins                    G Codes:      Cathleen CortiMary Shanik [place] 05/07/2016, 5:45 PM  Lisa [name], SPTA Pager: [phone]

## 2016-05-07 NOTE — Progress Notes (Signed)
Placed patient on CPAP for the night via auto-mode with minimum pressure set at 8cm and maximum pressure set at 20cm. Oxygen set at 5lpm with Sp02=94%

## 2016-05-08 DIAGNOSIS — I130 Hypertensive heart and chronic kidney disease with heart failure and stage 1 through stage 4 chronic kidney disease, or unspecified chronic kidney disease: Secondary | ICD-10-CM | POA: Diagnosis not present

## 2016-05-08 DIAGNOSIS — N183 Chronic kidney disease, stage 3 (moderate): Secondary | ICD-10-CM | POA: Diagnosis not present

## 2016-05-08 DIAGNOSIS — I5032 Chronic diastolic (congestive) heart failure: Secondary | ICD-10-CM | POA: Diagnosis not present

## 2016-05-08 DIAGNOSIS — K219 Gastro-esophageal reflux disease without esophagitis: Secondary | ICD-10-CM | POA: Diagnosis not present

## 2016-05-08 DIAGNOSIS — I481 Persistent atrial fibrillation: Secondary | ICD-10-CM | POA: Diagnosis not present

## 2016-05-08 DIAGNOSIS — G4733 Obstructive sleep apnea (adult) (pediatric): Secondary | ICD-10-CM | POA: Diagnosis not present

## 2016-05-08 DIAGNOSIS — D696 Thrombocytopenia, unspecified: Secondary | ICD-10-CM | POA: Diagnosis not present

## 2016-05-08 DIAGNOSIS — D631 Anemia in chronic kidney disease: Secondary | ICD-10-CM | POA: Diagnosis not present

## 2016-05-08 DIAGNOSIS — R079 Chest pain, unspecified: Secondary | ICD-10-CM | POA: Diagnosis present

## 2016-05-08 DIAGNOSIS — R197 Diarrhea, unspecified: Secondary | ICD-10-CM | POA: Diagnosis not present

## 2016-05-08 DIAGNOSIS — I081 Rheumatic disorders of both mitral and tricuspid valves: Secondary | ICD-10-CM | POA: Diagnosis not present

## 2016-05-08 DIAGNOSIS — E785 Hyperlipidemia, unspecified: Secondary | ICD-10-CM | POA: Diagnosis not present

## 2016-05-08 DIAGNOSIS — I4510 Unspecified right bundle-branch block: Secondary | ICD-10-CM | POA: Diagnosis not present

## 2016-05-08 DIAGNOSIS — R296 Repeated falls: Secondary | ICD-10-CM | POA: Diagnosis not present

## 2016-05-08 DIAGNOSIS — E1122 Type 2 diabetes mellitus with diabetic chronic kidney disease: Secondary | ICD-10-CM | POA: Diagnosis not present

## 2016-05-08 DIAGNOSIS — I480 Paroxysmal atrial fibrillation: Secondary | ICD-10-CM | POA: Diagnosis not present

## 2016-05-08 DIAGNOSIS — I10 Essential (primary) hypertension: Secondary | ICD-10-CM | POA: Diagnosis not present

## 2016-05-08 DIAGNOSIS — N179 Acute kidney failure, unspecified: Secondary | ICD-10-CM | POA: Diagnosis not present

## 2016-05-08 DIAGNOSIS — Z951 Presence of aortocoronary bypass graft: Secondary | ICD-10-CM | POA: Diagnosis not present

## 2016-05-08 DIAGNOSIS — R112 Nausea with vomiting, unspecified: Secondary | ICD-10-CM | POA: Diagnosis not present

## 2016-05-08 DIAGNOSIS — J9601 Acute respiratory failure with hypoxia: Secondary | ICD-10-CM | POA: Diagnosis not present

## 2016-05-08 DIAGNOSIS — R0789 Other chest pain: Secondary | ICD-10-CM | POA: Diagnosis not present

## 2016-05-08 DIAGNOSIS — I2510 Atherosclerotic heart disease of native coronary artery without angina pectoris: Secondary | ICD-10-CM | POA: Diagnosis not present

## 2016-05-08 DIAGNOSIS — N281 Cyst of kidney, acquired: Secondary | ICD-10-CM | POA: Diagnosis not present

## 2016-05-08 DIAGNOSIS — Z7901 Long term (current) use of anticoagulants: Secondary | ICD-10-CM | POA: Diagnosis not present

## 2016-05-08 LAB — GLUCOSE, CAPILLARY
GLUCOSE-CAPILLARY: 98 mg/dL (ref 65–99)
Glucose-Capillary: 117 mg/dL — ABNORMAL HIGH (ref 65–99)
Glucose-Capillary: 90 mg/dL (ref 65–99)
Glucose-Capillary: 126 mg/dL — ABNORMAL HIGH (ref 65–99)

## 2016-05-08 LAB — BASIC METABOLIC PANEL
ANION GAP: 6 (ref 5–15)
BUN: 22 mg/dL — ABNORMAL HIGH (ref 6–20)
CALCIUM: 8.6 mg/dL — AB (ref 8.9–10.3)
CO2: 24 mmol/L (ref 22–32)
Chloride: 105 mmol/L (ref 101–111)
Creatinine, Ser: 1.45 mg/dL — ABNORMAL HIGH (ref 0.44–1.00)
GFR, EST AFRICAN AMERICAN: 39 mL/min — AB (ref >60)
GFR, EST NON AFRICAN AMERICAN: 34 mL/min — AB (ref >60)
Glucose, Bld: 144 mg/dL — ABNORMAL HIGH (ref 65–99)
Potassium: 5.1 mmol/L (ref 3.5–5.1)
Sodium: 135 mmol/L (ref 135–145)

## 2016-05-08 MED ORDER — IPRATROPIUM-ALBUTEROL 0.5-2.5 (3) MG/3ML IN SOLN: 3.0000 mL | Freq: Four times a day (QID) | RESPIRATORY_TRACT | Status: AC | PRN

## 2016-05-08 MED ORDER — GABAPENTIN 600 MG PO TABS: 600.0000 mg | ORAL_TABLET | Freq: Two times a day (BID) | ORAL | Status: DC

## 2016-05-08 MED ORDER — ATORVASTATIN CALCIUM 10 MG PO TABS: 10.0000 mg | ORAL_TABLET | Freq: Every day | ORAL | 0 refills | Status: DC

## 2016-05-08 MED ORDER — IPRATROPIUM-ALBUTEROL 0.5-2.5 (3) MG/3ML IN SOLN: 3.0000 mL | Freq: Four times a day (QID) | RESPIRATORY_TRACT | Status: DC | PRN

## 2016-05-08 MED ORDER — ENSURE ENLIVE PO LIQD: 237.0000 mL | Freq: Two times a day (BID) | ORAL | Status: DC

## 2016-05-08 MED ORDER — ATORVASTATIN CALCIUM 10 MG PO TABS: 10.0000 mg | ORAL_TABLET | Freq: Every day | ORAL | Status: DC

## 2016-05-08 NOTE — Clinical Social Work Note (Signed)
Patient refused SNF placement at this time. Patient will go home at d/c. CM and MD notified. CSW signing off at this time.  [address] Heritage LaneBridget Mayton, ConnecticutLCSWA [phone]

## 2016-05-08 NOTE — Care Management CC44 (Signed)
Condition Code 44 Documentation Completed  Patient Details  Name: [name] Wilbon MRN: [id number] Date of Birth: 11-10-1940   Condition Code 44 given:   yes Patient signature on Condition Code 44 notice:   yes Documentation of 2 MD's agreement:   yes Code 44 added to claim:   yes    Darrold SpanWebster, Chau Savell Hall, RN 05/08/2016, 2:50 PM

## 2016-05-08 NOTE — Progress Notes (Signed)
  After patient's discharge summary had been done with plans for her to go to SNF on 05/08/16, patient apparently discussed with her granddaughter and daughter and decided that she did not want to go to SNF. Physical therapy evaluation from today appreciated. They indicated that she remains a high fall risk and is not safe with Rollator during gait. She has poor endurance and oxygen saturation decreased to 81% on 3 L. I discussed with patient's daughter and advised her that it may be better for patient to go for STR so as to improve her endurance & at the same time she could be closely monitored by medical personnel regarding her renal function and weaning off of oxygen. Daughter discussed with patient and both are agreeable to SNF. Unfortunately it is too late for her to leave today and has per Child psychotherapistsocial worker, she will be able to discharge on 05/09/16.  Marcellus ScottHONGALGI,ANAND, MD, FACP, FHM. [facility] Pager [phone]  If 7PM-7AM, please contact night-coverage [email or web address] Password [email or web address] 05/08/2016, 6:29 PM

## 2016-05-08 NOTE — Progress Notes (Signed)
SATURATION QUALIFICATIONS: (This note is used to comply with regulatory documentation for home oxygen)  Patient Saturations on Room Air at Rest = 87%  Patient Saturations on Room Air while Ambulating = 81%  Patient Saturations on 3 Liters of oxygen while Ambulating = 92%  Please briefly explain why patient needs home oxygen: Pt's oxygen saturation dropped to 81% while ambulating on room air and came up to 94% while ambulating with 3 liters.

## 2016-05-08 NOTE — Progress Notes (Signed)
Physical Therapy Treatment Patient Details Name: Lisa [name] MRN: [id number] DOB: 18-Feb-1941 Today's Date: 05/08/2016    History of Present Illness Pt is a 75 y.o female who presents with chest pain, SOB, decreased PO intake, and diarrhea. PMH includes CAD, HTN, chronic diastolic CHF.    PT Comments    Pt performed increased mobility and requiring decreased assist from previous session.  Pt appears motivated to return home but remains a high fall risk.  Pt is not safe with rollator during gait.  Pt remains to present with poor endurance and O2 sats decreased to 81% on 3L.  Will inform supervising PT of need for HHPT and ambulance transport for safe entry home.  Informed case manager of need for ambulance transport for entry into home.    Follow Up Recommendations  SNF;Supervision/Assistance - 24 hour     Equipment Recommendations   (Pt refusing SNF placement and will require HHPT, and ambulance transport for safe entry into home.  )    Recommendations for Other Services       Precautions / Restrictions Precautions Precautions: Fall Precaution Comments: Very SOB with mobility, SPO2 81% on 3L.   Restrictions Weight Bearing Restrictions: No    Mobility  Bed Mobility Overal bed mobility: Needs Assistance Bed Mobility: Supine to Sit     Supine to sit: Mod assist;HOB elevated (heavy reliance on bed rail.  )     General bed mobility comments: Pt required mod assist with her trunk to elevate into sitting.  Pt required mod assist with bed pad to scoot to edge of bed.    Transfers Overall transfer level: Needs assistance Equipment used: 4-wheeled walker Transfers: Sit to/from Stand Sit to Stand: Mod assist;+2 physical assistance         General transfer comment: Pt required cues for hand placement to push from seated surface, Pt able to recall needing to lock rollator before standing.  Pt presents with poor eccentric loading from stand to sit.  Pt with LOB x1 after  clearing bottom from bed.    Ambulation/Gait Ambulation/Gait assistance: Min assist Ambulation Distance (Feet): 15 Feet Assistive device: 4-wheeled walker Gait Pattern/deviations: Step-through pattern;Shuffle;Wide base of support;Decreased stride length   Gait velocity interpretation: Below normal speed for age/gender General Gait Details: Forward flexion of upper trunk with propping of elbows on rollator hand grips as patient begins to fatigue.  Pt required cues for upper trunk control and safety with rollator.  Pt remains a high fall risk due to poor safety.     Stairs            Wheelchair Mobility    Modified Rankin (Stroke Patients Only)       Balance Overall balance assessment: Needs assistance   Sitting balance-Leahy Scale: Fair       Standing balance-Leahy Scale: Poor Standing balance comment: Pt with heavy reliance of UE's and elbows on rollator hand grips.                      Cognition Arousal/Alertness: Awake/alert Behavior During Therapy: WFL for tasks assessed/performed Overall Cognitive Status: Within Functional Limits for tasks assessed                      Exercises      General Comments        Pertinent Vitals/Pain Pain Assessment: Faces Faces Pain Scale: Hurts even more Pain Location: L LE.   Pain Descriptors / Indicators: Burning Pain Intervention(s):  Repositioned    Home Living                      Prior Function            PT Goals (current goals can now be found in the care plan section) Acute Rehab PT Goals Patient Stated Goal: To go home with her grand daughter.   Potential to Achieve Goals: Good Progress towards PT goals: Progressing toward goals    Frequency    Min 2X/week      PT Plan Discharge plan needs to be updated    Co-evaluation             End of Session Equipment Utilized During Treatment: Gait belt Activity Tolerance: Treatment limited secondary to medical  complications (Comment) Patient left: with call bell/phone within reach;in chair     Time: [id number] PT Time Calculation (min) (ACUTE ONLY): 23 min  Charges:  $Gait Training: 8-22 mins $Therapeutic Activity: 8-22 mins                    G Codes:      Lisa [name] 05/08/2016, 3:25 PM  Lisa [name], PTA pager [phone]

## 2016-05-08 NOTE — Discharge Summary (Addendum)
Physician Discharge Summary  Lisa [name] GNF:[id number] DOB: 07-Dec-1940  PCP: Laurena Slimmer, MD  Admit date: 05/03/2016 Discharge date: 05/09/2016   Recommendations for Outpatient Follow-up:  1. MD at SNF in 2 days with repeat labs (CBC & BMP).Reassess volume status during follow-up at SNF to determine timing of resumption of Lasix. 2. Dr. Armanda Magic, Cardiology: MDs office will arrange outpatient stress test and follow-up. SNF to coordinate same. 3. Dr. Margaretmary Bayley, PCP: Upon discharge from SNF. 4. CPAP at bedtime: Auto mode. She can follow up as outpatient regarding this with Dr. Mayford Knife.  5. Patient was not on home oxygen prior to admission. Now on oxygen via nasal cannula at 4 L/m. Reassess at SNF and titrate/wean as tolerated.  Home Health: None Equipment/Devices: None    Discharge Condition: Improved and stable.  CODE STATUS: Full  Diet recommendation: Heart healthy and diabetic diet.  Discharge Diagnoses:  Principal Problem:   Chest pain, atypical Active Problems:   GERD (gastroesophageal reflux disease)   Hypertension   Diabetes mellitus (HCC)   Morbid obesity (HCC)   Hx of CABG   Chronic diastolic CHF (congestive heart failure) (HCC)   Persistent atrial fibrillation (HCC)   Nausea, vomiting and diarrhea   Abdominal pain   PAF- DDCV Jan 2017   Renal failure   Chest pain   Acute kidney injury superimposed on chronic kidney disease (HCC)   Brief/Interim Summary: 75 year old female with PMH of CAD, HTN, HLD, chronic diastolic CHF, osteoarthritis, persistent A. fib, OSA, DM not on insulin presented from Dr. Turner/cardiologist office to ED on 05/03/16 for evaluation of chest pain. Poor historian limited history taking. Chest pain described as worse with chest wall movements and deep inspiration. Associated couple days history of nausea, vomiting and diarrhea.  Cardiology consulted for chest pain. Now has acute kidney injury  Assessment & Plan:   1. Chest  pain: Seemed very atypical suspicious for musculoskeletal etiology and was worse with chest wall movements or deep inspiration and was reproducible to palpation. Does not even seem like GI in etiology. Troponin minimally elevated but flat trend. EKG in office nonischemic with chronic RBBB. 2-D echo results as below: Normal EF without wall motion abnormalities (cardiology plans outpatient stress test). Patient poor historian. D-dimer positive and hence CTA chest to rule out PE was done. CTA chest and non contrasted CT Abd: No PE & No acute abnormalities. Chest pain is significantly improved. May use Tylenol when necessary.  2. Nausea, vomiting and intermittent diarrhea: Unclear etiology. Denies sick contacts. Lipase normal. CT abdomen and pelvis negative. LFTs unremarkable. Symptoms resolved. 3. Hypokalemia/hyperkalemia: Magnesium normal. Potassium 5.1 (was 5.4 and improved after dose of Kayexalate). Potassium supplements held. Follow BMP in a couple days as outpatient. 4. Type II DM: Hemoglobin A1c 5.0. Diet controlled. 5. CAD status post remote CABG: Management as above. 6. Chronic diastolic CHF: Appears compensated. Lasix currently held secondary to poor oral intake and GI losses during initial part of hospitalization and subsequent acute kidney injury. Follow-up at SNF with repeat BMP and clinically to determine timing of resumption of Lasix. 7. Hyperlipidemia: Simvastatin changed to Lipitor since she is on amlodipine. 8. Essential hypertension: Controlled on beta blockers and amlodipine. 9. OSA: Has not been using her CPAP at home because she needs a new mask. Outpatient follow-up with Dr. Mayford Knife. 10. Persistent A. fib: Remains on Toprol and Apixaban. Rate controlled or SR-SB in 50's. 11. Recurrent falls. PT and OT evaluation. Patient going to SNF for rehabilitation. 12. Anemia:  Stable. Follow CBCs. Mild thrombocytopenia - resolved. 13. Acute on chronic Stage III chronic kidney disease/ contrast  nephropathy?: Patient presented with creatinine of 1.27 which peaked to 1.91 on 10/31, likely related to CT contrast. Creatinine has now improved to 1.45. [facility]. Baseline creatinine may be in the 1.1-1.2 range. Improved. Lasix has been held. Diuretics discontinued. Gabapentin dose was reduced adjusting for renal dysfunction. Renal ultrasound without hydronephrosis. 14. 2 right renal cysts measuring up to 5.8 cm by renal ultrasound: Outpatient follow-up as deemed necessary. 15. Acute hypoxic respiratory failure: Unclear etiology.? Related to chronic diastolic CHF, OSA & atelectasis. Oxygen at discharge and wean at SNF as tolerated.   Consultants:   Cardiology  Procedures:   2-D echo 05/05/16: Study Conclusions  - Left ventricle: The cavity size was normal. Wall thickness was increased in a pattern of moderate LVH. Systolic function was normal. The estimated ejection fraction was in the range of 50% to 55%. Wall motion was normal; there were no regional wall motion abnormalities. Doppler parameters are consistent with abnormal left ventricular relaxation (grade 1 diastolic dysfunction). - Mitral valve: Mildly calcified annulus. - Left atrium: The atrium was moderately dilated. - Tricuspid valve: There was mild-moderate regurgitation. - Pulmonary arteries: Systolic pressure was moderately increased. PA peak pressure: 53 mm Hg (S).   Discharge Instructions  Discharge Instructions    (HEART FAILURE PATIENTS) Call MD:  Anytime you have any of the following symptoms: 1) 3 pound weight gain in 24 hours or 5 pounds in 1 week 2) shortness of breath, with or without a dry hacking cough 3) swelling in the hands, feet or stomach 4) if you have to sleep on extra pillows at night in order to breathe.    Complete by:  As directed    Call MD for:  difficulty breathing, headache or visual disturbances    Complete by:  As directed    Call MD for:  extreme  fatigue    Complete by:  As directed    Call MD for:  persistant dizziness or light-headedness    Complete by:  As directed    Call MD for:  persistant nausea and vomiting    Complete by:  As directed    Call MD for:  severe uncontrolled pain    Complete by:  As directed    Diet - low sodium heart healthy    Complete by:  As directed    Diet Carb Modified    Complete by:  As directed    Increase activity slowly    Complete by:  As directed        Medication List    STOP taking these medications   diclofenac sodium 1 % Gel Commonly known as:  VOLTAREN   etodolac 400 MG tablet Commonly known as:  LODINE   furosemide 20 MG tablet Commonly known as:  LASIX   potassium chloride SA 20 MEQ tablet Commonly known as:  KLOR-CON M20     TAKE these medications   amLODipine 5 MG tablet Commonly known as:  NORVASC Take 1 tablet (5 mg total) by mouth daily.   apixaban 5 MG Tabs tablet Commonly known as:  ELIQUIS Take 1 tablet (5 mg total) by mouth 2 (two) times daily.   atorvastatin 10 MG tablet Commonly known as:  LIPITOR Take 1 tablet (10 mg total) by mouth daily at 6 PM.   buPROPion 150 MG 24 hr tablet Commonly known as:  WELLBUTRIN XL  Take 150 mg by mouth daily. Reported on 08/02/2015   esomeprazole 40 MG capsule Commonly known as:  NEXIUM Take 1 capsule (40 mg total) by mouth daily before breakfast.   gabapentin 600 MG tablet Commonly known as:  NEURONTIN Take 1 tablet (600 mg total) by mouth 2 (two) times daily. What changed:  when to take this   ipratropium-albuterol 0.5-2.5 (3) MG/3ML Soln Commonly known as:  DUONEB Take 3 mLs by nebulization every 6 (six) hours as needed. Wheezing or shortness of breath.   metoprolol succinate 25 MG 24 hr tablet Commonly known as:  TOPROL XL Take 1 tablet (25 mg total) by mouth daily.       Contact information for follow-up providers    Armanda Magic, MD .   Specialty:  Cardiology Why:  office will contact you Contact  information: [address] [place] [address] [phone]        Laurena Slimmer, MD Follow up today.   Specialty:  Internal Medicine Why:  Upon discharge from SNF. Contact information: [address] [place] [address] [phone]        [facility] .   Why:  HHRN/PT arranged- they will call you to set up home visits Contact information: [address] [place] [address] [phone]        Inc[facility] - Dme [facility] .   Why:  Home 02 arranged- to be delivered to home-  Contact information: [address] [place] [address] [phone]        M.D. at SNF. Schedule an appointment as soon as possible for a visit in 2 day(s).   Why:  To be seen with repeat labs (CBC & BMP). Reassess volume status to determine timing of resuming Lasix.           Contact information for after-discharge care    Destination    [facility] .   Specialty:  Skilled Nursing Facility Contact information: [address] [place] [address] [phone]                 No Known Allergies  Procedures/Studies: Dg Chest 2 View  Result Date: 05/05/2016 CLINICAL DATA:  Shortness of breath. EXAM: CHEST  2 VIEW COMPARISON:  May 04, 2016 FINDINGS: Linear density in the right mid lung may represent scar or atelectasis versus fluid in the minor fissure. Stable cardiomegaly. No pneumothorax. No overt edema. No other acute abnormalities. IMPRESSION: No acute interval change. Electronically Signed   By: Gerome Sam III M.D   On: 05/05/2016 18:19   Ct Angio Chest Pe W Or Wo Contrast  Result Date: 05/05/2016 CLINICAL DATA:  Lambert Mody mid chest pain. Elevated D-dimer. Nausea and vomiting. EXAM: CT ANGIOGRAPHY CHEST CT ABDOMEN AND PELVIS WITH CONTRAST TECHNIQUE: Multidetector CT imaging of the chest was performed using the standard protocol during bolus administration of  intravenous contrast. Multiplanar CT image reconstructions and MIPs were obtained to evaluate the vascular anatomy. Multidetector CT imaging of the abdomen and pelvis was performed using the standard protocol during bolus administration of intravenous contrast. CONTRAST:  80 cc of Isovue 370 COMPARISON:  August 28, 2009 FINDINGS: CTA CHEST FINDINGS Cardiovascular: The thoracic aorta demonstrates atherosclerotic change but no aneurysm or dissection. No pleural or pericardial effusions. Cardiomegaly is identified. There are coronary artery calcifications. Respiratory motion, particularly in the bases, limits evaluation of the pulmonary arteries. Taking respiratory motion into account, no convincing evidence of  pulmonary emboli identified. Mediastinum/Nodes: No adenopathy.  No effusions. Lungs/Pleura: The central airways are normal. No pneumothorax. Linear opacities in the lungs most consistent with scar or atelectasis. Respiratory motion limits evaluation, particularly of the lower lobes. No suspicious nodules or masses. No focal infiltrates. Musculoskeletal: Degenerative changes in the thoracic spine. No acute bony abnormalities. Previous sternotomy. Review of the MIP images confirms the above findings. CT ABDOMEN and PELVIS FINDINGS Hepatobiliary: Evaluation is limited due to respiratory motion. The patient is status post cholecystectomy. No suspicious focal masses identified. Limited views of the portal vein are unremarkable. Pancreas: Unremarkable. No pancreatic ductal dilatation or surrounding inflammatory changes. Spleen: Evaluation is limited due to respiratory motion. No abnormalities. Adrenals/Urinary Tract: Multiple cysts are associated with the right kidney. No suspicious masses. An extra renal pelvis is associated with the left kidney. No hydronephrosis or suspicious mass. No ureterectasis or ureteral stones. The adrenal glands are normal in appearance. Stomach/Bowel: The distal esophagus and stomach are  normal. The small bowel is unremarkable. A few scattered colonic diverticuli are seen without diverticulitis. Nondependent portions of colon are air-filled and mildly prominent but not dilated by CT criteria. No obstruction. No acute colonic abnormalities. The appendix is well seen on coronal images with no appendicitis. Vascular/Lymphatic: Atherosclerotic changes are seen in the non aneurysmal aorta. No dissection. Atherosclerotic changes extend into the iliac and femoral vessels. No adenopathy. Reproductive: The patient is status post hysterectomy. No suspicious adnexal masses. The ovaries appear to have been retained. Other: There is a fat containing umbilical hernia. Musculoskeletal: Postsurgical changes are seen in the lower lumbar spine. Degenerative change are identified. Review of the MIP images confirms the above findings. IMPRESSION: 1. No pulmonary emboli.  Scattered atelectasis in the lungs. 2. No acute abnormalities in the abdomen to explain the patient's symptoms. 3. Atherosclerotic change in the aorta, iliac vessels, and femoral vessels. Electronically Signed   By: Gerome Sam III M.D   On: 05/05/2016 17:15   Ct Abdomen Pelvis W Contrast  Result Date: 05/05/2016 CLINICAL DATA:  Lambert Mody mid chest pain. Elevated D-dimer. Nausea and vomiting. EXAM: CT ANGIOGRAPHY CHEST CT ABDOMEN AND PELVIS WITH CONTRAST TECHNIQUE: Multidetector CT imaging of the chest was performed using the standard protocol during bolus administration of intravenous contrast. Multiplanar CT image reconstructions and MIPs were obtained to evaluate the vascular anatomy. Multidetector CT imaging of the abdomen and pelvis was performed using the standard protocol during bolus administration of intravenous contrast. CONTRAST:  80 cc of Isovue 370 COMPARISON:  August 28, 2009 FINDINGS: CTA CHEST FINDINGS Cardiovascular: The thoracic aorta demonstrates atherosclerotic change but no aneurysm or dissection. No pleural or pericardial  effusions. Cardiomegaly is identified. There are coronary artery calcifications. Respiratory motion, particularly in the bases, limits evaluation of the pulmonary arteries. Taking respiratory motion into account, no convincing evidence of pulmonary emboli identified. Mediastinum/Nodes: No adenopathy.  No effusions. Lungs/Pleura: The central airways are normal. No pneumothorax. Linear opacities in the lungs most consistent with scar or atelectasis. Respiratory motion limits evaluation, particularly of the lower lobes. No suspicious nodules or masses. No focal infiltrates. Musculoskeletal: Degenerative changes in the thoracic spine. No acute bony abnormalities. Previous sternotomy. Review of the MIP images confirms the above findings. CT ABDOMEN and PELVIS FINDINGS Hepatobiliary: Evaluation is limited due to respiratory motion. The patient is status post cholecystectomy. No suspicious focal masses identified. Limited views of the portal vein are unremarkable. Pancreas: Unremarkable. No pancreatic ductal dilatation or surrounding inflammatory changes. Spleen: Evaluation is limited due to respiratory motion.  No abnormalities. Adrenals/Urinary Tract: Multiple cysts are associated with the right kidney. No suspicious masses. An extra renal pelvis is associated with the left kidney. No hydronephrosis or suspicious mass. No ureterectasis or ureteral stones. The adrenal glands are normal in appearance. Stomach/Bowel: The distal esophagus and stomach are normal. The small bowel is unremarkable. A few scattered colonic diverticuli are seen without diverticulitis. Nondependent portions of colon are air-filled and mildly prominent but not dilated by CT criteria. No obstruction. No acute colonic abnormalities. The appendix is well seen on coronal images with no appendicitis. Vascular/Lymphatic: Atherosclerotic changes are seen in the non aneurysmal aorta. No dissection. Atherosclerotic changes extend into the iliac and femoral  vessels. No adenopathy. Reproductive: The patient is status post hysterectomy. No suspicious adnexal masses. The ovaries appear to have been retained. Other: There is a fat containing umbilical hernia. Musculoskeletal: Postsurgical changes are seen in the lower lumbar spine. Degenerative change are identified. Review of the MIP images confirms the above findings. IMPRESSION: 1. No pulmonary emboli.  Scattered atelectasis in the lungs. 2. No acute abnormalities in the abdomen to explain the patient's symptoms. 3. Atherosclerotic change in the aorta, iliac vessels, and femoral vessels. Electronically Signed   By: Gerome Samavid  Williams III M.D   On: 05/05/2016 17:15   [place] Renal  Result Date: 05/07/2016 CLINICAL DATA:  [age] year old hypertensive diabetic female with renal failure. Subsequent encounter. EXAM: RENAL / URINARY TRACT ULTRASOUND COMPLETE COMPARISON:  05/05/2016 CT. FINDINGS: Right Kidney: Length: 10.3 cm. No hydronephrosis. Upper pole 2.6 cm cyst and 5.8 cm cyst mid aspect. Left Kidney: Length: 11 cm.  Extra renal pelvis without hydronephrosis. Bladder: Appears normal for degree of bladder distention. IMPRESSION: No hydronephrosis.  Extrarenal pelvis on the left. Two right renal cysts measuring up to 5.8 cm. Electronically Signed   By: Lacy DuverneySteven  Olson M.D.   On: 05/07/2016 13:35   Dg Chest Port 1 View  Result Date: 05/04/2016 CLINICAL DATA:  Chest pain, shortness of breath x2 months EXAM: PORTABLE CHEST 1 VIEW COMPARISON:  10/01/2009 FINDINGS: Low lung volumes. Platelike scarring/ atelectasis in the right lower lung. No pleural effusion or pneumothorax. Cardiomegaly.  Postsurgical changes related to prior CABG. IMPRESSION: Low lung volumes with platelike scarring/ atelectasis in the right lower lung. These results will be called to the ordering clinician or representative by the Radiologist Assistant, and communication documented in the PACS or zVision Dashboard. Electronically Signed   By: Charline BillsSriyesh  Krishnan  M.D.   On: 05/04/2016 18:36      Subjective: Patient states that her chest pain has significantly improved since admission. Denies dyspnea, cough and any other complaints. Ration's RN at bedside.  Discharge Exam:  Vitals:   05/08/16 2121 05/08/16 2131 05/09/16 0543 05/09/16 0936  BP:   (!) 139/49 (!) 129/58  Pulse:  60 (!) 54 60  Resp:  20 18   Temp:   98.3 F (36.8 C)   TempSrc:   Oral   SpO2: 96% 96% 100%   Weight:   116.1 kg (255 lb 14.4 oz)   Height:        General exam: Pleasant elderly female lying comfortably propped up in bed. Respiratory system: clear to auscultation. Respiratory effort normal. Reproducible chest wall tenderness-resolved. Cardiovascular system: S1 & S2 heard, RRR. No JVD, murmurs, rubs, gallops or clicks. No pedal edema. Telemetry: Afib Vs SR-SB in the 50's. Gastrointestinal system: Abdomen is nondistended, soft and nontender. No organomegaly or masses felt. Normal bowel sounds heard. Central nervous system: Alert and oriented.  No focal neurological deficits. Extremities: Symmetric 5 x 5 power. Skin: Examined with patient's female RN in room. She has a small single (<0.25 cm) blister over right upper buttock without acute findings. She also has same size superficial ulcer without acute findings over left buttock. Has protective dressing on top of both of these. Psychiatry: Judgement and insight appear impaired & affect appropriate.     The results of significant diagnostics from this hospitalization (including imaging, microbiology, ancillary and laboratory) are listed below for reference.     Microbiology: Recent Results (from the past 240 hour(s))  Urine culture     Status: Abnormal   Collection Time: 05/03/16  7:25 PM  Result Value Ref Range Status   Specimen Description URINE, RANDOM  Final   Special Requests NONE  Final   Culture MULTIPLE SPECIES PRESENT, SUGGEST RECOLLECTION (A)  Final   Report Status 05/05/2016 FINAL  Final      Labs: BNP (last 3 results)  Recent Labs  05/03/16 1859  BNP 257.2*   Basic Metabolic Panel:  Recent Labs Lab 05/03/16 1859  05/04/16 0219  05/05/16 0344 05/06/16 0240 05/07/16 0152 05/08/16 1031 05/09/16 0915  NA 138  < >  --   < > 136 131* 134* 135 134*  K 2.9*  < >  --   < > 4.8 5.4* 5.2* 5.1 5.1  CL 103  < >  --   < > 106 104 105 105 103  CO2 24  < >  --   < > 24 22 20* 24 25  GLUCOSE 76  < >  --   < > 131* 108* 139* 144* 113*  BUN 8  < >  --   < > 12 17 22* 22* 26*  CREATININE 1.27*  < >  --   < > 1.37* 1.76* 1.91* 1.45* 1.51*  CALCIUM 8.6*  < >  --   < > 8.6* 8.4* 8.7* 8.6* 8.5*  MG 1.4*  --  2.0  --   --   --   --   --   --   PHOS 3.0  --  3.3  --   --   --   --   --   --   < > = values in this interval not displayed. Liver Function Tests:  Recent Labs Lab 05/03/16 1859 05/03/16 2215 05/07/16 0152  AST 22 19 22   ALT 8* 8* 9*  ALKPHOS 43 41 46  BILITOT 1.3* 1.0 0.7  PROT 6.2* 5.6* 6.1*  ALBUMIN 2.8* 2.5* 2.4*    Recent Labs Lab 05/03/16 2215  LIPASE 21   No results for input(s): AMMONIA in the last 168 hours. CBC:  Recent Labs Lab 05/03/16 1859 05/04/16 0219 05/06/16 0240 05/09/16 0915  WBC 6.2 6.4 8.8 6.8  NEUTROABS 3.8 3.9  --   --   HGB 10.2* 9.6* 9.1* 9.5*  HCT 30.6* 28.9* 28.6* 29.1*  MCV 95.3 95.1 98.3 98.3  PLT 152 146* 164 196   Cardiac Enzymes:  Recent Labs Lab 05/03/16 1859 05/03/16 2215 05/04/16 0219  TROPONINI 0.04* 0.03* 0.04*   BNP: Invalid input(s): POCBNP CBG:  Recent Labs Lab 05/08/16 1128 05/08/16 1638 05/08/16 2031 05/09/16 0606 05/09/16 1114  GLUCAP 117* 90 126* 67 114*     Time coordinating discharge: Over 30 minutes  SIGNED:  Marcellus Scott, MD, FACP, FHM. [facility] Pager [phone]  If 7PM-7AM, please contact night-coverage [email or web address] Password [email or web address] 05/09/2016, 12:02 PM

## 2016-05-08 NOTE — Clinical Social Work Placement (Deleted)
   CLINICAL SOCIAL WORK PLACEMENT  NOTE  Date:  05/08/2016  Patient Details  Name: Lisa SchneidersGrace A Kupfer MRN: [id number] Date of Birth: 01-16-1941  Clinical Social Work is seeking post-discharge placement for this patient at the Skilled  Nursing Facility level of care (*CSW will initial, date and re-position this form in  chart as items are completed):  Yes   Patient/family provided with [facility] Clinical Social Work Department's list of facilities offering this level of care within the geographic area requested by the patient (or if unable, by the patient's family).  Yes   Patient/family informed of their freedom to choose among providers that offer the needed level of care, that participate in Medicare, Medicaid or managed care program needed by the patient, have an available bed and are willing to accept the patient.      Patient/family informed of [facility]'s ownership interest in [facility] and [facility], as well as of the fact that they are under no obligation to receive care at these facilities.  PASRR submitted to EDS on       PASRR number received on       Existing PASRR number confirmed on 05/06/16     FL2 transmitted to all facilities in geographic area requested by pt/family on 05/06/16     FL2 transmitted to all facilities within larger geographic area on 05/06/16     Patient informed that his/her managed care company has contracts with or will negotiate with certain facilities, including the following:        Yes   Patient/family informed of bed offers received.  [facility]     Physician recommends and patient chooses bed at      [facility].  Patient to be transferred to facility by [facility]     Patient family notified on 05/08/16 of transfer.  Name of family member notified:        PHYSICIAN Please prepare priority discharge summary, including medications, Please  prepare prescriptions     Additional Comment:    _______________________________________________ Jamey ReasBridget Mayton, LCSWA [phone]

## 2016-05-08 NOTE — Care Management Note (Signed)
Case Management Note Donn PieriniKristi Abraham Entwistle RN, BSN Unit 2W-Case Manager [phone]  Patient Details  Name: [name] Nakama MRN: [id number] Date of Birth: 03/19/1941  Subjective/Objective:  Pt admitted with c/p                  Action/Plan: PTA pt lived at home alone- recommendations for SNF- CSW working with pt for SNF placement  Expected Discharge Date:     05/08/16             Expected Discharge Plan:  Home w Home Health Services  In-House Referral:  Clinical Social Work  Discharge planning Services  CM Consult  Post Acute Care Choice:  Home Health, Durable Medical Equipment Choice offered to:  Adult Children, Patient  DME Arranged:  Oxygen DME Agency:  [facility]  HH Arranged:  RN, [facility]  [facility]  Status of Service:  Completed, signed off  If discussed at Long Length of Stay Meetings, dates discussed:    Discharge Disposition: home with home health   Additional Comments:  05/08/16- 1400- Donn PieriniKristi Sosaia Pittinger RN CM- plan for d/c today to SNF- CSW was following for placement needs- per CSW pt and family have now decided to take home despite recommendations for STSNF as safest d/c plan.- spoke with pt and daughter at bedside both have stated that plan is for pt to return home - plan is for granddaughter to stay with pt- per daughter- Janine LimboCheryl Barnes- pt has CPAP at home- but did not have home 02- will need to qualify pt for home 02 prior to discharge-Pt [facility] in the past- and pt will need HHRN/PT order- have paged MD regarding d/c plan and needs. PT to see if pt can ambulate and check 02 sats.  1600- [facility]- [facility], [facility]- will need to make sure home 02 is in the home prior to pt getting there- as pt is going home by ambulance.  1640- call received by MD- daughter now  agreeable pt to go to SNF and wants placement- have spoken with pt at bedside and pt now agreeable to go to SNF- CSW will work on placement in AM- have canceled referrals for home 02 and HH at this time.    Darrold SpanWebster, Vashon Riordan Hall, RN 05/08/2016, 4:14 PM

## 2016-05-09 DIAGNOSIS — R2689 Other abnormalities of gait and mobility: Secondary | ICD-10-CM | POA: Diagnosis not present

## 2016-05-09 DIAGNOSIS — R112 Nausea with vomiting, unspecified: Secondary | ICD-10-CM | POA: Diagnosis not present

## 2016-05-09 DIAGNOSIS — M25562 Pain in left knee: Secondary | ICD-10-CM | POA: Diagnosis not present

## 2016-05-09 DIAGNOSIS — E785 Hyperlipidemia, unspecified: Secondary | ICD-10-CM | POA: Diagnosis not present

## 2016-05-09 DIAGNOSIS — R609 Edema, unspecified: Secondary | ICD-10-CM | POA: Diagnosis not present

## 2016-05-09 DIAGNOSIS — K219 Gastro-esophageal reflux disease without esophagitis: Secondary | ICD-10-CM | POA: Diagnosis not present

## 2016-05-09 DIAGNOSIS — Z951 Presence of aortocoronary bypass graft: Secondary | ICD-10-CM | POA: Diagnosis not present

## 2016-05-09 DIAGNOSIS — G4733 Obstructive sleep apnea (adult) (pediatric): Secondary | ICD-10-CM | POA: Diagnosis not present

## 2016-05-09 DIAGNOSIS — I481 Persistent atrial fibrillation: Secondary | ICD-10-CM | POA: Diagnosis not present

## 2016-05-09 DIAGNOSIS — M1990 Unspecified osteoarthritis, unspecified site: Secondary | ICD-10-CM | POA: Diagnosis not present

## 2016-05-09 DIAGNOSIS — E1122 Type 2 diabetes mellitus with diabetic chronic kidney disease: Secondary | ICD-10-CM | POA: Diagnosis not present

## 2016-05-09 DIAGNOSIS — R197 Diarrhea, unspecified: Secondary | ICD-10-CM | POA: Diagnosis not present

## 2016-05-09 DIAGNOSIS — R52 Pain, unspecified: Secondary | ICD-10-CM | POA: Diagnosis not present

## 2016-05-09 DIAGNOSIS — I4510 Unspecified right bundle-branch block: Secondary | ICD-10-CM | POA: Diagnosis not present

## 2016-05-09 DIAGNOSIS — E875 Hyperkalemia: Secondary | ICD-10-CM | POA: Diagnosis not present

## 2016-05-09 DIAGNOSIS — R0789 Other chest pain: Secondary | ICD-10-CM | POA: Diagnosis not present

## 2016-05-09 DIAGNOSIS — Z23 Encounter for immunization: Secondary | ICD-10-CM | POA: Diagnosis not present

## 2016-05-09 DIAGNOSIS — I5032 Chronic diastolic (congestive) heart failure: Secondary | ICD-10-CM | POA: Diagnosis not present

## 2016-05-09 DIAGNOSIS — E119 Type 2 diabetes mellitus without complications: Secondary | ICD-10-CM | POA: Diagnosis not present

## 2016-05-09 DIAGNOSIS — I480 Paroxysmal atrial fibrillation: Secondary | ICD-10-CM | POA: Diagnosis not present

## 2016-05-09 DIAGNOSIS — I10 Essential (primary) hypertension: Secondary | ICD-10-CM | POA: Diagnosis not present

## 2016-05-09 DIAGNOSIS — R05 Cough: Secondary | ICD-10-CM | POA: Diagnosis not present

## 2016-05-09 DIAGNOSIS — M25511 Pain in right shoulder: Secondary | ICD-10-CM | POA: Diagnosis not present

## 2016-05-09 DIAGNOSIS — R296 Repeated falls: Secondary | ICD-10-CM | POA: Diagnosis not present

## 2016-05-09 DIAGNOSIS — N183 Chronic kidney disease, stage 3 (moderate): Secondary | ICD-10-CM | POA: Diagnosis not present

## 2016-05-09 DIAGNOSIS — I081 Rheumatic disorders of both mitral and tricuspid valves: Secondary | ICD-10-CM | POA: Diagnosis not present

## 2016-05-09 DIAGNOSIS — I130 Hypertensive heart and chronic kidney disease with heart failure and stage 1 through stage 4 chronic kidney disease, or unspecified chronic kidney disease: Secondary | ICD-10-CM | POA: Diagnosis not present

## 2016-05-09 DIAGNOSIS — M6281 Muscle weakness (generalized): Secondary | ICD-10-CM | POA: Diagnosis not present

## 2016-05-09 DIAGNOSIS — R0602 Shortness of breath: Secondary | ICD-10-CM | POA: Diagnosis not present

## 2016-05-09 DIAGNOSIS — D649 Anemia, unspecified: Secondary | ICD-10-CM | POA: Diagnosis not present

## 2016-05-09 DIAGNOSIS — D696 Thrombocytopenia, unspecified: Secondary | ICD-10-CM | POA: Diagnosis not present

## 2016-05-09 DIAGNOSIS — R062 Wheezing: Secondary | ICD-10-CM | POA: Diagnosis not present

## 2016-05-09 DIAGNOSIS — M1991 Primary osteoarthritis, unspecified site: Secondary | ICD-10-CM | POA: Diagnosis not present

## 2016-05-09 DIAGNOSIS — R079 Chest pain, unspecified: Secondary | ICD-10-CM | POA: Diagnosis not present

## 2016-05-09 DIAGNOSIS — D631 Anemia in chronic kidney disease: Secondary | ICD-10-CM | POA: Diagnosis not present

## 2016-05-09 DIAGNOSIS — I2510 Atherosclerotic heart disease of native coronary artery without angina pectoris: Secondary | ICD-10-CM | POA: Diagnosis not present

## 2016-05-09 DIAGNOSIS — J80 Acute respiratory distress syndrome: Secondary | ICD-10-CM | POA: Diagnosis not present

## 2016-05-09 DIAGNOSIS — E876 Hypokalemia: Secondary | ICD-10-CM | POA: Diagnosis not present

## 2016-05-09 DIAGNOSIS — M25561 Pain in right knee: Secondary | ICD-10-CM | POA: Diagnosis not present

## 2016-05-09 DIAGNOSIS — R0600 Dyspnea, unspecified: Secondary | ICD-10-CM | POA: Diagnosis not present

## 2016-05-09 DIAGNOSIS — H6121 Impacted cerumen, right ear: Secondary | ICD-10-CM | POA: Diagnosis not present

## 2016-05-09 DIAGNOSIS — R5381 Other malaise: Secondary | ICD-10-CM | POA: Diagnosis not present

## 2016-05-09 DIAGNOSIS — N281 Cyst of kidney, acquired: Secondary | ICD-10-CM | POA: Diagnosis not present

## 2016-05-09 DIAGNOSIS — J9601 Acute respiratory failure with hypoxia: Secondary | ICD-10-CM | POA: Diagnosis not present

## 2016-05-09 DIAGNOSIS — N179 Acute kidney failure, unspecified: Secondary | ICD-10-CM | POA: Diagnosis not present

## 2016-05-09 DIAGNOSIS — Z7901 Long term (current) use of anticoagulants: Secondary | ICD-10-CM | POA: Diagnosis not present

## 2016-05-09 LAB — CBC
HCT: 29.1 % — ABNORMAL LOW (ref 36.0–46.0)
Hemoglobin: 9.5 g/dL — ABNORMAL LOW (ref 12.0–15.0)
MCH: 32.1 pg (ref 26.0–34.0)
MCHC: 32.6 g/dL (ref 30.0–36.0)
MCV: 98.3 fL (ref 78.0–100.0)
PLATELETS: 196 10*3/uL (ref 150–400)
RBC: 2.96 MIL/uL — ABNORMAL LOW (ref 3.87–5.11)
RDW: 13.2 % (ref 11.5–15.5)
WBC: 6.8 10*3/uL (ref 4.0–10.5)

## 2016-05-09 LAB — GLUCOSE, CAPILLARY
GLUCOSE-CAPILLARY: 114 mg/dL — AB (ref 65–99)
Glucose-Capillary: 67 mg/dL (ref 65–99)

## 2016-05-09 LAB — BASIC METABOLIC PANEL
Anion gap: 6 (ref 5–15)
BUN: 26 mg/dL — AB (ref 6–20)
CO2: 25 mmol/L (ref 22–32)
CREATININE: 1.51 mg/dL — AB (ref 0.44–1.00)
Calcium: 8.5 mg/dL — ABNORMAL LOW (ref 8.9–10.3)
Chloride: 103 mmol/L (ref 101–111)
GFR calc Af Amer: 37 mL/min — ABNORMAL LOW (ref >60)
GFR, EST NON AFRICAN AMERICAN: 32 mL/min — AB (ref >60)
GLUCOSE: 113 mg/dL — AB (ref 65–99)
POTASSIUM: 5.1 mmol/L (ref 3.5–5.1)
SODIUM: 134 mmol/L — AB (ref 135–145)

## 2016-05-09 NOTE — Progress Notes (Signed)
Physical Therapy Treatment Patient Details Name: Lisa [name] MRN: [id number] DOB: 1941/01/21 Today's Date: 05/09/2016    History of Present Illness Pt is a 75 y.o female who presents with chest pain, SOB, decreased PO intake, and diarrhea. PMH includes CAD, HTN, chronic diastolic CHF.    PT Comments    Pt's O2 sats remain to decrease with mobility see O2 saturation note.  Pt continue to require +2 assist for mobility.  Continue to recommend SNF placement at d/c.    Follow Up Recommendations  SNF;Supervision/Assistance - 24 hour     Equipment Recommendations   (Pt remains unsure of discharge plan.  If she d/c home she will continure to need ambulance transport.)    Recommendations for Other Services       Precautions / Restrictions Precautions Precautions: Fall Precaution Comments: Very SOB with mobility, SPO2 81% on 3L.   Restrictions Weight Bearing Restrictions: No    Mobility  Bed Mobility Overal bed mobility: Needs Assistance Bed Mobility: Supine to Sit Rolling: Mod assist   Supine to sit: Mod assist;HOB elevated (heavy use of rails) Sit to supine: Mod assist;+2 for safety/equipment   General bed mobility comments: Pt required mod assist with her trunk to elevate into sitting.  Pt required mod assist with bed pad to scoot to edge of bed.    Transfers Overall transfer level: Needs assistance Equipment used: Rolling walker (2 wheeled) Transfers: Sit to/from Stand Sit to Stand: Mod assist;+2 safety/equipment         General transfer comment: Pt required cues for hand placement to push from seated surface.  Pt with LOB x 2 after clearing bottom from bed.    Ambulation/Gait Ambulation/Gait assistance: Mod assist Ambulation Distance (Feet): 25 Feet Assistive device: Rolling walker (2 wheeled) Gait Pattern/deviations: Step-through pattern;Decreased stride length;Trunk flexed;Wide base of support     General Gait Details: Cues required for upright position  and hand placement.  Assistance required for trunk control and balance.   Stairs            Wheelchair Mobility    Modified Rankin (Stroke Patients Only)       Balance     Sitting balance-Leahy Scale: Fair Sitting balance - Comments: Even with BUE support, pt had difficulty maintaining upright posture.    Standing balance support: Bilateral upper extremity supported Standing balance-Leahy Scale: Poor                      Cognition Arousal/Alertness: Awake/alert Behavior During Therapy: WFL for tasks assessed/performed Overall Cognitive Status: Within Functional Limits for tasks assessed                      Exercises Other Exercises Other Exercises: Spirometer x 7, achieved 1000 ml    General Comments        Pertinent Vitals/Pain Pain Assessment: Faces Faces Pain Scale: No hurt    Home Living                      Prior Function            PT Goals (current goals can now be found in the care plan section) Acute Rehab PT Goals Patient Stated Goal: To go home with her grand daughter.   PT Goal Formulation: With patient/family Time For Goal Achievement: 05/19/16 Potential to Achieve Goals: Good Progress towards PT goals: Progressing toward goals    Frequency    Min 2X/week  PT Plan Current plan remains appropriate    Co-evaluation             End of Session Equipment Utilized During Treatment: Gait belt;Oxygen Activity Tolerance: Treatment limited secondary to medical complications (Comment) Patient left: in chair;with call bell/phone within reach     Time: 0944-1030 PT Time Calculation (min) (ACUTE ONLY): 46 min  Charges:  $Gait Training: 8-22 mins $Therapeutic Activity: 23-37 mins                    G Codes:      Florestine Aversimee J Tabbitha Janvrin 05/09/2016, 2:25 PM    Cloyd Stagersobbie Lafar, SPTA present during tx.  Joycelyn RuaAimee Blandina Renaldo, PTA pager [phone]

## 2016-05-09 NOTE — Clinical Social Work Note (Signed)
Clinical Social Worker facilitated patient discharge including contacting patient family and facility to confirm patient discharge plans.  Clinical information faxed to facility and family agreeable with plan.  CSW arranged ambulance transport via [facility] to [facility].  RN to call report prior to discharge.  Clinical Social Worker will sign off for now as social work intervention is no longer needed. Please consult us again if new need arises.  Ikea Demicco [place], LCSWA [phone]  

## 2016-05-09 NOTE — Clinical Social Work Note (Addendum)
Per last conversation between CSW and patient patient refused SNF placement. CM was notified, spoke with patient and began organizing home health services. Per MD note patient is now agreeable to SNF. CSW will speak with patient this AM. [facility] is [facility] for patient. CSW will set up [facility] this AM.   Jamey ReasBridget Mayton, LCSWA [phone]

## 2016-05-09 NOTE — Clinical Social Work Placement (Signed)
   CLINICAL SOCIAL WORK PLACEMENT  NOTE  Date:  05/09/2016  Patient Details  Name: Lisa [name] MRN: [id number] Date of Birth: 1940/11/20  Clinical Social Work is seeking post-discharge placement for this patient at the Skilled  Nursing Facility level of care (*CSW will initial, date and re-position this form in  chart as items are completed):  Yes   Patient/family provided with [facility] Clinical Social Work Department's list of facilities offering this level of care within the geographic area requested by the patient (or if unable, by the patient's family).  Yes   Patient/family informed of their freedom to choose among providers that offer the needed level of care, that participate in Medicare, Medicaid or managed care program needed by the patient, have an available bed and are willing to accept the patient.      Patient/family informed of [facility]'s ownership interest in [facility] and [facility], as well as of the fact that they are under no obligation to receive care at these facilities.  PASRR submitted to EDS on       PASRR number received on       Existing PASRR number confirmed on 05/06/16     FL2 transmitted to all facilities in geographic area requested by pt/family on 05/06/16     FL2 transmitted to all facilities within larger geographic area on 05/06/16     Patient informed that his/her managed care company has contracts with or will negotiate with certain facilities, including the following:        Yes   Patient/family informed of bed offers received.  [facility]     Physician recommends and patient chooses bed at      [facility].  Patient to be transferred to facility by [facility]     Patient family notified on 05/09/16 of transfer.  Name of family member notified:        PHYSICIAN Please prepare priority discharge summary, including medications, Please  prepare prescriptions     Additional Comment:    _______________________________________________ Jamey ReasBridget Mayton, LCSWA [phone]

## 2016-05-09 NOTE — Clinical Social Work Note (Signed)
CSW spoke with patient early this AM and patient is again refusing SNF. CSW staffed with MD. CSW spoke with pt and pt requesting MD speak with granddaughter to explain patients current condition. Per nurse on 11/1 pt's granddaughter spoke with pt on the phone and granddaughter does not want patient to go to SNF and patient has refused since. Patient reports today to CSW that she believes it would be best if she goes to SNF but she wants MD to talk to her granddaughter and explain her condition so granddaughter won't be "mad". CSW relayed this to MD. MD will reach out to granddaughter. CSW will continue to follow.  [address]Bridget [name], [place]CSWA [phone]

## 2016-05-09 NOTE — Clinical Social Work Note (Signed)
[facility] CSW has called [facility] at this time and has set up transportation at 1:00. CSW has spoken with nurse, nurse to page MD. CSW signing off at this time.   [address] Vermeer [place], [place]WA [phone]

## 2016-05-09 NOTE — Progress Notes (Signed)
[facility] Patient taken by [facility]. IV's were d'cd and were intact. The patient's walker was left here due to the ambulance not being permitted to take it with them. A call was placed to the daughter's home and the RN spoke to FijiShana the granddaughter to let her know that the walker was here and would need to be picked up.

## 2016-05-09 NOTE — Progress Notes (Signed)
Per the request of the patient, I called the granddaughter to explain the situation with her grandmother. The granddaughter stated that she didn't understood the current situation and how much the patient would benefit from rehab. She then stated that she understood the need and would do what her grandmother wanted to do. The patient stated that she wanted to go to rehab.

## 2016-05-09 NOTE — Progress Notes (Addendum)
Patient interviewed and examined this morning with her RN and physical therapy team in the room. After going back and forth yesterday, patient had finely decided to go to SNF but was unable to go yesterday because of delay in her decision making. Social work reported this morning the patient again was refusing to go to SNF.  Patient denied any complaints. Denied chest pain or dyspnea. Vital signs stable. Telemetry shows paroxysmal A. fib, at times sinus bradycardia in the 50s-40s with BBB morphology. Patient lying comfortably supine in bed. Respiratory system: Slightly diminished breath sounds in the bases without wheezing, rhonchi or crackles. Rest of lung fields clear to auscultation. Cardiovascular system: S1 and S2 heard, irregularly irregular. No JVD. No pedal edema. CNS: Alert and oriented 3.  Lab results reviewed. Hemoglobin stable. Creatinine 1.51 (1.45 yesterday).  Assessment and plan: 1. Atypical chest pain: Suspicious for muscular etiology. Resolved. Cardiology will follow up as outpatient with outpatient stress test. 2. Acute hypoxic respiratory failure on? Chronic respiratory failure: PT has ambulated and recommend 4 L/m oxygen via nasal cannula. This can be weaned/titrated as needed at [facility]. Likely multifactorial. PT will educate patient regarding use of incentive spirometry.  3. 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 Patient appears clinically euvolemic. Continue to hold diuretics. Follow BMP at SNF to determine timing of resumption of Lasix.  4. Other multiple medical problems as per discharge summary done on 05/08/16  5. Disposition: I advised patient that based on her hospitalization, multiple comorbidities, deconditioning, physical therapy evaluation, it would be best for her to go for short-term SNF rehabilitation prior to returning home with her family. Patient has capacity to make medical decisions but seems to be torn b/w multiple family members  giving her conflict in advice. Clinical social worker finally communicated to advise that patient and her family are now agreeable to voluntarily go to SNF for rehabilitation.  Marcellus ScottHONGALGI,Emmalena Canny, MD, FACP, FHM. [facility] Pager [phone]  If 7PM-7AM, please contact night-coverage [email or web address] Password [email or web address] 05/09/2016, 11:59 AM

## 2016-05-09 NOTE — Progress Notes (Signed)
SATURATION QUALIFICATIONS: (This note is used to comply with regulatory documentation for home oxygen)  Patient Saturations on Room Air at Rest = 78%  Patient Saturations on Room Air while Ambulating = unable to ambulate on RA as O2 sats dropped to 78% at rest.    Patient Saturations on 4 Liters of oxygen while Ambulating = 94%  Please briefly explain why patient needs home oxygen:Pt desaturates on RA and at baseline at home with no supplemental O2.  Pt will require 02 at d/c to maintain and improve O2 saturations.    Joycelyn RuaAimee Ciel Chervenak, PTA pager [phone]

## 2016-05-10 DIAGNOSIS — R0600 Dyspnea, unspecified: Secondary | ICD-10-CM | POA: Diagnosis not present

## 2016-05-10 DIAGNOSIS — I5032 Chronic diastolic (congestive) heart failure: Secondary | ICD-10-CM | POA: Diagnosis not present

## 2016-05-10 DIAGNOSIS — R05 Cough: Secondary | ICD-10-CM | POA: Diagnosis not present

## 2016-05-13 ENCOUNTER — Telehealth: Payer: Self-pay

## 2016-05-13 DIAGNOSIS — R079 Chest pain, unspecified: Secondary | ICD-10-CM

## 2016-05-13 NOTE — Telephone Encounter (Signed)
  Message Contents  Quintella Reichertraci R Turner, MD  Henrietta DineKathryn A Kemp, RN        Please order 2 day lexiscan myoview for CP   Armanda Magicraci TUrner, MD   Previous Messages    ----- Message -----  From: Elease EtienneAnand D Hongalgi, MD  Sent: 05/09/2016 12:02 PM  To: Quintella Reichertraci R Turner, MD, Laurena SlimmerPreston S Clark, MD  Subject: Inpatient Notes           Patient states she is in rehab and to call back at the end of the week to discuss stress test instructions and to schedule.

## 2016-05-14 DIAGNOSIS — R062 Wheezing: Secondary | ICD-10-CM | POA: Diagnosis not present

## 2016-05-14 DIAGNOSIS — R0602 Shortness of breath: Secondary | ICD-10-CM | POA: Diagnosis not present

## 2016-05-14 DIAGNOSIS — I5032 Chronic diastolic (congestive) heart failure: Secondary | ICD-10-CM | POA: Diagnosis not present

## 2016-05-16 DIAGNOSIS — E1122 Type 2 diabetes mellitus with diabetic chronic kidney disease: Secondary | ICD-10-CM | POA: Diagnosis not present

## 2016-05-16 DIAGNOSIS — I10 Essential (primary) hypertension: Secondary | ICD-10-CM | POA: Diagnosis not present

## 2016-05-16 DIAGNOSIS — I5032 Chronic diastolic (congestive) heart failure: Secondary | ICD-10-CM | POA: Diagnosis not present

## 2016-05-16 DIAGNOSIS — I2510 Atherosclerotic heart disease of native coronary artery without angina pectoris: Secondary | ICD-10-CM | POA: Diagnosis not present

## 2016-05-17 DIAGNOSIS — R5381 Other malaise: Secondary | ICD-10-CM | POA: Diagnosis not present

## 2016-05-17 DIAGNOSIS — R609 Edema, unspecified: Secondary | ICD-10-CM | POA: Diagnosis not present

## 2016-05-17 DIAGNOSIS — I5032 Chronic diastolic (congestive) heart failure: Secondary | ICD-10-CM | POA: Diagnosis not present

## 2016-05-21 DIAGNOSIS — I5032 Chronic diastolic (congestive) heart failure: Secondary | ICD-10-CM | POA: Diagnosis not present

## 2016-05-21 DIAGNOSIS — R0602 Shortness of breath: Secondary | ICD-10-CM | POA: Diagnosis not present

## 2016-05-21 DIAGNOSIS — R609 Edema, unspecified: Secondary | ICD-10-CM | POA: Diagnosis not present

## 2016-05-21 NOTE — Telephone Encounter (Signed)
Left message to call back  

## 2016-05-24 DIAGNOSIS — M1991 Primary osteoarthritis, unspecified site: Secondary | ICD-10-CM | POA: Diagnosis not present

## 2016-05-24 DIAGNOSIS — M25562 Pain in left knee: Secondary | ICD-10-CM | POA: Diagnosis not present

## 2016-05-24 DIAGNOSIS — M25561 Pain in right knee: Secondary | ICD-10-CM | POA: Diagnosis not present

## 2016-05-24 DIAGNOSIS — M25511 Pain in right shoulder: Secondary | ICD-10-CM | POA: Diagnosis not present

## 2016-05-24 DIAGNOSIS — R609 Edema, unspecified: Secondary | ICD-10-CM | POA: Diagnosis not present

## 2016-05-28 DIAGNOSIS — R52 Pain, unspecified: Secondary | ICD-10-CM | POA: Diagnosis not present

## 2016-05-28 DIAGNOSIS — H6121 Impacted cerumen, right ear: Secondary | ICD-10-CM | POA: Diagnosis not present

## 2016-05-28 NOTE — Telephone Encounter (Signed)
Patient agrees to [employer] myoview. She understands to fast for 2 hours prior to the test, to avoid caffeine and decaf products for 12 hours prior to the test, and to wear close-toed shoes. She was grateful for call.

## 2016-06-01 DIAGNOSIS — M1990 Unspecified osteoarthritis, unspecified site: Secondary | ICD-10-CM | POA: Diagnosis not present

## 2016-06-01 DIAGNOSIS — N183 Chronic kidney disease, stage 3 (moderate): Secondary | ICD-10-CM | POA: Diagnosis not present

## 2016-06-01 DIAGNOSIS — E7800 Pure hypercholesterolemia, unspecified: Secondary | ICD-10-CM | POA: Diagnosis not present

## 2016-06-01 DIAGNOSIS — I2510 Atherosclerotic heart disease of native coronary artery without angina pectoris: Secondary | ICD-10-CM | POA: Diagnosis not present

## 2016-06-01 DIAGNOSIS — E1122 Type 2 diabetes mellitus with diabetic chronic kidney disease: Secondary | ICD-10-CM | POA: Diagnosis not present

## 2016-06-01 DIAGNOSIS — Z7901 Long term (current) use of anticoagulants: Secondary | ICD-10-CM | POA: Diagnosis not present

## 2016-06-01 DIAGNOSIS — D631 Anemia in chronic kidney disease: Secondary | ICD-10-CM | POA: Diagnosis not present

## 2016-06-01 DIAGNOSIS — Z951 Presence of aortocoronary bypass graft: Secondary | ICD-10-CM | POA: Diagnosis not present

## 2016-06-01 DIAGNOSIS — I130 Hypertensive heart and chronic kidney disease with heart failure and stage 1 through stage 4 chronic kidney disease, or unspecified chronic kidney disease: Secondary | ICD-10-CM | POA: Diagnosis not present

## 2016-06-01 DIAGNOSIS — I5032 Chronic diastolic (congestive) heart failure: Secondary | ICD-10-CM | POA: Diagnosis not present

## 2016-06-01 DIAGNOSIS — Z9981 Dependence on supplemental oxygen: Secondary | ICD-10-CM | POA: Diagnosis not present

## 2016-06-01 DIAGNOSIS — Z993 Dependence on wheelchair: Secondary | ICD-10-CM | POA: Diagnosis not present

## 2016-06-01 DIAGNOSIS — I481 Persistent atrial fibrillation: Secondary | ICD-10-CM | POA: Diagnosis not present

## 2016-06-02 ENCOUNTER — Inpatient Hospital Stay (HOSPITAL_COMMUNITY)
Admission: EM | Admit: 2016-06-02 | Discharge: 2016-06-13 | DRG: 291 | Disposition: A | Discharge status: Skilled Nursing Facility | Payer: Medicare Other | Attending: Family Medicine | Admitting: Family Medicine

## 2016-06-02 ENCOUNTER — Encounter (HOSPITAL_COMMUNITY): Payer: Self-pay | Admitting: Emergency Medicine

## 2016-06-02 ENCOUNTER — Emergency Department (HOSPITAL_COMMUNITY): Payer: Medicare Other

## 2016-06-02 DIAGNOSIS — G9340 Encephalopathy, unspecified: Secondary | ICD-10-CM | POA: Diagnosis present

## 2016-06-02 DIAGNOSIS — N189 Chronic kidney disease, unspecified: Secondary | ICD-10-CM | POA: Diagnosis not present

## 2016-06-02 DIAGNOSIS — R32 Unspecified urinary incontinence: Secondary | ICD-10-CM | POA: Diagnosis not present

## 2016-06-02 DIAGNOSIS — R21 Rash and other nonspecific skin eruption: Secondary | ICD-10-CM | POA: Diagnosis not present

## 2016-06-02 DIAGNOSIS — I5032 Chronic diastolic (congestive) heart failure: Secondary | ICD-10-CM | POA: Diagnosis not present

## 2016-06-02 DIAGNOSIS — R627 Adult failure to thrive: Secondary | ICD-10-CM | POA: Diagnosis not present

## 2016-06-02 DIAGNOSIS — J9621 Acute and chronic respiratory failure with hypoxia: Secondary | ICD-10-CM | POA: Diagnosis present

## 2016-06-02 DIAGNOSIS — K219 Gastro-esophageal reflux disease without esophagitis: Secondary | ICD-10-CM | POA: Diagnosis present

## 2016-06-02 DIAGNOSIS — Z9981 Dependence on supplemental oxygen: Secondary | ICD-10-CM | POA: Diagnosis not present

## 2016-06-02 DIAGNOSIS — E1140 Type 2 diabetes mellitus with diabetic neuropathy, unspecified: Secondary | ICD-10-CM | POA: Diagnosis present

## 2016-06-02 DIAGNOSIS — N179 Acute kidney failure, unspecified: Secondary | ICD-10-CM | POA: Diagnosis present

## 2016-06-02 DIAGNOSIS — I4819 Other persistent atrial fibrillation: Secondary | ICD-10-CM | POA: Diagnosis present

## 2016-06-02 DIAGNOSIS — D638 Anemia in other chronic diseases classified elsewhere: Secondary | ICD-10-CM | POA: Diagnosis present

## 2016-06-02 DIAGNOSIS — I130 Hypertensive heart and chronic kidney disease with heart failure and stage 1 through stage 4 chronic kidney disease, or unspecified chronic kidney disease: Principal | ICD-10-CM | POA: Diagnosis present

## 2016-06-02 DIAGNOSIS — M7989 Other specified soft tissue disorders: Secondary | ICD-10-CM | POA: Diagnosis present

## 2016-06-02 DIAGNOSIS — I5033 Acute on chronic diastolic (congestive) heart failure: Secondary | ICD-10-CM | POA: Diagnosis present

## 2016-06-02 DIAGNOSIS — N289 Disorder of kidney and ureter, unspecified: Secondary | ICD-10-CM | POA: Diagnosis not present

## 2016-06-02 DIAGNOSIS — J9811 Atelectasis: Secondary | ICD-10-CM | POA: Diagnosis not present

## 2016-06-02 DIAGNOSIS — E785 Hyperlipidemia, unspecified: Secondary | ICD-10-CM | POA: Diagnosis present

## 2016-06-02 DIAGNOSIS — L8990 Pressure ulcer of unspecified site, unspecified stage: Secondary | ICD-10-CM | POA: Diagnosis present

## 2016-06-02 DIAGNOSIS — Z87891 Personal history of nicotine dependence: Secondary | ICD-10-CM

## 2016-06-02 DIAGNOSIS — E1122 Type 2 diabetes mellitus with diabetic chronic kidney disease: Secondary | ICD-10-CM | POA: Diagnosis not present

## 2016-06-02 DIAGNOSIS — G4733 Obstructive sleep apnea (adult) (pediatric): Secondary | ICD-10-CM | POA: Diagnosis present

## 2016-06-02 DIAGNOSIS — E662 Morbid (severe) obesity with alveolar hypoventilation: Secondary | ICD-10-CM | POA: Diagnosis present

## 2016-06-02 DIAGNOSIS — R69 Illness, unspecified: Secondary | ICD-10-CM

## 2016-06-02 DIAGNOSIS — Z7901 Long term (current) use of anticoagulants: Secondary | ICD-10-CM

## 2016-06-02 DIAGNOSIS — N183 Chronic kidney disease, stage 3 (moderate): Secondary | ICD-10-CM | POA: Diagnosis present

## 2016-06-02 DIAGNOSIS — Z515 Encounter for palliative care: Secondary | ICD-10-CM

## 2016-06-02 DIAGNOSIS — I481 Persistent atrial fibrillation: Secondary | ICD-10-CM | POA: Diagnosis present

## 2016-06-02 DIAGNOSIS — M79609 Pain in unspecified limb: Secondary | ICD-10-CM | POA: Diagnosis not present

## 2016-06-02 DIAGNOSIS — I2510 Atherosclerotic heart disease of native coronary artery without angina pectoris: Secondary | ICD-10-CM | POA: Diagnosis not present

## 2016-06-02 DIAGNOSIS — D508 Other iron deficiency anemias: Secondary | ICD-10-CM | POA: Diagnosis not present

## 2016-06-02 DIAGNOSIS — I482 Chronic atrial fibrillation, unspecified: Secondary | ICD-10-CM | POA: Diagnosis present

## 2016-06-02 DIAGNOSIS — Z6841 Body Mass Index (BMI) 40.0 and over, adult: Secondary | ICD-10-CM

## 2016-06-02 DIAGNOSIS — M1990 Unspecified osteoarthritis, unspecified site: Secondary | ICD-10-CM | POA: Diagnosis present

## 2016-06-02 DIAGNOSIS — E0800 Diabetes mellitus due to underlying condition with hyperosmolarity without nonketotic hyperglycemic-hyperosmolar coma (NKHHC): Secondary | ICD-10-CM

## 2016-06-02 DIAGNOSIS — R531 Weakness: Secondary | ICD-10-CM | POA: Diagnosis not present

## 2016-06-02 DIAGNOSIS — R404 Transient alteration of awareness: Secondary | ICD-10-CM | POA: Diagnosis not present

## 2016-06-02 DIAGNOSIS — Z951 Presence of aortocoronary bypass graft: Secondary | ICD-10-CM

## 2016-06-02 DIAGNOSIS — A419 Sepsis, unspecified organism: Secondary | ICD-10-CM | POA: Diagnosis not present

## 2016-06-02 DIAGNOSIS — Z79899 Other long term (current) drug therapy: Secondary | ICD-10-CM

## 2016-06-02 DIAGNOSIS — I480 Paroxysmal atrial fibrillation: Secondary | ICD-10-CM | POA: Diagnosis not present

## 2016-06-02 DIAGNOSIS — R4182 Altered mental status, unspecified: Secondary | ICD-10-CM | POA: Diagnosis not present

## 2016-06-02 DIAGNOSIS — J111 Influenza due to unidentified influenza virus with other respiratory manifestations: Secondary | ICD-10-CM | POA: Diagnosis present

## 2016-06-02 DIAGNOSIS — Z955 Presence of coronary angioplasty implant and graft: Secondary | ICD-10-CM

## 2016-06-02 DIAGNOSIS — I509 Heart failure, unspecified: Secondary | ICD-10-CM

## 2016-06-02 DIAGNOSIS — I10 Essential (primary) hypertension: Secondary | ICD-10-CM | POA: Diagnosis not present

## 2016-06-02 DIAGNOSIS — E876 Hypokalemia: Secondary | ICD-10-CM | POA: Diagnosis not present

## 2016-06-02 DIAGNOSIS — Z7189 Other specified counseling: Secondary | ICD-10-CM | POA: Diagnosis not present

## 2016-06-02 LAB — COMPREHENSIVE METABOLIC PANEL
ALT: 13 U/L — AB (ref 14–54)
AST: 23 U/L (ref 15–41)
Albumin: 2.6 g/dL — ABNORMAL LOW (ref 3.5–5.0)
Alkaline Phosphatase: 102 U/L (ref 38–126)
Anion gap: 12 (ref 5–15)
BILIRUBIN TOTAL: 1.3 mg/dL — AB (ref 0.3–1.2)
BUN: 20 mg/dL (ref 6–20)
CALCIUM: 9 mg/dL (ref 8.9–10.3)
CO2: 24 mmol/L (ref 22–32)
CREATININE: 1.63 mg/dL — AB (ref 0.44–1.00)
Chloride: 100 mmol/L — ABNORMAL LOW (ref 101–111)
GFR calc Af Amer: 34 mL/min — ABNORMAL LOW (ref >60)
GFR, EST NON AFRICAN AMERICAN: 29 mL/min — AB (ref >60)
Glucose, Bld: 113 mg/dL — ABNORMAL HIGH (ref 65–99)
POTASSIUM: 4.1 mmol/L (ref 3.5–5.1)
Sodium: 136 mmol/L (ref 135–145)
TOTAL PROTEIN: 6.8 g/dL (ref 6.5–8.1)

## 2016-06-02 LAB — I-STAT CG4 LACTIC ACID, ED: Lactic Acid, Venous: 1.38 mmol/L (ref 0.5–1.9)

## 2016-06-02 LAB — CBC WITH DIFFERENTIAL/PLATELET
BASOS ABS: 0.1 10*3/uL (ref 0.0–0.1)
BASOS PCT: 1 %
EOS ABS: 0.1 10*3/uL (ref 0.0–0.7)
EOS PCT: 1 %
HCT: 27.8 % — ABNORMAL LOW (ref 36.0–46.0)
Hemoglobin: 9.1 g/dL — ABNORMAL LOW (ref 12.0–15.0)
Lymphocytes Relative: 12 %
Lymphs Abs: 1.3 10*3/uL (ref 0.7–4.0)
MCH: 31.5 pg (ref 26.0–34.0)
MCHC: 32.7 g/dL (ref 30.0–36.0)
MCV: 96.2 fL (ref 78.0–100.0)
Monocytes Absolute: 1 10*3/uL (ref 0.1–1.0)
Monocytes Relative: 10 %
Neutro Abs: 8.2 10*3/uL — ABNORMAL HIGH (ref 1.7–7.7)
Neutrophils Relative %: 76 %
PLATELETS: 174 10*3/uL (ref 150–400)
RBC: 2.89 MIL/uL — AB (ref 3.87–5.11)
RDW: 13.5 % (ref 11.5–15.5)
WBC: 10.6 10*3/uL — AB (ref 4.0–10.5)

## 2016-06-02 LAB — URINALYSIS, ROUTINE W REFLEX MICROSCOPIC
Bilirubin Urine: NEGATIVE
Glucose, UA: NEGATIVE mg/dL
KETONES UR: NEGATIVE mg/dL
LEUKOCYTES UA: NEGATIVE
Nitrite: NEGATIVE
PROTEIN: 100 mg/dL — AB
Specific Gravity, Urine: 1.012 (ref 1.005–1.030)
pH: 7.5 (ref 5.0–8.0)

## 2016-06-02 LAB — URINE MICROSCOPIC-ADD ON
RBC / HPF: NONE SEEN RBC/hpf (ref 0–5)
WBC, UA: NONE SEEN WBC/hpf (ref 0–5)

## 2016-06-02 LAB — PROTIME-INR
INR: 2.68
Prothrombin Time: 29 seconds — ABNORMAL HIGH (ref 11.4–15.2)

## 2016-06-02 LAB — BRAIN NATRIURETIC PEPTIDE: B NATRIURETIC PEPTIDE 5: 757.9 pg/mL — AB (ref 0.0–100.0)

## 2016-06-02 LAB — I-STAT TROPONIN, ED: TROPONIN I, POC: 0.01 ng/mL (ref 0.00–0.08)

## 2016-06-02 LAB — FIBRINOGEN: Fibrinogen: 651 mg/dL — ABNORMAL HIGH (ref 210–475)

## 2016-06-02 LAB — APTT: APTT: 50 s — AB (ref 24–36)

## 2016-06-02 MED ORDER — SODIUM CHLORIDE 0.9 % IV BOLUS (SEPSIS)
500.0000 mL | Freq: Once | INTRAVENOUS | Status: AC
  Administered 2016-06-02: 500 mL via INTRAVENOUS

## 2016-06-02 MED ORDER — DEXTROSE 5 % IV SOLN
2.0000 g | Freq: Once | INTRAVENOUS | Status: AC
  Administered 2016-06-02: 2 g via INTRAVENOUS
  Filled 2016-06-02: qty 2

## 2016-06-02 MED ORDER — VANCOMYCIN HCL IN DEXTROSE 1-5 GM/200ML-% IV SOLN: 1000.0000 mg | Freq: Once | INTRAVENOUS | Status: DC

## 2016-06-02 MED ORDER — VANCOMYCIN HCL IN DEXTROSE 1-5 GM/200ML-% IV SOLN
1000.0000 mg | Freq: Once | INTRAVENOUS | Status: AC
  Administered 2016-06-02: 1000 mg via INTRAVENOUS
  Filled 2016-06-02: qty 200

## 2016-06-02 MED ORDER — VANCOMYCIN HCL 10 G IV SOLR
1250.0000 mg | INTRAVENOUS | Status: DC
  Administered 2016-06-03 – 2016-06-04 (×2): 1250 mg via INTRAVENOUS
  Filled 2016-06-02 (×3): qty 1250

## 2016-06-02 MED ORDER — SODIUM CHLORIDE 0.9 % IV BOLUS (SEPSIS)
1000.0000 mL | Freq: Once | INTRAVENOUS | Status: AC
Start: 2016-06-02 — End: 2016-06-02
  Administered 2016-06-02: 1000 mL via INTRAVENOUS

## 2016-06-02 MED ORDER — SODIUM CHLORIDE 0.9 % IV BOLUS (SEPSIS)
1000.0000 mL | Freq: Once | INTRAVENOUS | Status: AC
  Administered 2016-06-02: 1000 mL via INTRAVENOUS

## 2016-06-02 MED ORDER — OSELTAMIVIR PHOSPHATE 30 MG PO CAPS
30.0000 mg | ORAL_CAPSULE | Freq: Once | ORAL | Status: AC
  Administered 2016-06-03: 30 mg via ORAL
  Filled 2016-06-02: qty 1

## 2016-06-02 MED ORDER — PIPERACILLIN-TAZOBACTAM 3.375 G IVPB 30 MIN
3.3750 g | Freq: Once | INTRAVENOUS | Status: AC
  Administered 2016-06-02: 3.375 g via INTRAVENOUS
  Filled 2016-06-02: qty 50

## 2016-06-02 MED ORDER — ACETAMINOPHEN 325 MG PO TABS
650.0000 mg | ORAL_TABLET | Freq: Once | ORAL | Status: AC
  Administered 2016-06-02: 650 mg via ORAL
  Filled 2016-06-02: qty 2

## 2016-06-02 MED ORDER — PIPERACILLIN-TAZOBACTAM 3.375 G IVPB
3.3750 g | Freq: Three times a day (TID) | INTRAVENOUS | Status: DC
  Administered 2016-06-03 – 2016-06-05 (×7): 3.375 g via INTRAVENOUS
  Filled 2016-06-02 (×9): qty 50

## 2016-06-02 MED ORDER — ACETAMINOPHEN 325 MG PO TABS: 975.0000 mg | ORAL_TABLET | Freq: Once | ORAL | Status: DC

## 2016-06-02 NOTE — ED Notes (Signed)
Cultures drawn before antibiotics

## 2016-06-02 NOTE — ED Notes (Signed)
Patient transported to x-ray. ?

## 2016-06-02 NOTE — ED Notes (Signed)
ED Provider at bedside. 

## 2016-06-02 NOTE — Progress Notes (Addendum)
ANTIBIOTIC CONSULT NOTE - INITIAL  Pharmacy Consult for zosyn/vancomycin Indication: sepsis  No Known Allergies  Vital Signs: Temp: 100.6 F (38.1 C) (11/26 1844) Temp Source: Oral (11/26 1844) BP: 152/107 (11/26 1844) Pulse Rate: 104 (11/26 1844) Intake/Output from previous day: No intake/output data recorded. Intake/Output from this shift: No intake/output data recorded.  Labs: No results for input(s): WBC, HGB, PLT, LABCREA, CREATININE in the last 72 hours. CrCl cannot be calculated (Patient's most recent lab result is older than the maximum 21 days allowed.). No results for input(s): VANCOTROUGH, VANCOPEAK, VANCORANDOM, GENTTROUGH, GENTPEAK, GENTRANDOM, TOBRATROUGH, TOBRAPEAK, TOBRARND, AMIKACINPEAK, AMIKACINTROU, AMIKACIN in the last 72 hours.   Microbiology: Recent Results (from the past 720 hour(s))  Urine culture     Status: Abnormal   Collection Time: 05/03/16  7:25 PM  Result Value Ref Range Status   Specimen Description URINE, RANDOM  Final   Special Requests NONE  Final   Culture MULTIPLE SPECIES PRESENT, SUGGEST RECOLLECTION (A)  Final   Report Status 05/05/2016 FINAL  Final    Medical History: Past Medical History:  Diagnosis Date  . Chronic diastolic CHF (congestive heart failure) (HCC)   . Coronary artery disease    s/p CABG  . Diabetes mellitus without complication (HCC)   . Hyperlipidemia   . Hypertension   . Morbid obesity (HCC)   . OSA (obstructive sleep apnea)    mild OSA with AHI 7.15 now on CPAP at 16cm H2O  . RBBB     Assessment: 75 yo female here for r/o sepsis. Pharmacy consulted to dose vancomycin and zosyn -vancomycin 1000mg  and zosyn 3.375gm order in the ED -Labs pending; SCr was 1.51 on 11/2  11/26 vanc 11/26 zosyn  11/26 blood x2 11/26 urine  Goal of Therapy:  Vancomycin trough level 15-20 mcg/ml  Plan:  -Zosyn 3.375gm IV q8h -Vancomycin 1000mg  x1 (to complete a total load of 2000mg  -Will follow renal function, cultures  and clinical progress  Harland Germanndrew Jenee Spaugh, Pharm D 06/02/2016 7:06 PM  Addendum -SCr= 1.63, CrCl ~ 30  Plan -Vancomycin 1250mg  IV q24h  Harland GermanAndrew Britnay Magnussen, Pharm D 06/02/2016 8:11 PM

## 2016-06-02 NOTE — ED Provider Notes (Signed)
[facility]-EM[facility]ENCY DEPT Provider Note   CSN: [id number] Arrival date & time: 06/02/16  1836     History   Chief Complaint Chief Complaint  Patient presents with  . Altered Mental Status     HPI  Blood pressure (!) 152/107, pulse 104, temperature 100.6 F (38.1 C), temperature source Oral, resp. rate 22, SpO2 96 %.  Lisa [name] is a 75 y.o. female with past medical history significant for CAD, CHF, diabetes (not on any medications, taken off for weight loss and diet control, morbid obesity with obstructive sleep apnea, recently discharged from rehabilitation facilility x3 days had been in her normal state of health until this morning, she lives alone but [place] staying [place] her, no fever, chills, nausea, vomiting. Patient is reporting diffuse myalgia. As per patient she did have a flu shot this year. As per her daughter she is chronically on 2 L via nasal cannula status post discharge from SNF several days ago. Morbidly obese with obstructive sleep apnea. Denies cough above her baseline, focal chest or abdominal pain. Patient was 84% by EMS but this was when she was reclining, responded well to oxygen supplementation.  Past Medical History:  Diagnosis Date  . Chronic diastolic CHF (congestive heart failure) (HCC)   . Coronary artery disease    s/p CABG  . Diabetes mellitus without complication (HCC)   . Hyperlipidemia   . Hypertension   . Morbid obesity (HCC)   . OSA (obstructive sleep apnea)    mild OSA with AHI 7.15 now on CPAP at 16cm H2O  . RBBB     Patient Active Problem List   Diagnosis Date Noted  . Chest pain 05/08/2016  . Acute kidney injury superimposed on chronic kidney disease (HCC)   . Renal failure   . PAF- DDCV Jan 2017 05/06/2016  . Abdominal pain   . Chest pain, atypical 05/03/2016  . Nausea, vomiting and diarrhea 05/03/2016  . Bradycardia 01/29/2016  . Persistent atrial fibrillation (HCC) 04/24/2015  . Ankle  fracture 08/18/2014  . Closed right ankle fracture 08/18/2014  . Fall   . PVC (premature ventricular contraction) 03/22/2014  . SOB (shortness of breath) 09/22/2013  . Hx of CABG 05/06/2013  . Chronic diastolic CHF (congestive heart failure) (HCC) 05/06/2013  . OSA (obstructive sleep apnea) 05/06/2013  . Morbid obesity (HCC)   . Diastolic dysfunction   . Hyperlipidemia   . GERD (gastroesophageal reflux disease) 10/27/2010  . Gout 10/27/2010  . Hypertension 10/27/2010  . Asthma 10/27/2010  . Diabetes mellitus (HCC) 10/27/2010    Past Surgical History:  Procedure Laterality Date  . BACK SURGERY    . CARDIAC CATHETERIZATION    . CARDIAC SURGERY    . CARDIOVERSION N/A 08/03/2015   Procedure: CARDIOVERSION;  Surgeon: Vesta MixerPhilip J [name], MD;  [facility];  Service: Cardiovascular;  Laterality: N/A;  . CORONARY ANGIOPLASTY WITH STENT PLACEMENT    . CORONARY ARTERY BYPASS GRAFT    . KNEE SURGERY Bilateral   . ORIF ANKLE FRACTURE Right 08/19/2014   Procedure: OPEN REDUCTION INTERNAL FIXATION (ORIF) ANKLE FRACTURE;  Surgeon: Cammy CopaGregory Scott Dean, MD;  [facility];  Service: Orthopedics;  Laterality: Right;  . rotator cuff surgery      OB History    No data available      Home Medications    Prior to Admission medications   Medication Sig Start Date End Date Taking? Authorizing Provider  amLODipine (NORVASC) 5 MG tablet  Take 1 tablet (5 mg total) by mouth daily. 09/20/15  Yes Quintella Reichert, MD  apixaban (ELIQUIS) 5 MG TABS tablet Take 1 tablet (5 mg total) by mouth 2 (two) times daily. 08/03/15  Yes Quintella Reichert, MD  atorvastatin (LIPITOR) 10 MG tablet Take 1 tablet (10 mg total) by mouth daily at 6 PM. 05/08/16  Yes Elease Etienne, MD  buPROPion (WELLBUTRIN XL) 150 MG 24 hr tablet Take 150 mg by mouth daily. Reported on 08/02/2015   Yes Historical Provider, MD  etodolac (LODINE) 400 MG tablet Take 400 mg by mouth 2 (two) times daily.   Yes Historical Provider, MD    fluticasone (FLOVENT HFA) 220 MCG/ACT inhaler Inhale 2 puffs into the lungs 2 (two) times daily as needed (shortness of breath/ wheezing).   Yes Historical Provider, MD  furosemide (LASIX) 20 MG tablet Take 40 mg by mouth daily.   Yes Historical Provider, MD  gabapentin (NEURONTIN) 600 MG tablet Take 1 tablet (600 mg total) by mouth 2 (two) times daily. 05/08/16  Yes Elease Etienne, MD  ipratropium-albuterol (DUONEB) 0.5-2.5 (3) MG/3ML SOLN Take 3 mLs by nebulization every 6 (six) hours as needed. Wheezing or shortness of breath. Patient taking differently: Take 3 mLs by nebulization every 6 (six) hours as needed (shortness of breath/ wheezing).  05/08/16  Yes Elease Etienne, MD  metoprolol succinate (TOPROL XL) 25 MG 24 hr tablet Take 1 tablet (25 mg total) by mouth daily. 05/03/16  Yes Quintella Reichert, MD  omeprazole (PRILOSEC) 20 MG capsule Take 20 mg by mouth daily.   Yes Historical Provider, MD  OXYGEN Inhale 2 L into the lungs continuous.   Yes Historical Provider, MD  traMADol (ULTRAM) 50 MG tablet Take 50 mg by mouth every 12 (twelve) hours.   Yes Historical Provider, MD  esomeprazole (NEXIUM) 40 MG capsule Take 1 capsule (40 mg total) by mouth daily before breakfast. Patient not taking: Reported on 06/02/2016 05/03/16   Quintella Reichert, MD    Family History Family History  Problem Relation Age of Onset  . Heart attack Mother   . Heart disease Mother   . Stomach cancer Father     Social History Social History  Substance Use Topics  . Smoking status: Former Games developer  . Smokeless tobacco: Never Used  . Alcohol use No     Allergies   Patient has no known allergies.   Review of Systems Review of Systems   10 systems reviewed and found to be negative, except as noted in the HPI.  Physical Exam Updated Vital Signs BP 141/83   Pulse (!) 59   Temp 99.4 F (37.4 C) (Oral)   Resp 19   SpO2 96%   Physical Exam  Constitutional: She is oriented to person, place, and time.  She appears well-developed and well-nourished. No distress.  Morbidly obese  HENT:  Head: Normocephalic and atraumatic.  Mouth/Throat: Oropharynx is clear and moist.  Eyes: Conjunctivae and EOM are normal. Pupils are equal, round, and reactive to light.  Neck: Normal range of motion.  FROM to C-spine. Pt can touch chin to chest without discomfort. No TTP of midline cervical spine.   Cardiovascular: Normal rate, regular rhythm and intact distal pulses.   Pulmonary/Chest: Effort normal and breath sounds normal.  On nasal canula at 4 lpm  Abdominal: Soft. There is no tenderness.  Musculoskeletal: Normal range of motion.  Neurological: She is alert and oriented to person, place, and time. No  cranial nerve deficit.  MAE, strength and sensation is grossly intact  Skin: Capillary refill takes less than 2 seconds. Rash noted. She is not diaphoretic.  Petechial rash to RLE and pictured  Psychiatric: She has a normal mood and affect.  Nursing note and vitals reviewed.          ED Treatments / Results  Labs (all labs ordered are listed, but only abnormal results are displayed) Labs Reviewed  COMPREHENSIVE METABOLIC PANEL - Abnormal; Notable for the following:       Result Value   Chloride 100 (*)    Glucose, Bld 113 (*)    Creatinine, Ser 1.63 (*)    Albumin 2.6 (*)    ALT 13 (*)    Total Bilirubin 1.3 (*)    GFR calc non Af Amer 29 (*)    GFR calc Af Amer 34 (*)    All other components within normal limits  CBC WITH DIFFERENTIAL/PLATELET - Abnormal; Notable for the following:    WBC 10.6 (*)    RBC 2.89 (*)    Hemoglobin 9.1 (*)    HCT 27.8 (*)    Neutro Abs 8.2 (*)    All other components within normal limits  URINALYSIS, ROUTINE W REFLEX MICROSCOPIC (NOT AT [facility]) - Abnormal; Notable for the following:    Hgb urine dipstick SMALL (*)    Protein, ur 100 (*)    All other components within normal limits  BRAIN NATRIURETIC PEPTIDE - Abnormal; Notable for the following:    B  Natriuretic Peptide 757.9 (*)    All other components within normal limits  APTT - Abnormal; Notable for the following:    aPTT 50 (*)    All other components within normal limits  PROTIME-INR - Abnormal; Notable for the following:    Prothrombin Time 29.0 (*)    All other components within normal limits  FIBRINOGEN - Abnormal; Notable for the following:    Fibrinogen 651 (*)    All other components within normal limits  URINE MICROSCOPIC-ADD ON - Abnormal; Notable for the following:    Squamous Epithelial / LPF 0-5 (*)    Bacteria, UA RARE (*)    All other components within normal limits  CULTURE, BLOOD (ROUTINE X 2)  CULTURE, BLOOD (ROUTINE X 2)  URINE CULTURE  INFLUENZA PANEL BY PCR (TYPE A & B, H1N1)  I-STAT CG4 LACTIC ACID, ED  I-STAT TROPOININ, ED  I-STAT CG4 LACTIC ACID, ED    EKG  EKG Interpretation  Date/Time:  Sunday June 02 2016 18:48:35 EST Ventricular Rate:  108 PR Interval:    QRS Duration: 157 QT Interval:  366 QTC Calculation: 441 R Axis:   -62 Text Interpretation:  Sinus tachycardia Paired ventricular premature complexes Nonspecific IVCD with LAD LVH with secondary repolarization abnormality Inferior infarct, acute (RCA) Probable anterior infarct, age indeterminate Probable RV involvement, suggest recording right precordial leads Baseline wander in lead(s) I III aVL frequent PVCs possible depression in I and aVL, T wave inversions in V3 new from previous tachycardia new from previous Confirmed by LITTLE MD, RACHEL [phone]) on 06/02/2016 6:54:28 PM       Radiology Dg Chest 2 View  Result Date: 06/02/2016 CLINICAL DATA:  Trouble breathing today, weakness EXAM: CHEST  2 VIEW COMPARISON:  05/05/2016 FINDINGS: Median sternotomy wires are again evident. Discoid atelectasis in the right mid lung zone. Linear atelectasis in the left upper lobe. Low lung volumes. Suspect tiny left pleural effusion. Mild to moderate cardiomegaly with  mild central congestion. No  pneumothorax. Partially visualized spinal hardware. IMPRESSION: 1. Low lung volumes. 2. Linear atelectasis in the right lower lung zone. Linear atelectasis in the left upper lobe. 3. Stable cardiomegaly. Mild central vascular congestion. Suspect tiny left effusion. Electronically Signed   By: Jasmine Pang M.D.   On: 06/02/2016 23:42    Procedures Procedures (including critical care time)  Medications Ordered in ED Medications  piperacillin-tazobactam (ZOSYN) IVPB 3.375 g (not administered)  vancomycin (VANCOCIN) 1,250 mg in sodium chloride 0.9 % 250 mL IVPB (not administered)  oseltamivir (TAMIFLU) capsule 30 mg (not administered)  acetaminophen (TYLENOL) tablet 650 mg (650 mg Oral Given 06/02/16 1915)  sodium chloride 0.9 % bolus 1,000 mL (0 mLs Intravenous Stopped 06/02/16 2310)    And  sodium chloride 0.9 % bolus 1,000 mL (0 mLs Intravenous Stopped 06/02/16 2157)    And  sodium chloride 0.9 % bolus 1,000 mL (0 mLs Intravenous Stopped 06/02/16 2024)    And  sodium chloride 0.9 % bolus 500 mL (0 mLs Intravenous Stopped 06/02/16 2154)  piperacillin-tazobactam (ZOSYN) IVPB 3.375 g (0 g Intravenous Stopped 06/02/16 1953)  vancomycin (VANCOCIN) IVPB 1000 mg/200 mL premix (0 mg Intravenous Stopped 06/02/16 2152)  cefTRIAXone (ROCEPHIN) 2 g in dextrose 5 % 50 mL IVPB (0 g Intravenous Stopped 06/02/16 2309)     Initial Impression / Assessment and Plan / ED Course  I have reviewed the triage vital signs and the nursing notes.  Pertinent labs & imaging results that were available during my care of the patient were reviewed by me and considered in my medical decision making (see chart for details).  Clinical Course     Vitals:   06/02/16 2130 06/02/16 2145 06/02/16 2200 06/02/16 2215  BP: 130/81 149/75 140/62 141/83  Pulse:  (!) 59    Resp: 19 20 19 19   Temp:      TempSrc:      SpO2: 96%       Medications  piperacillin-tazobactam (ZOSYN) IVPB 3.375 g (not administered)    vancomycin (VANCOCIN) 1,250 mg in sodium chloride 0.9 % 250 mL IVPB (not administered)  oseltamivir (TAMIFLU) capsule 30 mg (not administered)  acetaminophen (TYLENOL) tablet 650 mg (650 mg Oral Given 06/02/16 1915)  sodium chloride 0.9 % bolus 1,000 mL (0 mLs Intravenous Stopped 06/02/16 2310)    And  sodium chloride 0.9 % bolus 1,000 mL (0 mLs Intravenous Stopped 06/02/16 2157)    And  sodium chloride 0.9 % bolus 1,000 mL (0 mLs Intravenous Stopped 06/02/16 2024)    And  sodium chloride 0.9 % bolus 500 mL (0 mLs Intravenous Stopped 06/02/16 2154)  piperacillin-tazobactam (ZOSYN) IVPB 3.375 g (0 g Intravenous Stopped 06/02/16 1953)  vancomycin (VANCOCIN) IVPB 1000 mg/200 mL premix (0 mg Intravenous Stopped 06/02/16 2152)  cefTRIAXone (ROCEPHIN) 2 g in dextrose 5 % 50 mL IVPB (0 g Intravenous Stopped 06/02/16 2309)    Lisa [name] is 75 y.o. female presenting with Fever and as per her daughter she is less alert than normal, on my exam patient is oriented 3, neurologic exam nonfocal. This patient was hypoxic to 84% when EMS arrived she was lying supine however placement has sleep apnea and is morbidly obese, responded well to oxygen supplementation, patient is on 2 L at all times. She is febrile to 100.6, she also has a petechial rash to the lower extremities. Patient is started on broad-spectrum antibiotics, no meningeal sign and patient denies headache. Although I think is highly unlikely  that this is a meningitis will give Rocephin to cover, given her habitus I think it is reasonable to attempt LP at this point. Discussed with attending physician who agrees with care plan.  Mild leukocytosis at 10.6 normal lactic acid cath urine not consistent with infection, influenza pending, will give Tamiflu. Chest x-ray without infiltrate. Chest x-ray is not consistent with CHF exacerbation, BNP is elevated, patient has already received full 30 mL per KG bolus, lung sounds remained clear, discussed  with admitting physician who will give Lasix to prevent complication on the floor.  Discussed with Dr. Katrinka BlazingSmith who accepts admission, will evaluate the patient and put him holding orders.  Final Clinical Impressions(s) / ED Diagnoses   Final diagnoses:  Influenza-like illness  Rash and nonspecific skin eruption    New Prescriptions New Prescriptions   No medications on file     Wynetta Emeryicole Reggie Bise, PA-C 06/03/16 0023    Wynetta EmeryNicole Lavone Barrientes, PA-C 06/03/16 0037    Laurence Spatesachel Morgan Little, MD 06/04/16 2040

## 2016-06-02 NOTE — ED Notes (Signed)
Pt returned to room and replaced on monitor

## 2016-06-02 NOTE — ED Notes (Signed)
[facility] contacted to activate Code Sepsis 

## 2016-06-02 NOTE — ED Triage Notes (Signed)
Per [facility], pt from home. Family called due to pt appearing to have trouble breathing. Weak in bed, last day hasn't gotten out of bed. Recently got out of rehab due to ambulatory issues. Normally pt is more coherent. Pt eyes are open and talking. Pt is hot to the touch. Generalized body aches. Lung sounds clear by ems. 84% by EMS, pt placed on 4L now 99%.

## 2016-06-03 ENCOUNTER — Encounter (HOSPITAL_COMMUNITY): Payer: Self-pay

## 2016-06-03 DIAGNOSIS — N189 Chronic kidney disease, unspecified: Secondary | ICD-10-CM | POA: Diagnosis not present

## 2016-06-03 DIAGNOSIS — I5033 Acute on chronic diastolic (congestive) heart failure: Secondary | ICD-10-CM | POA: Diagnosis not present

## 2016-06-03 DIAGNOSIS — N183 Chronic kidney disease, stage 3 (moderate): Secondary | ICD-10-CM | POA: Diagnosis not present

## 2016-06-03 DIAGNOSIS — Z6841 Body Mass Index (BMI) 40.0 and over, adult: Secondary | ICD-10-CM | POA: Diagnosis not present

## 2016-06-03 DIAGNOSIS — Z87891 Personal history of nicotine dependence: Secondary | ICD-10-CM | POA: Diagnosis not present

## 2016-06-03 DIAGNOSIS — R1312 Dysphagia, oropharyngeal phase: Secondary | ICD-10-CM | POA: Diagnosis not present

## 2016-06-03 DIAGNOSIS — M79609 Pain in unspecified limb: Secondary | ICD-10-CM | POA: Diagnosis not present

## 2016-06-03 DIAGNOSIS — G9340 Encephalopathy, unspecified: Secondary | ICD-10-CM | POA: Diagnosis present

## 2016-06-03 DIAGNOSIS — R531 Weakness: Secondary | ICD-10-CM | POA: Diagnosis not present

## 2016-06-03 DIAGNOSIS — A419 Sepsis, unspecified organism: Secondary | ICD-10-CM | POA: Diagnosis not present

## 2016-06-03 DIAGNOSIS — M1990 Unspecified osteoarthritis, unspecified site: Secondary | ICD-10-CM | POA: Diagnosis present

## 2016-06-03 DIAGNOSIS — K219 Gastro-esophageal reflux disease without esophagitis: Secondary | ICD-10-CM | POA: Diagnosis not present

## 2016-06-03 DIAGNOSIS — I5032 Chronic diastolic (congestive) heart failure: Secondary | ICD-10-CM

## 2016-06-03 DIAGNOSIS — R21 Rash and other nonspecific skin eruption: Secondary | ICD-10-CM | POA: Insufficient documentation

## 2016-06-03 DIAGNOSIS — R488 Other symbolic dysfunctions: Secondary | ICD-10-CM | POA: Diagnosis not present

## 2016-06-03 DIAGNOSIS — I10 Essential (primary) hypertension: Secondary | ICD-10-CM | POA: Diagnosis not present

## 2016-06-03 DIAGNOSIS — R69 Illness, unspecified: Secondary | ICD-10-CM

## 2016-06-03 DIAGNOSIS — D649 Anemia, unspecified: Secondary | ICD-10-CM | POA: Diagnosis not present

## 2016-06-03 DIAGNOSIS — D638 Anemia in other chronic diseases classified elsewhere: Secondary | ICD-10-CM | POA: Diagnosis present

## 2016-06-03 DIAGNOSIS — G4733 Obstructive sleep apnea (adult) (pediatric): Secondary | ICD-10-CM | POA: Diagnosis not present

## 2016-06-03 DIAGNOSIS — N289 Disorder of kidney and ureter, unspecified: Secondary | ICD-10-CM | POA: Diagnosis not present

## 2016-06-03 DIAGNOSIS — I480 Paroxysmal atrial fibrillation: Secondary | ICD-10-CM | POA: Diagnosis not present

## 2016-06-03 DIAGNOSIS — E785 Hyperlipidemia, unspecified: Secondary | ICD-10-CM | POA: Diagnosis not present

## 2016-06-03 DIAGNOSIS — I129 Hypertensive chronic kidney disease with stage 1 through stage 4 chronic kidney disease, or unspecified chronic kidney disease: Secondary | ICD-10-CM | POA: Diagnosis not present

## 2016-06-03 DIAGNOSIS — E1122 Type 2 diabetes mellitus with diabetic chronic kidney disease: Secondary | ICD-10-CM | POA: Diagnosis present

## 2016-06-03 DIAGNOSIS — I130 Hypertensive heart and chronic kidney disease with heart failure and stage 1 through stage 4 chronic kidney disease, or unspecified chronic kidney disease: Secondary | ICD-10-CM | POA: Diagnosis present

## 2016-06-03 DIAGNOSIS — Z951 Presence of aortocoronary bypass graft: Secondary | ICD-10-CM | POA: Diagnosis not present

## 2016-06-03 DIAGNOSIS — R0602 Shortness of breath: Secondary | ICD-10-CM | POA: Diagnosis not present

## 2016-06-03 DIAGNOSIS — I481 Persistent atrial fibrillation: Secondary | ICD-10-CM | POA: Diagnosis not present

## 2016-06-03 DIAGNOSIS — D508 Other iron deficiency anemias: Secondary | ICD-10-CM | POA: Diagnosis not present

## 2016-06-03 DIAGNOSIS — N179 Acute kidney failure, unspecified: Secondary | ICD-10-CM | POA: Diagnosis not present

## 2016-06-03 DIAGNOSIS — M7989 Other specified soft tissue disorders: Secondary | ICD-10-CM | POA: Diagnosis present

## 2016-06-03 DIAGNOSIS — I2510 Atherosclerotic heart disease of native coronary artery without angina pectoris: Secondary | ICD-10-CM | POA: Diagnosis present

## 2016-06-03 DIAGNOSIS — Z515 Encounter for palliative care: Secondary | ICD-10-CM | POA: Diagnosis not present

## 2016-06-03 DIAGNOSIS — E1140 Type 2 diabetes mellitus with diabetic neuropathy, unspecified: Secondary | ICD-10-CM | POA: Diagnosis present

## 2016-06-03 DIAGNOSIS — R4182 Altered mental status, unspecified: Secondary | ICD-10-CM | POA: Diagnosis present

## 2016-06-03 DIAGNOSIS — R627 Adult failure to thrive: Secondary | ICD-10-CM | POA: Diagnosis present

## 2016-06-03 DIAGNOSIS — Z7901 Long term (current) use of anticoagulants: Secondary | ICD-10-CM | POA: Diagnosis not present

## 2016-06-03 DIAGNOSIS — J80 Acute respiratory distress syndrome: Secondary | ICD-10-CM | POA: Diagnosis not present

## 2016-06-03 DIAGNOSIS — J111 Influenza due to unidentified influenza virus with other respiratory manifestations: Secondary | ICD-10-CM | POA: Diagnosis present

## 2016-06-03 DIAGNOSIS — J9621 Acute and chronic respiratory failure with hypoxia: Secondary | ICD-10-CM | POA: Diagnosis not present

## 2016-06-03 DIAGNOSIS — Z9981 Dependence on supplemental oxygen: Secondary | ICD-10-CM | POA: Diagnosis not present

## 2016-06-03 DIAGNOSIS — E662 Morbid (severe) obesity with alveolar hypoventilation: Secondary | ICD-10-CM | POA: Diagnosis present

## 2016-06-03 DIAGNOSIS — M6281 Muscle weakness (generalized): Secondary | ICD-10-CM | POA: Diagnosis not present

## 2016-06-03 DIAGNOSIS — Z7189 Other specified counseling: Secondary | ICD-10-CM | POA: Diagnosis not present

## 2016-06-03 DIAGNOSIS — I482 Chronic atrial fibrillation: Secondary | ICD-10-CM | POA: Diagnosis not present

## 2016-06-03 DIAGNOSIS — E876 Hypokalemia: Secondary | ICD-10-CM | POA: Diagnosis not present

## 2016-06-03 LAB — BLOOD CULTURE ID PANEL (REFLEXED)
Acinetobacter baumannii: NOT DETECTED
CANDIDA KRUSEI: NOT DETECTED
CANDIDA PARAPSILOSIS: NOT DETECTED
Candida albicans: NOT DETECTED
Candida glabrata: NOT DETECTED
Candida tropicalis: NOT DETECTED
ENTEROCOCCUS SPECIES: NOT DETECTED
ESCHERICHIA COLI: NOT DETECTED
Enterobacter cloacae complex: NOT DETECTED
Enterobacteriaceae species: NOT DETECTED
HAEMOPHILUS INFLUENZAE: NOT DETECTED
Klebsiella oxytoca: NOT DETECTED
Klebsiella pneumoniae: NOT DETECTED
LISTERIA MONOCYTOGENES: NOT DETECTED
METHICILLIN RESISTANCE: DETECTED — AB
Neisseria meningitidis: NOT DETECTED
PROTEUS SPECIES: NOT DETECTED
Pseudomonas aeruginosa: NOT DETECTED
SERRATIA MARCESCENS: NOT DETECTED
STAPHYLOCOCCUS AUREUS BCID: NOT DETECTED
STAPHYLOCOCCUS SPECIES: DETECTED — AB
STREPTOCOCCUS AGALACTIAE: NOT DETECTED
Streptococcus pneumoniae: NOT DETECTED
Streptococcus pyogenes: NOT DETECTED
Streptococcus species: NOT DETECTED

## 2016-06-03 LAB — CBC
HCT: 26.1 % — ABNORMAL LOW (ref 36.0–46.0)
HEMOGLOBIN: 8.4 g/dL — AB (ref 12.0–15.0)
MCH: 31.1 pg (ref 26.0–34.0)
MCHC: 32.2 g/dL (ref 30.0–36.0)
MCV: 96.7 fL (ref 78.0–100.0)
PLATELETS: 182 10*3/uL (ref 150–400)
RBC: 2.7 MIL/uL — AB (ref 3.87–5.11)
RDW: 13.7 % (ref 11.5–15.5)
WBC: 11 10*3/uL — ABNORMAL HIGH (ref 4.0–10.5)

## 2016-06-03 LAB — RPR: RPR Ser Ql: NONREACTIVE

## 2016-06-03 LAB — SEDIMENTATION RATE: SED RATE: 113 mm/h — AB (ref 0–22)

## 2016-06-03 LAB — COMPREHENSIVE METABOLIC PANEL
ALBUMIN: 2.4 g/dL — AB (ref 3.5–5.0)
ALK PHOS: 87 U/L (ref 38–126)
ALT: 12 U/L — AB (ref 14–54)
ANION GAP: 11 (ref 5–15)
AST: 28 U/L (ref 15–41)
BUN: 20 mg/dL (ref 6–20)
CALCIUM: 8.5 mg/dL — AB (ref 8.9–10.3)
CHLORIDE: 101 mmol/L (ref 101–111)
CO2: 24 mmol/L (ref 22–32)
Creatinine, Ser: 1.47 mg/dL — ABNORMAL HIGH (ref 0.44–1.00)
GFR calc non Af Amer: 33 mL/min — ABNORMAL LOW (ref >60)
GFR, EST AFRICAN AMERICAN: 39 mL/min — AB (ref >60)
GLUCOSE: 114 mg/dL — AB (ref 65–99)
Potassium: 4 mmol/L (ref 3.5–5.1)
SODIUM: 136 mmol/L (ref 135–145)
Total Bilirubin: 1.3 mg/dL — ABNORMAL HIGH (ref 0.3–1.2)
Total Protein: 6.5 g/dL (ref 6.5–8.1)

## 2016-06-03 LAB — MRSA PCR SCREENING: MRSA by PCR: NEGATIVE

## 2016-06-03 LAB — INFLUENZA PANEL BY PCR (TYPE A & B)
INFLAPCR: NEGATIVE
INFLBPCR: NEGATIVE

## 2016-06-03 LAB — RESPIRATORY PANEL BY PCR
ADENOVIRUS-RVPPCR: NOT DETECTED
BORDETELLA PERTUSSIS-RVPCR: NOT DETECTED
CHLAMYDOPHILA PNEUMONIAE-RVPPCR: NOT DETECTED
CORONAVIRUS NL63-RVPPCR: NOT DETECTED
Coronavirus 229E: NOT DETECTED
Coronavirus HKU1: NOT DETECTED
Coronavirus OC43: NOT DETECTED
INFLUENZA A-RVPPCR: NOT DETECTED
INFLUENZA B-RVPPCR: NOT DETECTED
MYCOPLASMA PNEUMONIAE-RVPPCR: NOT DETECTED
Metapneumovirus: NOT DETECTED
PARAINFLUENZA VIRUS 3-RVPPCR: NOT DETECTED
PARAINFLUENZA VIRUS 4-RVPPCR: NOT DETECTED
Parainfluenza Virus 1: NOT DETECTED
Parainfluenza Virus 2: NOT DETECTED
RESPIRATORY SYNCYTIAL VIRUS-RVPPCR: NOT DETECTED
RHINOVIRUS / ENTEROVIRUS - RVPPCR: NOT DETECTED

## 2016-06-03 LAB — GLUCOSE, CAPILLARY: GLUCOSE-CAPILLARY: 95 mg/dL (ref 65–99)

## 2016-06-03 LAB — PROCALCITONIN: PROCALCITONIN: 0.4 ng/mL

## 2016-06-03 LAB — C-REACTIVE PROTEIN: CRP: 14.7 mg/dL — AB (ref <1.0)

## 2016-06-03 MED ORDER — ATORVASTATIN CALCIUM 10 MG PO TABS
10.0000 mg | ORAL_TABLET | Freq: Every day | ORAL | Status: DC
  Administered 2016-06-03 – 2016-06-12 (×10): 10 mg via ORAL
  Filled 2016-06-03 (×10): qty 1

## 2016-06-03 MED ORDER — PANTOPRAZOLE SODIUM 40 MG PO TBEC
40.0000 mg | DELAYED_RELEASE_TABLET | Freq: Every day | ORAL | Status: DC
  Administered 2016-06-03 – 2016-06-13 (×11): 40 mg via ORAL
  Filled 2016-06-03 (×12): qty 1

## 2016-06-03 MED ORDER — ONDANSETRON HCL 4 MG/2ML IJ SOLN: 4.0000 mg | Freq: Four times a day (QID) | INTRAMUSCULAR | Status: DC | PRN

## 2016-06-03 MED ORDER — METOPROLOL SUCCINATE ER 25 MG PO TB24
25.0000 mg | ORAL_TABLET | Freq: Every day | ORAL | Status: DC
  Administered 2016-06-03 – 2016-06-12 (×10): 25 mg via ORAL
  Filled 2016-06-03 (×10): qty 1

## 2016-06-03 MED ORDER — FUROSEMIDE 10 MG/ML IJ SOLN
40.0000 mg | Freq: Two times a day (BID) | INTRAMUSCULAR | Status: DC
  Administered 2016-06-03 – 2016-06-04 (×4): 40 mg via INTRAVENOUS
  Filled 2016-06-03 (×4): qty 4

## 2016-06-03 MED ORDER — OSELTAMIVIR PHOSPHATE 30 MG PO CAPS
30.0000 mg | ORAL_CAPSULE | Freq: Two times a day (BID) | ORAL | Status: DC
  Filled 2016-06-03: qty 1

## 2016-06-03 MED ORDER — ACETAMINOPHEN 325 MG PO TABS
650.0000 mg | ORAL_TABLET | Freq: Four times a day (QID) | ORAL | Status: DC | PRN
  Administered 2016-06-03 – 2016-06-12 (×6): 650 mg via ORAL
  Filled 2016-06-03 (×6): qty 2

## 2016-06-03 MED ORDER — GABAPENTIN 300 MG PO CAPS
600.0000 mg | ORAL_CAPSULE | Freq: Two times a day (BID) | ORAL | Status: DC
  Administered 2016-06-03 – 2016-06-13 (×22): 600 mg via ORAL
  Filled 2016-06-03 (×22): qty 2

## 2016-06-03 MED ORDER — FLUTICASONE PROPIONATE HFA 220 MCG/ACT IN AERO: 2.0000 | INHALATION_SPRAY | Freq: Two times a day (BID) | RESPIRATORY_TRACT | Status: DC | PRN

## 2016-06-03 MED ORDER — SODIUM CHLORIDE 0.9% FLUSH: 3.0000 mL | INTRAVENOUS | Status: DC | PRN

## 2016-06-03 MED ORDER — TRAMADOL HCL 50 MG PO TABS
50.0000 mg | ORAL_TABLET | Freq: Two times a day (BID) | ORAL | Status: DC
  Administered 2016-06-03 – 2016-06-13 (×22): 50 mg via ORAL
  Filled 2016-06-03 (×22): qty 1

## 2016-06-03 MED ORDER — BUPROPION HCL ER (XL) 150 MG PO TB24
150.0000 mg | ORAL_TABLET | Freq: Every day | ORAL | Status: DC
  Administered 2016-06-03 – 2016-06-13 (×11): 150 mg via ORAL
  Filled 2016-06-03 (×11): qty 1

## 2016-06-03 MED ORDER — SODIUM CHLORIDE 0.9% FLUSH
3.0000 mL | Freq: Two times a day (BID) | INTRAVENOUS | Status: DC
  Administered 2016-06-03 – 2016-06-13 (×19): 3 mL via INTRAVENOUS

## 2016-06-03 MED ORDER — IPRATROPIUM-ALBUTEROL 0.5-2.5 (3) MG/3ML IN SOLN: 3.0000 mL | Freq: Four times a day (QID) | RESPIRATORY_TRACT | Status: DC | PRN

## 2016-06-03 MED ORDER — AMLODIPINE BESYLATE 5 MG PO TABS
5.0000 mg | ORAL_TABLET | Freq: Every day | ORAL | Status: DC
  Administered 2016-06-03 – 2016-06-04 (×2): 5 mg via ORAL
  Filled 2016-06-03 (×2): qty 1

## 2016-06-03 MED ORDER — ACETAMINOPHEN 650 MG RE SUPP: 650.0000 mg | Freq: Four times a day (QID) | RECTAL | Status: DC | PRN

## 2016-06-03 MED ORDER — ONDANSETRON HCL 4 MG PO TABS: 4.0000 mg | ORAL_TABLET | Freq: Four times a day (QID) | ORAL | Status: DC | PRN

## 2016-06-03 MED ORDER — APIXABAN 5 MG PO TABS
5.0000 mg | ORAL_TABLET | Freq: Two times a day (BID) | ORAL | Status: DC
  Administered 2016-06-03 – 2016-06-13 (×21): 5 mg via ORAL
  Filled 2016-06-03 (×21): qty 1

## 2016-06-03 NOTE — ED Notes (Signed)
No blood draw,  Pt enroute to inpatient floor. 

## 2016-06-03 NOTE — Progress Notes (Signed)
PHARMACY - PHYSICIAN COMMUNICATION CRITICAL VALUE ALERT - BLOOD CULTURE IDENTIFICATION (BCID)  Results for orders placed or performed during the hospital encounter of 06/02/16  Blood Culture ID Panel (Reflexed) (Collected: 06/02/2016  2:13 PM)  Result Value Ref Range   Enterococcus species NOT DETECTED NOT DETECTED   Listeria monocytogenes NOT DETECTED NOT DETECTED   Staphylococcus species DETECTED (A) NOT DETECTED   Staphylococcus aureus NOT DETECTED NOT DETECTED   Methicillin resistance DETECTED (A) NOT DETECTED   Streptococcus species NOT DETECTED NOT DETECTED   Streptococcus agalactiae NOT DETECTED NOT DETECTED   Streptococcus pneumoniae NOT DETECTED NOT DETECTED   Streptococcus pyogenes NOT DETECTED NOT DETECTED   Acinetobacter baumannii NOT DETECTED NOT DETECTED   Enterobacteriaceae species NOT DETECTED NOT DETECTED   Enterobacter cloacae complex NOT DETECTED NOT DETECTED   Escherichia coli NOT DETECTED NOT DETECTED   Klebsiella oxytoca NOT DETECTED NOT DETECTED   Klebsiella pneumoniae NOT DETECTED NOT DETECTED   Proteus species NOT DETECTED NOT DETECTED   Serratia marcescens NOT DETECTED NOT DETECTED   Haemophilus influenzae NOT DETECTED NOT DETECTED   Neisseria meningitidis NOT DETECTED NOT DETECTED   Pseudomonas aeruginosa NOT DETECTED NOT DETECTED   Candida albicans NOT DETECTED NOT DETECTED   Candida glabrata NOT DETECTED NOT DETECTED   Candida krusei NOT DETECTED NOT DETECTED   Candida parapsilosis NOT DETECTED NOT DETECTED   Candida tropicalis NOT DETECTED NOT DETECTED    Name of physician (or Provider) Contacted: Dr. Arbutus Leasat  Changes to prescribed antibiotics required: 1/2 methicillin resistant Staph (not S aureus). Likely contaminant. Patient is on empiric therapy with Vancomycin and Zosyn. No changes needed at this time.   Allie BossierApryl Anderson, PharmD PGY1 Pharmacy Resident [phone] (Pager) 06/03/2016 11:21 AM

## 2016-06-03 NOTE — H&P (Signed)
History and Physical    Lisa [name] ZOX:[id number] DOB: [birth year] DOA: 06/02/2016  Referring MD/NP/PA: Mathis Bud PA-C PCP: Laurena Slimmer, MD  Patient coming from: Home via EMS  Chief Complaint: Altered mental status   HPI: Lisa [name] is a 75 y.o. female with medical history significant of HTN, HLD, CAD s/p CABG, diastolic CHF, A. 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 She had just been discharged from rehabilitation facility 3 days prior. At baseline patient has limited mobility due to fractured ankle and needs assistance with her ADLs. Patient's help from her granddaughters. Patient's family was staying with her noted the presenting symptoms and called EMS. She was also noted to be warm to touch and O2 saturations were noted to be as low as 84% by EMS. Patient gives additional history she is more alert now and states that for the last day she has had diffuse pain and aches all over and some swelling in her legs. Denies any recent sick contacts, headache, diarrhea, vomiting, or abdominal pain.   ED Course: Upon admission patient was seen to be febrile up to 100.29F, pulse up to 105, respirations up to 25, and otherwise O2 saturation maintained on 3 1/2 L nasal cannula oxygen. Sepsis protocol was initially started with empiric antibiotics of vancomycin and Zosyn. Patient was noted to have petechial like rash of the bilateral lower extremities for which Rocephin was added. Flu swab was sent for would not be able to be resulted until in a.m. Urinalysis was negative. Chest x-ray showed low lung volumes with stable cardiomegaly and mild central vascular congestion with suspected tiny left effusion.  Review of Systems: As per HPI otherwise 10 point review of systems negative.   Past Medical History:  Diagnosis Date  . Chronic diastolic CHF (congestive  heart failure) (HCC)   . Coronary artery disease    s/p CABG  . Diabetes mellitus without complication (HCC)   . Hyperlipidemia   . Hypertension   . Morbid obesity (HCC)   . OSA (obstructive sleep apnea)    mild OSA with AHI 7.15 now on CPAP at 16cm H2O  . RBBB     Past Surgical History:  Procedure Laterality Date  . BACK SURGERY    . CARDIAC CATHETERIZATION    . CARDIAC SURGERY    . CARDIOVERSION N/A 08/03/2015   Procedure: CARDIOVERSION;  Surgeon: Vesta Mixer, MD;  Location: [facility] ENDOSCOPY;  Service: Cardiovascular;  Laterality: N/A;  . CORONARY ANGIOPLASTY WITH STENT PLACEMENT    . CORONARY ARTERY BYPASS GRAFT    . KNEE SURGERY Bilateral   . ORIF ANKLE FRACTURE Right 08/19/2014   Procedure: OPEN REDUCTION INTERNAL FIXATION (ORIF) ANKLE FRACTURE;  Surgeon: Cammy Copa, MD;  Location: [facility] OR;  Service: Orthopedics;  Laterality: Right;  . rotator cuff surgery       reports that she has quit smoking. She has never used smokeless tobacco. She reports that she does not drink alcohol or use drugs.  No Known Allergies  Family History  Problem Relation Age of Onset  . Heart attack Mother   . Heart disease Mother   . Stomach cancer Father     Prior to Admission medications   Medication Sig Start Date End Date Taking? Authorizing Provider  amLODipine (NORVASC) 5 MG tablet Take 1 tablet (5 mg total) by mouth daily. 09/20/15  Yes Quintella Reichert,  MD  apixaban (ELIQUIS) 5 MG TABS tablet Take 1 tablet (5 mg total) by mouth 2 (two) times daily. 08/03/15  Yes Quintella Reichertraci R Turner, MD  atorvastatin (LIPITOR) 10 MG tablet Take 1 tablet (10 mg total) by mouth daily at 6 PM. 05/08/16  Yes Elease EtienneAnand D Hongalgi, MD  buPROPion (WELLBUTRIN XL) 150 MG 24 hr tablet Take 150 mg by mouth daily. Reported on 08/02/2015   Yes Historical Provider, MD  etodolac (LODINE) 400 MG tablet Take 400 mg by mouth 2 (two) times daily.   Yes Historical Provider, MD  fluticasone (FLOVENT HFA) 220 MCG/ACT inhaler Inhale 2  puffs into the lungs 2 (two) times daily as needed (shortness of breath/ wheezing).   Yes Historical Provider, MD  furosemide (LASIX) 20 MG tablet Take 40 mg by mouth daily.   Yes Historical Provider, MD  gabapentin (NEURONTIN) 600 MG tablet Take 1 tablet (600 mg total) by mouth 2 (two) times daily. 05/08/16  Yes Elease EtienneAnand D Hongalgi, MD  ipratropium-albuterol (DUONEB) 0.5-2.5 (3) MG/3ML SOLN Take 3 mLs by nebulization every 6 (six) hours as needed. Wheezing or shortness of breath. Patient taking differently: Take 3 mLs by nebulization every 6 (six) hours as needed (shortness of breath/ wheezing).  05/08/16  Yes Elease EtienneAnand D Hongalgi, MD  metoprolol succinate (TOPROL XL) 25 MG 24 hr tablet Take 1 tablet (25 mg total) by mouth daily. 05/03/16  Yes Quintella Reichertraci R Turner, MD  omeprazole (PRILOSEC) 20 MG capsule Take 20 mg by mouth daily.   Yes Historical Provider, MD  OXYGEN Inhale 2 L into the lungs continuous.   Yes Historical Provider, MD  traMADol (ULTRAM) 50 MG tablet Take 50 mg by mouth every 12 (twelve) hours.   Yes Historical Provider, MD  esomeprazole (NEXIUM) 40 MG capsule Take 1 capsule (40 mg total) by mouth daily before breakfast. Patient not taking: Reported on 06/02/2016 05/03/16   Quintella Reichertraci R Turner, MD    Physical Exam:    Constitutional: Elderly obese elderly female who appears sick, but non toxic at this time able to follow commands. Vitals:   06/02/16 2130 06/02/16 2145 06/02/16 2200 06/02/16 2215  BP: 130/81 149/75 140/62 141/83  Pulse:  (!) 59    Resp: 19 20 19 19   Temp:      TempSrc:      SpO2: 96%      Eyes: PERRL, lids and conjunctivae normal ENMT: Mucous membranes are moist. Posterior pharynx clear of any exudate or lesions. Neck: normal, supple, no masses, no thyromegaly Respiratory: Decreased overall aeration, no wheezing, no crackles. Normal respiratory effort. No accessory muscle use.  Cardiovascular: Regular rate and rhythm, no murmurs / rubs / gallops. + 1 pitting lower  extremity edema. 2+ pedal pulses. No carotid bruits.  Abdomen: no tenderness, no masses palpated. No hepatosplenomegaly. Bowel sounds positive.  Musculoskeletal: no clubbing / cyanosis. No joint deformity upper and lower extremities. Good ROM, no contractures. Normal muscle tone.  Skin: no rashes, lesions, ulcers. No induration Neurologic: CN 2-12 grossly intact. Sensation intact, DTR normal. Strength 5/5 in all 4.  Psychiatric: Normal judgment and insight. Alert and oriented x 3. Normal mood.     Labs on Admission: I have personally reviewed following labs and imaging studies  CBC:  Recent Labs Lab 06/02/16 1914  WBC 10.6*  NEUTROABS 8.2*  HGB 9.1*  HCT 27.8*  MCV 96.2  PLT 174   Basic Metabolic Panel:  Recent Labs Lab 06/02/16 1914  NA 136  K 4.1  CL 100*  CO2 24  GLUCOSE 113*  BUN 20  CREATININE 1.63*  CALCIUM 9.0   GFR: CrCl cannot be calculated (Unknown ideal weight.). Liver Function Tests:  Recent Labs Lab 06/02/16 1914  AST 23  ALT 13*  ALKPHOS 102  BILITOT 1.3*  PROT 6.8  ALBUMIN 2.6*   No results for input(s): LIPASE, AMYLASE in the last 168 hours. No results for input(s): AMMONIA in the last 168 hours. Coagulation Profile:  Recent Labs Lab 06/02/16 1914  INR 2.68   Cardiac Enzymes: No results for input(s): CKTOTAL, CKMB, CKMBINDEX, TROPONINI in the last 168 hours. BNP (last 3 results) No results for input(s): PROBNP in the last 8760 hours. HbA1C: No results for input(s): HGBA1C in the last 72 hours. CBG: No results for input(s): GLUCAP in the last 168 hours. Lipid Profile: No results for input(s): CHOL, HDL, LDLCALC, TRIG, CHOLHDL, LDLDIRECT in the last 72 hours. Thyroid Function Tests: No results for input(s): TSH, T4TOTAL, FREET4, T3FREE, THYROIDAB in the last 72 hours. Anemia Panel: No results for input(s): VITAMINB12, FOLATE, FERRITIN, TIBC, IRON, RETICCTPCT in the last 72 hours. Urine analysis:    Component Value Date/Time    COLORURINE YELLOW 06/02/2016 2235   APPEARANCEUR CLEAR 06/02/2016 2235   LABSPEC 1.012 06/02/2016 2235   PHURINE 7.5 06/02/2016 2235   GLUCOSEU NEGATIVE 06/02/2016 2235   HGBUR SMALL (A) 06/02/2016 2235   BILIRUBINUR NEGATIVE 06/02/2016 2235   KETONESUR NEGATIVE 06/02/2016 2235   PROTEINUR 100 (A) 06/02/2016 2235   UROBILINOGEN 1.0 08/20/2014 1130   NITRITE NEGATIVE 06/02/2016 2235   LEUKOCYTESUR NEGATIVE 06/02/2016 2235   Sepsis Labs: No results found for this or any previous visit (from the past 240 hour(s)).   Radiological Exams on Admission: Dg Chest 2 View  Result Date: 06/02/2016 CLINICAL DATA:  Trouble breathing today, weakness EXAM: CHEST  2 VIEW COMPARISON:  05/05/2016 FINDINGS: Median sternotomy wires are again evident. Discoid atelectasis in the right mid lung zone. Linear atelectasis in the left upper lobe. Low lung volumes. Suspect tiny left pleural effusion. Mild to moderate cardiomegaly with mild central congestion. No pneumothorax. Partially visualized spinal hardware. IMPRESSION: 1. Low lung volumes. 2. Linear atelectasis in the right lower lung zone. Linear atelectasis in the left upper lobe. 3. Stable cardiomegaly. Mild central vascular congestion. Suspect tiny left effusion. Electronically Signed   By: Jasmine Pang M.D.   On: 06/02/2016 23:42    EKG: Independently reviewed. Atrial fibrillation  Assessment/Plan Sepsis, unknown source: Acute. Patient presents with fever up to 100.3. Chest x-ray showing linear atelectasis in the left upper lobe - Admit to telemetry bed - Follow-up blood cultures and pending Flu swab - Empiric treatment with vancomycin, Zosyn, and Tamiflu continued de-escalate when able    Acute on chronic hypoxic respiratory failure/suspected diastolic CHF exacerbation: Acute patient noted to have decreased O2 saturations on EMS arrival and currently requiring higher oxygen rate. Patient found have BNP of 757.9. Of note patient had already been given  fluid bolus per sepsis protocol of 3.7 L Patient followed by Carolanne Grumbling of cardiology last EF was noted to be 50-55% with grade 1 diastolic dysfunction on 10/29 0981 - Continuous pulse oximetry with nasal cannula oxygen and keep O2 sats greater than 92% - Heart failure protocol initiated - strict I's and O's  - Lasix 40 IV BID, adjust dose as needed - Continue metoprolol - May want to notify patient's cardiologist in am  Atrial fibrillation on chronic anticoagulation therapy - Continue Eliquis   Rash of  feet: Acute. Patient with new petechial appearing rash of the most notably on the left foot ED physician checked a fibrinogen level which was found to be elevated. Questioning how this fits as patient does not have thrombocytopenia and elevated INRs can be explained by Eliquis. - Check RPR - Monitor for spreading   Essential hypertension - Continue amlodipine and other blood pressure medications as seen above  Osteoarthritis - Did not restart etodolac  Hyperlipidemia - Continue atorvastatin   OSA on CPAP: patient reports need of face mask for home CPAP machine.  - RT to supply CPAP  GERD - Pharmacy substitution of Protonix for omeprazole  DVT prophylaxis: On Elquis Code Status: Full Family Communication: No family present at bedside Disposition Plan:  likely discharge home once medically stable  Consults called: None Admission status: Inpatient telemetry  Clydie Braunondell A Smith MD [facility] Pager [phone]  If 7PM-7AM, please contact night-coverage [email or web address] Password [email or web address]  06/03/2016, 12:22 AM

## 2016-06-03 NOTE — Progress Notes (Addendum)
PROGRESS NOTE  Lisa SchneidersGrace A [name] ZOX:[id number] DOB: 05/05/1941 DOA: 06/02/2016 PCP: [name],[name], MD  Brief History:  75 year old female with a history of hypertension, hyperlipidemia, diastolic CHF, atrial fibrillation, coronary artery disease status post CABG, diabetes mellitus type 2, morbid obesity, chronic respiratory failure on 2 L presented from home secondary to somnolence, lethargy, and increasing shortness of breath. The patient was recently discharged from a skilled nursing facility 3 days prior to this admission and has been living at home with her family. Upon arrival, EMS noted that the patient's options saturations were 84% on room air. The patient is a poor historian, but family states that that she has had myalgias and arthralgias for the past 2-3 days prior to this admission. Upon presentation, the patient was noted to have temperature 100.56F with heart rate 105 requiring up to 4 L nasal cannula to maintain adequate oxygen saturation. There was concern for pulmonary infection; therefore, the patient was started on intravenous Zosyn and vancomycin.  Assessment/Plan: Acute on chronic respiratory failure with hypoxia -Secondary to decompensated CHF -Presently stable on 4 L nasal cannula -At baseline, patient is on 2 L nasal cannula -Wean oxygen as tolerated back to baseline -patient received 3.7 L as part of the sepsis protocol -Lactic acid 1.38 -check PCT -check flu PCR--neg  -viral respiratory panel  Acute on chronic diastolic CHF -Unfortunately, the patient is a poor historian and there is no family available presently -During her office visit 05/03/2016, the patient's weight was 232 pounds -Admission weight 254 pounds -Start intravenous furosemide -Daily weights -Certainly, the patient's albumin may be contributing to a degree of third spacing  Fever -flu--neg -check viral respiratory panel -UA neg for pyria -CXR-no consolidation  Persistent Atrial  fibrillation -Continue apixaban -Rate controlled -Continue metoprolol succinate -CHADSVASc = 6 (CHF, HTN, Age, CAD, Female)  Acute Encephalopathy -multifactorial including possible infection, decompensated CHF and acute on chronic renal failure -check B12 -TSH -ammonia  Acute on chronic renal failure--CKD stage III -Baseline creatinine 1.1-1.4 -Monitor with diuresis  Hypertension -Anticipate improvement with diuresis -Continue amlodipine and metoprolol succinate  Foot rash -This appears to be ecchymosis on examination--nonblanching -Suspect the patient had recent trauma in the setting of apixiban use -No signs of infection  OSA  -- the patient is tolerating PAP therapy well without any problems. The PAP download was reviewed today and showed an AHI of 1.4/hr on 16 cm H2O with 80% compliance in using more than 4 hours nightly  Hyperlipidemia -continue statin   Disposition Plan:   Home in 2-3 days  Family Communication:  No  Family at bedside--Total time spent 35 minutes.  Greater than 50% spent face to face counseling and coordinating care. 0825 to 0900   Consultants:  none  Code Status:  FULL  DVT Prophylaxis: apixaban   Procedures: As Listed in Progress Note Above  Antibiotics: None    Subjective: Patient is a poor historian. She does complain of shortness of breath and "pain all over". Denies any fevers, chills, chest pain, abdominal pain. No dysuria.  Objective: Vitals:   06/03/16 0100 06/03/16 0130 06/03/16 0200 06/03/16 0328  BP: 118/80 151/95 (!) 157/107 (!) 153/62  Pulse:    75  Resp: 23 19 17 20   Temp:    97.9 F (36.6 C)  TempSrc:    Oral  SpO2:  98%  98%  Weight:    115.4 kg (254 lb 6.4 oz)    Intake/Output Summary (Last  24 hours) at 06/03/16 0852 Last data filed at 06/03/16 0500  Gross per 24 hour  Intake             3970 ml  Output              100 ml  Net             3870 ml   Weight change:  Exam:   General:  Pt is alert,  follows commands appropriately, not in acute distress  HEENT: No icterus, No thrush, No neck mass, [place]/AT  Cardiovascular: IRRR, S1/S2, no rubs, no gallops  Respiratory: Poor respiratory effort. Bibasilar crackles.  Abdomen: Soft/+BS, non tender, non distended, no guarding  Extremities:  1+ LE edema, No lymphangitis, No petechiae, No rashes, no synovitis   Data Reviewed: I have personally reviewed following labs and imaging studies Basic Metabolic Panel:  Recent Labs Lab 06/02/16 1914 06/03/16 0511  NA 136 136  K 4.1 4.0  CL 100* 101  CO2 24 24  GLUCOSE 113* 114*  BUN 20 20  CREATININE 1.63* 1.47*  CALCIUM 9.0 8.5*   Liver Function Tests:  Recent Labs Lab 06/02/16 1914 06/03/16 0511  AST 23 28  ALT 13* 12*  ALKPHOS 102 87  BILITOT 1.3* 1.3*  PROT 6.8 6.5  ALBUMIN 2.6* 2.4*   No results for input(s): LIPASE, AMYLASE in the last 168 hours. No results for input(s): AMMONIA in the last 168 hours. Coagulation Profile:  Recent Labs Lab 06/02/16 1914  INR 2.68   CBC:  Recent Labs Lab 06/02/16 1914 06/03/16 0511  WBC 10.6* 11.0*  NEUTROABS 8.2*  --   HGB 9.1* 8.4*  HCT 27.8* 26.1*  MCV 96.2 96.7  PLT 174 182   Cardiac Enzymes: No results for input(s): CKTOTAL, CKMB, CKMBINDEX, TROPONINI in the last 168 hours. BNP: Invalid input(s): POCBNP CBG:  Recent Labs Lab 06/03/16 0312  GLUCAP 95   HbA1C: No results for input(s): HGBA1C in the last 72 hours. Urine analysis:    Component Value Date/Time   COLORURINE YELLOW 06/02/2016 2235   APPEARANCEUR CLEAR 06/02/2016 2235   LABSPEC 1.012 06/02/2016 2235   PHURINE 7.5 06/02/2016 2235   GLUCOSEU NEGATIVE 06/02/2016 2235   HGBUR SMALL (A) 06/02/2016 2235   BILIRUBINUR NEGATIVE 06/02/2016 2235   KETONESUR NEGATIVE 06/02/2016 2235   PROTEINUR 100 (A) 06/02/2016 2235   UROBILINOGEN 1.0 08/20/2014 1130   NITRITE NEGATIVE 06/02/2016 2235   LEUKOCYTESUR NEGATIVE 06/02/2016 2235   Sepsis  Labs: @LABRCNTIP (procalcitonin:4,lacticidven:4) )No results found for this or any previous visit (from the past 240 hour(s)).   Scheduled Meds: . amLODipine  5 mg Oral Daily  . apixaban  5 mg Oral BID  . atorvastatin  10 mg Oral q1800  . buPROPion  150 mg Oral Daily  . furosemide  40 mg Intravenous BID  . gabapentin  600 mg Oral BID  . metoprolol succinate  25 mg Oral Daily  . oseltamivir  30 mg Oral BID  . pantoprazole  40 mg Oral Daily  . piperacillin-tazobactam (ZOSYN)  IV  3.375 g Intravenous Q8H  . sodium chloride flush  3 mL Intravenous Q12H  . traMADol  50 mg Oral Q12H  . vancomycin  1,250 mg Intravenous Q24H   Continuous Infusions:  Procedures/Studies: Dg Chest 2 View  Result Date: 06/02/2016 CLINICAL DATA:  Trouble breathing today, weakness EXAM: CHEST  2 VIEW COMPARISON:  05/05/2016 FINDINGS: Median sternotomy wires are again evident. Discoid atelectasis in the right mid lung  zone. Linear atelectasis in the left upper lobe. Low lung volumes. Suspect tiny left pleural effusion. Mild to moderate cardiomegaly with mild central congestion. No pneumothorax. Partially visualized spinal hardware. IMPRESSION: 1. Low lung volumes. 2. Linear atelectasis in the right lower lung zone. Linear atelectasis in the left upper lobe. 3. Stable cardiomegaly. Mild central vascular congestion. Suspect tiny left effusion. Electronically Signed   By: Jasmine Pang M.D.   On: 06/02/2016 23:42   Dg Chest 2 View  Result Date: 05/05/2016 CLINICAL DATA:  Shortness of breath. EXAM: CHEST  2 VIEW COMPARISON:  May 04, 2016 FINDINGS: Linear density in the right mid lung may represent scar or atelectasis versus fluid in the minor fissure. Stable cardiomegaly. No pneumothorax. No overt edema. No other acute abnormalities. IMPRESSION: No acute interval change. Electronically Signed   By: Gerome Sam III M.D   On: 05/05/2016 18:19   Ct Angio Chest Pe W Or Wo Contrast  Result Date: 05/05/2016 CLINICAL  DATA:  Lambert Mody mid chest pain. Elevated D-dimer. Nausea and vomiting. EXAM: CT ANGIOGRAPHY CHEST CT ABDOMEN AND PELVIS WITH CONTRAST TECHNIQUE: Multidetector CT imaging of the chest was performed using the standard protocol during bolus administration of intravenous contrast. Multiplanar CT image reconstructions and MIPs were obtained to evaluate the vascular anatomy. Multidetector CT imaging of the abdomen and pelvis was performed using the standard protocol during bolus administration of intravenous contrast. CONTRAST:  80 cc of Isovue 370 COMPARISON:  August 28, 2009 FINDINGS: CTA CHEST FINDINGS Cardiovascular: The thoracic aorta demonstrates atherosclerotic change but no aneurysm or dissection. No pleural or pericardial effusions. Cardiomegaly is identified. There are coronary artery calcifications. Respiratory motion, particularly in the bases, limits evaluation of the pulmonary arteries. Taking respiratory motion into account, no convincing evidence of pulmonary emboli identified. Mediastinum/Nodes: No adenopathy.  No effusions. Lungs/Pleura: The central airways are normal. No pneumothorax. Linear opacities in the lungs most consistent with scar or atelectasis. Respiratory motion limits evaluation, particularly of the lower lobes. No suspicious nodules or masses. No focal infiltrates. Musculoskeletal: Degenerative changes in the thoracic spine. No acute bony abnormalities. Previous sternotomy. Review of the MIP images confirms the above findings. CT ABDOMEN and PELVIS FINDINGS Hepatobiliary: Evaluation is limited due to respiratory motion. The patient is status post cholecystectomy. No suspicious focal masses identified. Limited views of the portal vein are unremarkable. Pancreas: Unremarkable. No pancreatic ductal dilatation or surrounding inflammatory changes. Spleen: Evaluation is limited due to respiratory motion. No abnormalities. Adrenals/Urinary Tract: Multiple cysts are associated with the right  kidney. No suspicious masses. An extra renal pelvis is associated with the left kidney. No hydronephrosis or suspicious mass. No ureterectasis or ureteral stones. The adrenal glands are normal in appearance. Stomach/Bowel: The distal esophagus and stomach are normal. The small bowel is unremarkable. A few scattered colonic diverticuli are seen without diverticulitis. Nondependent portions of colon are air-filled and mildly prominent but not dilated by CT criteria. No obstruction. No acute colonic abnormalities. The appendix is well seen on coronal images with no appendicitis. Vascular/Lymphatic: Atherosclerotic changes are seen in the non aneurysmal aorta. No dissection. Atherosclerotic changes extend into the iliac and femoral vessels. No adenopathy. Reproductive: The patient is status post hysterectomy. No suspicious adnexal masses. The ovaries appear to have been retained. Other: There is a fat containing umbilical hernia. Musculoskeletal: Postsurgical changes are seen in the lower lumbar spine. Degenerative change are identified. Review of the MIP images confirms the above findings. IMPRESSION: 1. No pulmonary emboli.  Scattered atelectasis in the lungs.  2. No acute abnormalities in the abdomen to explain the patient's symptoms. 3. Atherosclerotic change in the aorta, iliac vessels, and femoral vessels. Electronically Signed   By: Gerome Sam III M.D   On: 05/05/2016 17:15   Ct Abdomen Pelvis W Contrast  Result Date: 05/05/2016 CLINICAL DATA:  Lambert Mody mid chest pain. Elevated D-dimer. Nausea and vomiting. EXAM: CT ANGIOGRAPHY CHEST CT ABDOMEN AND PELVIS WITH CONTRAST TECHNIQUE: Multidetector CT imaging of the chest was performed using the standard protocol during bolus administration of intravenous contrast. Multiplanar CT image reconstructions and MIPs were obtained to evaluate the vascular anatomy. Multidetector CT imaging of the abdomen and pelvis was performed using the standard protocol during bolus  administration of intravenous contrast. CONTRAST:  80 cc of Isovue 370 COMPARISON:  August 28, 2009 FINDINGS: CTA CHEST FINDINGS Cardiovascular: The thoracic aorta demonstrates atherosclerotic change but no aneurysm or dissection. No pleural or pericardial effusions. Cardiomegaly is identified. There are coronary artery calcifications. Respiratory motion, particularly in the bases, limits evaluation of the pulmonary arteries. Taking respiratory motion into account, no convincing evidence of pulmonary emboli identified. Mediastinum/Nodes: No adenopathy.  No effusions. Lungs/Pleura: The central airways are normal. No pneumothorax. Linear opacities in the lungs most consistent with scar or atelectasis. Respiratory motion limits evaluation, particularly of the lower lobes. No suspicious nodules or masses. No focal infiltrates. Musculoskeletal: Degenerative changes in the thoracic spine. No acute bony abnormalities. Previous sternotomy. Review of the MIP images confirms the above findings. CT ABDOMEN and PELVIS FINDINGS Hepatobiliary: Evaluation is limited due to respiratory motion. The patient is status post cholecystectomy. No suspicious focal masses identified. Limited views of the portal vein are unremarkable. Pancreas: Unremarkable. No pancreatic ductal dilatation or surrounding inflammatory changes. Spleen: Evaluation is limited due to respiratory motion. No abnormalities. Adrenals/Urinary Tract: Multiple cysts are associated with the right kidney. No suspicious masses. An extra renal pelvis is associated with the left kidney. No hydronephrosis or suspicious mass. No ureterectasis or ureteral stones. The adrenal glands are normal in appearance. Stomach/Bowel: The distal esophagus and stomach are normal. The small bowel is unremarkable. A few scattered colonic diverticuli are seen without diverticulitis. Nondependent portions of colon are air-filled and mildly prominent but not dilated by CT criteria. No  obstruction. No acute colonic abnormalities. The appendix is well seen on coronal images with no appendicitis. Vascular/Lymphatic: Atherosclerotic changes are seen in the non aneurysmal aorta. No dissection. Atherosclerotic changes extend into the iliac and femoral vessels. No adenopathy. Reproductive: The patient is status post hysterectomy. No suspicious adnexal masses. The ovaries appear to have been retained. Other: There is a fat containing umbilical hernia. Musculoskeletal: Postsurgical changes are seen in the lower lumbar spine. Degenerative change are identified. Review of the MIP images confirms the above findings. IMPRESSION: 1. No pulmonary emboli.  Scattered atelectasis in the lungs. 2. No acute abnormalities in the abdomen to explain the patient's symptoms. 3. Atherosclerotic change in the aorta, iliac vessels, and femoral vessels. Electronically Signed   By: Gerome Sam III M.D   On: 05/05/2016 17:15   US Renal  Result Date: 05/07/2016 CLINICAL DATA:  75 year old hypertensive diabetic female with renal failure. Subsequent encounter. EXAM: RENAL / URINARY TRACT ULTRASOUND COMPLETE COMPARISON:  05/05/2016 CT. FINDINGS: Right Kidney: Length: 10.3 cm. No hydronephrosis. Upper pole 2.6 cm cyst and 5.8 cm cyst mid aspect. Left Kidney: Length: 11 cm.  Extra renal pelvis without hydronephrosis. Bladder: Appears normal for degree of bladder distention. IMPRESSION: No hydronephrosis.  Extrarenal pelvis on the left. Two  right renal cysts measuring up to 5.8 cm. Electronically Signed   By: Lacy Duverney M.D.   On: 05/07/2016 13:35   Dg Chest Port 1 View  Result Date: 05/04/2016 CLINICAL DATA:  Chest pain, shortness of breath x2 months EXAM: PORTABLE CHEST 1 VIEW COMPARISON:  10/01/2009 FINDINGS: Low lung volumes. Platelike scarring/ atelectasis in the right lower lung. No pleural effusion or pneumothorax. Cardiomegaly.  Postsurgical changes related to prior CABG. IMPRESSION: Low lung volumes with  platelike scarring/ atelectasis in the right lower lung. These results will be called to the ordering clinician or representative by the Radiologist Assistant, and communication documented in the PACS or zVision Dashboard. Electronically Signed   By: Charline Bills M.D.   On: 05/04/2016 18:36    Elverna Caffee, DO  [facility] Pager [phone]  If 7PM-7AM, please contact night-coverage [email or web address] Password [email or web address] 06/03/2016, 8:52 AM   LOS: 0 days

## 2016-06-03 NOTE — ED Notes (Signed)
Admitting at bedside 

## 2016-06-03 NOTE — Progress Notes (Signed)
Pt refuse NIV for the night. NT at bedside cleaning up patient.

## 2016-06-04 LAB — AMMONIA: Ammonia: 22 umol/L (ref 9–35)

## 2016-06-04 LAB — MAGNESIUM: MAGNESIUM: 1.4 mg/dL — AB (ref 1.7–2.4)

## 2016-06-04 LAB — BASIC METABOLIC PANEL
Anion gap: 10 (ref 5–15)
BUN: 23 mg/dL — AB (ref 6–20)
CALCIUM: 8.7 mg/dL — AB (ref 8.9–10.3)
CO2: 26 mmol/L (ref 22–32)
Chloride: 100 mmol/L — ABNORMAL LOW (ref 101–111)
Creatinine, Ser: 1.6 mg/dL — ABNORMAL HIGH (ref 0.44–1.00)
GFR calc Af Amer: 35 mL/min — ABNORMAL LOW (ref >60)
GFR, EST NON AFRICAN AMERICAN: 30 mL/min — AB (ref >60)
Glucose, Bld: 90 mg/dL (ref 65–99)
POTASSIUM: 3.8 mmol/L (ref 3.5–5.1)
SODIUM: 136 mmol/L (ref 135–145)

## 2016-06-04 LAB — TSH: TSH: 7.344 u[IU]/mL — AB (ref 0.350–4.500)

## 2016-06-04 LAB — VITAMIN B12: Vitamin B-12: 406 pg/mL (ref 180–914)

## 2016-06-04 MED ORDER — FUROSEMIDE 40 MG PO TABS
40.0000 mg | ORAL_TABLET | Freq: Every day | ORAL | Status: DC
  Administered 2016-06-04 – 2016-06-10 (×6): 40 mg via ORAL
  Filled 2016-06-04 (×7): qty 1

## 2016-06-04 NOTE — Progress Notes (Addendum)
PROGRESS NOTE  Lisa [name] ZOX:[id number] DOB: 03-Mar-1941 DOA: 06/02/2016 PCP: Laurena Slimmer, MD   Brief History:  75 year old female with a history of hypertension, hyperlipidemia, diastolic CHF, atrial fibrillation, coronary artery disease status post CABG, diabetes mellitus type 2, morbid obesity, chronic respiratory failure on 2 L presented from home secondary to somnolence, lethargy, and increasing shortness of breath. The patient was recently discharged from a skilled nursing facility 3 days prior to this admission and has been living at home with her family. Upon arrival, EMS noted that the patient's options saturations were 84% on room air. The patient is a poor historian, but family states that that she has had myalgias and arthralgias for the past 2-3 days prior to this admission. Upon presentation, the patient was noted to have temperature 100.33F with heart rate 105 requiring up to 4 L nasal cannula to maintain adequate oxygen saturation. There was concern for pulmonary infection; therefore, the patient was started on intravenous Zosyn and vancomycin. As the patient improved clinically and remained afebrile, antibiotics were discontinued on 06/04/2016. The patient was started on IV furosemide with good clinical effect.  Assessment/Plan: Acute on chronic respiratory failure with hypoxia -Secondary to decompensated CHF -Presently stable on 4 L nasal cannula-->2L -At baseline, patient is on 2 L nasal cannula -Wean oxygen as tolerated back to baseline -patient received 3.7 L as part of the sepsis protocol -Lactic acid 1.38 -check PCT--0.40 -check flu PCR--neg  -viral respiratory panel--neg  Acute on chronic diastolic CHF -Unfortunately, the patient is a poor historian and there is no family available presently -During her office visit 05/03/2016, the patient's weight was 232 pounds -Admission weight 254 pounds -Started intravenous furosemide-->switch to po lasix  11/28 pm -Daily weights--NEG 6 lbs -I/O not accurate -Certainly, the patient's albumin may be contributing to a degree of third spacing  Fever -sepsis ruled out -flu--neg -check viral respiratory panel -UA neg for pyria -CXR-no consolidation  Persistent Atrial fibrillation -Continue apixaban -Rate controlled -Continue metoprolol succinate -CHADSVASc = 6 (CHF, HTN, Age, CAD, Female)  Acute Encephalopathy -multifactorial including possible infection, decompensated CHF and acute on chronic renal failure -check B12--406 -TSH--7.344-->check free T4 in am -ammonia--22 -improving, close to baseline 11/28 per daughter  Acute on chronic renal failure--CKD stage III -Baseline creatinine 1.1-1.4 -Monitor with diuresis  Hypertension -Improvement with diuresis -Continue metoprolol succinate -d/c amlodpine as BP dropping with diuresis  Foot rash -This appears to be ecchymosis on examination--nonblanching -Suspect the patient had recent trauma in the setting of apixiban use -No signs of infection  OSA  -- the patient is tolerating PAP therapy well without any problems at home.   Hyperlipidemia -continue statin   Disposition Plan:   Home 11/29 if stable  Family Communication:   Daughter updated at bedside 11/28   Consultants:  none  Code Status:  FULL  DVT Prophylaxis: apixaban   Procedures: As Listed in Progress Note Above  Antibiotics: None    Subjective: Patient denies fevers, chills, headache, chest pain, dyspnea, nausea, vomiting, diarrhea, abdominal pain, dysuria, hematuria, hematochezia, and melena.   Objective: Vitals:   06/03/16 1937 06/04/16 0004 06/04/16 0616 06/04/16 1113  BP: (!) 130/54 (!) 111/59 125/62 (!) 103/48  Pulse: 73 72 75 74  Resp: 20 18 20 18   Temp: 98.6 F (37 C) 98.1 F (36.7 C) 98.3 F (36.8 C) 98 F (36.7 C)  TempSrc: Oral Oral Oral Oral  SpO2: 100% 97% 100% 95%  Weight:  112.9 kg (248 lb 14.4 oz)      Intake/Output Summary (Last 24 hours) at 06/04/16 1855 Last data filed at 06/04/16 1300  Gross per 24 hour  Intake              780 ml  Output                0 ml  Net              780 ml   Weight change: -2.495 kg (-5 lb 8 oz) Exam:   General:  Pt is alert, follows commands appropriately, not in acute distress  HEENT: No icterus, No thrush, No neck mass, [place]/AT  Cardiovascular: RRR, S1/S2, no rubs, no gallops  Respiratory: Diminished breath sounds in the bases. Poor inspiratory effort. No wheezing. Good air movement.  Abdomen: Soft/+BS, non tender, non distended, no guarding  Extremities: No edema, No lymphangitis, No petechiae, No rashes, no synovitis   Data Reviewed: I have personally reviewed following labs and imaging studies Basic Metabolic Panel:  Recent Labs Lab 06/02/16 1914 06/03/16 0511 06/04/16 0326  NA 136 136 136  K 4.1 4.0 3.8  CL 100* 101 100*  CO2 24 24 26   GLUCOSE 113* 114* 90  BUN 20 20 23*  CREATININE 1.63* 1.47* 1.60*  CALCIUM 9.0 8.5* 8.7*  MG  --   --  1.4*   Liver Function Tests:  Recent Labs Lab 06/02/16 1914 06/03/16 0511  AST 23 28  ALT 13* 12*  ALKPHOS 102 87  BILITOT 1.3* 1.3*  PROT 6.8 6.5  ALBUMIN 2.6* 2.4*   No results for input(s): LIPASE, AMYLASE in the last 168 hours.  Recent Labs Lab 06/04/16 0326  AMMONIA 22   Coagulation Profile:  Recent Labs Lab 06/02/16 1914  INR 2.68   CBC:  Recent Labs Lab 06/02/16 1914 06/03/16 0511  WBC 10.6* 11.0*  NEUTROABS 8.2*  --   HGB 9.1* 8.4*  HCT 27.8* 26.1*  MCV 96.2 96.7  PLT 174 182   Cardiac Enzymes: No results for input(s): CKTOTAL, CKMB, CKMBINDEX, TROPONINI in the last 168 hours. BNP: Invalid input(s): POCBNP CBG:  Recent Labs Lab 06/03/16 0312  GLUCAP 95   HbA1C: No results for input(s): HGBA1C in the last 72 hours. Urine analysis:    Component Value Date/Time   COLORURINE YELLOW 06/02/2016 2235   APPEARANCEUR CLEAR 06/02/2016 2235    LABSPEC 1.012 06/02/2016 2235   PHURINE 7.5 06/02/2016 2235   GLUCOSEU NEGATIVE 06/02/2016 2235   HGBUR SMALL (A) 06/02/2016 2235   BILIRUBINUR NEGATIVE 06/02/2016 2235   KETONESUR NEGATIVE 06/02/2016 2235   PROTEINUR 100 (A) 06/02/2016 2235   UROBILINOGEN 1.0 08/20/2014 1130   NITRITE NEGATIVE 06/02/2016 2235   LEUKOCYTESUR NEGATIVE 06/02/2016 2235   Sepsis Labs: @LABRCNTIP (procalcitonin:4,lacticidven:4) ) Recent Results (from the past 240 hour(s))  Blood culture (routine x 2)     Status: Abnormal (Preliminary result)   Collection Time: 06/02/16  2:13 PM  Result Value Ref Range Status   Specimen Description BLOOD RIGHT ANTECUBITAL  Final   Special Requests BOTTLES DRAWN AEROBIC AND ANAEROBIC 5CC  Final   Culture  Setup Time   Final    GRAM POSITIVE COCCI IN CLUSTERS IN BOTH AEROBIC AND ANAEROBIC BOTTLES CRITICAL RESULT CALLED TO, READ BACK BY AND VERIFIED WITH: A. Reece LeaderAnderson Pharm.D. 11:15 06/03/16  ([email or web address])    Culture (A)  Final    STAPHYLOCOCCUS SPECIES (COAGULASE NEGATIVE) THE SIGNIFICANCE OF ISOLATING THIS ORGANISM FROM A SINGLE SET  OF BLOOD CULTURES WHEN MULTIPLE SETS ARE DRAWN IS UNCERTAIN. PLEASE NOTIFY THE MICROBIOLOGY DEPARTMENT WITHIN ONE WEEK IF SPECIATION AND SENSITIVITIES ARE REQUIRED.    Report Status PENDING  Incomplete  Blood Culture ID Panel (Reflexed)     Status: Abnormal   Collection Time: 06/02/16  2:13 PM  Result Value Ref Range Status   Enterococcus species NOT DETECTED NOT DETECTED Final   Listeria monocytogenes NOT DETECTED NOT DETECTED Final   Staphylococcus species DETECTED (A) NOT DETECTED Final    Comment: CRITICAL RESULT CALLED TO, READ BACK BY AND VERIFIED WITH: A. Reece LeaderAnderson Pharm.D. 11:15 06/03/16 ([email or web address])    Staphylococcus aureus NOT DETECTED NOT DETECTED Final   Methicillin resistance DETECTED (A) NOT DETECTED Final    Comment: CRITICAL RESULT CALLED TO, READ BACK BY AND VERIFIED WITH: A. Reece LeaderAnderson Pharm.D. 11:15 06/03/16 ([email or web address])     Streptococcus species NOT DETECTED NOT DETECTED Final   Streptococcus agalactiae NOT DETECTED NOT DETECTED Final   Streptococcus pneumoniae NOT DETECTED NOT DETECTED Final   Streptococcus pyogenes NOT DETECTED NOT DETECTED Final   Acinetobacter baumannii NOT DETECTED NOT DETECTED Final   Enterobacteriaceae species NOT DETECTED NOT DETECTED Final   Enterobacter cloacae complex NOT DETECTED NOT DETECTED Final   Escherichia coli NOT DETECTED NOT DETECTED Final   Klebsiella oxytoca NOT DETECTED NOT DETECTED Final   Klebsiella pneumoniae NOT DETECTED NOT DETECTED Final   Proteus species NOT DETECTED NOT DETECTED Final   Serratia marcescens NOT DETECTED NOT DETECTED Final   Haemophilus influenzae NOT DETECTED NOT DETECTED Final   Neisseria meningitidis NOT DETECTED NOT DETECTED Final   Pseudomonas aeruginosa NOT DETECTED NOT DETECTED Final   Candida albicans NOT DETECTED NOT DETECTED Final   Candida glabrata NOT DETECTED NOT DETECTED Final   Candida krusei NOT DETECTED NOT DETECTED Final   Candida parapsilosis NOT DETECTED NOT DETECTED Final   Candida tropicalis NOT DETECTED NOT DETECTED Final  Blood culture (routine x 2)     Status: None (Preliminary result)   Collection Time: 06/02/16  7:10 PM  Result Value Ref Range Status   Specimen Description BLOOD LEFT ANTECUBITAL  Final   Special Requests BOTTLES DRAWN AEROBIC AND ANAEROBIC 5CC  Final   Culture NO GROWTH 2 DAYS  Final   Report Status PENDING  Incomplete  Urine culture     Status: Abnormal (Preliminary result)   Collection Time: 06/02/16 10:35 PM  Result Value Ref Range Status   Specimen Description URINE, CATHETERIZED  Final   Special Requests NONE  Final   Culture MULTIPLE SPECIES PRESENT, SUGGEST RECOLLECTION (A)  Final   Report Status PENDING  Incomplete  MRSA PCR Screening     Status: None   Collection Time: 06/03/16  9:28 AM  Result Value Ref Range Status   MRSA by PCR NEGATIVE NEGATIVE Final    Comment:        The  GeneXpert MRSA Assay (FDA approved for NASAL specimens only), is one component of a comprehensive MRSA colonization surveillance program. It is not intended to diagnose MRSA infection nor to guide or monitor treatment for MRSA infections.   Respiratory Panel by PCR     Status: None   Collection Time: 06/03/16  9:43 AM  Result Value Ref Range Status   Adenovirus NOT DETECTED NOT DETECTED Final   Coronavirus 229E NOT DETECTED NOT DETECTED Final   Coronavirus HKU1 NOT DETECTED NOT DETECTED Final   Coronavirus NL63 NOT DETECTED NOT DETECTED Final   Coronavirus OC43 NOT DETECTED NOT DETECTED Final  Metapneumovirus NOT DETECTED NOT DETECTED Final   Rhinovirus / Enterovirus NOT DETECTED NOT DETECTED Final   Influenza A NOT DETECTED NOT DETECTED Final   Influenza B NOT DETECTED NOT DETECTED Final   Parainfluenza Virus 1 NOT DETECTED NOT DETECTED Final   Parainfluenza Virus 2 NOT DETECTED NOT DETECTED Final   Parainfluenza Virus 3 NOT DETECTED NOT DETECTED Final   Parainfluenza Virus 4 NOT DETECTED NOT DETECTED Final   Respiratory Syncytial Virus NOT DETECTED NOT DETECTED Final   Bordetella pertussis NOT DETECTED NOT DETECTED Final   Chlamydophila pneumoniae NOT DETECTED NOT DETECTED Final   Mycoplasma pneumoniae NOT DETECTED NOT DETECTED Final     Scheduled Meds: . amLODipine  5 mg Oral Daily  . apixaban  5 mg Oral BID  . atorvastatin  10 mg Oral q1800  . buPROPion  150 mg Oral Daily  . furosemide  40 mg Oral Daily  . gabapentin  600 mg Oral BID  . metoprolol succinate  25 mg Oral Daily  . pantoprazole  40 mg Oral Daily  . piperacillin-tazobactam (ZOSYN)  IV  3.375 g Intravenous Q8H  . sodium chloride flush  3 mL Intravenous Q12H  . traMADol  50 mg Oral Q12H  . vancomycin  1,250 mg Intravenous Q24H   Continuous Infusions:  Procedures/Studies: Dg Chest 2 View  Result Date: 06/02/2016 CLINICAL DATA:  Trouble breathing today, weakness EXAM: CHEST  2 VIEW COMPARISON:   05/05/2016 FINDINGS: Median sternotomy wires are again evident. Discoid atelectasis in the right mid lung zone. Linear atelectasis in the left upper lobe. Low lung volumes. Suspect tiny left pleural effusion. Mild to moderate cardiomegaly with mild central congestion. No pneumothorax. Partially visualized spinal hardware. IMPRESSION: 1. Low lung volumes. 2. Linear atelectasis in the right lower lung zone. Linear atelectasis in the left upper lobe. 3. Stable cardiomegaly. Mild central vascular congestion. Suspect tiny left effusion. Electronically Signed   By: Jasmine Pang M.D.   On: 06/02/2016 23:42   US Renal  Result Date: 05/07/2016 CLINICAL DATA:  75 year old hypertensive diabetic female with renal failure. Subsequent encounter. EXAM: RENAL / URINARY TRACT ULTRASOUND COMPLETE COMPARISON:  05/05/2016 CT. FINDINGS: Right Kidney: Length: 10.3 cm. No hydronephrosis. Upper pole 2.6 cm cyst and 5.8 cm cyst mid aspect. Left Kidney: Length: 11 cm.  Extra renal pelvis without hydronephrosis. Bladder: Appears normal for degree of bladder distention. IMPRESSION: No hydronephrosis.  Extrarenal pelvis on the left. Two right renal cysts measuring up to 5.8 cm. Electronically Signed   By: Lacy Duverney M.D.   On: 05/07/2016 13:35    Tomeshia Pizzi, DO  [facility] Pager [phone]  If 7PM-7AM, please contact night-coverage [email or web address] Password [email or web address] 06/04/2016, 6:55 PM   LOS: 1 day

## 2016-06-04 NOTE — Progress Notes (Signed)
Droplet precaution was discontinued, respiratory panel virus final result was negative. Will monitor accordingly.

## 2016-06-05 LAB — PROCALCITONIN: PROCALCITONIN: 0.61 ng/mL

## 2016-06-05 LAB — BASIC METABOLIC PANEL
ANION GAP: 11 (ref 5–15)
BUN: 29 mg/dL — ABNORMAL HIGH (ref 6–20)
CALCIUM: 8.3 mg/dL — AB (ref 8.9–10.3)
CO2: 26 mmol/L (ref 22–32)
CREATININE: 1.77 mg/dL — AB (ref 0.44–1.00)
Chloride: 96 mmol/L — ABNORMAL LOW (ref 101–111)
GFR calc non Af Amer: 27 mL/min — ABNORMAL LOW (ref >60)
GFR, EST AFRICAN AMERICAN: 31 mL/min — AB (ref >60)
Glucose, Bld: 84 mg/dL (ref 65–99)
Potassium: 3.5 mmol/L (ref 3.5–5.1)
SODIUM: 133 mmol/L — AB (ref 135–145)

## 2016-06-05 LAB — T4, FREE: Free T4: 1.44 ng/dL — ABNORMAL HIGH (ref 0.61–1.12)

## 2016-06-05 LAB — TSH: TSH: 4.522 u[IU]/mL — AB (ref 0.350–4.500)

## 2016-06-05 LAB — CULTURE, BLOOD (ROUTINE X 2)

## 2016-06-05 LAB — URINE CULTURE

## 2016-06-05 LAB — MAGNESIUM: MAGNESIUM: 1.4 mg/dL — AB (ref 1.7–2.4)

## 2016-06-05 MED ORDER — MAGNESIUM SULFATE 2 GM/50ML IV SOLN
2.0000 g | Freq: Once | INTRAVENOUS | Status: AC
  Administered 2016-06-05: 2 g via INTRAVENOUS
  Filled 2016-06-05: qty 50

## 2016-06-05 NOTE — Clinical Social Work Placement (Signed)
   CLINICAL SOCIAL WORK PLACEMENT  NOTE  Date:  06/05/2016  Patient Details  Name: [name] Plaisted MRN: [id number] Date of Birth: 27-Apr-1941  Clinical Social Work is seeking post-discharge placement for this patient at the Skilled  Nursing Facility level of care (*CSW will initial, date and re-position this form in  chart as items are completed):  Yes   Patient/family provided with [facility] Clinical Social Work Department's list of facilities offering this level of care within the geographic area requested by the patient (or if unable, by the patient's family).  Yes   Patient/family informed of their freedom to choose among providers that offer the needed level of care, that participate in Medicare, Medicaid or managed care program needed by the patient, have an available bed and are willing to accept the patient.  Yes   Patient/family informed of [facility], as well as of the fact that they are under no obligation to receive care at these facilities.  PASRR submitted to EDS on 06/05/16     PASRR number received on       Existing PASRR number confirmed on 06/05/16     FL2 transmitted to all facilities in geographic area requested by pt/family on       FL2 transmitted to all facilities within larger geographic area on       Patient informed that his/her managed care company has contracts with or will negotiate with certain facilities, including the following:            Patient/family informed of bed offers received.  Patient chooses bed at       Physician recommends and patient chooses bed at      Patient to be transferred to   on  .  Patient to be transferred to facility by       Patient family notified on   of transfer.  Name of family member notified:        PHYSICIAN Please sign FL2     Additional Comment:    _______________________________________________ Margarito LinerSarah C Damonique Brunelle, LCSW 06/05/2016, 1:59  PM

## 2016-06-05 NOTE — Clinical Social Work Note (Addendum)
Clinical Social Work Assessment  Patient Details  Name: Lisa [name] MRN: [id number] Date of Birth: 05-18-1941  Date of referral:  06/05/16               Reason for consult:  Facility Placement, Discharge Planning                Permission sought to share information with:  Facility Sport and exercise psychologist, Family Supports Permission granted to share information::  Yes, Verbal Permission Granted  Name::     Leighton Parody  Agency::  SNF's  Relationship::  Daughter  Contact Information:  [phone]  Housing/Transportation Living arrangements for the past 2 months:  Apartment Source of Information:  Patient, Medical Team Patient Interpreter Needed:  None Criminal Activity/Legal Involvement Pertinent to Current Situation/Hospitalization:  No - Comment as needed Significant Relationships:  Adult Children, Other Family Members Lives with:  Self Do you feel safe going back to the place where you live?  No Need for family participation in patient care:  Yes (Comment)  Care giving concerns:  PT recommending SNF once medically stable.   Social Worker assessment / plan:  CSW met with patient. No supports at bedside. CSW introduced role and explained that PT recommendations would be discussed. Per RN, patient is not oriented to situation but patient told CSW that she is agreeable to SNF placement and wants to go back [facility] where she was before. Patient stated that her daughter is a good contact. Patient began to cry when discussingCSW called and left message for patient's daughter with possibly a granddaughter? No further concerns. Will wait to fax out when patient's daughter gives permission since patient is not fully oriented. No further concerns. CSW encouraged patient to contact CSW as needed. CSW will continue to follow patient for support and facilitate discharge to SNF, if daughter agreeable or patient gains full orientation, once medically stable.  Update: Patient's  daughter arrived to patient's room and is agreeable to patient going back to [employer]. Referral faxed to them. CSW provided information on free ramps for patient's home.  Employment status:  Retired Nurse, adult PT Recommendations:  [place] / Referral to community resources:  [place]  Patient/Family's Response to care:  Patient agreeable to SNF placement but is not fully oriented. Awaiting call back from her daughter. Patient's family supportive and involved in patient's care. Patient appreciated social work intervention.  Patient/Family's Understanding of and Emotional Response to Diagnosis, Current Treatment, and Prognosis:  Patient understands and is agreeable to discharge plan. Awaiting call back from daughter since patient is not fully oriented. Patient appears happy with hospital care.  Emotional Assessment Appearance:  Appears stated age Attitude/Demeanor/Rapport:  Other (Pleasant) Affect (typically observed):  Accepting, Appropriate, Calm, Pleasant, Tearful/Crying Orientation:  Oriented to Self, Oriented to Place, Oriented to  Time Alcohol / Substance use:  Never Used Psych involvement (Current and /or in the community):  No (Comment)  Discharge Needs  Concerns to be addressed:  Care Coordination Readmission within the last 30 days:  Yes Current discharge risk:  Cognitively Impaired, Dependent with Mobility, Lives alone Barriers to Discharge:  Continued Medical Work up   Candie Chroman, LCSW 06/05/2016, 1:45 PM

## 2016-06-05 NOTE — Evaluation (Signed)
Physical Therapy Evaluation Patient Details Name: Lisa [name] MRN: [id number] DOB: January 20, 1941 Today's Date: 06/05/2016   History of Present Illness  Pt is a 75 y.o female who presents with chest pain, SOB, decreased PO intake, and diarrhea. PMH includes CAD, HTN, chronic diastolic CHF.  Clinical Impression  Patient demonstrates deficits in functional mobility as indicated below. Will need continued skilled PT to address deficits and maximize function. Will see as indicated and progress as tolerated.  Will need SNF upon acute discharge as patient currently requires +2 physical assist for mobility.    Follow Up Recommendations SNF    Equipment Recommendations  None recommended by PT    Recommendations for Other Services       Precautions / Restrictions Precautions Precautions: Fall Precaution Comments: watch O2 saturations Restrictions Weight Bearing Restrictions: No      Mobility  Bed Mobility Overal bed mobility: Needs Assistance Bed Mobility: Supine to Sit     Supine to sit: Max assist     General bed mobility comments: HOB elevated, use of chuck pad and LUE to pull to sit using rail and therapist  Transfers Overall transfer level: Needs assistance   Transfers: Sit to/from Stand;Stand Pivot Transfers Sit to Stand: Max assist;+2 physical assistance Stand pivot transfers: Max assist;+2 physical assistance (using lift equipment)       General transfer comment: Patient unable to stand with out +2 max assist, significant weakness noted in LEs, limited ability to pull upright with steady dur to painful RUE  Ambulation/Gait                Stairs            Wheelchair Mobility    Modified Rankin (Stroke Patients Only)       Balance Overall balance assessment: Needs assistance Sitting-balance support: Feet supported;Single extremity supported Sitting balance-Leahy Scale: Fair Sitting balance - Comments: able to sit EOB without assist      Standing balance-Leahy Scale: Zero Standing balance comment: unable to stand or power to upright at this time, required max assist of 2 for modified standing in steady                             Pertinent Vitals/Pain Pain Assessment: Faces Faces Pain Scale: Hurts even more Pain Location: right UE and generalized pain throughout) Pain Descriptors / Indicators: Aching;Discomfort;Grimacing;Guarding;Sore Pain Intervention(s): Monitored during session    Home Living Family/patient expects to be discharged to:: Private residence Living Arrangements: Alone Available Help at Discharge: Family;Available PRN/intermittently;Personal care attendant Type of Home: Apartment Home Access: Stairs to enter Entrance Stairs-Rails: Right;Left;Can reach both Entrance Stairs-Number of Steps: 5 Home Layout: One level Home Equipment: Walker - 4 wheels;Shower seat      Prior Function Level of Independence: Needs assistance   Gait / Transfers Assistance Needed: Uses rollator for ambulation in house and uses wheelchair out in the community  ADL's / Homemaking Assistance Needed: aide comes in 2 hours a day and assists with bathing and dressing        Hand Dominance   Dominant Hand: Right    Extremity/Trunk Assessment   Upper Extremity Assessment: RUE deficits/detail           Lower Extremity Assessment: Generalized weakness         Communication   Communication: No difficulties  Cognition Arousal/Alertness: Awake/alert Behavior During Therapy: Flat affect Overall Cognitive Status: No family/caregiver present to determine baseline cognitive functioning  General Comments      Exercises     Assessment/Plan    PT Assessment Patient needs continued PT services  PT Problem List Decreased strength;Decreased balance;Decreased activity tolerance;Decreased mobility;Decreased safety awareness;Cardiopulmonary status limiting activity;Pain           PT Treatment Interventions DME instruction;Stair training;Gait training;Functional mobility training;Therapeutic activities;Therapeutic exercise;Balance training;Patient/family education    PT Goals (Current goals can be found in the Care Plan section)  Acute Rehab PT Goals Patient Stated Goal: to get better PT Goal Formulation: With patient Time For Goal Achievement: 06/19/16 Potential to Achieve Goals: Fair    Frequency Min 2X/week   Barriers to discharge Decreased caregiver support;Inaccessible home environment      Co-evaluation               End of Session Equipment Utilized During Treatment: Gait belt Activity Tolerance: Patient limited by fatigue Patient left: in chair;with call bell/phone within reach;with chair alarm set Nurse Communication: Mobility status         Time: [id number] PT Time Calculation (min) (ACUTE ONLY): 26 min   Charges:   PT Evaluation $PT Eval Moderate Complexity: 1 Procedure PT Treatments $Therapeutic Activity: 8-22 mins   PT G Codes:        Fabio AsaDevon J Ariatna Jester 06/05/2016, 12:39 PM Charlotte Crumbevon Takesha Steger, PT DPT  [phone]

## 2016-06-05 NOTE — Progress Notes (Addendum)
PROGRESS NOTE  Lisa SchneidersGrace A Kiesling ZOX:[id number] DOB: [birth year] DOA: 06/02/2016 PCP: [name],[name], MD   Brief History:  75 year old female with a history of hypertension, hyperlipidemia, diastolic CHF, atrial fibrillation, coronary artery disease status post CABG, diabetes mellitus type 2, morbid obesity, chronic respiratory failure on 2 L presented from home secondary to somnolence, lethargy, and increasing shortness of breath. The patient was recently discharged from a skilled nursing facility 3 days prior to this admission and has been living at home with her family. Upon arrival, EMS noted that the patient's O2 saturations were 84% on room air. The patient is a poor historian, but family states that that she has had myalgias and arthralgias for the past 2-3 days prior to this admission. Upon presentation, the patient was noted to have temperature 100.95F with heart rate 105 requiring up to 4 L nasal cannula to maintain adequate oxygen saturation. There was concern for pulmonary infection; therefore, the patient was started on intravenous Zosyn and vancomycin. As the patient improved clinically and remained afebrile, antibiotics were discontinued on 06/04/2016. The patient was started on IV furosemide with good clinical effect.  Assessment/Plan: Acute on chronic respiratory failure with hypoxia -Secondary to decompensated CHF - At baseline, patient is on 2 L nasal cannula. Wean oxygen as tolerated back to baseline. Currently on 3 L/m. -patient received 3.7 L as part of the sepsis protocol -Lactic acid 1.38 -check PCT--0.40 -check flu PCR--neg  -viral respiratory panel--neg  Acute on chronic diastolic CHF -Unfortunately, the patient is a poor historian -During her office visit 05/03/2016, the patient's weight was 232 pounds -Admission weight 254 pounds -Started intravenous furosemide-->switch to po lasix 11/28 pm. Creatinine has increased slightly. If continues to increase  then may have to consider holding Lasix temporarily. -Not sure if her weight recording is accurate either. Weight down by 4 pounds since admission. -I/O not accurate >actually shows +5.8 L -Certainly, the patient's albumin may be contributing to a degree of third spacing  Fever -sepsis ruled out -flu--neg -check viral respiratory panel: Negative. -UA neg for pyria -CXR-no consolidation  Persistent Atrial fibrillation -Continue apixaban -Rate controlled -Continue metoprolol succinate -CHADSVASc = 6 (CHF, HTN, Age, CAD, Female)  Acute Encephalopathy -multifactorial including possible infection, decompensated CHF and acute on chronic renal failure -check B12--406 -TSH--7.344--> repeat and check TSH and free T4. -ammonia--22 -improving, close to baseline 11/28 per daughter  Acute on chronic renal failure--CKD stage III -Baseline creatinine 1.1-1.4 -Monitor with diuresis. Creatinine gradually creeping up, 1.77 today.  Hypertension -Improvement with diuresis -Continue metoprolol succinate -d/c amlodpine as BP dropping with diuresis  Foot rash -This appears to be ecchymosis on examination--nonblanching -Suspect the patient had recent trauma in the setting of apixiban use -No signs of infection  OSA  -- the patient is tolerating PAP therapy well without any problems at home.   Hyperlipidemia -continue statin  Hypomagnesemia - Replace and follow.  Anemia - No bleeding reported. Follow CBC.   Coagulase negative blood culture in one of 2 bottles - Likely contaminant. No intervention.   Disposition Plan:   as per PT evaluation, recommend SNF at discharge. Social work Administrator, sportsconsultation. Family Communication:   none at bedside.   Consultants:  none  Code Status:  FULL  DVT Prophylaxis: apixaban   Procedures: As Listed in Progress Note Above  Antibiotics: None    Subjective: Complaints of neck pain and leg pain. No dyspnea or chest pain reported.     Objective:  Vitals:   06/04/16 1113 06/04/16 1915 06/05/16 0612 06/05/16 1153  BP: (!) 103/48 (!) 110/53 100/60 (!) 134/51  Pulse: 74 75 80 74  Resp: 18 18 18 20   Temp: 98 F (36.7 C) 98.4 F (36.9 C) 98 F (36.7 C) 99.3 F (37.4 C)  TempSrc: Oral Oral Oral Oral  SpO2: 95% 99% 95% 97%  Weight:   113.4 kg (250 lb)     Intake/Output Summary (Last 24 hours) at 06/05/16 1613 Last data filed at 06/05/16 1447  Gross per 24 hour  Intake              830 ml  Output                0 ml  Net              830 ml   Weight change: 0.499 kg (1 lb 1.6 oz) Exam:   General:  Pt is alert, follows commands appropriately, not in acute distress. Sitting up comfortably in chair this morning. Morbidly obese.  HEENT: No icterus, No thrush, No neck mass, [place]/AT  Cardiovascular: RRR, S1/S2, no rubs, no gallops. Trace leg edema. Telemetry: PAF with BBB morphology.  Respiratory: Diminished breath sounds in the bases. Poor inspiratory effort. No wheezing. Good air movement. Rest of lung fields clear to auscultation. No increased work of breathing.   Abdomen: Soft/+BS, non tender, non distended, no guarding  Extremities: No edema, No lymphangitis, No petechiae, No rashes, no synovitis   Data Reviewed: I have personally reviewed following labs and imaging studies Basic Metabolic Panel:  Recent Labs Lab 06/02/16 1914 06/03/16 0511 06/04/16 0326 06/05/16 0538  NA 136 136 136 133*  K 4.1 4.0 3.8 3.5  CL 100* 101 100* 96*  CO2 24 24 26 26   GLUCOSE 113* 114* 90 84  BUN 20 20 23* 29*  CREATININE 1.63* 1.47* 1.60* 1.77*  CALCIUM 9.0 8.5* 8.7* 8.3*  MG  --   --  1.4* 1.4*   Liver Function Tests:  Recent Labs Lab 06/02/16 1914 06/03/16 0511  AST 23 28  ALT 13* 12*  ALKPHOS 102 87  BILITOT 1.3* 1.3*  PROT 6.8 6.5  ALBUMIN 2.6* 2.4*   No results for input(s): LIPASE, AMYLASE in the last 168 hours.  Recent Labs Lab 06/04/16 0326  AMMONIA 22   Coagulation Profile:  Recent  Labs Lab 06/02/16 1914  INR 2.68   CBC:  Recent Labs Lab 06/02/16 1914 06/03/16 0511  WBC 10.6* 11.0*  NEUTROABS 8.2*  --   HGB 9.1* 8.4*  HCT 27.8* 26.1*  MCV 96.2 96.7  PLT 174 182   Cardiac Enzymes: No results for input(s): CKTOTAL, CKMB, CKMBINDEX, TROPONINI in the last 168 hours. BNP: Invalid input(s): POCBNP CBG:  Recent Labs Lab 06/03/16 0312  GLUCAP 95   HbA1C: No results for input(s): HGBA1C in the last 72 hours. Urine analysis:    Component Value Date/Time   COLORURINE YELLOW 06/02/2016 2235   APPEARANCEUR CLEAR 06/02/2016 2235   LABSPEC 1.012 06/02/2016 2235   PHURINE 7.5 06/02/2016 2235   GLUCOSEU NEGATIVE 06/02/2016 2235   HGBUR SMALL (A) 06/02/2016 2235   BILIRUBINUR NEGATIVE 06/02/2016 2235   KETONESUR NEGATIVE 06/02/2016 2235   PROTEINUR 100 (A) 06/02/2016 2235   UROBILINOGEN 1.0 08/20/2014 1130   NITRITE NEGATIVE 06/02/2016 2235   LEUKOCYTESUR NEGATIVE 06/02/2016 2235   Sepsis Labs: @LABRCNTIP (procalcitonin:4,lacticidven:4) ) Recent Results (from the past 240 hour(s))  Blood culture (routine x 2)  Status: Abnormal   Collection Time: 06/02/16  2:13 PM  Result Value Ref Range Status   Specimen Description BLOOD RIGHT ANTECUBITAL  Final   Special Requests BOTTLES DRAWN AEROBIC AND ANAEROBIC 5CC  Final   Culture  Setup Time   Final    GRAM POSITIVE COCCI IN CLUSTERS IN BOTH AEROBIC AND ANAEROBIC BOTTLES CRITICAL RESULT CALLED TO, READ BACK BY AND VERIFIED WITH: A. Reece Leader.D. 11:15 06/03/16  ([email or web address])    Culture (A)  Final    STAPHYLOCOCCUS SPECIES (COAGULASE NEGATIVE) THE SIGNIFICANCE OF ISOLATING THIS ORGANISM FROM A SINGLE SET OF BLOOD CULTURES WHEN MULTIPLE SETS ARE DRAWN IS UNCERTAIN. PLEASE NOTIFY THE MICROBIOLOGY DEPARTMENT WITHIN ONE WEEK IF SPECIATION AND SENSITIVITIES ARE REQUIRED.    Report Status 06/05/2016 FINAL  Final  Blood Culture ID Panel (Reflexed)     Status: Abnormal   Collection Time: 06/02/16  2:13 PM    Result Value Ref Range Status   Enterococcus species NOT DETECTED NOT DETECTED Final   Listeria monocytogenes NOT DETECTED NOT DETECTED Final   Staphylococcus species DETECTED (A) NOT DETECTED Final    Comment: CRITICAL RESULT CALLED TO, READ BACK BY AND VERIFIED WITH: A. Reece Leader.D. 11:15 06/03/16 ([email or web address])    Staphylococcus aureus NOT DETECTED NOT DETECTED Final   Methicillin resistance DETECTED (A) NOT DETECTED Final    Comment: CRITICAL RESULT CALLED TO, READ BACK BY AND VERIFIED WITH: A. Reece Leader.D. 11:15 06/03/16 ([email or web address])    Streptococcus species NOT DETECTED NOT DETECTED Final   Streptococcus agalactiae NOT DETECTED NOT DETECTED Final   Streptococcus pneumoniae NOT DETECTED NOT DETECTED Final   Streptococcus pyogenes NOT DETECTED NOT DETECTED Final   Acinetobacter baumannii NOT DETECTED NOT DETECTED Final   Enterobacteriaceae species NOT DETECTED NOT DETECTED Final   Enterobacter cloacae complex NOT DETECTED NOT DETECTED Final   Escherichia coli NOT DETECTED NOT DETECTED Final   Klebsiella oxytoca NOT DETECTED NOT DETECTED Final   Klebsiella pneumoniae NOT DETECTED NOT DETECTED Final   Proteus species NOT DETECTED NOT DETECTED Final   Serratia marcescens NOT DETECTED NOT DETECTED Final   Haemophilus influenzae NOT DETECTED NOT DETECTED Final   Neisseria meningitidis NOT DETECTED NOT DETECTED Final   Pseudomonas aeruginosa NOT DETECTED NOT DETECTED Final   Candida albicans NOT DETECTED NOT DETECTED Final   Candida glabrata NOT DETECTED NOT DETECTED Final   Candida krusei NOT DETECTED NOT DETECTED Final   Candida parapsilosis NOT DETECTED NOT DETECTED Final   Candida tropicalis NOT DETECTED NOT DETECTED Final  Blood culture (routine x 2)     Status: None (Preliminary result)   Collection Time: 06/02/16  7:10 PM  Result Value Ref Range Status   Specimen Description BLOOD LEFT ANTECUBITAL  Final   Special Requests BOTTLES DRAWN AEROBIC AND ANAEROBIC 5CC  Final    Culture NO GROWTH 3 DAYS  Final   Report Status PENDING  Incomplete  Urine culture     Status: Abnormal   Collection Time: 06/02/16 10:35 PM  Result Value Ref Range Status   Specimen Description URINE, CATHETERIZED  Final   Special Requests NONE  Final   Culture MULTIPLE SPECIES PRESENT, SUGGEST RECOLLECTION (A)  Final   Report Status 06/05/2016 FINAL  Final  MRSA PCR Screening     Status: None   Collection Time: 06/03/16  9:28 AM  Result Value Ref Range Status   MRSA by PCR NEGATIVE NEGATIVE Final    Comment:        The GeneXpert MRSA Assay (FDA  approved for NASAL specimens only), is one component of a comprehensive MRSA colonization surveillance program. It is not intended to diagnose MRSA infection nor to guide or monitor treatment for MRSA infections.   Respiratory Panel by PCR     Status: None   Collection Time: 06/03/16  9:43 AM  Result Value Ref Range Status   Adenovirus NOT DETECTED NOT DETECTED Final   Coronavirus 229E NOT DETECTED NOT DETECTED Final   Coronavirus HKU1 NOT DETECTED NOT DETECTED Final   Coronavirus NL63 NOT DETECTED NOT DETECTED Final   Coronavirus OC43 NOT DETECTED NOT DETECTED Final   Metapneumovirus NOT DETECTED NOT DETECTED Final   Rhinovirus / Enterovirus NOT DETECTED NOT DETECTED Final   Influenza A NOT DETECTED NOT DETECTED Final   Influenza B NOT DETECTED NOT DETECTED Final   Parainfluenza Virus 1 NOT DETECTED NOT DETECTED Final   Parainfluenza Virus 2 NOT DETECTED NOT DETECTED Final   Parainfluenza Virus 3 NOT DETECTED NOT DETECTED Final   Parainfluenza Virus 4 NOT DETECTED NOT DETECTED Final   Respiratory Syncytial Virus NOT DETECTED NOT DETECTED Final   Bordetella pertussis NOT DETECTED NOT DETECTED Final   Chlamydophila pneumoniae NOT DETECTED NOT DETECTED Final   Mycoplasma pneumoniae NOT DETECTED NOT DETECTED Final     Scheduled Meds: . apixaban  5 mg Oral BID  . atorvastatin  10 mg Oral q1800  . buPROPion  150 mg Oral Daily  .  furosemide  40 mg Oral Daily  . gabapentin  600 mg Oral BID  . metoprolol succinate  25 mg Oral Daily  . pantoprazole  40 mg Oral Daily  . sodium chloride flush  3 mL Intravenous Q12H  . traMADol  50 mg Oral Q12H   Continuous Infusions:  Procedures/Studies: Dg Chest 2 View  Result Date: 06/02/2016 CLINICAL DATA:  Trouble breathing today, weakness EXAM: CHEST  2 VIEW COMPARISON:  05/05/2016 FINDINGS: Median sternotomy wires are again evident. Discoid atelectasis in the right mid lung zone. Linear atelectasis in the left upper lobe. Low lung volumes. Suspect tiny left pleural effusion. Mild to moderate cardiomegaly with mild central congestion. No pneumothorax. Partially visualized spinal hardware. IMPRESSION: 1. Low lung volumes. 2. Linear atelectasis in the right lower lung zone. Linear atelectasis in the left upper lobe. 3. Stable cardiomegaly. Mild central vascular congestion. Suspect tiny left effusion. Electronically Signed   By: Jasmine PangKim  Fujinaga M.D.   On: 06/02/2016 23:42   [place] Renal  Result Date: 05/07/2016 CLINICAL DATA:  75 year old hypertensive diabetic female with renal failure. Subsequent encounter. EXAM: RENAL / URINARY TRACT ULTRASOUND COMPLETE COMPARISON:  05/05/2016 CT. FINDINGS: Right Kidney: Length: 10.3 cm. No hydronephrosis. Upper pole 2.6 cm cyst and 5.8 cm cyst mid aspect. Left Kidney: Length: 11 cm.  Extra renal pelvis without hydronephrosis. Bladder: Appears normal for degree of bladder distention. IMPRESSION: No hydronephrosis.  Extrarenal pelvis on the left. Two right renal cysts measuring up to 5.8 cm. Electronically Signed   By: Lacy DuverneySteven  Olson M.D.   On: 05/07/2016 13:35    Maylani Embree, MD, FACP, FHM. [facility] Pager [phone]  If 7PM-7AM, please contact night-coverage [email or web address] Password [email or web address] 06/05/2016, 4:22 PM   LOS: 2 days

## 2016-06-05 NOTE — Progress Notes (Signed)
Patient slept well through the 7 p to 7 a shift with only complaint being that of her chronic hip pain for which she received scheduled Tramadol and one dose of prn Tylenol.  O2 sats in 90's on 2 liters.

## 2016-06-05 NOTE — NC FL2 (Signed)
[place] MEDICAID FL2 LEVEL OF CARE SCREENING TOOL     IDENTIFICATION  Patient Name: Lisa [name] Birthdate: [birth year] Sex: female Admission Date (Current Location): 06/02/2016  [facility] [place]   Facility and Address:  [facility] [place], [address], [place], [place] [address]      Provider Number: [id number]  Attending Physician Name and Address:  Elease EtienneAnand D Hongalgi, MD  Relative Name and Phone Number:       Current Level of Care: Hospital Recommended Level of Care: Skilled Nursing Facility Prior Approval Number:    Date Approved/Denied:   PASRR Number: [id number]  Discharge Plan: SNF    Current Diagnoses: Patient Active Problem List   Diagnosis Date Noted  . Sepsis (HCC) 06/03/2016  . Rash 06/03/2016  . Acute on chronic diastolic CHF (congestive heart failure) (HCC) 06/03/2016  . Acute on chronic respiratory failure with hypoxia (HCC) 06/03/2016  . Influenza-like illness   . Rash and nonspecific skin eruption   . Chest pain 05/08/2016  . Acute kidney injury superimposed on chronic kidney disease (HCC)   . Renal failure   . PAF- DDCV Jan 2017 05/06/2016  . Abdominal pain   . Chest pain, atypical 05/03/2016  . Nausea, vomiting and diarrhea 05/03/2016  . Bradycardia 01/29/2016  . Persistent atrial fibrillation (HCC) 04/24/2015  . Ankle fracture 08/18/2014  . Closed right ankle fracture 08/18/2014  . Fall   . PVC (premature ventricular contraction) 03/22/2014  . SOB (shortness of breath) 09/22/2013  . Hx of CABG 05/06/2013  . Chronic diastolic CHF (congestive heart failure) (HCC) 05/06/2013  . OSA (obstructive sleep apnea) 05/06/2013  . Morbid obesity (HCC)   . Diastolic dysfunction   . Hyperlipidemia   . GERD (gastroesophageal reflux disease) 10/27/2010  . Gout 10/27/2010  . Hypertension 10/27/2010  . Asthma 10/27/2010  . Diabetes mellitus (HCC) 10/27/2010    Orientation RESPIRATION BLADDER Height &  Weight     Self, Time, Place  O2 (Nasal canula 3 L) Incontinent Weight: 250 lb (113.4 kg) Height:     BEHAVIORAL SYMPTOMS/MOOD NEUROLOGICAL BOWEL NUTRITION STATUS   (None)  (None) Continent Diet (Heart healthy)  AMBULATORY STATUS COMMUNICATION OF NEEDS Skin   Extensive Assist Verbally Other (Comment) (Pressure injury (no stage listed): Buttocks. Foam daily)                       Personal Care Assistance Level of Assistance  Bathing, Feeding, Dressing Bathing Assistance: Limited assistance Feeding assistance: Limited assistance Dressing Assistance: Limited assistance     Functional Limitations Info  Sight, Hearing, Speech Sight Info: Adequate Hearing Info: Adequate Speech Info: Adequate    SPECIAL CARE FACTORS FREQUENCY  PT (By licensed PT), Blood pressure, Diabetic urine testing, OT (By licensed OT)     PT Frequency: 5 x week OT Frequency: 5 x week            Contractures Contractures Info: Not present    Additional Factors Info  Code Status, Allergies Code Status Info: Full Allergies Info: NKDA           Current Medications (06/05/2016):  This is the current hospital active medication list Current Facility-Administered Medications  Medication Dose Route Frequency Provider Last Rate Last Dose  . acetaminophen (TYLENOL) tablet 650 mg  650 mg Oral Q6H PRN Clydie Braunondell A Smith, MD   650 mg at 06/05/16 0031   Or  . acetaminophen (TYLENOL) suppository 650 mg  650 mg  Rectal Q6H PRN Clydie Braunondell A Smith, MD      . apixaban (ELIQUIS) tablet 5 mg  5 mg Oral BID Clydie Braunondell A Smith, MD   5 mg at 06/05/16 1036  . atorvastatin (LIPITOR) tablet 10 mg  10 mg Oral q1800 Clydie Braunondell A Smith, MD   10 mg at 06/04/16 1644  . buPROPion (WELLBUTRIN XL) 24 hr tablet 150 mg  150 mg Oral Daily Clydie Braunondell A Smith, MD   150 mg at 06/05/16 1037  . fluticasone (FLOVENT HFA) 220 MCG/ACT inhaler 2 puff  2 puff Inhalation BID PRN Clydie Braunondell A Smith, MD      . furosemide (LASIX) tablet 40 mg  40 mg Oral Daily  Catarina Hartshornavid Tat, MD   40 mg at 06/05/16 1036  . gabapentin (NEURONTIN) capsule 600 mg  600 mg Oral BID Clydie Braunondell A Smith, MD   600 mg at 06/05/16 1036  . ipratropium-albuterol (DUONEB) 0.5-2.5 (3) MG/3ML nebulizer solution 3 mL  3 mL Nebulization Q6H PRN Clydie Braunondell A Smith, MD      . metoprolol succinate (TOPROL-XL) 24 hr tablet 25 mg  25 mg Oral Daily Clydie Braunondell A Smith, MD   25 mg at 06/05/16 1036  . ondansetron (ZOFRAN) tablet 4 mg  4 mg Oral Q6H PRN Clydie Braunondell A Smith, MD       Or  . ondansetron (ZOFRAN) injection 4 mg  4 mg Intravenous Q6H PRN Rondell A Katrinka BlazingSmith, MD      . pantoprazole (PROTONIX) EC tablet 40 mg  40 mg Oral Daily Clydie Braunondell A Smith, MD   40 mg at 06/05/16 1036  . sodium chloride flush (NS) 0.9 % injection 3 mL  3 mL Intravenous Q12H Clydie Braunondell A Smith, MD   3 mL at 06/04/16 2117  . sodium chloride flush (NS) 0.9 % injection 3 mL  3 mL Intravenous PRN Clydie Braunondell A Smith, MD      . traMADol Janean Sark(ULTRAM) tablet 50 mg  50 mg Oral Q12H Rondell Burtis JunesA Smith, MD   50 mg at 06/05/16 1036     Discharge Medications: Please see discharge summary for a list of discharge medications.  Relevant Imaging Results:  Relevant Lab Results:   Additional Information SS#: [id number]. Was discharged from SNF earlier this month.  Margarito LinerSarah C Winton [place]ord, LCSW

## 2016-06-06 DIAGNOSIS — I481 Persistent atrial fibrillation: Secondary | ICD-10-CM

## 2016-06-06 DIAGNOSIS — J9621 Acute and chronic respiratory failure with hypoxia: Secondary | ICD-10-CM

## 2016-06-06 DIAGNOSIS — I5033 Acute on chronic diastolic (congestive) heart failure: Secondary | ICD-10-CM

## 2016-06-06 DIAGNOSIS — Z7901 Long term (current) use of anticoagulants: Secondary | ICD-10-CM

## 2016-06-06 LAB — MAGNESIUM: Magnesium: 2 mg/dL (ref 1.7–2.4)

## 2016-06-06 LAB — BASIC METABOLIC PANEL
ANION GAP: 9 (ref 5–15)
BUN: 32 mg/dL — ABNORMAL HIGH (ref 6–20)
CO2: 27 mmol/L (ref 22–32)
Calcium: 8.5 mg/dL — ABNORMAL LOW (ref 8.9–10.3)
Chloride: 98 mmol/L — ABNORMAL LOW (ref 101–111)
Creatinine, Ser: 1.83 mg/dL — ABNORMAL HIGH (ref 0.44–1.00)
GFR calc Af Amer: 30 mL/min — ABNORMAL LOW (ref >60)
GFR, EST NON AFRICAN AMERICAN: 25 mL/min — AB (ref >60)
GLUCOSE: 92 mg/dL (ref 65–99)
POTASSIUM: 3.3 mmol/L — AB (ref 3.5–5.1)
Sodium: 134 mmol/L — ABNORMAL LOW (ref 135–145)

## 2016-06-06 LAB — CBC
HEMATOCRIT: 24.4 % — AB (ref 36.0–46.0)
HEMOGLOBIN: 7.9 g/dL — AB (ref 12.0–15.0)
MCH: 30.7 pg (ref 26.0–34.0)
MCHC: 32.4 g/dL (ref 30.0–36.0)
MCV: 94.9 fL (ref 78.0–100.0)
Platelets: 177 10*3/uL (ref 150–400)
RBC: 2.57 MIL/uL — ABNORMAL LOW (ref 3.87–5.11)
RDW: 13.3 % (ref 11.5–15.5)
WBC: 10.6 10*3/uL — ABNORMAL HIGH (ref 4.0–10.5)

## 2016-06-06 MED ORDER — NITROGLYCERIN IN D5W 200-5 MCG/ML-% IV SOLN
10.0000 ug/min | INTRAVENOUS | Status: DC
  Administered 2016-06-06: 10 ug/min via INTRAVENOUS
  Administered 2016-06-08: 20 ug/min via INTRAVENOUS
  Administered 2016-06-11: 10 ug/min via INTRAVENOUS
  Filled 2016-06-06 (×3): qty 250

## 2016-06-06 MED ORDER — POTASSIUM CHLORIDE CRYS ER 20 MEQ PO TBCR
40.0000 meq | EXTENDED_RELEASE_TABLET | Freq: Once | ORAL | Status: AC
  Administered 2016-06-06: 40 meq via ORAL
  Filled 2016-06-06: qty 2

## 2016-06-06 NOTE — Consult Note (Addendum)
CARDIOLOGY CONSULT NOTE      Patient ID: Lisa [name] MRN: [id number] DOB/AGE: [birth year] 75 y.o.  Admit date: 06/02/2016 Referring PhysicianAnand Irena Cords, MD Primary Abram Sander, MD Primary Cardiologist Dr. Mayford Knife Reason for Consultation : heart failure  HPI: 75 year old woman with a history of diastolic heart failure. She was admitted several days ago and diuresed for symptoms of fluid overload. She has been on 2 L nasal cannula since Thanksgiving. This was confirmed by the daughter.    She was treated for a respiratory infection with IV Zosyn.  She reports that her breathing is much better. Her creatinine has increased. We are asked to further evaluate.  Review of systems complete and found to be negative unless listed above   Past Medical History:  Diagnosis Date  . Chronic diastolic CHF (congestive heart failure) (HCC)   . Coronary artery disease    s/p CABG  . Diabetes mellitus without complication (HCC)   . Hyperlipidemia   . Hypertension   . Morbid obesity (HCC)   . OSA (obstructive sleep apnea)    mild OSA with AHI 7.15 now on CPAP at 16cm H2O  . RBBB     Family History  Problem Relation Age of Onset  . Heart attack Mother   . Heart disease Mother   . Stomach cancer Father     Social History   Social History  . Marital status: Widowed    Spouse name: N/A  . Number of children: N/A  . Years of education: N/A   Occupational History  . Not on file.   Social History Main Topics  . Smoking status: Former Games developer  . Smokeless tobacco: Never Used  . Alcohol use No  . Drug use: No  . Sexual activity: No   Other Topics Concern  . Not on file   Social History Narrative  . No narrative on file    Past Surgical History:  Procedure Laterality Date  . BACK SURGERY    . CARDIAC CATHETERIZATION    . CARDIAC SURGERY    . CARDIOVERSION N/A 08/03/2015   Procedure: CARDIOVERSION;  Surgeon: Vesta Mixer, MD;  Location: [facility] ENDOSCOPY;   Service: Cardiovascular;  Laterality: N/A;  . CORONARY ANGIOPLASTY WITH STENT PLACEMENT    . CORONARY ARTERY BYPASS GRAFT    . KNEE SURGERY Bilateral   . ORIF ANKLE FRACTURE Right 08/19/2014   Procedure: OPEN REDUCTION INTERNAL FIXATION (ORIF) ANKLE FRACTURE;  Surgeon: Cammy Copa, MD;  Location: [facility] OR;  Service: Orthopedics;  Laterality: Right;  . rotator cuff surgery       Prescriptions Prior to Admission  Medication Sig Dispense Refill Last Dose  . amLODipine (NORVASC) 5 MG tablet Take 1 tablet (5 mg total) by mouth daily. 30 tablet 11 06/02/2016 at Unknown time  . apixaban (ELIQUIS) 5 MG TABS tablet Take 1 tablet (5 mg total) by mouth 2 (two) times daily. 180 tablet 3 06/02/2016 at 830  . atorvastatin (LIPITOR) 10 MG tablet Take 1 tablet (10 mg total) by mouth daily at 6 PM. 30 tablet 0 06/01/2016 at Unknown time  . buPROPion (WELLBUTRIN XL) 150 MG 24 hr tablet Take 150 mg by mouth daily. Reported on 08/02/2015   06/02/2016 at Unknown time  . etodolac (LODINE) 400 MG tablet Take 400 mg by mouth 2 (two) times daily.   06/02/2016 at am  . fluticasone (FLOVENT HFA) 220 MCG/ACT inhaler Inhale 2 puffs into the lungs 2 (two) times daily as needed (shortness of  breath/ wheezing).   unknown  . furosemide (LASIX) 20 MG tablet Take 40 mg by mouth daily.   06/02/2016 at Unknown time  . gabapentin (NEURONTIN) 600 MG tablet Take 1 tablet (600 mg total) by mouth 2 (two) times daily.   06/02/2016 at am  . ipratropium-albuterol (DUONEB) 0.5-2.5 (3) MG/3ML SOLN Take 3 mLs by nebulization every 6 (six) hours as needed. Wheezing or shortness of breath. (Patient taking differently: Take 3 mLs by nebulization every 6 (six) hours as needed (shortness of breath/ wheezing). )   not yet taken  . metoprolol succinate (TOPROL XL) 25 MG 24 hr tablet Take 1 tablet (25 mg total) by mouth daily. 30 tablet 11 06/02/2016 at 830  . omeprazole (PRILOSEC) 20 MG capsule Take 20 mg by mouth daily.   06/02/2016 at Unknown  time  . OXYGEN Inhale 2 L into the lungs continuous.   06/02/2016 at Unknown time  . traMADol (ULTRAM) 50 MG tablet Take 50 mg by mouth every 12 (twelve) hours.   06/02/2016 at 830  . esomeprazole (NEXIUM) 40 MG capsule Take 1 capsule (40 mg total) by mouth daily before breakfast. (Patient not taking: Reported on 06/02/2016) 30 capsule 11 Not Taking at Unknown time    Physical Exam: Vitals:   Vitals:   06/06/16 0703 06/06/16 0815 06/06/16 1201 06/06/16 1228  BP: 128/72 (!) 154/76 (!) 136/98   Pulse: 79 65 88   Resp: 18 18 18    Temp: 98.4 F (36.9 C) 98.3 F (36.8 C) 98.8 F (37.1 C)   TempSrc: Oral Oral Oral   SpO2: 99% 98% 99%   Weight: 253 lb 1.6 oz (114.8 kg)     Height:    5\' 2"  (1.575 m)   I&O's:   Intake/Output Summary (Last 24 hours) at 06/06/16 1836 Last data filed at 06/06/16 1349  Gross per 24 hour  Intake              775 ml  Output              301 ml  Net              474 ml   Physical exam:  /AT, obese EOMI No JVD, No carotid bruit RRR S1S2  Bilateral wheezing Soft. NT, nondistended Bilateral lower extremity edema. No focal motor or sensory deficits Normal affect Shiny skin on legs c/w edema  Labs:   Lab Results  Component Value Date   WBC 10.6 (H) 06/06/2016   HGB 7.9 (L) 06/06/2016   HCT 24.4 (L) 06/06/2016   MCV 94.9 06/06/2016   PLT 177 06/06/2016    Recent Labs Lab 06/03/16 0511  06/06/16 0442  NA 136  < > 134*  K 4.0  < > 3.3*  CL 101  < > 98*  CO2 24  < > 27  BUN 20  < > 32*  CREATININE 1.47*  < > 1.83*  CALCIUM 8.5*  < > 8.5*  PROT 6.5  --   --   BILITOT 1.3*  --   --   ALKPHOS 87  --   --   ALT 12*  --   --   AST 28  --   --   GLUCOSE 114*  < > 92  < > = values in this interval not displayed. Lab Results  Component Value Date   CKTOTAL 24 10/01/2009   CKMB 0.8 10/01/2009   TROPONINI 0.04 (HH) 05/04/2016    Lab Results  Component  Value Date   CHOL 133 05/04/2016   CHOL 145 03/29/2015   CHOL 156 09/20/2014   Lab  Results  Component Value Date   HDL 36 (L) 05/04/2016   HDL 52.80 03/29/2015   HDL 86.70 09/20/2014   Lab Results  Component Value Date   LDLCALC 76 05/04/2016   LDLCALC 65 03/29/2015   LDLCALC 57 09/20/2014   Lab Results  Component Value Date   TRIG 106 05/04/2016   TRIG 135.0 03/29/2015   TRIG 63.0 09/20/2014   Lab Results  Component Value Date   CHOLHDL 3.7 05/04/2016   CHOLHDL 3 03/29/2015   CHOLHDL 2 09/20/2014   No results found for: LDLDIRECT    Radiology: Vascular congestion GNF:AOZHYQEKG:Atrial fibrillation   ASSESSMENT AND PLAN:  Active Problems:   GERD (gastroesophageal reflux disease)   Hypertension   Morbid obesity (HCC)   Chronic diastolic CHF (congestive heart failure) (HCC)   Persistent atrial fibrillation (HCC)   Sepsis (HCC)   Rash   Acute on chronic diastolic CHF (congestive heart failure) (HCC)   Acute on chronic respiratory failure with hypoxia (HCC)   Influenza-like illness   Rash and nonspecific skin eruption   Pressure injury of skin   Signed:  1) symptoms have improved with diuresis. Now with worsening renal function. She still appears to be volume overloaded given the wheezing and lower extremity edema.   To help with vascular congestion, will start IV nitroglycerin. This can be transitioned to oral isosorbide. This will hopefully help respiratory status without changing her renal function.   We'll follow.   Continue rate control for atrial fibrillation. Eliquis for stroke prevention.   Fredric MareJay S. Neale Marzette, MD, [facility] ServFACC 06/06/2016, 6:36 PM

## 2016-06-06 NOTE — Consult Note (Addendum)
WOC Nurse wound consult note Reason for Consult: Consult requested for buttocks Wound type: Left buttock with stage 2 pressure injury Pressure Ulcer POA: Yes Measurement: .8X.8X.1cm Wound bed: pink and dry Drainage (amount, consistency, odor) No odor or drainage Periwound: Intact skin surrounding Dressing procedure/placement/frequency: Foam dressing to protect and promote healing.  Discussed plan of care with patient at the bedside. Please re-consult if further assistance is needed.  Thank-you,  Cammie Mcgeeawn Maggy Wyble MSN, RN, CWOCN, [place]CN-AP, CNS [phone]

## 2016-06-06 NOTE — Progress Notes (Addendum)
PROGRESS NOTE  Lisa [name] ZOX:[id number] DOB: 1941-05-24 DOA: 06/02/2016 PCP: Laurena Slimmer, MD   Brief History:  75 year old female with a history of hypertension, hyperlipidemia, diastolic CHF, atrial fibrillation, coronary artery disease status post CABG, diabetes mellitus type 2, morbid obesity, chronic respiratory failure on 2 L presented from home secondary to somnolence, lethargy, and increasing shortness of breath. The patient was recently discharged from a skilled nursing facility 3 days prior to this admission and has been living at home with her family. Upon arrival, EMS noted that the patient's O2 saturations were 84% on room air. The patient is a poor historian, but family states that that she has had myalgias and arthralgias for the past 2-3 days prior to this admission. Upon presentation, the patient was noted to have temperature 100.19F with heart rate 105 requiring up to 4 L nasal cannula to maintain adequate oxygen saturation. There was concern for pulmonary infection; therefore, the patient was started on intravenous Zosyn and vancomycin. As the patient improved clinically and remained afebrile, antibiotics were discontinued on 06/04/2016. The patient was started on IV furosemide with good clinical effect.  Assessment/Plan: Acute on chronic respiratory failure with hypoxia -Secondary to decompensated CHF - At baseline, patient is on 2 L nasal cannula. Wean oxygen as tolerated back to baseline. Currently on 3 L/m. -patient received 3.7 L as part of the sepsis protocol -Lactic acid 1.38 -check PCT--0.40 -check flu PCR--neg  -viral respiratory panel--neg  Acute on chronic diastolic CHF -Unfortunately, the patient is a poor historian -During her office visit 05/03/2016, the patient's weight was 232 pounds -Admission weight 254 pounds -Started intravenous furosemide-->switch to po lasix 81/19 pm.  -Certainly, the patient's albumin may be contributing to a  degree of third spacing - Creatinine has gradually increased from 1.47 on 11/27 to 1.83 on 11/13. She remains volume overloaded. We may have to accept higher creatinine related to diuresis. Oral Lasix held 11/13 pending cardiology consultation may consider IV Lasix.  Fever -sepsis ruled out -flu--neg - viral respiratory panel: Negative. -UA neg for pyria -CXR-no consolidation  Persistent Atrial fibrillation -Continue apixaban -Rate controlled -Continue metoprolol succinate -CHADSVASc = 6 (CHF, HTN, Age, CAD, Female)  Acute Encephalopathy -multifactorial including possible infection, decompensated CHF and acute on chronic renal failure -check B12--406 -TSH--7.344--> repeated TSH: 4.52 and free T4: 1.44. Clinically euthyroid. Recommend repeating full TFTs in 4-6 weeks. -ammonia--22 -improving, close to baseline 11/28 per daughter  Acute on chronic renal failure--CKD stage III -Baseline creatinine 1.1-1.4 -Monitor with diuresis. Creatinine gradually increased to 1.8. Oral Lasix held pending cardiology evaluation.  Hypertension -Improvement with diuresis -Continue metoprolol succinate -d/c amlodpine as BP dropping with diuresis  Foot rash -This appears to be ecchymosis on examination--nonblanching -Suspect the patient had recent trauma in the setting of apixiban use -No signs of infection  OSA  -- the patient is tolerating CPAP therapy well without any problems at home.   Hyperlipidemia -continue statin  Hypomagnesemia - Replaced.  Anemia - No bleeding reported. Hemoglobin gradually dropping 9.5 > 7.9. No bleeding reported. Follow CBC in a.m. Transfuse if hemoglobin <7 g per DL.  Coagulase negative blood culture in one of 2 bottles - Likely contaminant. No intervention.  Hypokalemia - Replace and follow.  Right upper extremity swelling - Patient states that she has had this for approximately 2 weeks. Denies pain. May be asymmetrical edema but need to rule  out DVT. Check venous Doppler. Elevated extremity. Good peripheral  pulsations.   Disposition Plan:   as per PT evaluation, recommend SNF at discharge. Social work Administrator, sportsconsultation. Not medically stable for discharge. Family Communication:   none at bedside.   Consultants:   cardiology  Code Status:  FULL  DVT Prophylaxis: apixaban   Procedures: As Listed in Progress Note Above  Antibiotics: None    Subjective: Poor historian. Has her oxygen on her forehead and states that it's not been connected. Dyspnea on exertion and? Orthopnea. No chest pain. Complains of generalized aches and pains. Painless right upper extremity swelling-? 2 weeks' duration.   Objective: Vitals:   06/06/16 0703 06/06/16 0815 06/06/16 1201 06/06/16 1228  BP: 128/72 (!) 154/76 (!) 136/98   Pulse: 79 65 88   Resp: 18 18 18    Temp: 98.4 F (36.9 C) 98.3 F (36.8 C) 98.8 F (37.1 C)   TempSrc: Oral Oral Oral   SpO2: 99% 98% 99%   Weight: 114.8 kg (253 lb 1.6 oz)     Height:    5\' 2"  (1.575 m)    Intake/Output Summary (Last 24 hours) at 06/06/16 1335 Last data filed at 06/06/16 1029  Gross per 24 hour  Intake             1065 ml  Output              301 ml  Net              764 ml   Weight change:  Exam:   General:  Pleasant elderly female, morbidly obese, lying comfortably propped up in bed starting to eat breakfast.  HEENT: No icterus, No thrush, No neck mass, [place]/AT  Cardiovascular: RRR, S1/S2, no rubs, no gallops. Trace leg edema. Telemetry: SR with BBB morphology.  Respiratory: Diminished breath sounds in the bases with few bibasal crackles. Poor inspiratory effort. No wheezing. Good air movement. Rest of lung fields clear to auscultation. No increased work of breathing.   Abdomen: Soft/+BS, non tender, non distended, no guarding  Extremities: Diffuse swelling of right upper extremity, more so distally in the hand and forearm with any other acute findings. Good pulses felt.  Normal color.   Data Reviewed: I have personally reviewed following labs and imaging studies Basic Metabolic Panel:  Recent Labs Lab 06/02/16 1914 06/03/16 0511 06/04/16 0326 06/05/16 0538 06/06/16 0442  NA 136 136 136 133* 134*  K 4.1 4.0 3.8 3.5 3.3*  CL 100* 101 100* 96* 98*  CO2 24 24 26 26 27   GLUCOSE 113* 114* 90 84 92  BUN 20 20 23* 29* 32*  CREATININE 1.63* 1.47* 1.60* 1.77* 1.83*  CALCIUM 9.0 8.5* 8.7* 8.3* 8.5*  MG  --   --  1.4* 1.4* 2.0   Liver Function Tests:  Recent Labs Lab 06/02/16 1914 06/03/16 0511  AST 23 28  ALT 13* 12*  ALKPHOS 102 87  BILITOT 1.3* 1.3*  PROT 6.8 6.5  ALBUMIN 2.6* 2.4*   No results for input(s): LIPASE, AMYLASE in the last 168 hours.  Recent Labs Lab 06/04/16 0326  AMMONIA 22   Coagulation Profile:  Recent Labs Lab 06/02/16 1914  INR 2.68   CBC:  Recent Labs Lab 06/02/16 1914 06/03/16 0511 06/06/16 0442  WBC 10.6* 11.0* 10.6*  NEUTROABS 8.2*  --   --   HGB 9.1* 8.4* 7.9*  HCT 27.8* 26.1* 24.4*  MCV 96.2 96.7 94.9  PLT 174 182 177   Cardiac Enzymes: No results for input(s): CKTOTAL, CKMB, CKMBINDEX, TROPONINI in  the last 168 hours. BNP: Invalid input(s): POCBNP CBG:  Recent Labs Lab 06/03/16 0312  GLUCAP 95   HbA1C: No results for input(s): HGBA1C in the last 72 hours. Urine analysis:    Component Value Date/Time   COLORURINE YELLOW 06/02/2016 2235   APPEARANCEUR CLEAR 06/02/2016 2235   LABSPEC 1.012 06/02/2016 2235   PHURINE 7.5 06/02/2016 2235   GLUCOSEU NEGATIVE 06/02/2016 2235   HGBUR SMALL (A) 06/02/2016 2235   BILIRUBINUR NEGATIVE 06/02/2016 2235   KETONESUR NEGATIVE 06/02/2016 2235   PROTEINUR 100 (A) 06/02/2016 2235   UROBILINOGEN 1.0 08/20/2014 1130   NITRITE NEGATIVE 06/02/2016 2235   LEUKOCYTESUR NEGATIVE 06/02/2016 2235   Sepsis Labs: @LABRCNTIP (procalcitonin:4,lacticidven:4) ) Recent Results (from the past 240 hour(s))  Blood culture (routine x 2)     Status: Abnormal    Collection Time: 06/02/16  2:13 PM  Result Value Ref Range Status   Specimen Description BLOOD RIGHT ANTECUBITAL  Final   Special Requests BOTTLES DRAWN AEROBIC AND ANAEROBIC 5CC  Final   Culture  Setup Time   Final    GRAM POSITIVE COCCI IN CLUSTERS IN BOTH AEROBIC AND ANAEROBIC BOTTLES CRITICAL RESULT CALLED TO, READ BACK BY AND VERIFIED WITH: A. Reece LeaderAnderson Pharm.D. 11:15 06/03/16  ([email or web address])    Culture (A)  Final    STAPHYLOCOCCUS SPECIES (COAGULASE NEGATIVE) THE SIGNIFICANCE OF ISOLATING THIS ORGANISM FROM A SINGLE SET OF BLOOD CULTURES WHEN MULTIPLE SETS ARE DRAWN IS UNCERTAIN. PLEASE NOTIFY THE MICROBIOLOGY DEPARTMENT WITHIN ONE WEEK IF SPECIATION AND SENSITIVITIES ARE REQUIRED.    Report Status 06/05/2016 FINAL  Final  Blood Culture ID Panel (Reflexed)     Status: Abnormal   Collection Time: 06/02/16  2:13 PM  Result Value Ref Range Status   Enterococcus species NOT DETECTED NOT DETECTED Final   Listeria monocytogenes NOT DETECTED NOT DETECTED Final   Staphylococcus species DETECTED (A) NOT DETECTED Final    Comment: CRITICAL RESULT CALLED TO, READ BACK BY AND VERIFIED WITH: A. Reece LeaderAnderson Pharm.D. 11:15 06/03/16 ([email or web address])    Staphylococcus aureus NOT DETECTED NOT DETECTED Final   Methicillin resistance DETECTED (A) NOT DETECTED Final    Comment: CRITICAL RESULT CALLED TO, READ BACK BY AND VERIFIED WITH: A. Reece LeaderAnderson Pharm.D. 11:15 06/03/16 ([email or web address])    Streptococcus species NOT DETECTED NOT DETECTED Final   Streptococcus agalactiae NOT DETECTED NOT DETECTED Final   Streptococcus pneumoniae NOT DETECTED NOT DETECTED Final   Streptococcus pyogenes NOT DETECTED NOT DETECTED Final   Acinetobacter baumannii NOT DETECTED NOT DETECTED Final   Enterobacteriaceae species NOT DETECTED NOT DETECTED Final   Enterobacter cloacae complex NOT DETECTED NOT DETECTED Final   Escherichia coli NOT DETECTED NOT DETECTED Final   Klebsiella oxytoca NOT DETECTED NOT DETECTED Final   Klebsiella  pneumoniae NOT DETECTED NOT DETECTED Final   Proteus species NOT DETECTED NOT DETECTED Final   Serratia marcescens NOT DETECTED NOT DETECTED Final   Haemophilus influenzae NOT DETECTED NOT DETECTED Final   Neisseria meningitidis NOT DETECTED NOT DETECTED Final   Pseudomonas aeruginosa NOT DETECTED NOT DETECTED Final   Candida albicans NOT DETECTED NOT DETECTED Final   Candida glabrata NOT DETECTED NOT DETECTED Final   Candida krusei NOT DETECTED NOT DETECTED Final   Candida parapsilosis NOT DETECTED NOT DETECTED Final   Candida tropicalis NOT DETECTED NOT DETECTED Final  Blood culture (routine x 2)     Status: None (Preliminary result)   Collection Time: 06/02/16  7:10 PM  Result Value Ref Range Status   Specimen Description BLOOD LEFT  ANTECUBITAL  Final   Special Requests BOTTLES DRAWN AEROBIC AND ANAEROBIC 5CC  Final   Culture NO GROWTH 3 DAYS  Final   Report Status PENDING  Incomplete  Urine culture     Status: Abnormal   Collection Time: 06/02/16 10:35 PM  Result Value Ref Range Status   Specimen Description URINE, CATHETERIZED  Final   Special Requests NONE  Final   Culture MULTIPLE SPECIES PRESENT, SUGGEST RECOLLECTION (A)  Final   Report Status 06/05/2016 FINAL  Final  MRSA PCR Screening     Status: None   Collection Time: 06/03/16  9:28 AM  Result Value Ref Range Status   MRSA by PCR NEGATIVE NEGATIVE Final    Comment:        The GeneXpert MRSA Assay (FDA approved for NASAL specimens only), is one component of a comprehensive MRSA colonization surveillance program. It is not intended to diagnose MRSA infection nor to guide or monitor treatment for MRSA infections.   Respiratory Panel by PCR     Status: None   Collection Time: 06/03/16  9:43 AM  Result Value Ref Range Status   Adenovirus NOT DETECTED NOT DETECTED Final   Coronavirus 229E NOT DETECTED NOT DETECTED Final   Coronavirus HKU1 NOT DETECTED NOT DETECTED Final   Coronavirus NL63 NOT DETECTED NOT DETECTED  Final   Coronavirus OC43 NOT DETECTED NOT DETECTED Final   Metapneumovirus NOT DETECTED NOT DETECTED Final   Rhinovirus / Enterovirus NOT DETECTED NOT DETECTED Final   Influenza A NOT DETECTED NOT DETECTED Final   Influenza B NOT DETECTED NOT DETECTED Final   Parainfluenza Virus 1 NOT DETECTED NOT DETECTED Final   Parainfluenza Virus 2 NOT DETECTED NOT DETECTED Final   Parainfluenza Virus 3 NOT DETECTED NOT DETECTED Final   Parainfluenza Virus 4 NOT DETECTED NOT DETECTED Final   Respiratory Syncytial Virus NOT DETECTED NOT DETECTED Final   Bordetella pertussis NOT DETECTED NOT DETECTED Final   Chlamydophila pneumoniae NOT DETECTED NOT DETECTED Final   Mycoplasma pneumoniae NOT DETECTED NOT DETECTED Final     Scheduled Meds: . apixaban  5 mg Oral BID  . atorvastatin  10 mg Oral q1800  . buPROPion  150 mg Oral Daily  . furosemide  40 mg Oral Daily  . gabapentin  600 mg Oral BID  . metoprolol succinate  25 mg Oral Daily  . pantoprazole  40 mg Oral Daily  . sodium chloride flush  3 mL Intravenous Q12H  . traMADol  50 mg Oral Q12H   Continuous Infusions:  Procedures/Studies: Dg Chest 2 View  Result Date: 06/02/2016 CLINICAL DATA:  Trouble breathing today, weakness EXAM: CHEST  2 VIEW COMPARISON:  05/05/2016 FINDINGS: Median sternotomy wires are again evident. Discoid atelectasis in the right mid lung zone. Linear atelectasis in the left upper lobe. Low lung volumes. Suspect tiny left pleural effusion. Mild to moderate cardiomegaly with mild central congestion. No pneumothorax. Partially visualized spinal hardware. IMPRESSION: 1. Low lung volumes. 2. Linear atelectasis in the right lower lung zone. Linear atelectasis in the left upper lobe. 3. Stable cardiomegaly. Mild central vascular congestion. Suspect tiny left effusion. Electronically Signed   By: Jasmine Pang M.D.   On: 06/02/2016 23:42    Kortne All, MD, FACP, FHM. [facility] Pager [phone]  If 7PM-7AM, please  contact night-coverage [email or web address] Password [email or web address] 06/06/2016, 1:35 PM   LOS: 3 days

## 2016-06-06 NOTE — Progress Notes (Signed)
Occupational Therapy Evaluation Patient Details Name: Lisa [name] MRN: [id number] DOB: 09-Nov-1940 Today's Date: 06/06/2016    History of Present Illness Pt is a 75 y.o female who presents with chest pain, SOB, decreased PO intake, and diarrhea. PMH includes CAD, HTN, chronic diastolic CHF.   Clinical Impression   PTA, pt lived at home and had PCA 2 hrs/day 7 days/wk. Daughter states pt recently DC for from @ Thanksgiving. Pt will benefit from rehab at SNF. Will follow acutely to address goals. Recommend nsg use maximove to get pt OOB to chair. Daughter asking about insurance paying for SNF.     Follow Up Recommendations  SNF;Supervision/Assistance - 24 hour    Equipment Recommendations  None recommended by OT    Recommendations for Other Services       Precautions / Restrictions Precautions Precautions: Fall Precaution Comments: watch O2 saturations Restrictions Weight Bearing Restrictions: No      Mobility Bed Mobility Overal bed mobility: Needs Assistance Bed Mobility: Supine to Sit;Sit to Supine     Supine to sit: Max assist Sit to supine: Max assist;+2 for physical assistance   General bed mobility comments: HOB elevated, use of chuck pad and LUE to pull to sit using rail and therapist  Transfers         Stand pivot transfers:  (using lift equipment)       General transfer comment: unable to assess    Balance     Sitting balance-Leahy Scale: Fair                                      ADL Overall ADL's : Needs assistance/impaired Eating/Feeding: Set up   Grooming: Moderate assistance   Upper Body Bathing: Moderate assistance   Lower Body Bathing: Total assistance   Upper Body Dressing : Maximal assistance   Lower Body Dressing: Total assistance               Functional mobility during ADLs: Total assistance;+2 for physical assistance       Vision     Perception     Praxis      Pertinent Vitals/Pain  Faces Pain Scale: Hurts even more Pain Location: back/RUE Pain Descriptors / Indicators: Aching;Discomfort Pain Intervention(s): Limited activity within patient's tolerance     Hand Dominance Right   Extremity/Trunk Assessment Upper Extremity Assessment Upper Extremity Assessment: RUE deficits/detail RUE Deficits / Details: limited functional use RUE/ shoulder flex @ 0-30 RUE Coordination: decreased fine motor;decreased gross motor   Lower Extremity Assessment Lower Extremity Assessment: Defer to PT evaluation (limited B knee flexion)   Cervical / Trunk Assessment Cervical / Trunk Assessment: Normal   Communication Communication Communication: No difficulties   Cognition Arousal/Alertness: Awake/alert Behavior During Therapy: Flat affect Overall Cognitive Status: History of cognitive impairments - at baseline                     General Comments       Exercises Exercises: Other exercises Other Exercises Other Exercises: general UB A/AAROM   Shoulder Instructions      Home Living Family/patient expects to be discharged to:: Skilled nursing facility Living Arrangements: Alone Available Help at Discharge: Family;Available PRN/intermittently;Personal care attendant (PCA 2 hrs/day 7 days/wk) Type of Home: Apartment Home Access: Stairs to enter Entrance Stairs-Number of Steps: 5 Entrance Stairs-Rails: Right;Left;Can reach both Home Layout: One level     Bathroom  Shower/Tub: Chief Strategy OfficerTub/shower unit   Bathroom Toilet: Standard Bathroom Accessibility: Yes   Home Equipment: Environmental consultantWalker - 4 wheels;Shower seat   Additional Comments: daughter working on trying to have a ramp built. In the process of getting a power wc. used w/c at home      Prior Functioning/Environment Level of Independence: Needs assistance  Gait / Transfers Assistance Needed: Uses rollator for ambulation in house and uses wheelchair out in the community (daughter states she uses wc in house) ADL's /  Homemaking Assistance Needed: aide comes in 2 hours a day and assists with bathing and dressing            OT Problem List: Decreased strength;Decreased range of motion;Decreased activity tolerance;Impaired balance (sitting and/or standing);Decreased coordination;Decreased safety awareness;Decreased knowledge of use of DME or AE;Cardiopulmonary status limiting activity;Obesity;Impaired UE functional use;Pain   OT Treatment/Interventions: Self-care/ADL training;Therapeutic exercise;Energy conservation;DME and/or AE instruction;Therapeutic activities;Cognitive remediation/compensation;Patient/family education;Balance training    OT Goals(Current goals can be found in the care plan section) Acute Rehab OT Goals Patient Stated Goal: to get better OT Goal Formulation: With patient Time For Goal Achievement: 06/20/16 Potential to Achieve Goals: Good ADL Goals Pt Will Perform Upper Body Bathing: with set-up;with supervision;bed level Pt Will Perform Lower Body Bathing: with mod assist;bed level Pt Will Transfer to Toilet: with mod assist;with +2 assist;bedside commode (using stedy) Pt/caregiver will Perform Home Exercise Program: Increased strength;Both right and left upper extremity;With written HEP provided;With Supervision  OT Frequency: Min 2X/week   Barriers to D/C:            Co-evaluation              End of Session Equipment Utilized During Treatment: Oxygen (2L) Nurse Communication: Mobility status;Need for lift equipment  Activity Tolerance: Patient tolerated treatment well Patient left: in bed;with call bell/phone within reach;with bed alarm set;with family/visitor present   Time: [id number] OT Time Calculation (min): 32 min Charges:  OT General Charges $OT Visit: 1 Procedure OT Evaluation $OT Eval Moderate Complexity: 1 Procedure OT Treatments $Self Care/Home Management : 8-22 mins G-Codes:    Olvin Rohr,HILLARY 06/06/2016, 4:30 PM   [facility] Nikitia Asbill, OTR/L   [phone] 06/06/2016

## 2016-06-06 NOTE — Progress Notes (Addendum)
Daily weights order has been cancelled, as of 11.30.17 @ 1347. ______________________________________________  New daily weights has been ordered, as of 11.30.17 @1402 .  Please continue to obtain daily weights on this pt.

## 2016-06-07 ENCOUNTER — Inpatient Hospital Stay (HOSPITAL_COMMUNITY): Payer: Medicare Other

## 2016-06-07 DIAGNOSIS — N289 Disorder of kidney and ureter, unspecified: Secondary | ICD-10-CM

## 2016-06-07 DIAGNOSIS — I482 Chronic atrial fibrillation, unspecified: Secondary | ICD-10-CM | POA: Diagnosis present

## 2016-06-07 DIAGNOSIS — M79609 Pain in unspecified limb: Secondary | ICD-10-CM

## 2016-06-07 DIAGNOSIS — N189 Chronic kidney disease, unspecified: Secondary | ICD-10-CM

## 2016-06-07 DIAGNOSIS — Z7901 Long term (current) use of anticoagulants: Secondary | ICD-10-CM

## 2016-06-07 LAB — CBC
HEMATOCRIT: 24 % — AB (ref 36.0–46.0)
Hemoglobin: 7.9 g/dL — ABNORMAL LOW (ref 12.0–15.0)
MCH: 31.1 pg (ref 26.0–34.0)
MCHC: 32.9 g/dL (ref 30.0–36.0)
MCV: 94.5 fL (ref 78.0–100.0)
Platelets: 186 10*3/uL (ref 150–400)
RBC: 2.54 MIL/uL — AB (ref 3.87–5.11)
RDW: 13.6 % (ref 11.5–15.5)
WBC: 10.6 10*3/uL — AB (ref 4.0–10.5)

## 2016-06-07 LAB — BASIC METABOLIC PANEL
ANION GAP: 12 (ref 5–15)
BUN: 34 mg/dL — ABNORMAL HIGH (ref 6–20)
CHLORIDE: 94 mmol/L — AB (ref 101–111)
CO2: 25 mmol/L (ref 22–32)
Calcium: 8.6 mg/dL — ABNORMAL LOW (ref 8.9–10.3)
Creatinine, Ser: 1.87 mg/dL — ABNORMAL HIGH (ref 0.44–1.00)
GFR calc non Af Amer: 25 mL/min — ABNORMAL LOW (ref >60)
GFR, EST AFRICAN AMERICAN: 29 mL/min — AB (ref >60)
Glucose, Bld: 119 mg/dL — ABNORMAL HIGH (ref 65–99)
POTASSIUM: 3.6 mmol/L (ref 3.5–5.1)
SODIUM: 131 mmol/L — AB (ref 135–145)

## 2016-06-07 LAB — CULTURE, BLOOD (ROUTINE X 2): CULTURE: NO GROWTH

## 2016-06-07 LAB — PROCALCITONIN: PROCALCITONIN: 0.33 ng/mL

## 2016-06-07 MED ORDER — FUROSEMIDE 10 MG/ML IJ SOLN
80.0000 mg | Freq: Once | INTRAMUSCULAR | Status: AC
  Administered 2016-06-07: 80 mg via INTRAMUSCULAR
  Filled 2016-06-07: qty 8

## 2016-06-07 NOTE — Progress Notes (Signed)
PROGRESS NOTE  Lisa SchneidersGrace A [name] UJW:[id number] DOB: 1941/05/10 DOA: 06/02/2016 PCP: [name],[name], MD   Brief History:  [age] year old female with a history of hypertension, hyperlipidemia, diastolic CHF, atrial fibrillation, coronary artery disease status post CABG, diabetes mellitus type 2, morbid obesity, chronic respiratory failure on 2 L presented from home secondary to somnolence, lethargy, and increasing shortness of breath. The patient was recently discharged from a skilled nursing facility 3 days prior to this admission and has been living at home with her family. Upon arrival, EMS noted that the patient's O2 saturations were 84% on room air. The patient is a poor historian, but family states that that she has had myalgias and arthralgias for the past 2-3 days prior to this admission. Upon presentation, the patient was noted to have temperature 100.10F with heart rate 105 requiring up to 4 L nasal cannula to maintain adequate oxygen saturation. There was concern for pulmonary infection; therefore, the patient was started on intravenous Zosyn and vancomycin. As the patient improved clinically and remained afebrile, antibiotics were discontinued on 06/04/2016. The patient was started on IV furosemide with good clinical effect.  Assessment/Plan: Acute on chronic respiratory failure with hypoxia -Secondary to decompensated CHF - At baseline, patient is on 2 L nasal cannula. Wean oxygen as tolerated back to baseline. Currently on 3 L/m. -patient received 3.7 L as part of the sepsis protocol -Lactic acid 1.38 -check PCT--0.40 -check flu PCR--neg  -viral respiratory panel--neg  Acute on chronic diastolic CHF -Unfortunately, the patient is a poor historian -During her office visit 05/03/2016, the patient's weight was 232 pounds -Admission weight 254 pounds -Certainly, the patient's albumin may be contributing to a degree of third spacing - Started intravenous furosemide-->switch  to po lasix 11/28 pm. Creatinine gradually increased from 1.47 on 11/27 to 1.83 on 11/30. She remained volume overloaded. We may have to accept higher creatinine related to diuresis. Cardiology was consulted >started NTG infusion to help with blood pressure and treat pulmonary edema and recommend considering PRBC transfusion if hemoglobin drops further.  Fever -sepsis ruled out -flu--neg - viral respiratory panel: Negative. -UA neg for pyria -CXR-no consolidation  Persistent Atrial fibrillation -Continue apixaban -Rate controlled -Continue metoprolol succinate -CHADSVASc = 6 (CHF, HTN, Age, CAD, Female)  Acute Encephalopathy -multifactorial including possible infection, decompensated CHF and acute on chronic renal failure -check B12--406 -TSH--7.344--> repeated TSH: 4.52 and free T4: 1.44. Clinically euthyroid. Recommend repeating full TFTs in 4-6 weeks. -ammonia--22 -improving, close to baseline 11/28 per daughter  Acute on chronic renal failure--CKD stage III -Baseline creatinine 1.1-1.4 -Monitor with diuresis. Creatinine gradually increased to 1.8. Creatinine has remained stable in the 1.8 range over the last 2 days. Follow closely.  Hypertension -Improvement with diuresis -Continue metoprolol succinate -d/c amlodpine as BP dropping with diuresis. IV NTG infusion started by cardiology on 11/30.  Foot rash -This appears to be ecchymosis on examination--nonblanching -Suspect the patient had recent trauma in the setting of apixiban use -No signs of infection  OSA  -- the patient is tolerating CPAP therapy well without any problems at home.   Hyperlipidemia -continue statin  Hypomagnesemia - Replaced.  Anemia - No bleeding reported. Hemoglobin gradually dropping 9.5 > 7.9 >7.9. No bleeding reported. Follow CBC in a.m. Transfuse if hemoglobin <7 g per DL.  Coagulase negative blood culture in one of 2 bottles - Likely contaminant. No  intervention.  Hypokalemia - Replaced.  Right upper extremity swelling - Patient states that  she has had this for approximately 2 weeks. Denies pain. May be asymmetrical edema . Venous Doppler negative for DVT. Elevated extremity. Good peripheral pulsations. Improving.   Disposition Plan:   as per PT evaluation, recommend SNF at discharge. Social work Administrator, sportsconsultation. Not medically stable for discharge. Family Communication:   none at bedside.   Consultants:   cardiology  Code Status:  FULL  DVT Prophylaxis: apixaban   Procedures: As Listed in Progress Note Above  Antibiotics: None    Subjective: Seen this morning. Complained of leg pains. On and off dyspnea. Not much appetite.  Objective: Vitals:   06/06/16 1201 06/06/16 1228 06/07/16 0900 06/07/16 1210  BP: (!) 136/98   109/73  Pulse: 88   77  Resp: 18   18  Temp: 98.8 F (37.1 C)   98.5 F (36.9 C)  TempSrc: Oral   Oral  SpO2: 99%   100%  Weight:   113.9 kg (251 lb 1.6 oz)   Height:  5\' 2"  (1.575 m)      Intake/Output Summary (Last 24 hours) at 06/07/16 1606 Last data filed at 06/07/16 1029  Gross per 24 hour  Intake              175 ml  Output                0 ml  Net              175 ml   Weight change:  Exam:   General:  Pleasant elderly female, morbidly obese, sitting up in chair with breakfast tray in front of her. Not eating much.   HEENT: No icterus, No thrush, No neck mass, [place]/AT  Cardiovascular: RRR, S1/S2, no rubs, no gallops. Trace leg edema. Telemetry: PAF/SR with BBB morphology.  Respiratory: Diminished breath sounds in the bases with few bibasal crackles. Poor inspiratory effort. No wheezing. Good air movement. Rest of lung fields clear to auscultation. No increased work of breathing.   Abdomen: Soft/+BS, non tender, non distended, no guarding  Extremities: Diffuse swelling of right upper extremity, more so distally in the hand and forearm with any other acute findings. Good  pulses felt. Normal color. Right upper extremity swelling has improved.   Data Reviewed: I have personally reviewed following labs and imaging studies Basic Metabolic Panel:  Recent Labs Lab 06/03/16 0511 06/04/16 0326 06/05/16 0538 06/06/16 0442 06/07/16 0412  NA 136 136 133* 134* 131*  K 4.0 3.8 3.5 3.3* 3.6  CL 101 100* 96* 98* 94*  CO2 24 26 26 27 25   GLUCOSE 114* 90 84 92 119*  BUN 20 23* 29* 32* 34*  CREATININE 1.47* 1.60* 1.77* 1.83* 1.87*  CALCIUM 8.5* 8.7* 8.3* 8.5* 8.6*  MG  --  1.4* 1.4* 2.0  --    Liver Function Tests:  Recent Labs Lab 06/02/16 1914 06/03/16 0511  AST 23 28  ALT 13* 12*  ALKPHOS 102 87  BILITOT 1.3* 1.3*  PROT 6.8 6.5  ALBUMIN 2.6* 2.4*   No results for input(s): LIPASE, AMYLASE in the last 168 hours.  Recent Labs Lab 06/04/16 0326  AMMONIA 22   Coagulation Profile:  Recent Labs Lab 06/02/16 1914  INR 2.68   CBC:  Recent Labs Lab 06/02/16 1914 06/03/16 0511 06/06/16 0442 06/07/16 0412  WBC 10.6* 11.0* 10.6* 10.6*  NEUTROABS 8.2*  --   --   --   HGB 9.1* 8.4* 7.9* 7.9*  HCT 27.8* 26.1* 24.4* 24.0*  MCV 96.2 96.7  94.9 94.5  PLT 174 182 177 186   Cardiac Enzymes: No results for input(s): CKTOTAL, CKMB, CKMBINDEX, TROPONINI in the last 168 hours. BNP: Invalid input(s): POCBNP CBG:  Recent Labs Lab 06/03/16 0312  GLUCAP 95   HbA1C: No results for input(s): HGBA1C in the last 72 hours. Urine analysis:    Component Value Date/Time   COLORURINE YELLOW 06/02/2016 2235   APPEARANCEUR CLEAR 06/02/2016 2235   LABSPEC 1.012 06/02/2016 2235   PHURINE 7.5 06/02/2016 2235   GLUCOSEU NEGATIVE 06/02/2016 2235   HGBUR SMALL (A) 06/02/2016 2235   BILIRUBINUR NEGATIVE 06/02/2016 2235   KETONESUR NEGATIVE 06/02/2016 2235   PROTEINUR 100 (A) 06/02/2016 2235   UROBILINOGEN 1.0 08/20/2014 1130   NITRITE NEGATIVE 06/02/2016 2235   LEUKOCYTESUR NEGATIVE 06/02/2016 2235   Sepsis  Labs: @LABRCNTIP (procalcitonin:4,lacticidven:4) ) Recent Results (from the past 240 hour(s))  Blood culture (routine x 2)     Status: Abnormal   Collection Time: 06/02/16  2:13 PM  Result Value Ref Range Status   Specimen Description BLOOD RIGHT ANTECUBITAL  Final   Special Requests BOTTLES DRAWN AEROBIC AND ANAEROBIC 5CC  Final   Culture  Setup Time   Final    GRAM POSITIVE COCCI IN CLUSTERS IN BOTH AEROBIC AND ANAEROBIC BOTTLES CRITICAL RESULT CALLED TO, READ BACK BY AND VERIFIED WITH: A. Reece Leader.D. 11:15 06/03/16  ([email or web address])    Culture (A)  Final    STAPHYLOCOCCUS SPECIES (COAGULASE NEGATIVE) THE SIGNIFICANCE OF ISOLATING THIS ORGANISM FROM A SINGLE SET OF BLOOD CULTURES WHEN MULTIPLE SETS ARE DRAWN IS UNCERTAIN. PLEASE NOTIFY THE MICROBIOLOGY DEPARTMENT WITHIN ONE WEEK IF SPECIATION AND SENSITIVITIES ARE REQUIRED.    Report Status 06/05/2016 FINAL  Final  Blood Culture ID Panel (Reflexed)     Status: Abnormal   Collection Time: 06/02/16  2:13 PM  Result Value Ref Range Status   Enterococcus species NOT DETECTED NOT DETECTED Final   Listeria monocytogenes NOT DETECTED NOT DETECTED Final   Staphylococcus species DETECTED (A) NOT DETECTED Final    Comment: CRITICAL RESULT CALLED TO, READ BACK BY AND VERIFIED WITH: A. Reece Leader.D. 11:15 06/03/16 ([email or web address])    Staphylococcus aureus NOT DETECTED NOT DETECTED Final   Methicillin resistance DETECTED (A) NOT DETECTED Final    Comment: CRITICAL RESULT CALLED TO, READ BACK BY AND VERIFIED WITH: A. Reece Leader.D. 11:15 06/03/16 ([email or web address])    Streptococcus species NOT DETECTED NOT DETECTED Final   Streptococcus agalactiae NOT DETECTED NOT DETECTED Final   Streptococcus pneumoniae NOT DETECTED NOT DETECTED Final   Streptococcus pyogenes NOT DETECTED NOT DETECTED Final   Acinetobacter baumannii NOT DETECTED NOT DETECTED Final   Enterobacteriaceae species NOT DETECTED NOT DETECTED Final   Enterobacter cloacae complex NOT  DETECTED NOT DETECTED Final   Escherichia coli NOT DETECTED NOT DETECTED Final   Klebsiella oxytoca NOT DETECTED NOT DETECTED Final   Klebsiella pneumoniae NOT DETECTED NOT DETECTED Final   Proteus species NOT DETECTED NOT DETECTED Final   Serratia marcescens NOT DETECTED NOT DETECTED Final   Haemophilus influenzae NOT DETECTED NOT DETECTED Final   Neisseria meningitidis NOT DETECTED NOT DETECTED Final   Pseudomonas aeruginosa NOT DETECTED NOT DETECTED Final   Candida albicans NOT DETECTED NOT DETECTED Final   Candida glabrata NOT DETECTED NOT DETECTED Final   Candida krusei NOT DETECTED NOT DETECTED Final   Candida parapsilosis NOT DETECTED NOT DETECTED Final   Candida tropicalis NOT DETECTED NOT DETECTED Final  Blood culture (routine x 2)     Status: None  Collection Time: 06/02/16  7:10 PM  Result Value Ref Range Status   Specimen Description BLOOD LEFT ANTECUBITAL  Final   Special Requests BOTTLES DRAWN AEROBIC AND ANAEROBIC 5CC  Final   Culture NO GROWTH 5 DAYS  Final   Report Status 06/07/2016 FINAL  Final  Urine culture     Status: Abnormal   Collection Time: 06/02/16 10:35 PM  Result Value Ref Range Status   Specimen Description URINE, CATHETERIZED  Final   Special Requests NONE  Final   Culture MULTIPLE SPECIES PRESENT, SUGGEST RECOLLECTION (A)  Final   Report Status 06/05/2016 FINAL  Final  MRSA PCR Screening     Status: None   Collection Time: 06/03/16  9:28 AM  Result Value Ref Range Status   MRSA by PCR NEGATIVE NEGATIVE Final    Comment:        The GeneXpert MRSA Assay (FDA approved for NASAL specimens only), is one component of a comprehensive MRSA colonization surveillance program. It is not intended to diagnose MRSA infection nor to guide or monitor treatment for MRSA infections.   Respiratory Panel by PCR     Status: None   Collection Time: 06/03/16  9:43 AM  Result Value Ref Range Status   Adenovirus NOT DETECTED NOT DETECTED Final   Coronavirus 229E  NOT DETECTED NOT DETECTED Final   Coronavirus HKU1 NOT DETECTED NOT DETECTED Final   Coronavirus NL63 NOT DETECTED NOT DETECTED Final   Coronavirus OC43 NOT DETECTED NOT DETECTED Final   Metapneumovirus NOT DETECTED NOT DETECTED Final   Rhinovirus / Enterovirus NOT DETECTED NOT DETECTED Final   Influenza A NOT DETECTED NOT DETECTED Final   Influenza B NOT DETECTED NOT DETECTED Final   Parainfluenza Virus 1 NOT DETECTED NOT DETECTED Final   Parainfluenza Virus 2 NOT DETECTED NOT DETECTED Final   Parainfluenza Virus 3 NOT DETECTED NOT DETECTED Final   Parainfluenza Virus 4 NOT DETECTED NOT DETECTED Final   Respiratory Syncytial Virus NOT DETECTED NOT DETECTED Final   Bordetella pertussis NOT DETECTED NOT DETECTED Final   Chlamydophila pneumoniae NOT DETECTED NOT DETECTED Final   Mycoplasma pneumoniae NOT DETECTED NOT DETECTED Final     Scheduled Meds: . apixaban  5 mg Oral BID  . atorvastatin  10 mg Oral q1800  . buPROPion  150 mg Oral Daily  . furosemide  40 mg Oral Daily  . gabapentin  600 mg Oral BID  . metoprolol succinate  25 mg Oral Daily  . pantoprazole  40 mg Oral Daily  . sodium chloride flush  3 mL Intravenous Q12H  . traMADol  50 mg Oral Q12H   Continuous Infusions: . nitroGLYCERIN 20 mcg/min (06/07/16 1023)    Procedures/Studies: Dg Chest 2 View  Result Date: 06/02/2016 CLINICAL DATA:  Trouble breathing today, weakness EXAM: CHEST  2 VIEW COMPARISON:  05/05/2016 FINDINGS: Median sternotomy wires are again evident. Discoid atelectasis in the right mid lung zone. Linear atelectasis in the left upper lobe. Low lung volumes. Suspect tiny left pleural effusion. Mild to moderate cardiomegaly with mild central congestion. No pneumothorax. Partially visualized spinal hardware. IMPRESSION: 1. Low lung volumes. 2. Linear atelectasis in the right lower lung zone. Linear atelectasis in the left upper lobe. 3. Stable cardiomegaly. Mild central vascular congestion. Suspect tiny left  effusion. Electronically Signed   By: Jasmine Pang M.D.   On: 06/02/2016 23:42   Dg Chest Port 1 View  Result Date: 06/07/2016 CLINICAL DATA:  CHF short of breath EXAM: PORTABLE CHEST 1  VIEW COMPARISON:  06/04/2016 FINDINGS: Hypoventilation. Decreased lung volume with mild bibasilar atelectasis Cardiac enlargement without heart failure. CABG changes. Negative for pneumonia or effusion IMPRESSION: Hypoventilation with bibasilar atelectasis. Cardiac enlargement without heart failure. Electronically Signed   By: Marlan Palau M.D.   On: 06/07/2016 08:21    HONGALGI,ANAND, MD, FACP, FHM. [facility] Pager [phone]  If 7PM-7AM, please contact night-coverage [email or web address] Password [email or web address] 06/07/2016, 4:06 PM   LOS: 4 days

## 2016-06-07 NOTE — Progress Notes (Deleted)
Spoke with Vernona RiegerLaura, Palliative, who states that Palliative meeting is scheduled for today at 1600 with Algis DownsMarianne York, Palliative, pt, and pt's son planning to be in attendance.

## 2016-06-07 NOTE — Clinical Social Work Note (Addendum)
CSW met with patient. Daughter at bedside. CSW provided bed offer at [facility]. Patient's daughter wants to know if patient is in her copay days yet since she was recently at the SNF. CSW waiting on response from hospital liason.  [place] Scrape, [place]  12:48 pm Hospital liason for [facility] stated that she may be in her copay days but her Medicaid would cover the copay. Will notify patient and her daughter.  [place] Scrape, [place]

## 2016-06-07 NOTE — Clinical Social Work Note (Signed)
CSW confirmed with SNF that [facility], per MD request.  Charlynn CourtSarah Shalondra Wunschel, CSW [phone]

## 2016-06-07 NOTE — Progress Notes (Signed)
Patient Name: Lisa [name] Date of Encounter: 06/07/2016  Primary Cardiologist: Dr Lenise Arenaurner  Hospital Problem List     Principal Problem:   Acute on chronic respiratory failure with hypoxia [facility](HCC) Active Problems:   GERD (gastroesophageal reflux disease)   Hypertension   Morbid obesity (HCC)   Chronic diastolic CHF (congestive heart failure) (HCC)   Persistent atrial fibrillation (HCC)   Sepsis (HCC)   Acute on chronic diastolic CHF (congestive heart failure) (HCC)   Influenza-like illness   Rash and nonspecific skin eruption   Pressure injury of skin   Chronic atrial fibrillation (HCC)   Chronic anticoagulation   Anemia   Acute on chronic renal insufficiency     Subjective   Pt up in chair- "my feet hurt".   Inpatient Medications    Scheduled Meds: . apixaban  5 mg Oral BID  . atorvastatin  10 mg Oral q1800  . buPROPion  150 mg Oral Daily  . furosemide  40 mg Oral Daily  . gabapentin  600 mg Oral BID  . metoprolol succinate  25 mg Oral Daily  . pantoprazole  40 mg Oral Daily  . sodium chloride flush  3 mL Intravenous Q12H  . traMADol  50 mg Oral Q12H   Continuous Infusions: . nitroGLYCERIN 10 mcg/min (06/06/16 2104)   PRN Meds: acetaminophen **OR** acetaminophen, fluticasone, ipratropium-albuterol, ondansetron **OR** ondansetron (ZOFRAN) IV, sodium chloride flush   Vital Signs    Vitals:   06/06/16 0703 06/06/16 0815 06/06/16 1201 06/06/16 1228  BP: 128/72 (!) 154/76 (!) 136/98   Pulse: 79 65 88   Resp: 18 18 18    Temp: 98.4 F (36.9 C) 98.3 F (36.8 C) 98.8 F (37.1 C)   TempSrc: Oral Oral Oral   SpO2: 99% 98% 99%   Weight: 253 lb 1.6 oz (114.8 kg)     Height:    5\' 2"  (1.575 m)    Intake/Output Summary (Last 24 hours) at 06/07/16 0916 Last data filed at 06/06/16 1349  Gross per 24 hour  Intake              415 ml  Output                0 ml  Net              415 ml   Filed Weights   06/04/16 0616 06/05/16 0612 06/06/16 0703  Weight:  248 lb 14.4 oz (112.9 kg) 250 lb (113.4 kg) 253 lb 1.6 oz (114.8 kg)    Physical Exam    GEN: Morbidly obese AA female, on O2, NAD HEENT: Grossly normal. Poor dentition Neck: Supple, no JVD, carotid bruits, or masses. Cardiac: irreg irreg no murmurs, rubs, or gallops. 1+ pitting LE edema Respiratory:  Breath sounds diminished bilaterally, poor inspiratory effort GI: Soft, nontender, nondistended, BS + x 4. MS: no deformity or atrophy. Skin: warm and dry. Neuro:  Strength and sensation are intact. Psych: AAOx3.  Normal affect.  Labs    CBC  Recent Labs  06/06/16 0442 06/07/16 0412  WBC 10.6* 10.6*  HGB 7.9* 7.9*  HCT 24.4* 24.0*  MCV 94.9 94.5  PLT 177 186   Basic Metabolic Panel  Recent Labs  06/05/16 0538 06/06/16 0442 06/07/16 0412  NA 133* 134* 131*  K 3.5 3.3* 3.6  CL 96* 98* 94*  CO2 26 27 25   GLUCOSE 84 92 119*  BUN 29* 32* 34*  CREATININE 1.77* 1.83* 1.87*  CALCIUM 8.3* 8.5*  8.6*  MG 1.4* 2.0  --     Recent Labs  06/05/16 1704  TSH 4.522*    Telemetry    AF with CVR, PVCs - Personally Reviewed  ECG     CAF, LVH, IVCD, PVCs- Personally Reviewed  Radiology    Dg Chest Port 1 View  Result Date: 06/07/2016 CLINICAL DATA:  CHF short of breath EXAM: PORTABLE CHEST 1 VIEW COMPARISON:  06/04/2016 FINDINGS: Hypoventilation. Decreased lung volume with mild bibasilar atelectasis Cardiac enlargement without heart failure. CABG changes. Negative for pneumonia or effusion IMPRESSION: Hypoventilation with bibasilar atelectasis. Cardiac enlargement without heart failure. Electronically Signed   By: Marlan Palauharles  Clark M.D.   On: 06/07/2016 08:21    Cardiac Studies   Echo 05/05/16- Study Conclusions  - Left ventricle: The cavity size was normal. Wall thickness was   increased in a pattern of moderate LVH. Systolic function was   normal. The estimated ejection fraction was in the range of 50%   to 55%. Wall motion was normal; there were no regional wall    motion abnormalities. Doppler parameters are consistent with   abnormal left ventricular relaxation (grade 1 diastolic   dysfunction). - Mitral valve: Mildly calcified annulus. - Left atrium: The atrium was moderately dilated. - Tricuspid valve: There was mild-moderate regurgitation. - Pulmonary arteries: Systolic pressure was moderately increased.   PA peak pressure: 53 mm Hg (S).  Patient Profile     75 y.o.morbidly obese AA female, SNF resident,with a history of CAD-s/p CABG, DM, CRI-3, HTN, OSA, CAF,-on Eliquis,  dyslipidemia,and chronic diastolic CHF. She was just admitted 05/03/16-05/09/16 for chest pain and CHF. She is a poor historian. [facility] with suspected acute on chronic diastolic CHF, fever, increased SCr, and increased lethargy.   Assessment & Plan    Principal Problem:   Acute on chronic respiratory failure with hypoxia (HCC) Active Problems:   Hypertension   Morbid obesity and hypoventilation   Persistent atrial fibrillation (HCC)   Acute on chronic diastolic CHF   Influenza-like illness   Chronic atrial fibrillation with CVR   Chronic anticoagulation-Eliquis   Anemia-Hgb down to 7.9   Acute on chronic renal insufficiency  Plan: No significant diuresis- I/O +6.4L. Wgt-255 lbs when discharged 05/09/16, she was 237 on 05/05/16. Will discuss with MD. She is currently on Lasix 40 mg daily. Her SCr is up from baseline- she may require more aggressive diuresis and we'll have to accept higher SCR. Anemia and morbid obesity/hypoventilation also playing a role in her acute on chronic respiratory failure. I ordered stool guaiacs, she is on PPI. Consider transfusion if her Hgb continues to drop. NTG IV added yesterday to hopefully increase perfusion.   Signed, Corine ShelterLuke Kilroy, PA-C  06/07/2016, 9:16 AM   I have examined the patient and reviewed assessment and plan and discussed with patient.  Agree with above as stated.  Will increase NTG to help with BP and  treating pulmonary edema.  May benefit from transfusion, but would have to give with Lasix.  She still appears volume overloaded.  Will give one dose of Lasix this AM.  Difficult situation with refractory diastolic heart failure.  Lance MussJayadeep Tianna Baus

## 2016-06-07 NOTE — Consult Note (Signed)
   [facility] Consult   06/07/2016  [name] 06/05/1941 [id number]  Patient evaluated for community based chronic disease management services with [facility] as a benefit of patient's [employer] Medicare Insurance. Chart review reveals the patient is a 75 y.o female who presents with chest pain, SOB, decreased PO intake, and diarrhea. PMH includes CAD, HTN, chronic diastolic CHF per progress notes of PT. Spoke with patient and her daughter Lisa [name] at bedside to explain [facility] services.  Daughter and patient endoses Dr. Margaretmary BayleyPreston Clark as her primary care provider.  [facility] [place] [facility].  She lives alone.  She is being recommended for short term skilled nursing for physical and occupational therapy. Consent form signed and [facility] literature explained and given.  Patient will receive post hospital discharge follow up  and will be evaluated for monthly home visits for assessments and disease process education when she returns home from her rehab stay.  Left contact information and [facility] literature at bedside. Made Inpatient Case Manager aware that [facility] Care Management following. Of note, [facility] Care Management services does not replace or interfere with any services that are arranged by inpatient case management or social work.  For additional questions or referrals please contact:    Charlesetta ShanksVictoria Mio Schellinger, RN BSN CCM [facility] Liaison  [phone] business mobile phone Toll free office [phone]

## 2016-06-07 NOTE — Progress Notes (Signed)
Pt trasnported from chair to bed.  Utilized Sara LIft, however, pt provided NO assistance.  RN and NT were obligated to move pt.

## 2016-06-07 NOTE — NC FL2 (Signed)
[place] MEDICAID FL2 LEVEL OF CARE SCREENING TOOL     IDENTIFICATION  Patient Name: Lisa [name] Birthdate: 1941-01-03 Sex: female Admission Date (Current Location): 06/02/2016  [facility] [place]   Facility and Address:  [facility], [address], [place], [place] [address]      Provider Number: [id number]  Attending Physician Name and Address:  Elease EtienneAnand D Hongalgi, MD  Relative Name and Phone Number:       Current Level of Care: Hospital Recommended Level of Care: Skilled Nursing Facility Prior Approval Number:    Date Approved/Denied:   PASRR Number: [id number]  Discharge Plan: SNF    Current Diagnoses: Patient Active Problem List   Diagnosis Date Noted  . Chronic atrial fibrillation (HCC) 06/07/2016  . Chronic anticoagulation 06/07/2016  . Anemia 06/07/2016  . Acute on chronic renal insufficiency 06/07/2016  . Pressure injury of skin 06/06/2016  . Sepsis (HCC) 06/03/2016  . Rash 06/03/2016  . Acute on chronic diastolic CHF (congestive heart failure) (HCC) 06/03/2016  . Acute on chronic respiratory failure with hypoxia (HCC) 06/03/2016  . Influenza-like illness   . Rash and nonspecific skin eruption   . Chest pain 05/08/2016  . Acute kidney injury superimposed on chronic kidney disease (HCC)   . Renal failure   . PAF- DDCV Jan 2017 05/06/2016  . Abdominal pain   . Chest pain, atypical 05/03/2016  . Nausea, vomiting and diarrhea 05/03/2016  . Bradycardia 01/29/2016  . Persistent atrial fibrillation (HCC) 04/24/2015  . Ankle fracture 08/18/2014  . Closed right ankle fracture 08/18/2014  . Fall   . PVC (premature ventricular contraction) 03/22/2014  . SOB (shortness of breath) 09/22/2013  . Hx of CABG 05/06/2013  . Chronic diastolic CHF (congestive heart failure) (HCC) 05/06/2013  . OSA (obstructive sleep apnea) 05/06/2013  . Morbid obesity (HCC)   . Diastolic dysfunction   . Hyperlipidemia   . GERD  (gastroesophageal reflux disease) 10/27/2010  . Gout 10/27/2010  . Hypertension 10/27/2010  . Asthma 10/27/2010  . Diabetes mellitus (HCC) 10/27/2010    Orientation RESPIRATION BLADDER Height & Weight     Self, Time, Place  O2 (Nasal canula 3 L) Incontinent Weight: 251 lb 1.6 oz (113.9 kg) Height:  5\' 2"  (157.5 cm)  BEHAVIORAL SYMPTOMS/MOOD NEUROLOGICAL BOWEL NUTRITION STATUS   (None)  (None) Continent Diet (Heart healthy)  AMBULATORY STATUS COMMUNICATION OF NEEDS Skin   Extensive Assist Verbally Other (Comment) (Pressure injury (no stage listed): Buttocks. Foam daily)                       Personal Care Assistance Level of Assistance  Bathing, Feeding, Dressing Bathing Assistance: Maximum assistance Feeding assistance: Limited assistance Dressing Assistance: Maximum assistance     Functional Limitations Info  Sight, Hearing, Speech Sight Info: Adequate Hearing Info: Adequate Speech Info: Adequate    SPECIAL CARE FACTORS FREQUENCY  PT (By licensed PT), Blood pressure, Diabetic urine testing, OT (By licensed OT)     PT Frequency: 5 x week OT Frequency: 5 x week            Contractures Contractures Info: Not present    Additional Factors Info  Code Status, Allergies Code Status Info: Full Allergies Info: NKDA           Current Medications (06/07/2016):  This is the current hospital active medication list Current Facility-Administered Medications  Medication Dose Route Frequency Provider Last Rate Last Dose  .  acetaminophen (TYLENOL) tablet 650 mg  650 mg Oral Q6H PRN Clydie Braunondell A Smith, MD   650 mg at 06/05/16 0031   Or  . acetaminophen (TYLENOL) suppository 650 mg  650 mg Rectal Q6H PRN Clydie Braunondell A Smith, MD      . apixaban (ELIQUIS) tablet 5 mg  5 mg Oral BID Clydie Braunondell A Smith, MD   5 mg at 06/07/16 1022  . atorvastatin (LIPITOR) tablet 10 mg  10 mg Oral q1800 Clydie Braunondell A Smith, MD   10 mg at 06/06/16 1720  . buPROPion (WELLBUTRIN XL) 24 hr tablet 150 mg  150  mg Oral Daily Clydie Braunondell A Smith, MD   150 mg at 06/07/16 1022  . fluticasone (FLOVENT HFA) 220 MCG/ACT inhaler 2 puff  2 puff Inhalation BID PRN Clydie Braunondell A Smith, MD      . furosemide (LASIX) tablet 40 mg  40 mg Oral Daily Catarina Hartshornavid Tat, MD   40 mg at 06/07/16 1022  . gabapentin (NEURONTIN) capsule 600 mg  600 mg Oral BID Clydie Braunondell A Smith, MD   600 mg at 06/07/16 1022  . ipratropium-albuterol (DUONEB) 0.5-2.5 (3) MG/3ML nebulizer solution 3 mL  3 mL Nebulization Q6H PRN Clydie Braunondell A Smith, MD      . metoprolol succinate (TOPROL-XL) 24 hr tablet 25 mg  25 mg Oral Daily Clydie Braunondell A Smith, MD   25 mg at 06/07/16 1022  . nitroGLYCERIN 50 mg in dextrose 5 % 250 mL (0.2 mg/mL) infusion  20 mcg/min Intravenous Continuous Corky CraftsJayadeep S Varanasi, MD 6 mL/hr at 06/07/16 1023 20 mcg/min at 06/07/16 1023  . ondansetron (ZOFRAN) tablet 4 mg  4 mg Oral Q6H PRN Clydie Braunondell A Smith, MD       Or  . ondansetron (ZOFRAN) injection 4 mg  4 mg Intravenous Q6H PRN Rondell A Katrinka BlazingSmith, MD      . pantoprazole (PROTONIX) EC tablet 40 mg  40 mg Oral Daily Clydie Braunondell A Smith, MD   40 mg at 06/07/16 1022  . sodium chloride flush (NS) 0.9 % injection 3 mL  3 mL Intravenous Q12H Rondell Burtis JunesA Smith, MD   3 mL at 06/07/16 1023  . sodium chloride flush (NS) 0.9 % injection 3 mL  3 mL Intravenous PRN Clydie Braunondell A Smith, MD      . traMADol Janean Sark(ULTRAM) tablet 50 mg  50 mg Oral Q12H Rondell Burtis JunesA Smith, MD   50 mg at 06/07/16 1022     Discharge Medications: Please see discharge summary for a list of discharge medications.  Relevant Imaging Results:  Relevant Lab Results:   Additional Information SS#: [id number]. Was discharged from SNF earlier this month.  Margarito LinerSarah C Vetta Couzens, LCSW

## 2016-06-07 NOTE — Progress Notes (Signed)
Physical Therapy Treatment Patient Details Name: [name] Ausley MRN: [id number] DOB: Nov 26, 1940 Today's Date: 06/07/2016    History of Present Illness Pt is a 75 y.o female who presents with chest pain, SOB, decreased PO intake, and diarrhea. PMH includes CAD, HTN, chronic diastolic CHF.    PT Comments    Patient seen for activity progression and OOB. Patient limited by fatigue but was able to tolerated EOb activity as well at modified standing in stedy lift. Will continue to see and progress as tolerated. Continue to recommend ST SNF upon acute discharge.  Follow Up Recommendations  SNF     Equipment Recommendations  None recommended by PT    Recommendations for Other Services       Precautions / Restrictions Precautions Precautions: Fall Precaution Comments: watch O2 saturations Restrictions Weight Bearing Restrictions: No    Mobility  Bed Mobility Overal bed mobility: Needs Assistance Bed Mobility: Supine to Sit;Sit to Supine     Supine to sit: Max assist Sit to supine: Max assist;+2 for physical assistance   General bed mobility comments: HOB elevated, use of chuck pad and LUE to pull to sit using rail and therapist  Transfers Overall transfer level: Needs assistance Equipment used:  (UE support via stedy lift) Transfers: Sit to/from Stand Sit to Stand: Max assist;+2 physical assistance Stand pivot transfers:  (using lift equipment)       General transfer comment: unable to assess  Ambulation/Gait                 Stairs            Wheelchair Mobility    Modified Rankin (Stroke Patients Only)       Balance Overall balance assessment: Needs assistance Sitting-balance support: Feet supported Sitting balance-Leahy Scale: Fair Sitting balance - Comments: able to sit EOB                             Cognition Arousal/Alertness: Awake/alert Behavior During Therapy: Flat affect Overall Cognitive Status: History of cognitive  impairments - at baseline                      Exercises Other Exercises Other Exercises: performed semi sit <> stand x5 using stedy lift. Bilateral knee blocked by lift and use of UE support on rail Other Exercises: AROM Bilateral LEs, ankle pumps and LAQs with limited range sitting EOb    General Comments        Pertinent Vitals/Pain Pain Assessment: Faces Faces Pain Scale: Hurts little more Pain Location: back Pain Descriptors / Indicators: Aching Pain Intervention(s): Monitored during session    Home Living                      Prior Function            PT Goals (current goals can now be found in the care plan section) Acute Rehab PT Goals Patient Stated Goal: to get better PT Goal Formulation: With patient Time For Goal Achievement: 06/19/16 Potential to Achieve Goals: Fair Progress towards PT goals: Progressing toward goals    Frequency    Min 2X/week      PT Plan Current plan remains appropriate    Co-evaluation             End of Session Equipment Utilized During Treatment: Gait belt Activity Tolerance: Patient limited by fatigue Patient left: in chair;with call bell/phone within  reach;with chair alarm set     Time: [id number] PT Time Calculation (min) (ACUTE ONLY): 22 min  Charges:  $Therapeutic Activity: 8-22 mins                    G Codes:      Fabio AsaDevon J Karsen Fellows 06/07/2016, 9:16 AM Charlotte Crumbevon Treyten Monestime, PT DPT  [phone]

## 2016-06-07 NOTE — Progress Notes (Signed)
*  PRELIMINARY RESULTS* Vascular Ultrasound Right upper extremity venous duplex has been completed.  Preliminary findings: no obvious evidence of DVT in visualized veins. Patient refused venous compressions of brachial veins, and therefore could only evaluate with Color Doppler. No obvious occlusive DVT noted with Color Doppler.   Farrel DemarkJill Eunice, RDMS, RVT  06/07/2016, 1:00 PM

## 2016-06-07 NOTE — Care Management Important Message (Signed)
Important Message  Patient Details  Name: [name] Evitt MRN: [id number] Date of Birth: 1941-07-01   Medicare Important Message Given:  Yes    Kyla BalzarineShealy, Machele Deihl Abena 06/07/2016, 12:26 PM

## 2016-06-08 DIAGNOSIS — I10 Essential (primary) hypertension: Secondary | ICD-10-CM

## 2016-06-08 LAB — CBC
HCT: 22.4 % — ABNORMAL LOW (ref 36.0–46.0)
HEMOGLOBIN: 7.4 g/dL — AB (ref 12.0–15.0)
MCH: 31 pg (ref 26.0–34.0)
MCHC: 33 g/dL (ref 30.0–36.0)
MCV: 93.7 fL (ref 78.0–100.0)
PLATELETS: 197 10*3/uL (ref 150–400)
RBC: 2.39 MIL/uL — ABNORMAL LOW (ref 3.87–5.11)
RDW: 13.1 % (ref 11.5–15.5)
WBC: 9 10*3/uL (ref 4.0–10.5)

## 2016-06-08 LAB — BASIC METABOLIC PANEL
ANION GAP: 9 (ref 5–15)
BUN: 33 mg/dL — ABNORMAL HIGH (ref 6–20)
CALCIUM: 8.7 mg/dL — AB (ref 8.9–10.3)
CO2: 27 mmol/L (ref 22–32)
Chloride: 96 mmol/L — ABNORMAL LOW (ref 101–111)
Creatinine, Ser: 1.7 mg/dL — ABNORMAL HIGH (ref 0.44–1.00)
GFR, EST AFRICAN AMERICAN: 32 mL/min — AB (ref >60)
GFR, EST NON AFRICAN AMERICAN: 28 mL/min — AB (ref >60)
GLUCOSE: 92 mg/dL (ref 65–99)
POTASSIUM: 3.7 mmol/L (ref 3.5–5.1)
Sodium: 132 mmol/L — ABNORMAL LOW (ref 135–145)

## 2016-06-08 MED ORDER — METOLAZONE 2.5 MG PO TABS
2.5000 mg | ORAL_TABLET | Freq: Once | ORAL | Status: AC
  Administered 2016-06-08: 2.5 mg via ORAL
  Filled 2016-06-08: qty 1

## 2016-06-08 MED ORDER — FUROSEMIDE 10 MG/ML IJ SOLN
40.0000 mg | Freq: Once | INTRAMUSCULAR | Status: AC
  Administered 2016-06-08: 40 mg via INTRAVENOUS
  Filled 2016-06-08: qty 4

## 2016-06-08 NOTE — Progress Notes (Signed)
Patient Name: Lisa SchneidersGrace A Puder Date of Encounter: 06/08/2016  [facility]  Hospital Problem List     Principal Problem:   Acute on chronic respiratory failure with hypoxia [facility](HCC) Active Problems:   GERD (gastroesophageal reflux disease)   Hypertension   Morbid obesity (HCC)   Chronic diastolic CHF (congestive heart failure) (HCC)   Persistent atrial fibrillation (HCC)   Sepsis (HCC)   Acute on chronic diastolic CHF (congestive heart failure) (HCC)   Influenza-like illness   Rash and nonspecific skin eruption   Pressure injury of skin   Chronic atrial fibrillation (HCC)   Chronic anticoagulation   Anemia   Acute on chronic renal insufficiency     Subjective   Says breathing is better. Says "I hurt around my waist". Denies chest pain.  Inpatient Medications    Scheduled Meds: . apixaban  5 mg Oral BID  . atorvastatin  10 mg Oral q1800  . buPROPion  150 mg Oral Daily  . furosemide  40 mg Oral Daily  . gabapentin  600 mg Oral BID  . metoprolol succinate  25 mg Oral Daily  . pantoprazole  40 mg Oral Daily  . sodium chloride flush  3 mL Intravenous Q12H  . traMADol  50 mg Oral Q12H   Continuous Infusions: . nitroGLYCERIN 20 mcg/min (06/07/16 1023)   PRN Meds: acetaminophen **OR** acetaminophen, fluticasone, ipratropium-albuterol, ondansetron **OR** ondansetron (ZOFRAN) IV, sodium chloride flush   Vital Signs    Vitals:   06/07/16 1210 06/07/16 2009 06/08/16 0315 06/08/16 0444  BP: 109/73 (!) 146/62 (!) 157/60 (!) 150/66  Pulse: 77 71 69 72  Resp: 18 18  18   Temp: 98.5 F (36.9 C) 98.7 F (37.1 C)  98.5 F (36.9 C)  TempSrc: Oral Oral  Oral  SpO2: 100% 100%  100%  Weight:    250 lb (113.4 kg)  Height:        Intake/Output Summary (Last 24 hours) at 06/08/16 1111 Last data filed at 06/08/16 0911  Gross per 24 hour  Intake              840 ml  Output                0 ml  Net              840 ml   Filed Weights   06/06/16 0703  06/07/16 0900 06/08/16 0444  Weight: 253 lb 1.6 oz (114.8 kg) 251 lb 1.6 oz (113.9 kg) 250 lb (113.4 kg)    Physical Exam    GEN: Well nourished, well developed, in no acute distress.  HEENT: Grossly normal.  Neck: Supple, no JVD, or masses. Cardiac: Irregular rhythm, normal rate, no murmurs, rubs, or gallops. 1+ pitting pretibial edema.   Respiratory:  Diminished bilaterally. GI: Soft, obese MS: no deformity or atrophy. Neuro:  Strength and sensation are intact. Psych: AAOx3.  Normal affect.  Labs    CBC  Recent Labs  06/07/16 0412 06/08/16 0409  WBC 10.6* 9.0  HGB 7.9* 7.4*  HCT 24.0* 22.4*  MCV 94.5 93.7  PLT 186 197   Basic Metabolic Panel  Recent Labs  06/06/16 0442 06/07/16 0412 06/08/16 0409  NA 134* 131* 132*  K 3.3* 3.6 3.7  CL 98* 94* 96*  CO2 27 25 27   GLUCOSE 92 119* 92  BUN 32* 34* 33*  CREATININE 1.83* 1.87* 1.70*  CALCIUM 8.5* 8.6* 8.7*  MG 2.0  --   --  Liver Function Tests No results for input(s): AST, ALT, ALKPHOS, BILITOT, PROT, ALBUMIN in the last 72 hours. No results for input(s): LIPASE, AMYLASE in the last 72 hours. Cardiac Enzymes No results for input(s): CKTOTAL, CKMB, CKMBINDEX, TROPONINI in the last 72 hours. BNP Invalid input(s): POCBNP D-Dimer No results for input(s): DDIMER in the last 72 hours. Hemoglobin A1C No results for input(s): HGBA1C in the last 72 hours. Fasting Lipid Panel No results for input(s): CHOL, HDL, LDLCALC, TRIG, CHOLHDL, LDLDIRECT in the last 72 hours. Thyroid Function Tests  Recent Labs  06/05/16 1704  TSH 4.522*    Telemetry     - Personally Reviewed  ECG     - Personally Reviewed  Radiology    Dg Chest Port 1 View  Result Date: 06/07/2016 CLINICAL DATA:  CHF short of breath EXAM: PORTABLE CHEST 1 VIEW COMPARISON:  06/04/2016 FINDINGS: Hypoventilation. Decreased lung volume with mild bibasilar atelectasis Cardiac enlargement without heart failure. CABG changes. Negative for pneumonia  or effusion IMPRESSION: Hypoventilation with bibasilar atelectasis. Cardiac enlargement without heart failure. Electronically Signed   By: Marlan Palauharles  Clark M.D.   On: 06/07/2016 08:21    Cardiac Studies     Patient Profile     75 y.o.morbidly obese AA female, SNF resident,with a history of CAD-s/p CABG, DM, CRI-3, HTN, OSA, CAF,-on Eliquis,  dyslipidemia,and chronic diastolic CHF. She was just admitted 05/03/16-05/09/16 for chest pain and CHF. She is a poor historian. [facility] with suspected acute on chronic diastolic CHF, fever, increased SCr, and increased lethargy.   Assessment & Plan    1. Acute on chronic diastolic heart failure: No significant diuresis. On nitro drip 20 mcg/min to optimize BP control and to treat pulmonary edema. Will have to accept higher creatinine. On Lasix 40 mg daily. Will give metolazone 2.5 mg and 40 mg IV Lasix today.  Anemia and morbid obesity/hypoventilation also playing a role in her acute on chronic respiratory failure.  May need assistance from advanced HF service.  2. Atrial fibrillation: Anticoagulated with apixaban. Rate controlled with metoprolol.  3. HTN: On nitro drip, SBP still elevated. Will monitor with diuretic adjustments as noted above.   Signed, Prentice DockerSuresh Koneswaran, MD  06/08/2016, 11:11 AM

## 2016-06-08 NOTE — Progress Notes (Signed)
PROGRESS NOTE  Lisa [name] UJW:[id number] DOB: [birth year] DOA: 06/02/2016 PCP: Laurena Slimmer, MD   Brief History:  75 year old female with a history of hypertension, hyperlipidemia, diastolic CHF, atrial fibrillation, coronary artery disease status post CABG, diabetes mellitus type 2, morbid obesity, chronic respiratory failure on 2 L presented from home secondary to somnolence, lethargy, and increasing shortness of breath. The patient was recently discharged from a skilled nursing facility 3 days prior to this admission and has been living at home with her family. Upon arrival, EMS noted that the patient's O2 saturations were 84% on room air. The patient is a poor historian, but family states that that she has had myalgias and arthralgias for the past 2-3 days prior to this admission. Upon presentation, the patient was noted to have temperature 100.21F with heart rate 105 requiring up to 4 L nasal cannula to maintain adequate oxygen saturation. There was concern for pulmonary infection; therefore, the patient was started on intravenous Zosyn and vancomycin. As the patient improved clinically and remained afebrile, antibiotics were discontinued on 06/04/2016. The patient was started on IV furosemide with good clinical effect.  Assessment/Plan: Acute on chronic respiratory failure with hypoxia -Secondary to decompensated CHF - At baseline, patient is on 2 L nasal cannula. Wean oxygen as tolerated back to baseline. Currently on 3 L/m. -patient received 3.7 L as part of the sepsis protocol -Lactic acid 1.38 -check PCT--0.40 -check flu PCR--neg  -viral respiratory panel--neg  Acute on chronic diastolic CHF -Unfortunately, the patient is a poor historian -During her office visit 05/03/2016, the patient's weight was 232 pounds -Admission weight 254 pounds -Certainly, the patient's albumin may be contributing to a degree of third spacing - Started intravenous furosemide-->switch  to po lasix 11/28 pm. Creatinine gradually increased from 1.47 on 11/27 to 1.83 on 11/30. She remained volume overloaded. We may have to accept higher creatinine related to diuresis. Cardiology was consulted >started NTG infusion to help with blood pressure and treat pulmonary edema and recommend considering PRBC transfusion if hemoglobin drops further. - Cardiology titrating NTG drip, added IV Lasix and metolazone. Continue monitoring.  Fever -sepsis ruled out -flu--neg - viral respiratory panel: Negative. -UA neg for pyria -CXR-no consolidation  Persistent Atrial fibrillation -Continue apixaban -Rate controlled -Continue metoprolol succinate -CHADSVASc = 6 (CHF, HTN, Age, CAD, Female)  Acute Encephalopathy -multifactorial including possible infection, decompensated CHF and acute on chronic renal failure -check B12--406 -TSH--7.344--> repeated TSH: 4.52 and free T4: 1.44. Clinically euthyroid. Recommend repeating full TFTs in 4-6 weeks. -ammonia--22 -improving, close to baseline 11/28 per daughter  Acute on chronic renal failure--CKD stage III -Baseline creatinine 1.1-1.4 -Monitor with diuresis. Creatinine gradually increased to 1.8. Creatinine has slightly improved to 1.7. Continue to monitor  Hypertension -Improvement with diuresis -Continue metoprolol succinate -d/c amlodpine as BP dropping with diuresis. IV NTG infusion started by cardiology on 11/30.  Foot rash -This appears to be ecchymosis on examination--nonblanching -Suspect the patient had recent trauma in the setting of apixiban use -No signs of infection  OSA  -- the patient is tolerating CPAP therapy well without any problems at home.   Hyperlipidemia -continue statin  Hypomagnesemia - Replaced.  Anemia - No bleeding reported. Hemoglobin gradually dropping 9.5 > 7.9 >7.9 >7.4. No bleeding reported. Follow CBC in a.m. Transfuse if hemoglobin <7 g per DL.  Coagulase negative blood culture in one of  2 bottles - Likely contaminant. No intervention.  Hypokalemia - Replaced.  Right  upper extremity swelling - Patient states that she has had this for approximately 2 weeks. Denies pain. May be asymmetrical edema . Venous Doppler negative for DVT. Elevated extremity. Good peripheral pulsations. Improving.   Disposition Plan:   as per PT evaluation, recommend SNF at discharge. Social work Administrator, sportsconsultation. Not medically stable for discharge. Family Communication:   none at bedside.   Consultants:   cardiology  Code Status:  FULL  DVT Prophylaxis: apixaban   Procedures: As Listed in Progress Note Above  Antibiotics: None    Subjective: Mild intermittent dry cough and bilateral lower chest/upper abdominal pain with coughing. Intermittent dyspnea without significant change in the last couple days. Right upper extremity swelling improving.  Objective: Vitals:   06/07/16 2009 06/08/16 0315 06/08/16 0444 06/08/16 1204  BP: (!) 146/62 (!) 157/60 (!) 150/66 (!) 128/53  Pulse: 71 69 72 75  Resp: 18  18 18   Temp: 98.7 F (37.1 C)  98.5 F (36.9 C) 98.1 F (36.7 C)  TempSrc: Oral  Oral Oral  SpO2: 100%  100% 93%  Weight:   113.4 kg (250 lb)   Height:        Intake/Output Summary (Last 24 hours) at 06/08/16 1520 Last data filed at 06/08/16 [id number]  Gross per 24 hour  Intake              720 ml  Output                0 ml  Net              720 ml   Weight change: -0.907 kg (-2 lb) Exam:   General:  Pleasant elderly female, morbidly obese, sitting up in bed.   HEENT: No icterus, No thrush, No neck mass, [place]/AT  Cardiovascular: RRR, S1/S2, no rubs, no gallops. Trace leg edema. Telemetry: PAF/SR with BBB morphology.  Respiratory: Diminished breath sounds in the bases with occasional bibasal crackles. Poor inspiratory effort. No wheezing. Good air movement. Rest of lung fields clear to auscultation. No increased work of breathing.   Abdomen: Soft/+BS, non tender, non  distended, no guarding  Extremities: Diffuse swelling of right upper extremity, more so distally in the hand and forearm with any other acute findings. Good pulses felt. Normal color. Right upper extremity swelling has improved.   Data Reviewed: I have personally reviewed following labs and imaging studies Basic Metabolic Panel:  Recent Labs Lab 06/04/16 0326 06/05/16 0538 06/06/16 0442 06/07/16 0412 06/08/16 0409  NA 136 133* 134* 131* 132*  K 3.8 3.5 3.3* 3.6 3.7  CL 100* 96* 98* 94* 96*  CO2 26 26 27 25 27   GLUCOSE 90 84 92 119* 92  BUN 23* 29* 32* 34* 33*  CREATININE 1.60* 1.77* 1.83* 1.87* 1.70*  CALCIUM 8.7* 8.3* 8.5* 8.6* 8.7*  MG 1.4* 1.4* 2.0  --   --    Liver Function Tests:  Recent Labs Lab 06/02/16 1914 06/03/16 0511  AST 23 28  ALT 13* 12*  ALKPHOS 102 87  BILITOT 1.3* 1.3*  PROT 6.8 6.5  ALBUMIN 2.6* 2.4*   No results for input(s): LIPASE, AMYLASE in the last 168 hours.  Recent Labs Lab 06/04/16 0326  AMMONIA 22   Coagulation Profile:  Recent Labs Lab 06/02/16 1914  INR 2.68   CBC:  Recent Labs Lab 06/02/16 1914 06/03/16 0511 06/06/16 0442 06/07/16 0412 06/08/16 0409  WBC 10.6* 11.0* 10.6* 10.6* 9.0  NEUTROABS 8.2*  --   --   --   --  HGB 9.1* 8.4* 7.9* 7.9* 7.4*  HCT 27.8* 26.1* 24.4* 24.0* 22.4*  MCV 96.2 96.7 94.9 94.5 93.7  PLT 174 182 177 186 197   Cardiac Enzymes: No results for input(s): CKTOTAL, CKMB, CKMBINDEX, TROPONINI in the last 168 hours. BNP: Invalid input(s): POCBNP CBG:  Recent Labs Lab 06/03/16 0312  GLUCAP 95   HbA1C: No results for input(s): HGBA1C in the last 72 hours. Urine analysis:    Component Value Date/Time   COLORURINE YELLOW 06/02/2016 2235   APPEARANCEUR CLEAR 06/02/2016 2235   LABSPEC 1.012 06/02/2016 2235   PHURINE 7.5 06/02/2016 2235   GLUCOSEU NEGATIVE 06/02/2016 2235   HGBUR SMALL (A) 06/02/2016 2235   BILIRUBINUR NEGATIVE 06/02/2016 2235   KETONESUR NEGATIVE 06/02/2016 2235     PROTEINUR 100 (A) 06/02/2016 2235   UROBILINOGEN 1.0 08/20/2014 1130   NITRITE NEGATIVE 06/02/2016 2235   LEUKOCYTESUR NEGATIVE 06/02/2016 2235   Sepsis Labs: @LABRCNTIP (procalcitonin:4,lacticidven:4) ) Recent Results (from the past 240 hour(s))  Blood culture (routine x 2)     Status: Abnormal   Collection Time: 06/02/16  2:13 PM  Result Value Ref Range Status   Specimen Description BLOOD RIGHT ANTECUBITAL  Final   Special Requests BOTTLES DRAWN AEROBIC AND ANAEROBIC 5CC  Final   Culture  Setup Time   Final    GRAM POSITIVE COCCI IN CLUSTERS IN BOTH AEROBIC AND ANAEROBIC BOTTLES CRITICAL RESULT CALLED TO, READ BACK BY AND VERIFIED WITH: A. Reece LeaderAnderson Pharm.D. 11:15 06/03/16  ([email or web address])    Culture (A)  Final    STAPHYLOCOCCUS SPECIES (COAGULASE NEGATIVE) THE SIGNIFICANCE OF ISOLATING THIS ORGANISM FROM A SINGLE SET OF BLOOD CULTURES WHEN MULTIPLE SETS ARE DRAWN IS UNCERTAIN. PLEASE NOTIFY THE MICROBIOLOGY DEPARTMENT WITHIN ONE WEEK IF SPECIATION AND SENSITIVITIES ARE REQUIRED.    Report Status 06/05/2016 FINAL  Final  Blood Culture ID Panel (Reflexed)     Status: Abnormal   Collection Time: 06/02/16  2:13 PM  Result Value Ref Range Status   Enterococcus species NOT DETECTED NOT DETECTED Final   Listeria monocytogenes NOT DETECTED NOT DETECTED Final   Staphylococcus species DETECTED (A) NOT DETECTED Final    Comment: CRITICAL RESULT CALLED TO, READ BACK BY AND VERIFIED WITH: A. Reece LeaderAnderson Pharm.D. 11:15 06/03/16 ([email or web address])    Staphylococcus aureus NOT DETECTED NOT DETECTED Final   Methicillin resistance DETECTED (A) NOT DETECTED Final    Comment: CRITICAL RESULT CALLED TO, READ BACK BY AND VERIFIED WITH: A. Reece LeaderAnderson Pharm.D. 11:15 06/03/16 ([email or web address])    Streptococcus species NOT DETECTED NOT DETECTED Final   Streptococcus agalactiae NOT DETECTED NOT DETECTED Final   Streptococcus pneumoniae NOT DETECTED NOT DETECTED Final   Streptococcus pyogenes NOT DETECTED NOT DETECTED Final    Acinetobacter baumannii NOT DETECTED NOT DETECTED Final   Enterobacteriaceae species NOT DETECTED NOT DETECTED Final   Enterobacter cloacae complex NOT DETECTED NOT DETECTED Final   Escherichia coli NOT DETECTED NOT DETECTED Final   Klebsiella oxytoca NOT DETECTED NOT DETECTED Final   Klebsiella pneumoniae NOT DETECTED NOT DETECTED Final   Proteus species NOT DETECTED NOT DETECTED Final   Serratia marcescens NOT DETECTED NOT DETECTED Final   Haemophilus influenzae NOT DETECTED NOT DETECTED Final   Neisseria meningitidis NOT DETECTED NOT DETECTED Final   Pseudomonas aeruginosa NOT DETECTED NOT DETECTED Final   Candida albicans NOT DETECTED NOT DETECTED Final   Candida glabrata NOT DETECTED NOT DETECTED Final   Candida krusei NOT DETECTED NOT DETECTED Final   Candida parapsilosis NOT DETECTED NOT DETECTED Final  Candida tropicalis NOT DETECTED NOT DETECTED Final  Blood culture (routine x 2)     Status: None   Collection Time: 06/02/16  7:10 PM  Result Value Ref Range Status   Specimen Description BLOOD LEFT ANTECUBITAL  Final   Special Requests BOTTLES DRAWN AEROBIC AND ANAEROBIC 5CC  Final   Culture NO GROWTH 5 DAYS  Final   Report Status 06/07/2016 FINAL  Final  Urine culture     Status: Abnormal   Collection Time: 06/02/16 10:35 PM  Result Value Ref Range Status   Specimen Description URINE, CATHETERIZED  Final   Special Requests NONE  Final   Culture MULTIPLE SPECIES PRESENT, SUGGEST RECOLLECTION (A)  Final   Report Status 06/05/2016 FINAL  Final  MRSA PCR Screening     Status: None   Collection Time: 06/03/16  9:28 AM  Result Value Ref Range Status   MRSA by PCR NEGATIVE NEGATIVE Final    Comment:        The GeneXpert MRSA Assay (FDA approved for NASAL specimens only), is one component of a comprehensive MRSA colonization surveillance program. It is not intended to diagnose MRSA infection nor to guide or monitor treatment for MRSA infections.   Respiratory Panel by  PCR     Status: None   Collection Time: 06/03/16  9:43 AM  Result Value Ref Range Status   Adenovirus NOT DETECTED NOT DETECTED Final   Coronavirus 229E NOT DETECTED NOT DETECTED Final   Coronavirus HKU1 NOT DETECTED NOT DETECTED Final   Coronavirus NL63 NOT DETECTED NOT DETECTED Final   Coronavirus OC43 NOT DETECTED NOT DETECTED Final   Metapneumovirus NOT DETECTED NOT DETECTED Final   Rhinovirus / Enterovirus NOT DETECTED NOT DETECTED Final   Influenza A NOT DETECTED NOT DETECTED Final   Influenza B NOT DETECTED NOT DETECTED Final   Parainfluenza Virus 1 NOT DETECTED NOT DETECTED Final   Parainfluenza Virus 2 NOT DETECTED NOT DETECTED Final   Parainfluenza Virus 3 NOT DETECTED NOT DETECTED Final   Parainfluenza Virus 4 NOT DETECTED NOT DETECTED Final   Respiratory Syncytial Virus NOT DETECTED NOT DETECTED Final   Bordetella pertussis NOT DETECTED NOT DETECTED Final   Chlamydophila pneumoniae NOT DETECTED NOT DETECTED Final   Mycoplasma pneumoniae NOT DETECTED NOT DETECTED Final     Scheduled Meds: . apixaban  5 mg Oral BID  . atorvastatin  10 mg Oral q1800  . buPROPion  150 mg Oral Daily  . furosemide  40 mg Oral Daily  . gabapentin  600 mg Oral BID  . metoprolol succinate  25 mg Oral Daily  . pantoprazole  40 mg Oral Daily  . sodium chloride flush  3 mL Intravenous Q12H  . traMADol  50 mg Oral Q12H   Continuous Infusions: . nitroGLYCERIN 20 mcg/min (06/07/16 1023)    Procedures/Studies: Dg Chest 2 View  Result Date: 06/02/2016 CLINICAL DATA:  Trouble breathing today, weakness EXAM: CHEST  2 VIEW COMPARISON:  05/05/2016 FINDINGS: Median sternotomy wires are again evident. Discoid atelectasis in the right mid lung zone. Linear atelectasis in the left upper lobe. Low lung volumes. Suspect tiny left pleural effusion. Mild to moderate cardiomegaly with mild central congestion. No pneumothorax. Partially visualized spinal hardware. IMPRESSION: 1. Low lung volumes. 2. Linear  atelectasis in the right lower lung zone. Linear atelectasis in the left upper lobe. 3. Stable cardiomegaly. Mild central vascular congestion. Suspect tiny left effusion. Electronically Signed   By: Jasmine Pang M.D.   On: 06/02/2016 23:42  Dg Chest Port 1 View  Result Date: 06/07/2016 CLINICAL DATA:  CHF short of breath EXAM: PORTABLE CHEST 1 VIEW COMPARISON:  06/04/2016 FINDINGS: Hypoventilation. Decreased lung volume with mild bibasilar atelectasis Cardiac enlargement without heart failure. CABG changes. Negative for pneumonia or effusion IMPRESSION: Hypoventilation with bibasilar atelectasis. Cardiac enlargement without heart failure. Electronically Signed   By: Marlan Palau M.D.   On: 06/07/2016 08:21    Lisa Leber, MD, FACP, FHM. [facility] Pager [phone]  If 7PM-7AM, please contact night-coverage [email or web address] Password [email or web address] 06/08/2016, 3:20 PM   LOS: 5 days

## 2016-06-09 LAB — BASIC METABOLIC PANEL
Anion gap: 10 (ref 5–15)
BUN: 35 mg/dL — AB (ref 6–20)
CHLORIDE: 94 mmol/L — AB (ref 101–111)
CO2: 27 mmol/L (ref 22–32)
Calcium: 8.5 mg/dL — ABNORMAL LOW (ref 8.9–10.3)
Creatinine, Ser: 1.71 mg/dL — ABNORMAL HIGH (ref 0.44–1.00)
GFR calc Af Amer: 32 mL/min — ABNORMAL LOW (ref >60)
GFR calc non Af Amer: 28 mL/min — ABNORMAL LOW (ref >60)
GLUCOSE: 139 mg/dL — AB (ref 65–99)
POTASSIUM: 3.6 mmol/L (ref 3.5–5.1)
Sodium: 131 mmol/L — ABNORMAL LOW (ref 135–145)

## 2016-06-09 LAB — CBC
HCT: 22.5 % — ABNORMAL LOW (ref 36.0–46.0)
HEMOGLOBIN: 7.3 g/dL — AB (ref 12.0–15.0)
MCH: 30.7 pg (ref 26.0–34.0)
MCHC: 32.4 g/dL (ref 30.0–36.0)
MCV: 94.5 fL (ref 78.0–100.0)
PLATELETS: 226 10*3/uL (ref 150–400)
RBC: 2.38 MIL/uL — AB (ref 3.87–5.11)
RDW: 13.4 % (ref 11.5–15.5)
WBC: 9.5 10*3/uL (ref 4.0–10.5)

## 2016-06-09 LAB — PREPARE RBC (CROSSMATCH)

## 2016-06-09 MED ORDER — METOLAZONE 2.5 MG PO TABS
2.5000 mg | ORAL_TABLET | Freq: Once | ORAL | Status: AC
  Administered 2016-06-09: 2.5 mg via ORAL
  Filled 2016-06-09: qty 1

## 2016-06-09 MED ORDER — POLYETHYLENE GLYCOL 3350 17 G PO PACK
17.0000 g | PACK | Freq: Every day | ORAL | Status: DC
  Administered 2016-06-09 – 2016-06-11 (×3): 17 g via ORAL
  Filled 2016-06-09 (×4): qty 1

## 2016-06-09 MED ORDER — SODIUM CHLORIDE 0.9 % IV SOLN
Freq: Once | INTRAVENOUS | Status: AC
  Administered 2016-06-09: 14:00:00 via INTRAVENOUS

## 2016-06-09 MED ORDER — SENNA 8.6 MG PO TABS
2.0000 | ORAL_TABLET | Freq: Every day | ORAL | Status: DC
  Administered 2016-06-09 – 2016-06-13 (×4): 17.2 mg via ORAL
  Filled 2016-06-09 (×4): qty 2

## 2016-06-09 MED ORDER — FUROSEMIDE 10 MG/ML IJ SOLN
40.0000 mg | Freq: Once | INTRAMUSCULAR | Status: AC
  Administered 2016-06-09: 40 mg via INTRAVENOUS
  Filled 2016-06-09: qty 4

## 2016-06-09 NOTE — Progress Notes (Signed)
Pt has a order to transfuse 1 unit of blood, but Lab said that blood will not be ready until tomorrow (pt has a antibody and blood is requested via suppliers), will pass information to carry out tomorrow when available tomorrow.  Lonia Farberekha, RN

## 2016-06-09 NOTE — Progress Notes (Addendum)
PROGRESS NOTE  Lisa SchneidersGrace A [name] NWG:[id number] DOB: 24-Dec-1940 DOA: 06/02/2016 PCP: [name],[name], MD   Brief History:  75 year old female with a history of hypertension, hyperlipidemia, diastolic CHF, atrial fibrillation, coronary artery disease status post CABG, diabetes mellitus type 2, morbid obesity, chronic respiratory failure on 2 L presented from home secondary to somnolence, lethargy, and increasing shortness of breath. The patient was recently discharged from a skilled nursing facility 3 days prior to this admission and has been living at home with her family. Upon arrival, EMS noted that the patient's O2 saturations were 84% on room air. The patient is a poor historian, but family states that that she has had myalgias and arthralgias for the past 2-3 days prior to this admission. Upon presentation, the patient was noted to have temperature 100.62F with heart rate 105 requiring up to 4 L nasal cannula to maintain adequate oxygen saturation. There was concern for pulmonary infection; therefore, the patient was started on intravenous Zosyn and vancomycin. As the patient improved clinically and remained afebrile, antibiotics were discontinued on 06/04/2016. The patient was started on IV furosemide with good clinical effect.  Assessment/Plan: Acute on chronic respiratory failure with hypoxia -Secondary to decompensated CHF - At baseline, patient is on 2 L nasal cannula. Wean oxygen as tolerated back to baseline. Currently on 3 L/m. -patient received 3.7 L as part of the sepsis protocol -Lactic acid 1.38 -check PCT--0.40 -check flu PCR--neg  -viral respiratory panel--neg  Acute on chronic diastolic CHF -Unfortunately, the patient is a poor historian -During her office visit 05/03/2016, the patient's weight was 232 pounds -Admission weight 254 pounds -Certainly, the patient's albumin may be contributing to a degree of third spacing - Started intravenous furosemide-->switch  to po lasix 11/28 pm. Creatinine gradually increased from 1.47 on 11/27 to 1.83 on 11/30. She remained volume overloaded. We may have to accept higher creatinine related to diuresis. Cardiology was consulted >started NTG infusion to help with blood pressure and treat pulmonary edema and recommend considering PRBC transfusion if hemoglobin drops further. - Cardiology titrating NTG drip, added IV Lasix and metolazone. Continue monitoring. Transfuse 1 unit PRBC which should help. Patient agreeable.  Fever -sepsis ruled out -flu--neg - viral respiratory panel: Negative. -UA neg for pyria -CXR-no consolidation  Persistent Atrial fibrillation -Continue apixaban -Rate controlled -Continue metoprolol succinate -CHADSVASc = 6 (CHF, HTN, Age, CAD, Female)  Acute Encephalopathy -multifactorial including possible infection, decompensated CHF and acute on chronic renal failure -check B12--406 -TSH--7.344--> repeated TSH: 4.52 and free T4: 1.44. Clinically euthyroid. Recommend repeating full TFTs in 4-6 weeks. -ammonia--22 -improving, close to baseline 11/28 per daughter  Acute on chronic renal failure--CKD stage III -Baseline creatinine 1.1-1.4 -Monitor with diuresis. Creatinine gradually increased to 1.8. Creatinine has slightly improved to 1.7. Continue to monitor  Hypertension -Improvement with diuresis -Continue metoprolol succinate -d/c amlodpine as BP dropping with diuresis. IV NTG infusion started by cardiology on 11/30.  Foot rash -This appears to be ecchymosis on examination--nonblanching -Suspect the patient had recent trauma in the setting of apixiban use -No signs of infection  OSA  -- the patient is tolerating CPAP therapy well without any problems at home.   Hyperlipidemia -continue statin  Hypomagnesemia - Replaced.  Anemia - No bleeding reported. Hemoglobin gradually dropping 9.5 > 7.9 >7.9 >7.4 >7.3. No overt bleeding reported. Transfuse 1 unit of PRBC.  Patient has already received IV Lasix and metolazone. Follow CBC in a.m.  Coagulase negative blood  culture in one of 2 bottles - Likely contaminant. No intervention.  Hypokalemia - Replaced.  Right upper extremity swelling - Patient states that she has had this for approximately 2 weeks. Denies pain. May be asymmetrical edema . Venous Doppler negative for DVT. Elevated extremity. Good peripheral pulsations. Improving.   Disposition Plan:   as per PT evaluation, recommend SNF at discharge. Social work Administrator, sports. Not medically stable for discharge. Family Communication:   discussed in detail with patient's daughter on 12/3. Daughter lives in [place] and is visiting patient here. She states that patient was at [facility] for approximately 2 weeks, returned home during Thanksgiving and readmitted within 3 or 4 days. She was staying with patient for those couple of days. Advised her that patient has been declining and has poor overall short and long-term prognosis. Daughter is interested in meeting with palliative care team-consulted.   Consultants:   cardiology  Code Status:  FULL  DVT Prophylaxis: apixaban   Procedures: As Listed in Progress Note Above  Antibiotics: None    Subjective: Complaints of upper abdominal pain from coughing. No worsening dyspnea. Urinary incontinence.  Objective: Vitals:   06/08/16 1204 06/08/16 2049 06/09/16 0124 06/09/16 0500  BP: (!) 128/53 (!) 154/70 (!) 162/57 105/60  Pulse: 75 73 79 78  Resp: 18 18  18   Temp: 98.1 F (36.7 C) 99.1 F (37.3 C) 98.6 F (37 C) 98.3 F (36.8 C)  TempSrc: Oral Oral Oral Oral  SpO2: 93% 100% 99% 98%  Weight:    114.3 kg (252 lb)  Height:        Intake/Output Summary (Last 24 hours) at 06/09/16 1332 Last data filed at 06/09/16 0200  Gross per 24 hour  Intake           877.65 ml  Output                0 ml  Net           877.65 ml   Weight change: 0.408 kg (14.4 oz) Exam:   General:  Pleasant  elderly female, morbidly obese, sitting up in bed.Chronically ill-looking.   HEENT: No icterus, No thrush, No neck mass, [place]/AT  Cardiovascular: RRR, S1/S2, no rubs, no gallops. Trace leg edema. Telemetry: PAF/SR with BBB morphology.  Respiratory: Diminished breath sounds in the bases with occasional bibasal crackles. Poor inspiratory effort. No wheezing. Good air movement. Rest of lung fields clear to auscultation. No increased work of breathing.   Abdomen: Soft/+BS, non tender, non distended, no guarding  Extremities: Diffuse swelling of right upper extremity, more so distally in the hand and forearm with any other acute findings. Good pulses felt. Normal color. Right upper extremity swelling has improved.   Data Reviewed: I have personally reviewed following labs and imaging studies Basic Metabolic Panel:  Recent Labs Lab 06/04/16 0326 06/05/16 0538 06/06/16 0442 06/07/16 0412 06/08/16 0409 06/09/16 0443  NA 136 133* 134* 131* 132* 131*  K 3.8 3.5 3.3* 3.6 3.7 3.6  CL 100* 96* 98* 94* 96* 94*  CO2 26 26 27 25 27 27   GLUCOSE 90 84 92 119* 92 139*  BUN 23* 29* 32* 34* 33* 35*  CREATININE 1.60* 1.77* 1.83* 1.87* 1.70* 1.71*  CALCIUM 8.7* 8.3* 8.5* 8.6* 8.7* 8.5*  MG 1.4* 1.4* 2.0  --   --   --    Liver Function Tests:  Recent Labs Lab 06/02/16 1914 06/03/16 0511  AST 23 28  ALT 13* 12*  ALKPHOS 102  87  BILITOT 1.3* 1.3*  PROT 6.8 6.5  ALBUMIN 2.6* 2.4*   No results for input(s): LIPASE, AMYLASE in the last 168 hours.  Recent Labs Lab 06/04/16 0326  AMMONIA 22   Coagulation Profile:  Recent Labs Lab 06/02/16 1914  INR 2.68   CBC:  Recent Labs Lab 06/02/16 1914 06/03/16 0511 06/06/16 0442 06/07/16 0412 06/08/16 0409 06/09/16 0443  WBC 10.6* 11.0* 10.6* 10.6* 9.0 9.5  NEUTROABS 8.2*  --   --   --   --   --   HGB 9.1* 8.4* 7.9* 7.9* 7.4* 7.3*  HCT 27.8* 26.1* 24.4* 24.0* 22.4* 22.5*  MCV 96.2 96.7 94.9 94.5 93.7 94.5  PLT 174 182 177 186 197 226    Cardiac Enzymes: No results for input(s): CKTOTAL, CKMB, CKMBINDEX, TROPONINI in the last 168 hours. BNP: Invalid input(s): POCBNP CBG:  Recent Labs Lab 06/03/16 0312  GLUCAP 95   HbA1C: No results for input(s): HGBA1C in the last 72 hours. Urine analysis:    Component Value Date/Time   COLORURINE YELLOW 06/02/2016 2235   APPEARANCEUR CLEAR 06/02/2016 2235   LABSPEC 1.012 06/02/2016 2235   PHURINE 7.5 06/02/2016 2235   GLUCOSEU NEGATIVE 06/02/2016 2235   HGBUR SMALL (A) 06/02/2016 2235   BILIRUBINUR NEGATIVE 06/02/2016 2235   KETONESUR NEGATIVE 06/02/2016 2235   PROTEINUR 100 (A) 06/02/2016 2235   UROBILINOGEN 1.0 08/20/2014 1130   NITRITE NEGATIVE 06/02/2016 2235   LEUKOCYTESUR NEGATIVE 06/02/2016 2235   Sepsis Labs: @LABRCNTIP (procalcitonin:4,lacticidven:4) ) Recent Results (from the past 240 hour(s))  Blood culture (routine x 2)     Status: Abnormal   Collection Time: 06/02/16  2:13 PM  Result Value Ref Range Status   Specimen Description BLOOD RIGHT ANTECUBITAL  Final   Special Requests BOTTLES DRAWN AEROBIC AND ANAEROBIC 5CC  Final   Culture  Setup Time   Final    GRAM POSITIVE COCCI IN CLUSTERS IN BOTH AEROBIC AND ANAEROBIC BOTTLES CRITICAL RESULT CALLED TO, READ BACK BY AND VERIFIED WITH: A. Reece Leader.D. 11:15 06/03/16  ([email or web address])    Culture (A)  Final    STAPHYLOCOCCUS SPECIES (COAGULASE NEGATIVE) THE SIGNIFICANCE OF ISOLATING THIS ORGANISM FROM A SINGLE SET OF BLOOD CULTURES WHEN MULTIPLE SETS ARE DRAWN IS UNCERTAIN. PLEASE NOTIFY THE MICROBIOLOGY DEPARTMENT WITHIN ONE WEEK IF SPECIATION AND SENSITIVITIES ARE REQUIRED.    Report Status 06/05/2016 FINAL  Final  Blood Culture ID Panel (Reflexed)     Status: Abnormal   Collection Time: 06/02/16  2:13 PM  Result Value Ref Range Status   Enterococcus species NOT DETECTED NOT DETECTED Final   Listeria monocytogenes NOT DETECTED NOT DETECTED Final   Staphylococcus species DETECTED (A) NOT DETECTED Final      Comment: CRITICAL RESULT CALLED TO, READ BACK BY AND VERIFIED WITH: A. Reece Leader.D. 11:15 06/03/16 ([email or web address])    Staphylococcus aureus NOT DETECTED NOT DETECTED Final   Methicillin resistance DETECTED (A) NOT DETECTED Final    Comment: CRITICAL RESULT CALLED TO, READ BACK BY AND VERIFIED WITH: A. Reece Leader.D. 11:15 06/03/16 ([email or web address])    Streptococcus species NOT DETECTED NOT DETECTED Final   Streptococcus agalactiae NOT DETECTED NOT DETECTED Final   Streptococcus pneumoniae NOT DETECTED NOT DETECTED Final   Streptococcus pyogenes NOT DETECTED NOT DETECTED Final   Acinetobacter baumannii NOT DETECTED NOT DETECTED Final   Enterobacteriaceae species NOT DETECTED NOT DETECTED Final   Enterobacter cloacae complex NOT DETECTED NOT DETECTED Final   Escherichia coli NOT DETECTED NOT DETECTED Final   Klebsiella  oxytoca NOT DETECTED NOT DETECTED Final   Klebsiella pneumoniae NOT DETECTED NOT DETECTED Final   Proteus species NOT DETECTED NOT DETECTED Final   Serratia marcescens NOT DETECTED NOT DETECTED Final   Haemophilus influenzae NOT DETECTED NOT DETECTED Final   Neisseria meningitidis NOT DETECTED NOT DETECTED Final   Pseudomonas aeruginosa NOT DETECTED NOT DETECTED Final   Candida albicans NOT DETECTED NOT DETECTED Final   Candida glabrata NOT DETECTED NOT DETECTED Final   Candida krusei NOT DETECTED NOT DETECTED Final   Candida parapsilosis NOT DETECTED NOT DETECTED Final   Candida tropicalis NOT DETECTED NOT DETECTED Final  Blood culture (routine x 2)     Status: None   Collection Time: 06/02/16  7:10 PM  Result Value Ref Range Status   Specimen Description BLOOD LEFT ANTECUBITAL  Final   Special Requests BOTTLES DRAWN AEROBIC AND ANAEROBIC 5CC  Final   Culture NO GROWTH 5 DAYS  Final   Report Status 06/07/2016 FINAL  Final  Urine culture     Status: Abnormal   Collection Time: 06/02/16 10:35 PM  Result Value Ref Range Status   Specimen Description URINE, CATHETERIZED   Final   Special Requests NONE  Final   Culture MULTIPLE SPECIES PRESENT, SUGGEST RECOLLECTION (A)  Final   Report Status 06/05/2016 FINAL  Final  MRSA PCR Screening     Status: None   Collection Time: 06/03/16  9:28 AM  Result Value Ref Range Status   MRSA by PCR NEGATIVE NEGATIVE Final    Comment:        The GeneXpert MRSA Assay (FDA approved for NASAL specimens only), is one component of a comprehensive MRSA colonization surveillance program. It is not intended to diagnose MRSA infection nor to guide or monitor treatment for MRSA infections.   Respiratory Panel by PCR     Status: None   Collection Time: 06/03/16  9:43 AM  Result Value Ref Range Status   Adenovirus NOT DETECTED NOT DETECTED Final   Coronavirus 229E NOT DETECTED NOT DETECTED Final   Coronavirus HKU1 NOT DETECTED NOT DETECTED Final   Coronavirus NL63 NOT DETECTED NOT DETECTED Final   Coronavirus OC43 NOT DETECTED NOT DETECTED Final   Metapneumovirus NOT DETECTED NOT DETECTED Final   Rhinovirus / Enterovirus NOT DETECTED NOT DETECTED Final   Influenza A NOT DETECTED NOT DETECTED Final   Influenza B NOT DETECTED NOT DETECTED Final   Parainfluenza Virus 1 NOT DETECTED NOT DETECTED Final   Parainfluenza Virus 2 NOT DETECTED NOT DETECTED Final   Parainfluenza Virus 3 NOT DETECTED NOT DETECTED Final   Parainfluenza Virus 4 NOT DETECTED NOT DETECTED Final   Respiratory Syncytial Virus NOT DETECTED NOT DETECTED Final   Bordetella pertussis NOT DETECTED NOT DETECTED Final   Chlamydophila pneumoniae NOT DETECTED NOT DETECTED Final   Mycoplasma pneumoniae NOT DETECTED NOT DETECTED Final     Scheduled Meds: . sodium chloride   Intravenous Once  . apixaban  5 mg Oral BID  . atorvastatin  10 mg Oral q1800  . buPROPion  150 mg Oral Daily  . furosemide  40 mg Oral Daily  . gabapentin  600 mg Oral BID  . metoprolol succinate  25 mg Oral Daily  . pantoprazole  40 mg Oral Daily  . sodium chloride flush  3 mL Intravenous  Q12H  . traMADol  50 mg Oral Q12H   Continuous Infusions: . nitroGLYCERIN 10 mcg/min (06/09/16 1157)    Procedures/Studies: Dg Chest 2 View  Result Date: 06/02/2016  CLINICAL DATA:  Trouble breathing today, weakness EXAM: CHEST  2 VIEW COMPARISON:  05/05/2016 FINDINGS: Median sternotomy wires are again evident. Discoid atelectasis in the right mid lung zone. Linear atelectasis in the left upper lobe. Low lung volumes. Suspect tiny left pleural effusion. Mild to moderate cardiomegaly with mild central congestion. No pneumothorax. Partially visualized spinal hardware. IMPRESSION: 1. Low lung volumes. 2. Linear atelectasis in the right lower lung zone. Linear atelectasis in the left upper lobe. 3. Stable cardiomegaly. Mild central vascular congestion. Suspect tiny left effusion. Electronically Signed   By: Jasmine PangKim  Fujinaga M.D.   On: 06/02/2016 23:42   Dg Chest Port 1 View  Result Date: 06/07/2016 CLINICAL DATA:  CHF short of breath EXAM: PORTABLE CHEST 1 VIEW COMPARISON:  06/04/2016 FINDINGS: Hypoventilation. Decreased lung volume with mild bibasilar atelectasis Cardiac enlargement without heart failure. CABG changes. Negative for pneumonia or effusion IMPRESSION: Hypoventilation with bibasilar atelectasis. Cardiac enlargement without heart failure. Electronically Signed   By: Marlan Palauharles  Clark M.D.   On: 06/07/2016 08:21    HONGALGI,ANAND, MD, FACP, FHM. [facility] Pager [phone]  If 7PM-7AM, please contact night-coverage [email or web address] Password [email or web address] 06/09/2016, 1:32 PM   LOS: 6 days

## 2016-06-09 NOTE — Progress Notes (Signed)
Patient Name: Lisa [name] Date of Encounter: 06/09/2016  Primary Cardiologist[facility]  Hospital Problem List     Principal Problem:   Acute on chronic respiratory failure with hypoxia [facility](HCC) Active Problems:   GERD (gastroesophageal reflux disease)   Hypertension   Morbid obesity (HCC)   Chronic diastolic CHF (congestive heart failure) (HCC)   Persistent atrial fibrillation (HCC)   Sepsis (HCC)   Acute on chronic diastolic CHF (congestive heart failure) (HCC)   Influenza-like illness   Rash and nonspecific skin eruption   Pressure injury of skin   Chronic atrial fibrillation (HCC)   Chronic anticoagulation   Anemia   Acute on chronic renal insufficiency     Subjective   Says breathing has improved, apparently urinated in the bed so output was not recorded.  Inpatient Medications    Scheduled Meds: . apixaban  5 mg Oral BID  . atorvastatin  10 mg Oral q1800  . buPROPion  150 mg Oral Daily  . furosemide  40 mg Oral Daily  . gabapentin  600 mg Oral BID  . metoprolol succinate  25 mg Oral Daily  . pantoprazole  40 mg Oral Daily  . sodium chloride flush  3 mL Intravenous Q12H  . traMADol  50 mg Oral Q12H   Continuous Infusions: . nitroGLYCERIN 20 mcg/min (06/08/16 1659)   PRN Meds: acetaminophen **OR** acetaminophen, fluticasone, ipratropium-albuterol, ondansetron **OR** ondansetron (ZOFRAN) IV, sodium chloride flush   Vital Signs    Vitals:   06/08/16 1204 06/08/16 2049 06/09/16 0124 06/09/16 0500  BP: (!) 128/53 (!) 154/70 (!) 162/57 105/60  Pulse: 75 73 79 78  Resp: 18 18  18   Temp: 98.1 F (36.7 C) 99.1 F (37.3 C) 98.6 F (37 C) 98.3 F (36.8 C)  TempSrc: Oral Oral Oral Oral  SpO2: 93% 100% 99% 98%  Weight:    252 lb (114.3 kg)  Height:        Intake/Output Summary (Last 24 hours) at 06/09/16 1121 Last data filed at 06/09/16 0200  Gross per 24 hour  Intake           877.65 ml  Output                0 ml  Net           877.65 ml   Filed  Weights   06/07/16 0900 06/08/16 0444 06/09/16 0500  Weight: 251 lb 1.6 oz (113.9 kg) 250 lb (113.4 kg) 252 lb (114.3 kg)    Physical Exam  GEN: Well nourished, well developed, in no acute distress.  HEENT: Grossly normal.  Neck: Supple, no JVD, or masses. Cardiac: Irregular rhythm, normal rate, no murmurs, rubs, or gallops. 1+ pitting pretibial edema.   Respiratory:  Diminished bilaterally. GI: Soft, obese MS: no deformity or atrophy. Neuro:  Strength and sensation are intact. Psych: AAOx3.  Normal affect.  Labs    CBC  Recent Labs  06/08/16 0409 06/09/16 0443  WBC 9.0 9.5  HGB 7.4* 7.3*  HCT 22.4* 22.5*  MCV 93.7 94.5  PLT 197 226   Basic Metabolic Panel  Recent Labs  06/08/16 0409 06/09/16 0443  NA 132* 131*  K 3.7 3.6  CL 96* 94*  CO2 27 27  GLUCOSE 92 139*  BUN 33* 35*  CREATININE 1.70* 1.71*  CALCIUM 8.7* 8.5*   Liver Function Tests No results for input(s): AST, ALT, ALKPHOS, BILITOT, PROT, ALBUMIN in the last 72 hours. No results for input(s): LIPASE, AMYLASE  in the last 72 hours. Cardiac Enzymes No results for input(s): CKTOTAL, CKMB, CKMBINDEX, TROPONINI in the last 72 hours. BNP Invalid input(s): POCBNP D-Dimer No results for input(s): DDIMER in the last 72 hours. Hemoglobin A1C No results for input(s): HGBA1C in the last 72 hours. Fasting Lipid Panel No results for input(s): CHOL, HDL, LDLCALC, TRIG, CHOLHDL, LDLDIRECT in the last 72 hours. Thyroid Function Tests No results for input(s): TSH, T4TOTAL, T3FREE, THYROIDAB in the last 72 hours.  Invalid input(s): FREET3  Telemetry     - Personally Reviewed  ECG     - Personally Reviewed  Radiology    No results found.  Cardiac Studies     Patient Profile     75 y.o.morbidly obese AA female, SNF resident,with a history of CAD-s/p CABG, DM, CRI-3, HTN, OSA, CAF,-on Eliquis, dyslipidemia,andchronic diastolic CHF. She was just admitted 05/03/16-05/09/16 for chest pain and CHF. She  is a poor historian. [facility] with suspected acute on chronic diastolic CHF, fever, increased SCr, and increased lethargy.   Assessment & Plan    1. Acute on chronic diastolic heart failure: As stated above, accurate output not recorded as patient urinated in the bed. She does report increased frequency since diuretic adjustments yesterday.  On nitro drip 20 mcg/min to optimize BP control and to treat pulmonary edema.  As BP is low normal, will reduce to 10 mcg/min. Will have to accept higher creatinine. On Lasix 40 mg daily. Will again give metolazone 2.5 mg and 40 mg IV Lasix today.  Anemia and morbid obesity/hypoventilation also playing a role in her acute on chronic respiratory failure.  May need assistance from advanced HF service.  2. Atrial fibrillation: Anticoagulated with apixaban. Rate controlled with metoprolol.  3. HTN: On nitro drip, SBP now normal. Will monitor with diuretic adjustments as noted above.  Signed, Prentice DockerSuresh Koneswaran, MD  06/09/2016, 11:21 AM

## 2016-06-09 NOTE — Progress Notes (Signed)
Blood transfusion is in progress now,started from 1623 @120 , no any side effects so far, Vitals stable, will continue to monitor the patient

## 2016-06-10 LAB — CBC
HEMATOCRIT: 26.7 % — AB (ref 36.0–46.0)
Hemoglobin: 8.9 g/dL — ABNORMAL LOW (ref 12.0–15.0)
MCH: 30 pg (ref 26.0–34.0)
MCHC: 33.3 g/dL (ref 30.0–36.0)
MCV: 89.9 fL (ref 78.0–100.0)
Platelets: 235 10*3/uL (ref 150–400)
RBC: 2.97 MIL/uL — ABNORMAL LOW (ref 3.87–5.11)
RDW: 15.1 % (ref 11.5–15.5)
WBC: 9.7 10*3/uL (ref 4.0–10.5)

## 2016-06-10 LAB — BASIC METABOLIC PANEL
ANION GAP: 10 (ref 5–15)
BUN: 34 mg/dL — ABNORMAL HIGH (ref 6–20)
CALCIUM: 8.9 mg/dL (ref 8.9–10.3)
CO2: 27 mmol/L (ref 22–32)
Chloride: 95 mmol/L — ABNORMAL LOW (ref 101–111)
Creatinine, Ser: 1.53 mg/dL — ABNORMAL HIGH (ref 0.44–1.00)
GFR calc Af Amer: 37 mL/min — ABNORMAL LOW (ref >60)
GFR calc non Af Amer: 32 mL/min — ABNORMAL LOW (ref >60)
GLUCOSE: 100 mg/dL — AB (ref 65–99)
Potassium: 3.5 mmol/L (ref 3.5–5.1)
Sodium: 132 mmol/L — ABNORMAL LOW (ref 135–145)

## 2016-06-10 LAB — TYPE AND SCREEN
BLOOD PRODUCT EXPIRATION DATE: 201712182359
ISSUE DATE / TIME: 201712031611
UNIT TYPE AND RH: 6200

## 2016-06-10 MED ORDER — MAGNESIUM HYDROXIDE 400 MG/5ML PO SUSP
15.0000 mL | Freq: Every day | ORAL | Status: AC
  Administered 2016-06-10 – 2016-06-11 (×2): 15 mL via ORAL
  Filled 2016-06-10 (×2): qty 30

## 2016-06-10 MED ORDER — METOLAZONE 2.5 MG PO TABS
2.5000 mg | ORAL_TABLET | Freq: Every day | ORAL | Status: DC
  Administered 2016-06-10 – 2016-06-13 (×4): 2.5 mg via ORAL
  Filled 2016-06-10 (×4): qty 1

## 2016-06-10 MED ORDER — FUROSEMIDE 10 MG/ML IJ SOLN
40.0000 mg | Freq: Two times a day (BID) | INTRAMUSCULAR | Status: DC
  Administered 2016-06-10 – 2016-06-12 (×4): 40 mg via INTRAVENOUS
  Filled 2016-06-10 (×4): qty 4

## 2016-06-10 NOTE — Clinical Social Work Note (Signed)
SNF notified that patient in need of air mattress when she discharges there.  Charlynn CourtSarah Ellianah Cordy, CSW [phone]

## 2016-06-10 NOTE — Progress Notes (Signed)
Thank you for consulting the Pall[facility] Medicine Team [facility] to meet your patient's and family's needs.   The reason that you asked us to see your patient is  Establishing GOC  We have scheduled your patient for a meeting: Wednesday 06-12-16 at 1600 with Yong ChannelAlicia Parker NP with PMT.  This is the earliest family are able to meet.  Other family members that need to be present: Two daughters and possibly other family members.  Lorinda CreedMary Iridian Reader NP  Palliative Medicine Team  Team Phone # [phone]    No charge

## 2016-06-10 NOTE — Progress Notes (Signed)
Patient Name: Lisa [name] Date of Encounter: 06/10/2016  [facility]  Hospital Problem List     Principal Problem:   Acute on chronic respiratory failure with hypoxia [facility](HCC) Active Problems:   GERD (gastroesophageal reflux disease)   Hypertension   Morbid obesity (HCC)   Chronic diastolic CHF (congestive heart failure) (HCC)   Persistent atrial fibrillation (HCC)   Sepsis (HCC)   Acute on chronic diastolic CHF (congestive heart failure) (HCC)   Influenza-like illness   Rash and nonspecific skin eruption   Pressure injury of skin   Chronic atrial fibrillation (HCC)   Chronic anticoagulation   Anemia   Acute on chronic renal insufficiency   Subjective   She feels SOB and complains of pain in the LLQ. She hasn't had a bowel movement in several days.  Inpatient Medications    Scheduled Meds: . apixaban  5 mg Oral BID  . atorvastatin  10 mg Oral q1800  . buPROPion  150 mg Oral Daily  . furosemide  40 mg Oral Daily  . gabapentin  600 mg Oral BID  . metoprolol succinate  25 mg Oral Daily  . pantoprazole  40 mg Oral Daily  . polyethylene glycol  17 g Oral Daily  . senna  2 tablet Oral Daily  . sodium chloride flush  3 mL Intravenous Q12H  . traMADol  50 mg Oral Q12H   Continuous Infusions: . nitroGLYCERIN 10 mcg/min (06/09/16 1157)   PRN Meds: acetaminophen **OR** acetaminophen, fluticasone, ipratropium-albuterol, ondansetron **OR** ondansetron (ZOFRAN) IV, sodium chloride flush   Vital Signs    Vitals:   06/09/16 1638 06/09/16 1958 06/10/16 0005 06/10/16 0401  BP: 140/70 (!) 166/68 (!) 150/76 140/65  Pulse: 76 77 72 79  Resp: 18 18 20 18   Temp: 98.6 F (37 C) 98.1 F (36.7 C) 98.3 F (36.8 C) 98 F (36.7 C)  TempSrc: Oral Oral Oral Oral  SpO2: 98% 97% 98% 98%  Weight:    249 lb 14.4 oz (113.4 kg)  Height:        Intake/Output Summary (Last 24 hours) at 06/10/16 0930 Last data filed at 06/10/16 0925  Gross per 24 hour  Intake            651.85 ml  Output                0 ml  Net           651.85 ml   Filed Weights   06/08/16 0444 06/09/16 0500 06/10/16 0401  Weight: 250 lb (113.4 kg) 252 lb (114.3 kg) 249 lb 14.4 oz (113.4 kg)    Physical Exam  GEN: Obese AA female, in no acute distress. Wearing [place].  HEENT: Grossly normal.  Neck: Supple, no JVD, or masses. Cardiac: RRR, normal rate, no murmurs, rubs, or gallops. 1+ pitting pretibial edema.   Respiratory:  Diminished bilaterally. GI: Soft, obese MS: no deformity or atrophy. Neuro:  Strength and sensation are intact. Psych: AAOx3.  Normal affect.  Labs    CBC  Recent Labs  06/09/16 0443 06/10/16 0330  WBC 9.5 9.7  HGB 7.3* 8.9*  HCT 22.5* 26.7*  MCV 94.5 89.9  PLT 226 235   Basic Metabolic Panel  Recent Labs  06/09/16 0443 06/10/16 0330  NA 131* 132*  K 3.6 3.5  CL 94* 95*  CO2 27 27  GLUCOSE 139* 100*  BUN 35* 34*  CREATININE 1.71* 1.53*  CALCIUM 8.5* 8.9   Telemetry  SR with PVCs, and PACs - Personally Reviewed  ECG    N/A - Personally Reviewed  Radiology    No results found.  Cardiac Studies   TTE: 06/05/16 - Left ventricle: The cavity size was normal. Wall thickness was   increased in a pattern of moderate LVH. Systolic function was   normal. The estimated ejection fraction was in the range of 50%   to 55%. Wall motion was normal; there were no regional wall   motion abnormalities. Doppler parameters are consistent with   abnormal left ventricular relaxation (grade 1 diastolic   dysfunction). - Mitral valve: Mildly calcified annulus. - Left atrium: The atrium was moderately dilated. - Tricuspid valve: There was mild-moderate regurgitation. - Pulmonary arteries: Systolic pressure was moderately increased.   PA peak pressure: 53 mm Hg (S).    Patient Profile     75 y.o.morbidly obese AA female, SNF resident,with a history of CAD-s/p CABG, DM, CRI-3, HTN, OSA, CAF,-on Eliquis, dyslipidemia,andchronic  diastolic CHF. She was just admitted 05/03/16-05/09/16 for chest pain and CHF. She is a poor historian. [facility] with suspected acute on chronic diastolic CHF, fever, increased SCr, and increased lethargy.   Assessment & Plan    1. Acute on chronic diastolic heart failure: accurate output not recorded as patient has been incontinent. She does report increased frequency since diuretic adjustments. Weight is down 252>>249lbs, baseline 232.  -- nitro drip 10 mcg/min to optimize BP control and to treat pulmonary edema, transition to Imdur? As blood pressure has improved. I will switch to imdur 30 mg po daily and hydralazine 25 mg po TID.  -- On Lasix 40 mg daily. Cr improved today 1.71>>1.53, still fluid overloaded, switch to lasix iv 40 mg BID and metolazone 2.5 mg po daily. Anemia and morbid obesity/hypoventilation also playing a role in her acute on chronic respiratory failure.   2. Atrial fibrillation: Appears to be in SR today with PVCs, and PACs noted. -- Anticoagulated with apixaban. Rate controlled with metoprolol.  3. HTN: On nitro drip, switch to imdur/hydralazine   4. Anemia: Received transfusion yesterday. Hgb 8.9 today.  Signed, Tobias AlexanderKatarina Robertine Kipper, MD 06/10/2016  06/10/2016, 9:30 AM

## 2016-06-10 NOTE — Progress Notes (Addendum)
PROGRESS NOTE  Lisa [name] JXB:[id number] DOB: January 29, 1941 DOA: 06/02/2016 PCP: Laurena Slimmer, MD   Brief History:  75 year old female with a history of hypertension, hyperlipidemia, diastolic CHF, atrial fibrillation, coronary artery disease status post CABG, diabetes mellitus type 2, morbid obesity, chronic respiratory failure on 2 L presented from home secondary to somnolence, lethargy, and increasing shortness of breath. The patient was recently discharged from a skilled nursing facility 3 days prior to this admission and has been living at home with her family. Upon arrival, EMS noted that the patient's O2 saturations were 84% on room air. The patient is a poor historian, but family states that that she has had myalgias and arthralgias for the past 2-3 days prior to this admission. Upon presentation, the patient was noted to have temperature 100.28F with heart rate 105 requiring up to 4 L nasal cannula to maintain adequate oxygen saturation. There was concern for pulmonary infection; therefore, the patient was started on intravenous Zosyn and vancomycin. As the patient improved clinically and remained afebrile, antibiotics were discontinued on 06/04/2016. Diuresed for decompensated CHF. Kidney functions worsened. Cardiology was consulted and patient was placed on NTG drip with IV diuresis. Renal function slowly improving. After discussion with family, requested palliative care consultation for goals of care on 12/3 (Can be done at SNF if this is not achieved by the time she is stable for DC).  Assessment/Plan: Acute on chronic respiratory failure with hypoxia - Secondary to decompensated CHF. At baseline, patient is on 2 L nasal cannula. Wean oxygen as tolerated back to baseline.  -Flu panel PCR and RSV panel negative.  Acute on chronic diastolic CHF -During her office visit 05/03/2016, the patient's weight was 232 pounds. Admission weight 254 pounds. Suspect inaccurate daily  weights and intake output in the hospital.  - She was initially treated with IV Lasix which was transitioned to oral after clinical improvement. Creatinine gradually increased from 1.47 on 11/27 to 1.83 on 11/30. She remained volume overloaded. We may have to accept higher creatinine related to diuresis. Cardiology was consulted >started NTG infusion to help with blood pressure and treat pulmonary edema and recommended considering PRBC transfusion. - As per cardiology follow-up 12/4, changing Lasix to 40 mg IV twice a day due to continued volume overload and considering changing IV NTG to Imdur. Continue to monitor.  Fever -sepsis ruled out. Flu panel PCR, RSV panel, UA and chest x-ray negative for features of infection.  - No further fevers.   ParoxysmalAtrial fibrillation -Continue apixaban and metoprolol succinate. CHADSVASc = 6 (CHF, HTN, Age, CAD, Female) -Rate controlled  Acute Encephalopathy - multifactorial including decompensated CHF, hypoxia and acute on chronic renal failure - B12--406 -TSH--7.344--> repeated TSH: 4.52 and free T4: 1.44. Clinically euthyroid. Recommend repeating full TFTs in 4-6 weeks. Ammonia--22 - Acute mental status changes resolved.   Acute on chronic renal failure--CKD stage III -Baseline creatinine 1.1-1.4 - Creatinine peaked to 1.8. Creatinine has improved to 1.5 on 12/4 . Continue to monitor  Hypertension -Mildly uncontrolled. Continue metoprolol succinate. Amlodipine was discontinued earlier due to soft blood pressures and may need to be resumed. Nitrates as above. Monitor  Foot rash -This appears to be ecchymosis on examination--nonblanching -Suspect the patient had recent trauma in the setting of apixiban use -No signs of infection  OSA  -- the patient is tolerating CPAP therapy well without any problems at home.   Hyperlipidemia -continue statin  Hypomagnesemia - Replaced.  Anemia -  No bleeding reported. Hemoglobin gradually  dropping 9.5 > 7.9 >7.9 >7.4 >7.3. No overt bleeding reported. S/p 1 unit of PRBC on 12/3. Hemoglobin improved to 8.9. FOBT has been requested couple of days back but not sent, discussed with nursing. Worsening anemia likely secondary to acute illness, chronic disease and phlebotomies in the hospital.   Coagulase negative blood culture in one of 2 bottles - Likely contaminant. No intervention.  Hypokalemia - Replaced.  Right upper extremity swelling - Patient states that she has had this for approximately 2 weeks. Denies pain. May be asymmetrical edema . Venous Doppler negative for DVT. Elevated extremity. Good peripheral pulsations. Improving.  Adult failure to thrive - Multifactorial secondary to advanced age, possible dementia and multiple severe comorbidities and acute illness. Discussed in detail [place] 12/3 and she was agreeable to palliative care consultation-requested 12/3 and pending.   Disposition Plan:   DC to SNF when medically stable. Family Communication:   discussed in detail with patient's daughter on 12/3. None at bedside today.   Consultants:   cardiology  Code Status:  FULL  DVT Prophylaxis: apixaban   Procedures: As Listed in Progress Note Above  Antibiotics: None    Subjective: Since morning. Denied complaints. Denied chest pain, dyspnea or pain elsewhere. As per RN, no acute issues. Urinary incontinence. States that she had a BM this morning.   Objective: Vitals:   06/09/16 1958 06/10/16 0005 06/10/16 0401 06/10/16 1139  BP: (!) 166/68 (!) 150/76 140/65 (!) 160/59  Pulse: 77 72 79 79  Resp: 18 20 18 18   Temp: 98.1 F (36.7 C) 98.3 F (36.8 C) 98 F (36.7 C) 98.2 F (36.8 C)  TempSrc: Oral Oral Oral Oral  SpO2: 97% 98% 98% 100%  Weight:   113.4 kg (249 lb 14.4 oz)   Height:        Intake/Output Summary (Last 24 hours) at 06/10/16 1302 Last data filed at 06/10/16 [id number]  Gross per 24 hour  Intake            651.85 ml  Output                0 ml  Net           651.85 ml   Weight change: -0.953 kg (-2 lb 1.6 oz) Exam:   General:  Pleasant elderly female, morbidly obese, lying comfortably supine in bed this morning. Chronically ill-looking.   Cardiovascular: RRR, S1/S2, no rubs, no gallops. Trace leg edema. Telemetry: PAF/SR with BBB morphology.  Respiratory: Diminished breath sounds in the bases with occasional bibasal crackles. Poor inspiratory effort. No wheezing. Good air movement. Rest of lung fields clear to auscultation. No increased work of breathing.   Abdomen: Soft/+BS, non tender, non distended, no guarding  Extremities: Diffuse right upper extremity swelling has significantly improved over the last couple of days.  CNS: Alert and oriented 2. No focal neurological deficits.   Data Reviewed: I have personally reviewed following labs and imaging studies Basic Metabolic Panel:  Recent Labs Lab 06/04/16 0326 06/05/16 0538 06/06/16 0442 06/07/16 0412 06/08/16 0409 06/09/16 0443 06/10/16 0330  NA 136 133* 134* 131* 132* 131* 132*  K 3.8 3.5 3.3* 3.6 3.7 3.6 3.5  CL 100* 96* 98* 94* 96* 94* 95*  CO2 26 26 27 25 27 27 27   GLUCOSE 90 84 92 119* 92 139* 100*  BUN 23* 29* 32* 34* 33* 35* 34*  CREATININE 1.60* 1.77* 1.83* 1.87* 1.70*  1.71* 1.53*  CALCIUM 8.7* 8.3* 8.5* 8.6* 8.7* 8.5* 8.9  MG 1.4* 1.4* 2.0  --   --   --   --    Liver Function Tests: No results for input(s): AST, ALT, ALKPHOS, BILITOT, PROT, ALBUMIN in the last 168 hours. No results for input(s): LIPASE, AMYLASE in the last 168 hours.  Recent Labs Lab 06/04/16 0326  AMMONIA 22   Coagulation Profile: No results for input(s): INR, PROTIME in the last 168 hours. CBC:  Recent Labs Lab 06/06/16 0442 06/07/16 0412 06/08/16 0409 06/09/16 0443 06/10/16 0330  WBC 10.6* 10.6* 9.0 9.5 9.7  HGB 7.9* 7.9* 7.4* 7.3* 8.9*  HCT 24.4* 24.0* 22.4* 22.5* 26.7*  MCV 94.9 94.5 93.7 94.5 89.9  PLT 177 186  197 226 235   Cardiac Enzymes: No results for input(s): CKTOTAL, CKMB, CKMBINDEX, TROPONINI in the last 168 hours. BNP: Invalid input(s): POCBNP CBG: No results for input(s): GLUCAP in the last 168 hours. HbA1C: No results for input(s): HGBA1C in the last 72 hours. Urine analysis:    Component Value Date/Time   COLORURINE YELLOW 06/02/2016 2235   APPEARANCEUR CLEAR 06/02/2016 2235   LABSPEC 1.012 06/02/2016 2235   PHURINE 7.5 06/02/2016 2235   GLUCOSEU NEGATIVE 06/02/2016 2235   HGBUR SMALL (A) 06/02/2016 2235   BILIRUBINUR NEGATIVE 06/02/2016 2235   KETONESUR NEGATIVE 06/02/2016 2235   PROTEINUR 100 (A) 06/02/2016 2235   UROBILINOGEN 1.0 08/20/2014 1130   NITRITE NEGATIVE 06/02/2016 2235   LEUKOCYTESUR NEGATIVE 06/02/2016 2235   Sepsis Labs: @LABRCNTIP (procalcitonin:4,lacticidven:4) ) Recent Results (from the past 240 hour(s))  Blood culture (routine x 2)     Status: Abnormal   Collection Time: 06/02/16  2:13 PM  Result Value Ref Range Status   Specimen Description BLOOD RIGHT ANTECUBITAL  Final   Special Requests BOTTLES DRAWN AEROBIC AND ANAEROBIC 5CC  Final   Culture  Setup Time   Final    GRAM POSITIVE COCCI IN CLUSTERS IN BOTH AEROBIC AND ANAEROBIC BOTTLES CRITICAL RESULT CALLED TO, READ BACK BY AND VERIFIED WITH: A. Reece Leader.D. 11:15 06/03/16  ([email or web address])    Culture (A)  Final    STAPHYLOCOCCUS SPECIES (COAGULASE NEGATIVE) THE SIGNIFICANCE OF ISOLATING THIS ORGANISM FROM A SINGLE SET OF BLOOD CULTURES WHEN MULTIPLE SETS ARE DRAWN IS UNCERTAIN. PLEASE NOTIFY THE MICROBIOLOGY DEPARTMENT WITHIN ONE WEEK IF SPECIATION AND SENSITIVITIES ARE REQUIRED.    Report Status 06/05/2016 FINAL  Final  Blood Culture ID Panel (Reflexed)     Status: Abnormal   Collection Time: 06/02/16  2:13 PM  Result Value Ref Range Status   Enterococcus species NOT DETECTED NOT DETECTED Final   Listeria monocytogenes NOT DETECTED NOT DETECTED Final   Staphylococcus species DETECTED (A)  NOT DETECTED Final    Comment: CRITICAL RESULT CALLED TO, READ BACK BY AND VERIFIED WITH: A. Reece Leader.D. 11:15 06/03/16 ([email or web address])    Staphylococcus aureus NOT DETECTED NOT DETECTED Final   Methicillin resistance DETECTED (A) NOT DETECTED Final    Comment: CRITICAL RESULT CALLED TO, READ BACK BY AND VERIFIED WITH: A. Reece Leader.D. 11:15 06/03/16 ([email or web address])    Streptococcus species NOT DETECTED NOT DETECTED Final   Streptococcus agalactiae NOT DETECTED NOT DETECTED Final   Streptococcus pneumoniae NOT DETECTED NOT DETECTED Final   Streptococcus pyogenes NOT DETECTED NOT DETECTED Final   Acinetobacter baumannii NOT DETECTED NOT DETECTED Final   Enterobacteriaceae species NOT DETECTED NOT DETECTED Final   Enterobacter cloacae complex NOT DETECTED NOT DETECTED Final   Escherichia coli NOT DETECTED  NOT DETECTED Final   Klebsiella oxytoca NOT DETECTED NOT DETECTED Final   Klebsiella pneumoniae NOT DETECTED NOT DETECTED Final   Proteus species NOT DETECTED NOT DETECTED Final   Serratia marcescens NOT DETECTED NOT DETECTED Final   Haemophilus influenzae NOT DETECTED NOT DETECTED Final   Neisseria meningitidis NOT DETECTED NOT DETECTED Final   Pseudomonas aeruginosa NOT DETECTED NOT DETECTED Final   Candida albicans NOT DETECTED NOT DETECTED Final   Candida glabrata NOT DETECTED NOT DETECTED Final   Candida krusei NOT DETECTED NOT DETECTED Final   Candida parapsilosis NOT DETECTED NOT DETECTED Final   Candida tropicalis NOT DETECTED NOT DETECTED Final  Blood culture (routine x 2)     Status: None   Collection Time: 06/02/16  7:10 PM  Result Value Ref Range Status   Specimen Description BLOOD LEFT ANTECUBITAL  Final   Special Requests BOTTLES DRAWN AEROBIC AND ANAEROBIC 5CC  Final   Culture NO GROWTH 5 DAYS  Final   Report Status 06/07/2016 FINAL  Final  Urine culture     Status: Abnormal   Collection Time: 06/02/16 10:35 PM  Result Value Ref Range Status   Specimen Description  URINE, CATHETERIZED  Final   Special Requests NONE  Final   Culture MULTIPLE SPECIES PRESENT, SUGGEST RECOLLECTION (A)  Final   Report Status 06/05/2016 FINAL  Final  MRSA PCR Screening     Status: None   Collection Time: 06/03/16  9:28 AM  Result Value Ref Range Status   MRSA by PCR NEGATIVE NEGATIVE Final    Comment:        The GeneXpert MRSA Assay (FDA approved for NASAL specimens only), is one component of a comprehensive MRSA colonization surveillance program. It is not intended to diagnose MRSA infection nor to guide or monitor treatment for MRSA infections.   Respiratory Panel by PCR     Status: None   Collection Time: 06/03/16  9:43 AM  Result Value Ref Range Status   Adenovirus NOT DETECTED NOT DETECTED Final   Coronavirus 229E NOT DETECTED NOT DETECTED Final   Coronavirus HKU1 NOT DETECTED NOT DETECTED Final   Coronavirus NL63 NOT DETECTED NOT DETECTED Final   Coronavirus OC43 NOT DETECTED NOT DETECTED Final   Metapneumovirus NOT DETECTED NOT DETECTED Final   Rhinovirus / Enterovirus NOT DETECTED NOT DETECTED Final   Influenza A NOT DETECTED NOT DETECTED Final   Influenza B NOT DETECTED NOT DETECTED Final   Parainfluenza Virus 1 NOT DETECTED NOT DETECTED Final   Parainfluenza Virus 2 NOT DETECTED NOT DETECTED Final   Parainfluenza Virus 3 NOT DETECTED NOT DETECTED Final   Parainfluenza Virus 4 NOT DETECTED NOT DETECTED Final   Respiratory Syncytial Virus NOT DETECTED NOT DETECTED Final   Bordetella pertussis NOT DETECTED NOT DETECTED Final   Chlamydophila pneumoniae NOT DETECTED NOT DETECTED Final   Mycoplasma pneumoniae NOT DETECTED NOT DETECTED Final     Scheduled Meds: . apixaban  5 mg Oral BID  . atorvastatin  10 mg Oral q1800  . buPROPion  150 mg Oral Daily  . furosemide  40 mg Intravenous BID  . gabapentin  600 mg Oral BID  . magnesium hydroxide  15 mL Oral Daily  . metolazone  2.5 mg Oral Daily  . metoprolol succinate  25 mg Oral Daily  .  pantoprazole  40 mg Oral Daily  . polyethylene glycol  17 g Oral Daily  . senna  2 tablet Oral Daily  . sodium chloride flush  3 mL  Intravenous Q12H  . traMADol  50 mg Oral Q12H   Continuous Infusions: . nitroGLYCERIN 10 mcg/min (06/09/16 1157)    Procedures/Studies: Dg Chest 2 View  Result Date: 06/02/2016 CLINICAL DATA:  Trouble breathing today, weakness EXAM: CHEST  2 VIEW COMPARISON:  05/05/2016 FINDINGS: Median sternotomy wires are again evident. Discoid atelectasis in the right mid lung zone. Linear atelectasis in the left upper lobe. Low lung volumes. Suspect tiny left pleural effusion. Mild to moderate cardiomegaly with mild central congestion. No pneumothorax. Partially visualized spinal hardware. IMPRESSION: 1. Low lung volumes. 2. Linear atelectasis in the right lower lung zone. Linear atelectasis in the left upper lobe. 3. Stable cardiomegaly. Mild central vascular congestion. Suspect tiny left effusion. Electronically Signed   By: Jasmine PangKim  Fujinaga M.D.   On: 06/02/2016 23:42   Dg Chest Port 1 View  Result Date: 06/07/2016 CLINICAL DATA:  CHF short of breath EXAM: PORTABLE CHEST 1 VIEW COMPARISON:  06/04/2016 FINDINGS: Hypoventilation. Decreased lung volume with mild bibasilar atelectasis Cardiac enlargement without heart failure. CABG changes. Negative for pneumonia or effusion IMPRESSION: Hypoventilation with bibasilar atelectasis. Cardiac enlargement without heart failure. Electronically Signed   By: Marlan Palauharles  Clark M.D.   On: 06/07/2016 08:21    Ifrah Vest, MD, FACP, FHM. [facility] Pager [phone]  If 7PM-7AM, please contact night-coverage [email or web address] Password [email or web address] 06/10/2016, 1:02 PM   LOS: 7 days

## 2016-06-10 NOTE — Progress Notes (Signed)
RT NOTE: ? ?Pt refuses CPAP.  ?

## 2016-06-11 DIAGNOSIS — I480 Paroxysmal atrial fibrillation: Secondary | ICD-10-CM

## 2016-06-11 DIAGNOSIS — D508 Other iron deficiency anemias: Secondary | ICD-10-CM

## 2016-06-11 LAB — BASIC METABOLIC PANEL
Anion gap: 7 (ref 5–15)
BUN: 29 mg/dL — AB (ref 6–20)
CHLORIDE: 91 mmol/L — AB (ref 101–111)
CO2: 35 mmol/L — ABNORMAL HIGH (ref 22–32)
CREATININE: 1.31 mg/dL — AB (ref 0.44–1.00)
Calcium: 9.3 mg/dL (ref 8.9–10.3)
GFR, EST AFRICAN AMERICAN: 44 mL/min — AB (ref >60)
GFR, EST NON AFRICAN AMERICAN: 38 mL/min — AB (ref >60)
Glucose, Bld: 100 mg/dL — ABNORMAL HIGH (ref 65–99)
Potassium: 3.5 mmol/L (ref 3.5–5.1)
SODIUM: 133 mmol/L — AB (ref 135–145)

## 2016-06-11 LAB — CBC
HCT: 31.7 % — ABNORMAL LOW (ref 36.0–46.0)
HEMOGLOBIN: 10.4 g/dL — AB (ref 12.0–15.0)
MCH: 29.5 pg (ref 26.0–34.0)
MCHC: 32.8 g/dL (ref 30.0–36.0)
MCV: 90.1 fL (ref 78.0–100.0)
PLATELETS: 232 10*3/uL (ref 150–400)
RBC: 3.52 MIL/uL — AB (ref 3.87–5.11)
RDW: 15 % (ref 11.5–15.5)
WBC: 8.3 10*3/uL (ref 4.0–10.5)

## 2016-06-11 MED ORDER — ISOSORBIDE MONONITRATE ER 30 MG PO TB24
30.0000 mg | ORAL_TABLET | Freq: Every day | ORAL | Status: DC
  Administered 2016-06-11 – 2016-06-13 (×3): 30 mg via ORAL
  Filled 2016-06-11 (×3): qty 1

## 2016-06-11 MED ORDER — HYDRALAZINE HCL 25 MG PO TABS
25.0000 mg | ORAL_TABLET | Freq: Three times a day (TID) | ORAL | Status: DC
  Administered 2016-06-11 – 2016-06-13 (×6): 25 mg via ORAL
  Filled 2016-06-11 (×6): qty 1

## 2016-06-11 MED ORDER — GLUCERNA SHAKE PO LIQD
237.0000 mL | Freq: Three times a day (TID) | ORAL | Status: DC
  Administered 2016-06-11 – 2016-06-13 (×5): 237 mL via ORAL

## 2016-06-11 MED ORDER — PRO-STAT SUGAR FREE PO LIQD
30.0000 mL | Freq: Two times a day (BID) | ORAL | Status: DC
  Administered 2016-06-11 – 2016-06-13 (×4): 30 mL via ORAL
  Filled 2016-06-11 (×5): qty 30

## 2016-06-11 MED ORDER — ADULT MULTIVITAMIN W/MINERALS CH
1.0000 | ORAL_TABLET | Freq: Every day | ORAL | Status: DC
  Administered 2016-06-11 – 2016-06-13 (×3): 1 via ORAL
  Filled 2016-06-11 (×3): qty 1

## 2016-06-11 NOTE — Progress Notes (Signed)
Patient Name: Lisa SchneidersGrace A Wyre Date of Encounter: 06/11/2016  Primary Cardiologist: [facility]  Hospital Problem List     Principal Problem:   Acute on chronic respiratory failure with hypoxia [facility](HCC) Active Problems:   GERD (gastroesophageal reflux disease)   Hypertension   Morbid obesity (HCC)   Chronic diastolic CHF (congestive heart failure) (HCC)   Persistent atrial fibrillation (HCC)   Sepsis (HCC)   Acute on chronic diastolic CHF (congestive heart failure) (HCC)   Influenza-like illness   Rash and nonspecific skin eruption   Pressure injury of skin   Chronic atrial fibrillation (HCC)   Chronic anticoagulation   Anemia   Acute on chronic renal insufficiency   Subjective   The patient feels better today, improved SOB.   Inpatient Medications    Scheduled Meds: . apixaban  5 mg Oral BID  . atorvastatin  10 mg Oral q1800  . buPROPion  150 mg Oral Daily  . furosemide  40 mg Intravenous BID  . gabapentin  600 mg Oral BID  . metolazone  2.5 mg Oral Daily  . metoprolol succinate  25 mg Oral Daily  . pantoprazole  40 mg Oral Daily  . polyethylene glycol  17 g Oral Daily  . senna  2 tablet Oral Daily  . sodium chloride flush  3 mL Intravenous Q12H  . traMADol  50 mg Oral Q12H   Continuous Infusions: . nitroGLYCERIN 10 mcg/min (06/11/16 0601)   PRN Meds: acetaminophen **OR** acetaminophen, fluticasone, ipratropium-albuterol, ondansetron **OR** ondansetron (ZOFRAN) IV, sodium chloride flush   Vital Signs    Vitals:   06/10/16 1139 06/10/16 2100 06/11/16 0709 06/11/16 1239  BP: (!) 160/59 (!) 143/63 (!) 148/65 (!) 152/66  Pulse: 79 80 83 82  Resp: 18 18 18 18   Temp: 98.2 F (36.8 C) 98.3 F (36.8 C) 98.5 F (36.9 C) 97.8 F (36.6 C)  TempSrc: Oral Oral Oral Oral  SpO2: 100% 100% 100% 100%  Weight:   239 lb 3.2 oz (108.5 kg)   Height:        Intake/Output Summary (Last 24 hours) at 06/11/16 1257 Last data filed at 06/11/16 [id number]  Gross per 24 hour  Intake               480 ml  Output                0 ml  Net              480 ml   Filed Weights   06/09/16 0500 06/10/16 0401 06/11/16 0709  Weight: 252 lb (114.3 kg) 249 lb 14.4 oz (113.4 kg) 239 lb 3.2 oz (108.5 kg)    Physical Exam  GEN: Obese AA female, in no acute distress. Wearing [place].  HEENT: Grossly normal.  Neck: Supple, no JVD, or masses. Cardiac: RRR, normal rate, no murmurs, rubs, or gallops. 1+ pitting pretibial edema.   Respiratory:  Diminished bilaterally. GI: Soft, obese MS: no deformity or atrophy. Neuro:  Strength and sensation are intact. Psych: AAOx3.  Normal affect.  Labs    CBC  Recent Labs  06/10/16 0330 06/11/16 0305  WBC 9.7 8.3  HGB 8.9* 10.4*  HCT 26.7* 31.7*  MCV 89.9 90.1  PLT 235 232   Basic Metabolic Panel  Recent Labs  06/10/16 0330 06/11/16 0305  NA 132* 133*  K 3.5 3.5  CL 95* 91*  CO2 27 35*  GLUCOSE 100* 100*  BUN 34* 29*  CREATININE 1.53* 1.31*  CALCIUM  8.9 9.3   Telemetry     SR with PVCs, and PACs - Personally Reviewed  ECG    N/A - Personally Reviewed  Radiology    No results found.  Cardiac Studies   TTE: 06/05/16 - Left ventricle: The cavity size was normal. Wall thickness was   increased in a pattern of moderate LVH. Systolic function was   normal. The estimated ejection fraction was in the range of 50%   to 55%. Wall motion was normal; there were no regional wall   motion abnormalities. Doppler parameters are consistent with   abnormal left ventricular relaxation (grade 1 diastolic   dysfunction). - Mitral valve: Mildly calcified annulus. - Left atrium: The atrium was moderately dilated. - Tricuspid valve: There was mild-moderate regurgitation. - Pulmonary arteries: Systolic pressure was moderately increased.   PA peak pressure: 53 mm Hg (S).    Patient Profile     75 y.o.morbidly obese AA female, SNF resident,with a history of CAD-s/p CABG, DM, CRI-3, HTN, OSA, CAF,-on Eliquis,  dyslipidemia,andchronic diastolic CHF. She was just admitted 05/03/16-05/09/16 for chest pain and CHF. She is a poor historian. [facility] with suspected acute on chronic diastolic CHF, fever, increased SCr, and increased lethargy.   Assessment & Plan    1. Acute on chronic diastolic heart failure: accurate output not recorded as patient has been incontinent. She does report increased frequency since diuretic adjustments. Weight is down 252>>239lbs, baseline 232.  --Great response to iv diuretics, I would continue at least till tomorrow.  --Crea improved 1.53-> 1.31 -- nitro drip 10 mcg/min to optimize BP control and to treat pulmonary edema, I will transition to imdur 30 mg po daily and hydralazine 25 mg po TID.  Anemia and morbid obesity/hypoventilation also playing a role in her acute on chronic respiratory failure.   2. Atrial fibrillation: Appears to be in SR today with PVCs, and PACs noted. -- Anticoagulated with apixaban. Rate controlled with metoprolol.  3. HTN: On nitro drip, switch to imdur/hydralazine   4. Anemia: Received transfusion yesterday. Hgb 8.9 ->10.4 today.  Signed, Tobias AlexanderKatarina Damonte Frieson, MD 06/11/2016  06/11/2016, 12:57 PM

## 2016-06-11 NOTE — Progress Notes (Signed)
PROGRESS NOTE  Lisa SchneidersGrace A [name] OZD:[id number] DOB: 21-Feb-1941 DOA: 06/02/2016 PCP: [name],[name], MD   Brief History:  5777 ? history of hypertension,  hyperlipidemia,  diastolic CHF,   Recent echo 05/05/16=EF 50-55% grd 1 DDD, PASP 53 mm atrial fibrillation,  coronary artery disease status post CABG x 3 2008  diabetes mellitus type 2 with neuropathy  morbid obesity,  chronic respiratory failure on 2 L  Renal cysts on US 05/2016 OSA not on cpap as has no mask  Recent admit 10/27--11/1 with atypical cp presentation  Admitted 11/27 somnolence, lethargy, and increasing shortness of breath.   Upon arrival, O2 saturations were 84% on room air.  had myalgias and arthralgias for the past 2-3 days prior to this admission.   temperature 100.72F with heart rate 105 requiring up to 4 L nasal cannula to maintain adequate oxygen saturation. Initially thought pulmonary infection; therefore, the patient was started on intravenous Zosyn and vancomycin.  As the patient improved clinically and remained afebrile, antibiotics were discontinued on 06/04/2016.  Diuresed for decompensated CHF. Kidney functions worsened.  Cardiology was consulted and patient was placed on NTG drip with IV diuresis.  Renal function slowly improving.  palliative care consultation for goals of care on 12/3    Assessment/Plan: Acute on chronic respiratory failure with hypoxia - Secondary to decompensated CHF. At baseline, patient is on 2 L nasal cannula. Wean oxygen as tolerated back to baseline.  -Flu panel PCR and RSV panel negative.  Acute on chronic diastolic CHF -During her office visit 05/03/2016, the patient's weight was 232 pounds. Admission weight 254 pounds. Suspect inaccurate daily weights and intake output in the hospital.  - She was initially treated with IV Lasix which was transitioned to oral after clinical improvement. Creatinine gradually increased from 1.47 on 11/27 to 1.83 on 11/30. She  remained volume overloaded. We may have to accept higher creatinine related to diuresis. Cardiology was consulted >started NTG infusion to help with blood pressure and treat pulmonary edema and recommended considering PRBC transfusion. - As per cardiology follow-up 12/4, changing Lasix to 40 mg IV twice a day due to continued volume overload and considering changing IV NTG to hydralazine and nitrodur Continue to monitor.  Fever -sepsis ruled out. Flu panel PCR, RSV panel, UA and chest x-ray negative for features of infection.  - No further fevers.   ParoxysmalAtrial fibrillation -Continue apixaban and metoprolol succinate. CHADSVASc = 6 (CHF, HTN, Age, CAD, Female) -Rate controlled  Acute Encephalopathy - multifactorial including decompensated CHF, hypoxia and acute on chronic renal failure - B12--406 -TSH--7.344--> repeated TSH: 4.52 and free T4: 1.44. Clinically euthyroid. Recommend repeating full TFTs in 4-6 weeks. Ammonia--22 - Acute mental status changes resolved.   Acute on chronic renal failure--CKD stage III -Baseline creatinine 1.1-1.4 - Creatinine peaked to 1.8. Creatinine has improved to 1.5 on 12/4 . Continue to monitor  Hypertension -Mildly uncontrolled. Continue metoprolol succinate. Amlodipine was discontinued earlier due to soft blood pressures and may need to be resumed. Nitrates as above. Monitor  Foot rash -This appears to be ecchymosis on examination--nonblanching -Suspect the patient had recent trauma in the setting of apixiban use -No signs of infection  OSA  -- the patient is tolerating CPAP therapy well without any problems at home.   Hyperlipidemia -continue statin  Hypomagnesemia - Replaced.  Anemia - No bleeding reported. Hemoglobin gradually dropping 9.5 > 7.9 >7.9 >7.4 >7.3. No overt bleeding reported. S/p 1 unit of  PRBC on 12/3. Hemoglobin improved to 8.9. FOBT has been requested couple of days back but not sent, discussed with nursing.  Worsening anemia likely secondary to acute illness, chronic disease and phlebotomies in the hospital.   Coagulase negative blood culture in one of 2 bottles - Likely contaminant. No intervention.  Hypokalemia - Replaced.  Right upper extremity swelling - Patient states that she has had this for approximately 2 weeks. Denies pain. May be asymmetrical edema . Venous Doppler negative for DVT. Elevated extremity. Good peripheral pulsations. Improving.  Adult failure to thrive - Multifactorial secondary to advanced age, possible dementia and multiple severe comorbidities and acute illness. Discussed in detail with patient's daughter from [place] on 12/3 and she was agreeable to palliative care consultation-requested 12/3 and pending.   Disposition Plan:   DC to SNF when medically stable. Family Communication:   discussed in detail with patient's daughter on 12/3. None at bedside today.   Consultants:   cardiology  Code Status:  FULL  DVT Prophylaxis: apixaban   Procedures: As Listed in Progress Note Above  Antibiotics: None    Subjective:  breathing better No cp Passing urine ++ Not hungry   Objective: Vitals:   06/10/16 1139 06/10/16 2100 06/11/16 0709 06/11/16 1239  BP: (!) 160/59 (!) 143/63 (!) 148/65 (!) 152/66  Pulse: 79 80 83 82  Resp: 18 18 18 18   Temp: 98.2 F (36.8 C) 98.3 F (36.8 C) 98.5 F (36.9 C) 97.8 F (36.6 C)  TempSrc: Oral Oral Oral Oral  SpO2: 100% 100% 100% 100%  Weight:   108.5 kg (239 lb 3.2 oz)   Height:        Intake/Output Summary (Last 24 hours) at 06/11/16 1240 Last data filed at 06/11/16 1610  Gross per 24 hour  Intake              480 ml  Output                0 ml  Net              480 ml   Weight change:  Exam:   General:  Pleasant elderly female, morbidly obese, lying comfortably supine in bed this morning. Chronically ill-looking.  Cardiovascular: RRR, S1/S2, no rubs, no gallops. Trace leg edema. JVD ?    2  cmTelemetry: PAF/SR with BBB morphology.  Respiratory: Diminished breath sounds in the bases with occasional bibasal crackles. Poor inspiratory effort. No wheezing. Good air movement. Rest of lung fields clear to auscultation. No increased work of breathing.   Abdomen: Soft/+BS, non tender, non distended, no guarding  Extremities: mild right upper extremity swelling  CNS: Alert and oriented 2. No focal neurological deficits.   Data Reviewed: I have personally reviewed following labs and imaging studies Basic Metabolic Panel:  Recent Labs Lab 06/05/16 0538 06/06/16 0442 06/07/16 0412 06/08/16 0409 06/09/16 0443 06/10/16 0330 06/11/16 0305  NA 133* 134* 131* 132* 131* 132* 133*  K 3.5 3.3* 3.6 3.7 3.6 3.5 3.5  CL 96* 98* 94* 96* 94* 95* 91*  CO2 26 27 25 27 27 27  35*  GLUCOSE 84 92 119* 92 139* 100* 100*  BUN 29* 32* 34* 33* 35* 34* 29*  CREATININE 1.77* 1.83* 1.87* 1.70* 1.71* 1.53* 1.31*  CALCIUM 8.3* 8.5* 8.6* 8.7* 8.5* 8.9 9.3  MG 1.4* 2.0  --   --   --   --   --    Liver Function Tests: No results for input(s):  AST, ALT, ALKPHOS, BILITOT, PROT, ALBUMIN in the last 168 hours. No results for input(s): LIPASE, AMYLASE in the last 168 hours. No results for input(s): AMMONIA in the last 168 hours. Coagulation Profile: No results for input(s): INR, PROTIME in the last 168 hours. CBC:  Recent Labs Lab 06/07/16 0412 06/08/16 0409 06/09/16 0443 06/10/16 0330 06/11/16 0305  WBC 10.6* 9.0 9.5 9.7 8.3  HGB 7.9* 7.4* 7.3* 8.9* 10.4*  HCT 24.0* 22.4* 22.5* 26.7* 31.7*  MCV 94.5 93.7 94.5 89.9 90.1  PLT 186 197 226 235 232   Cardiac Enzymes: No results for input(s): CKTOTAL, CKMB, CKMBINDEX, TROPONINI in the last 168 hours. BNP: Invalid input(s): POCBNP CBG: No results for input(s): GLUCAP in the last 168 hours. HbA1C: No results for input(s): HGBA1C in the last 72 hours. Urine analysis:    Component Value Date/Time   COLORURINE YELLOW 06/02/2016 2235    APPEARANCEUR CLEAR 06/02/2016 2235   LABSPEC 1.012 06/02/2016 2235   PHURINE 7.5 06/02/2016 2235   GLUCOSEU NEGATIVE 06/02/2016 2235   HGBUR SMALL (A) 06/02/2016 2235   BILIRUBINUR NEGATIVE 06/02/2016 2235   KETONESUR NEGATIVE 06/02/2016 2235   PROTEINUR 100 (A) 06/02/2016 2235   UROBILINOGEN 1.0 08/20/2014 1130   NITRITE NEGATIVE 06/02/2016 2235   LEUKOCYTESUR NEGATIVE 06/02/2016 2235   Sepsis Labs: @LABRCNTIP (procalcitonin:4,lacticidven:4) ) Recent Results (from the past 240 hour(s))  Blood culture (routine x 2)     Status: Abnormal   Collection Time: 06/02/16  2:13 PM  Result Value Ref Range Status   Specimen Description BLOOD RIGHT ANTECUBITAL  Final   Special Requests BOTTLES DRAWN AEROBIC AND ANAEROBIC 5CC  Final   Culture  Setup Time   Final    GRAM POSITIVE COCCI IN CLUSTERS IN BOTH AEROBIC AND ANAEROBIC BOTTLES CRITICAL RESULT CALLED TO, READ BACK BY AND VERIFIED WITH: A. Reece LeaderAnderson Pharm.D. 11:15 06/03/16  ([email or web address])    Culture (A)  Final    STAPHYLOCOCCUS SPECIES (COAGULASE NEGATIVE) THE SIGNIFICANCE OF ISOLATING THIS ORGANISM FROM A SINGLE SET OF BLOOD CULTURES WHEN MULTIPLE SETS ARE DRAWN IS UNCERTAIN. PLEASE NOTIFY THE MICROBIOLOGY DEPARTMENT WITHIN ONE WEEK IF SPECIATION AND SENSITIVITIES ARE REQUIRED.    Report Status 06/05/2016 FINAL  Final  Blood Culture ID Panel (Reflexed)     Status: Abnormal   Collection Time: 06/02/16  2:13 PM  Result Value Ref Range Status   Enterococcus species NOT DETECTED NOT DETECTED Final   Listeria monocytogenes NOT DETECTED NOT DETECTED Final   Staphylococcus species DETECTED (A) NOT DETECTED Final    Comment: CRITICAL RESULT CALLED TO, READ BACK BY AND VERIFIED WITH: A. Reece LeaderAnderson Pharm.D. 11:15 06/03/16 ([email or web address])    Staphylococcus aureus NOT DETECTED NOT DETECTED Final   Methicillin resistance DETECTED (A) NOT DETECTED Final    Comment: CRITICAL RESULT CALLED TO, READ BACK BY AND VERIFIED WITH: A. Reece LeaderAnderson Pharm.D. 11:15 06/03/16  ([email or web address])    Streptococcus species NOT DETECTED NOT DETECTED Final   Streptococcus agalactiae NOT DETECTED NOT DETECTED Final   Streptococcus pneumoniae NOT DETECTED NOT DETECTED Final   Streptococcus pyogenes NOT DETECTED NOT DETECTED Final   Acinetobacter baumannii NOT DETECTED NOT DETECTED Final   Enterobacteriaceae species NOT DETECTED NOT DETECTED Final   Enterobacter cloacae complex NOT DETECTED NOT DETECTED Final   Escherichia coli NOT DETECTED NOT DETECTED Final   Klebsiella oxytoca NOT DETECTED NOT DETECTED Final   Klebsiella pneumoniae NOT DETECTED NOT DETECTED Final   Proteus species NOT DETECTED NOT DETECTED Final   Serratia marcescens NOT DETECTED NOT DETECTED  Final   Haemophilus influenzae NOT DETECTED NOT DETECTED Final   Neisseria meningitidis NOT DETECTED NOT DETECTED Final   Pseudomonas aeruginosa NOT DETECTED NOT DETECTED Final   Candida albicans NOT DETECTED NOT DETECTED Final   Candida glabrata NOT DETECTED NOT DETECTED Final   Candida krusei NOT DETECTED NOT DETECTED Final   Candida parapsilosis NOT DETECTED NOT DETECTED Final   Candida tropicalis NOT DETECTED NOT DETECTED Final  Blood culture (routine x 2)     Status: None   Collection Time: 06/02/16  7:10 PM  Result Value Ref Range Status   Specimen Description BLOOD LEFT ANTECUBITAL  Final   Special Requests BOTTLES DRAWN AEROBIC AND ANAEROBIC 5CC  Final   Culture NO GROWTH 5 DAYS  Final   Report Status 06/07/2016 FINAL  Final  Urine culture     Status: Abnormal   Collection Time: 06/02/16 10:35 PM  Result Value Ref Range Status   Specimen Description URINE, CATHETERIZED  Final   Special Requests NONE  Final   Culture MULTIPLE SPECIES PRESENT, SUGGEST RECOLLECTION (A)  Final   Report Status 06/05/2016 FINAL  Final  MRSA PCR Screening     Status: None   Collection Time: 06/03/16  9:28 AM  Result Value Ref Range Status   MRSA by PCR NEGATIVE NEGATIVE Final    Comment:        The GeneXpert MRSA Assay  (FDA approved for NASAL specimens only), is one component of a comprehensive MRSA colonization surveillance program. It is not intended to diagnose MRSA infection nor to guide or monitor treatment for MRSA infections.   Respiratory Panel by PCR     Status: None   Collection Time: 06/03/16  9:43 AM  Result Value Ref Range Status   Adenovirus NOT DETECTED NOT DETECTED Final   Coronavirus 229E NOT DETECTED NOT DETECTED Final   Coronavirus HKU1 NOT DETECTED NOT DETECTED Final   Coronavirus NL63 NOT DETECTED NOT DETECTED Final   Coronavirus OC43 NOT DETECTED NOT DETECTED Final   Metapneumovirus NOT DETECTED NOT DETECTED Final   Rhinovirus / Enterovirus NOT DETECTED NOT DETECTED Final   Influenza A NOT DETECTED NOT DETECTED Final   Influenza B NOT DETECTED NOT DETECTED Final   Parainfluenza Virus 1 NOT DETECTED NOT DETECTED Final   Parainfluenza Virus 2 NOT DETECTED NOT DETECTED Final   Parainfluenza Virus 3 NOT DETECTED NOT DETECTED Final   Parainfluenza Virus 4 NOT DETECTED NOT DETECTED Final   Respiratory Syncytial Virus NOT DETECTED NOT DETECTED Final   Bordetella pertussis NOT DETECTED NOT DETECTED Final   Chlamydophila pneumoniae NOT DETECTED NOT DETECTED Final   Mycoplasma pneumoniae NOT DETECTED NOT DETECTED Final     Scheduled Meds: . apixaban  5 mg Oral BID  . atorvastatin  10 mg Oral q1800  . buPROPion  150 mg Oral Daily  . furosemide  40 mg Intravenous BID  . gabapentin  600 mg Oral BID  . metolazone  2.5 mg Oral Daily  . metoprolol succinate  25 mg Oral Daily  . pantoprazole  40 mg Oral Daily  . polyethylene glycol  17 g Oral Daily  . senna  2 tablet Oral Daily  . sodium chloride flush  3 mL Intravenous Q12H  . traMADol  50 mg Oral Q12H   Continuous Infusions: . nitroGLYCERIN 10 mcg/min (06/11/16 0601)    Procedures/Studies: Dg Chest 2 View  Result Date: 06/02/2016 CLINICAL DATA:  Trouble breathing today, weakness EXAM: CHEST  2 VIEW COMPARISON:   05/05/2016  FINDINGS: Median sternotomy wires are again evident. Discoid atelectasis in the right mid lung zone. Linear atelectasis in the left upper lobe. Low lung volumes. Suspect tiny left pleural effusion. Mild to moderate cardiomegaly with mild central congestion. No pneumothorax. Partially visualized spinal hardware. IMPRESSION: 1. Low lung volumes. 2. Linear atelectasis in the right lower lung zone. Linear atelectasis in the left upper lobe. 3. Stable cardiomegaly. Mild central vascular congestion. Suspect tiny left effusion. Electronically Signed   By: Jasmine Pang M.D.   On: 06/02/2016 23:42   Dg Chest Port 1 View  Result Date: 06/07/2016 CLINICAL DATA:  CHF short of breath EXAM: PORTABLE CHEST 1 VIEW COMPARISON:  06/04/2016 FINDINGS: Hypoventilation. Decreased lung volume with mild bibasilar atelectasis Cardiac enlargement without heart failure. CABG changes. Negative for pneumonia or effusion IMPRESSION: Hypoventilation with bibasilar atelectasis. Cardiac enlargement without heart failure. Electronically Signed   By: Marlan Palau M.D.   On: 06/07/2016 08:21    Pleas Koch, MD [facility] [phone]  If 7PM-7AM, please contact night-coverage [email or web address] Password [email or web address] 06/11/2016, 12:40 PM   LOS: 8 days

## 2016-06-11 NOTE — Progress Notes (Signed)
Pt refusing CPAP

## 2016-06-11 NOTE — Care Management Important Message (Signed)
Important Message  Patient Details  Name: [name] Pinzon MRN: [id number] Date of Birth: 05-Jun-1941   Medicare Important Message Given:  Yes    Katty Fretwell 06/11/2016, 10:22 AM

## 2016-06-11 NOTE — Progress Notes (Signed)
PT Cancellation Note  Patient Details Name: Lisa [name] MRN: [id number] DOB: September 28, 1940   Cancelled Treatment:     Pt reports walking in SNF with therapy.  Complained about therapy not doing anything with her in the hospital and then refused to do anything with me due to pain in her left side.  Pt said she is not refusing but is 75 years old and knows when she should and shouldn't move around.  Pt refused to sit on side of bed after encouragement - 'didn't you hear what I said?"   Judson RochHildreth, [name] 06/11/2016, 1:36 PM 06/11/2016   Ranae PalmsElizabeth Eirik Schueler, PT

## 2016-06-11 NOTE — Progress Notes (Signed)
Initial Nutrition Assessment  DOCUMENTATION CODES:   Morbid obesity  INTERVENTION:  Provide 30 ml Pro-Stat BID, each dose provides 100 kcal and 15 grams of protein Provide Glucerna Shakes TID between meals, each supplement provides 220 kcal and 10 grams of protein Provide Multivitamin with minerals daily   NUTRITION DIAGNOSIS:   Inadequate oral intake related to poor appetite as evidenced by meal completion < 50%, per patient/family report.   GOAL:   Patient will meet greater than or equal to 90% of their needs   MONITOR:   PO intake, Supplement acceptance, Skin, I & O's, Labs, Weight trends  REASON FOR ASSESSMENT:   Low [place]    ASSESSMENT:   75 y.o. morbidly obese AA female, SNF resident, with a history of CAD-s/p CABG, DM, CRI-3, HTN, OSA, CAF,-on Eliquis,  dyslipidemia,and chronic diastolic CHF. She was just admitted 05/03/16-05/09/16 for chest pain and CHF. She is a poor historian. [facility] with suspected acute on chronic diastolic CHF, fever, increased SCr, and increased lethargy.   Pt states that she has been eating about 50% less than usual for the past 2 weeks. She states that she has no appetite. Per nursing notes, meal completion has been 0-25% of most meals, 50% of some. Pt is unsure of usual weight. She appears well-nourished on exam. Weight is above previous low weight in the past few months. Pt asking for salt on her food. RD explained why salt is not allowed at this time.   Labs: low sodium, low chloride, elevated BUN/creatinine, low hemoglobin  Diet Order:  Diet Heart Room service appropriate? Yes; Fluid consistency: Thin  Skin:  Wound (see comment) (Stage II Pressure injury on L buttocks)  Last BM:  12/4  Height:   Ht Readings from Last 1 Encounters:  06/06/16 5\' 2"  (1.575 m)    Weight:   Wt Readings from Last 1 Encounters:  06/11/16 239 lb 3.2 oz (108.5 kg)    Ideal Body Weight:  50 kg  BMI:  Body mass index is 43.75  kg/m.  Estimated Nutritional Needs:   Kcal:  1600-1800  Protein:  80-90 grams  Fluid:  1.8-2 L/day  EDUCATION NEEDS:   No education needs identified at this time  Dorothea Ogleeanne Jonus Coble RD, CSP, LDN Inpatient Clinical Dietitian Pager: [phone] After Hours Pager: [phone]

## 2016-06-12 ENCOUNTER — Ambulatory Visit (INDEPENDENT_AMBULATORY_CARE_PROVIDER_SITE_OTHER): Payer: Medicare Other | Admitting: Podiatry

## 2016-06-12 DIAGNOSIS — Z7189 Other specified counseling: Secondary | ICD-10-CM

## 2016-06-12 DIAGNOSIS — Z515 Encounter for palliative care: Secondary | ICD-10-CM

## 2016-06-12 LAB — BASIC METABOLIC PANEL
Anion gap: 12 (ref 5–15)
BUN: 35 mg/dL — AB (ref 6–20)
CHLORIDE: 87 mmol/L — AB (ref 101–111)
CO2: 33 mmol/L — AB (ref 22–32)
Calcium: 9 mg/dL (ref 8.9–10.3)
Creatinine, Ser: 1.42 mg/dL — ABNORMAL HIGH (ref 0.44–1.00)
GFR calc Af Amer: 40 mL/min — ABNORMAL LOW (ref >60)
GFR calc non Af Amer: 35 mL/min — ABNORMAL LOW (ref >60)
GLUCOSE: 93 mg/dL (ref 65–99)
POTASSIUM: 3.5 mmol/L (ref 3.5–5.1)
Sodium: 132 mmol/L — ABNORMAL LOW (ref 135–145)

## 2016-06-12 MED ORDER — METOPROLOL SUCCINATE ER 50 MG PO TB24
50.0000 mg | ORAL_TABLET | Freq: Every day | ORAL | Status: DC
  Administered 2016-06-13: 50 mg via ORAL
  Filled 2016-06-12: qty 1

## 2016-06-12 MED ORDER — METOPROLOL SUCCINATE ER 25 MG PO TB24
25.0000 mg | ORAL_TABLET | Freq: Once | ORAL | Status: AC
  Administered 2016-06-12: 25 mg via ORAL
  Filled 2016-06-12: qty 1

## 2016-06-12 MED ORDER — FUROSEMIDE 80 MG PO TABS
80.0000 mg | ORAL_TABLET | Freq: Every day | ORAL | Status: DC
  Administered 2016-06-12 – 2016-06-13 (×2): 80 mg via ORAL
  Filled 2016-06-12 (×2): qty 1

## 2016-06-12 NOTE — Progress Notes (Signed)
Pt no showed for appt, erroneous encounter

## 2016-06-12 NOTE — Progress Notes (Signed)
Occupational Therapy Treatment Patient Details Name: Lisa [name] MRN: [id number] DOB: 27-Feb-1941 Today's Date: 06/12/2016    History of present illness Pt is a 75 y.o female who presents with chest pain, SOB, decreased PO intake, and diarrhea. PMH includes CAD, HTN, chronic diastolic CHF.   OT comments  Chair - bed transfer +2 Max A. recommend nsg get pt OOB daily with maximove. Pt requires emcouragement to participate with therpy but does not understand why she is so weak. Will continue to follow. Encouraged family to complete BUE strengthening ex with pt.  Follow Up Recommendations  SNF;Supervision/Assistance - 24 hour    Equipment Recommendations  None recommended by OT    Recommendations for Other Services      Precautions / Restrictions Precautions Precautions: Fall Precaution Comments: watch O2 saturations       Mobility Bed Mobility Overal bed mobility: Needs Assistance;+2 for physical assistance Bed Mobility: Rolling;Sit to Supine Rolling: Mod assist;+2 for physical assistance     Sit to supine: Max assist;+2 for physical assistance      Transfers Overall transfer level: Needs assistance Equipment used: 2 person hand held assist   Sit to Stand: Max assist;+2 physical assistance              Balance     Sitting balance-Leahy Scale: Fair       Standing balance-Leahy Scale: Zero                     ADL   Eating/Feeding: Set up   Grooming: Moderate assistance                               Functional mobility during ADLs: Maximal assistance;+2 for physical assistance (stand pivot) General ADL Comments: incontinent of BM. total A to clean up      Vision                     Perception     Praxis      Cognition   Behavior During Therapy: Flat affect Overall Cognitive Status: History of cognitive impairments - at baseline                       Extremity/Trunk Assessment                Exercises Other Exercises Other Exercises: LUE strengthening FF/ABd/ elbow flex/ext against resistance x 10; B elbow flex/ext x 10 Other Exercises: bridging x 5 Other Exercises:  using rail to help with rolling to L    Shoulder Instructions       General Comments      Pertinent Vitals/ Pain       Pain Assessment: Faces Faces Pain Scale: Hurts little more Pain Location: stomach Pain Descriptors / Indicators: Cramping Pain Intervention(s): Limited activity within patient's tolerance  Home Living                                          Prior Functioning/Environment              Frequency  Min 2X/week        Progress Toward Goals  OT Goals(current goals can now be found in the care plan section)  Progress towards OT goals: Progressing toward goals  Acute Rehab OT Goals Patient  Stated Goal: to get better OT Goal Formulation: With patient Time For Goal Achievement: 06/20/16 Potential to Achieve Goals: Good ADL Goals Pt Will Perform Upper Body Bathing: with set-up;with supervision;bed level Pt Will Perform Lower Body Bathing: with mod assist;bed level Pt Will Transfer to Toilet: with mod assist;with +2 assist;bedside commode Pt/caregiver will Perform Home Exercise Program: Increased strength;Both right and left upper extremity;With written HEP provided;With Supervision  Plan Discharge plan remains appropriate    Co-evaluation                 End of Session Equipment Utilized During Treatment: Oxygen;Gait belt   Activity Tolerance Patient tolerated treatment well   Patient Left in bed;with call bell/phone within reach;with bed alarm set;with family/visitor present   Nurse Communication Mobility status;Need for lift equipment        Time: 1347-1405 OT Time Calculation (min): 18 min  Charges: OT General Charges $OT Visit: 1 Procedure OT Treatments $Self Care/Home Management : 8-22 mins  Garron Eline,HILLARY 06/12/2016, 2:10  PM   [facility] Leigha Olberding, OTR/L  [phone] 06/12/2016

## 2016-06-12 NOTE — Progress Notes (Signed)
PROGRESS NOTE  SHEVAWN LANGENBERG UJW:[id number] DOB: June 15, 1941 DOA: 06/02/2016 PCP: Laurena Slimmer, MD   Brief History:  45 ? history of hypertension,  hyperlipidemia,  diastolic CHF,   Recent echo 05/05/16=EF 50-55% grd 1 DDD, PASP 53 mm atrial fibrillation,  coronary artery disease status post CABG x 3 2008  diabetes mellitus type 2 with neuropathy  morbid obesity,  chronic respiratory failure on 2 L  Renal cysts on [place] 05/2016 OSA not on cpap as has no mask  Recent admit 10/27--11/1 with atypical cp presentation  Admitted 11/27 somnolence, lethargy, and increasing shortness of breath.   Upon arrival, O2 saturations were 84% on room air.  had myalgias and arthralgias for the past 2-3 days prior to this admission.   temperature 100.60F with heart rate 105 requiring up to 4 L nasal cannula to maintain adequate oxygen saturation. Initially thought pulmonary infection; therefore, the patient was started on intravenous Zosyn and vancomycin.  As the patient improved clinically and remained afebrile, antibiotics were discontinued on 06/04/2016.  Diuresed for decompensated CHF. Kidney functions worsened.  Cardiology was consulted and patient was placed on NTG drip with IV diuresis.  Renal function slowly improving.  palliative care consultation for goals of care on 12/3    Assessment/Plan: Acute on chronic respiratory failure with hypoxia - Secondary to decompensated CHF. At baseline, patient is on 2 L nasal cannula. Wean oxygen as tolerated back to baseline.  -Flu panel PCR and RSV panel negative.  Acute on chronic diastolic CHF -During her office visit 05/03/2016, the patient's weight was 232 pounds. Admission weight 254 pounds. Suspect inaccurate daily weights and intake output in the hospital.  - She was initially treated with IV Lasix which was transitioned to oral after clinical improvement. Creatinine gradually increased from 1.47 on 11/27 to 1.83 on 11/30. She  remained volume overloaded. We may have to accept higher creatinine related to diuresis. Cardiology started NTG infusion to help with blood pressure and treat pulmonary edema and recommended considering PRBC transfusion. - As per cardiology follow-up 12/4, changing Lasix to 40 mg IV to 80 po od , metolazone 2.5   Fever -sepsis ruled out. Flu panel PCR, RSV panel, UA and chest x-ray negative for features of infection.  - No further fevers.   ParoxysmalAtrial fibrillation -Continue apixaban and metoprolol succinate. CHADSVASc = 6 (CHF, HTN, Age, CAD, Female) -Rate controlled  Acute Encephalopathy - multifactorial including decompensated CHF, hypoxia and acute on chronic renal failure - B12--406 -TSH--7.344--> repeated TSH: 4.52 and free T4: 1.44. Clinically euthyroid. Recommend repeating full TFTs in 4-6 weeks. Ammonia--22 - Acute mental status changes resolved.   Acute on chronic renal failure--CKD stage III -Baseline creatinine 1.1-1.4 - Creatinine peaked to 1.8. Creatinine has improved to 1.5 on 12/4 .  -We will need outpatient creatinine in about one week  Hypertension -Mildly uncontrolled. Continue metoprolol succinate. Amlodipine was discontinued earlier due to soft blood pressures and may need to be resumed. Nitrates as above. Monitor  Foot rash -This appears to be ecchymosis on examination--nonblanching -Suspect the patient had recent trauma in the setting of apixiban use -No signs of infection  OSA  -- the patient is tolerating CPAP therapy well without any problems at home.   Hyperlipidemia -continue statin  Hypomagnesemia - Replaced.  Anemia - No bleeding reported. Hemoglobin gradually dropping 9.5 > 7.9 >7.9 >7.4 >7.3. No overt bleeding reported. S/p 1 unit of PRBC on 12/3. Hemoglobin improved to 8.9. -  FOBT has been requested couple of days back but not sent, discussed with nursing.  -Worsening anemia likely secondary to acute illness, chronic disease and  phlebotomies in the hospital.   Coagulase negative blood culture in one of 2 bottles - Likely contaminant. No intervention.  Hypokalemia - Replaced.  Right upper extremity swelling - Patient states that she has had this for approximately 2 weeks. Denies pain. May be asymmetrical edema . Venous Doppler negative for DVT. Elevated extremity. Good peripheral pulsations. Improving.  Adult failure to thrive - Multifactorial secondary to advanced age, possible dementia and multiple severe comorbidities and acute illness. Discussed in detail with patient's daughter from [place] on 12/3 and she was agreeable to palliative care consultation-requested 12/3 and was completed on 06/12/2016 Patient not willing to change CODE STATUS from full code Willing to treat    Disposition Plan:   DC to SNF when medically stable. Family Communication:   discussed with family at the bedside  Consultants:   cardiology  Code Status:  FULL  DVT Prophylaxis: apixaban   Procedures: As Listed in Progress Note Above  Antibiotics: None    Subjective:   Doing fair States occasional pain in the lower chest but no radiation or other alarm signs    Objective: Vitals:   06/11/16 0709 06/11/16 1239 06/11/16 2007 06/12/16 0536  BP: (!) 148/65 (!) 152/66 (!) 143/85 (!) 137/95  Pulse: 83 82 79 82  Resp: 18 18 17 18   Temp: 98.5 F (36.9 C) 97.8 F (36.6 C) 99.4 F (37.4 C) 98 F (36.7 C)  TempSrc: Oral Oral Oral Oral  SpO2: 100% 100% 100% 99%  Weight: 108.5 kg (239 lb 3.2 oz)   108.5 kg (239 lb 4.8 oz)  Height:        Intake/Output Summary (Last 24 hours) at 06/12/16 1130 Last data filed at 06/12/16 0944  Gross per 24 hour  Intake              412 ml  Output                0 ml  Net              412 ml   Weight change:  Exam:   General:  Pleasant elderly female, morbidly obese, lying comfortably supine in bed this morning. Chronically ill-looking.  Cardiovascular: RRR, S1/S2, no  rubs, no gallops. Trace leg edema. JVD ?    2 cmTelemetry: PAF/SR with BBB morphology.  Respiratory: Diminished breath sounds in the bases with occasional bibasal crackles. Poor inspiratory effort. No wheezing. Good air movement. Rest of lung fields clear to auscultation. No increased work of breathing.   Abdomen: Soft/+BS, non tender, non distended, no guarding  Extremities: mild right upper extremity swelling  CNS: Alert and oriented 2. No focal neurological deficits.   Data Reviewed: I have personally reviewed following labs and imaging studies Basic Metabolic Panel:  Recent Labs Lab 06/06/16 0442  06/08/16 0409 06/09/16 0443 06/10/16 0330 06/11/16 0305 06/12/16 1008  NA 134*  < > 132* 131* 132* 133* 132*  K 3.3*  < > 3.7 3.6 3.5 3.5 3.5  CL 98*  < > 96* 94* 95* 91* 87*  CO2 27  < > 27 27 27  35* 33*  GLUCOSE 92  < > 92 139* 100* 100* 93  BUN 32*  < > 33* 35* 34* 29* 35*  CREATININE 1.83*  < > 1.70* 1.71* 1.53* 1.31* 1.42*  CALCIUM 8.5*  < >  8.7* 8.5* 8.9 9.3 9.0  MG 2.0  --   --   --   --   --   --   < > = values in this interval not displayed. Liver Function Tests: No results for input(s): AST, ALT, ALKPHOS, BILITOT, PROT, ALBUMIN in the last 168 hours. No results for input(s): LIPASE, AMYLASE in the last 168 hours. No results for input(s): AMMONIA in the last 168 hours. Coagulation Profile: No results for input(s): INR, PROTIME in the last 168 hours. CBC:  Recent Labs Lab 06/07/16 0412 06/08/16 0409 06/09/16 0443 06/10/16 0330 06/11/16 0305  WBC 10.6* 9.0 9.5 9.7 8.3  HGB 7.9* 7.4* 7.3* 8.9* 10.4*  HCT 24.0* 22.4* 22.5* 26.7* 31.7*  MCV 94.5 93.7 94.5 89.9 90.1  PLT 186 197 226 235 232   Cardiac Enzymes: No results for input(s): CKTOTAL, CKMB, CKMBINDEX, TROPONINI in the last 168 hours. BNP: Invalid input(s): POCBNP CBG: No results for input(s): GLUCAP in the last 168 hours. HbA1C: No results for input(s): HGBA1C in the last 72 hours. Urine  analysis:    Component Value Date/Time   COLORURINE YELLOW 06/02/2016 2235   APPEARANCEUR CLEAR 06/02/2016 2235   LABSPEC 1.012 06/02/2016 2235   PHURINE 7.5 06/02/2016 2235   GLUCOSEU NEGATIVE 06/02/2016 2235   HGBUR SMALL (A) 06/02/2016 2235   BILIRUBINUR NEGATIVE 06/02/2016 2235   KETONESUR NEGATIVE 06/02/2016 2235   PROTEINUR 100 (A) 06/02/2016 2235   UROBILINOGEN 1.0 08/20/2014 1130   NITRITE NEGATIVE 06/02/2016 2235   LEUKOCYTESUR NEGATIVE 06/02/2016 2235   Sepsis Labs: @LABRCNTIP (procalcitonin:4,lacticidven:4) ) Recent Results (from the past 240 hour(s))  Blood culture (routine x 2)     Status: Abnormal   Collection Time: 06/02/16  2:13 PM  Result Value Ref Range Status   Specimen Description BLOOD RIGHT ANTECUBITAL  Final   Special Requests BOTTLES DRAWN AEROBIC AND ANAEROBIC 5CC  Final   Culture  Setup Time   Final    GRAM POSITIVE COCCI IN CLUSTERS IN BOTH AEROBIC AND ANAEROBIC BOTTLES CRITICAL RESULT CALLED TO, READ BACK BY AND VERIFIED WITH: A. Reece LeaderAnderson Pharm.D. 11:15 06/03/16  ([email or web address])    Culture (A)  Final    STAPHYLOCOCCUS SPECIES (COAGULASE NEGATIVE) THE SIGNIFICANCE OF ISOLATING THIS ORGANISM FROM A SINGLE SET OF BLOOD CULTURES WHEN MULTIPLE SETS ARE DRAWN IS UNCERTAIN. PLEASE NOTIFY THE MICROBIOLOGY DEPARTMENT WITHIN ONE WEEK IF SPECIATION AND SENSITIVITIES ARE REQUIRED.    Report Status 06/05/2016 FINAL  Final  Blood Culture ID Panel (Reflexed)     Status: Abnormal   Collection Time: 06/02/16  2:13 PM  Result Value Ref Range Status   Enterococcus species NOT DETECTED NOT DETECTED Final   Listeria monocytogenes NOT DETECTED NOT DETECTED Final   Staphylococcus species DETECTED (A) NOT DETECTED Final    Comment: CRITICAL RESULT CALLED TO, READ BACK BY AND VERIFIED WITH: A. Reece LeaderAnderson Pharm.D. 11:15 06/03/16 ([email or web address])    Staphylococcus aureus NOT DETECTED NOT DETECTED Final   Methicillin resistance DETECTED (A) NOT DETECTED Final    Comment: CRITICAL RESULT  CALLED TO, READ BACK BY AND VERIFIED WITH: A. Reece LeaderAnderson Pharm.D. 11:15 06/03/16 ([email or web address])    Streptococcus species NOT DETECTED NOT DETECTED Final   Streptococcus agalactiae NOT DETECTED NOT DETECTED Final   Streptococcus pneumoniae NOT DETECTED NOT DETECTED Final   Streptococcus pyogenes NOT DETECTED NOT DETECTED Final   Acinetobacter baumannii NOT DETECTED NOT DETECTED Final   Enterobacteriaceae species NOT DETECTED NOT DETECTED Final   Enterobacter cloacae complex NOT DETECTED NOT DETECTED Final  Escherichia coli NOT DETECTED NOT DETECTED Final   Klebsiella oxytoca NOT DETECTED NOT DETECTED Final   Klebsiella pneumoniae NOT DETECTED NOT DETECTED Final   Proteus species NOT DETECTED NOT DETECTED Final   Serratia marcescens NOT DETECTED NOT DETECTED Final   Haemophilus influenzae NOT DETECTED NOT DETECTED Final   Neisseria meningitidis NOT DETECTED NOT DETECTED Final   Pseudomonas aeruginosa NOT DETECTED NOT DETECTED Final   Candida albicans NOT DETECTED NOT DETECTED Final   Candida glabrata NOT DETECTED NOT DETECTED Final   Candida krusei NOT DETECTED NOT DETECTED Final   Candida parapsilosis NOT DETECTED NOT DETECTED Final   Candida tropicalis NOT DETECTED NOT DETECTED Final  Blood culture (routine x 2)     Status: None   Collection Time: 06/02/16  7:10 PM  Result Value Ref Range Status   Specimen Description BLOOD LEFT ANTECUBITAL  Final   Special Requests BOTTLES DRAWN AEROBIC AND ANAEROBIC 5CC  Final   Culture NO GROWTH 5 DAYS  Final   Report Status 06/07/2016 FINAL  Final  Urine culture     Status: Abnormal   Collection Time: 06/02/16 10:35 PM  Result Value Ref Range Status   Specimen Description URINE, CATHETERIZED  Final   Special Requests NONE  Final   Culture MULTIPLE SPECIES PRESENT, SUGGEST RECOLLECTION (A)  Final   Report Status 06/05/2016 FINAL  Final  MRSA PCR Screening     Status: None   Collection Time: 06/03/16  9:28 AM  Result Value Ref Range Status   MRSA  by PCR NEGATIVE NEGATIVE Final    Comment:        The GeneXpert MRSA Assay (FDA approved for NASAL specimens only), is one component of a comprehensive MRSA colonization surveillance program. It is not intended to diagnose MRSA infection nor to guide or monitor treatment for MRSA infections.   Respiratory Panel by PCR     Status: None   Collection Time: 06/03/16  9:43 AM  Result Value Ref Range Status   Adenovirus NOT DETECTED NOT DETECTED Final   Coronavirus 229E NOT DETECTED NOT DETECTED Final   Coronavirus HKU1 NOT DETECTED NOT DETECTED Final   Coronavirus NL63 NOT DETECTED NOT DETECTED Final   Coronavirus OC43 NOT DETECTED NOT DETECTED Final   Metapneumovirus NOT DETECTED NOT DETECTED Final   Rhinovirus / Enterovirus NOT DETECTED NOT DETECTED Final   Influenza A NOT DETECTED NOT DETECTED Final   Influenza B NOT DETECTED NOT DETECTED Final   Parainfluenza Virus 1 NOT DETECTED NOT DETECTED Final   Parainfluenza Virus 2 NOT DETECTED NOT DETECTED Final   Parainfluenza Virus 3 NOT DETECTED NOT DETECTED Final   Parainfluenza Virus 4 NOT DETECTED NOT DETECTED Final   Respiratory Syncytial Virus NOT DETECTED NOT DETECTED Final   Bordetella pertussis NOT DETECTED NOT DETECTED Final   Chlamydophila pneumoniae NOT DETECTED NOT DETECTED Final   Mycoplasma pneumoniae NOT DETECTED NOT DETECTED Final     Scheduled Meds: . apixaban  5 mg Oral BID  . atorvastatin  10 mg Oral q1800  . buPROPion  150 mg Oral Daily  . feeding supplement (GLUCERNA SHAKE)  237 mL Oral TID BM  . feeding supplement (PRO-STAT SUGAR FREE 64)  30 mL Oral BID  . furosemide  40 mg Intravenous BID  . gabapentin  600 mg Oral BID  . hydrALAZINE  25 mg Oral Q8H  . isosorbide mononitrate  30 mg Oral Daily  . metolazone  2.5 mg Oral Daily  . metoprolol succinate  25  mg Oral Daily  . multivitamin with minerals  1 tablet Oral Daily  . pantoprazole  40 mg Oral Daily  . polyethylene glycol  17 g Oral Daily  . senna  2  tablet Oral Daily  . sodium chloride flush  3 mL Intravenous Q12H  . traMADol  50 mg Oral Q12H   Continuous Infusions:   Procedures/Studies: Dg Chest 2 View  Result Date: 06/02/2016 CLINICAL DATA:  Trouble breathing today, weakness EXAM: CHEST  2 VIEW COMPARISON:  05/05/2016 FINDINGS: Median sternotomy wires are again evident. Discoid atelectasis in the right mid lung zone. Linear atelectasis in the left upper lobe. Low lung volumes. Suspect tiny left pleural effusion. Mild to moderate cardiomegaly with mild central congestion. No pneumothorax. Partially visualized spinal hardware. IMPRESSION: 1. Low lung volumes. 2. Linear atelectasis in the right lower lung zone. Linear atelectasis in the left upper lobe. 3. Stable cardiomegaly. Mild central vascular congestion. Suspect tiny left effusion. Electronically Signed   By: Jasmine PangKim  Fujinaga M.D.   On: 06/02/2016 23:42   Dg Chest Port 1 View  Result Date: 06/07/2016 CLINICAL DATA:  CHF short of breath EXAM: PORTABLE CHEST 1 VIEW COMPARISON:  06/04/2016 FINDINGS: Hypoventilation. Decreased lung volume with mild bibasilar atelectasis Cardiac enlargement without heart failure. CABG changes. Negative for pneumonia or effusion IMPRESSION: Hypoventilation with bibasilar atelectasis. Cardiac enlargement without heart failure. Electronically Signed   By: Marlan Palauharles  Clark M.D.   On: 06/07/2016 08:21    Pleas KochJai Zahra Peffley, MD [facility] [phone]([phone]  If 7PM-7AM, please contact night-coverage [email or web address] Password [email or web address] 06/12/2016, 11:30 AM   LOS: 9 days

## 2016-06-12 NOTE — Progress Notes (Signed)
Physical Therapy Treatment Patient Details Name: Lisa [name] MRN: [id number] DOB: 1941/03/28 Today's Date: 06/12/2016    History of Present Illness Pt is a 75 y.o female who presents with chest pain, SOB, decreased PO intake, and diarrhea. PMH includes CAD, HTN, chronic diastolic CHF.    PT Comments    Patient reluctant to participate in therapies today. Educated patient on importance of OOB and risks related to immobility. Tolerated some supine therapeutic exercises. Max encouragement to come to EOB. [facility] and [facility] (incontinent of stool and bladder). Once clean, assist patient to bedside recliner. At this time, question patient motivations and goals related to rehab and recovery.   Follow Up Recommendations  SNF     Equipment Recommendations  None recommended by PT    Recommendations for Other Services       Precautions / Restrictions Precautions Precautions: Fall Precaution Comments: watch O2 saturations Restrictions Weight Bearing Restrictions: No    Mobility  Bed Mobility Overal bed mobility: Needs Assistance Bed Mobility: Supine to Sit;Sit to Supine     Supine to sit: Max assist Sit to supine: Max assist;+2 for physical assistance   General bed mobility comments: HOB elevated, use of chuck pad and LUE to pull to sit using rail and therapist  Transfers Overall transfer level: Needs assistance Equipment used:  (UE support via stedy lift)   Sit to Stand: Max assist;+2 physical assistance Stand pivot transfers:  (using lift equipment)       General transfer comment: unable to assess  Ambulation/Gait                 Stairs            Wheelchair Mobility    Modified Rankin (Stroke Patients Only)       Balance   Sitting-balance support: Feet supported Sitting balance-Leahy Scale: Fair Sitting balance - Comments: able to sit EOB and on BSC without assist     Standing balance-Leahy Scale:  Zero Standing balance comment: requires bilateral UE support via rail, and physical assist to maintain static standing                    Cognition Arousal/Alertness: Awake/alert Behavior During Therapy: Flat affect Overall Cognitive Status: History of cognitive impairments - at baseline                      Exercises Other Exercises Other Exercises: Performed heel slides, ankle pumps (with resistance), and quad sets x5 while supine in bed.  Other Exercises: perform sit <> stand facing bed with bedrail for UE support,x3 during hygiene and pericare.    General Comments General comments (skin integrity, edema, etc.): incontinent of stool and bladder      Pertinent Vitals/Pain Pain Assessment: Faces Faces Pain Scale: Hurts little more Pain Location: right UE Pain Descriptors / Indicators: Sore Pain Intervention(s): Monitored during session    Home Living                      Prior Function            PT Goals (current goals can now be found in the care plan section) Acute Rehab PT Goals Patient Stated Goal: to get better PT Goal Formulation: With patient Time For Goal Achievement: 06/19/16 Potential to Achieve Goals: Fair    Frequency    Min 2X/week      PT Plan Current plan remains appropriate  Co-evaluation             End of Session Equipment Utilized During Treatment: Gait belt Activity Tolerance: Patient limited by fatigue Patient left: in chair;with call bell/phone within reach;with chair alarm set     Time: [id number] PT Time Calculation (min) (ACUTE ONLY): 19 min  Charges:  $Therapeutic Activity: 8-22 mins                    G Codes:      Fabio AsaDevon J Lessa Huge 06/12/2016, 10:41 AM Lisa [name], PT DPT  [phone]

## 2016-06-12 NOTE — Progress Notes (Signed)
Patient Name: Lisa [name] Date of Encounter: 06/12/2016  Primary Cardiologist: [facility]  Hospital Problem List     Principal Problem:   Acute on chronic respiratory failure with hypoxia [facility](HCC) Active Problems:   GERD (gastroesophageal reflux disease)   Hypertension   Morbid obesity (HCC)   Chronic diastolic CHF (congestive heart failure) (HCC)   Persistent atrial fibrillation (HCC)   Sepsis (HCC)   Acute on chronic diastolic CHF (congestive heart failure) (HCC)   Influenza-like illness   Rash and nonspecific skin eruption   Pressure injury of skin   Chronic atrial fibrillation (HCC)   Chronic anticoagulation   Anemia   Acute on chronic renal insufficiency   Subjective   The patient feels better today, improved SOB, but still seems dyspneic.   Inpatient Medications    Scheduled Meds: . apixaban  5 mg Oral BID  . atorvastatin  10 mg Oral q1800  . buPROPion  150 mg Oral Daily  . feeding supplement (GLUCERNA SHAKE)  237 mL Oral TID BM  . feeding supplement (PRO-STAT SUGAR FREE 64)  30 mL Oral BID  . furosemide  40 mg Intravenous BID  . gabapentin  600 mg Oral BID  . hydrALAZINE  25 mg Oral Q8H  . isosorbide mononitrate  30 mg Oral Daily  . metolazone  2.5 mg Oral Daily  . metoprolol succinate  25 mg Oral Daily  . multivitamin with minerals  1 tablet Oral Daily  . pantoprazole  40 mg Oral Daily  . polyethylene glycol  17 g Oral Daily  . senna  2 tablet Oral Daily  . sodium chloride flush  3 mL Intravenous Q12H  . traMADol  50 mg Oral Q12H   Continuous Infusions:  PRN Meds: acetaminophen **OR** acetaminophen, fluticasone, ipratropium-albuterol, ondansetron **OR** ondansetron (ZOFRAN) IV, sodium chloride flush   Vital Signs    Vitals:   06/11/16 0709 06/11/16 1239 06/11/16 2007 06/12/16 0536  BP: (!) 148/65 (!) 152/66 (!) 143/85 (!) 137/95  Pulse: 83 82 79 82  Resp: 18 18 17 18   Temp: 98.5 F (36.9 C) 97.8 F (36.6 C) 99.4 F (37.4 C) 98 F (36.7 C)    TempSrc: Oral Oral Oral Oral  SpO2: 100% 100% 100% 99%  Weight: 239 lb 3.2 oz (108.5 kg)   239 lb 4.8 oz (108.5 kg)  Height:        Intake/Output Summary (Last 24 hours) at 06/12/16 0914 Last data filed at 06/11/16 [id number]  Gross per 24 hour  Intake                0 ml  Output                0 ml  Net                0 ml   Filed Weights   06/10/16 0401 06/11/16 0709 06/12/16 0536  Weight: 249 lb 14.4 oz (113.4 kg) 239 lb 3.2 oz (108.5 kg) 239 lb 4.8 oz (108.5 kg)    Physical Exam  GEN: Obese AA female, in no acute distress. Wearing Wall.  HEENT: Grossly normal.  Neck: Supple, no JVD, or masses. Cardiac: RRR, normal rate, no murmurs, rubs, or gallops. 1+ pitting pretibial edema.   Respiratory:  Diminished bilaterally. GI: Soft, obese MS: no deformity or atrophy. Neuro:  Strength and sensation are intact. Psych: AAOx3.  Normal affect.  Labs    CBC  Recent Labs  06/10/16 0330 06/11/16 0305  WBC  9.7 8.3  HGB 8.9* 10.4*  HCT 26.7* 31.7*  MCV 89.9 90.1  PLT 235 232   Basic Metabolic Panel  Recent Labs  06/10/16 0330 06/11/16 0305  NA 132* 133*  K 3.5 3.5  CL 95* 91*  CO2 27 35*  GLUCOSE 100* 100*  BUN 34* 29*  CREATININE 1.53* 1.31*  CALCIUM 8.9 9.3   Telemetry     SR with PVCs, and PACs - Personally Reviewed  ECG    N/A - Personally Reviewed  Radiology    No results found.  Cardiac Studies   TTE: 06/05/16 - Left ventricle: The cavity size was normal. Wall thickness was   increased in a pattern of moderate LVH. Systolic function was   normal. The estimated ejection fraction was in the range of 50%   to 55%. Wall motion was normal; there were no regional wall   motion abnormalities. Doppler parameters are consistent with   abnormal left ventricular relaxation (grade 1 diastolic   dysfunction). - Mitral valve: Mildly calcified annulus. - Left atrium: The atrium was moderately dilated. - Tricuspid valve: There was mild-moderate regurgitation. -  Pulmonary arteries: Systolic pressure was moderately increased.   PA peak pressure: 53 mm Hg (S).  Patient Profile     75 y.o.morbidly obese AA female, SNF resident,with a history of CAD-s/p CABG, DM, CRI-3, HTN, OSA, CAF,-on Eliquis, dyslipidemia,andchronic diastolic CHF. She was just admitted 05/03/16-05/09/16 for chest pain and CHF. She is a poor historian. [facility] with suspected acute on chronic diastolic CHF, fever, increased SCr, and increased lethargy.   Assessment & Plan    1. Acute on chronic diastolic heart failure: accurate output not recorded as patient has been incontinent. She does report increased frequency since diuretic adjustments. Weight is down 252>>239lbs, baseline 232.  --Great response to iv diuretics, seems an additional day would benefit her. --Crea improved 1.53-> 1.31 yesterday. BMET pending this morning.  -- transitioned to imdur 30 mg po daily and hydralazine 25 mg po TID from IV nitro.  Anemia and morbid obesity/hypoventilation also playing a role in her acute on chronic respiratory failure.   2. Atrial fibrillation: Appears to be in SR today with PVCs, and PACs noted. -- Anticoagulated with apixaban. Rate controlled with metoprolol.  3. HTN: Was on nitro drip, switched to imdur/hydralazine. Improved, but still remains somewhat elevated. May need to further increase hydralazine.   4. Anemia: Received transfusion  Hgb 8.9 ->10.4.  Signed, Laverda PageLindsay Roberts, NP-C 06/12/2016  The patient was seen, examined and discussed with Laverda PageLindsay Roberts, NP-C and I agree with the above.   75 year old female with morbid obesity, admitted with acute on chronic diastolic CHF, responded well to iv diuretics, now down 14 lbs to almost her baseline of 232 lbs, I will switch to PO lasix 80 mg PO BID and start PT&OT as she is severely deconditioned.  Probably discharge tomorrow.  She remains in SR with a short run of a-fib with RVR earlier today. BP  elevated, I would increase Toprol XL to 50 mg po daily.   Tobias AlexanderKatarina Jakaiden Fill, MD 06/12/2016

## 2016-06-13 ENCOUNTER — Telehealth: Payer: Self-pay | Admitting: Cardiology

## 2016-06-13 ENCOUNTER — Non-Acute Institutional Stay (SKILLED_NURSING_FACILITY): Payer: Medicare Other | Admitting: Internal Medicine

## 2016-06-13 ENCOUNTER — Ambulatory Visit: Payer: Self-pay | Admitting: *Deleted

## 2016-06-13 ENCOUNTER — Encounter: Payer: Self-pay | Admitting: Internal Medicine

## 2016-06-13 DIAGNOSIS — G4733 Obstructive sleep apnea (adult) (pediatric): Secondary | ICD-10-CM

## 2016-06-13 DIAGNOSIS — N189 Chronic kidney disease, unspecified: Secondary | ICD-10-CM | POA: Diagnosis not present

## 2016-06-13 DIAGNOSIS — J80 Acute respiratory distress syndrome: Secondary | ICD-10-CM | POA: Diagnosis not present

## 2016-06-13 DIAGNOSIS — R1312 Dysphagia, oropharyngeal phase: Secondary | ICD-10-CM | POA: Diagnosis not present

## 2016-06-13 DIAGNOSIS — I5033 Acute on chronic diastolic (congestive) heart failure: Secondary | ICD-10-CM | POA: Diagnosis not present

## 2016-06-13 DIAGNOSIS — I480 Paroxysmal atrial fibrillation: Secondary | ICD-10-CM | POA: Diagnosis not present

## 2016-06-13 DIAGNOSIS — D649 Anemia, unspecified: Secondary | ICD-10-CM

## 2016-06-13 DIAGNOSIS — N183 Chronic kidney disease, stage 3 (moderate): Secondary | ICD-10-CM | POA: Diagnosis not present

## 2016-06-13 DIAGNOSIS — M6281 Muscle weakness (generalized): Secondary | ICD-10-CM | POA: Diagnosis not present

## 2016-06-13 DIAGNOSIS — E785 Hyperlipidemia, unspecified: Secondary | ICD-10-CM | POA: Diagnosis not present

## 2016-06-13 DIAGNOSIS — Z515 Encounter for palliative care: Secondary | ICD-10-CM

## 2016-06-13 DIAGNOSIS — N289 Disorder of kidney and ureter, unspecified: Secondary | ICD-10-CM | POA: Diagnosis not present

## 2016-06-13 DIAGNOSIS — J9621 Acute and chronic respiratory failure with hypoxia: Secondary | ICD-10-CM

## 2016-06-13 DIAGNOSIS — Z5181 Encounter for therapeutic drug level monitoring: Secondary | ICD-10-CM | POA: Diagnosis not present

## 2016-06-13 DIAGNOSIS — N179 Acute kidney failure, unspecified: Secondary | ICD-10-CM | POA: Diagnosis not present

## 2016-06-13 DIAGNOSIS — I129 Hypertensive chronic kidney disease with stage 1 through stage 4 chronic kidney disease, or unspecified chronic kidney disease: Secondary | ICD-10-CM | POA: Diagnosis not present

## 2016-06-13 DIAGNOSIS — R531 Weakness: Secondary | ICD-10-CM | POA: Diagnosis not present

## 2016-06-13 DIAGNOSIS — R488 Other symbolic dysfunctions: Secondary | ICD-10-CM | POA: Diagnosis not present

## 2016-06-13 DIAGNOSIS — Z7189 Other specified counseling: Secondary | ICD-10-CM

## 2016-06-13 MED ORDER — HYDRALAZINE HCL 25 MG PO TABS: 25.0000 mg | ORAL_TABLET | Freq: Three times a day (TID) | ORAL | 0 refills | Status: DC

## 2016-06-13 MED ORDER — METOLAZONE 2.5 MG PO TABS: 2.5000 mg | ORAL_TABLET | Freq: Every day | ORAL | 0 refills | Status: DC

## 2016-06-13 MED ORDER — FUROSEMIDE 80 MG PO TABS: 80.0000 mg | ORAL_TABLET | Freq: Every day | ORAL | 0 refills | Status: DC

## 2016-06-13 MED ORDER — METOPROLOL SUCCINATE ER 50 MG PO TB24: 50.0000 mg | ORAL_TABLET | Freq: Every day | ORAL | 0 refills | Status: DC

## 2016-06-13 MED ORDER — ISOSORBIDE MONONITRATE ER 30 MG PO TB24: 30.0000 mg | ORAL_TABLET | Freq: Every day | ORAL | 0 refills | Status: DC

## 2016-06-13 NOTE — Clinical Social Work Placement (Signed)
   CLINICAL SOCIAL WORK PLACEMENT  NOTE  Date:  06/13/2016  Patient Details  Name: Lisa SchneidersGrace A Kervin MRN: [id number] Date of Birth: Sep 14, 1940  Clinical Social Work is seeking post-discharge placement for this patient at the Skilled  Nursing Facility level of care (*CSW will initial, date and re-position this form in  chart as items are completed):  Yes   Patient/family provided with [facility] Clinical Social Work Department's list of facilities offering this level of care within the geographic area requested by the patient (or if unable, by the patient's family).  Yes   Patient/family informed of their freedom to choose among providers that offer the needed level of care, that participate in Medicare, Medicaid or managed care program needed by the patient, have an available bed and are willing to accept the patient.  Yes   Patient/family informed of [facility]'s ownership interest in [facility] and [facility], as well as of the fact that they are under no obligation to receive care at these facilities.  PASRR submitted to EDS on 06/05/16     PASRR number received on       Existing PASRR number confirmed on 06/05/16     FL2 transmitted to all facilities in geographic area requested by pt/family on 06/13/16     FL2 transmitted to all facilities within larger geographic area on       Patient informed that his/her managed care company has contracts with or will negotiate with certain facilities, including the following:        Yes   Patient/family informed of bed offers received.  [facility]     Physician recommends and patient chooses bed at      [facility] on 06/13/16.  Patient to be transferred to facility by [facility]     Patient family notified on 06/13/16 of transfer.  Name of family member notified:  Santina Evansatherine     PHYSICIAN Please prepare prescriptions     Additional Comment:     _______________________________________________ Margarito LinerSarah C Doneisha Ivey, LCSW 06/13/2016, 1:29 PM

## 2016-06-13 NOTE — Clinical Social Work Note (Signed)
[facility] has no beds available today. CSW notified patient and her daughter Santina EvansCatherine [phone]). [facility] and [facility]. CSW has spoken with Velna HatchetSheila, admissions coordinator at [facility] and she will review chart and call CSW back in 20 minutes with determination. She has to get prior approval since patient is coming with palliative services.  Charlynn CourtSarah Aithana Kushner, CSW [phone]

## 2016-06-13 NOTE — Assessment & Plan Note (Signed)
O2 sats 99% on O2.

## 2016-06-13 NOTE — Assessment & Plan Note (Deleted)
Creatinine has dropped from 1.8 to a value of 1.42. BMET will be monitored.

## 2016-06-13 NOTE — Assessment & Plan Note (Signed)
Monitor for any GI blood loss in context of history of GERD and Eliquis therapy .FOBT ordered in the hospital was never completed. She did receive 1 unit of red packed cells. Hematocrit has continued to climb and was 31.7 at discharge.

## 2016-06-13 NOTE — Consult Note (Signed)
Consultation Note Date: 06/12/2016  Patient Name: Lisa [name]  DOB: 17-Apr-1941  MRN: [id number]  Age / Sex: 75 y.o., female  PCP: Foye Spurling, MD Referring Physician: Nita Sells, MD  Reason for Consultation: Establishing goals of care  HPI/Patient Profile: 75 y.o. female  with past medical history of HTN, HLD, CAD s/p CABG, diastolic CHF, A. fib, OSA, DM type II, morbid obesity, oxygen dependent on 2L admitted on 06/02/2016 with AMS, respiratory distress, BLE edema, fever and r/o sepsis but treated this admission for CHF exacerbation and anemia.   Clinical Assessment and Goals of Care: I met today at Lisa [name]'s bedside along with her 2 daughters and 2 granddaughters. There was much confusion (and accusing/yelling) among family members for not telling them what is going on and explaining her heart failure. I spent much time reviewing Lisa [name]'s health issues and reviewing history and records with family. Discussed natural trajectory of disease including heart failure as well. Family's only concern is to hear that she is currently stable. However, Lisa [name] did speak up about these diseases causing her death and I confirm that these are terminal illnesses. When I addressed resuscitation her daughter quickly spoke up and said "she doesn't want no DNR." When Lisa [name] was able to speak for herself she expressed that she would want everything done with the goal of allowing time for her daughter from [place] to arrive to her bedside.   They all express that their goal now if for Lisa [name] to continue to improve enough to return back home (she was living independently until recently). We discuss importance of adherence to prescribed therapies including aggressive physical therapy and CPAP. Discussed warning signs of fluid overload with family and encouraged them to act quickly when edema,  SOB is noted. Family struggling with information and at times and also very clear that they do not always agree or get along. However her 2 daughters seem to agree and get along and they would be her legal surrogate decision makers with no documented HCPOA.   Primary Decision Maker NEXT OF KIN her 2 daughters - Malachy Mood lives locally    SUMMARY OF RECOMMENDATIONS   - Continue full aggressive care - Transition to SNF rehab - Could benefit from outpatient palliative services at SNF to follow for continued education and decline - Please reconsult palliative with future admissions so we can continue to build rapport and educate family  Code Status/Advance Care Planning:  Full code   Symptom Management:   Edema: Improving. RUE continues to be swollen but studies negative for DVT/clot. Educated on keeping elevated.   Palliative Prophylaxis:   Bowel Regimen, Delirium Protocol, Frequent Pain Assessment and Turn Reposition  Additional Recommendations (Limitations, Scope, Preferences):  Full Scope Treatment  Psycho-social/Spiritual:   Desire for further Chaplaincy support:no  Additional Recommendations: Caregiving  Support/Resources  Prognosis:   Unable to determine  Discharge Planning: [place] for rehab with Palliative care service follow-up      Primary Diagnoses: Present on Admission: .  Sepsis ([place]) . GERD (gastroesophageal reflux disease) . Chronic diastolic CHF (congestive heart failure) ([place]) . Persistent atrial fibrillation ([place]) . Hypertension . Acute on chronic diastolic CHF (congestive heart failure) ([place]) . Acute on chronic respiratory failure with hypoxia ([place]) . Morbid obesity ([place]) . Influenza-like illness . Rash and nonspecific skin eruption . Chronic atrial fibrillation ([place]) . Anemia   I have reviewed the medical record, interviewed the patient and family, and examined the patient. The following aspects are pertinent.  Past Medical  History:  Diagnosis Date  . Chronic diastolic CHF (congestive heart failure) ([place])   . Coronary artery disease    s/p CABG  . Diabetes mellitus without complication ([place])   . Hyperlipidemia   . Hypertension   . Morbid obesity ([place])   . OSA (obstructive sleep apnea)    mild OSA with AHI 7.15 now on CPAP at 16cm H2O  . RBBB    Social History   Social History  . Marital status: Widowed    Spouse name: N/A  . Number of children: N/A  . Years of education: N/A   Social History Main Topics  . Smoking status: Former Research scientist (life sciences)  . Smokeless tobacco: Never Used  . Alcohol use No  . Drug use: No  . Sexual activity: No   Other Topics Concern  . None   Social History Narrative  . None   Family History  Problem Relation Age of Onset  . Heart attack Mother   . Heart disease Mother   . Stomach cancer Father    Scheduled Meds: . apixaban  5 mg Oral BID  . atorvastatin  10 mg Oral q1800  . buPROPion  150 mg Oral Daily  . feeding supplement (GLUCERNA SHAKE)  237 mL Oral TID BM  . feeding supplement (PRO-STAT SUGAR FREE 64)  30 mL Oral BID  . furosemide  80 mg Oral Daily  . gabapentin  600 mg Oral BID  . hydrALAZINE  25 mg Oral Q8H  . isosorbide mononitrate  30 mg Oral Daily  . metolazone  2.5 mg Oral Daily  . metoprolol succinate  50 mg Oral Daily  . multivitamin with minerals  1 tablet Oral Daily  . pantoprazole  40 mg Oral Daily  . polyethylene glycol  17 g Oral Daily  . senna  2 tablet Oral Daily  . sodium chloride flush  3 mL Intravenous Q12H  . traMADol  50 mg Oral Q12H   Continuous Infusions: PRN Meds:.acetaminophen **OR** acetaminophen, fluticasone, ipratropium-albuterol, ondansetron **OR** ondansetron (ZOFRAN) IV, sodium chloride flush No Known Allergies Review of Systems  Constitutional: Positive for activity change and fatigue.  Respiratory: Negative for shortness of breath.   Cardiovascular: Positive for leg swelling.  Neurological: Positive for weakness.     Physical Exam  Constitutional: She is oriented to person, place, and time. She appears well-developed and well-nourished.  HENT:  Head: Normocephalic and atraumatic.  Cardiovascular: Normal rate.  An irregularly irregular rhythm present.  Pulmonary/Chest: Effort normal. No accessory muscle usage. No tachypnea. No respiratory distress.  Abdominal: Soft. Normal appearance.  Neurological: She is alert and oriented to person, place, and time.  Seems very aware of her health and history.  Nursing note and vitals reviewed.   Vital Signs: BP (!) 134/96 (BP Location: Right Arm)   Pulse 75   Temp 98.8 F (37.1 C) (Oral)   Resp 20   Ht _0  (1.575 m)   Wt 110.2 kg (242 lb 14.4 oz)   SpO2  99%   BMI 44.43 kg/m  Pain Assessment: No/denies pain   Pain Score: 3    SpO2: SpO2: 99 % O2 Device:SpO2: 99 % O2 Flow Rate: .O2 Flow Rate (L/min): 2.5 L/min  IO: Intake/output summary:  Intake/Output Summary (Last 24 hours) at 06/13/16 0801 Last data filed at 06/13/16 0500  Gross per 24 hour  Intake             1366 ml  Output                4 ml  Net             1362 ml    LBM: Last BM Date: 06/11/16 Baseline Weight: Weight: 115.4 kg (254 lb 6.4 oz) Most recent weight: Weight: 110.2 kg (242 lb 14.4 oz)     Palliative Assessment/Data: PPS: 30%   Flowsheet Rows   Flowsheet Row Most Recent Value  Intake Tab  Referral Department  Hospitalist  Unit at Time of Referral  Cardiac/Telemetry Unit  Palliative Care Primary Diagnosis  Cardiac  Date Notified  06/09/16  Palliative Care Type  New Palliative care  Reason for referral  Clarify Goals of Care  Date of Admission  06/02/16  Date first seen by Palliative Care  06/12/16  # of days Palliative referral response time  3 Day(s)  # of days IP prior to Palliative referral  7  Clinical Assessment  Psychosocial & Spiritual Assessment  Palliative Care Outcomes      Time In: 1615 Time Out: 1725 Time Total: 59mn Greater than 50%  of  this time was spent counseling and coordinating care related to the above assessment and plan.  Signed by: AVinie Sill NP Palliative Medicine Team Pager # [phone](M-F 8a-5p) Team Phone # [phone](Nights/Weekends)

## 2016-06-13 NOTE — Progress Notes (Signed)
[facility] Room: 123  [name],[name], MD [address] [place] [id number]  This is a comprehensive admission note to [facility] performed on this date less than 30 days from date of admission. Included are preadmission medical/surgical history;reconciled medication list; family history; social history and comprehensive review of systems.  Corrections and additions to the records were documented . Comprehensive physical exam was also performed. Additionally a clinical summary was entered for each active diagnosis pertinent to this admission in the Problem List to enhance continuity of care.   HPI: The patient was hospitalized 11/26-12/7/17 with acute on chronic respiratory failure with hypoxia. She was admitted with somnolence and lethargy and increasing shortness of breath. At admission O2 sats were 84% on room air. Over  2-3 days prior to admission she had myalgias and arthralgias with fever. Temperature was 100.6, she exhibited tachycardia. In the ER  4 L of oxygen were required to maintain adequate saturation. Empirically the patient received Zosyn and vancomycin ;these were discontinued 11/28 as x-ray revealed no pneumonia and RSV panel was negative She was aggressively diuresed for decompensated congestive heart failure. This was associated with worsening renal function. Cardiology recommended nitroglycerin drip and IV diuresis. Acute encephalopathy was attributed to decompensated congestive heart failure with hypoxia and acute on chronic renal failure. B12 level was normal. Initially TSH was 7.34 but repeat value was 4.52. The creatinine peaked at 1.8. Follow-up was recommended as an outpatient. Patient was placed on CPAP for obstructive sleep apnea. Apparently she was not employing this prior to admission as she did not have a mask. Hemoglobin dropped as low as 7.3 without overt bleeding. She received 1 unit of red packed cells on  12/3. Hemoglobin improved to 8.9. Palliative care was consulted 12/3. Wrapping at discharge was 1.42 and GFR 40. Hematocrit was 31.7  Past medical and surgical history: Includes right bundle branch block, obstructive sleep apnea, morbid obesity, hypertension, dyslipidemia,coronary artery disease, and chronic diastolic congestive heart failure. She has had coronary artery bypass grafting and coronary angioplasty with stent placement. Additionally she's had cardioversion. She's had orthopedic procedures on the ankle, shoulder, knee, and back  Social history: Reviewed  Family history: Reviewed  Extensive review of systems was completed. She admits that she was not using her CPAP prior to admission. She validates that she's been told she has sleep apnea.  Her only complaint at this time is left lateral abdominal pain which is radicular nature when she tries to sit up. At rest she has no pain. She has no associated neuromuscular signs or symptoms. She has tramadol ordered 50 mg every 12 hours as needed   Constitutional: No fever,significant weight change, fatigue  Eyes: No redness, discharge, pain, vision change ENT/mouth: No nasal congestion,  purulent discharge, earache,change in hearing ,sore throat  Cardiovascular: No chest pain, palpitations,paroxysmal nocturnal dyspnea, claudication, edema  Respiratory: No cough, sputum production,hemoptysis, DOE , significant snoring,apnea   Gastrointestinal: No heartburn,dysphagia, nausea / vomiting,rectal bleeding, melena,change in bowels Genitourinary: No dysuria,hematuria, pyuria,  incontinence, nocturia Musculoskeletal: No joint stiffness, joint swelling, weakness,pain Dermatologic: No rash, pruritus, change in appearance of skin Neurologic: No dizziness,headache,syncope, seizures, numbness , tingling Psychiatric: No significant anxiety , depression, insomnia, anorexia Endocrine: No change in hair/skin/ nails, excessive thirst, excessive hunger,  excessive urination  Hematologic/lymphatic: No significant bruising, lymphadenopathy,abnormal bleeding Allergy/immunology: No itchy/ watery eyes, significant sneezing, urticaria, angioedema  Physical exam:  Pertinent or positive findings:She appeared to resent that she had to be interviewed  and examined. She had a scowl on her face until the very end of the interview when I got her to smile. Answers  to the detailed review of systems were a terse "no".   she is morbidly obese. The lacrimal glands and upper lids are very prominent, but  angioedema is not suggested. The maxilla is edentulous. She has a few remaining lower teeth. Breath sounds are distant. Heart sounds are also slightly distant. There is slight irregularity to the rhythm. Abdomen is massive. She described tenderness as I palpated her ankles for edema. Right leg raising was negative on the left.  Deep  tendon reflexes were 0-1/2+.  General appearance:Adequately nourished; no acute distress , increased work of breathing is present.   Lymphatic: No lymphadenopathy about the head, neck, axilla . Eyes: No conjunctival inflammation or lid edema is present. There is no scleral icterus. Ears:  External ear exam shows no significant lesions or deformities.   Nose:  External nasal examination shows no deformity or inflammation. Nasal mucosa are pink and moist without lesions ,exudates Oral exam: lips and gums are healthy appearing.There is no oropharyngeal erythema or exudate . Neck:  No thyromegaly, masses, tenderness noted.    Heart:  No gallop, murmur, click, rub .  Lungs: without wheezes, rhonchi,rales , rubs. Abdomen:Bowel sounds are normal. Abdomen is soft and nontender with no organomegaly, hernias,masses. GU: deferred . Extremities:  No cyanosis, clubbing,edema  Neurologic exam : Strength equal  in upper & lower extremities Skin: Warm & dry w/o tenting. No significant lesions or rash.  See clinical summary under each active  problem in the Problem List with associated updated therapeutic plan

## 2016-06-13 NOTE — Discharge Summary (Signed)
Physician Discharge Summary  Lisa [name]:[id number] DOB: [birth year] DOA: 06/02/2016  PCP: Laurena Slimmer, MD  Admit date: 06/02/2016 Discharge date: 06/13/2016  Time spent: 40 minutes  Recommendations for Outpatient Follow-up:  1. Needs BMet and adjustment of LAsix and metolazone [new med] in about 1 week 2. Daily weight 3. Recommend 1500cc fluid restriction 4. Should have palliative care follow as an OP as well 5. Needs thyroid function tests ~ 1 week 6. continue oxygen at facility 2 liters at rest  Discharge Diagnoses:  Principal Problem:   Acute on chronic respiratory failure with hypoxia (HCC) Active Problems:   GERD (gastroesophageal reflux disease)   Hypertension   Morbid obesity (HCC)   Chronic diastolic CHF (congestive heart failure) (HCC)   Persistent atrial fibrillation (HCC)   Sepsis (HCC)   Acute on chronic diastolic CHF (congestive heart failure) (HCC)   Influenza-like illness   Rash and nonspecific skin eruption   Pressure injury of skin   Chronic atrial fibrillation (HCC)   Chronic anticoagulation   Anemia   Acute on chronic renal insufficiency   Goals of care, counseling/discussion   DNR (do not resuscitate) discussion   Palliative care encounter   Discharge Condition: nee  Diet recommendation: hh low salt and 1500 cc limit to fluids  Filed Weights   06/11/16 0709 06/12/16 0536 06/13/16 0621  Weight: 108.5 kg (239 lb 3.2 oz) 108.5 kg (239 lb 4.8 oz) 110.2 kg (242 lb 14.4 oz)    History of present illness:  37 ? history of hypertension,  hyperlipidemia,  diastolic CHF,              Recent echo 05/05/16=EF 50-55% grd 1 DDD, PASP 53 mm atrial fibrillation,  coronary artery disease status post CABG x 3 2008  diabetes mellitus type 2 with neuropathy  morbid obesity,  chronic respiratory failure on 2 L  Renal cysts on [place] 05/2016 OSA not on cpap as has no mask  Recent admit 10/27--11/1 with atypical cp presentation  Admitted 11/27  somnolence, lethargy, and increasing shortness of breath.   Upon arrival, O2 saturations were 84% on room air.  had myalgias and arthralgias for the past 2-3days prior to this admission.   temperature 100.50F with heart rate 105 requiring up to 4 L nasal cannula to maintain adequate oxygen saturation. Initially thought pulmonary infection; therefore, the patient was started on intravenous Zosyn and vancomycin.  As the patient improved clinically and remained afebrile, antibiotics were discontinued on 06/04/2016.  Diuresed for decompensated CHF. Kidney functions worsened.  Cardiology was consulted and patient was placed on NTG drip with IV diuresis.  Renal function slowly improving.  palliative care consultation for goals of care on 12/3   Hospital Course:  Acute on chronic respiratory failure with hypoxia - Secondary to decompensated CHF. At baseline, patient is on 2 L nasal cannula.  -Wean oxygen as tolerated back to baseline.  -Flu panel PCR and RSV panel negative.  Acute on chronic diastolic CHF -During her office visit 05/03/2016, the patient's weight was 232 pounds. Admission weight 254 pounds. Suspect inaccurate daily weights and intake output in the hospital.  - She was initially treated with IV Lasix which was transitioned to oral after clinical improvement. Creatinine gradually increased from 1.47 on 11/27 to 1.83 on 11/30. She remained volume overloaded.  -Accept higher creatinine related to diuresis. Cardiology started NTG infusion -->nitrodur - As per cardiology follow-up 12/4, changing Lasix to 40 mg IV to 80 po od , metolazone 2.5  Fever -sepsis ruled out. Flu panel PCR, RSV panel, UA and chest x-ray negative for features of infection.  - No further fevers.   ParoxysmalAtrial fibrillation -Continue apixaban and metoprolol succinate. CHADSVASc = 6(CHF, HTN, Age, CAD, Female) -Ratecontrolled  Acute Encephalopathy - multifactorial including decompensated CHF, hypoxia  and acute on chronic renal failure - B12--406 -TSH--7.344--> repeated TSH: 4.52 and free T4: 1.44. Clinically euthyroid. Recommend repeating full TFTs in 4-6 weeks. Ammonia--22 - Acute mental status changes resolved.   Acute on chronic renal failure--CKD stage III -Baseline creatinine 1.1-1.4 - Creatinine peaked to 1.8. Creatinine has improved to 1.5 on 12/4 .  -We will need outpatient creatinine in about one week  Hypertension -Mildly uncontrolled. Continue metoprolol succinate. Amlodipine was discontinued earlier due to soft blood pressures and may need to be resumed. Nitrates as above. Monitor  Foot rash -This appears to be ecchymosis on examination--nonblanching -Suspect the patient had recent trauma in the setting of apixiban use -No signs of infection  OSA  -- the patient is tolerating CPAP therapy well without any problems at home.   Hyperlipidemia -continue statin  Hypomagnesemia - Replaced.  Anemia - No bleeding reported. Hemoglobin gradually dropping 9.5 > 7.9 >7.9 >7.4 >7.3. No overt bleeding reported. S/p 1 unit of PRBC on 12/3. Hemoglobin improved to 8.9. - FOBT has been requested couple of days back but not sent, discussed with nursing.  -Worsening anemia likely secondary to acute illness, chronic disease and phlebotomies in the hospital.   Coagulase negative blood culture in one of 2 bottles - Likely contaminant. No intervention.  Hypokalemia - Replaced.  Right upper extremity swelling - Patient states that she has had this for approximately 2 weeks. Denies pain. May be asymmetrical edema . Venous Doppler negative for DVT. Elevated extremity. Good peripheral pulsations. Improving.  Adult failure to thrive - Multifactorial secondary to advanced age, possible dementia and multiple severe comorbidities and acute illness. Discussed in detail with patient's daughter from [place] on 12/3 and she was agreeable to palliative care consultation-requested  12/3 and was completed on 06/12/2016 Patient wiashes FULL CODE STATUS    Consultants:  cardiology  Code Status: FULL  DVT Prophylaxis: apixaban   Procedures: As Listed in Progress Note Above  Antibiotics:  None  Discharge Exam: Vitals:   06/12/16 2034 06/13/16 0621  BP: (!) 144/62 (!) 134/96  Pulse: 76 75  Resp: 20 20  Temp: 98 F (36.7 C) 98.8 F (37.1 C)    General: alert pleasant oriented in nad, no jvf, no ict no pallor Cardiovascular: s1 s 2no m/r/g no displaced pmi Respiratory: clear to bases abd soft nt nd no rebound no guard No le edema   Discharge Instructions   Discharge Instructions    AMB Referral to [facility] Care Management    Complete by:  As directed    Please assign patient to social worker for post hospital follow up at skilled facility.  CSW please request for community nurse when patient is ready for transition to home.    Questions please call:   Charlesetta Shanks, RN BSN CCM [facility]  [phone] business mobile phone Toll free office [phone]   Reason for consult:  HF follow up, lives alone, support follow up   Diagnoses of:  Heart Failure   Expected date of contact:  1-3 days (reserved for hospital discharges)   Diet - low sodium heart healthy    Complete by:  As directed    Increase activity slowly  Complete by:  As directed      Current Discharge Medication List    START taking these medications   Details  hydrALAZINE (APRESOLINE) 25 MG tablet Take 1 tablet (25 mg total) by mouth every 8 (eight) hours. Qty: 90 tablet, Refills: 0    isosorbide mononitrate (IMDUR) 30 MG 24 hr tablet Take 1 tablet (30 mg total) by mouth daily. Qty: 30 tablet, Refills: 0    metolazone (ZAROXOLYN) 2.5 MG tablet Take 1 tablet (2.5 mg total) by mouth daily. Qty: 20 tablet, Refills: 0      CONTINUE these medications which have CHANGED   Details  furosemide (LASIX) 80 MG tablet Take 1 tablet (80 mg total) by  mouth daily. Qty: 30 tablet, Refills: 0    metoprolol succinate (TOPROL-XL) 50 MG 24 hr tablet Take 1 tablet (50 mg total) by mouth daily. Take with or immediately following a meal. Qty: 30 tablet, Refills: 0      CONTINUE these medications which have NOT CHANGED   Details  amLODipine (NORVASC) 5 MG tablet Take 1 tablet (5 mg total) by mouth daily. Qty: 30 tablet, Refills: 11    apixaban (ELIQUIS) 5 MG TABS tablet Take 1 tablet (5 mg total) by mouth 2 (two) times daily. Qty: 180 tablet, Refills: 3    atorvastatin (LIPITOR) 10 MG tablet Take 1 tablet (10 mg total) by mouth daily at 6 PM. Qty: 30 tablet, Refills: 0    buPROPion (WELLBUTRIN XL) 150 MG 24 hr tablet Take 150 mg by mouth daily. Reported on 08/02/2015    fluticasone (FLOVENT HFA) 220 MCG/ACT inhaler Inhale 2 puffs into the lungs 2 (two) times daily as needed (shortness of breath/ wheezing).    gabapentin (NEURONTIN) 600 MG tablet Take 1 tablet (600 mg total) by mouth 2 (two) times daily.    ipratropium-albuterol (DUONEB) 0.5-2.5 (3) MG/3ML SOLN Take 3 mLs by nebulization every 6 (six) hours as needed. Wheezing or shortness of breath.    OXYGEN Inhale 2 L into the lungs continuous.    traMADol (ULTRAM) 50 MG tablet Take 50 mg by mouth every 12 (twelve) hours.    esomeprazole (NEXIUM) 40 MG capsule Take 1 capsule (40 mg total) by mouth daily before breakfast. Qty: 30 capsule, Refills: 11   Associated Diagnoses: Coronary artery disease involving native coronary artery of native heart without angina pectoris      STOP taking these medications     etodolac (LODINE) 400 MG tablet      omeprazole (PRILOSEC) 20 MG capsule        No Known Allergies Contact information for after-discharge care    Destination    [facility] .   Specialty:  Skilled Nursing Facility Contact information: [address] [place] [id number] [phone]               The results of significant  diagnostics from this hospitalization (including imaging, microbiology, ancillary and laboratory) are listed below for reference.    Significant Diagnostic Studies: Dg Chest 2 View  Result Date: 06/02/2016 CLINICAL DATA:  Trouble breathing today, weakness EXAM: CHEST  2 VIEW COMPARISON:  05/05/2016 FINDINGS: Median sternotomy wires are again evident. Discoid atelectasis in the right mid lung zone. Linear atelectasis in the left upper lobe. Low lung volumes. Suspect tiny left pleural effusion. Mild to moderate cardiomegaly with mild central congestion. No pneumothorax. Partially visualized spinal hardware. IMPRESSION: 1. Low lung volumes. 2. Linear atelectasis in the right lower lung zone. Linear  atelectasis in the left upper lobe. 3. Stable cardiomegaly. Mild central vascular congestion. Suspect tiny left effusion. Electronically Signed   By: Jasmine PangKim  Fujinaga M.D.   On: 06/02/2016 23:42   Dg Chest Port 1 View  Result Date: 06/07/2016 CLINICAL DATA:  CHF short of breath EXAM: PORTABLE CHEST 1 VIEW COMPARISON:  06/04/2016 FINDINGS: Hypoventilation. Decreased lung volume with mild bibasilar atelectasis Cardiac enlargement without heart failure. CABG changes. Negative for pneumonia or effusion IMPRESSION: Hypoventilation with bibasilar atelectasis. Cardiac enlargement without heart failure. Electronically Signed   By: Marlan Palauharles  Clark M.D.   On: 06/07/2016 08:21    Microbiology: Recent Results (from the past 240 hour(s))  Respiratory Panel by PCR     Status: None   Collection Time: 06/03/16  9:43 AM  Result Value Ref Range Status   Adenovirus NOT DETECTED NOT DETECTED Final   Coronavirus 229E NOT DETECTED NOT DETECTED Final   Coronavirus HKU1 NOT DETECTED NOT DETECTED Final   Coronavirus NL63 NOT DETECTED NOT DETECTED Final   Coronavirus OC43 NOT DETECTED NOT DETECTED Final   Metapneumovirus NOT DETECTED NOT DETECTED Final   Rhinovirus / Enterovirus NOT DETECTED NOT DETECTED Final   Influenza A NOT  DETECTED NOT DETECTED Final   Influenza B NOT DETECTED NOT DETECTED Final   Parainfluenza Virus 1 NOT DETECTED NOT DETECTED Final   Parainfluenza Virus 2 NOT DETECTED NOT DETECTED Final   Parainfluenza Virus 3 NOT DETECTED NOT DETECTED Final   Parainfluenza Virus 4 NOT DETECTED NOT DETECTED Final   Respiratory Syncytial Virus NOT DETECTED NOT DETECTED Final   Bordetella pertussis NOT DETECTED NOT DETECTED Final   Chlamydophila pneumoniae NOT DETECTED NOT DETECTED Final   Mycoplasma pneumoniae NOT DETECTED NOT DETECTED Final     Labs: Basic Metabolic Panel:  Recent Labs Lab 06/08/16 0409 06/09/16 0443 06/10/16 0330 06/11/16 0305 06/12/16 1008  NA 132* 131* 132* 133* 132*  K 3.7 3.6 3.5 3.5 3.5  CL 96* 94* 95* 91* 87*  CO2 27 27 27  35* 33*  GLUCOSE 92 139* 100* 100* 93  BUN 33* 35* 34* 29* 35*  CREATININE 1.70* 1.71* 1.53* 1.31* 1.42*  CALCIUM 8.7* 8.5* 8.9 9.3 9.0   Liver Function Tests: No results for input(s): AST, ALT, ALKPHOS, BILITOT, PROT, ALBUMIN in the last 168 hours. No results for input(s): LIPASE, AMYLASE in the last 168 hours. No results for input(s): AMMONIA in the last 168 hours. CBC:  Recent Labs Lab 06/07/16 0412 06/08/16 0409 06/09/16 0443 06/10/16 0330 06/11/16 0305  WBC 10.6* 9.0 9.5 9.7 8.3  HGB 7.9* 7.4* 7.3* 8.9* 10.4*  HCT 24.0* 22.4* 22.5* 26.7* 31.7*  MCV 94.5 93.7 94.5 89.9 90.1  PLT 186 197 226 235 232   Cardiac Enzymes: No results for input(s): CKTOTAL, CKMB, CKMBINDEX, TROPONINI in the last 168 hours. BNP: BNP (last 3 results)  Recent Labs  05/03/16 1859 06/02/16 1914  BNP 257.2* 757.9*    ProBNP (last 3 results) No results for input(s): PROBNP in the last 8760 hours.  CBG: No results for input(s): GLUCAP in the last 168 hours.     SignedRhetta Mura:  Taleah Bellantoni, JAI-GURMUKH MD   [facility] 06/13/2016, 9:42 AM

## 2016-06-13 NOTE — Assessment & Plan Note (Signed)
Creatinine has dropped from 1.8 to a value of 1.42. BMET will be monitored. 

## 2016-06-13 NOTE — Patient Instructions (Signed)
See Current Assessment & Plan in Problem List under specific Diagnosis 

## 2016-06-13 NOTE — Telephone Encounter (Signed)
New Message  TCM appt with Robbie LisBrittainy Simmons on 12.14.17 @ 930 am, [place].

## 2016-06-13 NOTE — Clinical Social Work Note (Cosign Needed)
CSW facilitated patient discharge including contacting patient family and facility to confirm patient discharge plans. Clinical information faxed to facility and family agreeable with plan. CSW arranged [facility]30 [facility]. RN to call report prior to discharge [phone]).  CSW will sign off for now as social work intervention is no longer needed. Please consult us again if new needs arise.  Charlynn CourtSarah Yamilet Mcfayden, CSW [phone]

## 2016-06-13 NOTE — Assessment & Plan Note (Signed)
Need to employ the CPAP to prevent increased intrathoracic pressure ; potentially health threatening cardiac irregularities; and heart failure discussed.

## 2016-06-14 NOTE — Telephone Encounter (Signed)
[facility]   Spoke with nurse, Courtney Parisenata, she took the appointment info for 12/14 with Brittainy at 9:30 and asked me to fax the hospital discharge instructions to them.  Faxed through EPIC to [place] at [phone].

## 2016-06-17 ENCOUNTER — Other Ambulatory Visit: Payer: Self-pay | Admitting: *Deleted

## 2016-06-17 ENCOUNTER — Telehealth: Payer: Self-pay | Admitting: *Deleted

## 2016-06-17 NOTE — Telephone Encounter (Signed)
Pt. Daughter called because she is in still in the hospital her authorization expires on Dec 19 to [employer].  I told the daughter the pt would need a new auth to come in after that date. What should I do at this point?

## 2016-06-18 ENCOUNTER — Other Ambulatory Visit: Payer: Self-pay | Admitting: *Deleted

## 2016-06-18 ENCOUNTER — Encounter: Payer: Self-pay | Admitting: *Deleted

## 2016-06-18 NOTE — Patient Outreach (Signed)
[facility] Care Management  [facility]  06/18/2016  [name] Ruest Sep 03, 1940 [id number]  Subjective:  Patient states that she has been in the SNF for approximately 7 days.  Patient stated that she in pain and felt as if she had a urinary tract infection.  Per patient, she has notified her nurse.  Patient's daughter's at bedside and were very concerned about patient and the possibility of a urinary  tract infection.     Objective:   Encounter Medications:  Outpatient Encounter Prescriptions as of 06/17/2016  Medication Sig  . amLODipine (NORVASC) 5 MG tablet Take 1 tablet (5 mg total) by mouth daily.  Marland Kitchen. apixaban (ELIQUIS) 5 MG TABS tablet Take 1 tablet (5 mg total) by mouth 2 (two) times daily.  Marland Kitchen. atorvastatin (LIPITOR) 10 MG tablet Take 1 tablet (10 mg total) by mouth daily at 6 PM.  . buPROPion (WELLBUTRIN XL) 150 MG 24 hr tablet Take 150 mg by mouth daily. Reported on 08/02/2015  . esomeprazole (NEXIUM) 40 MG capsule Take 1 capsule (40 mg total) by mouth daily before breakfast.  . fluticasone (FLOVENT HFA) 220 MCG/ACT inhaler Inhale 2 puffs into the lungs 2 (two) times daily as needed (shortness of breath/ wheezing).  . furosemide (LASIX) 80 MG tablet Take 1 tablet (80 mg total) by mouth daily.  Marland Kitchen. gabapentin (NEURONTIN) 600 MG tablet Take 1 tablet (600 mg total) by mouth 2 (two) times daily.  . hydrALAZINE (APRESOLINE) 25 MG tablet Take 1 tablet (25 mg total) by mouth every 8 (eight) hours.  Marland Kitchen. ipratropium-albuterol (DUONEB) 0.5-2.5 (3) MG/3ML SOLN Take 3 mLs by nebulization every 6 (six) hours as needed. Wheezing or shortness of breath. (Patient taking differently: Take 3 mLs by nebulization every 6 (six) hours as needed (shortness of breath/ wheezing). )  . isosorbide mononitrate (IMDUR) 30 MG 24 hr tablet Take 1 tablet (30 mg total) by mouth daily.  . metolazone (ZAROXOLYN) 2.5 MG tablet Take 1 tablet (2.5 mg total) by mouth daily.  . metoprolol succinate  (TOPROL-XL) 50 MG 24 hr tablet Take 1 tablet (50 mg total) by mouth daily. Take with or immediately following a meal.  . OXYGEN Inhale 2 L into the lungs continuous.  . traMADol (ULTRAM) 50 MG tablet Take 50 mg by mouth every 12 (twelve) hours.   No facility-administered encounter medications on file as of 06/17/2016.     Functional Status:  In your present state of health, do you have any difficulty performing the following activities: 06/17/2016 06/03/2016  Hearing? N -  Vision? N -  Difficulty concentrating or making decisions? N -  Walking or climbing stairs? Y -  Dressing or bathing? Y Y  Doing errands, shopping? Y -  Quarry managerreparing Food and eating ? Y -  Using the Toilet? Y -  In the past six months, have you accidently leaked urine? Y -  Do you have problems with loss of bowel control? Y -  Managing your Medications? N -  Managing your Finances? N -  Housekeeping or managing your Housekeeping? N -  Some recent data might be hidden    Fall/Depression Screening:  PHQ 2/9 Scores 06/17/2016 10/26/2015  PHQ - 2 Score 1 6  PHQ- 9 Score - 16    Assessment: Patient was in the bed with this social worker arrived, visibly experiencing discomfort. Patient complained of stomach pain throughout visit.  Patient's 2 daughters at bedside, very concerned and were very vocal about their frustrations regarding patient's  care in the SNF.   Patient's daughter states that patient lives alone, however will be moving this Saturday to a handicapped accessible apartment.  Family members will move her.   [facility]. She uses medicaid transportation to get to her medical appointments.  Per patient's daughter, patient may need a wheelchair ramp as there are 2 steps that patient will have to navigate when she returns home.   Plan: This social worker will continue to follow patient's progress in the skilled nursing facility.

## 2016-06-18 NOTE — Patient Outreach (Signed)
[facility]) Care Management  06/18/2016  [name] 02/27/1941 [id number]   Phone call from patient's daughter stating that she was not happy with the care patient was receiving [facility] and wanted to request a transfer. This Child psychotherapistsocial worker encouraged patient's daugther to discuss her concerns with the facilities Director of Nursing.   This social worker called patient's daughter back about 30 minutes later.  She stated that things were fine for now and that they were testing patient for a UTI. Once she receives the results, she will decide at that point if she will keep patient [facility].  This Child psychotherapistsocial worker will follow up within 1 week.   Adriana ReamsChrystal Mikkel Charrette, LCSW [facility] [phone]

## 2016-06-19 ENCOUNTER — Other Ambulatory Visit: Payer: Self-pay | Admitting: *Deleted

## 2016-06-19 NOTE — Patient Outreach (Signed)
[facility]) Care Management  06/19/2016  Lisa [name] Aug 30, 1940 [id number]   Phone call from patient's daughter on consent form.  Per patient's daughter, she has spoken to the [facility] who has listened to her concerns regarding patient's care.  Patient was able to provide a urine sample to test for a urinary tract infection-results pending. Per patient's daughter, they will keep patient as placed for one additional week.  If there are any further concerns they will pursue a transfer.   Plan:  This Child psychotherapistsocial worker will continue to follow patient while at [facility] to assist with discharge planning.   Adriana ReamsChrystal Takhia Spoon, LCSW [facility] [phone]

## 2016-06-20 ENCOUNTER — Other Ambulatory Visit: Payer: Self-pay

## 2016-06-20 ENCOUNTER — Ambulatory Visit (INDEPENDENT_AMBULATORY_CARE_PROVIDER_SITE_OTHER): Payer: Medicare Other | Admitting: Cardiology

## 2016-06-20 ENCOUNTER — Encounter: Payer: Self-pay | Admitting: Cardiology

## 2016-06-20 VITALS — BP 134/70 | HR 60 | Ht 64.0 in | Wt 228.0 lb

## 2016-06-20 DIAGNOSIS — Z5181 Encounter for therapeutic drug level monitoring: Secondary | ICD-10-CM | POA: Diagnosis not present

## 2016-06-20 DIAGNOSIS — D649 Anemia, unspecified: Secondary | ICD-10-CM

## 2016-06-20 DIAGNOSIS — R079 Chest pain, unspecified: Secondary | ICD-10-CM

## 2016-06-20 LAB — CBC
HEMATOCRIT: 28.4 % — AB (ref 35.0–45.0)
HEMOGLOBIN: 9.4 g/dL — AB (ref 11.7–15.5)
MCH: 30 pg (ref 27.0–33.0)
MCHC: 33.1 g/dL (ref 32.0–36.0)
MCV: 90.7 fL (ref 80.0–100.0)
MPV: 9.8 fL (ref 7.5–12.5)
Platelets: 299 10*3/uL (ref 140–400)
RBC: 3.13 MIL/uL — AB (ref 3.80–5.10)
RDW: 15.3 % — ABNORMAL HIGH (ref 11.0–15.0)
WBC: 11.8 10*3/uL — AB (ref 3.8–10.8)

## 2016-06-20 LAB — BASIC METABOLIC PANEL
BUN: 67 mg/dL — AB (ref 7–25)
CO2: 41 mmol/L — ABNORMAL HIGH (ref 20–31)
Calcium: 8.7 mg/dL (ref 8.6–10.4)
Creat: 2.28 mg/dL — ABNORMAL HIGH (ref 0.60–0.93)
Glucose, Bld: 153 mg/dL — ABNORMAL HIGH (ref 65–99)
POTASSIUM: 3.3 mmol/L — AB (ref 3.5–5.3)
SODIUM: 129 mmol/L — AB (ref 135–146)

## 2016-06-20 NOTE — Patient Instructions (Signed)
Medication Instructions:   Your physician recommends that you continue on your current medications as directed. Please refer to the Current Medication list given to you today.   If you need a refill on your cardiac medications before your next appointment, please call your pharmacy.  Labwork: BMET AND CBC   Testing/Procedures: NEEDS TO BE SCHEDULED PER TT. ORDERS IN EPIC Your physician has requested that you have a lexiscan myoview. For further information please visit [email or web address]. Please follow instruction sheet, as given.   Follow-Up: WITH DR TURNER IN 3 MONTHS    Any Other Special Instructions Will Be Listed Below (If Applicable).

## 2016-06-20 NOTE — Progress Notes (Signed)
06/20/2016 Lisa [name]   1941-04-29  [id number]  Primary Physician [name],[name], MD Primary Cardiologist: Dr. Mayford Knifeurner    Reason for Visit/CC: [facility] F/u for acute diastolic CHF  HPI:  Lisa [name] is a 75 y.o.morbidly obese AA female, SNF resident,with a history of CAD-s/p CABG, DM, CRI-3, HTN, OSA, CAF,-on Eliquis, dyslipidemia,andchronic diastolic CHF. She was just admitted 05/03/16-05/09/16 for chest pain and CHF. She is a poor historian. [facility] with suspected acute on chronic diastolic CHF, fever, increased SCr, and increased lethargy. Sepsis ruled out. Flu panel PCR, RSV panel, UA and chest x-ray negative for features of infection.   She was admitted for hypoxic respiratory failure in the setting of acute diastolic CHF. She was also anemic and required transfusion. Hgb dropped to 7.3. No overt bleeding reported. S/p 1 unit of PRBC on 12/3. Worsening anemia likely secondary to acute illness, chronic disease and phlebotomies in the hospital. Hgb on discharge was 10.4. Her CHF was treated with IV Lasix. She was later transitioned to PO 80 mg,  + metolazone 2.5 mg daily. Scr day of discharge was 1.42. Discharge weight was 239 lb.   Of note, she was also ordered by Dr. Mayford Knifeurner to get a NST in November. Patient has not yet completed and will need to schedule this in the near future.   She presents today from SNF with nursing assistant. She has done fairly well. No increased dyspnea. No weight gain. Weight today is 228 lb.  No edema. She has a low sodium diet ordered. I [facility]. She has been receiving her medications as prescribed. HR and BP both stable.     Current Meds  Medication Sig  . amLODipine (NORVASC) 5 MG tablet Take 1 tablet (5 mg total) by mouth daily.  Marland Kitchen. apixaban (ELIQUIS) 5 MG TABS tablet Take 1 tablet (5 mg total) by mouth 2 (two) times daily.  Marland Kitchen. atorvastatin (LIPITOR) 10 MG tablet Take 1 tablet (10 mg total) by  mouth daily at 6 PM.  . buPROPion (WELLBUTRIN XL) 150 MG 24 hr tablet Take 150 mg by mouth daily. Reported on 08/02/2015  . esomeprazole (NEXIUM) 40 MG capsule Take 1 capsule (40 mg total) by mouth daily before breakfast.  . fluticasone (FLOVENT HFA) 220 MCG/ACT inhaler Inhale 2 puffs into the lungs 2 (two) times daily as needed (shortness of breath/ wheezing).  . furosemide (LASIX) 80 MG tablet Take 1 tablet (80 mg total) by mouth daily.  Marland Kitchen. gabapentin (NEURONTIN) 600 MG tablet Take 1 tablet (600 mg total) by mouth 2 (two) times daily.  . hydrALAZINE (APRESOLINE) 25 MG tablet Take 1 tablet (25 mg total) by mouth every 8 (eight) hours.  Marland Kitchen. ipratropium-albuterol (DUONEB) 0.5-2.5 (3) MG/3ML SOLN Take 3 mLs by nebulization every 6 (six) hours as needed. Wheezing or shortness of breath. (Patient taking differently: Take 3 mLs by nebulization every 6 (six) hours as needed (shortness of breath/ wheezing). )  . isosorbide mononitrate (IMDUR) 30 MG 24 hr tablet Take 1 tablet (30 mg total) by mouth daily.  . metolazone (ZAROXOLYN) 2.5 MG tablet Take 1 tablet (2.5 mg total) by mouth daily.  . metoprolol succinate (TOPROL-XL) 50 MG 24 hr tablet Take 1 tablet (50 mg total) by mouth daily. Take with or immediately following a meal.  . OXYGEN Inhale 2 L into the lungs continuous.  . traMADol (ULTRAM) 50 MG tablet Take 50 mg by mouth every 12 (twelve) hours.   No  Known Allergies Past Medical History:  Diagnosis Date  . Chronic diastolic CHF (congestive heart failure) (HCC)   . Coronary artery disease    s/p CABG  . Diabetes mellitus without complication (HCC)   . Hyperlipidemia   . Hypertension   . Morbid obesity (HCC)   . OSA (obstructive sleep apnea)    mild OSA with AHI 7.15 now on CPAP at 16cm H2O  . RBBB    Family History  Problem Relation Age of Onset  . Heart attack Mother   . Heart disease Mother   . Stomach cancer Father    Past Surgical History:  Procedure Laterality Date  . BACK SURGERY     . CARDIOVERSION N/A 08/03/2015   Procedure: CARDIOVERSION;  Surgeon: Vesta Mixer, MD;  Location: [facility];  Service: Cardiovascular;  Laterality: N/A;  . CORONARY ANGIOPLASTY WITH STENT PLACEMENT    . CORONARY ARTERY BYPASS GRAFT    . KNEE SURGERY Bilateral   . ORIF ANKLE FRACTURE Right 08/19/2014   Procedure: OPEN REDUCTION INTERNAL FIXATION (ORIF) ANKLE FRACTURE;  Surgeon: Cammy Copa, MD;  Location: [facility] OR;  Service: Orthopedics;  Laterality: Right;  . rotator cuff surgery     Social History   Social History  . Marital status: Widowed    Spouse name: N/A  . Number of children: N/A  . Years of education: N/A   Occupational History  . Not on file.   Social History Main Topics  . Smoking status: Former Games developer  . Smokeless tobacco: Never Used  . Alcohol use No  . Drug use: No  . Sexual activity: No   Other Topics Concern  . Not on file   Social History Narrative  . No narrative on file     Review of Systems: General: negative for chills, fever, night sweats or weight changes.  Cardiovascular: negative for chest pain, dyspnea on exertion, edema, orthopnea, palpitations, paroxysmal nocturnal dyspnea or shortness of breath Dermatological: negative for rash Respiratory: negative for cough or wheezing Urologic: negative for hematuria Abdominal: negative for nausea, vomiting, diarrhea, bright red blood per rectum, melena, or hematemesis Neurologic: negative for visual changes, syncope, or dizziness All other systems reviewed and are otherwise negative except as noted above.   Physical Exam:  Blood pressure 134/70, pulse 60, height 5\' 4"  (1.626 m), weight 228 lb (103.4 kg).  General appearance: alert, cooperative and no distress Neck: no carotid bruit and no JVD Lungs: clear to auscultation bilaterally Heart: regular rate and rhythm, S1, S2 normal, no murmur, click, rub or gallop Extremities: extremities normal, atraumatic, no cyanosis or edema Pulses: 2+  and symmetric Skin: Skin color, texture, turgor normal. No rashes or lesions Neurologic: Grossly normal  EKG Not performed   ASSESSMENT AND PLAN:   1. Chronic Diastolic HF: euvolemic on exam. No weight gain since discharge. Continue lasix with metolazone. We will check a BMP today to reassess renal function and K. Continue daily weights and low sodium diet.   2. Chronic Renal Insufficiency: check BMP today given increase in diuretics.   3. HTN: BP is controlled on current regimen.   3. Atrial Fibrillation: HR is controlled. Continue metoprolol and Eliqius. CBC today given recent anemia.   4. Chest Pain: currently CP free but had reports of chest pain several weeks ago and was ordered by Dr. Mayford Knife to get a 2 day NST. She has been instructed to schedule test prior to leaving the office today.   PLAN  F/u with Dr. Mayford Knife  in 3 months.   Robbie LisBrittainy Simmons PA-C 06/20/2016 10:58 AM

## 2016-06-20 NOTE — Telephone Encounter (Signed)
Lisa BirksLexiscan has been scheduled 12/22.

## 2016-06-21 ENCOUNTER — Other Ambulatory Visit: Payer: Self-pay | Admitting: *Deleted

## 2016-06-21 ENCOUNTER — Telehealth: Payer: Self-pay | Admitting: *Deleted

## 2016-06-21 NOTE — Telephone Encounter (Signed)
Spoke with Boyce MediciBrittany Simmons, PA-C re: pts lab results. She gave the following orders: D/C Metolazone Hold Lasix unless pt experiences sob or wt gain of 3lbs or more in a day  Repeat BMET 06/24/16. Called pt and pt daughter, both have been made aware, then advised me that [facility] is in [facility]. [facility] and spoke with Marcy Salvoaymond, LPN and verbally gave him orders and fax# to fax lab results.

## 2016-06-21 NOTE — Patient Outreach (Signed)
  [facility]) Care Management  [facility] Social [facility]  06/21/2016  Lisa [name] 08/31/1940 [id number]  Subjective:  Patient is a 75 year old female currently in re[facility].  Objective:   Encounter Medications:  Outpatient Encounter Prescriptions as of 06/21/2016  Medication Sig  . amLODipine (NORVASC) 5 MG tablet Take 1 tablet (5 mg total) by mouth daily.  Marland Kitchen. apixaban (ELIQUIS) 5 MG TABS tablet Take 1 tablet (5 mg total) by mouth 2 (two) times daily.  Marland Kitchen. atorvastatin (LIPITOR) 10 MG tablet Take 1 tablet (10 mg total) by mouth daily at 6 PM.  . buPROPion (WELLBUTRIN XL) 150 MG 24 hr tablet Take 150 mg by mouth daily. Reported on 08/02/2015  . esomeprazole (NEXIUM) 40 MG capsule Take 1 capsule (40 mg total) by mouth daily before breakfast.  . fluticasone (FLOVENT HFA) 220 MCG/ACT inhaler Inhale 2 puffs into the lungs 2 (two) times daily as needed (shortness of breath/ wheezing).  . furosemide (LASIX) 80 MG tablet Take 1 tablet (80 mg total) by mouth daily.  Marland Kitchen. gabapentin (NEURONTIN) 600 MG tablet Take 1 tablet (600 mg total) by mouth 2 (two) times daily.  . hydrALAZINE (APRESOLINE) 25 MG tablet Take 1 tablet (25 mg total) by mouth every 8 (eight) hours.  Marland Kitchen. ipratropium-albuterol (DUONEB) 0.5-2.5 (3) MG/3ML SOLN Take 3 mLs by nebulization every 6 (six) hours as needed. Wheezing or shortness of breath. (Patient taking differently: Take 3 mLs by nebulization every 6 (six) hours as needed (shortness of breath/ wheezing). )  . isosorbide mononitrate (IMDUR) 30 MG 24 hr tablet Take 1 tablet (30 mg total) by mouth daily.  . metoprolol succinate (TOPROL-XL) 50 MG 24 hr tablet Take 1 tablet (50 mg total) by mouth daily. Take with or immediately following a meal.  . OXYGEN Inhale 2 L into the lungs continuous.  . traMADol (ULTRAM) 50 MG tablet Take 50 mg by mouth every 12 (twelve) hours.   No facility-administered encounter medications on file as of  06/21/2016.     Functional Status:  In your present state of health, do you have any difficulty performing the following activities: 06/17/2016 06/03/2016  Hearing? N -  Vision? N -  Difficulty concentrating or making decisions? N -  Walking or climbing stairs? Y -  Dressing or bathing? Y Y  Doing errands, shopping? Y -  Quarry managerreparing Food and eating ? Y -  Using the Toilet? Y -  In the past six months, have you accidently leaked urine? Y -  Do you have problems with loss of bowel control? Y -  Managing your Medications? N -  Managing your Finances? N -  Housekeeping or managing your Housekeeping? N -  Some recent data might be hidden    Fall/Depression Screening:  PHQ 2/9 Scores 06/17/2016 10/26/2015  PHQ - 2 Score 1 6  PHQ- 9 Score - 16    Assessment:  This social worker went to visit patient while in rehab [facility].  However patient was sound asleep when this social worker arrived.  This social worker asked to speak to her bedside nurse however she was not available. This social worker attempted to speak with the social worker, she was not in the office.  Plan: This Child psychotherapistsocial worker will follow up with patient next week.  Message left with patient's daughter on [facility] consent form requesting a return call.   Adriana ReamsChrystal Ardian Haberland, LCSW [facility] Care Management [phone]

## 2016-06-21 NOTE — Patient Outreach (Signed)
[facility]) Care Management  06/21/2016  Lisa [name] 03/17/1941 [id number]   Return phone call from patient's daughter Elnita MaxwellCheryl, who states that they will be keeping [facility].  Per patient's daughter patient's health is very fragile at this time. As of today, she has not been eating.  Per patient's daughter,  patient negative for a UTI. Patient's doctor gave orders to  Discontinue Metolazone, and hold lasix.  Patient's daughter states that they are still in the process of moving patient to another apartment. [address] [place], [place]  [place] [phone].  There are 2 steps that patient would have to navigate to get in the apartment. Patient may need a ramp.    Plan:  This Child psychotherapistsocial worker will follow up with patient at [facility] next week.    Adriana ReamsChrystal Land, LCSW [facility] Care Management [phone]

## 2016-06-24 LAB — BASIC METABOLIC PANEL
BUN: 71 mg/dL — AB (ref 4–21)
CREATININE: 2.2 mg/dL — AB (ref 0.5–1.1)
GLUCOSE: 147 mg/dL
POTASSIUM: 3.1 mmol/L — AB (ref 3.4–5.3)
SODIUM: 129 mmol/L — AB (ref 137–147)

## 2016-06-25 ENCOUNTER — Other Ambulatory Visit: Payer: Self-pay | Admitting: *Deleted

## 2016-06-25 ENCOUNTER — Non-Acute Institutional Stay (SKILLED_NURSING_FACILITY): Payer: Medicare Other | Admitting: Internal Medicine

## 2016-06-25 ENCOUNTER — Encounter: Payer: Self-pay | Admitting: Internal Medicine

## 2016-06-25 DIAGNOSIS — R2689 Other abnormalities of gait and mobility: Secondary | ICD-10-CM | POA: Diagnosis not present

## 2016-06-25 DIAGNOSIS — R079 Chest pain, unspecified: Secondary | ICD-10-CM | POA: Diagnosis not present

## 2016-06-25 DIAGNOSIS — I5033 Acute on chronic diastolic (congestive) heart failure: Secondary | ICD-10-CM | POA: Diagnosis not present

## 2016-06-25 DIAGNOSIS — L98419 Non-pressure chronic ulcer of buttock with unspecified severity: Secondary | ICD-10-CM | POA: Diagnosis not present

## 2016-06-25 DIAGNOSIS — D649 Anemia, unspecified: Secondary | ICD-10-CM | POA: Diagnosis not present

## 2016-06-25 DIAGNOSIS — R531 Weakness: Secondary | ICD-10-CM | POA: Diagnosis not present

## 2016-06-25 DIAGNOSIS — Z23 Encounter for immunization: Secondary | ICD-10-CM | POA: Diagnosis not present

## 2016-06-25 DIAGNOSIS — L98499 Non-pressure chronic ulcer of skin of other sites with unspecified severity: Secondary | ICD-10-CM | POA: Diagnosis not present

## 2016-06-25 DIAGNOSIS — E875 Hyperkalemia: Secondary | ICD-10-CM | POA: Diagnosis not present

## 2016-06-25 DIAGNOSIS — G4733 Obstructive sleep apnea (adult) (pediatric): Secondary | ICD-10-CM | POA: Diagnosis not present

## 2016-06-25 DIAGNOSIS — I482 Chronic atrial fibrillation: Secondary | ICD-10-CM | POA: Diagnosis not present

## 2016-06-25 DIAGNOSIS — E876 Hypokalemia: Secondary | ICD-10-CM

## 2016-06-25 DIAGNOSIS — N183 Chronic kidney disease, stage 3 (moderate): Secondary | ICD-10-CM | POA: Diagnosis not present

## 2016-06-25 DIAGNOSIS — E119 Type 2 diabetes mellitus without complications: Secondary | ICD-10-CM | POA: Diagnosis not present

## 2016-06-25 DIAGNOSIS — I10 Essential (primary) hypertension: Secondary | ICD-10-CM | POA: Diagnosis not present

## 2016-06-25 DIAGNOSIS — J80 Acute respiratory distress syndrome: Secondary | ICD-10-CM | POA: Diagnosis not present

## 2016-06-25 DIAGNOSIS — M6281 Muscle weakness (generalized): Secondary | ICD-10-CM | POA: Diagnosis not present

## 2016-06-25 DIAGNOSIS — I25810 Atherosclerosis of coronary artery bypass graft(s) without angina pectoris: Secondary | ICD-10-CM | POA: Diagnosis not present

## 2016-06-25 DIAGNOSIS — K219 Gastro-esophageal reflux disease without esophagitis: Secondary | ICD-10-CM | POA: Diagnosis not present

## 2016-06-25 DIAGNOSIS — I2510 Atherosclerotic heart disease of native coronary artery without angina pectoris: Secondary | ICD-10-CM | POA: Diagnosis not present

## 2016-06-25 DIAGNOSIS — R627 Adult failure to thrive: Secondary | ICD-10-CM | POA: Diagnosis not present

## 2016-06-25 DIAGNOSIS — E0800 Diabetes mellitus due to underlying condition with hyperosmolarity without nonketotic hyperglycemic-hyperosmolar coma (NKHHC): Secondary | ICD-10-CM

## 2016-06-25 DIAGNOSIS — N289 Disorder of kidney and ureter, unspecified: Secondary | ICD-10-CM

## 2016-06-25 DIAGNOSIS — E785 Hyperlipidemia, unspecified: Secondary | ICD-10-CM | POA: Diagnosis not present

## 2016-06-25 DIAGNOSIS — J9620 Acute and chronic respiratory failure, unspecified whether with hypoxia or hypercapnia: Secondary | ICD-10-CM | POA: Diagnosis not present

## 2016-06-25 DIAGNOSIS — I509 Heart failure, unspecified: Secondary | ICD-10-CM | POA: Diagnosis not present

## 2016-06-25 DIAGNOSIS — I5032 Chronic diastolic (congestive) heart failure: Secondary | ICD-10-CM | POA: Diagnosis not present

## 2016-06-25 DIAGNOSIS — N189 Chronic kidney disease, unspecified: Secondary | ICD-10-CM

## 2016-06-25 DIAGNOSIS — I481 Persistent atrial fibrillation: Secondary | ICD-10-CM | POA: Diagnosis not present

## 2016-06-25 NOTE — Progress Notes (Signed)
The patient is being discharged [facility] on this date by Marga MelnickWilliam Hopper MD. she is transferring to another rehabilitation facility.  PCP: Margaretmary Bayleylark, Preston MD  The medical history in this facility was reviewed and summarized and medical problem list was updated. Time spent and note content is documented as follows.  Summary of [facility]: The patient had been hospitalized 11/26-12/7/17 with acute on chronic respiratory failure with hypoxia. Patient had increasing dyspnea. O2 sats were 84% on room air admission. In the 2-3 days prior to that admission she experienced myalgias, arthralgias, and fever. Empirically antibiotics were started but discontinued when the chest x-ray revealed no pneumonia and the RSV panel was negative. Aggressive diuresis for  severe congestive heart failure was pursued. This was associated with worsening renal function. Cardiology ordered NTG drip and IV diuresis. Acute encephalopathy was felt to be related to decompensated  heart failure with hypoxia and acute on chronic renal failure. Initial TSH was 7.34 but repeat value was 4.52. Patient was on CPAP while hospitalized for obstructive sleep apnea. She developed significant anemia with hemoglobin as low as 7.3. On 12/3 she received 1 unit of packed cells. Palliative care was consulted 12/3. Creatinine at discharge was 1.42  & GFR 40. Hgb was 10.4. She was transferred to the SNF for rehabilitation. At the facility urinalysis and culture were collected because of some vague abdominal symptoms. Urinalysis was negative for nitrite but did exhibit 2+ bacteria. Culture 12/15 revealed mixed colonies of Pseudomonas and Escherichia coli, both less than 75,000 colonies. As per  Standard criteria  for urinary tract infection as greater than 100,000 colonies of one organism, no antibiotics were prescribed.  Cardiology saw the patient in follow-up 12/14. Weight was stable and she  exhibited no edema. Lasix with metolazone was continued daily. BMP was requested. Blood pressure was felt to be well controlled.   SNF records the patient had actually lost 19.2 pounds over a five-day period  Metolazone had been discontinued  12/15 because of a creatinine of 2.28 on 12/14.. Lasix was also held 12/16 and 12/17.  Repeat testing 12/18  revealed a 2.16, BUN  70.9, sodium 129, and potassium of 3.1. Chloride was 78. She received normal saline 1 L at 50 mL per hour. As per Epic labs she has had a persistent hyponatremia with sodium varying from 129 on 1214 to high of 133 on 12.5. Creatinine has increased significantly from a value 1.42 on 12/6 to a value of 2.16. This is stable. Lasix 80 mg daily was restarted.  Review of systems:Pertinent or active symptoms include: Wounds are described as improved as per Wound Care Nurse. There is a new right upper thigh new denudation related to transfers; no purulence was reported. She describes dry mouth and being excessively thirsty. She denies any active cardiopulmonary symptoms despite her history.  Negative WUJ:[id number]:Constitutional: No fever,significant weight change, fatigue  Eyes: No redness, discharge, pain, vision change ENT/mouth: No nasal congestion,  purulent discharge, earache,change in hearing ,sore throat  Cardiovascular: No chest pain, palpitations,paroxysmal nocturnal dyspnea, claudication, edema  Respiratory: No cough, sputum production,hemoptysis, DOE , significant snoring,apnea  Gastrointestinal: No heartburn,dysphagia,abdominal pain, nausea / vomiting,rectal bleeding, melena,change in bowels Genitourinary: No dysuria,hematuria, pyuria,  incontinence, nocturia Musculoskeletal: No joint stiffness, joint swelling, weakness,pain Dermatologic: No rash, pruritus, change in appearance of skin Neurologic: No dizziness,headache,syncope, seizures, numbness , tingling Psychiatric: No significant anxiety , depression, insomnia,  anorexia Endocrine: No change in hair/skin/ nails,  excessive hunger, excessive urination  Hematologic/lymphatic: No significant bruising, lymphadenopathy,abnormal bleeding Allergy/immunology: No itchy/ watery eyes, significant sneezing, urticaria, angioedema  Physical exam:  Pertinent or positive findings:She is alert and interactive. She did give the date initially as 06/19/2006 but quickly corrected herself. She gave " Harrold Donathrump" as  name of the president, but asked if this were incorrect.  The maxilla is edentulous. She has a few lower mandibular teeth. There is respiratory variation to her heart rhythm. Breath sounds are decreased, she has minor rales in scattered distribution.   abdomen is protuberant. Pedal pulses are decreased.  She has valgus deformities of the lower extremities with fusiform changes the knees.   General appearance:Adequately nourished; no acute distress , increased work of breathing is present.   Lymphatic: No lymphadenopathy about the head, neck, axilla . Eyes: No conjunctival inflammation or lid edema is present. There is no scleral icterus. Ears:  External ear exam shows no significant lesions or deformities.   Nose:  External nasal examination shows no deformity or inflammation. Nasal mucosa are pink and moist without lesions ,exudates Oral exam: lips and gums are healthy appearing.There is no oropharyngeal erythema or exudate . Neck:  No thyromegaly, masses, tenderness noted.    Heart:  No gallop, murmur, click, rub .  Abdomen:Bowel sounds are normal. Abdomen is soft and nontender with no organomegaly, hernias,masses. GU: deferred  Extremities:  No cyanosis, clubbing,edema  Neurologic exam : Balance,Rhomberg,finger to nose testing could not be completed due to clinical state Skin: Warm & dry w/o tenting. No significant rash.  See clinical summary of Discharge Diagnoses in the Problem List with associated updated therapeutic plan . Transfer will be arranged to  the new facility

## 2016-06-25 NOTE — Patient Outreach (Signed)
[facility]) Care Management  06/25/2016  Lisa [name] August 13, 1940 [id number]   Phone call from patient's daughter Lisa [name], who stated that she has spoken to several administrators at [facility] regarding patient's care and is now somewhat satisfied regarding the quality of care received. Per patient's daughter, she is not sure if patient wants to be transferred to another facility. Per Lisa Maxwellheryl, she will continue to monitor patient's progress daily and will continue to follow up with the administrators of the facility.   Plan:  This social worker will continue to follow patient while in the skilled nursing facility.   Lisa ReamsChrystal Miku Udall, LCSW [facility] Care Management [phone]

## 2016-06-25 NOTE — Patient Outreach (Signed)
[facility]) Care Management Post-Acute Care Coordination  06/25/2016  Lisa [name] 1940-11-25 [id number]   Met with Cameron Ali, LCSW for [facility]. Reviewed patient case. She reports that patient is transferring to another [facility] at family request.   RNCM will let [facility] community LCSW know of transfer to another facility, RNCM will sign off case.  Royetta Crochet. Laymond Purser, RN, BSN, [place] Post-Acute Care Coordinator [phone]

## 2016-06-25 NOTE — Assessment & Plan Note (Addendum)
On Lasix 80 mg and metolazone 2.5 mg daily weight has been stable and edema controlled, but hyponatremia has progressed and creatinine risen . Metolazone discontinued 12/15.Lasix was held 12/16 and 12/17. Lasix has been restarted. It is recommended that the dose be changed to 40 mg in the morning and 20 mg in the evening with monitor of the renal function and electrolytes.  Metolazone 2.5 mg would be recommended every third day until she is reevaluated by Dr. Mayford Knifeurner, Cardiology

## 2016-06-25 NOTE — Patient Outreach (Signed)
[facility]) Care Management  06/25/2016  Lisa [name] May 15, 1941 [id number]   Phone call from patient's daughter Lisa [name] informing this social worker that [facility] will [facility].  Plan: This Child psychotherapistsocial worker will follow patient's progress [facility].   Lisa ReamsChrystal Taino Maertens, LCSW [facility] Care Management [phone]

## 2016-06-25 NOTE — Assessment & Plan Note (Signed)
A1c would be due in late January 2018

## 2016-06-25 NOTE — Assessment & Plan Note (Signed)
Potassium 3.1, 10 mEq daily recommended with follow-up BMET 12/21 This supplementation should suffice in context of elevated creatinine and decrease in the dose of diuretics

## 2016-06-25 NOTE — Assessment & Plan Note (Signed)
Lasix will be decreased to 40 mg in the morning and 20 mg the evening and metolazone decreased to 2.5 mg Monday, Wednesday, and Friday because of the increasing creatinine which appears to have stabilized. BMET recommended 1221/17  @ new facility.

## 2016-06-25 NOTE — Patient Instructions (Addendum)
See Current Assessment & Plan in Problem List under specific Diagnosis Normal saline  IV will be discontinued.  Lasix 40 mg in the morning and 20 mg in the evening recommended. Metolazone 2.5 mg will be administered Monday, Wednesday, and Friday. KCL 10 mg daily would be recommended with follow-up of the BMET 12/21 @ the new facility.

## 2016-06-26 DIAGNOSIS — E876 Hypokalemia: Secondary | ICD-10-CM | POA: Diagnosis not present

## 2016-06-26 DIAGNOSIS — N183 Chronic kidney disease, stage 3 (moderate): Secondary | ICD-10-CM | POA: Diagnosis not present

## 2016-06-26 DIAGNOSIS — I5032 Chronic diastolic (congestive) heart failure: Secondary | ICD-10-CM | POA: Diagnosis not present

## 2016-06-26 DIAGNOSIS — R627 Adult failure to thrive: Secondary | ICD-10-CM | POA: Diagnosis not present

## 2016-06-27 ENCOUNTER — Other Ambulatory Visit: Payer: Self-pay | Admitting: *Deleted

## 2016-06-27 DIAGNOSIS — L98499 Non-pressure chronic ulcer of skin of other sites with unspecified severity: Secondary | ICD-10-CM | POA: Diagnosis not present

## 2016-06-27 NOTE — Patient Outreach (Signed)
w2wssssssssssssssssssssssssssssssssssssssssssssssss [facility]) Care Management  [facility]  06/27/2016  [name] Pettie 08-04-1940 [id number]  Subjective:  Patient is a 75 year old female [facility]  Patient states that she is ready to return home and is motivated to participate in rehab. Patient requested assistance in getting increased in home personal care hours and possibly a wheelchair ramp as she has two steps to navigagte leading up to her apartment door.  Objective:   Encounter Medications:  Outpatient Encounter Prescriptions as of 06/27/2016  Medication Sig  . amLODipine (NORVASC) 5 MG tablet Take 1 tablet (5 mg total) by mouth daily.  Marland Kitchen. apixaban (ELIQUIS) 5 MG TABS tablet Take 1 tablet (5 mg total) by mouth 2 (two) times daily.  Marland Kitchen. atorvastatin (LIPITOR) 10 MG tablet Take 1 tablet (10 mg total) by mouth daily at 6 PM.  . buPROPion (WELLBUTRIN XL) 150 MG 24 hr tablet Take 150 mg by mouth daily. Reported on 08/02/2015  . esomeprazole (NEXIUM) 40 MG capsule Take 1 capsule (40 mg total) by mouth daily before breakfast.  . fluticasone (FLOVENT HFA) 220 MCG/ACT inhaler Inhale 2 puffs into the lungs 2 (two) times daily as needed (shortness of breath/ wheezing).  . furosemide (LASIX) 80 MG tablet Take 1 tablet (80 mg total) by mouth daily.  Marland Kitchen. gabapentin (NEURONTIN) 600 MG tablet Take 1 tablet (600 mg total) by mouth 2 (two) times daily.  . hydrALAZINE (APRESOLINE) 25 MG tablet Take 1 tablet (25 mg total) by mouth every 8 (eight) hours.  Marland Kitchen. ipratropium-albuterol (DUONEB) 0.5-2.5 (3) MG/3ML SOLN Take 3 mLs by nebulization every 6 (six) hours as needed. Wheezing or shortness of breath. (Patient taking differently: Take 3 mLs by nebulization every 6 (six) hours as needed (shortness of breath/ wheezing). )  . isosorbide mononitrate (IMDUR) 30 MG 24 hr tablet Take 1 tablet (30 mg total) by mouth daily.  . metoprolol succinate (TOPROL-XL)  50 MG 24 hr tablet Take 1 tablet (50 mg total) by mouth daily. Take with or immediately following a meal.  . OXYGEN Inhale 2 L into the lungs continuous.  . traMADol (ULTRAM) 50 MG tablet Take 50 mg by mouth every 12 (twelve) hours.   No facility-administered encounter medications on file as of 06/27/2016.     Functional Status:  In your present state of health, do you have any difficulty performing the following activities: 06/17/2016 06/03/2016  Hearing? N -  Vision? N -  Difficulty concentrating or making decisions? N -  Walking or climbing stairs? Y -  Dressing or bathing? Y Y  Doing errands, shopping? Y -  Quarry managerreparing Food and eating ? Y -  Using the Toilet? Y -  In the past six months, have you accidently leaked urine? Y -  Do you have problems with loss of bowel control? Y -  Managing your Medications? N -  Managing your Finances? N -  Housekeeping or managing your Housekeeping? N -  Some recent data might be hidden    Fall/Depression Screening:  PHQ 2/9 Scores 06/17/2016 10/26/2015  PHQ - 2 Score 1 6  PHQ- 9 Score - 16    Assessment:  Patient in bed receiving wound care when this social worker and pharmacist Arn MedalKatina Rice arrived.  Following would care, patient was in bed doing exercises.  Patient anxious to return home and is motivated to participate in therapy.  Patient requested an increase in personal care services and possibly a wheelchair ramp.  Patient describes her daughters as supportive, however one lives out of state and the other daughter cannot assist with her care full time.  This social worker spoke with Clent JacksSholonda, the discharge planner, there has been  no discharge date set yet however increasing  personal care services dicsussed as well s need for a wheelchair ramp. Clent JacksSholonda also discussed that she will be speaking with patient regarding the possibility of Assisted Living.  If patient opts to return home, [facility]  Plan: This Child psychotherapistsocial worker  will continue to follow patient's progress while she is in the SNF.   Adriana ReamsChrystal Land, LCSW [facility] Care Management [phone]

## 2016-06-28 ENCOUNTER — Ambulatory Visit (HOSPITAL_COMMUNITY): Admission: RE | Admit: 2016-06-28 | Payer: Medicare Other | Source: Ambulatory Visit

## 2016-06-28 DIAGNOSIS — I10 Essential (primary) hypertension: Secondary | ICD-10-CM | POA: Diagnosis not present

## 2016-06-28 DIAGNOSIS — I482 Chronic atrial fibrillation: Secondary | ICD-10-CM | POA: Diagnosis not present

## 2016-06-28 DIAGNOSIS — I25810 Atherosclerosis of coronary artery bypass graft(s) without angina pectoris: Secondary | ICD-10-CM | POA: Diagnosis not present

## 2016-06-28 DIAGNOSIS — I5032 Chronic diastolic (congestive) heart failure: Secondary | ICD-10-CM | POA: Diagnosis not present

## 2016-07-04 ENCOUNTER — Telehealth (HOSPITAL_COMMUNITY): Payer: Self-pay | Admitting: *Deleted

## 2016-07-04 DIAGNOSIS — L98419 Non-pressure chronic ulcer of buttock with unspecified severity: Secondary | ICD-10-CM | POA: Diagnosis not present

## 2016-07-04 NOTE — Telephone Encounter (Signed)
[facility] given detailed instructions per Myocardial Perfusion Study Information Sheet for the test on 07/10/16 at 12:45. Patient notified to arrive 15 minutes early and that it is imperative to arrive on time for appointment to keep from having the test rescheduled.  If you need to cancel or reschedule your appointment, please call the office within 24 hours of your appointment. Failure to do so may result in a cancellation of your appointment, and a $50 no show fee. Patient verbalized understanding.Daneil DolinSharon S Brooks

## 2016-07-05 NOTE — Telephone Encounter (Signed)
Unfortunately once she is well we will have to see her and start the paperwork again.  The good thing is once we have the signed paperwork she can be scheduled to pick them up.

## 2016-07-10 ENCOUNTER — Ambulatory Visit (HOSPITAL_COMMUNITY): Payer: Self-pay

## 2016-07-11 ENCOUNTER — Ambulatory Visit: Payer: Self-pay | Admitting: *Deleted

## 2016-07-11 ENCOUNTER — Ambulatory Visit (HOSPITAL_COMMUNITY): Payer: Self-pay

## 2016-07-12 ENCOUNTER — Encounter: Payer: Self-pay | Admitting: *Deleted

## 2016-07-12 ENCOUNTER — Other Ambulatory Visit: Payer: Self-pay | Admitting: *Deleted

## 2016-07-12 NOTE — Patient Outreach (Signed)
[facility]) Care Management  [facility]  07/12/2016  [name] Poythress 1941-04-04 [id number]  Subjective:  Patient is a 76 year old female currently in [facility]  Patient states that she wants to return home and states that she is doing the best she can.  Per patient, the discharge planner has spoken to her about long term care options, however   she has adamantly declined this option.  Per patient, her [place] from [place] will be staying with her when she returns home.   This Child psychotherapistsocial worker spoke with discharge planner Marcille BuffySholonda Dickerson who states that patient's participation in rehab has improved in the last few days due to her desire to return home.  Patient does not have a discharge date yet but they are anticipating 1 to 2 additional weeks in rehab.  Patient will discharge with home health.  In home personal care aid services will resume. Per Clent JacksSholonda, she will complete the request to increase patient's in home aid hours.  Patient also has 2 steps leading up to her home.  Per Clent JacksSholonda she has spoken with patient regarding ramp rental.    Objective:   Encounter Medications:  Outpatient Encounter Prescriptions as of 07/12/2016  Medication Sig  . amLODipine (NORVASC) 5 MG tablet Take 1 tablet (5 mg total) by mouth daily.  Marland Kitchen. apixaban (ELIQUIS) 5 MG TABS tablet Take 1 tablet (5 mg total) by mouth 2 (two) times daily.  Marland Kitchen. atorvastatin (LIPITOR) 10 MG tablet Take 1 tablet (10 mg total) by mouth daily at 6 PM.  . buPROPion (WELLBUTRIN XL) 150 MG 24 hr tablet Take 150 mg by mouth daily. Reported on 08/02/2015  . esomeprazole (NEXIUM) 40 MG capsule Take 1 capsule (40 mg total) by mouth daily before breakfast.  . fluticasone (FLOVENT HFA) 220 MCG/ACT inhaler Inhale 2 puffs into the lungs 2 (two) times daily as needed (shortness of breath/ wheezing).  . furosemide (LASIX) 80 MG tablet Take 1 tablet (80 mg total) by mouth daily.  Marland Kitchen. gabapentin (NEURONTIN)  600 MG tablet Take 1 tablet (600 mg total) by mouth 2 (two) times daily.  . hydrALAZINE (APRESOLINE) 25 MG tablet Take 1 tablet (25 mg total) by mouth every 8 (eight) hours.  Marland Kitchen. ipratropium-albuterol (DUONEB) 0.5-2.5 (3) MG/3ML SOLN Take 3 mLs by nebulization every 6 (six) hours as needed. Wheezing or shortness of breath. (Patient taking differently: Take 3 mLs by nebulization every 6 (six) hours as needed (shortness of breath/ wheezing). )  . isosorbide mononitrate (IMDUR) 30 MG 24 hr tablet Take 1 tablet (30 mg total) by mouth daily.  . metoprolol succinate (TOPROL-XL) 50 MG 24 hr tablet Take 1 tablet (50 mg total) by mouth daily. Take with or immediately following a meal.  . OXYGEN Inhale 2 L into the lungs continuous.  . traMADol (ULTRAM) 50 MG tablet Take 50 mg by mouth every 12 (twelve) hours.   No facility-administered encounter medications on file as of 07/12/2016.     Functional Status:  In your present state of health, do you have any difficulty performing the following activities: 06/17/2016 06/03/2016  Hearing? N -  Vision? N -  Difficulty concentrating or making decisions? N -  Walking or climbing stairs? Y -  Dressing or bathing? Y Y  Doing errands, shopping? Y -  Quarry managerreparing Food and eating ? Y -  Using the Toilet? Y -  In the past six months, have you accidently leaked urine? Y -  Do you have problems with loss of bowel control? Y -  Managing your Medications? N -  Managing your Finances? N -  Housekeeping or managing your Housekeeping? N -  Some recent data might be hidden    Fall/Depression Screening:  PHQ 2/9 Scores 06/17/2016 10/26/2015  PHQ - 2 Score 1 6  PHQ- 9 Score - 16    Assessment:  Patient friendly and engaging, however admitted to depressed mood due to desire to return discharge home.  Patient continues to require assistance with mobility and ADL's.  Patient will need a wheelchair ramp due to 2 steps leading up to her front door. Possibility of rental  discussed, patient did not want this social worker to discuss this need with her landlord and is interested in rental.  Patient confirms that her daughter will be staying with her for a while to assist with care.   Plan: This Child psychotherapist will continue to follow patient's progress in rehab. This Child psychotherapist to follow up with patient within 2 weeks.   Adriana Reams [facility] Care Management [phone]

## 2016-07-18 ENCOUNTER — Ambulatory Visit: Payer: Self-pay | Admitting: *Deleted

## 2016-07-18 ENCOUNTER — Encounter: Payer: Self-pay | Admitting: Cardiology

## 2016-07-19 ENCOUNTER — Other Ambulatory Visit: Payer: Self-pay | Admitting: *Deleted

## 2016-07-19 NOTE — Patient Outreach (Signed)
[facility]) Care Management  [facility] Social Work  07/19/2016  SHONIA SKILLING 1940/10/18 [id number]  Subjective:  Patient is a 76 year old female currently at [facility]  Patient states that she has a bladder infection and has not been feeling well.  She admits to missing therapy due to illness. Per patient, she is still wanting to return home, however her daughter is now suggesting that she move to [place] with her daughter. Per patient, she has tried this before and it did not work out. Patient's goal is to return to her own home with personal care services.  Objective:   Encounter Medications:  Outpatient Encounter Prescriptions as of 07/19/2016  Medication Sig  . amLODipine (NORVASC) 5 MG tablet Take 1 tablet (5 mg total) by mouth daily.  Marland Kitchen apixaban (ELIQUIS) 5 MG TABS tablet Take 1 tablet (5 mg total) by mouth 2 (two) times daily.  Marland Kitchen atorvastatin (LIPITOR) 10 MG tablet Take 1 tablet (10 mg total) by mouth daily at 6 PM.  . buPROPion (WELLBUTRIN XL) 150 MG 24 hr tablet Take 150 mg by mouth daily. Reported on 08/02/2015  . esomeprazole (NEXIUM) 40 MG capsule Take 1 capsule (40 mg total) by mouth daily before breakfast.  . fluticasone (FLOVENT HFA) 220 MCG/ACT inhaler Inhale 2 puffs into the lungs 2 (two) times daily as needed (shortness of breath/ wheezing).  . furosemide (LASIX) 80 MG tablet Take 1 tablet (80 mg total) by mouth daily.  Marland Kitchen gabapentin (NEURONTIN) 600 MG tablet Take 1 tablet (600 mg total) by mouth 2 (two) times daily.  . hydrALAZINE (APRESOLINE) 25 MG tablet Take 1 tablet (25 mg total) by mouth every 8 (eight) hours.  Marland Kitchen ipratropium-albuterol (DUONEB) 0.5-2.5 (3) MG/3ML SOLN Take 3 mLs by nebulization every 6 (six) hours as needed. Wheezing or shortness of breath. (Patient taking differently: Take 3 mLs by nebulization every 6 (six) hours as needed (shortness of breath/ wheezing). )  . isosorbide mononitrate (IMDUR) 30 MG 24 hr tablet Take 1 tablet  (30 mg total) by mouth daily.  . metoprolol succinate (TOPROL-XL) 50 MG 24 hr tablet Take 1 tablet (50 mg total) by mouth daily. Take with or immediately following a meal.  . OXYGEN Inhale 2 L into the lungs continuous.  . traMADol (ULTRAM) 50 MG tablet Take 50 mg by mouth every 12 (twelve) hours.   No facility-administered encounter medications on file as of 07/19/2016.     Functional Status:  In your present state of health, do you have any difficulty performing the following activities: 06/17/2016 06/03/2016  Hearing? N -  Vision? N -  Difficulty concentrating or making decisions? N -  Walking or climbing stairs? Y -  Dressing or bathing? Y Y  Doing errands, shopping? Y -  Quarry manager and eating ? Y -  Using the Toilet? Y -  In the past six months, have you accidently leaked urine? Y -  Do you have problems with loss of bowel control? Y -  Managing your Medications? N -  Managing your Finances? N -  Housekeeping or managing your Housekeeping? N -  Some recent data might be hidden    Fall/Depression Screening:  PHQ 2/9 Scores 06/17/2016 10/26/2015  PHQ - 2 Score 1 6  PHQ- 9 Score - 16    Assessment:  This Child psychotherapist spoke with discharge planner Marcille Buffy.  Per Clent Jacks, patient is not progressing and has been refusing therapy.  A psychological consult has been  ordered.  Per Clent JacksSholonda, patient may be looking at discharge as early as next week if she does not make more effort to participate.  This social worker emphasized the importance of participation in therapy in order to be more successful when she returns home.   Plan: This Child psychotherapistsocial worker will continue to follow patient's progress while in rehab. This Child psychotherapistsocial worker will follow up in one week.   Adriana ReamsChrystal Fidelis Loth, LCSW [facility] Care [facility] [phone]

## 2016-08-02 ENCOUNTER — Other Ambulatory Visit: Payer: Self-pay | Admitting: *Deleted

## 2016-08-02 ENCOUNTER — Inpatient Hospital Stay (HOSPITAL_COMMUNITY)
Admission: EM | Admit: 2016-08-02 | Discharge: 2016-08-10 | DRG: 480 | Disposition: A | Discharge status: Skilled Nursing Facility | Payer: Medicare Other | Attending: Internal Medicine | Admitting: Internal Medicine

## 2016-08-02 ENCOUNTER — Emergency Department (HOSPITAL_COMMUNITY): Payer: Medicare Other

## 2016-08-02 ENCOUNTER — Encounter (HOSPITAL_COMMUNITY): Payer: Self-pay | Admitting: Emergency Medicine

## 2016-08-02 DIAGNOSIS — Z8249 Family history of ischemic heart disease and other diseases of the circulatory system: Secondary | ICD-10-CM

## 2016-08-02 DIAGNOSIS — W1839XA Other fall on same level, initial encounter: Secondary | ICD-10-CM | POA: Diagnosis present

## 2016-08-02 DIAGNOSIS — Z7901 Long term (current) use of anticoagulants: Secondary | ICD-10-CM

## 2016-08-02 DIAGNOSIS — R7881 Bacteremia: Secondary | ICD-10-CM | POA: Diagnosis not present

## 2016-08-02 DIAGNOSIS — N1330 Unspecified hydronephrosis: Secondary | ICD-10-CM | POA: Diagnosis present

## 2016-08-02 DIAGNOSIS — Z800 Family history of malignant neoplasm of digestive organs: Secondary | ICD-10-CM

## 2016-08-02 DIAGNOSIS — N183 Chronic kidney disease, stage 3 (moderate): Secondary | ICD-10-CM | POA: Diagnosis present

## 2016-08-02 DIAGNOSIS — Z7982 Long term (current) use of aspirin: Secondary | ICD-10-CM

## 2016-08-02 DIAGNOSIS — Z6841 Body Mass Index (BMI) 40.0 and over, adult: Secondary | ICD-10-CM | POA: Diagnosis not present

## 2016-08-02 DIAGNOSIS — Z96652 Presence of left artificial knee joint: Secondary | ICD-10-CM | POA: Diagnosis present

## 2016-08-02 DIAGNOSIS — S72452A Displaced supracondylar fracture without intracondylar extension of lower end of left femur, initial encounter for closed fracture: Principal | ICD-10-CM | POA: Diagnosis present

## 2016-08-02 DIAGNOSIS — W19XXXA Unspecified fall, initial encounter: Secondary | ICD-10-CM

## 2016-08-02 DIAGNOSIS — I5032 Chronic diastolic (congestive) heart failure: Secondary | ICD-10-CM | POA: Diagnosis present

## 2016-08-02 DIAGNOSIS — Z9981 Dependence on supplemental oxygen: Secondary | ICD-10-CM | POA: Diagnosis not present

## 2016-08-02 DIAGNOSIS — I130 Hypertensive heart and chronic kidney disease with heart failure and stage 1 through stage 4 chronic kidney disease, or unspecified chronic kidney disease: Secondary | ICD-10-CM | POA: Diagnosis present

## 2016-08-02 DIAGNOSIS — I480 Paroxysmal atrial fibrillation: Secondary | ICD-10-CM | POA: Diagnosis present

## 2016-08-02 DIAGNOSIS — S72092A Other fracture of head and neck of left femur, initial encounter for closed fracture: Secondary | ICD-10-CM

## 2016-08-02 DIAGNOSIS — Z87891 Personal history of nicotine dependence: Secondary | ICD-10-CM

## 2016-08-02 DIAGNOSIS — E860 Dehydration: Secondary | ICD-10-CM | POA: Diagnosis present

## 2016-08-02 DIAGNOSIS — Z981 Arthrodesis status: Secondary | ICD-10-CM

## 2016-08-02 DIAGNOSIS — E1122 Type 2 diabetes mellitus with diabetic chronic kidney disease: Secondary | ICD-10-CM | POA: Diagnosis present

## 2016-08-02 DIAGNOSIS — J9611 Chronic respiratory failure with hypoxia: Secondary | ICD-10-CM | POA: Diagnosis present

## 2016-08-02 DIAGNOSIS — E43 Unspecified severe protein-calorie malnutrition: Secondary | ICD-10-CM | POA: Diagnosis present

## 2016-08-02 DIAGNOSIS — M25552 Pain in left hip: Secondary | ICD-10-CM | POA: Diagnosis present

## 2016-08-02 DIAGNOSIS — Z9049 Acquired absence of other specified parts of digestive tract: Secondary | ICD-10-CM

## 2016-08-02 DIAGNOSIS — J449 Chronic obstructive pulmonary disease, unspecified: Secondary | ICD-10-CM | POA: Diagnosis present

## 2016-08-02 DIAGNOSIS — N179 Acute kidney failure, unspecified: Secondary | ICD-10-CM | POA: Diagnosis present

## 2016-08-02 DIAGNOSIS — G9340 Encephalopathy, unspecified: Secondary | ICD-10-CM | POA: Diagnosis not present

## 2016-08-02 DIAGNOSIS — E875 Hyperkalemia: Secondary | ICD-10-CM | POA: Diagnosis not present

## 2016-08-02 DIAGNOSIS — R8271 Bacteriuria: Secondary | ICD-10-CM | POA: Diagnosis not present

## 2016-08-02 DIAGNOSIS — S82432A Displaced oblique fracture of shaft of left fibula, initial encounter for closed fracture: Secondary | ICD-10-CM | POA: Diagnosis present

## 2016-08-02 DIAGNOSIS — Z87440 Personal history of urinary (tract) infections: Secondary | ICD-10-CM

## 2016-08-02 DIAGNOSIS — D62 Acute posthemorrhagic anemia: Secondary | ICD-10-CM | POA: Diagnosis present

## 2016-08-02 DIAGNOSIS — L89309 Pressure ulcer of unspecified buttock, unspecified stage: Secondary | ICD-10-CM | POA: Diagnosis present

## 2016-08-02 DIAGNOSIS — G4733 Obstructive sleep apnea (adult) (pediatric): Secondary | ICD-10-CM | POA: Diagnosis present

## 2016-08-02 DIAGNOSIS — E785 Hyperlipidemia, unspecified: Secondary | ICD-10-CM | POA: Diagnosis present

## 2016-08-02 DIAGNOSIS — X58XXXD Exposure to other specified factors, subsequent encounter: Secondary | ICD-10-CM | POA: Diagnosis not present

## 2016-08-02 DIAGNOSIS — Z960 Presence of urogenital implants: Secondary | ICD-10-CM | POA: Diagnosis not present

## 2016-08-02 DIAGNOSIS — Z79899 Other long term (current) drug therapy: Secondary | ICD-10-CM

## 2016-08-02 DIAGNOSIS — Z951 Presence of aortocoronary bypass graft: Secondary | ICD-10-CM

## 2016-08-02 DIAGNOSIS — N135 Crossing vessel and stricture of ureter without hydronephrosis: Secondary | ICD-10-CM | POA: Diagnosis present

## 2016-08-02 DIAGNOSIS — Z419 Encounter for procedure for purposes other than remedying health state, unspecified: Secondary | ICD-10-CM

## 2016-08-02 DIAGNOSIS — Z7951 Long term (current) use of inhaled steroids: Secondary | ICD-10-CM

## 2016-08-02 DIAGNOSIS — D649 Anemia, unspecified: Secondary | ICD-10-CM | POA: Diagnosis not present

## 2016-08-02 DIAGNOSIS — I2510 Atherosclerotic heart disease of native coronary artery without angina pectoris: Secondary | ICD-10-CM | POA: Diagnosis present

## 2016-08-02 DIAGNOSIS — R319 Hematuria, unspecified: Secondary | ICD-10-CM | POA: Diagnosis not present

## 2016-08-02 DIAGNOSIS — S7292XD Unspecified fracture of left femur, subsequent encounter for closed fracture with routine healing: Secondary | ICD-10-CM | POA: Diagnosis not present

## 2016-08-02 DIAGNOSIS — Z87448 Personal history of other diseases of urinary system: Secondary | ICD-10-CM | POA: Diagnosis not present

## 2016-08-02 DIAGNOSIS — Z66 Do not resuscitate: Secondary | ICD-10-CM | POA: Diagnosis present

## 2016-08-02 DIAGNOSIS — Y92009 Unspecified place in unspecified non-institutional (private) residence as the place of occurrence of the external cause: Secondary | ICD-10-CM

## 2016-08-02 DIAGNOSIS — R791 Abnormal coagulation profile: Secondary | ICD-10-CM | POA: Diagnosis present

## 2016-08-02 DIAGNOSIS — D696 Thrombocytopenia, unspecified: Secondary | ICD-10-CM | POA: Diagnosis present

## 2016-08-02 DIAGNOSIS — Z90710 Acquired absence of both cervix and uterus: Secondary | ICD-10-CM

## 2016-08-02 DIAGNOSIS — I509 Heart failure, unspecified: Secondary | ICD-10-CM

## 2016-08-02 DIAGNOSIS — Z955 Presence of coronary angioplasty implant and graft: Secondary | ICD-10-CM

## 2016-08-02 DIAGNOSIS — I481 Persistent atrial fibrillation: Secondary | ICD-10-CM | POA: Diagnosis not present

## 2016-08-02 DIAGNOSIS — K219 Gastro-esophageal reflux disease without esophagitis: Secondary | ICD-10-CM | POA: Diagnosis present

## 2016-08-02 DIAGNOSIS — S7292XA Unspecified fracture of left femur, initial encounter for closed fracture: Secondary | ICD-10-CM

## 2016-08-02 LAB — COMPREHENSIVE METABOLIC PANEL
ALK PHOS: 181 U/L — AB (ref 38–126)
ALT: 39 U/L (ref 14–54)
ANION GAP: 10 (ref 5–15)
AST: 58 U/L — ABNORMAL HIGH (ref 15–41)
Albumin: 1.8 g/dL — ABNORMAL LOW (ref 3.5–5.0)
BUN: 73 mg/dL — ABNORMAL HIGH (ref 6–20)
CALCIUM: 8.5 mg/dL — AB (ref 8.9–10.3)
CO2: 14 mmol/L — ABNORMAL LOW (ref 22–32)
CREATININE: 3.11 mg/dL — AB (ref 0.44–1.00)
Chloride: 111 mmol/L (ref 101–111)
GFR, EST AFRICAN AMERICAN: 16 mL/min — AB (ref >60)
GFR, EST NON AFRICAN AMERICAN: 13 mL/min — AB (ref >60)
Glucose, Bld: 160 mg/dL — ABNORMAL HIGH (ref 65–99)
Potassium: 5.5 mmol/L — ABNORMAL HIGH (ref 3.5–5.1)
SODIUM: 135 mmol/L (ref 135–145)
TOTAL PROTEIN: 7.1 g/dL (ref 6.5–8.1)
Total Bilirubin: 2.4 mg/dL — ABNORMAL HIGH (ref 0.3–1.2)

## 2016-08-02 LAB — CBC WITH DIFFERENTIAL/PLATELET
Basophils Absolute: 0 10*3/uL (ref 0.0–0.1)
Basophils Relative: 0 %
EOS ABS: 0.1 10*3/uL (ref 0.0–0.7)
EOS PCT: 1 %
HCT: 23 % — ABNORMAL LOW (ref 36.0–46.0)
HEMOGLOBIN: 7.2 g/dL — AB (ref 12.0–15.0)
LYMPHS ABS: 1.4 10*3/uL (ref 0.7–4.0)
LYMPHS PCT: 11 %
MCH: 29.1 pg (ref 26.0–34.0)
MCHC: 31.3 g/dL (ref 30.0–36.0)
MCV: 93.1 fL (ref 78.0–100.0)
MONOS PCT: 5 %
Monocytes Absolute: 0.6 10*3/uL (ref 0.1–1.0)
Neutro Abs: 11 10*3/uL — ABNORMAL HIGH (ref 1.7–7.7)
Neutrophils Relative %: 83 %
Platelets: 138 10*3/uL — ABNORMAL LOW (ref 150–400)
RBC: 2.47 MIL/uL — AB (ref 3.87–5.11)
RDW: 18.8 % — ABNORMAL HIGH (ref 11.5–15.5)
WBC: 13.1 10*3/uL — AB (ref 4.0–10.5)

## 2016-08-02 LAB — PROTIME-INR
INR: 2
Prothrombin Time: 23 seconds — ABNORMAL HIGH (ref 11.4–15.2)

## 2016-08-02 MED ORDER — BISACODYL 5 MG PO TBEC
5.0000 mg | DELAYED_RELEASE_TABLET | Freq: Every day | ORAL | Status: DC | PRN
  Administered 2016-08-07 – 2016-08-08 (×2): 5 mg via ORAL
  Filled 2016-08-02 (×2): qty 1

## 2016-08-02 MED ORDER — ONDANSETRON HCL 4 MG PO TABS: 4.0000 mg | ORAL_TABLET | Freq: Four times a day (QID) | ORAL | Status: DC | PRN

## 2016-08-02 MED ORDER — POLYETHYLENE GLYCOL 3350 17 G PO PACK: 17.0000 g | PACK | Freq: Every day | ORAL | Status: DC | PRN

## 2016-08-02 MED ORDER — FLUTICASONE PROPIONATE HFA 220 MCG/ACT IN AERO: 2.0000 | INHALATION_SPRAY | Freq: Two times a day (BID) | RESPIRATORY_TRACT | Status: DC

## 2016-08-02 MED ORDER — PANTOPRAZOLE SODIUM 40 MG PO TBEC
40.0000 mg | DELAYED_RELEASE_TABLET | Freq: Every day | ORAL | Status: DC
  Administered 2016-08-03 – 2016-08-10 (×7): 40 mg via ORAL
  Filled 2016-08-02 (×7): qty 1

## 2016-08-02 MED ORDER — GABAPENTIN 600 MG PO TABS
600.0000 mg | ORAL_TABLET | Freq: Two times a day (BID) | ORAL | Status: DC
  Administered 2016-08-03 – 2016-08-04 (×4): 600 mg via ORAL
  Filled 2016-08-02 (×4): qty 1

## 2016-08-02 MED ORDER — BUPROPION HCL ER (XL) 150 MG PO TB24
150.0000 mg | ORAL_TABLET | Freq: Every day | ORAL | Status: DC
  Administered 2016-08-03 – 2016-08-10 (×7): 150 mg via ORAL
  Filled 2016-08-02 (×7): qty 1

## 2016-08-02 MED ORDER — FENTANYL CITRATE (PF) 100 MCG/2ML IJ SOLN
50.0000 ug | Freq: Once | INTRAMUSCULAR | Status: AC
  Administered 2016-08-02: 50 ug via INTRAVENOUS
  Filled 2016-08-02: qty 2

## 2016-08-02 MED ORDER — SODIUM CHLORIDE 0.9 % IV SOLN
INTRAVENOUS | Status: AC
  Administered 2016-08-03: 02:00:00 via INTRAVENOUS

## 2016-08-02 MED ORDER — ACETAMINOPHEN 325 MG PO TABS
650.0000 mg | ORAL_TABLET | Freq: Four times a day (QID) | ORAL | Status: DC | PRN
Start: 2016-08-02 — End: 2016-08-06
  Filled 2016-08-02: qty 2

## 2016-08-02 MED ORDER — HYDRALAZINE HCL 25 MG PO TABS
25.0000 mg | ORAL_TABLET | Freq: Three times a day (TID) | ORAL | Status: DC
  Administered 2016-08-03 – 2016-08-10 (×10): 25 mg via ORAL
  Filled 2016-08-02 (×12): qty 1

## 2016-08-02 MED ORDER — MORPHINE SULFATE (PF) 4 MG/ML IV SOLN
3.0000 mg | Freq: Once | INTRAVENOUS | Status: AC
  Administered 2016-08-02: 3 mg via INTRAVENOUS
  Filled 2016-08-02: qty 1

## 2016-08-02 MED ORDER — ACETAMINOPHEN 650 MG RE SUPP: 650.0000 mg | Freq: Four times a day (QID) | RECTAL | Status: DC | PRN

## 2016-08-02 MED ORDER — HYDROCODONE-ACETAMINOPHEN 5-325 MG PO TABS
1.0000 | ORAL_TABLET | ORAL | Status: DC | PRN
  Administered 2016-08-03 (×4): 2 via ORAL
  Filled 2016-08-02 (×4): qty 2

## 2016-08-02 MED ORDER — ONDANSETRON HCL 4 MG/2ML IJ SOLN
4.0000 mg | Freq: Four times a day (QID) | INTRAMUSCULAR | Status: DC | PRN
Start: 2016-08-02 — End: 2016-08-06

## 2016-08-02 MED ORDER — AMLODIPINE BESYLATE 5 MG PO TABS
5.0000 mg | ORAL_TABLET | Freq: Every day | ORAL | Status: DC
  Administered 2016-08-03 – 2016-08-10 (×7): 5 mg via ORAL
  Filled 2016-08-02 (×7): qty 1

## 2016-08-02 MED ORDER — IPRATROPIUM-ALBUTEROL 0.5-2.5 (3) MG/3ML IN SOLN: 3.0000 mL | Freq: Four times a day (QID) | RESPIRATORY_TRACT | Status: DC | PRN

## 2016-08-02 MED ORDER — MORPHINE SULFATE (PF) 4 MG/ML IV SOLN
1.0000 mg | INTRAVENOUS | Status: DC | PRN
  Administered 2016-08-03: 3 mg via INTRAVENOUS
  Administered 2016-08-03 – 2016-08-05 (×3): 2 mg via INTRAVENOUS
  Filled 2016-08-02 (×4): qty 1

## 2016-08-02 MED ORDER — SODIUM CHLORIDE 0.9% FLUSH
3.0000 mL | Freq: Two times a day (BID) | INTRAVENOUS | Status: DC
  Administered 2016-08-06 – 2016-08-10 (×5): 3 mL via INTRAVENOUS

## 2016-08-02 MED ORDER — SODIUM CHLORIDE 0.9 % IV BOLUS (SEPSIS)
500.0000 mL | Freq: Once | INTRAVENOUS | Status: AC
  Administered 2016-08-02: 500 mL via INTRAVENOUS

## 2016-08-02 MED ORDER — DOCUSATE SODIUM 100 MG PO CAPS
100.0000 mg | ORAL_CAPSULE | Freq: Two times a day (BID) | ORAL | Status: DC
  Administered 2016-08-03 – 2016-08-10 (×13): 100 mg via ORAL
  Filled 2016-08-02 (×13): qty 1

## 2016-08-02 MED ORDER — ISOSORBIDE MONONITRATE ER 30 MG PO TB24
30.0000 mg | ORAL_TABLET | Freq: Every day | ORAL | Status: DC
  Administered 2016-08-03 – 2016-08-10 (×7): 30 mg via ORAL
  Filled 2016-08-02 (×7): qty 1

## 2016-08-02 MED ORDER — METOPROLOL SUCCINATE ER 50 MG PO TB24
50.0000 mg | ORAL_TABLET | Freq: Every day | ORAL | Status: DC
  Administered 2016-08-03 – 2016-08-10 (×8): 50 mg via ORAL
  Filled 2016-08-02 (×9): qty 1

## 2016-08-02 MED ORDER — ATORVASTATIN CALCIUM 10 MG PO TABS
10.0000 mg | ORAL_TABLET | Freq: Every day | ORAL | Status: DC
  Administered 2016-08-03 – 2016-08-09 (×6): 10 mg via ORAL
  Filled 2016-08-02 (×6): qty 1

## 2016-08-02 NOTE — Patient Outreach (Addendum)
[facility]) Care Management  08/02/2016  Lisa SchneidersGrace A Leveille Jan 19, 1941 [id number]   Phone call to patient to confirm [facility] on 07[facility]/16. Per patient, she has been discharged home,  Her daughter, Santina EvansCatherine will be staying with her for a while to assist with her care needs. It has been confirmed that [facility] home health has reached out to patient, however PT and bath aid services have not started yet.  Patient states that she has a wheelchair, hospital bed, oxygen, and a  3 in 1 commode, however needs walker. Per patient, she was told that PT would have to assess her before a walker can be ordered. This Child psychotherapistsocial worker informed patient of referral for community care management.   Plan:  This Child psychotherapistsocial worker will complete a home visit within 1 month for follow up.    Adriana ReamsChrystal Chynah Orihuela, LCSW [facility] Care [facility] [phone]

## 2016-08-02 NOTE — Consult Note (Signed)
Reason for Consult:left closed supracondylar periprothetic femur fracture.  Referring Physician:C. Mackuen MD  Lisa [name] is an 76 y.o. female.  HPI: Patient recently got a skilled nursing facility. She's had problems with the congestive heart failure uses home oxygen C Pap at night had problems with some shortness of breath. Ankle fracture in the fall treated by Dr. Alphonzo Severance which had healed. She's only been home for a short period of time when she fell getting up in the chair. Left femur gave way. Previous total knee arthroplasty by Dr. Marily Memos more than 5 years ago still in satisfactory position. She suffered a closed supracondylar femur fracture. She has atrophic is on Zenia Resides was has chronic anemia. Hemoglobin is 7.2 with the creatinine 3.11 and BUN 73. Potassium 5.5. Bilirubin elevated at 2.4 with an INR of 2.0 possibly related to some reaction from [place]'s versus poor renal failure. Shows elevation of AST has low albumin of 1.8 consistent with severe malnourishment. She's been off insulin for several years and now takes oral medications. Previous CABG procedure, history of UTIs at the nursing home.  Fall was at home she has been able to stand since that occurred her movement movement and mobility prior to that was extremely limited she has pain with any attempts at motion and with motion she rates her pain as severe.  Past Medical History:  Diagnosis Date  . Chronic diastolic CHF (congestive heart failure) ([place])   . Coronary artery disease    s/p CABG  . Diabetes mellitus without complication ([place])   . Hyperlipidemia   . Hypertension   . Morbid obesity ([place])   . OSA (obstructive sleep apnea)    mild OSA with AHI 7.15 now on CPAP at 16cm H2O  . RBBB     Past Surgical History:  Procedure Laterality Date  . BACK SURGERY    . CARDIOVERSION N/A 08/03/2015   Procedure: CARDIOVERSION;  Surgeon: Thayer Headings, MD;  Location: [facility];  Service: Cardiovascular;   Laterality: N/A;  . CORONARY ANGIOPLASTY WITH STENT PLACEMENT    . CORONARY ARTERY BYPASS GRAFT    . KNEE SURGERY Bilateral   . ORIF ANKLE FRACTURE Right 08/19/2014   Procedure: OPEN REDUCTION INTERNAL FIXATION (ORIF) ANKLE FRACTURE;  Surgeon: Meredith Pel, MD;  Location: [facility];  Service: Orthopedics;  Laterality: Right;  . rotator cuff surgery      Family History  Problem Relation Age of Onset  . Heart attack Mother   . Heart disease Mother   . Stomach cancer Father     Social History:  reports that she has quit smoking. She has never used smokeless tobacco. She reports that she does not drink alcohol or use drugs.  Allergies: No Known Allergies  Medications: I have reviewed the patient's current medications.  Results for orders placed or performed during the hospital encounter of 08/02/16 (from the past 48 hour(s))  CBC with Differential/Platelet     Status: Abnormal   Collection Time: 08/02/16  9:44 PM  Result Value Ref Range   WBC 13.1 (H) 4.0 - 10.5 K/uL   RBC 2.47 (L) 3.87 - 5.11 MIL/uL   Hemoglobin 7.2 (L) 12.0 - 15.0 g/dL   HCT 23.0 (L) 36.0 - 46.0 %   MCV 93.1 78.0 - 100.0 fL   MCH 29.1 26.0 - 34.0 pg   MCHC 31.3 30.0 - 36.0 g/dL   RDW 18.8 (H) 11.5 - 15.5 %   Platelets 138 (L) 150 - 400  K/uL   Neutrophils Relative % 83 %   Neutro Abs 11.0 (H) 1.7 - 7.7 K/uL   Lymphocytes Relative 11 %   Lymphs Abs 1.4 0.7 - 4.0 K/uL   Monocytes Relative 5 %   Monocytes Absolute 0.6 0.1 - 1.0 K/uL   Eosinophils Relative 1 %   Eosinophils Absolute 0.1 0.0 - 0.7 K/uL   Basophils Relative 0 %   Basophils Absolute 0.0 0.0 - 0.1 K/uL  Comprehensive metabolic panel     Status: Abnormal   Collection Time: 08/02/16  9:44 PM  Result Value Ref Range   Sodium 135 135 - 145 mmol/L   Potassium 5.5 (H) 3.5 - 5.1 mmol/L   Chloride 111 101 - 111 mmol/L   CO2 14 (L) 22 - 32 mmol/L   Glucose, Bld 160 (H) 65 - 99 mg/dL   BUN 73 (H) 6 - 20 mg/dL   Creatinine, Ser 0.64 (H) 0.44 - 1.00  mg/dL   Calcium 8.5 (L) 8.9 - 10.3 mg/dL   Total Protein 7.1 6.5 - 8.1 g/dL   Albumin 1.8 (L) 3.5 - 5.0 g/dL   AST 58 (H) 15 - 41 U/L   ALT 39 14 - 54 U/L   Alkaline Phosphatase 181 (H) 38 - 126 U/L   Total Bilirubin 2.4 (H) 0.3 - 1.2 mg/dL   GFR calc non Af Amer 13 (L) >60 mL/min   GFR calc Af Amer 16 (L) >60 mL/min    Comment: (NOTE) The eGFR has been calculated using the CKD EPI equation. This calculation has not been validated in all clinical situations. eGFR's persistently <60 mL/min signify possible Chronic Kidney Disease.    Anion gap 10 5 - 15  Protime-INR     Status: Abnormal   Collection Time: 08/02/16  9:44 PM  Result Value Ref Range   Prothrombin Time 23.0 (H) 11.4 - 15.2 seconds   INR 2.00     Dg Chest 1 View  Result Date: 08/02/2016 CLINICAL DATA:  Pain after fall.  Preop. EXAM: CHEST 1 VIEW COMPARISON:  06/07/2016 and 05/04/2016 CXR FINDINGS: Low lung volumes as before. Status post CABG. Scarring in the right mid lung and left upper lobe. No pneumonic consolidation, effusion or pneumothorax. Osteoarthritis both glenohumeral joints. No acute fracture identified of the included ribs. IMPRESSION: No active disease.  Chronic right mid and left upper lobe scarring Electronically Signed   By: Tollie Eth M.D.   On: 08/02/2016 21:30   Dg Pelvis 1-2 Views  Result Date: 08/02/2016 CLINICAL DATA:  Pain after fall EXAM: PELVIS - 1-2 VIEW COMPARISON:  None. FINDINGS: There is no evidence of pelvic fracture or diastasis. Hip joints are maintained bilaterally. The included lumbar spine demonstrates lumbar spinal fusion hardware at L4 and L5. No pelvic bone lesions are seen. IMPRESSION: No acute pelvic fracture. No hip dislocation. No proximal femoral fracture is noted. Partially included lumbar fixation hardware noted along the lower lumbar spine to L5. Electronically Signed   By: Tollie Eth M.D.   On: 08/02/2016 21:26   Dg Tibia/fibula Left  Result Date: 08/02/2016 CLINICAL DATA:   Left leg pain after fall EXAM: LEFT TIBIA AND FIBULA - 2 VIEW COMPARISON:  None. FINDINGS: There is a subtle oblique fracture of the distal diaphysis of the fibula just above the tibiotalar joint without definite extension into the ankle joint. Minimally widened appearance of the lateral tibiotalar joint in the craniocaudad axis to 6 mm. Supracondylar fracture of the femur is excluded on this  study. The patient is status post total knee arthroplasty. There is arteriosclerosis along the tibial arteries. IMPRESSION: Subtle oblique closed fracture of the distal diaphysis of the fibula which appears to spare the ankle joint. Minimal widening along the lateral aspect of the tibiotalar joint in the craniocaudad axis up to 6 mm. Electronically Signed   By: Ashley Royalty M.D.   On: 08/02/2016 21:19   Dg Knee Complete 4 Views Left  Result Date: 08/02/2016 CLINICAL DATA:  Acute left knee pain after fall EXAM: LEFT KNEE - COMPLETE 4+ VIEW COMPARISON:  03/31/2014 FINDINGS: Acute, closed, comminuted supracondylar fracture of the left distal femoral diaphysis. There is dorsal displacement of the femoral condyles one shaft width and lateral displaced 1/2 shaft width relative to the femoral shaft. Lipohemarthrosis is noted. The patient is status post left knee arthroplasty. The fracture appears to spare the femoral condylar components. No loosening nor involvement of the tibial tray nor patella. No dislocation at the knee joint. Femoral and popliteal arteriosclerosis. IMPRESSION: Acute, closed, comminuted supracondylar fracture of the distal left femoral diaphysis sparing left knee arthroplasty components with dorsolateral displacement of the femoral condyles. Lipohemarthrosis. Electronically Signed   By: Ashley Royalty M.D.   On: 08/02/2016 21:14   Dg Femur Min 2 Views Left  Result Date: 08/02/2016 CLINICAL DATA:  Pain after fall EXAM: LEFT FEMUR 2 VIEWS COMPARISON:  None. FINDINGS: No acute fracture about the left hip. The  left hip joint is maintained. Left superior and inferior pubic rami appear intact. Acute, closed, comminuted supracondylar fracture of the distal femoral diaphysis sparing the femoral condylar component of a total knee arthroplasty. Small 4.1 x 1.7 x 1.5 cm butterfly fragment is noted dorsolaterally. There is dorsal and lateral displacement of the femoral condyles relative to the femoral shaft by 1/2 shaft width in both directions on these images. IMPRESSION: Acute, closed, comminuted supracondylar fracture of the left distal femoral diaphysis with 1/2 shaft with lateral and dorsal displacement of the femoral condyles relative to the femoral shaft. Electronically Signed   By: Ashley Royalty M.D.   On: 08/02/2016 21:23    Review of Systems  Constitutional:       Obese sedentary poor medical condition all 12 medical problems. She is very alert oriented 3 pleasant and cooperative.  HENT: Negative for hearing loss and tinnitus.   Eyes: Negative for double vision.  Respiratory: Negative for cough and hemoptysis.   Cardiovascular: Negative for chest pain and palpitations.  Gastrointestinal: Negative for heartburn and nausea.  Musculoskeletal:       Left the distal femur instability. Sensation her foot is intact. This is a closed fracture   Blood pressure 115/89, pulse 107, temperature 97.4 F (36.3 C), temperature source Oral, resp. rate 16, height '5\' 4"'$  (1.626 m), weight (!) 320 lb (145.2 kg), SpO2 94 %. Physical Exam  Constitutional: She is oriented to person, place, and time. She appears well-developed.  Obese appears chronically ill. Alert cooperative.  HENT:  Head: Normocephalic.  Eyes: Pupils are equal, round, and reactive to light.  Neck: Normal range of motion.  Cardiovascular:  IR and R  Respiratory:  Mild increased respiratory effort with accessory muscle usage. No audible wheezing.  GI:  Truncal obesity.  Musculoskeletal:  Left distal femoral instability. Pulses are intact sensation  foot is intact.  Neurological: She is alert and oriented to person, place, and time.  Skin: Skin is warm and dry.  Psychiatric: She has a normal mood and affect. Her behavior is normal.  Thought content normal.   Patient's daughter at bedside and gave additional history. One point Florene Glen took care of been involved with her. She's been on home oxygen at time. Chronic C Pap usage. Assessment/Plan: Patient is at high risk for bleeding since she is on Eliquis. This will need to be held for surgery. I posterior for surgery for Monday afternoon. I discussed with her she is at risk for potential stroke stopping the Eliquis but already is anemic from chronic disease as well as from acute blood loss anemia from blood loss from the fracture site. Plan will be initial stabilization with an external fixator to bring out to length and then the lateral femoral plating for stabilization of the fracture. She is a risk for hardware failure due to soft bone with thin cortices and relative osteopenia on x-ray. Risks of heart attack, progressive kidney failure possible need for long-term dialysis, stroke and death are significant risk since she is at high risk due to her multiple medical problems. This was discussed with her in detail as well as her daughter she understands and requests to proceed. Due to her medical problems she will need the close medical management. My cell [phone]  Lisa [name] 08/02/2016, 10:40 PM

## 2016-08-02 NOTE — Progress Notes (Signed)
Orthopedic Tech Progress Note Patient Details:  Lisa [name] Aug 18, 1940 [id number]  Ortho Devices Type of Ortho Device: Knee Immobilizer Ortho Device/Splint Location: LLE Ortho Device/Splint Interventions: Ordered, Application   Lisa [name], Lisa [name] 08/02/2016, 10:59 PM

## 2016-08-02 NOTE — ED Triage Notes (Signed)
Per EMS, patient was walking to chair without walker and fell on left leg.  Left leg went under patient.  Patient complaining of left knee pain to left foot.  No LOC. No head trauma.  Patient is on blood thinners.  Hx of DM, Genella RifeGerd, Hypertension.  140/86, 106 HR, 18RR.

## 2016-08-02 NOTE — ED Provider Notes (Signed)
[facility]-EMERGENCY DEPT Provider Note   CSN: [id number] Arrival date & time: 08/02/16  1851     History   Chief Complaint Chief Complaint  Patient presents with  . Leg Pain    HPI Lisa [name] is a 76 y.o. female.  The history is provided by the patient.  Leg Pain   This is a new problem. The current episode started 1 to 2 hours ago. The problem occurs constantly. The problem has not changed since onset.The pain is present in the left lower leg and left knee. The quality of the pain is described as aching. The pain is at a severity of 7/10. The pain is moderate. Associated symptoms include limited range of motion. Pertinent negatives include no numbness and no tingling. The symptoms are aggravated by activity. She has tried nothing for the symptoms. The treatment provided no relief. There has been a history of trauma (Left leg went out from under her when family tried to get her up from bed).    Past Medical History:  Diagnosis Date  . Chronic diastolic CHF (congestive heart failure) (HCC)   . Coronary artery disease    s/p CABG  . Diabetes mellitus without complication (HCC)   . Hyperlipidemia   . Hypertension   . Morbid obesity (HCC)   . OSA (obstructive sleep apnea)    mild OSA with AHI 7.15 now on CPAP at 16cm H2O  . RBBB     Patient Active Problem List   Diagnosis Date Noted  . Hypokalemia 06/25/2016  . Goals of care, counseling/discussion   . DNR (do not resuscitate) discussion   . Palliative care encounter   . Chronic atrial fibrillation (HCC) 06/07/2016  . Chronic anticoagulation 06/07/2016  . Anemia, normocytic normochromic 06/07/2016  . Acute on chronic renal insufficiency 06/07/2016  . Pressure injury of skin 06/06/2016  . Sepsis (HCC) 06/03/2016  . Acute on chronic diastolic CHF (congestive heart failure) (HCC) 06/03/2016  . Acute on chronic respiratory failure with hypoxia (HCC) 06/03/2016  . Influenza-like illness   . PAF- DDCV Jan 2017 05/06/2016  .  Abdominal pain   . Chest pain, atypical 05/03/2016  . Bradycardia 01/29/2016  . Persistent atrial fibrillation (HCC) 04/24/2015  . Closed right ankle fracture 08/18/2014  . PVC (premature ventricular contraction) 03/22/2014  . SOB (shortness of breath) 09/22/2013  . Hx of CABG 05/06/2013  . Chronic diastolic CHF (congestive heart failure) (HCC) 05/06/2013  . OSA (obstructive sleep apnea) 05/06/2013  . Morbid obesity (HCC)   . Diastolic dysfunction   . Hyperlipidemia   . GERD (gastroesophageal reflux disease) 10/27/2010  . Gout 10/27/2010  . Hypertension 10/27/2010  . Asthma 10/27/2010  . Diabetes mellitus (HCC) 10/27/2010    Past Surgical History:  Procedure Laterality Date  . BACK SURGERY    . CARDIOVERSION N/A 08/03/2015   Procedure: CARDIOVERSION;  Surgeon: Vesta Mixer, MD;  Location: [facility] ENDOSCOPY;  Service: Cardiovascular;  Laterality: N/A;  . CORONARY ANGIOPLASTY WITH STENT PLACEMENT    . CORONARY ARTERY BYPASS GRAFT    . KNEE SURGERY Bilateral   . ORIF ANKLE FRACTURE Right 08/19/2014   Procedure: OPEN REDUCTION INTERNAL FIXATION (ORIF) ANKLE FRACTURE;  Surgeon: Cammy Copa, MD;  Location: [facility] OR;  Service: Orthopedics;  Laterality: Right;  . rotator cuff surgery      OB History    No data available       Home Medications    Prior to Admission medications   Medication Sig Start Date  End Date Taking? Authorizing Provider  amLODipine (NORVASC) 5 MG tablet Take 1 tablet (5 mg total) by mouth daily. 09/20/15   Quintella Reichert, MD  apixaban (ELIQUIS) 5 MG TABS tablet Take 1 tablet (5 mg total) by mouth 2 (two) times daily. 08/03/15   Quintella Reichert, MD  atorvastatin (LIPITOR) 10 MG tablet Take 1 tablet (10 mg total) by mouth daily at 6 PM. 05/08/16   Elease Etienne, MD  buPROPion (WELLBUTRIN XL) 150 MG 24 hr tablet Take 150 mg by mouth daily. Reported on 08/02/2015    Historical Provider, MD  esomeprazole (NEXIUM) 40 MG capsule Take 1 capsule (40 mg total) by mouth  daily before breakfast. 05/03/16   Quintella Reichert, MD  fluticasone (FLOVENT HFA) 220 MCG/ACT inhaler Inhale 2 puffs into the lungs 2 (two) times daily as needed (shortness of breath/ wheezing).    Historical Provider, MD  furosemide (LASIX) 80 MG tablet Take 1 tablet (80 mg total) by mouth daily. 06/13/16   Rhetta Mura, MD  gabapentin (NEURONTIN) 600 MG tablet Take 1 tablet (600 mg total) by mouth 2 (two) times daily. 05/08/16   Elease Etienne, MD  hydrALAZINE (APRESOLINE) 25 MG tablet Take 1 tablet (25 mg total) by mouth every 8 (eight) hours. 06/13/16   Rhetta Mura, MD  ipratropium-albuterol (DUONEB) 0.5-2.5 (3) MG/3ML SOLN Take 3 mLs by nebulization every 6 (six) hours as needed. Wheezing or shortness of breath. Patient taking differently: Take 3 mLs by nebulization every 6 (six) hours as needed (shortness of breath/ wheezing).  05/08/16   Elease Etienne, MD  isosorbide mononitrate (IMDUR) 30 MG 24 hr tablet Take 1 tablet (30 mg total) by mouth daily. 06/13/16   Rhetta Mura, MD  metoprolol succinate (TOPROL-XL) 50 MG 24 hr tablet Take 1 tablet (50 mg total) by mouth daily. Take with or immediately following a meal. 06/13/16   Rhetta Mura, MD  OXYGEN Inhale 2 L into the lungs continuous.    Historical Provider, MD  traMADol (ULTRAM) 50 MG tablet Take 50 mg by mouth every 12 (twelve) hours.    Historical Provider, MD    Family History Family History  Problem Relation Age of Onset  . Heart attack Mother   . Heart disease Mother   . Stomach cancer Father     Social History Social History  Substance Use Topics  . Smoking status: Former Games developer  . Smokeless tobacco: Never Used  . Alcohol use No     Allergies   Patient has no known allergies.   Review of Systems Review of Systems  Cardiovascular: Negative for chest pain.  Gastrointestinal: Negative for abdominal pain.  Musculoskeletal: Positive for arthralgias.  Neurological: Negative for dizziness,  tingling, weakness and numbness.  All other systems reviewed and are negative.    Physical Exam Updated Vital Signs BP 115/89 (BP Location: Right Wrist)   Pulse 107   Temp 97.4 F (36.3 C) (Oral)   Resp 16   Ht 5\' 4"  (1.626 m)   Wt (!) 320 lb (145.2 kg)   LMP  (LMP Unknown)   SpO2 94%   BMI 54.93 kg/m   Physical Exam  Constitutional: She is oriented to person, place, and time. She appears well-developed and well-nourished.  HENT:  Head: Normocephalic and atraumatic.  Eyes: Conjunctivae are normal. Right eye exhibits no discharge.  Neck: Neck supple.  Cardiovascular: Normal rate, regular rhythm and normal heart sounds.   No murmur heard. Pulmonary/Chest: Effort normal and breath  sounds normal. She has no wheezes. She has no rales.  Abdominal: Soft. She exhibits no distension. There is no tenderness.  Musculoskeletal: She exhibits no edema.  Pain superior and inferior to L knee, good rom to ankle and hip. Distal pulses and sensation intact.  Normal ROM to other extermities and no pain to neck, nor trauma to head  Neurological: She is oriented to person, place, and time. No cranial nerve deficit.  Skin: Skin is warm and dry. No rash noted. She is not diaphoretic.  Psychiatric: She has a normal mood and affect. Her behavior is normal.  Nursing note and vitals reviewed.    ED Treatments / Results  Labs (all labs ordered are listed, but only abnormal results are displayed) Labs Reviewed  CBC WITH DIFFERENTIAL/PLATELET - Abnormal; Notable for the following:       Result Value   WBC 13.1 (*)    RBC 2.47 (*)    Hemoglobin 7.2 (*)    HCT 23.0 (*)    RDW 18.8 (*)    Platelets 138 (*)    Neutro Abs 11.0 (*)    All other components within normal limits  COMPREHENSIVE METABOLIC PANEL - Abnormal; Notable for the following:    Potassium 5.5 (*)    CO2 14 (*)    Glucose, Bld 160 (*)    BUN 73 (*)    Creatinine, Ser 3.11 (*)    Calcium 8.5 (*)    Albumin 1.8 (*)    AST 58  (*)    Alkaline Phosphatase 181 (*)    Total Bilirubin 2.4 (*)    GFR calc non Af Amer 13 (*)    GFR calc Af Amer 16 (*)    All other components within normal limits  PROTIME-INR - Abnormal; Notable for the following:    Prothrombin Time 23.0 (*)    All other components within normal limits  TYPE AND SCREEN    EKG  EKG Interpretation  Date/Time:  Friday August 02 2016 22:00:57 EST Ventricular Rate:  109 PR Interval:    QRS Duration: 150 QT Interval:  385 QTC Calculation: 519 R Axis:   -71 Text Interpretation:  Sinus tachycardia 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  Preop. EXAM: CHEST 1 VIEW COMPARISON:  06/07/2016 and 05/04/2016 CXR FINDINGS: Low lung volumes as before. Status post CABG. Scarring in the right mid lung and left upper lobe. No pneumonic consolidation, effusion or pneumothorax. Osteoarthritis both glenohumeral joints. No acute fracture identified of the included ribs. IMPRESSION: No active disease.  Chronic right mid and left upper lobe scarring Electronically Signed   By: Tollie Eth M.D.   On: 08/02/2016 21:30   Dg Pelvis 1-2 Views  Result Date: 08/02/2016 CLINICAL DATA:  Pain after fall EXAM: PELVIS - 1-2 VIEW COMPARISON:  None. FINDINGS: There is no evidence of pelvic fracture or diastasis. Hip joints are maintained bilaterally. The included lumbar spine demonstrates lumbar spinal fusion hardware at L4 and L5. No pelvic bone lesions are seen. IMPRESSION: No acute pelvic fracture. No hip dislocation. No proximal femoral fracture is noted. Partially included lumbar fixation hardware noted along the lower lumbar spine to L5. Electronically Signed   By: Tollie Eth M.D.   On: 08/02/2016 21:26   Dg  Tibia/fibula Left  Result Date:  08/02/2016 CLINICAL DATA:  Left leg pain after fall EXAM: LEFT TIBIA AND FIBULA - 2 VIEW COMPARISON:  None. FINDINGS: There is a subtle oblique fracture of the distal diaphysis of the fibula just above the tibiotalar joint without definite extension into the ankle joint. Minimally widened appearance of the lateral tibiotalar joint in the craniocaudad axis to 6 mm. Supracondylar fracture of the femur is excluded on this study. The patient is status post total knee arthroplasty. There is arteriosclerosis along the tibial arteries. IMPRESSION: Subtle oblique closed fracture of the distal diaphysis of the fibula which appears to spare the ankle joint. Minimal widening along the lateral aspect of the tibiotalar joint in the craniocaudad axis up to 6 mm. Electronically Signed   By: Tollie Ethavid  Kwon M.D.   On: 08/02/2016 21:19   Dg Knee Complete 4 Views Left  Result Date: 08/02/2016 CLINICAL DATA:  Acute left knee pain after fall EXAM: LEFT KNEE - COMPLETE 4+ VIEW COMPARISON:  03/31/2014 FINDINGS: Acute, closed, comminuted supracondylar fracture of the left distal femoral diaphysis. There is dorsal displacement of the femoral condyles one shaft width and lateral displaced 1/2 shaft width relative to the femoral shaft. Lipohemarthrosis is noted. The patient is status post left knee arthroplasty. The fracture appears to spare the femoral condylar components. No loosening nor involvement of the tibial tray nor patella. No dislocation at the knee joint. Femoral and popliteal arteriosclerosis. IMPRESSION: Acute, closed, comminuted supracondylar fracture of the distal left femoral diaphysis sparing left knee arthroplasty components with dorsolateral displacement of the femoral condyles. Lipohemarthrosis. Electronically Signed   By: Tollie Ethavid  Kwon M.D.   On: 08/02/2016 21:14   Dg Femur Min 2 Views Left  Result Date: 08/02/2016 CLINICAL DATA:  Pain after fall EXAM: LEFT FEMUR 2 VIEWS COMPARISON:  None. FINDINGS: No acute fracture  about the left hip. The left hip joint is maintained. Left superior and inferior pubic rami appear intact. Acute, closed, comminuted supracondylar fracture of the distal femoral diaphysis sparing the femoral condylar component of a total knee arthroplasty. Small 4.1 x 1.7 x 1.5 cm butterfly fragment is noted dorsolaterally. There is dorsal and lateral displacement of the femoral condyles relative to the femoral shaft by 1/2 shaft width in both directions on these images. IMPRESSION: Acute, closed, comminuted supracondylar fracture of the left distal femoral diaphysis with 1/2 shaft with lateral and dorsal displacement of the femoral condyles relative to the femoral shaft. Electronically Signed   By: Tollie Ethavid  Kwon M.D.   On: 08/02/2016 21:23    Procedures Procedures (including critical care time)  Medications Ordered in ED Medications  morphine 4 MG/ML injection 3 mg (not administered)  fentaNYL (SUBLIMAZE) injection 50 mcg (50 mcg Intravenous Given 08/02/16 2129)     Initial Impression / Assessment and Plan / ED Course  I have reviewed the triage vital signs and the nursing notes.  Pertinent labs & imaging results that were available during my care of the patient were reviewed by me and considered in my medical decision making (see chart for details).     Pt is morbidly obese with CHF, CAD, OSA.  Isolated injury to LLE after fall. No external sign sof trauma.  Pt has pain to the knee. Hip appears normal on exam.   10:50 PM Xray shows femur fracture. Will call ortho.   Pt said Montez MoritaCarter put in the knee, but most recetly had surgery by Dr. August Saucerean (R ankle fracture) in 2016  10:50 PM Seen  by ortho, Dr. Ophelia Charter. Recommendation to admit to medicine, medically optimize for potential surgery next week.   Final Clinical Impressions(s) / ED Diagnoses   Final diagnoses:  Fall    New Prescriptions New Prescriptions   No medications on file     Benigna Delisi Randall An, MD 08/02/16 2250

## 2016-08-02 NOTE — ED Notes (Signed)
Two IV attempts made second RN to bedside for IV attempt and blood draw

## 2016-08-02 NOTE — ED Notes (Signed)
Nurse starting IV and will get labs. 

## 2016-08-02 NOTE — H&P (Signed)
History and Physical    Lisa [name] WUJ:[id number] DOB: 1940-10-21 DOA: 08/02/2016  PCP: Laurena Slimmer, MD   Patient coming from: SNF   Chief Complaint: Fall with left leg pain   HPI: Lisa [name] is a 76 y.o. female with medical history significant for atrial fibrillation on Eliquis, hypertension, CAD status post CABG, COPD with chronic respiratory failure, OSA, and chronic diastolic CHF who presents to the emergency department from her nursing facility for evaluation of severe left leg pain following a fall. Patient reports that she been in her usual state of health until suffering a mechanical ground-level fall while attempting to transfer from one chair to another. She describes falling onto her left side and experiencing immediate pain at the left knee and left foot regions. She denies striking her head or losing consciousness. She denies any recent fevers or chills and denies any cough or dyspnea. She also denies chest pain or palpitations. Patient describes her pain as severe, sharp, localized to just below the left knee and just above the left ankle, worse with any movement, and with no alleviating factors identified.   ED Course: Upon arrival to the ED, patient is found to be afebrile, saturating adequately on room air, with mildly tachycardic, and with vitals otherwise stable. EKG features a sinus tachycardia with rate 109, chronic right bundle branch block, and left anterior fascicular block. Chest x-ray is negative for acute cardiopulmonary disease. Chemistry panels notable for a potassium of 5.5, BUN 73, and serum creatinine of 3.11, up from an apparent baseline of 1.3. CMP is also notable for a bilirubin of 2.4 and AST of 58. CBC features a leukocytosis to 13,100 and a normocytic anemia with hemoglobin of 7.2. Platelets are slightly low at 138,000. INR is elevated to 2.0. Radiographs of the left femur demonstrate acute closed, comminuted supracondylar fracture of the distal left  femoral diaphysis sparing the left knee arthroplasty components. Radiographs also reveal a subtle oblique fracture involving the distal diaphysis of the left fibula. Radiographs are negative for pelvic or hip fracture or dislocation. Orthopedic surgery was consulted by the ED physician, evaluated the patient in the emergency department, and has advised a medical admission with tentative plans for surgery in the coming days. Patient was given 500 mL of normal saline and IV morphine. She will be admitted to the telemetry unit for ongoing evaluation and management of acute left supracondylar femur fracture and left fibular fracture with significant underlying comorbidity.   Review of Systems:  All other systems reviewed and apart from HPI, are negative.  Past Medical History:  Diagnosis Date  . Chronic diastolic CHF (congestive heart failure) (HCC)   . Coronary artery disease    s/p CABG  . Diabetes mellitus without complication (HCC)   . Hyperlipidemia   . Hypertension   . Morbid obesity (HCC)   . OSA (obstructive sleep apnea)    mild OSA with AHI 7.15 now on CPAP at 16cm H2O  . RBBB     Past Surgical History:  Procedure Laterality Date  . BACK SURGERY    . CARDIOVERSION N/A 08/03/2015   Procedure: CARDIOVERSION;  Surgeon: Vesta Mixer, MD;  Location: [facility] ENDOSCOPY;  Service: Cardiovascular;  Laterality: N/A;  . CORONARY ANGIOPLASTY WITH STENT PLACEMENT    . CORONARY ARTERY BYPASS GRAFT    . KNEE SURGERY Bilateral   . ORIF ANKLE FRACTURE Right 08/19/2014   Procedure: OPEN REDUCTION INTERNAL FIXATION (ORIF) ANKLE FRACTURE;  Surgeon: Cammy Copa, MD;  Location: [facility] OR;  Service: Orthopedics;  Laterality: Right;  . rotator cuff surgery       reports that she has quit smoking. She has never used smokeless tobacco. She reports that she does not drink alcohol or use drugs.  No Known Allergies  Family History  Problem Relation Age of Onset  . Heart attack Mother   . Heart disease  Mother   . Stomach cancer Father      Prior to Admission medications   Medication Sig Start Date End Date Taking? Authorizing Provider  amLODipine (NORVASC) 5 MG tablet Take 1 tablet (5 mg total) by mouth daily. 09/20/15   Quintella Reichert, MD  apixaban (ELIQUIS) 5 MG TABS tablet Take 1 tablet (5 mg total) by mouth 2 (two) times daily. 08/03/15   Quintella Reichert, MD  atorvastatin (LIPITOR) 10 MG tablet Take 1 tablet (10 mg total) by mouth daily at 6 PM. 05/08/16   Elease Etienne, MD  buPROPion (WELLBUTRIN XL) 150 MG 24 hr tablet Take 150 mg by mouth daily. Reported on 08/02/2015    Historical Provider, MD  esomeprazole (NEXIUM) 40 MG capsule Take 1 capsule (40 mg total) by mouth daily before breakfast. 05/03/16   Quintella Reichert, MD  fluticasone (FLOVENT HFA) 220 MCG/ACT inhaler Inhale 2 puffs into the lungs 2 (two) times daily as needed (shortness of breath/ wheezing).    Historical Provider, MD  furosemide (LASIX) 80 MG tablet Take 1 tablet (80 mg total) by mouth daily. 06/13/16   Rhetta Mura, MD  gabapentin (NEURONTIN) 600 MG tablet Take 1 tablet (600 mg total) by mouth 2 (two) times daily. 05/08/16   Elease Etienne, MD  hydrALAZINE (APRESOLINE) 25 MG tablet Take 1 tablet (25 mg total) by mouth every 8 (eight) hours. 06/13/16   Rhetta Mura, MD  ipratropium-albuterol (DUONEB) 0.5-2.5 (3) MG/3ML SOLN Take 3 mLs by nebulization every 6 (six) hours as needed. Wheezing or shortness of breath. Patient taking differently: Take 3 mLs by nebulization every 6 (six) hours as needed (shortness of breath/ wheezing).  05/08/16   Elease Etienne, MD  isosorbide mononitrate (IMDUR) 30 MG 24 hr tablet Take 1 tablet (30 mg total) by mouth daily. 06/13/16   Rhetta Mura, MD  metoprolol succinate (TOPROL-XL) 50 MG 24 hr tablet Take 1 tablet (50 mg total) by mouth daily. Take with or immediately following a meal. 06/13/16   Rhetta Mura, MD  OXYGEN Inhale 2 L into the lungs continuous.     Historical Provider, MD  traMADol (ULTRAM) 50 MG tablet Take 50 mg by mouth every 12 (twelve) hours.    Historical Provider, MD    Physical Exam: Vitals:   08/02/16 1854 08/02/16 1857 08/02/16 2215  BP:  115/89 127/96  Pulse:  107   Resp:  16 15  Temp:  97.4 F (36.3 C)   TempSrc:  Oral   SpO2:  94%   Weight: (!) 145.2 kg (320 lb)    Height: 5\' 4"  (1.626 m)        Constitutional: No respiratory distress, calm, in apparent discomfort  Eyes: PERTLA, lids and conjunctivae normal ENMT: Mucous membranes are moist. Posterior pharynx clear of any exudate or lesions.   Neck: normal, supple, no masses, no thyromegaly Respiratory: clear to auscultation bilaterally, no wheezing, no crackles. Normal respiratory effort.   Cardiovascular: Rate ~110 and regular with soft systolic murmur at apex. No significant JVD. Abdomen: No distension, no tenderness, no masses palpated. Bowel sounds normal.  Musculoskeletal: no clubbing / cyanosis. Left leg exquisitely tender proximal to knee and ankle; sensation to light touch and cap refill is intact in left in foot. Skin: Hyperpigmentation to distal BLE's in gaiter distribution. Warm, dry, well-perfused. Poor turgor.  Neurologic: CN 2-12 grossly intact. Sensation intact, DTR normal.    Psychiatric: Normal judgment and insight. Alert and oriented x 3. Normal mood and affect.     Labs on Admission: I have personally reviewed following labs and imaging studies  CBC:  Recent Labs Lab 08/02/16 2144  WBC 13.1*  NEUTROABS 11.0*  HGB 7.2*  HCT 23.0*  MCV 93.1  PLT 138*   Basic Metabolic Panel:  Recent Labs Lab 08/02/16 2144  NA 135  K 5.5*  CL 111  CO2 14*  GLUCOSE 160*  BUN 73*  CREATININE 3.11*  CALCIUM 8.5*   GFR: Estimated Creatinine Clearance: 21.7 mL/min (by C-G formula based on SCr of 3.11 mg/dL (H)). Liver Function Tests:  Recent Labs Lab 08/02/16 2144  AST 58*  ALT 39  ALKPHOS 181*  BILITOT 2.4*  PROT 7.1  ALBUMIN  1.8*   No results for input(s): LIPASE, AMYLASE in the last 168 hours. No results for input(s): AMMONIA in the last 168 hours. Coagulation Profile:  Recent Labs Lab 08/02/16 2144  INR 2.00   Cardiac Enzymes: No results for input(s): CKTOTAL, CKMB, CKMBINDEX, TROPONINI in the last 168 hours. BNP (last 3 results) No results for input(s): PROBNP in the last 8760 hours. HbA1C: No results for input(s): HGBA1C in the last 72 hours. CBG: No results for input(s): GLUCAP in the last 168 hours. Lipid Profile: No results for input(s): CHOL, HDL, LDLCALC, TRIG, CHOLHDL, LDLDIRECT in the last 72 hours. Thyroid Function Tests: No results for input(s): TSH, T4TOTAL, FREET4, T3FREE, THYROIDAB in the last 72 hours. Anemia Panel: No results for input(s): VITAMINB12, FOLATE, FERRITIN, TIBC, IRON, RETICCTPCT in the last 72 hours. Urine analysis:    Component Value Date/Time   COLORURINE YELLOW 06/02/2016 2235   APPEARANCEUR CLEAR 06/02/2016 2235   LABSPEC 1.012 06/02/2016 2235   PHURINE 7.5 06/02/2016 2235   GLUCOSEU NEGATIVE 06/02/2016 2235   HGBUR SMALL (A) 06/02/2016 2235   BILIRUBINUR NEGATIVE 06/02/2016 2235   KETONESUR NEGATIVE 06/02/2016 2235   PROTEINUR 100 (A) 06/02/2016 2235   UROBILINOGEN 1.0 08/20/2014 1130   NITRITE NEGATIVE 06/02/2016 2235   LEUKOCYTESUR NEGATIVE 06/02/2016 2235   Sepsis Labs: @LABRCNTIP (procalcitonin:4,lacticidven:4) )No results found for this or any previous visit (from the past 240 hour(s)).   Radiological Exams on Admission: Dg Chest 1 View  Result Date: 08/02/2016 CLINICAL DATA:  Pain after fall.  Preop. EXAM: CHEST 1 VIEW COMPARISON:  06/07/2016 and 05/04/2016 CXR FINDINGS: Low lung volumes as before. Status post CABG. Scarring in the right mid lung and left upper lobe. No pneumonic consolidation, effusion or pneumothorax. Osteoarthritis both glenohumeral joints. No acute fracture identified of the included ribs. IMPRESSION: No active disease.  Chronic  right mid and left upper lobe scarring Electronically Signed   By: Tollie Eth M.D.   On: 08/02/2016 21:30   Dg Pelvis 1-2 Views  Result Date: 08/02/2016 CLINICAL DATA:  Pain after fall EXAM: PELVIS - 1-2 VIEW COMPARISON:  None. FINDINGS: There is no evidence of pelvic fracture or diastasis. Hip joints are maintained bilaterally. The included lumbar spine demonstrates lumbar spinal fusion hardware at L4 and L5. No pelvic bone lesions are seen. IMPRESSION: No acute pelvic fracture. No hip dislocation. No proximal femoral fracture is noted. Partially included  lumbar fixation hardware noted along the lower lumbar spine to L5. Electronically Signed   By: Tollie Ethavid  Kwon M.D.   On: 08/02/2016 21:26   Dg Tibia/fibula Left  Result Date: 08/02/2016 CLINICAL DATA:  Left leg pain after fall EXAM: LEFT TIBIA AND FIBULA - 2 VIEW COMPARISON:  None. FINDINGS: There is a subtle oblique fracture of the distal diaphysis of the fibula just above the tibiotalar joint without definite extension into the ankle joint. Minimally widened appearance of the lateral tibiotalar joint in the craniocaudad axis to 6 mm. Supracondylar fracture of the femur is excluded on this study. The patient is status post total knee arthroplasty. There is arteriosclerosis along the tibial arteries. IMPRESSION: Subtle oblique closed fracture of the distal diaphysis of the fibula which appears to spare the ankle joint. Minimal widening along the lateral aspect of the tibiotalar joint in the craniocaudad axis up to 6 mm. Electronically Signed   By: Tollie Ethavid  Kwon M.D.   On: 08/02/2016 21:19   Dg Knee Complete 4 Views Left  Result Date: 08/02/2016 CLINICAL DATA:  Acute left knee pain after fall EXAM: LEFT KNEE - COMPLETE 4+ VIEW COMPARISON:  03/31/2014 FINDINGS: Acute, closed, comminuted supracondylar fracture of the left distal femoral diaphysis. There is dorsal displacement of the femoral condyles one shaft width and lateral displaced 1/2 shaft width  relative to the femoral shaft. Lipohemarthrosis is noted. The patient is status post left knee arthroplasty. The fracture appears to spare the femoral condylar components. No loosening nor involvement of the tibial tray nor patella. No dislocation at the knee joint. Femoral and popliteal arteriosclerosis. IMPRESSION: Acute, closed, comminuted supracondylar fracture of the distal left femoral diaphysis sparing left knee arthroplasty components with dorsolateral displacement of the femoral condyles. Lipohemarthrosis. Electronically Signed   By: Tollie Ethavid  Kwon M.D.   On: 08/02/2016 21:14   Dg Femur Min 2 Views Left  Result Date: 08/02/2016 CLINICAL DATA:  Pain after fall EXAM: LEFT FEMUR 2 VIEWS COMPARISON:  None. FINDINGS: No acute fracture about the left hip. The left hip joint is maintained. Left superior and inferior pubic rami appear intact. Acute, closed, comminuted supracondylar fracture of the distal femoral diaphysis sparing the femoral condylar component of a total knee arthroplasty. Small 4.1 x 1.7 x 1.5 cm butterfly fragment is noted dorsolaterally. There is dorsal and lateral displacement of the femoral condyles relative to the femoral shaft by 1/2 shaft width in both directions on these images. IMPRESSION: Acute, closed, comminuted supracondylar fracture of the left distal femoral diaphysis with 1/2 shaft with lateral and dorsal displacement of the femoral condyles relative to the femoral shaft. Electronically Signed   By: Tollie Ethavid  Kwon M.D.   On: 08/02/2016 21:23    EKG: Independently reviewed. Sinus tachycardia (rate 109), chronic RBBB, LAFB   Assessment/Plan  1. Acute left femur fracture, left fibula fracture  - Pt suffered ground-level mechanical fall onto left side just PTA; no head-strike or LOC  - Radiographs reveal fractures of supracondylar left femur and left fibula  - Orthopedic surgery is consulting and much appreciated; medical admission was requested for preoperative optimization    - Unfortunately, pt presents a high-risk for perioperative mortality, with risk of perioperative cardiac complications 3-4% per Amie CritchleyGupta, et.al. - Pt reportedly underwent outpatient stress-testing on 07/10/16, but results not visible in chart; will need this information prior to surgery  - Preoperative concerns include severe chronic anemia, CHF with recent exacerbation, AKI superimposed on CKD stage III, COPD with chronic resp failure, and new  hyperbilirubinemia and possible coagulopathy; these are addressed below    2. AKI superimposed on CKD stage III  - SCr is 3.11 on admission, up from apparent baseline of 1.3  - Possibly a prerenal azotemia as pt appears intravascularly depleted on admission  - She was given 500 cc NS in ED and will be continued on a gentle IVF hydration, mindful of her CHF with recent exacerbation  - Lasix held on admission  - Obtain renal US and urine studies    3. Normocytic anemia  - Hgb is 7.2 on admission; recent priors have been in 7-9 range  - She was anemic during admission last month and received 1 unit pRBCs on 06/09/16 - Pt denies melena or hematochezia; imaging suggests some bleeding at her femoral fracture  - Check stool for occult blood  - Type and screen has been performed; will likely need RBCs with surgery    4. Paroxysmal atrial fibrillation - In a sinus rhythm on admission  - CHADS-VASc at least 5 (age x2, gender, CHF, HTN) - She is anticoagulated with Eliquis; this is held on admission in preparation for surgery; given the anemia and apparent bleeding at fracture site, will hold [facility] for now, check FOBT as above  - Continue metoprolol as tolerated   5. Chronic diastolic CHF  - Pt appears intravascularly depleted on admission  - TTE (05/05/16) with EF 50-55%, moderate LVH, grade 1 diastolic dysfunction, moderate LAE, mild-mod TR, and moderate increase in PA pressures  - Lasix held on admission in light of AKI and apparent dehydration  - She is being  gently hydrated  - Follow strict I/O's and daily wts (admission wt appears to be grossly inaccurate); was 228 lb on 06/20/16 cardiology office visit and apparently euvolemic then  - Continue beta-blocker as tolerated   6. CAD  - No anginal complaints on admission  - Pt is s/p CABG and follows with cardiology in outpatient setting  - Pt had outpatient stress-testing on 07/10/16, but results not visible in chart; may need to contact cardiology for results   - Monitor on telemetry, continue Lipitor, Imdur, and Toprol  7. Hyperbilirubinemia, coagulopathy - Bilirubin elevated to 2.4, INR elevated to 2.0  - There is no abd pain and no N/V  - Check RUQ [place], fractionate bilirubin  8. COPD, OSA, chronic respiratory failure  - Pt uses 2 Lpm supplemental oxygen at baseline  - Respiratory status appears to be stable on admission  - Continue supplemental O2, CPAP qHS    9. Hypertension - BP at goal on admission  - Continue Toprol, Norvasc, and hydralazine as tolerated    DVT prophylaxis: right SCD, coagulopathic on admission  Code Status: Full  Family Communication: Daughter updated at bedside Disposition Plan: Admit to telemetry Consults called: Orthopedic surgery Admission status: Inpatient    Briscoe Deutscher, MD [facility] Pager [phone]  If 7PM-7AM, please contact night-coverage [email or web address] Password [facility]  08/02/2016, 11:41 PM

## 2016-08-02 NOTE — Patient Outreach (Signed)
[facility] Care Management  [facility]  08/02/2016  [name] Throgmorton 1940/08/28 [id number]  Subjective:   Patient is a [age] year olf female who [facility]. This social worker arrived to find patient had discharged on 07/27/16.  Per discharge planner Marcille BuffySholonda Dickerson, [facility], OT, and bath aid) . Patient discharged with a hospital bed, O2, wheelchair and a 3 and 1 bedside commode. Objective:   Encounter Medications:  Outpatient Encounter Prescriptions as of 08/02/2016  Medication Sig  . amLODipine (NORVASC) 5 MG tablet Take 1 tablet (5 mg total) by mouth daily.  Marland Kitchen. apixaban (ELIQUIS) 5 MG TABS tablet Take 1 tablet (5 mg total) by mouth 2 (two) times daily.  Marland Kitchen. atorvastatin (LIPITOR) 10 MG tablet Take 1 tablet (10 mg total) by mouth daily at 6 PM.  . buPROPion (WELLBUTRIN XL) 150 MG 24 hr tablet Take 150 mg by mouth daily. Reported on 08/02/2015  . esomeprazole (NEXIUM) 40 MG capsule Take 1 capsule (40 mg total) by mouth daily before breakfast.  . fluticasone (FLOVENT HFA) 220 MCG/ACT inhaler Inhale 2 puffs into the lungs 2 (two) times daily as needed (shortness of breath/ wheezing).  . furosemide (LASIX) 80 MG tablet Take 1 tablet (80 mg total) by mouth daily.  Marland Kitchen. gabapentin (NEURONTIN) 600 MG tablet Take 1 tablet (600 mg total) by mouth 2 (two) times daily.  . hydrALAZINE (APRESOLINE) 25 MG tablet Take 1 tablet (25 mg total) by mouth every 8 (eight) hours.  Marland Kitchen. ipratropium-albuterol (DUONEB) 0.5-2.5 (3) MG/3ML SOLN Take 3 mLs by nebulization every 6 (six) hours as needed. Wheezing or shortness of breath. (Patient taking differently: Take 3 mLs by nebulization every 6 (six) hours as needed (shortness of breath/ wheezing). )  . isosorbide mononitrate (IMDUR) 30 MG 24 hr tablet Take 1 tablet (30 mg total) by mouth daily.  . metoprolol succinate (TOPROL-XL) 50 MG 24 hr tablet Take 1  tablet (50 mg total) by mouth daily. Take with or immediately following a meal.  . OXYGEN Inhale 2 L into the lungs continuous.  . traMADol (ULTRAM) 50 MG tablet Take 50 mg by mouth every 12 (twelve) hours.   No facility-administered encounter medications on file as of 08/02/2016.     Functional Status:  In your present state of health, do you have any difficulty performing the following activities: 06/17/2016 06/03/2016  Hearing? N -  Vision? N -  Difficulty concentrating or making decisions? N -  Walking or climbing stairs? Y -  Dressing or bathing? Y Y  Doing errands, shopping? Y -  Quarry managerreparing Food and eating ? Y -  Using the Toilet? Y -  In the past six months, have you accidently leaked urine? Y -  Do you have problems with loss of bowel control? Y -  Managing your Medications? N -  Managing your Finances? N -  Housekeeping or managing your Housekeeping? N -  Some recent data might be hidden    Fall/Depression Screening:  PHQ 2/9 Scores 06/17/2016 10/26/2015  PHQ - 2 Score 1 6  PHQ- 9 Score - 16    Assessment:  Patient discharged home on 07/27/16 with HH and DME.   Plan:  This Child psychotherapistsocial worker will refer patient to community RNCM for transition of care             This social worker will follow up with patient at home to assess for continued  social work              Needs.   Adriana Reams [facility] Care Management [phone]

## 2016-08-03 ENCOUNTER — Inpatient Hospital Stay (HOSPITAL_COMMUNITY): Payer: Medicare Other

## 2016-08-03 ENCOUNTER — Encounter (HOSPITAL_COMMUNITY): Payer: Self-pay | Admitting: Family Medicine

## 2016-08-03 DIAGNOSIS — E875 Hyperkalemia: Secondary | ICD-10-CM

## 2016-08-03 LAB — COMPREHENSIVE METABOLIC PANEL
ALT: 40 U/L (ref 14–54)
AST: 56 U/L — AB (ref 15–41)
Albumin: 1.6 g/dL — ABNORMAL LOW (ref 3.5–5.0)
Alkaline Phosphatase: 181 U/L — ABNORMAL HIGH (ref 38–126)
Anion gap: 7 (ref 5–15)
BILIRUBIN TOTAL: 2.7 mg/dL — AB (ref 0.3–1.2)
BUN: 69 mg/dL — AB (ref 6–20)
CO2: 17 mmol/L — ABNORMAL LOW (ref 22–32)
Calcium: 8.3 mg/dL — ABNORMAL LOW (ref 8.9–10.3)
Chloride: 112 mmol/L — ABNORMAL HIGH (ref 101–111)
Creatinine, Ser: 2.94 mg/dL — ABNORMAL HIGH (ref 0.44–1.00)
GFR, EST AFRICAN AMERICAN: 17 mL/min — AB (ref >60)
GFR, EST NON AFRICAN AMERICAN: 14 mL/min — AB (ref >60)
Glucose, Bld: 160 mg/dL — ABNORMAL HIGH (ref 65–99)
POTASSIUM: 5.7 mmol/L — AB (ref 3.5–5.1)
Sodium: 136 mmol/L (ref 135–145)
TOTAL PROTEIN: 6.6 g/dL (ref 6.5–8.1)

## 2016-08-03 LAB — IRON AND TIBC
Iron: 35 ug/dL (ref 28–170)
Saturation Ratios: 20 % (ref 10.4–31.8)
TIBC: 175 ug/dL — AB (ref 250–450)
UIBC: 140 ug/dL

## 2016-08-03 LAB — URINALYSIS, ROUTINE W REFLEX MICROSCOPIC
BILIRUBIN URINE: NEGATIVE
Glucose, UA: NEGATIVE mg/dL
HGB URINE DIPSTICK: NEGATIVE
Ketones, ur: NEGATIVE mg/dL
Nitrite: NEGATIVE
PROTEIN: NEGATIVE mg/dL
Specific Gravity, Urine: 1.012 (ref 1.005–1.030)
pH: 5 (ref 5.0–8.0)

## 2016-08-03 LAB — RETICULOCYTES
RBC.: 1.57 MIL/uL — AB (ref 3.87–5.11)
RETIC CT PCT: 3.8 % — AB (ref 0.4–3.1)
Retic Count, Absolute: 59.7 10*3/uL (ref 19.0–186.0)

## 2016-08-03 LAB — CBC WITH DIFFERENTIAL/PLATELET
BASOS ABS: 0.1 10*3/uL (ref 0.0–0.1)
BASOS PCT: 1 %
EOS ABS: 0.1 10*3/uL (ref 0.0–0.7)
EOS PCT: 1 %
HCT: 20 % — ABNORMAL LOW (ref 36.0–46.0)
Hemoglobin: 6.3 g/dL — CL (ref 12.0–15.0)
Lymphocytes Relative: 17 %
Lymphs Abs: 2.2 10*3/uL (ref 0.7–4.0)
MCH: 29.3 pg (ref 26.0–34.0)
MCHC: 31.5 g/dL (ref 30.0–36.0)
MCV: 93 fL (ref 78.0–100.0)
MONO ABS: 0.8 10*3/uL (ref 0.1–1.0)
Monocytes Relative: 6 %
Neutro Abs: 9.8 10*3/uL — ABNORMAL HIGH (ref 1.7–7.7)
Neutrophils Relative %: 76 %
PLATELETS: 121 10*3/uL — AB (ref 150–400)
RBC: 2.15 MIL/uL — ABNORMAL LOW (ref 3.87–5.11)
RDW: 18.5 % — AB (ref 11.5–15.5)
WBC: 12.9 10*3/uL — ABNORMAL HIGH (ref 4.0–10.5)

## 2016-08-03 LAB — GLUCOSE, CAPILLARY: GLUCOSE-CAPILLARY: 130 mg/dL — AB (ref 65–99)

## 2016-08-03 LAB — POTASSIUM
POTASSIUM: 5.3 mmol/L — AB (ref 3.5–5.1)
POTASSIUM: 5.6 mmol/L — AB (ref 3.5–5.1)
POTASSIUM: 6 mmol/L — AB (ref 3.5–5.1)

## 2016-08-03 LAB — SODIUM, URINE, RANDOM: Sodium, Ur: 27 mmol/L

## 2016-08-03 LAB — PREPARE RBC (CROSSMATCH)

## 2016-08-03 LAB — BRAIN NATRIURETIC PEPTIDE: B Natriuretic Peptide: 500.3 pg/mL — ABNORMAL HIGH (ref 0.0–100.0)

## 2016-08-03 LAB — MRSA PCR SCREENING: MRSA BY PCR: NEGATIVE

## 2016-08-03 LAB — CREATININE, URINE, RANDOM: CREATININE, URINE: 75.97 mg/dL

## 2016-08-03 LAB — FOLATE: FOLATE: 10.3 ng/mL (ref >5.9)

## 2016-08-03 LAB — PROTIME-INR
INR: 1.99
PROTHROMBIN TIME: 22.9 s — AB (ref 11.4–15.2)

## 2016-08-03 LAB — VITAMIN B12: VITAMIN B 12: 615 pg/mL (ref 180–914)

## 2016-08-03 LAB — FERRITIN: Ferritin: 1086 ng/mL — ABNORMAL HIGH (ref 11–307)

## 2016-08-03 MED ORDER — SODIUM CHLORIDE 0.9 % IV SOLN
Freq: Once | INTRAVENOUS | Status: AC
  Administered 2016-08-03: 09:00:00 via INTRAVENOUS

## 2016-08-03 MED ORDER — DEXTROSE 5 % IV SOLN
3.0000 g | INTRAVENOUS | Status: DC
  Filled 2016-08-03: qty 3000

## 2016-08-03 MED ORDER — POVIDONE-IODINE 10 % EX SWAB: 2.0000 "application " | Freq: Once | CUTANEOUS | Status: DC

## 2016-08-03 MED ORDER — CHLORHEXIDINE GLUCONATE 4 % EX LIQD
60.0000 mL | Freq: Once | CUTANEOUS | Status: AC
  Administered 2016-08-05: 4 via TOPICAL
  Filled 2016-08-03: qty 60

## 2016-08-03 MED ORDER — BUDESONIDE 0.5 MG/2ML IN SUSP
1.0000 mg | Freq: Two times a day (BID) | RESPIRATORY_TRACT | Status: DC
  Administered 2016-08-03 – 2016-08-10 (×12): 1 mg via RESPIRATORY_TRACT
  Filled 2016-08-03 (×14): qty 4

## 2016-08-03 MED ORDER — SODIUM POLYSTYRENE SULFONATE 15 GM/60ML PO SUSP
15.0000 g | Freq: Once | ORAL | Status: AC
  Administered 2016-08-03: 15 g via ORAL
  Filled 2016-08-03: qty 60

## 2016-08-03 MED ORDER — FUROSEMIDE 10 MG/ML IJ SOLN
20.0000 mg | Freq: Once | INTRAMUSCULAR | Status: AC
  Administered 2016-08-03: 20 mg via INTRAVENOUS
  Filled 2016-08-03: qty 2

## 2016-08-03 NOTE — Consult Note (Signed)
Cardiology Consult Note  Admit date: 08/02/2016 Name: Lisa [name] 76 y.o.  female DOB:  06-12-1941 MRN:  [id number]  Today's date:  08/03/2016  Referring Physician:    [facility]  Primary Physician:    Chestine Spore  Reason for Consultation:    Preoperative cardiac evaluation   IMPRESSIONS: 1.  From a cardiovascular viewpoint may proceed with the planned orthopedic operation although she needs to have attention done to anemia and renal failure and will reassess again tomorrow.  No additional cardiovascular workup is necessary.  She is at high risk for complications from the procedure due to multiple comorbidities 2.  CAD with previous bypass grafting in 2005 3.  Chronic diastolic congestive heart failure 4.  Diabetes mellitus 5.  Hypertensive heart disease 6.  Obstructive sleep apnea 7.  Morbid obesity 8.  Anemia due to blood loss 9.  Chronic atrial fibrillation currently rate controlled 10.  Acute on chronic kidney disease  RECOMMENDATION: Needs transfusion and correction of her other metabolic abnormalities.  Her renal function has deteriorated.  She is not clinically volume overloaded at this time but is at risk for this with her hypertensive heart disease.  Will need to be careful with volume status around the time of surgery.  HISTORY: This 76 year old female has a prior history of coronary artery disease with bypass grafting in 2005.  She has intermittent chest pain the last being several days ago.  She also has a history of atrial fibrillation which is now chronic and has been anticoagulated.  She had an admission in December with influenza-like illness and had heart failure due to diastolic dysfunction that needed to be diuresed. She is a DO NOT RESUSCITATE.  She had a mechanical fall fracturing her femur as well as her tibia.  She has been found to be significantly anemic and has been transfused.  Her hemoglobin was 6.6 this morning.  She had some chest pain several days ago  and is somewhat vague in his description of it.  Is not normally limited with angina.  She denies PND or orthopnea.  She has poor exercise capacity at home and basically leads a sedentary existence.  Past Medical History:  Diagnosis Date  . Chronic diastolic CHF (congestive heart failure) (HCC)   . Coronary artery disease    s/p CABG  . Diabetes mellitus without complication (HCC)   . Hyperlipidemia   . Hypertension   . Morbid obesity (HCC)   . OSA (obstructive sleep apnea)    mild OSA with AHI 7.15 now on CPAP at 16cm H2O  . RBBB       Past Surgical History:  Procedure Laterality Date  . BACK SURGERY    . CARDIOVERSION N/A 08/03/2015   Procedure: CARDIOVERSION;  Surgeon: Vesta Mixer, MD;  Location[facility] ENDOSCOPY;  Service: Cardiovascular;  Laterality: N/A;  . CORONARY ANGIOPLASTY WITH STENT PLACEMENT    . CORONARY ARTERY BYPASS GRAFT    . KNEE SURGERY Bilateral   . ORIF ANKLE FRACTURE Right 08/19/2014   Procedure: OPEN REDUCTION INTERNAL FIXATION (ORIF) ANKLE FRACTURE;  Surgeon: Cammy Copa, MD;  Location: [facility] OR;  Service: Orthopedics;  Laterality: Right;  . rotator cuff surgery       Allergies:  has No Known Allergies.   Medications: Prior to Admission medications   Medication Sig Start Date End Date Taking? Authorizing Provider  amLODipine (NORVASC) 5 MG tablet Take 1 tablet (5 mg total) by mouth daily. 09/20/15  Yes Quintella Reichert, MD  apixaban (ELIQUIS) 5 MG TABS tablet Take 1 tablet (5 mg total) by mouth 2 (two) times daily. 08/03/15  Yes Quintella Reichertraci R Turner, MD  atorvastatin (LIPITOR) 10 MG tablet Take 1 tablet (10 mg total) by mouth daily at 6 PM. 05/08/16  Yes Elease EtienneAnand D Hongalgi, MD  buPROPion (WELLBUTRIN XL) 150 MG 24 hr tablet Take 150 mg by mouth daily. Reported on 08/02/2015   Yes Historical Provider, MD  fluticasone (FLOVENT HFA) 220 MCG/ACT inhaler Inhale 2 puffs into the lungs 2 (two) times daily as needed (shortness of breath/ wheezing).   Yes Historical Provider,  MD  hydrALAZINE (APRESOLINE) 25 MG tablet Take 1 tablet (25 mg total) by mouth every 8 (eight) hours. 06/13/16  Yes Rhetta MuraJai-Gurmukh Samtani, MD  ipratropium-albuterol (DUONEB) 0.5-2.5 (3) MG/3ML SOLN Take 3 mLs by nebulization every 6 (six) hours as needed. Wheezing or shortness of breath. Patient taking differently: Take 3 mLs by nebulization every 6 (six) hours as needed (shortness of breath/ wheezing).  05/08/16  Yes Elease EtienneAnand D Hongalgi, MD  metolazone (ZAROXOLYN) 2.5 MG tablet Take 2.5 mg by mouth every Monday, Wednesday, and Friday. 07/28/16  Yes Historical Provider, MD  metoprolol succinate (TOPROL-XL) 50 MG 24 hr tablet Take 1 tablet (50 mg total) by mouth daily. Take with or immediately following a meal. 06/13/16  Yes Rhetta MuraJai-Gurmukh Samtani, MD  mirtazapine (REMERON) 7.5 MG tablet Take 7.5 mg by mouth at bedtime. 07/28/16  Yes Historical Provider, MD  omeprazole (PRILOSEC) 20 MG capsule Take 20 mg by mouth daily. 07/28/16  Yes Historical Provider, MD  OXYGEN Inhale 2 L into the lungs continuous.   Yes Historical Provider, MD  potassium chloride (K-DUR,KLOR-CON) 10 MEQ tablet Take 20 mEq by mouth 2 (two) times daily. 07/28/16  Yes Historical Provider, MD  PROMETHEGAN 25 MG suppository Place 25 mg rectally daily as needed for nausea or vomiting.  07/28/16  Yes Historical Provider, MD  traMADol (ULTRAM) 50 MG tablet Take 50 mg by mouth every 12 (twelve) hours as needed (pain).    Yes Historical Provider, MD  furosemide (LASIX) 80 MG tablet Take 1 tablet (80 mg total) by mouth daily. Patient not taking: Reported on 08/03/2016 06/13/16   Rhetta MuraJai-Gurmukh Samtani, MD  gabapentin (NEURONTIN) 600 MG tablet Take 1 tablet (600 mg total) by mouth 2 (two) times daily. Patient not taking: Reported on 08/03/2016 05/08/16   Elease EtienneAnand D Hongalgi, MD  isosorbide mononitrate (IMDUR) 30 MG 24 hr tablet Take 1 tablet (30 mg total) by mouth daily. Patient not taking: Reported on 08/03/2016 06/13/16   Rhetta MuraJai-Gurmukh Samtani, MD    Family  History: Family Status  Relation Status  . Mother Deceased  . Father Deceased  . Maternal Grandmother Deceased  . Maternal Grandfather Deceased  . Paternal Grandmother Deceased  . Paternal Grandfather Deceased   Social History:   reports that she has quit smoking. She has never used smokeless tobacco. She reports that she does not drink alcohol or use drugs.   Review of Systems: No eye difficulties.  She does not have any recent GI symptoms and denies black stools.  Does have severe arthritis all over.  Complains of chronic pain involving her lower extremities.  He has a pressure sore previously on her buttocks.  Some urinary frequency, some numbness of her feet.  Other than as noted above remainder of the systems is unremarkable.    Physical Exam: BP 109/62   Pulse 69   Temp 97.5 F (36.4 C) (Oral)   Resp 20  Ht 5\' 4"  (1.626 m)   Wt (!) 145.2 kg (320 lb)   LMP  (LMP Unknown)   SpO2 95%   BMI 54.93 kg/m   General appearance: She is a pleasant severely obese polite black female currently in no acute distress Head: Normocephalic, without obvious abnormality, atraumatic Eyes: conjunctivae/corneas clear. PERRL, EOM's intact. Fundi not examined  Neck: no adenopathy, no carotid bruit, no JVD and supple, symmetrical, trachea midline Lungs: clear to auscultation bilaterally Heart: Irregular rhythm, normal S1 and S2, no S3 Abdomen: soft, non-tender; bowel sounds normal; no masses,  no organomegaly Pelvic: deferred Extremities: Trace edema, peripheral pulses diminished, left leg externally rotated Pulses: Peripheral pulses diminished  Skin: pressure sore on buttocks Neurologic: Grossly normal Psych: Alert and oriented x 3 Labs: CBC  Recent Labs  08/03/16 0555  WBC 12.9*  RBC 2.15*  HGB 6.3*  HCT 20.0*  PLT 121*  MCV 93.0  MCH 29.3  MCHC 31.5  RDW 18.5*  LYMPHSABS 2.2  MONOABS 0.8  EOSABS 0.1  BASOSABS 0.1   CMP   Recent Labs  08/03/16 0412  08/03/16 1147  NA  136  --   --   K 5.7*  < > 5.3*  CL 112*  --   --   CO2 17*  --   --   GLUCOSE 160*  --   --   BUN 69*  --   --   CREATININE 2.94*  --   --   CALCIUM 8.3*  --   --   PROT 6.6  --   --   ALBUMIN 1.6*  --   --   AST 56*  --   --   ALT 40  --   --   ALKPHOS 181*  --   --   BILITOT 2.7*  --   --   GFRNONAA 14*  --   --   GFRAA 17*  --   --   < > = values in this interval not displayed. BNP (last 3 results) BNP    Component Value Date/Time   BNP 500.3 (H) 08/02/2016 2352    Radiology:  No active disease, chronic lung scarring  EKG: Atrial fibrillation with somewhat rapid response on admission, IV conduction delay, right bundle branch block Independently reviewed by me  Signed:  W. Ashley Royalty MD [facility]   Cardiology Consultant  08/03/2016, 4:11 PM

## 2016-08-03 NOTE — Progress Notes (Signed)
[facility] PROGRESS NOTE  Lisa SchneidersGrace A Lisa [name] [name]:[id number] DOB: 04/28/1941 DOA: 08/02/2016  PCP: Lisa Lisa [name],Lisa Lisa [name], Lisa Lisa [name]  Brief History/Interval Summary: 76 year old female with medical history significant for atrial fibrillation on Eliquis, hypertension, CAD status post CABG, COPD with chronic respiratory failure, OSA, and chronic diastolic CHF who presents to the emergency department from her nursing facility for evaluation of severe left leg pain following a fall. Patient was evaluated in the emergency department. Found to have a left supracondylar fracture of the femur along with an oblique fracture involving the distal diaphysis of the left fibula. Patient was also noted to be anemic. Hospitalized for further management.  Reason for Visit: Left Supracondylar femur fracture  Consultants: Orthopedics. Cardiology  Procedures: None yet  Antibiotics: None  Subjective/Interval History: Patient states that pain is so reasonably well controlled at this time. She denies any chest shortness of breath, nausea and vomiting.  ROS: Denies headaches  Objective:  Vital Signs  Vitals:   08/03/16 0434 08/03/16 0441 08/03/16 0752 08/03/16 1004  BP: (!) 98/50 (!) 107/54  117/61  Pulse: 91     Resp: 20     Temp: 97.5 F (36.4 C)   97.4 F (36.3 C)  TempSrc: Oral   Oral  SpO2: 99%  98% 95%  Weight:      Height:        Intake/Output Summary (Last 24 hours) at 08/03/16 1148 Last data filed at 08/03/16 0557  Gross per 24 hour  Intake            843.5 ml  Output              500 ml  Net            343.5 ml   Filed Weights   08/02/16 1854  Weight: (!) 145.2 kg (320 lb)    General appearance: alert, cooperative, appears stated age and no distress Resp: clear to auscultation bilaterally Cardio: regular rate and rhythm, S1, S2 normal, no murmur, click, rub or gallop GI: soft, non-tender; bowel sounds normal; no masses,  no organomegaly Extremities: extremities normal, atraumatic,  no cyanosis or edema Neurologic: Alert and oriented X 3, normal strength and tone. Normal symmetric reflexes. Normal coordination and gait  Lab Results:  Data Reviewed: I have personally reviewed following labs and imaging studies  CBC:  Recent Labs Lab 08/02/16 2144 08/03/16 0555  WBC 13.1* 12.9*  NEUTROABS 11.0* 9.8*  HGB 7.2* 6.3*  HCT 23.0* 20.0*  MCV 93.1 93.0  PLT 138* 121*    Basic Metabolic Panel:  Recent Labs Lab 08/02/16 2144 08/02/16 2352 08/03/16 0412 08/03/16 0744  NA 135  --  136  --   K 5.5* 6.0* 5.7* 5.6*  CL 111  --  112*  --   CO2 14*  --  17*  --   GLUCOSE 160*  --  160*  --   BUN 73*  --  69*  --   CREATININE 3.11*  --  2.94*  --   CALCIUM 8.5*  --  8.3*  --     GFR: Estimated Creatinine Clearance: 23 mL/min (by C-G formula based on SCr of 2.94 mg/dL (H)).  Liver Function Tests:  Recent Labs Lab 08/02/16 2144 08/03/16 0412  AST 58* 56*  ALT 39 40  ALKPHOS 181* 181*  BILITOT 2.4* 2.7*  PROT 7.1 6.6  ALBUMIN 1.8* 1.6*    Coagulation Profile:  Recent Labs Lab 08/02/16 2144 08/03/16 0412  INR 2.00 1.99  CBG:  Recent Labs Lab 08/03/16 0736  GLUCAP 130*    Anemia Panel:  Recent Labs  08/02/16 2352  VITAMINB12 615  FOLATE 10.3  FERRITIN 1,086*  TIBC 175*  IRON 35  RETICCTPCT 3.8*    Recent Results (from the past 240 hour(Lisa [name]))  MRSA PCR Screening     Status: None   Collection Time: 08/03/16  4:43 AM  Result Value Ref Range Status   MRSA by PCR NEGATIVE NEGATIVE Final    Comment:        The GeneXpert MRSA Assay (FDA approved for NASAL specimens only), is one component of a comprehensive MRSA colonization surveillance program. It is not intended to diagnose MRSA infection nor to guide or monitor treatment for MRSA infections.       Radiology Studies: Dg Chest 1 View  Result Date: 08/02/2016 CLINICAL DATA:  Pain after fall.  Preop. EXAM: CHEST 1 VIEW COMPARISON:  06/07/2016 and 05/04/2016 CXR  FINDINGS: Low lung volumes as before. Status post CABG. Scarring in the right mid lung and left upper lobe. No pneumonic consolidation, effusion or pneumothorax. Osteoarthritis both glenohumeral joints. No acute fracture identified of the included ribs. IMPRESSION: No active disease.  Chronic right mid and left upper lobe scarring Electronically Signed   By: Tollie Eth M.D.   On: 08/02/2016 21:30   Dg Pelvis 1-2 Views  Result Date: 08/02/2016 CLINICAL DATA:  Pain after fall EXAM: PELVIS - 1-2 VIEW COMPARISON:  None. FINDINGS: There is no evidence of pelvic fracture or diastasis. Hip joints are maintained bilaterally. The included lumbar spine demonstrates lumbar spinal fusion hardware at L4 and L5. No pelvic bone lesions are seen. IMPRESSION: No acute pelvic fracture. No hip dislocation. No proximal femoral fracture is noted. Partially included lumbar fixation hardware noted along the lower lumbar spine to L5. Electronically Signed   By: Tollie Eth M.D.   On: 08/02/2016 21:26   Dg Tibia/fibula Left  Result Date: 08/02/2016 CLINICAL DATA:  Left leg pain after fall EXAM: LEFT TIBIA AND FIBULA - 2 VIEW COMPARISON:  None. FINDINGS: There is a subtle oblique fracture of the distal diaphysis of the fibula just above the tibiotalar joint without definite extension into the ankle joint. Minimally widened appearance of the lateral tibiotalar joint in the craniocaudad axis to 6 mm. Supracondylar fracture of the femur is excluded on this study. The patient is status post total knee arthroplasty. There is arteriosclerosis along the tibial arteries. IMPRESSION: Subtle oblique closed fracture of the distal diaphysis of the fibula which appears to spare the ankle joint. Minimal widening along the lateral aspect of the tibiotalar joint in the craniocaudad axis up to 6 mm. Electronically Signed   By: Tollie Eth M.D.   On: 08/02/2016 21:19   US Abdomen Complete  Result Date: 08/03/2016 CLINICAL DATA:  76 year old  female with acute renal failure. History of hypertension and diabetes. EXAM: ABDOMEN ULTRASOUND COMPLETE COMPARISON:  Abdominal ultrasound dated 05/07/2016 and CT dated 05/05/2016 FINDINGS: Evaluation is limited due to patient'Lisa [name] body habitus and portable technique. Gallbladder: Cholecystectomy. Common bile duct: Diameter: 6 mm. Liver: Apparent increased hepatic echogenicity may represent fatty infiltration. There liver is poorly visualized. IVC: No abnormality visualized. Pancreas: Not visualized. Spleen: Size and appearance within normal limits. Right Kidney: Length: 10.5 Cm. The kidney demonstrates increased echotexture compatible with underlying chronic kidney disease. There is no hydronephrosis or echogenic stone. There is a 4.8 x 4.5 x 4.5 cm right renal cyst. Left Kidney: Length: 11.5 cm. There is  increased left renal echogenicity. There is moderate left hydronephrosis with parenchymal atrophy. No definite echogenic stone identified. Abdominal aorta: No aneurysm visualized. Other findings: None. IMPRESSION: Increased renal echotexture likely related to underlying medical renal disease. There is moderate left hydronephrosis. Cholecystectomy. Electronically Signed   By: Elgie Collard M.D.   On: 08/03/2016 07:20   Dg Knee Complete 4 Views Left  Result Date: 08/02/2016 CLINICAL DATA:  Acute left knee pain after fall EXAM: LEFT KNEE - COMPLETE 4+ VIEW COMPARISON:  03/31/2014 FINDINGS: Acute, closed, comminuted supracondylar fracture of the left distal femoral diaphysis. There is dorsal displacement of the femoral condyles one shaft width and lateral displaced 1/2 shaft width relative to the femoral shaft. Lipohemarthrosis is noted. The patient is status post left knee arthroplasty. The fracture appears to spare the femoral condylar components. No loosening nor involvement of the tibial tray nor patella. No dislocation at the knee joint. Femoral and popliteal arteriosclerosis. IMPRESSION: Acute, closed,  comminuted supracondylar fracture of the distal left femoral diaphysis sparing left knee arthroplasty components with dorsolateral displacement of the femoral condyles. Lipohemarthrosis. Electronically Signed   By: Tollie Eth M.D.   On: 08/02/2016 21:14   Dg Femur Min 2 Views Left  Result Date: 08/02/2016 CLINICAL DATA:  Pain after fall EXAM: LEFT FEMUR 2 VIEWS COMPARISON:  None. FINDINGS: No acute fracture about the left hip. The left hip joint is maintained. Left superior and inferior pubic rami appear intact. Acute, closed, comminuted supracondylar fracture of the distal femoral diaphysis sparing the femoral condylar component of a total knee arthroplasty. Small 4.1 x 1.7 x 1.5 cm butterfly fragment is noted dorsolaterally. There is dorsal and lateral displacement of the femoral condyles relative to the femoral shaft by 1/2 shaft width in both directions on these images. IMPRESSION: Acute, closed, comminuted supracondylar fracture of the left distal femoral diaphysis with 1/2 shaft with lateral and dorsal displacement of the femoral condyles relative to the femoral shaft. Electronically Signed   By: Tollie Eth M.D.   On: 08/02/2016 21:23     Medications:  Scheduled: . amLODipine  5 mg Oral Daily  . atorvastatin  10 mg Oral q1800  . budesonide (PULMICORT) nebulizer solution  1 mg Nebulization BID  . buPROPion  150 mg Oral Daily  . docusate sodium  100 mg Oral BID  . gabapentin  600 mg Oral BID  . hydrALAZINE  25 mg Oral Q8H  . isosorbide mononitrate  30 mg Oral Daily  . metoprolol succinate  50 mg Oral Daily  . pantoprazole  40 mg Oral Daily  . sodium chloride flush  3 mL Intravenous Q12H   Continuous:  [name]:[id number] **OR** acetaminophen, bisacodyl, HYDROcodone-acetaminophen, ipratropium-albuterol, morphine injection, ondansetron **OR** ondansetron (ZOFRAN) IV, polyethylene glycol  Assessment/Plan:  Principal Problem:   Femur fracture, left (HCC) Active Problems:   GERD  (gastroesophageal reflux disease)   Hypertension   Chronic diastolic CHF (congestive heart failure) (HCC)   OSA (obstructive sleep apnea)   PAF- DDCV Jan 2017   AKI (acute kidney injury) (HCC)   Chronic respiratory failure with hypoxia (HCC)   Pressure sore   Anemia, normocytic normochromic   CAD (coronary artery disease)   Fibula fracture   CKD (chronic kidney disease), stage III   Hyperbilirubinemia   Coagulopathy (HCC)    Acute left femur fracture, left fibula fracture  This was as a result of a mechanical fall. Orthopedic surgery has seen the patient. Anticoagulation has been discontinued for planned surgery. Due to patient'Lisa [name] complicated  cardiac history, we will consult cardiology to provide a preoperative assessment. Preoperative concerns include severe chronic anemia, CHF with recent exacerbation, AKI superimposed on CKD stage III, COPD with chronic resp failure, and new hyperbilirubinemia and possible coagulopathy; these are addressed below. Patient is high-risk for perioperative mortality, with risk of perioperative cardiac complications 3-4% per Amie Critchley.  AKI superimposed on CKD stage III with hyperkalemia Patient'Lisa [name] baseline appears to be between 1.5 and 2.0. 18 was 3.1 on admission. Patient appeared to be intravascularly depleted. She was given IV fluids. Renal function has improved. Continue to monitor closely. Potassium level has also improved after patient was given Kayexalate. Repeat labs. Lasix had been held for now. Ultrasound does show moderate left-sided hydronephrosis. No stone was identified. Discussed with urology. Recommends CT scan renal stone study. Since patient is asymptomatic this will be ordered for tomorrow. Monitor creatinine. Labs will be due..  Normocytic anemia  Hgb is 7.2 on admission; recent priors have been in 7-9 range. Hemoglobin is 6.7 this morning. No overt bleeding has been noted. Some of this is due to dilution. She'll be transfused 2 units of  blood due to need for surgery.   Paroxysmal atrial fibrillation Stable. CHADS-VASc at least 54 (age x2, gender, CHF, HTN). She is anticoagulated with Eliquis; this is held on admission in preparation for surgery. Continue metoprolol as tolerated.  Chronic diastolic CHF  Patient appeared to be intravascularly depleted on admission. She was given IV fluids. Appears to be stable currently and euvolemic. TTE (05/05/16) with EF 50-55%, moderate LVH, grade 1 diastolic dysfunction, moderate LAE, mild-mod TR, and moderate increase in PA pressures. Lasix held on admission in light of AKI and apparent dehydration. Strict ins and outs and daily weights.  CAD  No anginal complaints on admission. Pt is Lisa [name]/p CABG and follows with cardiology in outpatient setting. Patient was supposed to have stress test earlier this month, but she tells me that she did not undergo the same. The reason for ordering this test was not clear. Cardiology to see the patient.  Hyperbilirubinemia, coagulopathy Reason for hyperbilirubinemia is not entirely clear. Ultrasound suggests fatty infiltration of the liver. Alkaline phosphatase is also noted to be mildly elevated. CBD is nondistended. Coagulopathy could be due to anticoagulation.  COPD, OSA, chronic respiratory failure  Patient uses 2 Lpm supplemental oxygen at baseline. Respiratory status appears to be stable on admission. Continue supplemental O2, CPAP qHS.  Essential Hypertension Continue Toprol, Norvasc, and hydralazine as tolerated    DVT Prophylaxis: Anticoagulation on hold for surgery    Code Status: Full code  Family Communication: Discussed with patient  Disposition Plan: Management as outlined above.    LOS: 1 day   [facility] [place]  [facility] Pager [phone] 08/03/2016, 11:48 AM  If 7PM-7AM, please contact night-coverage at [email or web address], password [facility]

## 2016-08-03 NOTE — Progress Notes (Signed)
Patient ID: Lisa [name], female   DOB: 01-01-1941, 76 y.o.   MRN: [id number] Surgery Monday. Creatinine stopped rising. Potassium has not climbed. Agree with transfusion, likely will need total of 2 units by late Monday afternoon if she is stable enough for surgery.

## 2016-08-03 NOTE — Progress Notes (Signed)
Dr Rito EhrlichKrishnan is aware of pt's hemoglobin level of 6.3.

## 2016-08-04 ENCOUNTER — Inpatient Hospital Stay (HOSPITAL_COMMUNITY): Payer: Medicare Other

## 2016-08-04 LAB — PROTIME-INR
INR: 1.75
Prothrombin Time: 20.7 seconds — ABNORMAL HIGH (ref 11.4–15.2)

## 2016-08-04 LAB — BILIRUBIN, FRACTIONATED(TOT/DIR/INDIR)
Bilirubin, Direct: 1.2 mg/dL — ABNORMAL HIGH (ref 0.1–0.5)
Indirect Bilirubin: 1.2 mg/dL — ABNORMAL HIGH (ref 0.3–0.9)
Total Bilirubin: 2.4 mg/dL — ABNORMAL HIGH (ref 0.3–1.2)

## 2016-08-04 LAB — CBC
HEMATOCRIT: 27.1 % — AB (ref 36.0–46.0)
HEMOGLOBIN: 8.7 g/dL — AB (ref 12.0–15.0)
MCH: 28.9 pg (ref 26.0–34.0)
MCHC: 32.1 g/dL (ref 30.0–36.0)
MCV: 90 fL (ref 78.0–100.0)
Platelets: 85 10*3/uL — ABNORMAL LOW (ref 150–400)
RBC: 3.01 MIL/uL — ABNORMAL LOW (ref 3.87–5.11)
RDW: 17.1 % — ABNORMAL HIGH (ref 11.5–15.5)
WBC: 11.2 10*3/uL — AB (ref 4.0–10.5)

## 2016-08-04 LAB — GLUCOSE, CAPILLARY: GLUCOSE-CAPILLARY: 72 mg/dL (ref 65–99)

## 2016-08-04 LAB — COMPREHENSIVE METABOLIC PANEL
ALBUMIN: 1.7 g/dL — AB (ref 3.5–5.0)
ALK PHOS: 152 U/L — AB (ref 38–126)
ALT: 35 U/L (ref 14–54)
AST: 53 U/L — AB (ref 15–41)
Anion gap: 6 (ref 5–15)
BILIRUBIN TOTAL: 2.6 mg/dL — AB (ref 0.3–1.2)
BUN: 66 mg/dL — AB (ref 6–20)
CALCIUM: 8 mg/dL — AB (ref 8.9–10.3)
CO2: 16 mmol/L — ABNORMAL LOW (ref 22–32)
CREATININE: 2.72 mg/dL — AB (ref 0.44–1.00)
Chloride: 112 mmol/L — ABNORMAL HIGH (ref 101–111)
GFR calc Af Amer: 18 mL/min — ABNORMAL LOW (ref >60)
GFR, EST NON AFRICAN AMERICAN: 16 mL/min — AB (ref >60)
GLUCOSE: 101 mg/dL — AB (ref 65–99)
Potassium: 5.3 mmol/L — ABNORMAL HIGH (ref 3.5–5.1)
Sodium: 134 mmol/L — ABNORMAL LOW (ref 135–145)
Total Protein: 6.5 g/dL (ref 6.5–8.1)

## 2016-08-04 LAB — UREA NITROGEN, URINE: Urea Nitrogen, Ur: 621 mg/dL

## 2016-08-04 MED ORDER — SODIUM CHLORIDE 0.9 % IV SOLN
INTRAVENOUS | Status: AC
  Administered 2016-08-04: 11:00:00 via INTRAVENOUS

## 2016-08-04 MED ORDER — SODIUM POLYSTYRENE SULFONATE 15 GM/60ML PO SUSP
15.0000 g | Freq: Once | ORAL | Status: AC
  Administered 2016-08-04: 15 g via ORAL
  Filled 2016-08-04: qty 60

## 2016-08-04 NOTE — Progress Notes (Signed)
Subjective:  She is lying in bed and complains of no shortness of breath or chest pain.  Renal function has improved and creatinine is coming down a little bit.  Urine output fair.  Objective:  Vital Signs in the last 24 hours: BP 120/69 (BP Location: Right Wrist)   Pulse (!) 101   Temp 98.3 F (36.8 C) (Oral)   Resp 20   Ht 5\' 4"  (1.626 m)   Wt 103.2 kg (227 lb 9.6 oz)   LMP  (LMP Unknown)   SpO2 97%   BMI 39.07 kg/m   Physical Exam: Obese black female lying in bed in no acute distress Lungs:  Clear  Cardiac:  Regular rhythm with irregular beats noted, normal S1 and S2, no S3 Extremities:  1+ edema present, leg externally rotated  Intake/Output from previous day: 01/27 0701 - 01/28 0700 In: 890 [P.O.:120; I.V.:100; Blood:670] Out: 1000 [Urine:1000] Weight Filed Weights   08/02/16 1854 08/04/16 0433  Weight: (!) 145.2 kg (320 lb) 103.2 kg (227 lb 9.6 oz)    Lab Results: Basic Metabolic Panel:  Recent Labs  16/04/9600/27/18 0412  08/03/16 1147 08/04/16 0457  NA 136  --   --  134*  K 5.7*  < > 5.3* 5.3*  CL 112*  --   --  112*  CO2 17*  --   --  16*  GLUCOSE 160*  --   --  101*  BUN 69*  --   --  66*  CREATININE 2.94*  --   --  2.72*  < > = values in this interval not displayed.  CBC:  Recent Labs  08/02/16 2144 08/03/16 0555 08/04/16 0457  WBC 13.1* 12.9* 11.2*  NEUTROABS 11.0* 9.8*  --   HGB 7.2* 6.3* 8.7*  HCT 23.0* 20.0* 27.1*  MCV 93.1 93.0 90.0  PLT 138* 121* 85*   PROTIME: Lab Results  Component Value Date   INR 1.75 08/04/2016   INR 1.99 08/03/2016   INR 2.00 08/02/2016    Telemetry: Looks like sinus with premature beats but will get EKG to confirm  Assessment/Plan:  1.  Chronic diastolic heart failure 2.  Anemia due to blood loss somewhat better today following transfusion 3.  Acute on chronic renal failure improving 4.  Femur fracture in need of repair 5.  History of atrial fibrillation rate controlled 6.  Instructed sleep apnea 7.   CAD with previous bypass grafting without angina.  Recommendations:  Her lungs are clear.  She has somewhat stable renal function now and may need to have transfusion again with her surgery.  Again as stated her surgery is high risk due to her comorbidities but she looks stable enough a cardiovascular viewpoint to undergo that at this time.        Darden PalmerW. Spencer Allisson Schindel, Jr.  MD [facility]  08/04/2016, 9:45 AM

## 2016-08-04 NOTE — Consult Note (Signed)
Consult: left hydronephrosis Requested by: Dr. Barnie Del  History of Present Illness: 76 year old African-American female admitted with a left femur fracture and noted to have acute kidney injury. She underwent a renal ultrasound yesterday which showed significant hydronephrosis of the left kidney. 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 No stones were noted. Her creatinine is elevated and has been anywhere from 1.03-2.68 over the past year but seems to be rising over the past few's. Of note she was seen by Dr. Sherron Monday in our office in 2013 with lower urinary tract symptoms and microscopic hematuria. A CT scan at that time revealed a left extrarenal pelvis and there was an impression on the renal pelvis which appeared to be a crossing vessel. Its possible she's developed a UPJ obstruction. She is on a regular diet today and is eating dinner. She's been afebrile. A Foley is in place with clear urine. Creatinine slightly improved with resuscitation from 3.11 down to 2.72.  Patient reports she typically voids with a good stream and denies flank pain or hematuria.   Past Medical History:  Diagnosis Date  . Chronic diastolic CHF (congestive heart failure) (HCC)   . Coronary artery disease    s/p CABG  . Diabetes mellitus without complication (HCC)   . Hyperlipidemia   . Hypertension   . Morbid obesity (HCC)   . OSA (obstructive sleep apnea)    mild OSA with AHI 7.15 now on CPAP at 16cm H2O  . RBBB    Past Surgical History:  Procedure Laterality Date  . BACK SURGERY    . CARDIOVERSION N/A 08/03/2015   Procedure: CARDIOVERSION;  Surgeon: Vesta Mixer, MD;  Location[facility];  Service: Cardiovascular;  Laterality: N/A;  . CORONARY ANGIOPLASTY WITH STENT PLACEMENT    . CORONARY ARTERY BYPASS GRAFT    . KNEE SURGERY Bilateral   . ORIF ANKLE FRACTURE Right 08/19/2014   Procedure: OPEN REDUCTION INTERNAL  FIXATION (ORIF) ANKLE FRACTURE;  Surgeon: Cammy Copa, MD;  Location[facility] OR;  Service: Orthopedics;  Laterality: Right;  . rotator cuff surgery      Home Medications:  Prescriptions Prior to Admission  Medication Sig Dispense Refill Last Dose  . amLODipine (NORVASC) 5 MG tablet Take 1 tablet (5 mg total) by mouth daily. 30 tablet 11 maybe 1/26 at am  . apixaban (ELIQUIS) 5 MG TABS tablet Take 1 tablet (5 mg total) by mouth 2 (two) times daily. 180 tablet 3 08/02/2016 at approx 11am  . atorvastatin (LIPITOR) 10 MG tablet Take 1 tablet (10 mg total) by mouth daily at 6 PM. 30 tablet 0 maybe 1/26  . buPROPion (WELLBUTRIN XL) 150 MG 24 hr tablet Take 150 mg by mouth daily. Reported on 08/02/2015   maybe 1/26 at Unknown time  . fluticasone (FLOVENT HFA) 220 MCG/ACT inhaler Inhale 2 puffs into the lungs 2 (two) times daily as needed (shortness of breath/ wheezing).   maybe 1/26  . hydrALAZINE (APRESOLINE) 25 MG tablet Take 1 tablet (25 mg total) by mouth every 8 (eight) hours. 90 tablet 0 08/02/2016 at am  . ipratropium-albuterol (DUONEB) 0.5-2.5 (3) MG/3ML SOLN Take 3 mLs by nebulization every 6 (six) hours as needed. Wheezing or shortness of breath. (Patient taking differently: Take 3 mLs by nebulization every 6 (six) hours as needed (shortness of breath/ wheezing). )   maybe 1/26  . metolazone (ZAROXOLYN) 2.5 MG tablet Take 2.5 mg by mouth every Monday,  Wednesday, and Friday.   08/02/2016 at Unknown time  . metoprolol succinate (TOPROL-XL) 50 MG 24 hr tablet Take 1 tablet (50 mg total) by mouth daily. Take with or immediately following a meal. 30 tablet 0 maybe 1/26 at approx 11am  . mirtazapine (REMERON) 7.5 MG tablet Take 7.5 mg by mouth at bedtime.   maybe 1/25  . omeprazole (PRILOSEC) 20 MG capsule Take 20 mg by mouth daily.   maybe 1/26  . OXYGEN Inhale 2 L into the lungs continuous.   08/03/2016 at Unknown time  . potassium chloride (K-DUR,KLOR-CON) 10 MEQ tablet Take 20 mEq by mouth 2 (two)  times daily.   08/02/2016 at am  . PROMETHEGAN 25 MG suppository Place 25 mg rectally daily as needed for nausea or vomiting.    unknown  . traMADol (ULTRAM) 50 MG tablet Take 50 mg by mouth every 12 (twelve) hours as needed (pain).    unknown at Unknown time  . furosemide (LASIX) 80 MG tablet Take 1 tablet (80 mg total) by mouth daily. (Patient not taking: Reported on 08/03/2016) 30 tablet 0 Not Taking at Unknown time  . gabapentin (NEURONTIN) 600 MG tablet Take 1 tablet (600 mg total) by mouth 2 (two) times daily. (Patient not taking: Reported on 08/03/2016)   Not Taking at Unknown time  . isosorbide mononitrate (IMDUR) 30 MG 24 hr tablet Take 1 tablet (30 mg total) by mouth daily. (Patient not taking: Reported on 08/03/2016) 30 tablet 0 Not Taking at Unknown time   Allergies: No Known Allergies  Family History  Problem Relation Age of Onset  . Heart attack Mother   . Heart disease Mother   . Stomach cancer Father    Social History:  reports that she has quit smoking. She has never used smokeless tobacco. She reports that she does not drink alcohol or use drugs.  ROS: A complete review of systems was performed.  All systems are negative except for pertinent findings as noted. ROS   Physical Exam:  Vital signs in last 24 hours: Temp:  [97.3 F (36.3 C)-98.4 F (36.9 C)] 98.4 F (36.9 C) (01/28 1404) Pulse Rate:  [75-104] 99 (01/28 1404) Resp:  [18-20] 18 (01/28 1404) BP: (100-120)/(44-96) 103/61 (01/28 1404) SpO2:  [95 %-100 %] 100 % (01/28 1404) Weight:  [103.2 kg (227 lb 9.6 oz)] 103.2 kg (227 lb 9.6 oz) (01/28 0433) General:  Alert and oriented, No acute distress, sleepy  HEENT: Normocephalic, atraumatic Cardiovascular: Regular rate and rhythm Lungs: Regular rate and effort Abdomen: Soft, nontender, nondistended, no abdominal masses Back: No CVA tenderness Extremities: No edema - left leg brace Neurologic: Grossly intact  Laboratory Data:  Results for orders placed or  performed during the hospital encounter of 08/02/16 (from the past 24 hour(s))  Protime-INR     Status: Abnormal   Collection Time: 08/04/16  4:57 AM  Result Value Ref Range   Prothrombin Time 20.7 (H) 11.4 - 15.2 seconds   INR 1.75   CBC     Status: Abnormal   Collection Time: 08/04/16  4:57 AM  Result Value Ref Range   WBC 11.2 (H) 4.0 - 10.5 K/uL   RBC 3.01 (L) 3.87 - 5.11 MIL/uL   Hemoglobin 8.7 (L) 12.0 - 15.0 g/dL   HCT 16.1 (L) 09.6 - 04.5 %   MCV 90.0 78.0 - 100.0 fL   MCH 28.9 26.0 - 34.0 pg   MCHC 32.1 30.0 - 36.0 g/dL   RDW 40.9 (H) 81.1 -  15.5 %   Platelets 85 (L) 150 - 400 K/uL  Comprehensive metabolic panel     Status: Abnormal   Collection Time: 08/04/16  4:57 AM  Result Value Ref Range   Sodium 134 (L) 135 - 145 mmol/L   Potassium 5.3 (H) 3.5 - 5.1 mmol/L   Chloride 112 (H) 101 - 111 mmol/L   CO2 16 (L) 22 - 32 mmol/L   Glucose, Bld 101 (H) 65 - 99 mg/dL   BUN 66 (H) 6 - 20 mg/dL   Creatinine, Ser 1.192.72 (H) 0.44 - 1.00 mg/dL   Calcium 8.0 (L) 8.9 - 10.3 mg/dL   Total Protein 6.5 6.5 - 8.1 g/dL   Albumin 1.7 (L) 3.5 - 5.0 g/dL   AST 53 (H) 15 - 41 U/L   ALT 35 14 - 54 U/L   Alkaline Phosphatase 152 (H) 38 - 126 U/L   Total Bilirubin 2.6 (H) 0.3 - 1.2 mg/dL   GFR calc non Af Amer 16 (L) >60 mL/min   GFR calc Af Amer 18 (L) >60 mL/min   Anion gap 6 5 - 15  Glucose, capillary     Status: None   Collection Time: 08/04/16  8:00 AM  Result Value Ref Range   Glucose-Capillary 72 65 - 99 mg/dL   Comment 1 Notify RN   Bilirubin, fractionated(tot/dir/indir)     Status: Abnormal   Collection Time: 08/04/16  2:04 PM  Result Value Ref Range   Total Bilirubin 2.4 (H) 0.3 - 1.2 mg/dL   Bilirubin, Direct 1.2 (H) 0.1 - 0.5 mg/dL   Indirect Bilirubin 1.2 (H) 0.3 - 0.9 mg/dL   Recent Results (from the past 240 hour(s))  MRSA PCR Screening     Status: None   Collection Time: 08/03/16  4:43 AM  Result Value Ref Range Status   MRSA by PCR NEGATIVE NEGATIVE Final     Comment:        The GeneXpert MRSA Assay (FDA approved for NASAL specimens only), is one component of a comprehensive MRSA colonization surveillance program. It is not intended to diagnose MRSA infection nor to guide or monitor treatment for MRSA infections.    Creatinine:  Recent Labs  08/02/16 2144 08/03/16 0412 08/04/16 0457  CREATININE 3.11* 2.94* 2.72*    Impression/Assessment/plan: Left hydronephrosis-it appears she has developed significant left hydronephrosis and I wonder if it's an evolving UPJ obstruction. I discussed with the patient and called her daughter and went over the CT findings and the nature risks benefits and alternatives to cystoscopy with left RGP, including side effects of the proposed treatment, the likelihood of the patient achieving the goals of the procedure, and any potential problems that might occur during the procedure or recuperation. We discussed the temporary nature of stent and need for URS and stent removal/exchange down the road once she recovers from the leg fracture repair. Given she's NPO tomorrow for procedure, we'll plan cysto/stent in OR with ortho. I discussed with Dr. Ophelia CharterYates. All questions answered. Patient and daughter elect to proceed. Discussed with patient and daughter, on call urologist Dr. Retta Dionesahlstedt will likely perform procedure.    Nakeyia Menden 08/04/2016, 5:43 PM

## 2016-08-04 NOTE — Progress Notes (Signed)
Patient refused CPAP tonight. There Isn't a machine in the room at this time. RN aware. Explained to Patient that if they changed their mind, to just have the RN call Respiratory and we would come set them up. 

## 2016-08-04 NOTE — Progress Notes (Signed)
[facility] PROGRESS NOTE  IDORA BROSIOUS NFA:[id number] DOB: 1941/03/25 DOA: 08/02/2016  PCP: Laurena Slimmer, MD  Brief History/Interval Summary: 76 year old female with medical history significant for atrial fibrillation on Eliquis, hypertension, CAD status post CABG, COPD with chronic respiratory failure, OSA, and chronic diastolic CHF who presents to the emergency department from her nursing facility for evaluation of severe left leg pain following a fall. Patient was evaluated in the emergency department. Found to have a left supracondylar fracture of the femur along with an oblique fracture involving the distal diaphysis of the left fibula. Patient was also noted to be anemic. Hospitalized for further management.  Reason for Visit: Left Supracondylar femur fracture  Consultants: Orthopedics. Cardiology  Procedures: None yet  Antibiotics: None  Subjective/Interval History: Patient denies any pain this morning. Denies any chest pain, shortness of breath, nausea or vomiting.   ROS: Denies headaches  Objective:  Vital Signs  Vitals:   08/03/16 1943 08/03/16 2107 08/03/16 2206 08/04/16 0433  BP: 100/62 (!) 112/44 (!) 110/96 120/69  Pulse: 75 92 (!) 104 (!) 101  Resp: 20 19 20 20   Temp: 97.3 F (36.3 C) 97.6 F (36.4 C) 97.5 F (36.4 C) 98.3 F (36.8 C)  TempSrc: Oral Oral Oral Oral  SpO2: 96% 100% 98% 97%  Weight:    103.2 kg (227 lb 9.6 oz)  Height:        Intake/Output Summary (Last 24 hours) at 08/04/16 0756 Last data filed at 08/04/16 4696  Gross per 24 hour  Intake              890 ml  Output             1000 ml  Net             -110 ml   Filed Weights   08/02/16 1854 08/04/16 0433  Weight: (!) 145.2 kg (320 lb) 103.2 kg (227 lb 9.6 oz)    General appearance: alert, cooperative, appears stated age and no distress Resp: clear to auscultation bilaterally Cardio: regular rate and rhythm, S1, S2 normal, no murmur, click, rub or gallop GI: soft,  non-tender; bowel sounds normal; no masses,  no organomegaly Extremities: Left lower extremity in a brace Neurologic: Alert and oriented X 3, no focal neurological deficits.  Lab Results:  Data Reviewed: I have personally reviewed following labs and imaging studies  CBC:  Recent Labs Lab 08/02/16 2144 08/03/16 0555 08/04/16 0457  WBC 13.1* 12.9* 11.2*  NEUTROABS 11.0* 9.8*  --   HGB 7.2* 6.3* 8.7*  HCT 23.0* 20.0* 27.1*  MCV 93.1 93.0 90.0  PLT 138* 121* 85*    Basic Metabolic Panel:  Recent Labs Lab 08/02/16 2144 08/02/16 2352 08/03/16 0412 08/03/16 0744 08/03/16 1147 08/04/16 0457  NA 135  --  136  --   --  134*  K 5.5* 6.0* 5.7* 5.6* 5.3* 5.3*  CL 111  --  112*  --   --  112*  CO2 14*  --  17*  --   --  16*  GLUCOSE 160*  --  160*  --   --  101*  BUN 73*  --  69*  --   --  66*  CREATININE 3.11*  --  2.94*  --   --  2.72*  CALCIUM 8.5*  --  8.3*  --   --  8.0*    GFR: Estimated Creatinine Clearance: 20.3 mL/min (by C-G formula based on SCr of 2.72 mg/dL (H)).  Liver Function Tests:  Recent Labs Lab 08/02/16 2144 08/03/16 0412 08/04/16 0457  AST 58* 56* 53*  ALT 39 40 35  ALKPHOS 181* 181* 152*  BILITOT 2.4* 2.7* 2.6*  PROT 7.1 6.6 6.5  ALBUMIN 1.8* 1.6* 1.7*    Coagulation Profile:  Recent Labs Lab 08/02/16 2144 08/03/16 0412 08/04/16 0457  INR 2.00 1.99 1.75    CBG:  Recent Labs Lab 08/03/16 0736  GLUCAP 130*    Anemia Panel:  Recent Labs  08/02/16 2352  VITAMINB12 615  FOLATE 10.3  FERRITIN 1,086*  TIBC 175*  IRON 35  RETICCTPCT 3.8*    Recent Results (from the past 240 hour(s))  MRSA PCR Screening     Status: None   Collection Time: 08/03/16  4:43 AM  Result Value Ref Range Status   MRSA by PCR NEGATIVE NEGATIVE Final    Comment:        The GeneXpert MRSA Assay (FDA approved for NASAL specimens only), is one component of a comprehensive MRSA colonization surveillance program. It is not intended to diagnose  MRSA infection nor to guide or monitor treatment for MRSA infections.       Radiology Studies: Dg Chest 1 View  Result Date: 08/02/2016 CLINICAL DATA:  Pain after fall.  Preop. EXAM: CHEST 1 VIEW COMPARISON:  06/07/2016 and 05/04/2016 CXR FINDINGS: Low lung volumes as before. Status post CABG. Scarring in the right mid lung and left upper lobe. No pneumonic consolidation, effusion or pneumothorax. Osteoarthritis both glenohumeral joints. No acute fracture identified of the included ribs. IMPRESSION: No active disease.  Chronic right mid and left upper lobe scarring Electronically Signed   By: Tollie Eth M.D.   On: 08/02/2016 21:30   Dg Pelvis 1-2 Views  Result Date: 08/02/2016 CLINICAL DATA:  Pain after fall EXAM: PELVIS - 1-2 VIEW COMPARISON:  None. FINDINGS: There is no evidence of pelvic fracture or diastasis. Hip joints are maintained bilaterally. The included lumbar spine demonstrates lumbar spinal fusion hardware at L4 and L5. No pelvic bone lesions are seen. IMPRESSION: No acute pelvic fracture. No hip dislocation. No proximal femoral fracture is noted. Partially included lumbar fixation hardware noted along the lower lumbar spine to L5. Electronically Signed   By: Tollie Eth M.D.   On: 08/02/2016 21:26   Dg Tibia/fibula Left  Result Date: 08/02/2016 CLINICAL DATA:  Left leg pain after fall EXAM: LEFT TIBIA AND FIBULA - 2 VIEW COMPARISON:  None. FINDINGS: There is a subtle oblique fracture of the distal diaphysis of the fibula just above the tibiotalar joint without definite extension into the ankle joint. Minimally widened appearance of the lateral tibiotalar joint in the craniocaudad axis to 6 mm. Supracondylar fracture of the femur is excluded on this study. The patient is status post total knee arthroplasty. There is arteriosclerosis along the tibial arteries. IMPRESSION: Subtle oblique closed fracture of the distal diaphysis of the fibula which appears to spare the ankle joint.  Minimal widening along the lateral aspect of the tibiotalar joint in the craniocaudad axis up to 6 mm. Electronically Signed   By: Tollie Eth M.D.   On: 08/02/2016 21:19   US Abdomen Complete  Result Date: 08/03/2016 CLINICAL DATA:  76 year old female with acute renal failure. History of hypertension and diabetes. EXAM: ABDOMEN ULTRASOUND COMPLETE COMPARISON:  Abdominal ultrasound dated 05/07/2016 and CT dated 05/05/2016 FINDINGS: Evaluation is limited due to patient's body habitus and portable technique. Gallbladder: Cholecystectomy. Common bile duct: Diameter: 6 mm. Liver: Apparent increased hepatic  echogenicity may represent fatty infiltration. There liver is poorly visualized. IVC: No abnormality visualized. Pancreas: Not visualized. Spleen: Size and appearance within normal limits. Right Kidney: Length: 10.5 Cm. The kidney demonstrates increased echotexture compatible with underlying chronic kidney disease. There is no hydronephrosis or echogenic stone. There is a 4.8 x 4.5 x 4.5 cm right renal cyst. Left Kidney: Length: 11.5 cm. There is increased left renal echogenicity. There is moderate left hydronephrosis with parenchymal atrophy. No definite echogenic stone identified. Abdominal aorta: No aneurysm visualized. Other findings: None. IMPRESSION: Increased renal echotexture likely related to underlying medical renal disease. There is moderate left hydronephrosis. Cholecystectomy. Electronically Signed   By: Elgie Collard M.D.   On: 08/03/2016 07:20   Dg Knee Complete 4 Views Left  Result Date: 08/02/2016 CLINICAL DATA:  Acute left knee pain after fall EXAM: LEFT KNEE - COMPLETE 4+ VIEW COMPARISON:  03/31/2014 FINDINGS: Acute, closed, comminuted supracondylar fracture of the left distal femoral diaphysis. There is dorsal displacement of the femoral condyles one shaft width and lateral displaced 1/2 shaft width relative to the femoral shaft. Lipohemarthrosis is noted. The patient is status post left  knee arthroplasty. The fracture appears to spare the femoral condylar components. No loosening nor involvement of the tibial tray nor patella. No dislocation at the knee joint. Femoral and popliteal arteriosclerosis. IMPRESSION: Acute, closed, comminuted supracondylar fracture of the distal left femoral diaphysis sparing left knee arthroplasty components with dorsolateral displacement of the femoral condyles. Lipohemarthrosis. Electronically Signed   By: Tollie Eth M.D.   On: 08/02/2016 21:14   Dg Femur Min 2 Views Left  Result Date: 08/02/2016 CLINICAL DATA:  Pain after fall EXAM: LEFT FEMUR 2 VIEWS COMPARISON:  None. FINDINGS: No acute fracture about the left hip. The left hip joint is maintained. Left superior and inferior pubic rami appear intact. Acute, closed, comminuted supracondylar fracture of the distal femoral diaphysis sparing the femoral condylar component of a total knee arthroplasty. Small 4.1 x 1.7 x 1.5 cm butterfly fragment is noted dorsolaterally. There is dorsal and lateral displacement of the femoral condyles relative to the femoral shaft by 1/2 shaft width in both directions on these images. IMPRESSION: Acute, closed, comminuted supracondylar fracture of the left distal femoral diaphysis with 1/2 shaft with lateral and dorsal displacement of the femoral condyles relative to the femoral shaft. Electronically Signed   By: Tollie Eth M.D.   On: 08/02/2016 21:23     Medications:  Scheduled: . amLODipine  5 mg Oral Daily  . atorvastatin  10 mg Oral q1800  . budesonide (PULMICORT) nebulizer solution  1 mg Nebulization BID  . buPROPion  150 mg Oral Daily  .  ceFAZolin (ANCEF) IV  3 g Intravenous To SS-Surg  . chlorhexidine  60 mL Topical Once  . docusate sodium  100 mg Oral BID  . gabapentin  600 mg Oral BID  . hydrALAZINE  25 mg Oral Q8H  . isosorbide mononitrate  30 mg Oral Daily  . metoprolol succinate  50 mg Oral Daily  . pantoprazole  40 mg Oral Daily  . povidone-iodine  2  application Topical Once  . sodium chloride flush  3 mL Intravenous Q12H  . sodium polystyrene  15 g Oral Once   Continuous: . sodium chloride     ZOX:[id number] **OR** acetaminophen, bisacodyl, HYDROcodone-acetaminophen, ipratropium-albuterol, morphine injection, ondansetron **OR** ondansetron (ZOFRAN) IV, polyethylene glycol  Assessment/Plan:  Principal Problem:   Femur fracture, left (HCC) Active Problems:   GERD (gastroesophageal reflux disease)  Hypertension   Chronic diastolic heart failure (HCC)   OSA (obstructive sleep apnea)   Chronic respiratory failure with hypoxia (HCC)   Pressure sore   Anemia, normocytic normochromic   CAD (coronary artery disease)   Fibula fracture   Coagulopathy (HCC)   Hypertensive heart disease    Acute left femur fracture, left fibula fracture  This was as a result of a mechanical fall. Orthopedic surgery has seen the patient. Anticoagulation has been discontinued for planned surgery. Due to patient's complicated cardiac history, cardiology was consulted to provide a preoperative assessment. Preoperative concerns include severe chronic anemia, CHF with recent exacerbation, AKI superimposed on CKD stage III, COPD with chronic resp failure, and new hyperbilirubinemia and possible coagulopathy; these are addressed below. Patient is high-risk for perioperative mortality, with risk of perioperative cardiac complications 3-4% per Amie CritchleyGupta, et.al.. Orthopedics plans to take her to the OR tomorrow.  AKI superimposed on CKD stage III with hyperkalemia Patient's baseline appears to be between 1.5 and 2.0. Creatinine was 3.1 on admission. Patient appeared to be intravascularly depleted. She was given IV fluids. Renal function has improved, but creatinine remains elevated. Give gentle hydration for now. Give her 1 more dose of Kayexalate. Lasix had been held for now. Ultrasound did show moderate left-sided hydronephrosis. No stone was identified. Discussed  with urology. CT scan renal stone study was recommended. This was done today and do show significant left-sided hydronephrosis without hydroureter. Raising concern for obstruction at the UPJ level. We will discuss with urology.  Normocytic anemia/thrombocytopenia  Hgb is 7.2 on admission; recent priors have been in 7-9 range. Hemoglobin did drop to 6.7 on 1/27. She was transfused 2 units of blood. Hemoglobin has improved. Continue to monitor. Low platelet counts noted today. Repeat tomorrow.   Paroxysmal atrial fibrillation Stable. CHADS-VASc at least 815 (age x2, gender, CHF, HTN). She is anticoagulated with Eliquis; this is held on admission in preparation for surgery. Continue metoprolol as tolerated.  Chronic diastolic CHF  Patient appeared to be intravascularly depleted on admission. Gentle IV hydration. TTE (05/05/16) with EF 50-55%, moderate LVH, grade 1 diastolic dysfunction, moderate LAE, mild-mod TR, and moderate increase in PA pressures. Lasix held on admission in light of AKI and apparent dehydration. Strict ins and outs and daily weights.  CAD  No anginal complaints on admission. Pt is s/p CABG and follows with cardiology in outpatient setting. Patient was supposed to have stress test earlier this month, but she tells me that she did not undergo the same. The reason for ordering this test was not clear. Cardiology has seen the patient. No testing ordered at this time.  Hyperbilirubinemia, coagulopathy Reason for hyperbilirubinemia is not entirely clear. We will fractionate. Ultrasound suggests fatty infiltration of the liver. Alkaline phosphatase is also noted to be mildly elevated. CBD is nondistended. Coagulopathy could be due to anticoagulation.  COPD, OSA, chronic respiratory failure  Patient uses 2 Lpm supplemental oxygen at baseline. Respiratory status appears to be stable on admission. Continue supplemental O2, CPAP qHS.  Essential Hypertension Continue Toprol, Norvasc,  and hydralazine as tolerated   DVT Prophylaxis: Anticoagulation on hold for surgery    Code Status: Full code  Family Communication: Discussed with patient  Disposition Plan: Management as outlined above. Plan is for surgery tomorrow.    LOS: 2 days   [facility]ocate [facility] HospitalKRISHNAN,Braelynn Benning  [facility] Pager [phone] 08/04/2016, 7:56 AM  If 7PM-7AM, please contact night-coverage at [email or web address], password [email or web address]

## 2016-08-04 NOTE — Progress Notes (Signed)
Text paged Dr. Rito EhrlichKrishnan am lab results- K+ 5.3,BUN-66,Cr-2.72.

## 2016-08-04 NOTE — Progress Notes (Signed)
   Subjective:   Procedure(s) (LRB): EXTERNAL FIXATION LEFT FEMUR (Left) OPEN REDUCTION INTERNAL FIXATION (ORIF)  FEMUR (Left) Patient reports pain as moderate.  pateint watching a Movie.   Objective: Vital signs in last 24 hours: Temp:  [97.3 F (36.3 C)-98.3 F (36.8 C)] 98.3 F (36.8 C) (01/28 0433) Pulse Rate:  [61-104] 101 (01/28 0433) Resp:  [18-20] 20 (01/28 0433) BP: (90-120)/(44-96) 120/69 (01/28 0433) SpO2:  [95 %-100 %] 97 % (01/28 0433) Weight:  [227 lb 9.6 oz (103.2 kg)] 227 lb 9.6 oz (103.2 kg) (01/28 0433)  Intake/Output from previous day: 01/27 0701 - 01/28 0700 In: 890 [P.O.:120; I.V.:100; Blood:670] Out: 1000 [Urine:1000] Intake/Output this shift: No intake/output data recorded.   Recent Labs  08/02/16 2144 08/03/16 0555 08/04/16 0457  HGB 7.2* 6.3* 8.7*    Recent Labs  08/03/16 0555 08/04/16 0457  WBC 12.9* 11.2*  RBC 2.15* 3.01*  HCT 20.0* 27.1*  PLT 121* 85*    Recent Labs  08/03/16 0412  08/03/16 1147 08/04/16 0457  NA 136  --   --  134*  K 5.7*  < > 5.3* 5.3*  CL 112*  --   --  112*  CO2 17*  --   --  16*  BUN 69*  --   --  66*  CREATININE 2.94*  --   --  2.72*  GLUCOSE 160*  --   --  101*  CALCIUM 8.3*  --   --  8.0*  < > = values in this interval not displayed.  Recent Labs  08/03/16 0412 08/04/16 0457  INR 1.99 1.75    Neurologically intact Ct Renal Stone Study  Result Date: 08/04/2016 CLINICAL DATA:  Hydronephrosis. EXAM: CT ABDOMEN AND PELVIS WITHOUT CONTRAST TECHNIQUE: Multidetector CT imaging of the abdomen and pelvis was performed following the standard protocol without IV contrast. COMPARISON:  Abdominal ultrasound 08/03/2016, CT of the abdomen pelvis 05/05/2016 FINDINGS: Lower chest: No acute abnormality. Hepatobiliary: No focal liver abnormality is seen. Status post cholecystectomy. No biliary dilatation. Pancreas: Unremarkable. No pancreatic ductal dilatation or surrounding inflammatory changes. Spleen: Normal in  size without focal abnormality. Adrenals/Urinary Tract: Bilateral adrenal glands are normal. Mild right renal cortical thinning. Two right renal cysts, the larger of which in the lower pole is partially exophytic and measures 4.6 cm. There is a marked left hydronephrosis without evidence of left hydroureter. No obstructive calculi es seen. Reactive perirenal and periureteral fat stranding is seen. Stomach/Bowel: Stomach is within normal limits. No evidence of appendicitis. No evidence of bowel wall thickening, distention, or inflammatory changes. Vascular/Lymphatic: Aortic atherosclerosis. No enlarged abdominal or pelvic lymph nodes. Reproductive: Status post hysterectomy. No adnexal masses. Other: Small fat containing periumbilical hernia.  Mild anasarca. Musculoskeletal: Lumbosacral spine laminectomy and fusion noted. S shaped thoraco lumbar spine scoliosis with advanced osteoarthritic changes. No evidence of fracture or suspicious osseous lesions. IMPRESSION: Marked left hydronephrosis without evidence of left hydroureter. No obstructive nephrolithiasis is seen. Given these findings obstructive mass at the left UPJ becomes of primarily consideration. The lack of IV contrast precludes proper evaluation of the renal parenchyma and left ureter. Electronically Signed   By: Dobrinka  Dimitrova M.D.   On: 08/04/2016 10:37    Assessment/Plan:   Procedure(s) (LRB): EXTERNAL FIXATION LEFT FEMUR (Left) OPEN REDUCTION INTERNAL FIXATION (ORIF)  FEMUR (Left) Plan:   Clear liquid breakfast in AM then NPO for surgery late Monday afternoon.   Lisa [name] C [name] 08/04/2016, 10:46 AM      

## 2016-08-05 ENCOUNTER — Encounter (HOSPITAL_COMMUNITY)
Admission: EM | Disposition: A | Discharge status: Skilled Nursing Facility | Payer: Self-pay | Source: Home / Self Care | Attending: Internal Medicine

## 2016-08-05 ENCOUNTER — Inpatient Hospital Stay (HOSPITAL_COMMUNITY): Payer: Medicare Other

## 2016-08-05 ENCOUNTER — Inpatient Hospital Stay (HOSPITAL_COMMUNITY): Payer: Medicare Other | Admitting: Certified Registered Nurse Anesthetist

## 2016-08-05 ENCOUNTER — Other Ambulatory Visit: Payer: Self-pay | Admitting: *Deleted

## 2016-08-05 ENCOUNTER — Ambulatory Visit: Payer: Self-pay | Admitting: *Deleted

## 2016-08-05 DIAGNOSIS — N189 Chronic kidney disease, unspecified: Secondary | ICD-10-CM

## 2016-08-05 HISTORY — PX: EXTERNAL FIXATION LEG: SHX1549

## 2016-08-05 HISTORY — PX: CYSTOSCOPY W/ URETERAL STENT PLACEMENT: SHX1429

## 2016-08-05 LAB — PROTIME-INR
INR: 1.42
PROTHROMBIN TIME: 17.5 s — AB (ref 11.4–15.2)

## 2016-08-05 LAB — BASIC METABOLIC PANEL
Anion gap: 10 (ref 5–15)
BUN: 64 mg/dL — AB (ref 6–20)
CALCIUM: 7.9 mg/dL — AB (ref 8.9–10.3)
CHLORIDE: 109 mmol/L (ref 101–111)
CO2: 14 mmol/L — ABNORMAL LOW (ref 22–32)
CREATININE: 2.75 mg/dL — AB (ref 0.44–1.00)
GFR, EST AFRICAN AMERICAN: 18 mL/min — AB (ref >60)
GFR, EST NON AFRICAN AMERICAN: 16 mL/min — AB (ref >60)
Glucose, Bld: 92 mg/dL (ref 65–99)
Potassium: 4.8 mmol/L (ref 3.5–5.1)
SODIUM: 133 mmol/L — AB (ref 135–145)

## 2016-08-05 LAB — CBC
HCT: 25.7 % — ABNORMAL LOW (ref 36.0–46.0)
HEMOGLOBIN: 8.2 g/dL — AB (ref 12.0–15.0)
MCH: 29.1 pg (ref 26.0–34.0)
MCHC: 31.9 g/dL (ref 30.0–36.0)
MCV: 91.1 fL (ref 78.0–100.0)
PLATELETS: 100 10*3/uL — AB (ref 150–400)
RBC: 2.82 MIL/uL — ABNORMAL LOW (ref 3.87–5.11)
RDW: 17.8 % — AB (ref 11.5–15.5)
WBC: 11.7 10*3/uL — ABNORMAL HIGH (ref 4.0–10.5)

## 2016-08-05 LAB — GLUCOSE, CAPILLARY
GLUCOSE-CAPILLARY: 98 mg/dL (ref 65–99)
GLUCOSE-CAPILLARY: 60 mg/dL — AB (ref 65–99)
Glucose-Capillary: 106 mg/dL — ABNORMAL HIGH (ref 65–99)
Glucose-Capillary: 131 mg/dL — ABNORMAL HIGH (ref 65–99)
Glucose-Capillary: 69 mg/dL (ref 65–99)

## 2016-08-05 SURGERY — CYSTOSCOPY, WITH RETROGRADE PYELOGRAM AND URETERAL STENT INSERTION
Anesthesia: General | Laterality: Left

## 2016-08-05 SURGERY — EXTERNAL FIXATION, LOWER EXTREMITY
Anesthesia: General | Laterality: Left

## 2016-08-05 SURGERY — EXTERNAL FIXATION, LOWER EXTREMITY
Anesthesia: General | Site: Leg Upper | Laterality: Left

## 2016-08-05 MED ORDER — DEXTROSE 5 % IV SOLN
INTRAVENOUS | Status: DC | PRN
  Administered 2016-08-05: 3 g via INTRAVENOUS

## 2016-08-05 MED ORDER — DEXTROSE 50 % IV SOLN
INTRAVENOUS | Status: AC
  Administered 2016-08-05: 25 mL via INTRAVENOUS
  Filled 2016-08-05: qty 50

## 2016-08-05 MED ORDER — IOPAMIDOL (ISOVUE-300) INJECTION 61%
INTRAVENOUS | Status: AC
  Filled 2016-08-05: qty 50

## 2016-08-05 MED ORDER — FENTANYL CITRATE (PF) 100 MCG/2ML IJ SOLN: 25.0000 ug | INTRAMUSCULAR | Status: DC | PRN

## 2016-08-05 MED ORDER — SUCCINYLCHOLINE CHLORIDE 20 MG/ML IJ SOLN
INTRAMUSCULAR | Status: DC | PRN
  Administered 2016-08-05: 120 mg via INTRAVENOUS

## 2016-08-05 MED ORDER — STERILE WATER FOR IRRIGATION IR SOLN
Status: DC | PRN
  Administered 2016-08-05: 1000 mL

## 2016-08-05 MED ORDER — GABAPENTIN 300 MG PO CAPS
300.0000 mg | ORAL_CAPSULE | Freq: Every day | ORAL | Status: DC
  Administered 2016-08-06 – 2016-08-09 (×4): 300 mg via ORAL
  Filled 2016-08-05 (×4): qty 1

## 2016-08-05 MED ORDER — PROPOFOL 10 MG/ML IV BOLUS
INTRAVENOUS | Status: AC
Start: 2016-08-05 — End: 2016-08-05
  Filled 2016-08-05: qty 20

## 2016-08-05 MED ORDER — SODIUM CHLORIDE 0.9 % IR SOLN
Status: DC | PRN
  Administered 2016-08-05: 1000 mL

## 2016-08-05 MED ORDER — ONDANSETRON HCL 4 MG/2ML IJ SOLN
INTRAMUSCULAR | Status: DC | PRN
  Administered 2016-08-05: 4 mg via INTRAVENOUS

## 2016-08-05 MED ORDER — INFLUENZA VAC SPLIT QUAD 0.5 ML IM SUSY
0.5000 mL | PREFILLED_SYRINGE | INTRAMUSCULAR | Status: DC
  Filled 2016-08-05: qty 0.5

## 2016-08-05 MED ORDER — PROPOFOL 10 MG/ML IV BOLUS
INTRAVENOUS | Status: DC | PRN
  Administered 2016-08-05: 70 mg via INTRAVENOUS

## 2016-08-05 MED ORDER — ROCURONIUM BROMIDE 50 MG/5ML IV SOSY
PREFILLED_SYRINGE | INTRAVENOUS | Status: AC
  Filled 2016-08-05: qty 10

## 2016-08-05 MED ORDER — IOPAMIDOL (ISOVUE-300) INJECTION 61%
INTRAVENOUS | Status: DC | PRN
  Administered 2016-08-05: 10 mL via URETHRAL

## 2016-08-05 MED ORDER — FENTANYL CITRATE (PF) 100 MCG/2ML IJ SOLN
INTRAMUSCULAR | Status: AC
  Filled 2016-08-05: qty 4

## 2016-08-05 MED ORDER — LIDOCAINE HCL (CARDIAC) 20 MG/ML IV SOLN
INTRAVENOUS | Status: DC | PRN
  Administered 2016-08-05: 50 mg via INTRAVENOUS

## 2016-08-05 MED ORDER — BOOST / RESOURCE BREEZE PO LIQD
1.0000 | Freq: Three times a day (TID) | ORAL | Status: DC
  Administered 2016-08-06 – 2016-08-10 (×9): 1 via ORAL

## 2016-08-05 MED ORDER — EPHEDRINE SULFATE 50 MG/ML IJ SOLN
INTRAMUSCULAR | Status: DC | PRN
  Administered 2016-08-05: 10 mg via INTRAVENOUS

## 2016-08-05 MED ORDER — SODIUM CHLORIDE 0.9 % IV SOLN
INTRAVENOUS | Status: AC
  Administered 2016-08-05 (×2): via INTRAVENOUS

## 2016-08-05 MED ORDER — DEXTROSE 50 % IV SOLN
25.0000 mL | Freq: Once | INTRAVENOUS | Status: AC
  Administered 2016-08-05: 25 mL via INTRAVENOUS
  Filled 2016-08-05: qty 50

## 2016-08-05 MED ORDER — FENTANYL CITRATE (PF) 100 MCG/2ML IJ SOLN
INTRAMUSCULAR | Status: DC | PRN
  Administered 2016-08-05: 100 ug via INTRAVENOUS

## 2016-08-05 MED ORDER — PHENYLEPHRINE HCL 10 MG/ML IJ SOLN
INTRAMUSCULAR | Status: DC | PRN
  Administered 2016-08-05: 40 ug via INTRAVENOUS

## 2016-08-05 MED ORDER — LIDOCAINE 2% (20 MG/ML) 5 ML SYRINGE
INTRAMUSCULAR | Status: AC
  Filled 2016-08-05: qty 5

## 2016-08-05 MED ORDER — SODIUM CHLORIDE 0.45 % IV SOLN
INTRAVENOUS | Status: DC
  Administered 2016-08-06: 03:00:00 via INTRAVENOUS

## 2016-08-05 MED ORDER — ONDANSETRON HCL 4 MG/2ML IJ SOLN: 4.0000 mg | Freq: Once | INTRAMUSCULAR | Status: DC | PRN

## 2016-08-05 SURGICAL SUPPLY — 107 items
ADAPTER CATH URET PLST 4-6FR (CATHETERS) IMPLANT
ADPR CATH URET STRL DISP 4-6FR (CATHETERS)
APL SKNCLS STERI-STRIP NONHPOA (GAUZE/BANDAGES/DRESSINGS)
BAG URINE DRAINAGE (UROLOGICAL SUPPLIES) ×4 IMPLANT
BAG URO CATCHER STRL LF (MISCELLANEOUS) ×4 IMPLANT
BANDAGE ACE 4X5 VEL STRL LF (GAUZE/BANDAGES/DRESSINGS) ×4 IMPLANT
BANDAGE ACE 6X5 VEL STRL LF (GAUZE/BANDAGES/DRESSINGS) ×4 IMPLANT
BANDAGE ESMARK 6X9 LF (GAUZE/BANDAGES/DRESSINGS) ×2 IMPLANT
BAR EXFX 400X11 NS LF (EXFIX) ×4
BAR GLASS FIBER EXFX 11X400 (EXFIX) ×4 IMPLANT
BENZOIN TINCTURE PRP APPL 2/3 (GAUZE/BANDAGES/DRESSINGS) IMPLANT
BIT DRILL 4.3 (BIT) ×9 IMPLANT
BIT DRILL 4.3MM (BIT) ×3
BIT DRILL 4.3X300MM (BIT) IMPLANT
BLADE 10 SAFETY STRL DISP (BLADE) ×4 IMPLANT
BNDG CMPR 9X6 STRL LF SNTH (GAUZE/BANDAGES/DRESSINGS) ×2
BNDG COHESIVE 6X5 TAN STRL LF (GAUZE/BANDAGES/DRESSINGS) ×4 IMPLANT
BNDG ESMARK 6X9 LF (GAUZE/BANDAGES/DRESSINGS) ×4
BNDG GAUZE ELAST 4 BULKY (GAUZE/BANDAGES/DRESSINGS) ×8 IMPLANT
BUCKET BIOHAZARD WASTE 5 GAL (MISCELLANEOUS) ×4 IMPLANT
CAP LOCK NCB (Cap) ×10 IMPLANT
CATH FOLEY 2WAY SLVR  5CC 16FR (CATHETERS) ×2
CATH FOLEY 2WAY SLVR 5CC 16FR (CATHETERS) IMPLANT
CATH INTERMIT  6FR 70CM (CATHETERS) ×2 IMPLANT
CATH URET 5FR 28IN CONE TIP (BALLOONS)
CATH URET 5FR 70CM CONE TIP (BALLOONS) IMPLANT
CLAMP PIN 2 BAR 45MM EXFIX (EXFIX) ×2 IMPLANT
COVER SURGICAL LIGHT HANDLE (MISCELLANEOUS) ×4 IMPLANT
CUFF TOURNIQUET SINGLE 24IN (TOURNIQUET CUFF) IMPLANT
CUFF TOURNIQUET SINGLE 34IN LL (TOURNIQUET CUFF) IMPLANT
DRAPE C-ARM 42X72 X-RAY (DRAPES) IMPLANT
DRAPE C-ARMOR (DRAPES) ×2 IMPLANT
DRAPE CAMERA CLOSED 9X96 (DRAPES) ×8 IMPLANT
DRAPE IMP U-DRAPE 54X76 (DRAPES) ×2 IMPLANT
DRAPE U-SHAPE 47X51 STRL (DRAPES) ×4 IMPLANT
DRILL BIT 4.3 (BIT) ×4
DRSG ADAPTIC 3X8 NADH LF (GAUZE/BANDAGES/DRESSINGS) ×4 IMPLANT
DRSG PAD ABDOMINAL 8X10 ST (GAUZE/BANDAGES/DRESSINGS) ×2 IMPLANT
ELECT REM PT RETURN 9FT ADLT (ELECTROSURGICAL) ×4
ELECTRODE REM PT RTRN 9FT ADLT (ELECTROSURGICAL) ×2 IMPLANT
GAUZE SPONGE 4X4 12PLY STRL (GAUZE/BANDAGES/DRESSINGS) ×4 IMPLANT
GAUZE XEROFORM 5X9 LF (GAUZE/BANDAGES/DRESSINGS) ×2 IMPLANT
GLOVE BIOGEL PI IND STRL 8 (GLOVE) ×4 IMPLANT
GLOVE BIOGEL PI INDICATOR 8 (GLOVE) ×4
GLOVE ORTHO TXT STRL SZ7.5 (GLOVE) ×8 IMPLANT
GLOVE SURG SS PI 6.5 STRL IVOR (GLOVE) ×2 IMPLANT
GOWN STRL REUS W/ TWL LRG LVL3 (GOWN DISPOSABLE) ×6 IMPLANT
GOWN STRL REUS W/ TWL XL LVL3 (GOWN DISPOSABLE) ×2 IMPLANT
GOWN STRL REUS W/TWL 2XL LVL3 (GOWN DISPOSABLE) ×4 IMPLANT
GOWN STRL REUS W/TWL LRG LVL3 (GOWN DISPOSABLE) ×12
GOWN STRL REUS W/TWL XL LVL3 (GOWN DISPOSABLE) ×4
GUIDEWIRE COOK  .035 (WIRE) IMPLANT
GUIDEWIRE STR DUAL SENSOR (WIRE) ×2 IMPLANT
HANDPIECE INTERPULSE COAX TIP (DISPOSABLE) ×4
K-WIRE 2.0 (WIRE) ×12
K-WIRE FXSTD 280X2XNS SS (WIRE) ×6
KIT BASIN OR (CUSTOM PROCEDURE TRAY) ×4 IMPLANT
KIT ROOM TURNOVER OR (KITS) ×8 IMPLANT
KWIRE FXSTD 280X2XNS SS (WIRE) IMPLANT
MANIFOLD NEPTUNE II (INSTRUMENTS) ×4 IMPLANT
NS IRRIG 1000ML POUR BTL (IV SOLUTION) ×12 IMPLANT
PACK CYSTOSCOPY (CUSTOM PROCEDURE TRAY) ×4 IMPLANT
PACK ORTHO EXTREMITY (CUSTOM PROCEDURE TRAY) ×4 IMPLANT
PAD ARMBOARD 7.5X6 YLW CONV (MISCELLANEOUS) ×16 IMPLANT
PADDING CAST COTTON 6X4 STRL (CAST SUPPLIES) ×12 IMPLANT
PENCIL BUTTON HOLSTER BLD 10FT (ELECTRODE) IMPLANT
PIN CLAMP 2BAR 75MM BLUE (EXFIX) ×2 IMPLANT
PIN HALF ORANGE 5X200X45MM (EXFIX) ×4 IMPLANT
PIN HALF YELLOW 5X160X35 (EXFIX) ×4 IMPLANT
PLATE 9H LEFT NCB (Plate) ×2 IMPLANT
PLUG CATH AND CAP STER (CATHETERS) IMPLANT
SCREW 5.0 70MM (Screw) ×2 IMPLANT
SCREW 5.0 80MM (Screw) ×2 IMPLANT
SCREW CORT NCB SELFTAP 5.0X42 (Screw) ×2 IMPLANT
SCREW CORTICAL 5.0X95MM (Screw) ×2 IMPLANT
SCREW NCB 3.5X75X5X6.2XST (Screw) IMPLANT
SCREW NCB 5.0X36MM (Screw) ×4 IMPLANT
SCREW NCB 5.0X38 (Screw) ×4 IMPLANT
SCREW NCB 5.0X46MM (Screw) ×2 IMPLANT
SCREW NCB 5.0X75MM (Screw) ×4 IMPLANT
SCREW NCB 5.0X85MM (Screw) ×2 IMPLANT
SET HNDPC FAN SPRY TIP SCT (DISPOSABLE) ×2 IMPLANT
SPONGE GAUZE 4X4 12PLY STER LF (GAUZE/BANDAGES/DRESSINGS) ×2 IMPLANT
SPONGE LAP 18X18 X RAY DECT (DISPOSABLE) ×6 IMPLANT
SPONGE SCRUB IODOPHOR (GAUZE/BANDAGES/DRESSINGS) ×4 IMPLANT
STAPLER VISISTAT 35W (STAPLE) ×2 IMPLANT
STENT URET 6FRX24 CONTOUR (STENTS) ×2 IMPLANT
STOCKINETTE IMPERVIOUS LG (DRAPES) ×4 IMPLANT
SUT ETHILON 3 0 PS 1 (SUTURE) IMPLANT
SUT VIC AB 0 CT1 27 (SUTURE) ×8
SUT VIC AB 0 CT1 27XBRD ANBCTR (SUTURE) ×4 IMPLANT
SUT VIC AB 1 CT1 27 (SUTURE) ×4
SUT VIC AB 1 CT1 27XBRD ANBCTR (SUTURE) IMPLANT
SUT VIC AB 2-0 CT1 27 (SUTURE) ×8
SUT VIC AB 2-0 CT1 TAPERPNT 27 (SUTURE) ×4 IMPLANT
SYRINGE CONTROL L 12CC (SYRINGE) ×4 IMPLANT
SYRINGE CONTROL LL 12CC (SYRINGE) ×2 IMPLANT
SYRINGE TOOMEY DISP (SYRINGE) IMPLANT
TAPE CLOTH SURG 4X10 WHT LF (GAUZE/BANDAGES/DRESSINGS) ×2 IMPLANT
TOWEL OR 17X24 6PK STRL BLUE (TOWEL DISPOSABLE) ×8 IMPLANT
TOWEL OR 17X26 10 PK STRL BLUE (TOWEL DISPOSABLE) ×4 IMPLANT
TUBE CONNECTING 12'X1/4 (SUCTIONS)
TUBE CONNECTING 12X1/4 (SUCTIONS) IMPLANT
UNDERPAD 30X30 (UNDERPADS AND DIAPERS) ×8 IMPLANT
WATER STERILE IRR 1000ML POUR (IV SOLUTION) ×12 IMPLANT
WIRE COONS/BENSON .038X145CM (WIRE) IMPLANT
YANKAUER SUCT BULB TIP NO VENT (SUCTIONS) ×2 IMPLANT

## 2016-08-05 NOTE — Patient Outreach (Signed)
[facility] Care Management  08/05/2016  Lisa [name] Jul 11, 1940 [id number]   Referral received from social worker on Friday, 1/26, requesting start of transition of care program and case management.  Noted that member was readmitted back to the hospital late Friday night.  Hospital liaison and LCSW notified.  Will follow up after discharge.  Kemper DurieMonica Bruchy Mikel, CaliforniaRN, MSN [facility] Care Management  [facility] Care Manager [phone]

## 2016-08-05 NOTE — Interval H&P Note (Signed)
History and Physical Interval Note:  08/05/2016 8:04 PM  Lisa [name]  has presented today for surgery, with the diagnosis of Left Femur Fracture  The various methods of treatment have been discussed with the patient and family. After consideration of risks, benefits and other options for treatment, the patient has consented to  Procedure(s): EXTERNAL FIXATION FEMUR, ORIF FEMUR (Left) CYSTOSCOPY WITH RETROGRADE PYELOGRAM/URETERAL STENT PLACEMENT (Left) as a surgical intervention .  The patient's history has been reviewed, patient examined, no change in status, stable for surgery.  I have reviewed the patient's chart and labs.  Questions were answered to the patient's satisfaction.     Eldred MangesMark C Allante Whitmire

## 2016-08-05 NOTE — Transfer of Care (Signed)
Immediate Anesthesia Transfer of Care Note  Patient: Lisa [name]  Procedure(s) Performed: Procedure(s): EXTERNAL FIXATION FEMUR, ORIF FEMUR (Left) CYSTOSCOPY WITH RETROGRADE PYELOGRAM/URETERAL STENT PLACEMENT (Left)  Patient Location: PACU  Anesthesia Type:General  Level of Consciousness: responds to stimulation  Airway & Oxygen Therapy: Patient Spontanous Breathing and Patient connected to nasal cannula oxygen  Post-op Assessment: Report given to RN and Post -op Vital signs reviewed and stable  Post vital signs: Reviewed and stable  Last Vitals:  Vitals:   08/05/16 0428 08/05/16 1542  BP: 134/78 129/77  Pulse: 84 92  Resp: 18 18  Temp: 37 C 36.8 C    Last Pain:  Vitals:   08/05/16 1542  TempSrc: Oral  PainSc:       Patients Stated Pain Goal: 3 (08/05/16 1416)  Complications: No apparent anesthesia complications

## 2016-08-05 NOTE — Progress Notes (Addendum)
[facility] PROGRESS NOTE  Lisa [name] ZOX:[id number] DOB: Jul 02, 1941 DOA: 08/02/2016  PCP: Laurena Slimmer, MD  Brief History/Interval Summary: 76 year old female with medical history significant for atrial fibrillation on Eliquis, hypertension, CAD status post CABG, COPD with chronic respiratory failure, OSA, and chronic diastolic CHF who presents to the emergency department from her nursing facility for evaluation of severe left leg pain following a fall. Patient was evaluated in the emergency department. Found to have a left supracondylar fracture of the femur along with an oblique fracture involving the distal diaphysis of the left fibula. Patient was also noted to be anemic. Hospitalized for further management. Patient was seen by orthopedics. Patient is high risk for perioperative mortality. She was seen by cardiology. Patient also found to have left-sided hydronephrosis for which patient was seen by urology.  Reason for Visit: Left Supracondylar femur fracture  Consultants: Orthopedics. Cardiology. Urology  Procedures:  Plan is for surgery to the left leg sometime today. There is also plan for stent placement for the left hydronephrosis at the same time.  Antibiotics: None  Subjective/Interval History: Patient denies any pain this morning. Denies any shortness of breath. Waiting to undergo her surgery.   ROS: Denies nausea or vomiting.   Objective:  Vital Signs  Vitals:   08/04/16 1404 08/04/16 2146 08/04/16 2302 08/05/16 0428  BP: 103/61 (!) 100/44 124/72 134/78  Pulse: 99 (!) 106 95 84  Resp: 18 18  18   Temp: 98.4 F (36.9 C) 97.5 F (36.4 C)  98.6 F (37 C)  TempSrc: Oral Oral  Oral  SpO2: 100% 96%  96%  Weight:    103.7 kg (228 lb 11.2 oz)  Height:        Intake/Output Summary (Last 24 hours) at 08/05/16 0732 Last data filed at 08/05/16 0700  Gross per 24 hour  Intake              480 ml  Output              650 ml  Net             -170 ml    Filed Weights   08/02/16 1854 08/04/16 0433 08/05/16 0428  Weight: (!) 145.2 kg (320 lb) 103.2 kg (227 lb 9.6 oz) 103.7 kg (228 lb 11.2 oz)    General appearance: alert, cooperative, appears stated age and no distress Resp: clear to auscultation bilaterally Cardio: regular rate and rhythm, S1, S2 normal, no murmur, click, rub or gallop GI: soft, non-tender; bowel sounds normal; no masses,  no organomegaly. Foley catheter is noted. Extremities: Left lower extremity in a brace Neurologic: Alert and oriented X 3, no focal neurological deficits.  Lab Results:  Data Reviewed: I have personally reviewed following labs and imaging studies  CBC:  Recent Labs Lab 08/02/16 2144 08/03/16 0555 08/04/16 0457 08/05/16 0516  WBC 13.1* 12.9* 11.2* 11.7*  NEUTROABS 11.0* 9.8*  --   --   HGB 7.2* 6.3* 8.7* 8.2*  HCT 23.0* 20.0* 27.1* 25.7*  MCV 93.1 93.0 90.0 91.1  PLT 138* 121* 85* 100*    Basic Metabolic Panel:  Recent Labs Lab 08/02/16 2144  08/03/16 0412 08/03/16 0744 08/03/16 1147 08/04/16 0457 08/05/16 0516  NA 135  --  136  --   --  134* 133*  K 5.5*  < > 5.7* 5.6* 5.3* 5.3* 4.8  CL 111  --  112*  --   --  112* 109  CO2 14*  --  17*  --   --  16* 14*  GLUCOSE 160*  --  160*  --   --  101* 92  BUN 73*  --  69*  --   --  66* 64*  CREATININE 3.11*  --  2.94*  --   --  2.72* 2.75*  CALCIUM 8.5*  --  8.3*  --   --  8.0* 7.9*  < > = values in this interval not displayed.  GFR: Estimated Creatinine Clearance: 20.1 mL/min (by C-G formula based on SCr of 2.75 mg/dL (H)).  Liver Function Tests:  Recent Labs Lab 08/02/16 2144 08/03/16 0412 08/04/16 0457 08/04/16 1404  AST 58* 56* 53*  --   ALT 39 40 35  --   ALKPHOS 181* 181* 152*  --   BILITOT 2.4* 2.7* 2.6* 2.4*  PROT 7.1 6.6 6.5  --   ALBUMIN 1.8* 1.6* 1.7*  --     Coagulation Profile:  Recent Labs Lab 08/02/16 2144 08/03/16 0412 08/04/16 0457 08/05/16 0516  INR 2.00 1.99 1.75 1.42    CBG:  Recent  Labs Lab 08/03/16 0736 08/04/16 0800  GLUCAP 130* 72    Anemia Panel:  Recent Labs  08/02/16 2352  VITAMINB12 615  FOLATE 10.3  FERRITIN 1,086*  TIBC 175*  IRON 35  RETICCTPCT 3.8*    Recent Results (from the past 240 hour(s))  MRSA PCR Screening     Status: None   Collection Time: 08/03/16  4:43 AM  Result Value Ref Range Status   MRSA by PCR NEGATIVE NEGATIVE Final    Comment:        The GeneXpert MRSA Assay (FDA approved for NASAL specimens only), is one component of a comprehensive MRSA colonization surveillance program. It is not intended to diagnose MRSA infection nor to guide or monitor treatment for MRSA infections.       Radiology Studies: Ct Renal Stone Study  Result Date: 08/04/2016 CLINICAL DATA:  Hydronephrosis. EXAM: CT ABDOMEN AND PELVIS WITHOUT CONTRAST TECHNIQUE: Multidetector CT imaging of the abdomen and pelvis was performed following the standard protocol without IV contrast. COMPARISON:  Abdominal ultrasound 08/03/2016, CT of the abdomen pelvis 05/05/2016 FINDINGS: Lower chest: No acute abnormality. Hepatobiliary: No focal liver abnormality is seen. Status post cholecystectomy. No biliary dilatation. Pancreas: Unremarkable. No pancreatic ductal dilatation or surrounding inflammatory changes. Spleen: Normal in size without focal abnormality. Adrenals/Urinary Tract: Bilateral adrenal glands are normal. Mild right renal cortical thinning. Two right renal cysts, the larger of which in the lower pole is partially exophytic and measures 4.6 cm. There is a marked left hydronephrosis without evidence of left hydroureter. No obstructive calculi es seen. Reactive perirenal and periureteral fat stranding is seen. Stomach/Bowel: Stomach is within normal limits. No evidence of appendicitis. No evidence of bowel wall thickening, distention, or inflammatory changes. Vascular/Lymphatic: Aortic atherosclerosis. No enlarged abdominal or pelvic lymph nodes. Reproductive:  Status post hysterectomy. No adnexal masses. Other: Small fat containing periumbilical hernia.  Mild anasarca. Musculoskeletal: Lumbosacral spine laminectomy and fusion noted. S shaped thoraco lumbar spine scoliosis with advanced osteoarthritic changes. No evidence of fracture or suspicious osseous lesions. IMPRESSION: Marked left hydronephrosis without evidence of left hydroureter. No obstructive nephrolithiasis is seen. Given these findings obstructive mass at the left UPJ becomes of primarily consideration. The lack of IV contrast precludes proper evaluation of the renal parenchyma and left ureter. Electronically Signed   By: Ted Mcalpine M.D.   On: 08/04/2016 10:37     Medications:  Scheduled: .  amLODipine  5 mg Oral Daily  . atorvastatin  10 mg Oral q1800  . budesonide (PULMICORT) nebulizer solution  1 mg Nebulization BID  . buPROPion  150 mg Oral Daily  . docusate sodium  100 mg Oral BID  . [START ON 08/06/2016] gabapentin  300 mg Oral QHS  . hydrALAZINE  25 mg Oral Q8H  . isosorbide mononitrate  30 mg Oral Daily  . metoprolol succinate  50 mg Oral Daily  . pantoprazole  40 mg Oral Daily  . povidone-iodine  2 application Topical Once  . sodium chloride flush  3 mL Intravenous Q12H   Continuous: . sodium chloride 75 mL/hr at 08/04/16 1053   JYN:[id number]:acetaminophen **OR** acetaminophen, bisacodyl, HYDROcodone-acetaminophen, ipratropium-albuterol, morphine injection, ondansetron **OR** ondansetron (ZOFRAN) IV, polyethylene glycol  Assessment/Plan:  Principal Problem:   Femur fracture, left (HCC) Active Problems:   GERD (gastroesophageal reflux disease)   Hypertension   Chronic diastolic heart failure (HCC)   OSA (obstructive sleep apnea)   Chronic respiratory failure with hypoxia (HCC)   Pressure sore   Anemia, normocytic normochromic   CAD (coronary artery disease)   Fibula fracture   Coagulopathy (HCC)   Hypertensive heart disease    Acute left femur fracture, left  fibula fracture  This was as a result of a mechanical fall. Orthopedic surgery has seen the patient. Anticoagulation has been discontinued for planned surgery. Due to patient's complicated cardiac history, cardiology was consulted to provide a preoperative assessment. Patient is high-risk for perioperative mortality, with risk of perioperative cardiac complications 3-4% per Amie CritchleyGupta, et.al.. Orthopedics plans to take her to the OR Today.  AKI superimposed on CKD stage III with hyperkalemia/left-sided hydronephrosis Patient's baseline appears to be between 1.5 and 2.0. Creatinine was 3.1 on admission. Patient appeared to be intravascularly depleted. She was given IV fluids. Renal function appears to have plateaued. She was given her some more IV fluids yesterday. Evaluation has revealed left-sided hydronephrosis with concern for obstruction at UPJ. Urology has seen the patient and plan is for stent placement later today. This is likely contributing to persistent elevation in creatinine. Urine output is present but low. Continue to monitor for now. If there is no improvement despite stent placement may need to involve nephrology. No evidence for volume overload at this time. Cut back on IV fluids. Potassium has improved.  Normocytic anemia/thrombocytopenia  Hgb is 7.2 on admission; recent priors have been in 7-9 range. Hemoglobin did drop to 6.7 on 1/27. She was transfused 2 units of blood. Hemoglobin has improved. Continue to monitor. Low platelet counts noted , but stable. No overt bleeding.  Paroxysmal atrial fibrillation Appears to be in atrial fibrillation. EKG from yesterday also showed atrial fibrillation. CHADS-VASc at least 355 (age x2, gender, CHF, HTN). She is anticoagulated with Eliquis; this was held on admission in preparation for surgery. Continue metoprolol as tolerated.   Chronic diastolic CHF  Patient appeared to be intravascularly depleted on admission. Gentle IV hydration. TTE  (05/05/16) with EF 50-55%, moderate LVH, grade 1 diastolic dysfunction, moderate LAE, mild-mod TR, and moderate increase in PA pressures. Lasix held on admission in light of AKI and apparent dehydration. Strict ins and outs and daily weights.  CAD  No anginal complaints on admission. Pt is s/p CABG and follows with cardiology in outpatient setting. Patient was supposed to have stress test earlier this month, but she tells me that she did not undergo the same. The reason for ordering this test was not clear. Cardiology has seen the  patient. No testing ordered at this time.  Hyperbilirubinemia, coagulopathy Reason for hyperbilirubinemia is not entirely clear. Bilirubin was fractionated and appears to be equal, direct and indirect. Ultrasound suggests fatty infiltration of the liver. Alkaline phosphatase is also noted to be mildly elevated. CBD is nondistended. Coagulopathy could have been due to anticoagulation.  COPD, OSA, chronic respiratory failure  Patient uses 2 Lpm supplemental oxygen at baseline. Respiratory status appears to be stable on admission. Continue supplemental O2, CPAP qHS.  Essential Hypertension Continue Toprol, Norvasc, and hydralazine as tolerated   DVT Prophylaxis: Anticoagulation on hold for surgery    Code Status: Full code  Family Communication: Discussed with patient  Disposition Plan: Management as outlined above. Plan is for surgery later today.    LOS: 3 days   [facility]  [facility] Pager [phone] 08/05/2016, 7:32 AM  If 7PM-7AM, please contact night-coverage at [email or web address], password [facility]

## 2016-08-05 NOTE — Progress Notes (Signed)
Text paged Dr. Dorris CarnesKishnan patient is in A-fib

## 2016-08-05 NOTE — Progress Notes (Signed)
Report called to short stay. 

## 2016-08-05 NOTE — Brief Op Note (Signed)
08/02/2016 - 08/05/2016  10:09 PM  PATIENT:  Lisa [name]  76 y.o. female  PRE-OPERATIVE DIAGNOSIS:  Left Femur Fracture  POST-OPERATIVE DIAGNOSIS:  * No post-op diagnosis entered *  PROCEDURE:  Procedure(s): EXTERNAL FIXATION FEMUR, ORIF FEMUR (Left) CYSTOSCOPY WITH RETROGRADE PYELOGRAM/URETERAL STENT PLACEMENT (Left)  SURGEON:  Surgeon(s) and Role: Panel 1:    * Eldred MangesMark C Kattleya Kuhnert, MD - Primary  Panel 2:    * Marcine MatarStephen Dahlstedt, MD - Primary  PHYSICIAN ASSISTANT:   ASSISTANTS: none   ANESTHESIA:   none  EBL:  No intake/output data recorded.  BLOOD ADMINISTERED:none  DRAINS: none   LOCAL MEDICATIONS USED:  NONE  SPECIMEN:  No Specimen  DISPOSITION OF SPECIMEN:  N/A  COUNTS:  YES  TOURNIQUET:  * No tourniquets in log *  DICTATION: .Dragon Dictation  PLAN OF CARE: already IP  PATIENT DISPOSITION:  PACU - hemodynamically stable.   Delay start of Pharmacological VTE agent (>24hrs) due to surgical blood loss or risk of bleeding: yes

## 2016-08-05 NOTE — Op Note (Signed)
Preop diagnosis left supracondylar distal femur fracture without intercondylar extension  Postop diagnosis: Same  Procedure: External fixator application, ORIF lateral Biomet femoral plating, removal of external fixator. (9 hole anatomic lateral femoral plate.  Anesthesia is general  EBL :300 mL  Drains none the  After induction of general anesthesia operculum intubation none 3 g Ancef prophylaxis timeout procedure after prepping the up to the groin with the DuraPrep from ankle impervious stockinette Caban and extremity sheets and drapes. Stab incision was made and a sterile fixator was applied anteriorly in the cortices of the femur and into the tibia spanning the knee joint with patient had total knee arthroplasty. Patient has supracondylar comminuted fracture had extremely soft bone. She's had multiple medical problems including long-term obesity, renal failure, and congestive heart failure. Patient had atrial fib and was on Eliquis  when she was admitted and was immobilized until t oral blood thinner had worn off.  Under the C-arm visualization traction was applied distracted using external fixator alignment multiple reduction maneuvers were made until was overall satisfactory alignment out to length. Incision was made laterally Bovie was used. 9 inch plate was selected held with K wire just a several times held with a self-retaining clamp against cortex of the femur. Once it was pinned adjusted screw was placed proximally through the lateral guide and then the awl distal holes were screwed weightbearing length the screws from 70 to the 90 mm in length. Bicortical screws were placed with the 3 most proximal all's leaving 3 holes open then 3 in the distal diaphyseal region for total 6 screws proximally. There is piece of bone along the medial aspect of the kicked up and a cut was slowed along the anterior aspect of the femur just above the knee prosthesis and cortical piece was pushed back down.  Patient's bones were so soft that no cortices the Benadryl edges directly inserted the screws. Irrigation copiously closure with the #1 Vicryl suture to the iliotibial band 2-0 Vicryl subtendinous tissue skin staple closure. Patsey BertholdKing King AntonitoKong tongs was used to help hold the plate distally against the femur. Patient tolerated the procedure well and then will have some cystoscopy performed under the same anesthetic setting please see Dr. Gladstone Pihalstedt's note

## 2016-08-05 NOTE — Progress Notes (Signed)
Initial Nutrition Assessment  DOCUMENTATION CODES:   Morbid obesity  INTERVENTION:  Boost Breeze po TID, each supplement provides 250 kcal and 9 grams of protein  NUTRITION DIAGNOSIS:   Increased nutrient needs related to wound healing, catabolic illness as evidenced by increased estimated needs from protein.  GOAL:   Patient will meet greater than or equal to 90% of their needs  MONITOR:   PO intake, Supplement acceptance, Diet advancement, Labs, Skin  REASON FOR ASSESSMENT:   Low Braden    ASSESSMENT:   76 year old female with medical history significant for atrial fibrillation on Eliquis, hypertension, CAD status post CABG, COPD with chronic respiratory failure, OSA, and chronic diastolic CHF who presents to the emergency department from her nursing facility for evaluation of severe left leg pain following a fall. Patient was evaluated in the emergency department. Found to have a left supracondylar fracture of the femur along with an oblique fracture involving the distal diaphysis of the left fibula.   Met with pt in room today. Pt reports poor appetite for 3 days pta. Pt is currently eating <25% meals. Per chart, pt has lost 14lbs(6%) in about 7 weeks. This is significant given history. Pt with multiple pressure injuries. Pt with AKI/CKD III. RD will order Boost Breeze. Consider Premier protein once diet advanced. Pt currently NPO for surgery today to repair femur fracture.   Medications reviewed and include: colace, protonix  Labs reviewed: Na 133(L), C02 14(L), BUN 64(H), creat 2.75(H), Ca 7.9(L) adj. 9.74 wnl, Alb 1.7(L) 1/28, Alk Phos 152(H) 1/28, AST 53(H) 1/28, tbili 2.4(H) 1/28 Wbc- 11.7(H), Hgb 8.2(L), Hct 25.7(L)  Nutrition-Focused physical exam completed. Findings are no fat depletion, no muscle depletion, and no edema.   Diet Order:  Diet NPO time specified  Skin:  Wound (see comment) (Stage II sacrum, Stage II buttocks)  Last BM:  1/26  Height:   Ht  Readings from Last 1 Encounters:  08/03/16 _0  (1.626 m)    Weight:   Wt Readings from Last 1 Encounters:  08/05/16 228 lb 11.2 oz (103.7 kg)    Ideal Body Weight:  54.5 kg  BMI:  Body mass index is 39.26 kg/m.  Estimated Nutritional Needs:   Kcal:  1600-1900kcal/day   Protein:  104-124g/day   Fluid:  >1.6L/day   EDUCATION NEEDS:   No education needs identified at this time  Koleen Distance, RD, LDN Pager #[phone]

## 2016-08-05 NOTE — Op Note (Signed)
Preoperative diagnosis: Left UPJ obstruction.  Postoperative diagnosis: Same, para principal procedure: Cystoscopy, left retrograde ureteropyelogram, fluoroscopic interpretation, placement of 6 French by 24 centimeter contour double-J stent without tether  Surgeon: Ahri Olson  Anesthesia: Gen. endotracheal.  Complications: None  Specimen: Left renal urine specimen for culture.  Drains: 16 French Foley catheter, 6 [place]French by 24 centimeter contour double-J stent.  Estimated blood loss: None.  Indications: 76 year old female with recently discovered left UPJ obstruction with subsequent hydronephrosis and acute renal failure.  The patient presented with a fractured left femur.  She is on the medicine service.  She has just undergone open reduction internal fixation of her left femur fracture by Dr. Annell GreeningMark Yates.  Because of her left hydronephrosis secondary to UPJ obstruction, she presents at this time to the operating room for cystoscopy, retrograde ureteropyelogram and double-J stent placement.  Findings: The patient had a normal-appearing bladder.  Ureteral orifices were normal in configuration and location.  There were no urothelial lesions within the bladder.  Retrograde ureteropyelogram revealed a totally normal ureter.  There was a significant UPJ obstruction, with no contrast admitted into the renal pelvis.  Description of procedure: The patient was identified and properly marked in the holding area.  She had received 3 grams of Ancef intravenously for the orthopedic procedure.  Dr. Annell GreeningMark Yates completed his left femur surgery.  At this time, the patient was switched to the cystoscopy table.  She was placed in the dorsolithotomy position.  Genitalia and perineum were prepped and draped.  A second timeout was performed.  A 21 French panendoscope was advanced into the bladder which was inspected circumferentially with normal findings.  The left ureteral orifice was cannulated with a 6 [place]French  open-ended catheter.  Retrograde ureteropyelogram was performed with Omnipaque.  This revealed the normal.  Ureteral findings.  There was no contrast admitted into the left renal pelvis due to the very tight ureteropelvic junction obstruction.  I then advanced the guidewire, a 0.038 inch sensor-tip, through the open-ended catheter, into the left renal pelvis with a curl seen in the upper pole calyceal system.  I advanced the open-ended catheter over top of the guidewire.  Once it was positioned into what I felt was the renal pelvis, the guidewire was removed.  Bloody hydronephrotic drip was obtained, this urine was sent for culture.  I then replaced the guidewire through the open-ended catheter, and then removed the open-ended catheter was guidewire was positioned adequately.  I then passed a 24 centimeter by 6 [place]French contour double-J stent over top of the guidewire with the proximal end positioned in the renal pelvic area.  Once this was seen, the guidewire was removed.  Good proximal and distal curl was seen using fluoroscopic and cystoscopic guidance, respectively.  The scope was then removed.  I passed a 16 JamaicaFrench Foley catheter.  The bladder with 10 mL of water placed in the balloon.  This was then hooked to a bedside bag.  At this point, the procedure was terminated.  The patient was awakened and taken to the PACU in stable condition.  She tolerated procedure well.

## 2016-08-05 NOTE — Progress Notes (Signed)
On arrival to short stay while starting IV patient not following commands and stated that she felt like her blood sugar was low. Checked CBG and treated. Patient now felling better and following commands. [facility] to [facility], Scientist, clinical (histocompatibility and immunogenetics)CRNA. Notified Harriett SineNancy, RN on 6N of incident.

## 2016-08-05 NOTE — Anesthesia Procedure Notes (Signed)
Procedure Name: Intubation Date/Time: 08/05/2016 8:21 PM Performed by: Manuela Schwartz B Pre-anesthesia Checklist: Patient identified, Emergency Drugs available, Suction available, Patient being monitored and Timeout performed Patient Re-evaluated:Patient Re-evaluated prior to inductionOxygen Delivery Method: Circle system utilized Preoxygenation: Pre-oxygenation with 100% oxygen Intubation Type: IV induction, Rapid sequence and Cricoid Pressure applied Laryngoscope Size: Mac and 3 Grade View: Grade I Tube type: Oral Tube size: 7.5 mm Number of attempts: 1 Airway Equipment and Method: Stylet Placement Confirmation: ETT inserted through vocal cords under direct vision,  positive ETCO2 and breath sounds checked- equal and bilateral Secured at: 21 cm Tube secured with: Tape Dental Injury: Teeth and Oropharynx as per pre-operative assessment

## 2016-08-05 NOTE — H&P (View-Only) (Signed)
   Subjective:   Procedure(s) (LRB): EXTERNAL FIXATION LEFT FEMUR (Left) OPEN REDUCTION INTERNAL FIXATION (ORIF)  FEMUR (Left) Patient reports pain as moderate.  pateint watching a Movie.   Objective: Vital signs in last 24 hours: Temp:  [97.3 F (36.3 C)-98.3 F (36.8 C)] 98.3 F (36.8 C) (01/28 0433) Pulse Rate:  [61-104] 101 (01/28 0433) Resp:  [18-20] 20 (01/28 0433) BP: (90-120)/(44-96) 120/69 (01/28 0433) SpO2:  [95 %-100 %] 97 % (01/28 0433) Weight:  [227 lb 9.6 oz (103.2 kg)] 227 lb 9.6 oz (103.2 kg) (01/28 0433)  Intake/Output from previous day: 01/27 0701 - 01/28 0700 In: 890 [P.O.:120; I.V.:100; Blood:670] Out: 1000 [Urine:1000] Intake/Output this shift: No intake/output data recorded.   Recent Labs  08/02/16 2144 08/03/16 0555 08/04/16 0457  HGB 7.2* 6.3* 8.7*    Recent Labs  08/03/16 0555 08/04/16 0457  WBC 12.9* 11.2*  RBC 2.15* 3.01*  HCT 20.0* 27.1*  PLT 121* 85*    Recent Labs  08/03/16 0412  08/03/16 1147 08/04/16 0457  NA 136  --   --  134*  K 5.7*  < > 5.3* 5.3*  CL 112*  --   --  112*  CO2 17*  --   --  16*  BUN 69*  --   --  66*  CREATININE 2.94*  --   --  2.72*  GLUCOSE 160*  --   --  101*  CALCIUM 8.3*  --   --  8.0*  < > = values in this interval not displayed.  Recent Labs  08/03/16 0412 08/04/16 0457  INR 1.99 1.75    Neurologically intact Ct Renal Stone Study  Result Date: 08/04/2016 CLINICAL DATA:  Hydronephrosis. EXAM: CT ABDOMEN AND PELVIS WITHOUT CONTRAST TECHNIQUE: Multidetector CT imaging of the abdomen and pelvis was performed following the standard protocol without IV contrast. COMPARISON:  Abdominal ultrasound 08/03/2016, CT of the abdomen pelvis 05/05/2016 FINDINGS: Lower chest: No acute abnormality. Hepatobiliary: No focal liver abnormality is seen. Status post cholecystectomy. No biliary dilatation. Pancreas: Unremarkable. No pancreatic ductal dilatation or surrounding inflammatory changes. Spleen: Normal in  size without focal abnormality. Adrenals/Urinary Tract: Bilateral adrenal glands are normal. Mild right renal cortical thinning. Two right renal cysts, the larger of which in the lower pole is partially exophytic and measures 4.6 cm. There is a marked left hydronephrosis without evidence of left hydroureter. No obstructive calculi es seen. Reactive perirenal and periureteral fat stranding is seen. Stomach/Bowel: Stomach is within normal limits. No evidence of appendicitis. No evidence of bowel wall thickening, distention, or inflammatory changes. Vascular/Lymphatic: Aortic atherosclerosis. No enlarged abdominal or pelvic lymph nodes. Reproductive: Status post hysterectomy. No adnexal masses. Other: Small fat containing periumbilical hernia.  Mild anasarca. Musculoskeletal: Lumbosacral spine laminectomy and fusion noted. S shaped thoraco lumbar spine scoliosis with advanced osteoarthritic changes. No evidence of fracture or suspicious osseous lesions. IMPRESSION: Marked left hydronephrosis without evidence of left hydroureter. No obstructive nephrolithiasis is seen. Given these findings obstructive mass at the left UPJ becomes of primarily consideration. The lack of IV contrast precludes proper evaluation of the renal parenchyma and left ureter. Electronically Signed   By: Ted Mcalpineobrinka  Dimitrova M.D.   On: 08/04/2016 10:37    Assessment/Plan:   Procedure(s) (LRB): EXTERNAL FIXATION LEFT FEMUR (Left) OPEN REDUCTION INTERNAL FIXATION (ORIF)  FEMUR (Left) Plan:   Clear liquid breakfast in AM then NPO for surgery late Monday afternoon.   Eldred MangesMark C Timur Nibert 08/04/2016, 10:46 AM

## 2016-08-06 ENCOUNTER — Encounter (HOSPITAL_COMMUNITY): Payer: Self-pay | Admitting: Orthopaedic Surgery

## 2016-08-06 DIAGNOSIS — I481 Persistent atrial fibrillation: Secondary | ICD-10-CM

## 2016-08-06 DIAGNOSIS — I5032 Chronic diastolic (congestive) heart failure: Secondary | ICD-10-CM

## 2016-08-06 DIAGNOSIS — Z7901 Long term (current) use of anticoagulants: Secondary | ICD-10-CM

## 2016-08-06 LAB — TYPE AND SCREEN
ABO/RH(D): A POS
ANTIBODY SCREEN: POSITIVE
DAT, IgG: NEGATIVE
UNIT DIVISION: 0
UNIT DIVISION: 0
Unit division: 0
Unit division: 0

## 2016-08-06 LAB — COMPREHENSIVE METABOLIC PANEL
ALBUMIN: 1.5 g/dL — AB (ref 3.5–5.0)
ALT: 20 U/L (ref 14–54)
ANION GAP: 8 (ref 5–15)
AST: 36 U/L (ref 15–41)
Alkaline Phosphatase: 108 U/L (ref 38–126)
BUN: 62 mg/dL — ABNORMAL HIGH (ref 6–20)
CALCIUM: 7.9 mg/dL — AB (ref 8.9–10.3)
CO2: 18 mmol/L — AB (ref 22–32)
CREATININE: 2.71 mg/dL — AB (ref 0.44–1.00)
Chloride: 111 mmol/L (ref 101–111)
GFR calc Af Amer: 18 mL/min — ABNORMAL LOW (ref >60)
GFR calc non Af Amer: 16 mL/min — ABNORMAL LOW (ref >60)
GLUCOSE: 88 mg/dL (ref 65–99)
Potassium: 4.9 mmol/L (ref 3.5–5.1)
SODIUM: 137 mmol/L (ref 135–145)
Total Bilirubin: 1.9 mg/dL — ABNORMAL HIGH (ref 0.3–1.2)
Total Protein: 5.8 g/dL — ABNORMAL LOW (ref 6.5–8.1)

## 2016-08-06 LAB — BLOOD GAS, ARTERIAL
ACID-BASE DEFICIT: 9.5 mmol/L — AB (ref 0.0–2.0)
Bicarbonate: 15.9 mmol/L — ABNORMAL LOW (ref 20.0–28.0)
O2 CONTENT: 2 L/min
O2 SAT: 98.6 %
PATIENT TEMPERATURE: 98.2
PH ART: 7.288 — AB (ref 7.350–7.450)
pCO2 arterial: 34.1 mmHg (ref 32.0–48.0)
pO2, Arterial: 115 mmHg — ABNORMAL HIGH (ref 83.0–108.0)

## 2016-08-06 LAB — GLUCOSE, CAPILLARY
Glucose-Capillary: 170 mg/dL — ABNORMAL HIGH (ref 65–99)
Glucose-Capillary: 86 mg/dL (ref 65–99)
Glucose-Capillary: 99 mg/dL (ref 65–99)

## 2016-08-06 LAB — CBC
HCT: 21.3 % — ABNORMAL LOW (ref 36.0–46.0)
HEMOGLOBIN: 6.9 g/dL — AB (ref 12.0–15.0)
MCH: 30.1 pg (ref 26.0–34.0)
MCHC: 32.4 g/dL (ref 30.0–36.0)
MCV: 93 fL (ref 78.0–100.0)
Platelets: 113 10*3/uL — ABNORMAL LOW (ref 150–400)
RBC: 2.29 MIL/uL — AB (ref 3.87–5.11)
RDW: 18.5 % — ABNORMAL HIGH (ref 11.5–15.5)
WBC: 14.3 10*3/uL — ABNORMAL HIGH (ref 4.0–10.5)

## 2016-08-06 LAB — PROTIME-INR
INR: 1.46
PROTHROMBIN TIME: 17.9 s — AB (ref 11.4–15.2)

## 2016-08-06 LAB — PREPARE RBC (CROSSMATCH)

## 2016-08-06 MED ORDER — HYDROMORPHONE HCL 2 MG/ML IJ SOLN: 0.5000 mg | INTRAMUSCULAR | Status: DC | PRN

## 2016-08-06 MED ORDER — ACETAMINOPHEN 650 MG RE SUPP: 650.0000 mg | Freq: Four times a day (QID) | RECTAL | Status: DC | PRN

## 2016-08-06 MED ORDER — PHENOL 1.4 % MT LIQD: 1.0000 | OROMUCOSAL | Status: DC | PRN

## 2016-08-06 MED ORDER — ACETAMINOPHEN 325 MG PO TABS
650.0000 mg | ORAL_TABLET | Freq: Four times a day (QID) | ORAL | Status: DC | PRN
  Administered 2016-08-08: 650 mg via ORAL
  Filled 2016-08-06: qty 2

## 2016-08-06 MED ORDER — MORPHINE SULFATE (PF) 2 MG/ML IV SOLN: 0.5000 mg | INTRAVENOUS | Status: DC | PRN

## 2016-08-06 MED ORDER — ONDANSETRON HCL 4 MG/2ML IJ SOLN: 4.0000 mg | Freq: Four times a day (QID) | INTRAMUSCULAR | Status: DC | PRN

## 2016-08-06 MED ORDER — ONDANSETRON HCL 4 MG PO TABS: 4.0000 mg | ORAL_TABLET | Freq: Four times a day (QID) | ORAL | Status: DC | PRN

## 2016-08-06 MED ORDER — METOCLOPRAMIDE HCL 5 MG/ML IJ SOLN: 5.0000 mg | Freq: Three times a day (TID) | INTRAMUSCULAR | Status: DC | PRN

## 2016-08-06 MED ORDER — SODIUM CHLORIDE 0.9 % IV SOLN
INTRAVENOUS | Status: AC
  Administered 2016-08-06 (×2): via INTRAVENOUS

## 2016-08-06 MED ORDER — SODIUM CHLORIDE 0.9 % IV SOLN
Freq: Once | INTRAVENOUS | Status: AC
  Administered 2016-08-06: 16:00:00 via INTRAVENOUS

## 2016-08-06 MED ORDER — METOCLOPRAMIDE HCL 5 MG PO TABS
5.0000 mg | ORAL_TABLET | Freq: Three times a day (TID) | ORAL | Status: DC | PRN
Start: 2016-08-06 — End: 2016-08-10

## 2016-08-06 MED ORDER — MENTHOL 3 MG MT LOZG: 1.0000 | LOZENGE | OROMUCOSAL | Status: DC | PRN

## 2016-08-06 MED ORDER — ASPIRIN EC 325 MG PO TBEC
325.0000 mg | DELAYED_RELEASE_TABLET | Freq: Every day | ORAL | Status: DC
  Administered 2016-08-06 – 2016-08-10 (×5): 325 mg via ORAL
  Filled 2016-08-06 (×5): qty 1

## 2016-08-06 MED ORDER — OXYCODONE HCL 5 MG PO TABS
5.0000 mg | ORAL_TABLET | ORAL | Status: DC | PRN
  Administered 2016-08-06: 10 mg via ORAL
  Filled 2016-08-06: qty 2

## 2016-08-06 MED ORDER — HYDROCODONE-ACETAMINOPHEN 5-325 MG PO TABS: 1.0000 | ORAL_TABLET | Freq: Four times a day (QID) | ORAL | Status: DC | PRN

## 2016-08-06 MED ORDER — FUROSEMIDE 10 MG/ML IJ SOLN
20.0000 mg | Freq: Once | INTRAMUSCULAR | Status: AC
  Administered 2016-08-06: 20 mg via INTRAVENOUS
  Filled 2016-08-06: qty 2

## 2016-08-06 NOTE — Care Management Note (Signed)
Case Management Note  Patient Details  Name: [name] Spruce MRN: [id number] Date of Birth: 06/30/1941  Subjective/Objective:                    Action/Plan:   Expected Discharge Date:  08/06/16               Expected Discharge Plan:  Skilled Nursing Facility  In-House Referral:  Clinical Social Work  Discharge planning Services     Post Acute Care Choice:    Choice offered to:     DME Arranged:    DME Agency:     HH Arranged:    HH Agency:     Status of Service:  Completed, signed off  If discussed at [employer] Meetings, dates discussed:    Additional Comments:  Kingsley PlanWile, Asiah Browder Marie, RN 08/06/2016, 12:55 PM

## 2016-08-06 NOTE — Progress Notes (Addendum)
CRITICAL VALUE ALERT  Critical value received:  Hgb 6.9  Date of notification:  08/06/16  Time of notification:  0830  Critical value read back: yes Hgb 6.9 Nurse who received alert:  Alferd ApaJoan Dimitriy Carreras  MD notified (1st page):  Dr. Rito EhrlichKrishnan, G.  Time of first page: 0835  MD notified (2nd page):na  Time of second page:na  Responding MD:  Dr. Barnie DelG. Krishnan  Time MD responded:  [phone]

## 2016-08-06 NOTE — Anesthesia Postprocedure Evaluation (Addendum)
Anesthesia Post Note  Patient: Lisa [name]  Procedure(s) Performed: Procedure(s) (LRB): EXTERNAL FIXATION FEMUR, ORIF FEMUR (Left) CYSTOSCOPY WITH RETROGRADE PYELOGRAM/URETERAL STENT PLACEMENT (Left)  Patient location during evaluation: PACU Anesthesia Type: General Level of consciousness: awake and awake and alert Pain management: pain level controlled Vital Signs Assessment: post-procedure vital signs reviewed and stable Respiratory status: spontaneous breathing, nonlabored ventilation and respiratory function stable Cardiovascular status: blood pressure returned to baseline Anesthetic complications: no       Last Vitals:  Vitals:   08/05/16 2345 08/06/16 0000  BP: (!) 151/76 (!) 142/70  Pulse:    Resp: 13 14  Temp:  36.5 C    Last Pain:  Vitals:   08/05/16 1542  TempSrc: Oral  PainSc:                  Annisten Manchester COKER

## 2016-08-06 NOTE — Clinical Social Work Note (Signed)
Clinical Social Work Assessment  Patient Details  Name: Lisa [name] MRN: [id number] Date of Birth: 01/02/1941  Date of referral:  08/06/16               Reason for consult:  Facility Placement                Permission sought to share information with:  Family Supports Permission granted to share information::  Yes, Release of Information Signed  Name::     [name] Mitchell  Agency::     Relationship::  Daughter  Contact Information:  [phone]  Housing/Transportation Living arrangements for the past 2 months:  Apartment Source of Information:  Adult Children Patient Interpreter Needed:  None Criminal Activity/Legal Involvement Pertinent to Current Situation/Hospitalization:  No - Comment as needed Significant Relationships:  Adult Children Lives with:  Self Do you feel safe going back to the place where you live?  No (Patient is in need of a higher level of care) Need for family participation in patient care:  Yes (Comment)  Care giving concerns:  Patient is in need of a higher level of care.    Social Worker assessment / plan:  CSW met with pt and daughter to address consult for new SNF. CSW introduced herself and explained role of social work. Pt was sleeping soundly during most of the assessment. P/T is recommending STR at SNF. CSW explained discharging to SNF with a managed Medicare. Pt's daughter expressed interest in [facility] as pt was just discharged from facility on 07/27/16. CSW explained that pt has a limited number of Medicare days and will contact [facility] to verify Medicare days remaining. Pt has Medicaid and may need long term care at facility. Per pt's daughter, [name], she plans to take pt back to [place] where daughter lives and is currently working with an assisted living on placement. Pt's daughter would like pt to build her strength to make the trip to [place].   CSW spoke with admission director at [facility] and sent referral . Facility will verify  Medicare says available and CSW will follow up on potential bed offer. CSW will continue to follow.   Employment status:  Retired Insurance information:  Medicaid In State, Managed Medicare PT Recommendations:  Skilled Nursing Facility Information / Referral to community resources:  Skilled Nursing Facility  Patient/Family's Response to care:  Pt's daughter was appreciative of CSW support.   Patient/Family's Understanding of and Emotional Response to Diagnosis, Current Treatment, and Prognosis:  Pt's daughter understand that pt will need to build strength prior to moving to [place].   Emotional Assessment Appearance:  Appears stated age Attitude/Demeanor/Rapport:  Other (appropriate) Affect (typically observed):  Accepting Orientation:  Oriented to Self, Oriented to Situation, Oriented to  Time, Oriented to Place Alcohol / Substance use:  Not Applicable Psych involvement (Current and /or in the community):  No (Comment)  Discharge Needs  Concerns to be addressed:  Adjustment to Illness, Discharge Planning Concerns Readmission within the last 30 days:  No Current discharge risk:  Chronically ill Barriers to Discharge:  Continued Medical Work up    G , LCSW 08/06/2016, 3:30 PM  

## 2016-08-06 NOTE — Progress Notes (Signed)
Progress Note  Patient Name: Lisa [name] Date of Encounter: 08/06/2016  Primary Cardiologist: Dr. Mayford Knife  Subjective   Denies chest pain and SOB.   Inpatient Medications    Scheduled Meds: . sodium chloride   Intravenous Once  . amLODipine  5 mg Oral Daily  . aspirin EC  325 mg Oral Q breakfast  . atorvastatin  10 mg Oral q1800  . budesonide (PULMICORT) nebulizer solution  1 mg Nebulization BID  . buPROPion  150 mg Oral Daily  . docusate sodium  100 mg Oral BID  . feeding supplement  1 Container Oral TID BM  . furosemide  20 mg Intravenous Once  . gabapentin  300 mg Oral QHS  . hydrALAZINE  25 mg Oral Q8H  . Influenza vac split quadrivalent PF  0.5 mL Intramuscular Tomorrow-1000  . isosorbide mononitrate  30 mg Oral Daily  . metoprolol succinate  50 mg Oral Daily  . pantoprazole  40 mg Oral Daily  . sodium chloride flush  3 mL Intravenous Q12H   Continuous Infusions: . sodium chloride 75 mL/hr at 08/06/16 0253  . sodium chloride 50 mL/hr at 08/05/16 1840   PRN Meds: acetaminophen **OR** acetaminophen, bisacodyl, HYDROcodone-acetaminophen, HYDROmorphone (DILAUDID) injection, ipratropium-albuterol, menthol-cetylpyridinium **OR** phenol, metoCLOPramide **OR** metoCLOPramide (REGLAN) injection, morphine injection, morphine injection, ondansetron **OR** ondansetron (ZOFRAN) IV, oxyCODONE, polyethylene glycol   Vital Signs    Vitals:   08/06/16 0114 08/06/16 0120 08/06/16 0254 08/06/16 0431  BP:  100/80 (!) 107/56 (!) 144/60  Pulse:  90 91 95  Resp:  19 16 16   Temp:  97.4 F (36.3 C)  98 F (36.7 C)  TempSrc:  Axillary  Oral  SpO2: 100% 100% 100% 100%  Weight:      Height:        Intake/Output Summary (Last 24 hours) at 08/06/16 0911 Last data filed at 08/06/16 0600  Gross per 24 hour  Intake          1233.75 ml  Output             1250 ml  Net           -16.25 ml   Filed Weights   08/02/16 1854 08/04/16 0433 08/05/16 0428  Weight: (!) 320 lb (145.2  kg) 227 lb 9.6 oz (103.2 kg) 228 lb 11.2 oz (103.7 kg)    Telemetry     Afib, RBBB - Personally Reviewed  ECG    Afib, RBBB, LAFB - Personally Reviewed  Physical Exam   GEN: No acute distress.   Neck: hard to assess JVD Cardiac: IRR, No murmurs, rubs, or gallops.  Respiratory: Clear to auscultation bilaterally. GI: Soft, nontender, non-distended  MS: chronic BLE edema; No deformity. S/p right LE surgery.  Neuro:  Nonfocal  Psych: Normal  affect   Labs    Chemistry Recent Labs Lab 08/02/16 2144  08/03/16 0412  08/03/16 1147 08/04/16 0457 08/04/16 1404 08/05/16 0516  NA 135  --  136  --   --  134*  --  133*  K 5.5*  < > 5.7*  < > 5.3* 5.3*  --  4.8  CL 111  --  112*  --   --  112*  --  109  CO2 14*  --  17*  --   --  16*  --  14*  GLUCOSE 160*  --  160*  --   --  101*  --  92  BUN 73*  --  69*  --   --  66*  --  64*  CREATININE 3.11*  --  2.94*  --   --  2.72*  --  2.75*  CALCIUM 8.5*  --  8.3*  --   --  8.0*  --  7.9*  PROT 7.1  --  6.6  --   --  6.5  --   --   ALBUMIN 1.8*  --  1.6*  --   --  1.7*  --   --   AST 58*  --  56*  --   --  53*  --   --   ALT 39  --  40  --   --  35  --   --   ALKPHOS 181*  --  181*  --   --  152*  --   --   BILITOT 2.4*  --  2.7*  --   --  2.6* 2.4*  --   GFRNONAA 13*  --  14*  --   --  16*  --  16*  GFRAA 16*  --  17*  --   --  18*  --  18*  ANIONGAP 10  --  7  --   --  6  --  10  < > = values in this interval not displayed.   Hematology Recent Labs Lab 08/04/16 0457 08/05/16 0516 08/06/16 0803  WBC 11.2* 11.7* 14.3*  RBC 3.01* 2.82* 2.29*  HGB 8.7* 8.2* 6.9*  HCT 27.1* 25.7* 21.3*  MCV 90.0 91.1 93.0  MCH 28.9 29.1 30.1  MCHC 32.1 31.9 32.4  RDW 17.1* 17.8* 18.5*  PLT 85* 100* 113*    Cardiac EnzymesNo results for input(s): TROPONINI in the last 168 hours. No results for input(s): TROPIPOC in the last 168 hours.   BNP Recent Labs Lab 08/02/16 2352  BNP 500.3*     DDimer No results for input(s): DDIMER in the  last 168 hours.   Radiology    Dg Knee 1-2 Views Left  Result Date: 08/05/2016 CLINICAL DATA:  Internal fixation of left femoral fracture. Initial encounter. EXAM: LEFT KNEE - 1-2 VIEW COMPARISON:  Left femur radiographs performed 08/02/2016 FINDINGS: Seven fluoroscopic C-arm images are provided from the OR, demonstrating placement of a plate and screws across the mid to distal femur, transfixing the comminuted distal femoral fracture with mild residual medial displacement and anterior angulation. No new fractures are seen. The patient's total knee arthroplasty is grossly unremarkable in appearance. IMPRESSION: Status post internal fixation of distal left femur, with mild residual medial displacement and anterior angulation. Electronically Signed   By: Roanna RaiderJeffery  Chang M.D.   On: 08/05/2016 23:58   Dg C-arm 61-120 Min  Result Date: 08/05/2016 CLINICAL DATA:  Internal fixation of left femoral fracture. Initial encounter. EXAM: LEFT KNEE - 1-2 VIEW COMPARISON:  Left femur radiographs performed 08/02/2016 FINDINGS: Seven fluoroscopic C-arm images are provided from the OR, demonstrating placement of a plate and screws across the mid to distal femur, transfixing the comminuted distal femoral fracture with mild residual medial displacement and anterior angulation. No new fractures are seen. The patient's total knee arthroplasty is grossly unremarkable in appearance. IMPRESSION: Status post internal fixation of distal left femur, with mild residual medial displacement and anterior angulation. Electronically Signed   By: Roanna RaiderJeffery  Chang M.D.   On: 08/05/2016 23:58   Ct Renal Stone Study  Result Date: 08/04/2016 CLINICAL DATA:  Hydronephrosis. EXAM: CT ABDOMEN AND PELVIS WITHOUT CONTRAST TECHNIQUE: Multidetector CT imaging of the abdomen and pelvis  was performed following the standard protocol without IV contrast. COMPARISON:  Abdominal ultrasound 08/03/2016, CT of the abdomen pelvis 05/05/2016 FINDINGS: Lower  chest: No acute abnormality. Hepatobiliary: No focal liver abnormality is seen. Status post cholecystectomy. No biliary dilatation. Pancreas: Unremarkable. No pancreatic ductal dilatation or surrounding inflammatory changes. Spleen: Normal in size without focal abnormality. Adrenals/Urinary Tract: Bilateral adrenal glands are normal. Mild right renal cortical thinning. Two right renal cysts, the larger of which in the lower pole is partially exophytic and measures 4.6 cm. There is a marked left hydronephrosis without evidence of left hydroureter. No obstructive calculi es seen. Reactive perirenal and periureteral fat stranding is seen. Stomach/Bowel: Stomach is within normal limits. No evidence of appendicitis. No evidence of bowel wall thickening, distention, or inflammatory changes. Vascular/Lymphatic: Aortic atherosclerosis. No enlarged abdominal or pelvic lymph nodes. Reproductive: Status post hysterectomy. No adnexal masses. Other: Small fat containing periumbilical hernia.  Mild anasarca. Musculoskeletal: Lumbosacral spine laminectomy and fusion noted. S shaped thoraco lumbar spine scoliosis with advanced osteoarthritic changes. No evidence of fracture or suspicious osseous lesions. IMPRESSION: Marked left hydronephrosis without evidence of left hydroureter. No obstructive nephrolithiasis is seen. Given these findings obstructive mass at the left UPJ becomes of primarily consideration. The lack of IV contrast precludes proper evaluation of the renal parenchyma and left ureter. Electronically Signed   By: Ted Mcalpine M.D.   On: 08/04/2016 10:37    Cardiac Studies   Echo 05/05/16 Study Conclusions  - Left ventricle: The cavity size was normal. Wall thickness was   increased in a pattern of moderate LVH. Systolic function was   normal. The estimated ejection fraction was in the range of 50%   to 55%. Wall motion was normal; there were no regional wall   motion abnormalities. Doppler  parameters are consistent with   abnormal left ventricular relaxation (grade 1 diastolic   dysfunction). - Mitral valve: Mildly calcified annulus. - Left atrium: The atrium was moderately dilated. - Tricuspid valve: There was mild-moderate regurgitation. - Pulmonary arteries: Systolic pressure was moderately increased.   PA peak pressure: 53 mm Hg (S).   Patient Profile     76 y.o. female morbidly obese AA female, SNF resident,with a history of CAD-s/p CABG in 2005, DM, CRI-3, HTN, OSA, CAF,-on Eliquis, dyslipidemia,andchronic diastolic CHF. Presented on 08/02/16 with mechanical fall resulting in a left femur and tibial fracture. She underwent ORIF on 08/05/16.   Assessment & Plan    1. S/p left ORIF external fixation femur, ORIF  2. Cystoscopy with retrograde with stent placement  3.  History of chronic atrial fib: Rate controlled. Continue metoprolol.   This patients CHA2DS2-VASc Score and unadjusted Ischemic Stroke Rate (% per year) is equal to 7.2 % stroke rate/year from a score of 5 Above score calculated as 1 point each if present [CHF, HTN, DM, Vascular=MI/PAD/Aortic Plaque, Age if 65-74, or Female], 2 points each if present [Age > 75, or Stroke/TIA/TE]   On Eliquis at home, restart when ok with Ortho.   4. History of CAD s/p CABG in 2005: Continue ASA, will need to reduce to 81 mg when Eliquis restarted.   5. Chronic diastolic CHF: Getting IV fluid at 51ml/hr. Would be careful with hydration. Also on IV Lasix.   6. Acute on chronic renal disease stage III: Creatinine is 2.71, appears to be around her baseline.   Signed, Little Ishikawa, NP  08/06/2016, 9:11 AM    I have seen, examined and evaluated the patient this AM along  with Suzzette Righter, NP.  After reviewing all the available data and chart, we discussed the patients laboratory, study & physical findings as well as symptoms in detail. I agree with her findings, examination as well as impression recommendations as per our  discussion.      Overall remains stable from a CV standpoint - continue current Rx & re-initiate Eliquis per Ortho. Cardiology will sign off.  Pls call with ?s.Bryan Lemma, M.D., M.S. Interventional Cardiologist   Pager # [phone] Phone # [phone] [address] [place], [place] [address]

## 2016-08-06 NOTE — Clinical Social Work Placement (Signed)
   CLINICAL SOCIAL WORK PLACEMENT  NOTE  Date:  08/06/2016  Patient Details  Name: Lisa [name] MRN: [id number] Date of Birth: April 22, 1941  Clinical Social Work is seeking post-discharge placement for this patient at the Skilled  Nursing Facility level of care (*CSW will initial, date and re-position this form in  chart as items are completed):  Yes   Patient/family provided with [facility] Clinical Social Work Department's list of facilities offering this level of care within the geographic area requested by the patient (or if unable, by the patient's family).  Yes   Patient/family informed of their freedom to choose among providers that offer the needed level of care, that participate in Medicare, Medicaid or managed care program needed by the patient, have an available bed and are willing to accept the patient.  Yes   Patient/family informed of [facility]'s ownership interest in [facility] and [facility], as well as of the fact that they are under no obligation to receive care at these facilities.  PASRR submitted to EDS on       PASRR number received on       Existing PASRR number confirmed on 08/06/16     FL2 transmitted to all facilities in geographic area requested by pt/family on 08/06/16     FL2 transmitted to all facilities within larger geographic area on       Patient informed that his/her managed care company has contracts with or will negotiate with certain facilities, including the following:            Patient/family informed of bed offers received.  Patient chooses bed at       Physician recommends and patient chooses bed at      Patient to be transferred to   on  .  Patient to be transferred to facility by       Patient family notified on   of transfer.  Name of family member notified:        PHYSICIAN       Additional Comment:    _______________________________________________ Dede QuerySarah Janeshia Ciliberto, LCSW 08/06/2016, 3:04 PM

## 2016-08-06 NOTE — Progress Notes (Signed)
Received a call from blood blank that they tried to find blood crossmatch but still incompatible, for emergency situation, Dr. Jimmy PicketJohn Patrick lab medical director can be contacted. Dr. Barnie DelG. Krishnan was made aware.

## 2016-08-06 NOTE — Progress Notes (Signed)
Patient stated she did not want to wear CPAP tonight but may try it tomorrow night. Will continue to monitor.

## 2016-08-06 NOTE — [place] FL2 (Signed)
[place] MEDICAID FL2 LEVEL OF CARE SCREENING TOOL     IDENTIFICATION  Patient Name: Lisa [name] Birthdate: March 22, 1941 Sex: female Admission Date (Current Location): 08/02/2016  [place] Number:  Haynes Bast [id number] M Facility and Address:  [facility] [place], [address], [place], [place] [address]      Provider Number: [id number]  Attending Physician Name and Address:  Osvaldo Shipper, MD  Relative Name and Phone Number:       Current Level of Care: Hospital Recommended Level of Care: Skilled Nursing Facility Prior Approval Number:    Date Approved/Denied:   PASRR Number: [id number]  Discharge Plan: SNF    Current Diagnoses: Patient Active Problem List   Diagnosis Date Noted  . Hypertensive heart disease   . CAD (coronary artery disease) 08/02/2016  . Femur fracture, left (HCC) 08/02/2016  . Fibula fracture 08/02/2016  . Coagulopathy (HCC) 08/02/2016  . DNR (do not resuscitate) discussion   . Chronic anticoagulation 06/07/2016  . Anemia, normocytic normochromic 06/07/2016  . Acute on chronic renal insufficiency 06/07/2016  . Chronic atrial fibrillation (HCC)   . Pressure sore 06/06/2016  . Chronic respiratory failure with hypoxia (HCC) 06/03/2016  . PVC (premature ventricular contraction) 03/22/2014  . Chronic diastolic heart failure (HCC) 05/06/2013  . OSA (obstructive sleep apnea) 05/06/2013  . Morbid obesity (HCC)   . Hyperlipidemia   . GERD (gastroesophageal reflux disease) 10/27/2010  . Gout 10/27/2010  . Hypertension 10/27/2010  . Asthma 10/27/2010  . Diabetes mellitus (HCC) 10/27/2010    Orientation RESPIRATION BLADDER Height & Weight     Self, Time, Situation, Place  O2 (2L/min) Continent Weight: 228 lb 11.2 oz (103.7 kg) Height:  5\' 4"  (162.6 cm)  BEHAVIORAL SYMPTOMS/MOOD NEUROLOGICAL BOWEL NUTRITION STATUS      Continent Diet (Soft)  AMBULATORY STATUS COMMUNICATION OF NEEDS Skin   Extensive Assist  Verbally PU Stage and Appropriate Care, Surgical wounds (Stage II -  Partial thickness loss of dermis presenting as a shallow open ulcer with a red, pink wound bed without slough.)   PU Stage 2 Dressing:  (Foam dressing)                   Personal Care Assistance Level of Assistance  Bathing, Feeding, Dressing Bathing Assistance: Maximum assistance Feeding assistance: Limited assistance Dressing Assistance: Maximum assistance     Functional Limitations Info  Sight, Hearing, Speech Sight Info: Adequate Hearing Info: Adequate Speech Info: Adequate    SPECIAL CARE FACTORS FREQUENCY  PT (By licensed PT), OT (By licensed OT)     PT Frequency: 5x OT Frequency: 5x            Contractures Contractures Info: Not present    Additional Factors Info  Allergies, Code Status Code Status Info: Full Allergies Info: No known allergies           Current Medications (08/06/2016):  This is the current hospital active medication list Current Facility-Administered Medications  Medication Dose Route Frequency Provider Last Rate Last Dose  . 0.45 % sodium chloride infusion   Intravenous Continuous Eldred Manges, MD 75 mL/hr at 08/06/16 0253    . 0.9 %  sodium chloride infusion   Intravenous Once Osvaldo Shipper, MD      . acetaminophen (TYLENOL) tablet 650 mg  650 mg Oral Q6H PRN Eldred Manges, MD       Or  . acetaminophen (TYLENOL) suppository 650 mg  650 mg Rectal Q6H PRN Eldred Manges,  MD      . amLODipine (NORVASC) tablet 5 mg  5 mg Oral Daily Briscoe Deutscherimothy S Opyd, MD   5 mg at 08/06/16 [id number]  . aspirin EC tablet 325 mg  325 mg Oral Q breakfast Eldred MangesMark C Yates, MD   325 mg at 08/06/16 0858  . atorvastatin (LIPITOR) tablet 10 mg  10 mg Oral q1800 Briscoe Deutscherimothy S Opyd, MD   10 mg at 08/04/16 1740  . bisacodyl (DULCOLAX) EC tablet 5 mg  5 mg Oral Daily PRN Lavone Neriimothy S Opyd, MD      . budesonide (PULMICORT) nebulizer solution 1 mg  1 mg Nebulization BID Briscoe Deutscherimothy S Opyd, MD   1 mg at 08/06/16 0919  .  buPROPion (WELLBUTRIN XL) 24 hr tablet 150 mg  150 mg Oral Daily Briscoe Deutscherimothy S Opyd, MD   150 mg at 08/06/16 0952  . docusate sodium (COLACE) capsule 100 mg  100 mg Oral BID Briscoe Deutscherimothy S Opyd, MD   100 mg at 08/06/16 [id number]  . feeding supplement (BOOST / RESOURCE BREEZE) liquid 1 Container  1 Container Oral TID BM Osvaldo ShipperGokul Krishnan, MD   1 Container at 08/06/16 [phone]  . gabapentin (NEURONTIN) capsule 300 mg  300 mg Oral QHS Osvaldo ShipperGokul Krishnan, MD      . hydrALAZINE (APRESOLINE) tablet 25 mg  25 mg Oral Q8H Briscoe Deutscherimothy S Opyd, MD   25 mg at 08/05/16 0646  . HYDROcodone-acetaminophen (NORCO/VICODIN) 5-325 MG per tablet 1-2 tablet  1-2 tablet Oral Q6H PRN Eldred MangesMark C Yates, MD      . HYDROmorphone (DILAUDID) injection 0.5 mg  0.5 mg Intravenous Q2H PRN Eldred MangesMark C Yates, MD      . Influenza vac split quadrivalent PF (FLUARIX) injection 0.5 mL  0.5 mL Intramuscular Tomorrow-1000 Osvaldo ShipperGokul Krishnan, MD      . ipratropium-albuterol (DUONEB) 0.5-2.5 (3) MG/3ML nebulizer solution 3 mL  3 mL Nebulization Q6H PRN Briscoe Deutscherimothy S Opyd, MD      . isosorbide mononitrate (IMDUR) 24 hr tablet 30 mg  30 mg Oral Daily Briscoe Deutscherimothy S Opyd, MD   30 mg at 08/06/16 [id number]  . menthol-cetylpyridinium (CEPACOL) lozenge 3 mg  1 lozenge Oral PRN Eldred MangesMark C Yates, MD       Or  . phenol (CHLORASEPTIC) mouth spray 1 spray  1 spray Mouth/Throat PRN Eldred MangesMark C Yates, MD      . metoCLOPramide (REGLAN) tablet 5-10 mg  5-10 mg Oral Q8H PRN Eldred MangesMark C Yates, MD       Or  . metoCLOPramide (REGLAN) injection 5-10 mg  5-10 mg Intravenous Q8H PRN Eldred MangesMark C Yates, MD      . metoprolol succinate (TOPROL-XL) 24 hr tablet 50 mg  50 mg Oral Daily Briscoe Deutscherimothy S Opyd, MD   50 mg at 08/06/16 [id number]  . morphine 2 MG/ML injection 0.5 mg  0.5 mg Intravenous Q2H PRN Eldred MangesMark C Yates, MD      . morphine 4 MG/ML injection 1-3 mg  1-3 mg Intravenous Q3H PRN Briscoe Deutscherimothy S Opyd, MD   2 mg at 08/05/16 1414  . ondansetron (ZOFRAN) tablet 4 mg  4 mg Oral Q6H PRN Eldred MangesMark C Yates, MD       Or  . ondansetron [facility](ZOFRAN) injection 4 mg  4  mg Intravenous Q6H PRN Eldred MangesMark C Yates, MD      . oxyCODONE (Oxy IR/ROXICODONE) immediate release tablet 5-10 mg  5-10 mg Oral Q4H PRN Eldred MangesMark C Yates, MD   10 mg at 08/06/16 [id number]  . pantoprazole (PROTONIX) EC  tablet 40 mg  40 mg Oral Daily Briscoe Deutscher, MD   40 mg at 08/06/16 6962  . polyethylene glycol (MIRALAX / GLYCOLAX) packet 17 g  17 g Oral Daily PRN Lavone Neri Opyd, MD      . sodium chloride flush (NS) 0.9 % injection 3 mL  3 mL Intravenous Q12H Briscoe Deutscher, MD   3 mL at 08/06/16 9528     Discharge Medications: Please see discharge summary for a list of discharge medications.  Relevant Imaging Results:  Relevant Lab Results:   Additional Information SSN: [id number]  Dominic Pea, LCSW

## 2016-08-06 NOTE — Progress Notes (Signed)
Orthopedic Tech Progress Note Patient Details:  Lisa [name] 1941/07/02 [id number]  Ortho Devices Type of Ortho Device: Other (comment) Ortho Device/Splint Location: Because of pt's age Over Head fram will not be given. (ortho tech). Ortho Device/Splint Interventions: Ordered, Application   Alvina ChouWilliams, Aleeta Schmaltz C 08/06/2016, 2:17 PM

## 2016-08-06 NOTE — Anesthesia Preprocedure Evaluation (Signed)
Anesthesia Evaluation  Patient identified by MRN, date of birth, ID band Patient awake    Reviewed: Allergy & Precautions, NPO status , Patient's Chart, lab work & pertinent test results  Airway Mallampati: II  TM Distance: >3 FB     Dental   Pulmonary former smoker,     + decreased breath sounds      Cardiovascular hypertension,  Rhythm:Regular Rate:Normal     Neuro/Psych    GI/Hepatic   Endo/Other  diabetes  Renal/GU      Musculoskeletal   Abdominal   Peds  Hematology   Anesthesia Other Findings   Reproductive/Obstetrics                             Anesthesia Physical Anesthesia Plan  ASA: III  Anesthesia Plan: General   Post-op Pain Management:    Induction: Intravenous  Airway Management Planned: Oral ETT  Additional Equipment:   Intra-op Plan:   Post-operative Plan: Extubation in OR  Informed Consent: I have reviewed the patients History and Physical, chart, labs and discussed the procedure including the risks, benefits and alternatives for the proposed anesthesia with the patient or authorized representative who has indicated his/her understanding and acceptance.     Plan Discussed with: CRNA and Anesthesiologist  Anesthesia Plan Comments:         Anesthesia Quick Evaluation

## 2016-08-06 NOTE — Progress Notes (Signed)
Subjective: Doing well.  Pain controlled.    Objective: Vital signs in last 24 hours: Temp:  [97.3 F (36.3 C)-98.1 F (36.7 C)] 98.1 F (36.7 C) (01/30 1550) Pulse Rate:  [90-100] 95 (01/30 0431) Resp:  [13-19] 16 (01/30 0431) BP: (100-151)/(38-101) 144/60 (01/30 0431) SpO2:  [100 %] 100 % (01/30 0919) FiO2 (%):  [0 %] 0 % (01/30 0114)  Intake/Output from previous day: 01/29 0701 - 01/30 0700 In: 1473.8 [P.O.:240; I.V.:1233.8] Out: 1250 [Urine:1050; Blood:200] Intake/Output this shift: Total I/O In: -  Out: 50 [Urine:50]   Recent Labs  08/04/16 0457 08/05/16 0516 08/06/16 0803  HGB 8.7* 8.2* 6.9*    Recent Labs  08/05/16 0516 08/06/16 0803  WBC 11.7* 14.3*  RBC 2.82* 2.29*  HCT 25.7* 21.3*  PLT 100* 113*    Recent Labs  08/05/16 0516 08/06/16 0803  NA 133* 137  K 4.8 4.9  CL 109 111  CO2 14* 18*  BUN 64* 62*  CREATININE 2.75* 2.71*  GLUCOSE 92 88  CALCIUM 7.9* 7.9*    Recent Labs  08/05/16 0516 08/06/16 0536  INR 1.42 1.46    Exam:  Pleasant BF in NAD.  Slight bleeding through dressing.    Assessment/Plan: Plan for transfer to SNF when medically stable.  Hgb 6.9 today.  Medicine team ordered transfusion PRBC's. Dressing change tomorrow.    Lisa [name] 08/06/2016, 4:10 PM

## 2016-08-06 NOTE — Progress Notes (Signed)
[facility] PROGRESS NOTE  Lisa [name] A [name] WUJ:[id number] DOB: 10/16/1940 DOA: 08/02/2016  PCP: [name],[name], MD  Brief History/Interval Summary: 76 year old female with medical history significant for atrial fibrillation on Eliquis, hypertension, CAD status post CABG, COPD with chronic respiratory failure, OSA, and chronic diastolic CHF who presents to the emergency department from her nursing facility for evaluation of severe left leg pain following a fall. Patient was evaluated in the emergency department. Found to have a left supracondylar fracture of the femur along with an oblique fracture involving the distal diaphysis of the left fibula. Patient was also noted to be anemic. Hospitalized for further management. Patient was seen by orthopedics. Patient is high risk for perioperative mortality. She was seen by cardiology. Patient also found to have left-sided hydronephrosis for which patient was seen by urology. Patient underwent surgical intervention.  Reason for Visit: Left Supracondylar femur fracture  Consultants: Orthopedics. Cardiology. Urology  Procedures:  1/29: External fixator application, ORIF lateral Biomet femoral plating, removal of external fixator. (9 hole anatomic lateral femoral plate.  1/29: Cystoscopy, left retrograde ureteropyelogram, fluoroscopic interpretation, placement of 6 French by 24 centimeter contour double-J stent without tether   Antibiotics: None  Subjective/Interval History: Patient feels well this morning. Denies any pain when laying still.   ROS: Denies nausea or vomiting.   Objective:  Vital Signs  Vitals:   08/06/16 0120 08/06/16 0254 08/06/16 0431 08/06/16 0919  BP: 100/80 (!) 107/56 (!) 144/60   Pulse: 90 91 95   Resp: 19 16 16    Temp: 97.4 F (36.3 C)  98 F (36.7 C)   TempSrc: Axillary  Oral   SpO2: 100% 100% 100% 100%  Weight:      Height:        Intake/Output Summary (Last 24 hours) at 08/06/16 1018 Last data  filed at 08/06/16 0600  Gross per 24 hour  Intake          1233.75 ml  Output             1250 ml  Net           -16.25 ml   Filed Weights   08/02/16 1854 08/04/16 0433 08/05/16 0428  Weight: (!) 145.2 kg (320 lb) 103.2 kg (227 lb 9.6 oz) 103.7 kg (228 lb 11.2 oz)    General appearance: alert, cooperative, appears stated age and no distress Resp: clear to auscultation bilaterally Cardio: regular rate and rhythm, S1, S2 normal, no murmur, click, rub or gallop GI: soft, non-tender; bowel sounds normal; no masses,  no organomegaly. Foley catheter is noted. Extremities: Left lower extremity in a Dressing Neurologic: Alert and oriented X 3, no focal neurological deficits.  Lab Results:  Data Reviewed: I have personally reviewed following labs and imaging studies  CBC:  Recent Labs Lab 08/02/16 2144 08/03/16 0555 08/04/16 0457 08/05/16 0516 08/06/16 0803  WBC 13.1* 12.9* 11.2* 11.7* 14.3*  NEUTROABS 11.0* 9.8*  --   --   --   HGB 7.2* 6.3* 8.7* 8.2* 6.9*  HCT 23.0* 20.0* 27.1* 25.7* 21.3*  MCV 93.1 93.0 90.0 91.1 93.0  PLT 138* 121* 85* 100* 113*    Basic Metabolic Panel:  Recent Labs Lab 08/02/16 2144  08/03/16 0412 08/03/16 0744 08/03/16 1147 08/04/16 0457 08/05/16 0516 08/06/16 0803  NA 135  --  136  --   --  134* 133* 137  K 5.5*  < > 5.7* 5.6* 5.3* 5.3* 4.8 4.9  CL 111  --  112*  --   --  112* 109 111  CO2 14*  --  17*  --   --  16* 14* 18*  GLUCOSE 160*  --  160*  --   --  101* 92 88  BUN 73*  --  69*  --   --  66* 64* 62*  CREATININE 3.11*  --  2.94*  --   --  2.72* 2.75* 2.71*  CALCIUM 8.5*  --  8.3*  --   --  8.0* 7.9* 7.9*  < > = values in this interval not displayed.  GFR: Estimated Creatinine Clearance: 20.4 mL/min (by C-G formula based on SCr of 2.71 mg/dL (H)).  Liver Function Tests:  Recent Labs Lab 08/02/16 2144 08/03/16 0412 08/04/16 0457 08/04/16 1404 08/06/16 0803  AST 58* 56* 53*  --  36  ALT 39 40 35  --  20  ALKPHOS 181* 181*  152*  --  108  BILITOT 2.4* 2.7* 2.6* 2.4* 1.9*  PROT 7.1 6.6 6.5  --  5.8*  ALBUMIN 1.8* 1.6* 1.7*  --  1.5*    Coagulation Profile:  Recent Labs Lab 08/02/16 2144 08/03/16 0412 08/04/16 0457 08/05/16 0516 08/06/16 0536  INR 2.00 1.99 1.75 1.42 1.46    CBG:  Recent Labs Lab 08/05/16 1913 08/05/16 2318 08/05/16 2343 08/06/16 0023 08/06/16 0813  GLUCAP 131* 60* 106* 99 86    Anemia Panel: No results for input(s): VITAMINB12, FOLATE, FERRITIN, TIBC, IRON, RETICCTPCT in the last 72 hours.  Recent Results (from the past 240 hour(s))  MRSA PCR Screening     Status: None   Collection Time: 08/03/16  4:43 AM  Result Value Ref Range Status   MRSA by PCR NEGATIVE NEGATIVE Final    Comment:        The GeneXpert MRSA Assay (FDA approved for NASAL specimens only), is one component of a comprehensive MRSA colonization surveillance program. It is not intended to diagnose MRSA infection nor to guide or monitor treatment for MRSA infections.       Radiology Studies: Dg Knee 1-2 Views Left  Result Date: 08/05/2016 CLINICAL DATA:  Internal fixation of left femoral fracture. Initial encounter. EXAM: LEFT KNEE - 1-2 VIEW COMPARISON:  Left femur radiographs performed 08/02/2016 FINDINGS: Seven fluoroscopic C-arm images are provided from the OR, demonstrating placement of a plate and screws across the mid to distal femur, transfixing the comminuted distal femoral fracture with mild residual medial displacement and anterior angulation. No new fractures are seen. The patient's total knee arthroplasty is grossly unremarkable in appearance. IMPRESSION: Status post internal fixation of distal left femur, with mild residual medial displacement and anterior angulation. Electronically Signed   By: Roanna Raider M.D.   On: 08/05/2016 23:58   Dg C-arm 61-120 Min  Result Date: 08/05/2016 CLINICAL DATA:  Internal fixation of left femoral fracture. Initial encounter. EXAM: LEFT KNEE - 1-2  VIEW COMPARISON:  Left femur radiographs performed 08/02/2016 FINDINGS: Seven fluoroscopic C-arm images are provided from the OR, demonstrating placement of a plate and screws across the mid to distal femur, transfixing the comminuted distal femoral fracture with mild residual medial displacement and anterior angulation. No new fractures are seen. The patient's total knee arthroplasty is grossly unremarkable in appearance. IMPRESSION: Status post internal fixation of distal left femur, with mild residual medial displacement and anterior angulation. Electronically Signed   By: Roanna Raider M.D.   On: 08/05/2016 23:58   Ct Renal Stone Study  Result Date: 08/04/2016 CLINICAL DATA:  Hydronephrosis. EXAM: CT ABDOMEN AND  PELVIS WITHOUT CONTRAST TECHNIQUE: Multidetector CT imaging of the abdomen and pelvis was performed following the standard protocol without IV contrast. COMPARISON:  Abdominal ultrasound 08/03/2016, CT of the abdomen pelvis 05/05/2016 FINDINGS: Lower chest: No acute abnormality. Hepatobiliary: No focal liver abnormality is seen. Status post cholecystectomy. No biliary dilatation. Pancreas: Unremarkable. No pancreatic ductal dilatation or surrounding inflammatory changes. Spleen: Normal in size without focal abnormality. Adrenals/Urinary Tract: Bilateral adrenal glands are normal. Mild right renal cortical thinning. Two right renal cysts, the larger of which in the lower pole is partially exophytic and measures 4.6 cm. There is a marked left hydronephrosis without evidence of left hydroureter. No obstructive calculi es seen. Reactive perirenal and periureteral fat stranding is seen. Stomach/Bowel: Stomach is within normal limits. No evidence of appendicitis. No evidence of bowel wall thickening, distention, or inflammatory changes. Vascular/Lymphatic: Aortic atherosclerosis. No enlarged abdominal or pelvic lymph nodes. Reproductive: Status post hysterectomy. No adnexal masses. Other: Small fat  containing periumbilical hernia.  Mild anasarca. Musculoskeletal: Lumbosacral spine laminectomy and fusion noted. S shaped thoraco lumbar spine scoliosis with advanced osteoarthritic changes. No evidence of fracture or suspicious osseous lesions. IMPRESSION: Marked left hydronephrosis without evidence of left hydroureter. No obstructive nephrolithiasis is seen. Given these findings obstructive mass at the left UPJ becomes of primarily consideration. The lack of IV contrast precludes proper evaluation of the renal parenchyma and left ureter. Electronically Signed   By: Ted Mcalpine M.D.   On: 08/04/2016 10:37     Medications:  Scheduled: . sodium chloride   Intravenous Once  . amLODipine  5 mg Oral Daily  . aspirin EC  325 mg Oral Q breakfast  . atorvastatin  10 mg Oral q1800  . budesonide (PULMICORT) nebulizer solution  1 mg Nebulization BID  . buPROPion  150 mg Oral Daily  . docusate sodium  100 mg Oral BID  . feeding supplement  1 Container Oral TID BM  . gabapentin  300 mg Oral QHS  . hydrALAZINE  25 mg Oral Q8H  . Influenza vac split quadrivalent PF  0.5 mL Intramuscular Tomorrow-1000  . isosorbide mononitrate  30 mg Oral Daily  . metoprolol succinate  50 mg Oral Daily  . pantoprazole  40 mg Oral Daily  . sodium chloride flush  3 mL Intravenous Q12H   Continuous: . sodium chloride 75 mL/hr at 08/06/16 0253  . sodium chloride 50 mL/hr at 08/05/16 1840   NWG:[id number] **OR** acetaminophen, bisacodyl, HYDROcodone-acetaminophen, HYDROmorphone (DILAUDID) injection, ipratropium-albuterol, menthol-cetylpyridinium **OR** phenol, metoCLOPramide **OR** metoCLOPramide (REGLAN) injection, morphine injection, morphine injection, ondansetron **OR** ondansetron (ZOFRAN) IV, oxyCODONE, polyethylene glycol  Assessment/Plan:  Principal Problem:   Femur fracture, left (HCC) Active Problems:   GERD (gastroesophageal reflux disease)   Hypertension   Chronic diastolic heart failure  (HCC)   OSA (obstructive sleep apnea)   Chronic respiratory failure with hypoxia (HCC)   Pressure sore   Anemia, normocytic normochromic   CAD (coronary artery disease)   Fibula fracture   Coagulopathy (HCC)   Hypertensive heart disease    Acute left femur fracture, left fibula fracture  This was as a result of a mechanical fall. Orthopedic surgery has seen the patient. Anticoagulation was discontinued for surgery. Due to patient's complicated cardiac history, cardiology was consulted to provide a preoperative assessment. Patient is high-risk for perioperative mortality, with risk of perioperative cardiac complications 3-4% per Amie Critchley.. Patient is status post ORIF. He is to be stable postoperatively.  AKI superimposed on CKD stage III with hyperkalemia/left-sided hydronephrosis Patient's baseline  appears to be between 1.5 and 2.0. Creatinine was 3.1 on admission. Patient appeared to be intravascularly depleted. She was given IV fluids. Renal function appears to have plateaued. Evaluation has revealed left-sided hydronephrosis with concern for obstruction at UPJ. This is likely contributing to persistent elevation in creatinine. Urology has seen the patient and underwent stent placement. Urine output is present but low. Continue to monitor for now. If there is no improvement despite stent placement may need to involve nephrology. No evidence for volume overload at this time. Continue gentle hydration. Potassium has improved.  Normocytic anemia/thrombocytopenia  Hgb is 7.2 on admission; recent priors have been in 7-9 range. Hemoglobin did drop to 6.7 on 1/27. She was transfused 2 units of blood. Hemoglobin improved. Postoperatively, it has dropped again. She'll be transfused additional units of blood.  Paroxysmal atrial fibrillation Appears to be in atrial fibrillation. CHADS-VASc at least 58 (age x2, gender, CHF, HTN). She is anticoagulated with Eliquis; this was held on admission in  preparation for surgery. Continue metoprolol as tolerated. Resume anticoagulation when okay with orthopedics.  Chronic diastolic CHF  Patient appeared to be intravascularly depleted on admission. Gentle IV hydration. TTE (05/05/16) with EF 50-55%, moderate LVH, grade 1 diastolic dysfunction, moderate LAE, mild-mod TR, and moderate increase in PA pressures. Lasix held on admission in light of AKI and apparent dehydration. Strict ins and outs and daily weights.  CAD  No anginal complaints on admission. Pt is s/p CABG and follows with cardiology in outpatient setting. Patient was supposed to have stress test earlier this month, but she tells me that she did not undergo the same. The reason for ordering this test was not clear. Cardiology has seen the patient. No testing ordered at this time.  Hyperbilirubinemia, coagulopathy Reason for hyperbilirubinemia is not entirely clear. Bilirubin was fractionated and appears to be equal, direct and indirect. Ultrasound suggests fatty infiltration of the liver. Alkaline phosphatase is also noted to be mildly elevated. CBD is nondistended. Coagulopathy could have been due to anticoagulation.  COPD, OSA, chronic respiratory failure  Patient uses 2 Lpm supplemental oxygen at baseline. Respiratory status appears to be stable on admission. Continue supplemental O2, CPAP qHS.  Essential Hypertension Continue Toprol, Norvasc, and hydralazine as tolerated   DVT Prophylaxis: Anticoagulation was on hold for surgery. Resume when okay with orthopedics Code Status: Full code  Family Communication: Discussed with patient  Disposition Plan: Management as outlined above.     LOS: 4 days   [facility]  [facility] Pager [phone] 08/06/2016, 10:18 AM  If 7PM-7AM, please contact night-coverage at [email or web address], password [facility]

## 2016-08-06 NOTE — Progress Notes (Signed)
Called Janine LimboCheryl Barnes to update her on her mother and let her know her mother was back in her room.  No answer.

## 2016-08-06 NOTE — Progress Notes (Signed)
Informed Alison StallingKatherine Mitchell that her mother was back in her room after surgery and that she is in stable condition. I told her that I was unable to [facility]. Ms. Clovis RileyMitchell said that she would inform Ms. Zachery DauerBarnes about her mother's ccondition.

## 2016-08-06 NOTE — Evaluation (Addendum)
Physical Therapy Evaluation Patient Details Name: Lisa [name] MRN: [id number] DOB: 05/05/1941 Today's Date: 08/06/2016   History of Present Illness  76 yo female s/p external fixator removal and L ORIF lateral Biomet femoral plating   Clinical Impression  Pt admitted with above diagnosis. Pt currently with functional limitations due to the deficits listed below (see PT Problem List). Pt needed max assist to total assist to sit EOB for 5 minutes. Will need SNf for therapy prior to d/c home.   Pt will benefit from skilled PT to increase their independence and safety with mobility to allow discharge to the venue listed below.      Follow Up Recommendations SNF;Supervision/Assistance - 24 hour    Equipment Recommendations  None recommended by PT    Recommendations for Other Services       Precautions / Restrictions Precautions Precautions: Fall Required Braces or Orthoses: Knee Immobilizer - Left Knee Immobilizer - Left: On at all times Restrictions Weight Bearing Restrictions: Yes LLE Weight Bearing: Partial weight bearing LLE Partial Weight Bearing Percentage or Pounds: 50%      Mobility  Bed Mobility Overal bed mobility: Needs Assistance Bed Mobility: Supine to Sit;Sit to Supine     Supine to sit: Max assist;+2 for physical assistance;+2 for safety/equipment Sit to supine: Max assist;+2 for safety/equipment;+2 for physical assistance   General bed mobility comments: pt reaching with bil UE for bed rail and then to help support trunk once EOB. Pt however required total (A) support of L LE and pad used to helicopter pivot to EOB.   Transfers                 General transfer comment: not assessed at this time. pt unable to maintain EOB static sitting and will be total +2 for attempt  Ambulation/Gait                Stairs            Wheelchair Mobility    Modified Rankin (Stroke Patients Only)       Balance Overall balance assessment: Needs  assistance Sitting-balance support: Bilateral upper extremity supported;Feet supported Sitting balance-Leahy Scale: Poor Sitting balance - Comments: Pt unable to sit EOB without constanct support of therapy staff.  did progress to min assit for sitting for brief periods but overall mod to max assist to sit EOB. Sat 5 min  total Postural control: Posterior lean                                   Pertinent Vitals/Pain Pain Assessment: Faces Faces Pain Scale: Hurts little more Pain Location: L LE Pain Descriptors / Indicators: Grimacing Pain Intervention(s): Limited activity within patient's tolerance;Monitored during session;Premedicated before session;Repositioned    Home Living Family/patient expects to be discharged to:: Private residence Living Arrangements: Children Available Help at Discharge: Available 24 hours/day;Family Type of Home: Apartment Home Access: Stairs to enter Entrance Stairs-Rails: Right;Left;Can reach both Entrance Stairs-Number of Steps: 5 Home Layout: One level Home Equipment: Walker - 4 wheels;Shower seat;Wheelchair - manual Additional Comments: Difficult to assess as pt appears confused.  Alot of information was taken from prior chart. reports daughter and son in law home all the time to help her . recently d/c from [facility].     Prior Function Level of Independence: Needs assistance   Gait / Transfers Assistance Needed: Pt stated she had been doing wheelchair transfers  with assist PTA and this is how she slid out of chair withfamily trying to assist her to it.      Comments: pt reports she did not walk and that she was not using a RW . Pt reports x2 family members were helping her transfer and she fell. Uncertain of PTA mobility. Chart does state recent d/c from SNF     Hand Dominance   Dominant Hand: Right    Extremity/Trunk Assessment   Upper Extremity Assessment Upper Extremity Assessment: Defer to OT evaluation RUE  Deficits / Details: hx of shoulder surg. pt noticeable elevation of scapula and shoulder in static sitting.    Lower Extremity Assessment Lower Extremity Assessment: LLE deficits/detail;RLE deficits/detail RLE Deficits / Details: grossly 2/5 LLE Deficits / Details: KI required LLE: Unable to fully assess due to pain;Unable to fully assess due to immobilization    Cervical / Trunk Assessment Cervical / Trunk Assessment: Kyphotic  Communication   Communication: No difficulties  Cognition Arousal/Alertness: Awake/alert Behavior During Therapy: WFL for tasks assessed/performed Overall Cognitive Status: Impaired/Different from baseline Area of Impairment: Orientation Orientation Level: Time             General Comments: pt reports correct location and surg. Pt reports month December and when provided cue reports November. pt delayed response to questions.     General Comments General comments (skin integrity, edema, etc.): ice applied to left knee once positioned well in bed.   O2 at rest 86% with O2 not in place on arrival.  Turned O2 on 2L and sats 94%.     Exercises     Assessment/Plan    PT Assessment Patient needs continued PT services  PT Problem List Decreased activity tolerance;Decreased balance;Decreased mobility;Decreased strength;Decreased range of motion;Decreased knowledge of use of DME;Decreased safety awareness;Decreased knowledge of precautions;Pain          PT Treatment Interventions DME instruction;Gait training;Functional mobility training;Therapeutic activities;Therapeutic exercise;Balance training;Patient/family education;Wheelchair mobility training    PT Goals (Current goals can be found in the Care Plan section)  Acute Rehab PT Goals Patient Stated Goal: none stated at this time PT Goal Formulation: With patient Time For Goal Achievement: 08/20/16 Potential to Achieve Goals: Fair    Frequency Min 3X/week   Barriers to discharge Decreased  caregiver support      Co-evaluation PT/OT/SLP Co-Evaluation/Treatment: Yes Reason for Co-Treatment: Complexity of the patient's impairments (multi-system involvement) PT goals addressed during session: Mobility/safety with mobility OT goals addressed during session: Proper use of Adaptive equipment and DME;ADL's and self-care       End of Session Equipment Utilized During Treatment: Gait belt;Oxygen Activity Tolerance: Patient limited by fatigue;Patient limited by pain Patient left: in bed;with call bell/phone within reach;with SCD's reapplied Nurse Communication: Mobility status;Need for lift equipment         Time: [id number] PT Time Calculation (min) (ACUTE ONLY): 17 min   Charges:   PT Evaluation $PT Eval Moderate Complexity: 1 Procedure     PT G Codes:        Gustava Berland F Zyair Russi 08/06/2016, 12:17 PM

## 2016-08-06 NOTE — Evaluation (Signed)
Occupational Therapy Evaluation Patient Details Name: Lisa [name] MRN: [id number] DOB: 16-Nov-1940 Today's Date: 08/06/2016    History of Present Illness 76 yo female s/p external fixator removal and L ORIF lateral Biomet femoral plating   Of note in the chart : pt recently d/c from [facility] Past Medical History:  Diagnosis Date  . Chronic diastolic CHF (congestive heart failure) (HCC)   . Coronary artery disease    s/p CABG  . Diabetes mellitus without complication (HCC)   . Hyperlipidemia   . Hypertension   . Morbid obesity (HCC)   . OSA (obstructive sleep apnea)    mild OSA with AHI 7.15 now on CPAP at 16cm H2O  . RBBB       Clinical Impression   Patient is s/p L ORIF femoral plating surgery resulting in functional limitations due to the deficits listed below (see OT problem list). PTA was at home with family but uncertain of (A) required due to patient incon Patient will benefit from skilled OT acutely to increase independence and safety with ADLS to allow discharge SNF.Pt currently total dependence for all adls at bed level      Follow Up Recommendations  SNF;Supervision/Assistance - 24 hour    Equipment Recommendations  3 in 1 bedside commode;Wheelchair (measurements OT);Wheelchair cushion (measurements OT);Hospital bed    Recommendations for Other Services       Precautions / Restrictions Precautions Precautions: Fall Required Braces or Orthoses: Knee Immobilizer - Left Knee Immobilizer - Left: On at all times Restrictions Weight Bearing Restrictions: Yes LLE Weight Bearing: Partial weight bearing      Mobility Bed Mobility Overal bed mobility: Needs Assistance Bed Mobility: Supine to Sit;Sit to Supine     Supine to sit: Max assist;+2 for physical assistance;+2 for safety/equipment Sit to supine: Max assist;+2 for safety/equipment;+2 for physical assistance   General bed mobility comments: pt reaching with bil UE for bed rail and  then to help support trunk once EOB. Pt however required total (A) support of L LE and pad used to helicopter pivot to EOB.   Transfers                 General transfer comment: not assessed at this time. pt unable to maintain EOB static sitting and will be total +2 for attempt    Balance Overall balance assessment: Needs assistance Sitting-balance support: Bilateral upper extremity supported;Feet supported Sitting balance-Leahy Scale: Poor                                      ADL Overall ADL's : Needs assistance/impaired         Upper Body Bathing: Total assistance   Lower Body Bathing: Total assistance   Upper Body Dressing : Total assistance   Lower Body Dressing: Total assistance                 General ADL Comments: Pt with hbg 6.9 so eob transfer only at this time. pt with oxygen doff on arrival. Pt with 2 L oxygen applied [place] during session. Pt requesting return to supine due to discomfort at EOB.      Vision     Perception     Praxis      Pertinent Vitals/Pain Pain Assessment: Faces Faces Pain Scale: Hurts little more Pain Location: L LE Pain Descriptors / Indicators: Grimacing Pain Intervention(s): Monitored during session;Premedicated before session;Repositioned  Hand Dominance Right   Extremity/Trunk Assessment Upper Extremity Assessment Upper Extremity Assessment: RUE deficits/detail RUE Deficits / Details: hx of shoulder surg. pt noticeable elevation of scapula and shoulder in static sitting.   Lower Extremity Assessment Lower Extremity Assessment: Defer to PT evaluation;LLE deficits/detail LLE Deficits / Details: KI required   Cervical / Trunk Assessment Cervical / Trunk Assessment: Kyphotic   Communication Communication Communication: No difficulties   Cognition Arousal/Alertness: Awake/alert Behavior During Therapy: WFL for tasks assessed/performed Overall Cognitive Status: Impaired/Different from  baseline Area of Impairment: Orientation Orientation Level: Time             General Comments: pt reports correct location and surg. Pt reports month December and when provided cue reports November. pt delayed response to questions.    General Comments       Exercises       Shoulder Instructions      Home Living Family/patient expects to be discharged to:: Private residence Living Arrangements: Children Available Help at Discharge: Available 24 hours/day;Family Type of Home: Apartment                       Home Equipment: Walker - 4 wheels;Shower seat;Wheelchair - manual   Additional Comments: reports daughter and son in law home all the time to help her . recently d/c from [facility].       Prior Functioning/Environment Level of Independence: Needs assistance        Comments: pt reports she did not walk and that she was not using a RW . Pt reports x2 family members were helping her transfer and she fell. Uncertain of PTA mobility. Chart does state recent d/c from SNF        OT Problem List: Decreased strength;Decreased range of motion;Decreased activity tolerance;Impaired balance (sitting and/or standing);Decreased cognition;Decreased safety awareness;Decreased knowledge of use of DME or AE;Decreased knowledge of precautions;Cardiopulmonary status limiting activity;Obesity;Impaired UE functional use;Pain;Increased edema   OT Treatment/Interventions: Self-care/ADL training;Therapeutic exercise;Neuromuscular education;DME and/or AE instruction;Therapeutic activities;Cognitive remediation/compensation;Patient/family education;Balance training    OT Goals(Current goals can be found in the care plan section) Acute Rehab OT Goals Patient Stated Goal: none stated at this time OT Goal Formulation: With patient Time For Goal Achievement: 08/20/16 Potential to Achieve Goals: Good  OT Frequency: Min 2X/week   Barriers to D/C: Decreased caregiver  support  reports grandson is home and family works. But at the beginning of session reports daughter and son in law (A)       Co-evaluation PT/OT/SLP Co-Evaluation/Treatment: Yes Reason for Co-Treatment: Complexity of the patient's impairments (multi-system involvement);Necessary to address cognition/behavior during functional activity;For patient/therapist safety;To address functional/ADL transfers   OT goals addressed during session: Proper use of Adaptive equipment and DME;ADL's and self-care      End of Session Equipment Utilized During Treatment: Oxygen Nurse Communication: Mobility status;Need for lift equipment;Precautions;Weight bearing status  Activity Tolerance: Patient limited by pain Patient left: in bed;with call bell/phone within reach;with bed alarm set;with SCD's reapplied   Time: [id number] OT Time Calculation (min): 23 min Charges:  OT General Charges $OT Visit: 1 Procedure OT Evaluation $OT Eval Moderate Complexity: 1 Procedure G-Codes:    Lisa [name], Lisa [name] 08/06/2016, 11:36 AM   Lisa [name], Lisa [name]   OTR/L Pager: [phone] Office: [phone] .

## 2016-08-06 NOTE — Progress Notes (Signed)
Patient was asleep woke to do breathing treatment, asked if she wanted to wear CPAP tonight. Patient stated no. No CPAP in room from previous night.

## 2016-08-06 NOTE — Consult Note (Addendum)
   [facility]   08/06/2016  Lisa [name] 07/08/1941 [id number]    [facility]. Lisa [name] [facility]. Went to bedside to speak with her. Discussed recommendation for going back to SNF post discharge. Ms. Lisa [name] was [facility] to home with home health and was readmitted to hospital after sustaining a fall at home. Please see detailed patient outreach notes [facility] L[facility] under chart review tab then notes section.  Made Ms. Kyer aware that [facility] will follow up post discharge. She is agreeable to this. Made inpatient RNCM aware that [facility] is active.    AddendumMicah Flesher: Went to bedside again and spoke with patient's daughter, Lisa [name]. She endorses that Ms. Lisa [name] was [facility] and discharged home on 07/27/16 and came back to hospital on 08/02/16 after a fall at home. Santina EvansCatherine states the plan is to go back to SNF at discharge. She states she would rather for her mother to [facility]. Ms. Lisa [name] is in agreement with this. Will touch base with inpatient LCSW about discussion with daughter, Santina EvansCatherine. Of note, Lisa [name]'s number is [phone].     Raiford NobleAtika Robyn Galati, MSN-Ed, RN,BSN [facility] Care [facility] Hospital Liaison [phone]

## 2016-08-06 NOTE — Care Management Important Message (Signed)
Important Message  Patient Details  Name: Lisa [name] MRN: [id number] Date of Birth: 15-Apr-1941   Medicare Important Message Given:  Yes    Dorena BodoIris Matas Burrows 08/06/2016, 12:18 PM

## 2016-08-07 DIAGNOSIS — I10 Essential (primary) hypertension: Secondary | ICD-10-CM

## 2016-08-07 DIAGNOSIS — D649 Anemia, unspecified: Secondary | ICD-10-CM

## 2016-08-07 DIAGNOSIS — D689 Coagulation defect, unspecified: Secondary | ICD-10-CM

## 2016-08-07 DIAGNOSIS — G4733 Obstructive sleep apnea (adult) (pediatric): Secondary | ICD-10-CM

## 2016-08-07 DIAGNOSIS — I2510 Atherosclerotic heart disease of native coronary artery without angina pectoris: Secondary | ICD-10-CM

## 2016-08-07 DIAGNOSIS — S89302A Unspecified physeal fracture of lower end of left fibula, initial encounter for closed fracture: Secondary | ICD-10-CM

## 2016-08-07 DIAGNOSIS — I480 Paroxysmal atrial fibrillation: Secondary | ICD-10-CM

## 2016-08-07 DIAGNOSIS — J9611 Chronic respiratory failure with hypoxia: Secondary | ICD-10-CM

## 2016-08-07 LAB — BLOOD GAS, ARTERIAL
Acid-base deficit: 9.6 mmol/L — ABNORMAL HIGH (ref 0.0–2.0)
Bicarbonate: 15.9 mmol/L — ABNORMAL LOW (ref 20.0–28.0)
Drawn by: 36527
O2 Content: 2 L/min
O2 SAT: 98.5 %
PCO2 ART: 35.3 mmHg (ref 32.0–48.0)
PH ART: 7.277 — AB (ref 7.350–7.450)
Patient temperature: 98.8
pO2, Arterial: 122 mmHg — ABNORMAL HIGH (ref 83.0–108.0)

## 2016-08-07 LAB — CBC
HCT: 24.4 % — ABNORMAL LOW (ref 36.0–46.0)
HEMOGLOBIN: 7.9 g/dL — AB (ref 12.0–15.0)
MCH: 29 pg (ref 26.0–34.0)
MCHC: 32.4 g/dL (ref 30.0–36.0)
MCV: 89.7 fL (ref 78.0–100.0)
Platelets: 82 10*3/uL — ABNORMAL LOW (ref 150–400)
RBC: 2.72 MIL/uL — AB (ref 3.87–5.11)
RDW: 18.1 % — ABNORMAL HIGH (ref 11.5–15.5)
WBC: 14.3 10*3/uL — ABNORMAL HIGH (ref 4.0–10.5)

## 2016-08-07 LAB — BASIC METABOLIC PANEL
Anion gap: 6 (ref 5–15)
BUN: 60 mg/dL — ABNORMAL HIGH (ref 6–20)
CHLORIDE: 110 mmol/L (ref 101–111)
CO2: 16 mmol/L — AB (ref 22–32)
Calcium: 7.7 mg/dL — ABNORMAL LOW (ref 8.9–10.3)
Creatinine, Ser: 2.64 mg/dL — ABNORMAL HIGH (ref 0.44–1.00)
GFR calc Af Amer: 19 mL/min — ABNORMAL LOW (ref >60)
GFR calc non Af Amer: 16 mL/min — ABNORMAL LOW (ref >60)
GLUCOSE: 158 mg/dL — AB (ref 65–99)
POTASSIUM: 4.2 mmol/L (ref 3.5–5.1)
Sodium: 132 mmol/L — ABNORMAL LOW (ref 135–145)

## 2016-08-07 LAB — GLUCOSE, CAPILLARY
GLUCOSE-CAPILLARY: 108 mg/dL — AB (ref 65–99)
Glucose-Capillary: 214 mg/dL — ABNORMAL HIGH (ref 65–99)

## 2016-08-07 NOTE — Progress Notes (Addendum)
2 Days Post-Op Subjective: Patient reports she is well. No specific complaints.   Objective: Vital signs in last 24 hours: Temp:  [98.1 F (36.7 C)-98.8 F (37.1 C)] 98.8 F (37.1 C) (01/31 0518) Pulse Rate:  [93-107] 107 (01/31 0518) Resp:  [16-18] 18 (01/31 0518) BP: (95-113)/(52-76) 105/52 (01/31 0518) SpO2:  [92 %-100 %] 100 % (01/31 0518)  Intake/Output from previous day: 01/30 0701 - 01/31 0700 In: 2367.5 [P.O.:840; I.V.:857.5; Blood:670] Out: 1525 [Urine:1525] Intake/Output this shift: Total I/O In: 0  Out: 250 [Urine:250]  Physical Exam:  Sleepy. NAD Resp - reg effort depth. abd - soft, NT GU - urine light pink in tubing, some cloudiness.   Lab Results:  Recent Labs  08/05/16 0516 08/06/16 0803 08/07/16 0451  HGB 8.2* 6.9* 7.9*  HCT 25.7* 21.3* 24.4*   BMET  Recent Labs  08/06/16 0803 08/07/16 0451  NA 137 132*  K 4.9 4.2  CL 111 110  CO2 18* 16*  GLUCOSE 88 158*  BUN 62* 60*  CREATININE 2.71* 2.64*  CALCIUM 7.9* 7.7*    Recent Labs  08/05/16 0516 08/06/16 0536  INR 1.42 1.46   No results for input(s): LABURIN in the last 72 hours. Results for orders placed or performed during the hospital encounter of 08/02/16  MRSA PCR Screening     Status: None   Collection Time: 08/03/16  4:43 AM  Result Value Ref Range Status   MRSA by PCR NEGATIVE NEGATIVE Final    Comment:        The GeneXpert MRSA Assay (FDA approved for NASAL specimens only), is one component of a comprehensive MRSA colonization surveillance program. It is not intended to diagnose MRSA infection nor to guide or monitor treatment for MRSA infections.   Urine culture     Status: None (Preliminary result)   Collection Time: 08/05/16 10:44 PM  Result Value Ref Range Status   Specimen Description CYSTOSCOPY  Final   Special Requests PT ON ANCEF  Final   Culture CULTURE REINCUBATED FOR BETTER GROWTH  Final   Report Status PENDING  Incomplete    Studies/Results: Dg  Knee 1-2 Views Left  Result Date: 08/05/2016 CLINICAL DATA:  Internal fixation of left femoral fracture. Initial encounter. EXAM: LEFT KNEE - 1-2 VIEW COMPARISON:  Left femur radiographs performed 08/02/2016 FINDINGS: Seven fluoroscopic C-arm images are provided from the OR, demonstrating placement of a plate and screws across the mid to distal femur, transfixing the comminuted distal femoral fracture with mild residual medial displacement and anterior angulation. No new fractures are seen. The patient's total knee arthroplasty is grossly unremarkable in appearance. IMPRESSION: Status post internal fixation of distal left femur, with mild residual medial displacement and anterior angulation. Electronically Signed   By: Roanna Raider M.D.   On: 08/05/2016 23:58   Dg C-arm 61-120 Min  Result Date: 08/05/2016 CLINICAL DATA:  Internal fixation of left femoral fracture. Initial encounter. EXAM: LEFT KNEE - 1-2 VIEW COMPARISON:  Left femur radiographs performed 08/02/2016 FINDINGS: Seven fluoroscopic C-arm images are provided from the OR, demonstrating placement of a plate and screws across the mid to distal femur, transfixing the comminuted distal femoral fracture with mild residual medial displacement and anterior angulation. No new fractures are seen. The patient's total knee arthroplasty is grossly unremarkable in appearance. IMPRESSION: Status post internal fixation of distal left femur, with mild residual medial displacement and anterior angulation. Electronically Signed   By: Roanna Raider M.D.   On: 08/05/2016 23:58  Assessment/Plan:  left hydro - s/p left stent - she'll need ureteroscopy in next 6-8 weeks once she recovers. Urine Cx from OR pending. She does not clinically appear to be infected - no fever, VSS.   AKI - Cr slowly improving, UOP adequate. Would keep foley until she's more ambulatory.    LOS: 5 days   Shenandoah Yeats 08/07/2016, 12:36 PM

## 2016-08-07 NOTE — Progress Notes (Signed)
   Subjective: 2 Days Post-Op Procedure(s) (LRB): EXTERNAL FIXATION FEMUR, ORIF FEMUR (Left) CYSTOSCOPY WITH RETROGRADE PYELOGRAM/URETERAL STENT PLACEMENT (Left) Patient reports pain as moderate.    Objective: Vital signs in last 24 hours: Temp:  [98.1 F (36.7 C)-98.8 F (37.1 C)] 98.8 F (37.1 C) (01/31 0518) Pulse Rate:  [93-107] 107 (01/31 0518) Resp:  [16-18] 18 (01/31 0518) BP: (95-113)/(52-76) 105/52 (01/31 0518) SpO2:  [92 %-100 %] 100 % (01/31 0518)  Intake/Output from previous day: 01/30 0701 - 01/31 0700 In: 2367.5 [P.O.:840; I.V.:857.5; Blood:670] Out: 1525 [Urine:1525] Intake/Output this shift: No intake/output data recorded.   Recent Labs  08/05/16 0516 08/06/16 0803 08/07/16 0451  HGB 8.2* 6.9* 7.9*    Recent Labs  08/06/16 0803 08/07/16 0451  WBC 14.3* 14.3*  RBC 2.29* 2.72*  HCT 21.3* 24.4*  PLT 113* 82*    Recent Labs  08/06/16 0803 08/07/16 0451  NA 137 132*  K 4.9 4.2  CL 111 110  CO2 18* 16*  BUN 62* 60*  CREATININE 2.71* 2.64*  GLUCOSE 88 158*  CALCIUM 7.9* 7.7*    Recent Labs  08/05/16 0516 08/06/16 0536  INR 1.42 1.46    Neurologically intact No results found.  Assessment/Plan: 2 Days Post-Op Procedure(s) (LRB): EXTERNAL FIXATION FEMUR, ORIF FEMUR (Left) CYSTOSCOPY WITH RETROGRADE PYELOGRAM/URETERAL STENT PLACEMENT (Left) Up with therapy to recliner  Eldred MangesMark C Trevone Prestwood 08/07/2016, 7:31 AM

## 2016-08-07 NOTE — Progress Notes (Signed)
Eddie CandleAngelo, RN, paged this NP earlier because pt was lethargic. Apparently, RRRN happened to be on the unit and went to see pt. ABG was ordered which was pretty normal. Pt satting normally. Pt was counseled about wearing her CPAP by RRRN and RT. Pt refusing to wear it.  KJKG, NP [facility]

## 2016-08-07 NOTE — Progress Notes (Signed)
Pt is lethargic, but is easily aroused and Ax3. Rapid Response/ Leslie, RN was on the unit making rounds when asked to take a look at pt. ABG's where order pH=7.288, pCO2=34.1, pO2=115, [place]=15.9. Pt is in room Ax3 and resting. Will continue to monitor pt.

## 2016-08-07 NOTE — Progress Notes (Signed)
Patient refusing CPAP again tonight. RN aware. SPO2 100%.

## 2016-08-07 NOTE — Progress Notes (Addendum)
PROGRESS NOTE    Lisa [name]  ZOX:[id number] DOB: [birth year] DOA: 08/02/2016 PCP: Laurena Slimmer, MD   Brief Narrative:  76 year old femalewith medical history significant foratrial fibrillation on Eliquis, hypertension, CAD status post CABG, COPD with chronic respiratory failure, OSA, and chronic diastolic CHF who presents to the emergency department from her nursing facility for evaluation of severe left leg pain following a fall. Patient was evaluated in the emergency department. Found to have a left supracondylar fracture of the femur along with an oblique fracture involving the distal diaphysis of the left fibula. Patient was also noted to be anemic. Hospitalized for further management. Patient was seen by orthopedics. Patient is high risk for perioperative mortality. She was seen by cardiology. Patient also found to have left-sided hydronephrosis for which patient was seen by urology. Patient underwent surgical intervention and is POD 2 of External Fixation, ORIF of Femur and Cystoscopy with Retrograde Pyelogram/Ureteral Stent Placement.   Assessment & Plan:   Principal Problem:   Femur fracture, left (HCC) Active Problems:   GERD (gastroesophageal reflux disease)   Hypertension   Chronic diastolic heart failure (HCC)   OSA (obstructive sleep apnea)   Chronic respiratory failure with hypoxia (HCC)   Pressure sore   Anemia, normocytic normochromic   CAD (coronary artery disease)   Fibula fracture   Coagulopathy (HCC)   Hypertensive heart disease  Left femur fracture, left fibula fracture  -This was as a result of a mechanical fall.  -Orthopedic surgery has seen the patient. Anticoagulation was discontinued for surgery.  -Due to patient's complicated cardiac history, cardiology was consulted to provide a preoperative assessment.  -Patient is high-risk for perioperative mortality, with risk of perioperative cardiac complications 3-4% per Amie Critchley.. Patient is status post  ORIF.  -Per Orthopedics -Continue to Monitor -WBC elevated posteroperatively; Likely reactive will monitor for S/Sx of Infection -Repeat CBC in AM  AKI superimposed on CKD stage III with hyperkalemia/left-sided hydronephrosis -Patient's baseline appears to be between 1.5 and 2.0.  -Creatinine was 3.1 on admission.  -Patient appeared to be intravascularly depleted. She was given IV fluids. Renal function appears to have plateaued.  -Evaluation has revealed left-sided hydronephrosis with concern for obstruction at [facility]. This is likely contributing to persistent elevation in creatinine. Urology has seen the patient and underwent stent placement. Urine output is present but low. Continue to monitor for now. -Per Urology will need a Ureteroscopy in 6-8 weeks -Urine Cx from OR pending -If there is no improvement despite stent placement may need to involve nephrology. No evidence for volume overload at this time. Continue gentle hydration. Potassium has improved. -Urology Recommends keeping foley until she is more ambulatory -BUN/Cr slightly improved to 60/2.64 from 62/2.71 -Repeat CMP in AM  Lethargy/Encephalopathy, improving -RRT was called last night because she was more lethargic -ABG wnL and patient saturating Fine -Patient did not wear CPAP -Hold Sedating Medications; Possibly from Pain Control with Morphine and Oxycodone IR -Continue to Monitor  Normocytic anemia/Thrombocytopenia  -Hgb is 7.2 on admission; recent priors have been in 7-9 range.  -Hemoglobin did drop to 6.7 on 1/27. She was transfused 2 units of blood. Hemoglobin improved. -Postoperatively, it has dropped again. She was transfused an additional 1 unit of PRBC's because of Antibodies -Hb/Hct went from 6.9/21.3 -> 7.9/24.4 and Platelet Count went from 113 -> 82 -Repeat CBC with Diff in AM   Paroxysmal atrial fibrillation -Appears to be in atrial fibrillation.  -CHADS-VASc at least 42 (age x2, gender, CHF, HTN).  -She  was anticoagulated with Eliquis; this was held on admission in preparation for surgery. -Continue metoprolol as tolerated. Resume anticoagulation when okay with orthopedics.  Chronic diastolic CHF  -Patient appeared to be intravascularly depleted on admission.  -Gentle IV hydration.  -TTE (05/05/16) with EF 50-55%, moderate LVH, grade 1 diastolic dysfunction, moderate LAE, mild-mod TR, and moderate increase in PA pressures. -Lasix held on admission in light of AKI and apparent dehydration.  -Strict ins and outs and daily weights.  CAD -No anginal complaints on admission.  -Pt is s/p CABG and follows with cardiology in outpatient setting.  -Cardiology has seen the patient. No testing ordered at this time.  Hyperbilirubinemia, coagulopathy -Reason for hyperbilirubinemia is not entirely clear.  -Bilirubin was fractionated and appears to be equal, direct and indirect. Ultrasound suggests fatty infiltration of the liver.  -Alkaline phosphatase is also noted to be mildly elevated. CBD is nondistended. Coagulopathy could have been due to anticoagulation. -Repeat CMP in AM  COPD, OSA, chronic respiratory failure -Patient uses 2 Lpm supplemental oxygen at baseline.  -Respiratory status appears to be stable on admission. Continue supplemental O2, CPAP qHS.  Essential Hypertension -Continue Toprol, Norvasc, and hydralazine as tolerated   DVT prophylaxis: SCD's for now and will resume Anticoagulation when ok with Orthopedic Surgery Code Status: FULL CODE Family Communication: No Family at Bedside Disposition Plan: SNF when Stable  Consultants:   Urology  Cardiology  Orthopedic Surgery  Procedures: POD 2 of External Fixation, ORIF of Femur and Cystoscopy with Retrograde Pyelogram/Ureteral Stent Placement.    Antimicrobials: Anti-infectives    Start     Dose/Rate Route Frequency Ordered Stop   08/04/16 0600  ceFAZolin (ANCEF) 3 g in dextrose 5 % 50 mL IVPB  Status:   Discontinued     3 g 130 mL/hr over 30 Minutes Intravenous To [facility] 08/03/16 2315 08/05/16 2307     Subjective: Seen and examined and was somnolent but arousable and was oriented. Had a RRT last night and apparently did not wear her CPAP. No complaints and states she is in some pain.   Objective: Vitals:   08/06/16 2007 08/06/16 2211 08/07/16 0518 08/07/16 1304  BP:  (!) 104/58 (!) 105/52 (!) 112/58  Pulse:   (!) 107 89  Resp:   18 18  Temp:   98.8 F (37.1 C) 98.2 F (36.8 C)  TempSrc:   Oral Oral  SpO2: 98%  100% 100%  Weight:      Height:        Intake/Output Summary (Last 24 hours) at 08/07/16 1614 Last data filed at 08/07/16 1356  Gross per 24 hour  Intake           2007.5 ml  Output             1425 ml  Net            582.5 ml   Filed Weights   08/02/16 1854 08/04/16 0433 08/05/16 0428  Weight: (!) 145.2 kg (320 lb) 103.2 kg (227 lb 9.6 oz) 103.7 kg (228 lb 11.2 oz)   Examination: Physical Exam:  Constitutional: NAD and appears calm and comfortable; Appears sleepy but answers questions appropriately  Eyes: Lids and conjunctivae normal, sclerae anicteric  ENMT: External Ears, Nose appear normal. Grossly normal hearing.  Neck: Appears normal, supple, no cervical masses, normal ROM, no appreciable thyromegaly, no JVD Respiratory: Clear to auscultation bilaterally, no wheezing, rales, rhonchi or crackles. Normal respiratory effort and patient is not tachypenic.  Cardiovascular: RRR,  no murmurs / rubs / gallops. S1 and S2 auscultated.  Abdomen: Soft, non-tender, non-distended. No masses palpated. No appreciable hepatosplenomegaly. Bowel sounds positive x4.  GU: Deferred. Foley Catheter in place draining dark colored urine.  Musculoskeletal: No clubbing / cyanosis of digits/nails. Let Leg Braced and dressed Skin: No rashes, lesions, ulcers on limited skin evaluation. No induration; Warm and dry.  Neurologic: CN 2-12 grossly intact with no focal  deficits.Romberg sign cerebellar reflexes not assessed.  Psychiatric: Normal judgment and insight. Awake and oriented x 3. Normal mood and appropriate affect.   Data Reviewed: I have personally reviewed following labs and imaging studies  CBC:  Recent Labs Lab 08/02/16 2144 08/03/16 0555 08/04/16 0457 08/05/16 0516 08/06/16 0803 08/07/16 0451  WBC 13.1* 12.9* 11.2* 11.7* 14.3* 14.3*  NEUTROABS 11.0* 9.8*  --   --   --   --   HGB 7.2* 6.3* 8.7* 8.2* 6.9* 7.9*  HCT 23.0* 20.0* 27.1* 25.7* 21.3* 24.4*  MCV 93.1 93.0 90.0 91.1 93.0 89.7  PLT 138* 121* 85* 100* 113* 82*   Basic Metabolic Panel:  Recent Labs Lab 08/03/16 0412  08/03/16 1147 08/04/16 0457 08/05/16 0516 08/06/16 0803 08/07/16 0451  NA 136  --   --  134* 133* 137 132*  K 5.7*  < > 5.3* 5.3* 4.8 4.9 4.2  CL 112*  --   --  112* 109 111 110  CO2 17*  --   --  16* 14* 18* 16*  GLUCOSE 160*  --   --  101* 92 88 158*  BUN 69*  --   --  66* 64* 62* 60*  CREATININE 2.94*  --   --  2.72* 2.75* 2.71* 2.64*  CALCIUM 8.3*  --   --  8.0* 7.9* 7.9* 7.7*  < > = values in this interval not displayed. GFR: Estimated Creatinine Clearance: 20.9 mL/min (by C-G formula based on SCr of 2.64 mg/dL (H)). Liver Function Tests:  Recent Labs Lab 08/02/16 2144 08/03/16 0412 08/04/16 0457 08/04/16 1404 08/06/16 0803  AST 58* 56* 53*  --  36  ALT 39 40 35  --  20  ALKPHOS 181* 181* 152*  --  108  BILITOT 2.4* 2.7* 2.6* 2.4* 1.9*  PROT 7.1 6.6 6.5  --  5.8*  ALBUMIN 1.8* 1.6* 1.7*  --  1.5*   No results for input(s): LIPASE, AMYLASE in the last 168 hours. No results for input(s): AMMONIA in the last 168 hours. Coagulation Profile:  Recent Labs Lab 08/02/16 2144 08/03/16 0412 08/04/16 0457 08/05/16 0516 08/06/16 0536  INR 2.00 1.99 1.75 1.42 1.46   Cardiac Enzymes: No results for input(s): CKTOTAL, CKMB, CKMBINDEX, TROPONINI in the last 168 hours. BNP (last 3 results) No results for input(s): PROBNP in the last 8760  hours. HbA1C: No results for input(s): HGBA1C in the last 72 hours. CBG:  Recent Labs Lab 08/05/16 2343 08/06/16 0023 08/06/16 0813 08/06/16 2030 08/07/16 0801  GLUCAP 106* 99 86 170* 214*   Lipid Profile: No results for input(s): CHOL, HDL, LDLCALC, TRIG, CHOLHDL, LDLDIRECT in the last 72 hours. Thyroid Function Tests: No results for input(s): TSH, T4TOTAL, FREET4, T3FREE, THYROIDAB in the last 72 hours. Anemia Panel: No results for input(s): VITAMINB12, FOLATE, FERRITIN, TIBC, IRON, RETICCTPCT in the last 72 hours. Sepsis Labs: No results for input(s): PROCALCITON, LATICACIDVEN in the last 168 hours.  Recent Results (from the past 240 hour(s))  MRSA PCR Screening     Status: None   Collection Time:  08/03/16  4:43 AM  Result Value Ref Range Status   MRSA by PCR NEGATIVE NEGATIVE Final    Comment:        The GeneXpert MRSA Assay (FDA approved for NASAL specimens only), is one component of a comprehensive MRSA colonization surveillance program. It is not intended to diagnose MRSA infection nor to guide or monitor treatment for MRSA infections.   Urine culture     Status: None (Preliminary result)   Collection Time: 08/05/16 10:44 PM  Result Value Ref Range Status   Specimen Description CYSTOSCOPY  Final   Special Requests PT ON ANCEF  Final   Culture CULTURE REINCUBATED FOR BETTER GROWTH  Final   Report Status PENDING  Incomplete    Radiology Studies: Dg Knee 1-2 Views Left  Result Date: 08/05/2016 CLINICAL DATA:  Internal fixation of left femoral fracture. Initial encounter. EXAM: LEFT KNEE - 1-2 VIEW COMPARISON:  Left femur radiographs performed 08/02/2016 FINDINGS: Seven fluoroscopic C-arm images are provided from the OR, demonstrating placement of a plate and screws across the mid to distal femur, transfixing the comminuted distal femoral fracture with mild residual medial displacement and anterior angulation. No new fractures are seen. The patient's total knee  arthroplasty is grossly unremarkable in appearance. IMPRESSION: Status post internal fixation of distal left femur, with mild residual medial displacement and anterior angulation. Electronically Signed   By: Roanna Raider M.D.   On: 08/05/2016 23:58   Dg C-arm 61-120 Min  Result Date: 08/05/2016 CLINICAL DATA:  Internal fixation of left femoral fracture. Initial encounter. EXAM: LEFT KNEE - 1-2 VIEW COMPARISON:  Left femur radiographs performed 08/02/2016 FINDINGS: Seven fluoroscopic C-arm images are provided from the OR, demonstrating placement of a plate and screws across the mid to distal femur, transfixing the comminuted distal femoral fracture with mild residual medial displacement and anterior angulation. No new fractures are seen. The patient's total knee arthroplasty is grossly unremarkable in appearance. IMPRESSION: Status post internal fixation of distal left femur, with mild residual medial displacement and anterior angulation. Electronically Signed   By: Roanna Raider M.D.   On: 08/05/2016 23:58   Scheduled Meds: . amLODipine  5 mg Oral Daily  . aspirin EC  325 mg Oral Q breakfast  . atorvastatin  10 mg Oral q1800  . budesonide (PULMICORT) nebulizer solution  1 mg Nebulization BID  . buPROPion  150 mg Oral Daily  . docusate sodium  100 mg Oral BID  . feeding supplement  1 Container Oral TID BM  . gabapentin  300 mg Oral QHS  . hydrALAZINE  25 mg Oral Q8H  . Influenza vac split quadrivalent PF  0.5 mL Intramuscular Tomorrow-1000  . isosorbide mononitrate  30 mg Oral Daily  . metoprolol succinate  50 mg Oral Daily  . pantoprazole  40 mg Oral Daily  . sodium chloride flush  3 mL Intravenous Q12H   Continuous Infusions:   LOS: 5 days   Merlene Laughter, DO [facility] Pager [phone]  If 7PM-7AM, please contact night-coverage [email or web address] Password [email or web address] 08/07/2016, 4:14 PM

## 2016-08-07 NOTE — Progress Notes (Signed)
Brought CPAP up to patients room because pt agreed to wear it for tonight. Got CPAP setup and on patient with 2 LPM bled in. Once mask was on patient she said to take it off. RT asked patient what was wrong and what could be adjusted. She said it was not her home mask and she did not want it back on. Mask was taken off and RN made aware. Rapid Response RN was notified of events. RT went in again and made multiple attempts to get patient to wear but again she said she has a mask at home but does not want to wear the CPAP here. RN made aware. Patient remains on 2 LPM nasal cannula. SPO2 98% HR 104.

## 2016-08-07 NOTE — Significant Event (Signed)
Rapid Response Event Note During rounds RN concerned because pt was lethargic  Overview: Time Called: 2346 Arrival Time: 2346 Event Type: Respiratory  Initial Focused Assessment: Pt easily aroused to verbal stimuli but quickly doses off, alert and oriented x3, SpO2 98-100% 2L [name], pt refuses to wear cpap as ordered.   Interventions: ABG 7.28/34/115/15.9. Encouraged by RN and RT to wear cpap. Pt refuses to do so.  Plan of Care (if not transferred): Lisa Candlengelo RN aware to continue to monitor pt and call as needed.  Event Summary: Name of Physician Notified: Lisa CottaKirby NP  at 2348    at    Outcome: Stayed in room and stabalized     [place], Lisa [name]

## 2016-08-07 NOTE — Progress Notes (Signed)
Pt continue to refuse wear C-PAP tonight. MD on call made aware, will continue to monitor pt. O2 Sat=98/2L.

## 2016-08-07 NOTE — Progress Notes (Signed)
Rt Note: Rt was called by patients nurse Alona BeneJoyce to come and assess patient since she felt that she was lethargic and wanted to see if we can go ahead and place her on her Cpap. When RT entered the patients room, the patient was alert and oriented and in no respiratory distress. Patient was able to answer questions appropriately. Rt asked if she wanted to go ahead and be placed on her Cpap and the patient said no, refusing to be placed on it presently. Rt asked the patient if Rt can check back on her at a later time and see if she is willing to be placed on Cpap at that time and she agreed to that. Patient remains on her nasal cannula presently with no distress. Rt will pass on to the night shift Rt what happened and that patient is willing to try her Cpap tonight.

## 2016-08-08 DIAGNOSIS — K219 Gastro-esophageal reflux disease without esophagitis: Secondary | ICD-10-CM

## 2016-08-08 DIAGNOSIS — N179 Acute kidney failure, unspecified: Secondary | ICD-10-CM

## 2016-08-08 LAB — CBC WITH DIFFERENTIAL/PLATELET
BASOS ABS: 0.1 10*3/uL (ref 0.0–0.1)
Basophils Relative: 0 %
Eosinophils Absolute: 0.2 10*3/uL (ref 0.0–0.7)
Eosinophils Relative: 1 %
HEMATOCRIT: 24.4 % — AB (ref 36.0–46.0)
HEMOGLOBIN: 8 g/dL — AB (ref 12.0–15.0)
LYMPHS PCT: 15 %
Lymphs Abs: 2 10*3/uL (ref 0.7–4.0)
MCH: 29.6 pg (ref 26.0–34.0)
MCHC: 32.8 g/dL (ref 30.0–36.0)
MCV: 90.4 fL (ref 78.0–100.0)
Monocytes Absolute: 1 10*3/uL (ref 0.1–1.0)
Monocytes Relative: 7 %
NEUTROS ABS: 10.4 10*3/uL — AB (ref 1.7–7.7)
Neutrophils Relative %: 77 %
Platelets: 105 10*3/uL — ABNORMAL LOW (ref 150–400)
RBC: 2.7 MIL/uL — AB (ref 3.87–5.11)
RDW: 18.5 % — ABNORMAL HIGH (ref 11.5–15.5)
WBC: 13.6 10*3/uL — AB (ref 4.0–10.5)

## 2016-08-08 LAB — COMPREHENSIVE METABOLIC PANEL
ALT: 9 U/L — AB (ref 14–54)
AST: 30 U/L (ref 15–41)
Albumin: 1.6 g/dL — ABNORMAL LOW (ref 3.5–5.0)
Alkaline Phosphatase: 115 U/L (ref 38–126)
Anion gap: 7 (ref 5–15)
BILIRUBIN TOTAL: 1.9 mg/dL — AB (ref 0.3–1.2)
BUN: 63 mg/dL — AB (ref 6–20)
CO2: 16 mmol/L — ABNORMAL LOW (ref 22–32)
CREATININE: 2.55 mg/dL — AB (ref 0.44–1.00)
Calcium: 7.9 mg/dL — ABNORMAL LOW (ref 8.9–10.3)
Chloride: 110 mmol/L (ref 101–111)
GFR calc Af Amer: 20 mL/min — ABNORMAL LOW (ref >60)
GFR, EST NON AFRICAN AMERICAN: 17 mL/min — AB (ref >60)
Glucose, Bld: 97 mg/dL (ref 65–99)
Potassium: 4.4 mmol/L (ref 3.5–5.1)
Sodium: 133 mmol/L — ABNORMAL LOW (ref 135–145)
TOTAL PROTEIN: 6.5 g/dL (ref 6.5–8.1)

## 2016-08-08 LAB — MAGNESIUM: MAGNESIUM: 1.8 mg/dL (ref 1.7–2.4)

## 2016-08-08 LAB — GLUCOSE, CAPILLARY: Glucose-Capillary: 124 mg/dL — ABNORMAL HIGH (ref 65–99)

## 2016-08-08 LAB — PHOSPHORUS: Phosphorus: 5.4 mg/dL — ABNORMAL HIGH (ref 2.5–4.6)

## 2016-08-08 NOTE — Progress Notes (Signed)
   Subjective: 3 Days Post-Op Procedure(s) (LRB): EXTERNAL FIXATION FEMUR, ORIF FEMUR (Left) CYSTOSCOPY WITH RETROGRADE PYELOGRAM/URETERAL STENT PLACEMENT (Left) Patient reports pain as mild and moderate.    Objective: Vital signs in last 24 hours: Temp:  [98.2 F (36.8 C)-98.4 F (36.9 C)] 98.4 F (36.9 C) (02/01 0420) Pulse Rate:  [84-89] 84 (02/01 0420) Resp:  [18-19] 19 (02/01 0420) BP: (103-116)/(58-76) 103/76 (02/01 0420) SpO2:  [99 %-100 %] 100 % (02/01 0420) Weight:  [234 lb 14.4 oz (106.5 kg)] 234 lb 14.4 oz (106.5 kg) (02/01 0420)  Intake/Output from previous day: 01/31 0701 - 02/01 0700 In: 740 [P.O.:740] Out: 1300 [Urine:1300] Intake/Output this shift: No intake/output data recorded.   Recent Labs  08/06/16 0803 08/07/16 0451 08/08/16 0416  HGB 6.9* 7.9* 8.0*    Recent Labs  08/07/16 0451 08/08/16 0416  WBC 14.3* 13.6*  RBC 2.72* 2.70*  HCT 24.4* 24.4*  PLT 82* 105*    Recent Labs  08/07/16 0451 08/08/16 0416  NA 132* 133*  K 4.2 4.4  CL 110 110  CO2 16* 16*  BUN 60* 63*  CREATININE 2.64* 2.55*  GLUCOSE 158* 97  CALCIUM 7.7* 7.9*    Recent Labs  08/06/16 0536  INR 1.46    Neurologically intact No results found.  Assessment/Plan: 3 Days Post-Op Procedure(s) (LRB): EXTERNAL FIXATION FEMUR, ORIF FEMUR (Left) CYSTOSCOPY WITH RETROGRADE PYELOGRAM/URETERAL STENT PLACEMENT (Left) REMOVE KNEE IMMOBILIZER. DRESSING CHANGE. SNF  Eldred MangesMark C Yates 08/08/2016, 8:23 AM

## 2016-08-08 NOTE — Progress Notes (Signed)
PROGRESS NOTE    Lisa SchneidersGrace A Eng  RUE:[id number] DOB: 1941-07-05 DOA: 08/02/2016 PCP: [name],[name], MD   Brief Narrative:  76 year old femalewith medical history significant foratrial fibrillation on Eliquis, hypertension, CAD status post CABG, COPD with chronic respiratory failure, OSA, and chronic diastolic CHF who presents to the emergency department from her nursing facility for evaluation of severe left leg pain following a fall. Patient was evaluated in the emergency department. Found to have a left supracondylar fracture of the femur along with an oblique fracture involving the distal diaphysis of the left fibula. Patient was also noted to be anemic. Hospitalized for further management. Patient was seen by orthopedics. Patient is high risk for perioperative mortality. She was seen by cardiology. Patient also found to have left-sided hydronephrosis for which patient was seen by urology. Patient underwent surgical intervention and is POD 3 of External Fixation, ORIF of Femur and Cystoscopy with Retrograde Pyelogram/Ureteral Stent Placement.   Assessment & Plan:   Principal Problem:   Femur fracture, left (HCC) Active Problems:   GERD (gastroesophageal reflux disease)   Hypertension   Chronic diastolic heart failure (HCC)   OSA (obstructive sleep apnea)   Chronic respiratory failure with hypoxia (HCC)   Pressure sore   Anemia, normocytic normochromic   CAD (coronary artery disease)   Fibula fracture   Coagulopathy (HCC)   Hypertensive heart disease  Left femur fracture, left fibula fracture  -This was as a result of a mechanical fall.  -Orthopedic surgery has seen the patient. Anticoagulation was discontinued for surgery.  -Due to patient's complicated cardiac history, cardiology was consulted to provide a preoperative assessment.  -Patient is high-risk for perioperative mortality, with risk of perioperative cardiac complications 3-4% per Amie CritchleyGupta, et.al.. Patient is status post  ORIF POD 3.  -Per Orthopedics -Continue to Monitor -WBC elevated posteroperatively; Likely reactive will monitor for S/Sx of Infection and trending down -Repeat CBC in AM -Will need SNF  AKI superimposed on CKD stage III with hyperkalemia/left-sided hydronephrosis -Patient's baseline appears to be between 1.5 and 2.0.  -Creatinine was 3.1 on admission.  -Patient appeared to be intravascularly depleted. She was given IV fluids. Renal function appears to have plateaued.  -Evaluation has revealed left-sided hydronephrosis with concern for obstruction at UPJ. This is likely contributing to persistent elevation in creatinine. Urology has seen the patient and underwent stent placement. Urine output is present but low. Continue to monitor for now. -Per Urology will need a Ureteroscopy in 6-8 weeks -Urine Cx from OR showed 400 Colonies of Enterococcus Faecium  -If there is no improvement despite stent placement may need to involve nephrology. No evidence for volume overload at this time. Continue gentle hydration. Potassium has improved. -Urology Recommends keeping foley until she is more ambulatory -BUN/Cr slightly improved to 63/2.55 -Repeat CMP in AM  Lethargy/Encephalopathy,  -More alert and awake today; Again refused CPAP -RRT was called on the night of 08/07/16 because she was more lethargic -ABG wnL and patient saturating Fine -Patient did not wear CPAP -Hold Sedating Medications; Possibly from Pain Control with Morphine and Oxycodone IR -Continue to Monitor  Normocytic anemia/Thrombocytopenia  -Hgb is 7.2 on admission; recent priors have been in 7-9 range.  -Hemoglobin did drop to 6.7 on 1/27. She was transfused 2 units of blood. Hemoglobin improved. -Postoperatively, it has dropped again. She was transfused an additional 1 unit of PRBC's because of Antibodies -Hb/Hct went from 7.9/24.4 -> 8.0/24.4 and Platelet Count went from 113 -> 82 -> 105 -Repeat CBC with Diff  in AM    Paroxysmal atrial fibrillation -Appears to be in atrial fibrillation.  -CHADS-VASc at least 12 (age x2, gender, CHF, HTN).  -She was anticoagulated with Eliquis; this was held on admission in preparation for surgery. -Continue metoprolol as tolerated. Resume anticoagulation when okay with orthopedics. -*will discuss with Dr. Ophelia Charter  Chronic diastolic CHF  -Patient appeared to be intravascularly depleted on admission.  -Gentle IV hydration.  -TTE (05/05/16) with EF 50-55%, moderate LVH, grade 1 diastolic dysfunction, moderate LAE, mild-mod TR, and moderate increase in PA pressures. -Lasix held on admission in light of AKI and apparent dehydration.  -Strict ins and outs and daily weights.  CAD -No anginal complaints on admission.  -Pt is s/p CABG and follows with cardiology in outpatient setting.  -Cardiology has seen the patient. No testing ordered at this time.  Hyperbilirubinemia, coagulopathy -Still elevated at 1.9 -Reason for hyperbilirubinemia is not entirely clear.  -Bilirubin was fractionated and appears to be equal, direct and indirect. Ultrasound suggests fatty infiltration of the liver.  -Alkaline phosphatase is normal now. CBD is nondistended. Coagulopathy could have been due to anticoagulation. -Repeat CMP in AM  COPD, OSA, chronic respiratory failure -Patient uses 2 Lpm supplemental oxygen at baseline.  -Respiratory status appears to be stable on admission. Continue supplemental O2, CPAP qHS.  Essential Hypertension -Continue Toprol, Norvasc, and hydralazine as tolerated   DVT prophylaxis: SCD's for now and will resume Anticoagulation when ok with Orthopedic Surgery Code Status: FULL CODE Family Communication: No Family at Bedside Disposition Plan: SNF when Stable  Consultants:   Urology  Cardiology  Orthopedic Surgery  Procedures: POD 3 of External Fixation, ORIF of Femur and Cystoscopy with Retrograde Pyelogram/Ureteral Stent Placement.     Antimicrobials: Anti-infectives    Start     Dose/Rate Route Frequency Ordered Stop   08/04/16 0600  ceFAZolin (ANCEF) 3 g in dextrose 5 % 50 mL IVPB  Status:  Discontinued     3 g 130 mL/hr over 30 Minutes Intravenous To [facility] 08/03/16 2315 08/05/16 2307     Subjective: Seen and examined was sleepy but answered questions appropriately. Stated her Left Leg hurt. No Nausea or Vomiting. Did not wear CPAP again overnight.    Objective: Vitals:   08/07/16 2047 08/07/16 2100 08/08/16 0420 08/08/16 1430  BP:  116/64 103/76 (!) 109/54  Pulse:  87 84 86  Resp:  19 19   Temp:  98.3 F (36.8 C) 98.4 F (36.9 C) 99.5 F (37.5 C)  TempSrc:  Oral Oral Oral  SpO2: 100% 99% 100% 93%  Weight:   106.5 kg (234 lb 14.4 oz)   Height:        Intake/Output Summary (Last 24 hours) at 08/08/16 1854 Last data filed at 08/08/16 1300  Gross per 24 hour  Intake             1220 ml  Output             1125 ml  Net               95 ml   Filed Weights   08/04/16 0433 08/05/16 0428 08/08/16 0420  Weight: 103.2 kg (227 lb 9.6 oz) 103.7 kg (228 lb 11.2 oz) 106.5 kg (234 lb 14.4 oz)   Examination: Physical Exam:  Constitutional: NAD and appears calm and comfortable; Appeared less sleepy today but still a little somnolent Eyes: Lids and conjunctivae normal, sclerae anicteric  ENMT: External Ears, Nose appear normal. Grossly normal hearing.  Neck: Appears normal, supple, no cervical masses, normal ROM, no appreciable thyromegaly, no JVD Respiratory: Clear to auscultation bilaterally, no wheezing, rales, rhonchi or crackles. Normal respiratory effort and patient is not tachypenic.  Cardiovascular: RRR, no murmurs / rubs / gallops. S1 and S2 auscultated.  Abdomen: Soft, non-tender, non-distended. No masses palpated. No appreciable hepatosplenomegaly. Bowel sounds positive x4.  GU: Deferred. Foley Catheter in place draining dark colored urine.  Musculoskeletal: No clubbing / cyanosis of  digits/nails. Let Leg Braced and dressed Skin: No rashes, lesions, ulcers on limited skin evaluation. No induration; Warm and dry.  Neurologic: CN 2-12 grossly intact with no focal deficits.Romberg sign cerebellar reflexes not assessed.  Psychiatric: Normal judgment and insight. Awake and oriented x 3. Normal mood and appropriate affect.   Data Reviewed: I have personally reviewed following labs and imaging studies  CBC:  Recent Labs Lab 08/02/16 2144 08/03/16 0555 08/04/16 0457 08/05/16 0516 08/06/16 0803 08/07/16 0451 08/08/16 0416  WBC 13.1* 12.9* 11.2* 11.7* 14.3* 14.3* 13.6*  NEUTROABS 11.0* 9.8*  --   --   --   --  10.4*  HGB 7.2* 6.3* 8.7* 8.2* 6.9* 7.9* 8.0*  HCT 23.0* 20.0* 27.1* 25.7* 21.3* 24.4* 24.4*  MCV 93.1 93.0 90.0 91.1 93.0 89.7 90.4  PLT 138* 121* 85* 100* 113* 82* 105*   Basic Metabolic Panel:  Recent Labs Lab 08/04/16 0457 08/05/16 0516 08/06/16 0803 08/07/16 0451 08/08/16 0416  NA 134* 133* 137 132* 133*  K 5.3* 4.8 4.9 4.2 4.4  CL 112* 109 111 110 110  CO2 16* 14* 18* 16* 16*  GLUCOSE 101* 92 88 158* 97  BUN 66* 64* 62* 60* 63*  CREATININE 2.72* 2.75* 2.71* 2.64* 2.55*  CALCIUM 8.0* 7.9* 7.9* 7.7* 7.9*  MG  --   --   --   --  1.8  PHOS  --   --   --   --  5.4*   GFR: Estimated Creatinine Clearance: 22 mL/min (by C-G formula based on SCr of 2.55 mg/dL (H)). Liver Function Tests:  Recent Labs Lab 08/02/16 2144 08/03/16 0412 08/04/16 0457 08/04/16 1404 08/06/16 0803 08/08/16 0416  AST 58* 56* 53*  --  36 30  ALT 39 40 35  --  20 9*  ALKPHOS 181* 181* 152*  --  108 115  BILITOT 2.4* 2.7* 2.6* 2.4* 1.9* 1.9*  PROT 7.1 6.6 6.5  --  5.8* 6.5  ALBUMIN 1.8* 1.6* 1.7*  --  1.5* 1.6*   No results for input(s): LIPASE, AMYLASE in the last 168 hours. No results for input(s): AMMONIA in the last 168 hours. Coagulation Profile:  Recent Labs Lab 08/02/16 2144 08/03/16 0412 08/04/16 0457 08/05/16 0516 08/06/16 0536  INR 2.00 1.99 1.75  1.42 1.46   Cardiac Enzymes: No results for input(s): CKTOTAL, CKMB, CKMBINDEX, TROPONINI in the last 168 hours. BNP (last 3 results) No results for input(s): PROBNP in the last 8760 hours. HbA1C: No results for input(s): HGBA1C in the last 72 hours. CBG:  Recent Labs Lab 08/06/16 0813 08/06/16 2030 08/07/16 0801 08/07/16 1823 08/08/16 0756  GLUCAP 86 170* 214* 108* 124*   Lipid Profile: No results for input(s): CHOL, HDL, LDLCALC, TRIG, CHOLHDL, LDLDIRECT in the last 72 hours. Thyroid Function Tests: No results for input(s): TSH, T4TOTAL, FREET4, T3FREE, THYROIDAB in the last 72 hours. Anemia Panel: No results for input(s): VITAMINB12, FOLATE, FERRITIN, TIBC, IRON, RETICCTPCT in the last 72 hours. Sepsis Labs: No results for input(s): PROCALCITON, LATICACIDVEN in the  last 168 hours.  Recent Results (from the past 240 hour(s))  MRSA PCR Screening     Status: None   Collection Time: 08/03/16  4:43 AM  Result Value Ref Range Status   MRSA by PCR NEGATIVE NEGATIVE Final    Comment:        The GeneXpert MRSA Assay (FDA approved for NASAL specimens only), is one component of a comprehensive MRSA colonization surveillance program. It is not intended to diagnose MRSA infection nor to guide or monitor treatment for MRSA infections.   Urine culture     Status: Abnormal (Preliminary result)   Collection Time: 08/05/16 10:44 PM  Result Value Ref Range Status   Specimen Description CYSTOSCOPY  Final   Special Requests PT ON ANCEF  Final   Culture 400 COLONIES/mL ENTEROCOCCUS FAECIUM (A)  Final   Report Status PENDING  Incomplete    Radiology Studies: No results found. Scheduled Meds: . amLODipine  5 mg Oral Daily  . aspirin EC  325 mg Oral Q breakfast  . atorvastatin  10 mg Oral q1800  . budesonide (PULMICORT) nebulizer solution  1 mg Nebulization BID  . buPROPion  150 mg Oral Daily  . docusate sodium  100 mg Oral BID  . feeding supplement  1 Container Oral TID BM  .  gabapentin  300 mg Oral QHS  . hydrALAZINE  25 mg Oral Q8H  . Influenza vac split quadrivalent PF  0.5 mL Intramuscular Tomorrow-1000  . isosorbide mononitrate  30 mg Oral Daily  . metoprolol succinate  50 mg Oral Daily  . pantoprazole  40 mg Oral Daily  . sodium chloride flush  3 mL Intravenous Q12H   Continuous Infusions:   LOS: 6 days   Merlene Laughter, DO [facility] Pager [phone]  If 7PM-7AM, please contact night-coverage [email or web address] Password [facility] 08/08/2016, 6:54 PM

## 2016-08-08 NOTE — Progress Notes (Signed)
Placed patient on CPAP for the night with oxygen set at 2lpm. 

## 2016-08-08 NOTE — Progress Notes (Signed)
Physical Therapy Treatment Patient Details Name: Lisa [name] MRN: [id number] DOB: 11-29-1940 Today's Date: 08/08/2016    History of Present Illness 76 yo female s/p external fixator removal and L ORIF lateral Biomet femoral plating     PT Comments    Pt admitted with above diagnosis. Pt currently with functional limitations due to balance and endurance deficitsas well as weakness. Pt still total assist of 2 persons to sit EOB.  Pt does try but just has little energy.  STates she is sleepy and was tearful during session.  Pt will benefit from skilled PT to increase their independence and safety with mobility to allow discharge to the venue listed below.    Follow Up Recommendations  SNF;Supervision/Assistance - 24 hour     Equipment Recommendations  None recommended by PT    Recommendations for Other Services       Precautions / Restrictions Precautions Precautions: Fall Required Braces or Orthoses: Knee Immobilizer - Left (d/c today per MD order) Restrictions Weight Bearing Restrictions: Yes LLE Weight Bearing: Partial weight bearing LLE Partial Weight Bearing Percentage or Pounds: 50%    Mobility  Bed Mobility Overal bed mobility: Needs Assistance Bed Mobility: Supine to Sit;Sit to Supine     Supine to sit: +2 for physical assistance;+2 for safety/equipment;Total assist Sit to supine: +2 for safety/equipment;+2 for physical assistance;Total assist   General bed mobility comments: pt sleepy and not reaching with bil UE for bed rail. Pt however required total (A) support of L LE and pad used to helicopter pivot to EOB.   Transfers                 General transfer comment: not assessed at this time. pt unable to maintain EOB static sitting and will be total +2 for attempt  Ambulation/Gait                 Stairs            Wheelchair Mobility    Modified Rankin (Stroke Patients Only)       Balance Overall balance assessment: Needs  assistance Sitting-balance support: Bilateral upper extremity supported;Feet supported Sitting balance-Leahy Scale: Poor Sitting balance - Comments: Pt unable to sit EOB without constant support of therapy staff.  did progress to min assist for brief periods of sitting for brief periods but overall mod to tot assist to sit EOB for 10 minutes.  Pt would hold herself and then flop back quickly and just kept saying she could not stay awake.   Postural control: Posterior lean                          Cognition Arousal/Alertness: Awake/alert Behavior During Therapy: WFL for tasks assessed/performed Overall Cognitive Status: Impaired/Different from baseline Area of Impairment: Orientation Orientation Level: Time             General Comments: pt delayed response to questions.     Exercises  Tried to kick LEs when on EOB with no success moving left LE and able to kick right LE.  Could not lift either arm to command.      General Comments General comments (skin integrity, edema, etc.): Assisted pt with her meal at end of treatment as it was in her room.  Pt could not feed herself and needed assist to even hold her cup.       Pertinent Vitals/Pain Pain Assessment: Faces Faces Pain Scale: Hurts little more Pain Location: L  LE Pain Descriptors / Indicators: Grimacing Pain Intervention(s): Limited activity within patient's tolerance;Monitored during session;Premedicated before session;Repositioned    Home Living                      Prior Function            PT Goals (current goals can now be found in the care plan section) Progress towards PT goals: Progressing toward goals    Frequency    Min 2X/week      PT Plan Frequency needs to be updated    Co-evaluation             End of Session Equipment Utilized During Treatment: Gait belt;Oxygen Activity Tolerance: Patient limited by fatigue;Patient limited by pain Patient left: in bed;with call  bell/phone within reach;with nursing/sitter in room     Time: 1453-1517 PT Time Calculation (min) (ACUTE ONLY): 24 min  Charges:  $Therapeutic Activity: 23-37 mins                    G Codes:      Berline LopesDawn F Gaylene Moylan 08/08/2016, 4:56 PM Entergy CorporationDawn Charee Tumblin,PT Acute Rehabilitation [phone] (pager)

## 2016-08-09 LAB — URINALYSIS, ROUTINE W REFLEX MICROSCOPIC
GLUCOSE, UA: NEGATIVE mg/dL
Ketones, ur: 15 mg/dL — AB
Nitrite: POSITIVE — AB
Protein, ur: >300 mg/dL — AB
SPECIFIC GRAVITY, URINE: 1.02 (ref 1.005–1.030)
pH: 5 (ref 5.0–8.0)

## 2016-08-09 LAB — COMPREHENSIVE METABOLIC PANEL
ALBUMIN: 1.4 g/dL — AB (ref 3.5–5.0)
ALK PHOS: 140 U/L — AB (ref 38–126)
ALT: 16 U/L (ref 14–54)
ANION GAP: 12 (ref 5–15)
AST: 61 U/L — AB (ref 15–41)
BILIRUBIN TOTAL: 1.8 mg/dL — AB (ref 0.3–1.2)
BUN: 61 mg/dL — AB (ref 6–20)
CALCIUM: 8 mg/dL — AB (ref 8.9–10.3)
CO2: 17 mmol/L — ABNORMAL LOW (ref 22–32)
CREATININE: 2.23 mg/dL — AB (ref 0.44–1.00)
Chloride: 106 mmol/L (ref 101–111)
GFR calc Af Amer: 23 mL/min — ABNORMAL LOW (ref >60)
GFR calc non Af Amer: 20 mL/min — ABNORMAL LOW (ref >60)
GLUCOSE: 130 mg/dL — AB (ref 65–99)
Potassium: 3.7 mmol/L (ref 3.5–5.1)
Sodium: 135 mmol/L (ref 135–145)
TOTAL PROTEIN: 6.3 g/dL — AB (ref 6.5–8.1)

## 2016-08-09 LAB — CBC WITH DIFFERENTIAL/PLATELET
BASOS PCT: 0 %
Basophils Absolute: 0 10*3/uL (ref 0.0–0.1)
Eosinophils Absolute: 0.2 10*3/uL (ref 0.0–0.7)
Eosinophils Relative: 2 %
HEMATOCRIT: 21.5 % — AB (ref 36.0–46.0)
HEMOGLOBIN: 7.2 g/dL — AB (ref 12.0–15.0)
Lymphocytes Relative: 17 %
Lymphs Abs: 1.5 10*3/uL (ref 0.7–4.0)
MCH: 30.3 pg (ref 26.0–34.0)
MCHC: 33.5 g/dL (ref 30.0–36.0)
MCV: 90.3 fL (ref 78.0–100.0)
MONOS PCT: 9 %
Monocytes Absolute: 0.8 10*3/uL (ref 0.1–1.0)
NEUTROS ABS: 6.1 10*3/uL (ref 1.7–7.7)
NEUTROS PCT: 71 %
Platelets: 106 10*3/uL — ABNORMAL LOW (ref 150–400)
RBC: 2.38 MIL/uL — ABNORMAL LOW (ref 3.87–5.11)
RDW: 18.4 % — ABNORMAL HIGH (ref 11.5–15.5)
WBC: 8.6 10*3/uL (ref 4.0–10.5)

## 2016-08-09 LAB — URINALYSIS, MICROSCOPIC (REFLEX)

## 2016-08-09 LAB — URINE CULTURE: Culture: 400 — AB

## 2016-08-09 LAB — PHOSPHORUS: Phosphorus: 4.4 mg/dL (ref 2.5–4.6)

## 2016-08-09 LAB — MAGNESIUM: Magnesium: 1.8 mg/dL (ref 1.7–2.4)

## 2016-08-09 LAB — GLUCOSE, CAPILLARY: Glucose-Capillary: 123 mg/dL — ABNORMAL HIGH (ref 65–99)

## 2016-08-09 MED ORDER — APIXABAN 5 MG PO TABS
5.0000 mg | ORAL_TABLET | Freq: Two times a day (BID) | ORAL | 3 refills | Status: DC
Start: 2016-08-09 — End: 2016-08-10

## 2016-08-09 MED ORDER — HYDROCODONE-ACETAMINOPHEN 5-325 MG PO TABS: 1.0000 | ORAL_TABLET | Freq: Four times a day (QID) | ORAL | 0 refills | Status: DC | PRN

## 2016-08-09 MED ORDER — APIXABAN 5 MG PO TABS
5.0000 mg | ORAL_TABLET | Freq: Two times a day (BID) | ORAL | Status: DC
  Administered 2016-08-09: 5 mg via ORAL
  Filled 2016-08-09: qty 1

## 2016-08-09 MED ORDER — APIXABAN 2.5 MG PO TABS
2.5000 mg | ORAL_TABLET | Freq: Two times a day (BID) | ORAL | Status: DC
  Administered 2016-08-09 – 2016-08-10 (×2): 2.5 mg via ORAL
  Filled 2016-08-09 (×2): qty 1

## 2016-08-09 NOTE — Discharge Instructions (Signed)
LEFT Ureteral Stent Implantation, Care After Introduction Refer to this sheet in the next few weeks. These instructions provide you with information about caring for yourself after your procedure. Your health care provider may also give you more specific instructions. Your treatment has been planned according to current medical practices, but problems sometimes occur. Call your health care provider if you have any problems or questions after your procedure. Your left ureteral stent is temporary. It's very important to follow-up with Dr. Mena GoesEskridge to have the stent removed or exchanged.   What can I expect after the procedure? After the procedure, it is common to have:  Nausea.  Mild pain when you urinate. You may feel this pain in your lower back or lower abdomen. Pain should stop within a few minutes after you urinate. This may last for up to 1 week.  A small amount of blood in your urine for several days. Follow these instructions at home:   Medicines  Take over-the-counter and prescription medicines only as told by your health care provider.  If you were prescribed an antibiotic medicine, take it as told by your health care provider. Do not stop taking the antibiotic even if you start to feel better.  Do not drive for 24 hours if you received a sedative.  Do not drive or operate heavy machinery while taking prescription pain medicines. Activity  Return to your normal activities as told by your health care provider. Ask your health care provider what activities are safe for you.  Do not lift anything that is heavier than 10 lb (4.5 kg). Follow this limit for 1 week after your procedure, or for as long as told by your health care provider. General instructions  Watch for any blood in your urine. Call your health care provider if the amount of blood in your urine increases.  If you have a catheter:  Follow instructions from your health care provider about taking care of your catheter  and collection bag.  Do not take baths, swim, or use a hot tub until your health care provider approves.  Drink enough fluid to keep your urine clear or pale yellow.  Keep all follow-up visits as told by your health care provider. This is important. Contact a health care provider if:  You have pain that gets worse or does not get better with medicine, especially pain when you urinate.  You have difficulty urinating.  You feel nauseous or you vomit repeatedly during a period of more than 2 days after the procedure. Get help right away if:  Your urine is dark red or has blood clots in it.  You are leaking urine (have incontinence).  The end of the stent comes out of your urethra.  You cannot urinate.  You have sudden, sharp, or severe pain in your abdomen or lower back.  You have a fever.     ORTHOPEDIC DISCHARGE INSTRUCTIONS -SKILLED FACILITY RN TO DO DAILY WOUND CHECKS.  DRESSING CHANGES WITH 4X4 GAUZE AND TAPE.  DO NOT APPLY ANY CREAMS OR OINTMENTS TO THE INCISION.  IF ANY QUESTIONS ABOUT WOUND HEALING, OUR OFFICE SHOULD BE CONTACTED IMMEDIATELY.   -PARTIAL WEIGHTBEARING 50% LEFT LEG.  MUST USE KNEE IMMOBILIZER AT ALL TIMES.  DO NOT BEND KNEE.  -OK TO SHOWER BUT NO TUB SOAKING.

## 2016-08-09 NOTE — Clinical Social Work Note (Signed)
Per MD, pt may be ready for discharge on Satuday. [facility]. CSW will continue to follow.   Dede QuerySarah Tyyne Cliett, MSW, LCS W Clinical Social Worker  [phone]

## 2016-08-09 NOTE — Progress Notes (Signed)
PROGRESS NOTE    Lisa [name]  UYQ:[id number] DOB: 21-Sep-1940 DOA: 08/02/2016 PCP: Laurena Slimmer, MD   Brief Narrative:  76 year old femalewith medical history significant foratrial fibrillation on Eliquis, hypertension, CAD status post CABG, COPD with chronic respiratory failure, OSA, and chronic diastolic CHF who presents to the emergency department from her nursing facility for evaluation of severe left leg pain following a fall. Patient was evaluated in the emergency department. Found to have a left supracondylar fracture of the femur along with an oblique fracture involving the distal diaphysis of the left fibula. Patient was also noted to be anemic. Hospitalized for further management. Patient was seen by orthopedics. Patient is high risk for perioperative mortality. She was seen by cardiology. Patient also found to have left-sided hydronephrosis for which patient was seen by urology. Patient underwent surgical intervention and is POD 4 of External Fixation, ORIF of Femur and Cystoscopy with Retrograde Pyelogram/Ureteral Stent Placement.   Assessment & Plan:   Principal Problem:   Femur fracture, left (HCC) Active Problems:   GERD (gastroesophageal reflux disease)   Hypertension   Chronic diastolic heart failure (HCC)   OSA (obstructive sleep apnea)   Chronic respiratory failure with hypoxia (HCC)   Pressure sore   Anemia, normocytic normochromic   CAD (coronary artery disease)   Fibula fracture   Coagulopathy (HCC)   Hypertensive heart disease  Left femur fracture, left fibula fracture  -This was as a result of a mechanical fall.  -Orthopedic surgery has seen the patient. Anticoagulation was discontinued for surgery.  -Due to patient's complicated cardiac history, cardiology was consulted to provide a preoperative assessment.  -Patient is high-risk for perioperative mortality, with risk of perioperative cardiac complications 3-4% per Amie Critchley.. Patient is status post  ORIF POD 4.  -Per Orthopedics -Continue to Monitor -WBC elevated posteroperatively and now improved as it went from 13.6 -> 8.6; Likely reactive; -Continue to monitor for S/Sx of Infection and trending down -Repeat CBC in AM -Will need SNF and has bed at [facility]  AKI superimposed on CKD stage III with hyperkalemia/left-sided hydronephrosis -Patient's baseline appears to be between 1.5 and 2.0.  -Creatinine was 3.1 on admission.  -Patient appeared to be intravascularly depleted. She was given IV fluids. Renal function appears to have plateaued.  -Evaluation has revealed left-sided hydronephrosis with concern for obstruction at [facility]. This is likely contributing to persistent elevation in creatinine. Urology has seen the patient and underwent stent placement. Urine output is present but low. Continue to monitor for now. -Per Urology will need a Ureteroscopy in 6-8 weeks -Urine Cx from OR showed 400 Colonies of Enterococcus Faecium and was VRE; Likely not significant as does not seem to have Infection and will repeat Urinalysis and Urine Cx.  -Urology Recommends keeping foley until she is more ambulatory -BUN/Cr slightly improved from 63/2.55 -> 61/2.23 -Repeat CMP in AM  Lethargy/Encephalopathy, resolved -Improved and mentation was well this AM -RRT was called on the night of 08/07/16 because she was more lethargic -ABG wnL and patient saturating Fine -Patient did not wear CPAP that night -Hold Sedating Medications; Possibly from Pain Control with Morphine and Oxycodone IR -Continue to Monitor  Normocytic anemia/Thrombocytopenia  -Hgb is 7.2 on admission; recent priors have been in 7-9 range.  -Hemoglobin did drop to 6.7 on 1/27. She was transfused 2 units of blood. Hemoglobin improved. -Postoperatively, it has dropped again. She was transfused an additional 1 unit of PRBC's because of Antibodies -Hb/Hct went from 7.9/24.4 -> 8.0/24.4 ->  7.2/21.5 -Platelet Count went from 113  -> 82 -> 105 -> 106 -Repeat CBC with Diff in AM  -Apixaban restarted by Orthopedics   Paroxysmal Atrial Fibrillation -Appears to be in atrial fibrillation.  -CHADS-VASc at least 20 (age x2, gender, CHF, HTN).  -She was anticoagulated with Eliquis; this was held on admission in preparation for surgery. -Continue metoprolol as tolerated.  -Eliquis restarted by Orthopedics  Chronic diastolic CHF  -Patient appeared to be intravascularly depleted on admission.  -Gentle IV hydration now D/C'd  -TTE (05/05/16) with EF 50-55%, moderate LVH, grade 1 diastolic dysfunction, moderate LAE, mild-mod TR, and moderate increase in PA pressures. -Lasix held on admission in light of AKI and apparent dehydration.  -Strict ins and outs and daily weights.  CAD -No anginal complaints on admission.  -ASA 325 mg po daily -Pt is s/p CABG and follows with cardiology in outpatient setting.  -Cardiology has seen the patient. No testing ordered at this time.  Hyperbilirubinemia, coagulopathy -Still elevated at 1.8 -Reason for hyperbilirubinemia is not entirely clear.  -Bilirubin was fractionated and appears to be equal, direct and indirect. Ultrasound suggests fatty infiltration of the liver.  -Alkaline phosphatase is normal now. CBD is nondistended. Coagulopathy could have been due to anticoagulation. -Repeat CMP in AM  COPD, OSA, chronic respiratory failure -Patient uses 2 Lpm supplemental oxygen at baseline.  -Respiratory status appears to be stable on admission.  -Continue supplemental O2, CPAP qHS.  Essential Hypertension -Continue Toprol, Norvasc, and hydralazine as tolerated   DVT prophylaxis: Apixaban restarted by Orthopedics Code Status: FULL CODE Family Communication: No Family at Bedside Disposition Plan: SNF likely in AM if stable  Consultants:   Urology  Cardiology  Orthopedic Surgery  Procedures: POD 4 of External Fixation, ORIF of Femur and Cystoscopy with Retrograde  Pyelogram/Ureteral Stent Placement.    Antimicrobials: Anti-infectives    Start     Dose/Rate Route Frequency Ordered Stop   08/04/16 0600  ceFAZolin (ANCEF) 3 g in dextrose 5 % 50 mL IVPB  Status:  Discontinued     3 g 130 mL/hr over 30 Minutes Intravenous To [facility] 08/03/16 2315 08/05/16 2307     Subjective: Seen and examined was more alert. No complaints and states she wore CPAP. No other concerns or complaints at this time.   Objective: Vitals:   08/08/16 2322 08/09/16 0444 08/09/16 0959 08/09/16 1303  BP:  (!) 139/54  117/63  Pulse: 79 89  87  Resp: 18 17  18   Temp:  97.9 F (36.6 C)  97.8 F (36.6 C)  TempSrc:  Axillary  Oral  SpO2: 99% 97% 96% 97%  Weight:  104.8 kg (231 lb 1.6 oz)    Height:        Intake/Output Summary (Last 24 hours) at 08/09/16 1833 Last data filed at 08/09/16 1830  Gross per 24 hour  Intake              620 ml  Output             1400 ml  Net             -780 ml   Filed Weights   08/05/16 0428 08/08/16 0420 08/09/16 0444  Weight: 103.7 kg (228 lb 11.2 oz) 106.5 kg (234 lb 14.4 oz) 104.8 kg (231 lb 1.6 oz)   Examination: Physical Exam:  Constitutional: NAD and appears calm and comfortable; Eyes: Lids and conjunctivae normal, sclerae anicteric  ENMT: External Ears, Nose appear normal. Grossly normal  hearing.  Neck: Appears normal, supple, no cervical masses, normal ROM, no appreciable thyromegaly, no JVD Respiratory: Clear to auscultation bilaterally, no wheezing, rales, rhonchi or crackles. Normal respiratory effort and patient is not tachypenic.  Cardiovascular: RRR, no murmurs / rubs / gallops. S1 and S2 auscultated.  Abdomen: Soft, non-tender, non-distended. No masses palpated. No appreciable hepatosplenomegaly. Bowel sounds positive x4.  GU: Deferred. Foley Catheter in place draining dark colored urine.  Musculoskeletal: No clubbing / cyanosis of digits/nails. Let Leg Braced and dressed Skin: No rashes, lesions, ulcers on  limited skin evaluation. No induration; Warm and dry.  Neurologic: CN 2-12 grossly intact with no focal deficits.Romberg sign cerebellar reflexes not assessed.  Psychiatric: Normal judgment and insight. Awake and oriented x 3. Normal mood and appropriate affect.   Data Reviewed: I have personally reviewed following labs and imaging studies  CBC:  Recent Labs Lab 08/02/16 2144 08/03/16 0555  08/05/16 0516 08/06/16 0803 08/07/16 0451 08/08/16 0416 08/09/16 0639  WBC 13.1* 12.9*  < > 11.7* 14.3* 14.3* 13.6* 8.6  NEUTROABS 11.0* 9.8*  --   --   --   --  10.4* 6.1  HGB 7.2* 6.3*  < > 8.2* 6.9* 7.9* 8.0* 7.2*  HCT 23.0* 20.0*  < > 25.7* 21.3* 24.4* 24.4* 21.5*  MCV 93.1 93.0  < > 91.1 93.0 89.7 90.4 90.3  PLT 138* 121*  < > 100* 113* 82* 105* 106*  < > = values in this interval not displayed. Basic Metabolic Panel:  Recent Labs Lab 08/05/16 0516 08/06/16 0803 08/07/16 0451 08/08/16 0416 08/09/16 0639  NA 133* 137 132* 133* 135  K 4.8 4.9 4.2 4.4 3.7  CL 109 111 110 110 106  CO2 14* 18* 16* 16* 17*  GLUCOSE 92 88 158* 97 130*  BUN 64* 62* 60* 63* 61*  CREATININE 2.75* 2.71* 2.64* 2.55* 2.23*  CALCIUM 7.9* 7.9* 7.7* 7.9* 8.0*  MG  --   --   --  1.8 1.8  PHOS  --   --   --  5.4* 4.4   GFR: Estimated Creatinine Clearance: 24.9 mL/min (by C-G formula based on SCr of 2.23 mg/dL (H)). Liver Function Tests:  Recent Labs Lab 08/03/16 0412 08/04/16 0457 08/04/16 1404 08/06/16 0803 08/08/16 0416 08/09/16 0639  AST 56* 53*  --  36 30 61*  ALT 40 35  --  20 9* 16  ALKPHOS 181* 152*  --  108 115 140*  BILITOT 2.7* 2.6* 2.4* 1.9* 1.9* 1.8*  PROT 6.6 6.5  --  5.8* 6.5 6.3*  ALBUMIN 1.6* 1.7*  --  1.5* 1.6* 1.4*   No results for input(s): LIPASE, AMYLASE in the last 168 hours. No results for input(s): AMMONIA in the last 168 hours. Coagulation Profile:  Recent Labs Lab 08/02/16 2144 08/03/16 0412 08/04/16 0457 08/05/16 0516 08/06/16 0536  INR 2.00 1.99 1.75 1.42  1.46   Cardiac Enzymes: No results for input(s): CKTOTAL, CKMB, CKMBINDEX, TROPONINI in the last 168 hours. BNP (last 3 results) No results for input(s): PROBNP in the last 8760 hours. HbA1C: No results for input(s): HGBA1C in the last 72 hours. CBG:  Recent Labs Lab 08/06/16 2030 08/07/16 0801 08/07/16 1823 08/08/16 0756 08/09/16 0748  GLUCAP 170* 214* 108* 124* 123*   Lipid Profile: No results for input(s): CHOL, HDL, LDLCALC, TRIG, CHOLHDL, LDLDIRECT in the last 72 hours. Thyroid Function Tests: No results for input(s): TSH, T4TOTAL, FREET4, T3FREE, THYROIDAB in the last 72 hours. Anemia Panel: No results  for input(s): VITAMINB12, FOLATE, FERRITIN, TIBC, IRON, RETICCTPCT in the last 72 hours. Sepsis Labs: No results for input(s): PROCALCITON, LATICACIDVEN in the last 168 hours.  Recent Results (from the past 240 hour(s))  MRSA PCR Screening     Status: None   Collection Time: 08/03/16  4:43 AM  Result Value Ref Range Status   MRSA by PCR NEGATIVE NEGATIVE Final    Comment:        The GeneXpert MRSA Assay (FDA approved for NASAL specimens only), is one component of a comprehensive MRSA colonization surveillance program. It is not intended to diagnose MRSA infection nor to guide or monitor treatment for MRSA infections.   Urine culture     Status: Abnormal   Collection Time: 08/05/16 10:44 PM  Result Value Ref Range Status   Specimen Description CYSTOSCOPY  Final   Special Requests PT ON ANCEF  Final   Culture 400 COLONIES/mL VANCOMYCIN RESISTANT ENTEROCOCCUS (A)  Final   Report Status 08/09/2016 FINAL  Final   Organism ID, Bacteria VANCOMYCIN RESISTANT ENTEROCOCCUS (A)  Final      Susceptibility   Vancomycin resistant enterococcus - MIC*    AMPICILLIN >=32 RESISTANT Resistant     VANCOMYCIN >=32 RESISTANT Resistant     GENTAMICIN SYNERGY SENSITIVE Sensitive     LINEZOLID 2 SENSITIVE Sensitive     * 400 COLONIES/mL VANCOMYCIN RESISTANT ENTEROCOCCUS      Radiology Studies: No results found. Scheduled Meds: . amLODipine  5 mg Oral Daily  . apixaban  5 mg Oral BID  . aspirin EC  325 mg Oral Q breakfast  . atorvastatin  10 mg Oral q1800  . budesonide (PULMICORT) nebulizer solution  1 mg Nebulization BID  . buPROPion  150 mg Oral Daily  . docusate sodium  100 mg Oral BID  . feeding supplement  1 Container Oral TID BM  . gabapentin  300 mg Oral QHS  . hydrALAZINE  25 mg Oral Q8H  . Influenza vac split quadrivalent PF  0.5 mL Intramuscular Tomorrow-1000  . isosorbide mononitrate  30 mg Oral Daily  . metoprolol succinate  50 mg Oral Daily  . pantoprazole  40 mg Oral Daily  . sodium chloride flush  3 mL Intravenous Q12H   Continuous Infusions:   LOS: 7 days   Merlene Laughter, DO [facility] Pager [phone]  If 7PM-7AM, please contact night-coverage [email or web address] Password [facility] 08/09/2016, 6:33 PM

## 2016-08-09 NOTE — Progress Notes (Signed)
Subjective: Doing well.  Pain controlled.     Objective: Vital signs in last 24 hours: Temp:  [97.8 F (36.6 C)-99.5 F (37.5 C)] 97.8 F (36.6 C) (02/02 1303) Pulse Rate:  [79-89] 87 (02/02 1303) Resp:  [17-18] 18 (02/02 1303) BP: (95-139)/(54-81) 117/63 (02/02 1303) SpO2:  [93 %-100 %] 97 % (02/02 1303) Weight:  [231 lb 1.6 oz (104.8 kg)] 231 lb 1.6 oz (104.8 kg) (02/02 0444)  Intake/Output from previous day: 02/01 0701 - 02/02 0700 In: 1650 [P.O.:1650] Out: 1175 [Urine:1175] Intake/Output this shift: Total I/O In: 120 [P.O.:120] Out: 450 [Urine:450]   Recent Labs  08/07/16 0451 08/08/16 0416 08/09/16 0639  HGB 7.9* 8.0* 7.2*    Recent Labs  08/08/16 0416 08/09/16 0639  WBC 13.6* 8.6  RBC 2.70* 2.38*  HCT 24.4* 21.5*  PLT 105* 106*    Recent Labs  08/08/16 0416 08/09/16 0639  NA 133* 135  K 4.4 3.7  CL 110 106  CO2 16* 17*  BUN 63* 61*  CREATININE 2.55* 2.23*  GLUCOSE 97 130*  CALCIUM 7.9* 8.0*   No results for input(s): LABPT, INR in the last 72 hours.  Exam:  Wounds healing well without signs of infection.  NVI.  Calf nontender.    Assessment/Plan: Hx of A-fib.  Restarted eliquis.  Transfer to SNF when cleared by medicine.  Stable from ortho standpoint.     Lisa [name] 08/09/2016, 1:12 PM

## 2016-08-10 LAB — COMPREHENSIVE METABOLIC PANEL
ALBUMIN: 1.7 g/dL — AB (ref 3.5–5.0)
ALT: 27 U/L (ref 14–54)
ANION GAP: 8 (ref 5–15)
AST: 100 U/L — ABNORMAL HIGH (ref 15–41)
Alkaline Phosphatase: 161 U/L — ABNORMAL HIGH (ref 38–126)
BUN: 60 mg/dL — ABNORMAL HIGH (ref 6–20)
CALCIUM: 8.1 mg/dL — AB (ref 8.9–10.3)
CHLORIDE: 110 mmol/L (ref 101–111)
CO2: 18 mmol/L — AB (ref 22–32)
Creatinine, Ser: 2.13 mg/dL — ABNORMAL HIGH (ref 0.44–1.00)
GFR calc non Af Amer: 21 mL/min — ABNORMAL LOW (ref >60)
GFR, EST AFRICAN AMERICAN: 25 mL/min — AB (ref >60)
GLUCOSE: 97 mg/dL (ref 65–99)
POTASSIUM: 4.3 mmol/L (ref 3.5–5.1)
SODIUM: 136 mmol/L (ref 135–145)
Total Bilirubin: 2 mg/dL — ABNORMAL HIGH (ref 0.3–1.2)
Total Protein: 7 g/dL (ref 6.5–8.1)

## 2016-08-10 LAB — TYPE AND SCREEN
BLOOD PRODUCT EXPIRATION DATE: 201802072359
BLOOD PRODUCT EXPIRATION DATE: 201802242359
Blood Product Expiration Date: 201802082359
ISSUE DATE / TIME: 201801301544
UNIT TYPE AND RH: 6200
Unit Type and Rh: 5100
Unit Type and Rh: 6200

## 2016-08-10 LAB — CBC WITH DIFFERENTIAL/PLATELET
Basophils Absolute: 0 10*3/uL (ref 0.0–0.1)
Basophils Relative: 0 %
EOS ABS: 0.1 10*3/uL (ref 0.0–0.7)
EOS PCT: 2 %
HCT: 26.5 % — ABNORMAL LOW (ref 36.0–46.0)
Hemoglobin: 8.6 g/dL — ABNORMAL LOW (ref 12.0–15.0)
LYMPHS ABS: 1.8 10*3/uL (ref 0.7–4.0)
Lymphocytes Relative: 25 %
MCH: 29.8 pg (ref 26.0–34.0)
MCHC: 32.5 g/dL (ref 30.0–36.0)
MCV: 91.7 fL (ref 78.0–100.0)
MONOS PCT: 11 %
Monocytes Absolute: 0.8 10*3/uL (ref 0.1–1.0)
NEUTROS PCT: 62 %
Neutro Abs: 4.5 10*3/uL (ref 1.7–7.7)
PLATELETS: 149 10*3/uL — AB (ref 150–400)
RBC: 2.89 MIL/uL — ABNORMAL LOW (ref 3.87–5.11)
RDW: 18.9 % — ABNORMAL HIGH (ref 11.5–15.5)
WBC: 7.3 10*3/uL (ref 4.0–10.5)

## 2016-08-10 LAB — GLUCOSE, CAPILLARY: GLUCOSE-CAPILLARY: 115 mg/dL — AB (ref 65–99)

## 2016-08-10 LAB — MAGNESIUM: Magnesium: 1.8 mg/dL (ref 1.7–2.4)

## 2016-08-10 LAB — PHOSPHORUS: PHOSPHORUS: 4.1 mg/dL (ref 2.5–4.6)

## 2016-08-10 MED ORDER — APIXABAN 5 MG PO TABS: 5.0000 mg | ORAL_TABLET | Freq: Two times a day (BID) | ORAL | Status: DC

## 2016-08-10 MED ORDER — BOOST / RESOURCE BREEZE PO LIQD: 1.0000 | Freq: Three times a day (TID) | ORAL | 0 refills | Status: DC

## 2016-08-10 MED ORDER — BUDESONIDE 0.5 MG/2ML IN SUSP: 1.0000 mg | Freq: Two times a day (BID) | RESPIRATORY_TRACT | 0 refills | Status: AC

## 2016-08-10 MED ORDER — ASPIRIN 325 MG PO TBEC: 325.0000 mg | DELAYED_RELEASE_TABLET | Freq: Every day | ORAL | 0 refills | Status: DC

## 2016-08-10 MED ORDER — DOCUSATE SODIUM 100 MG PO CAPS: 100.0000 mg | ORAL_CAPSULE | Freq: Two times a day (BID) | ORAL | 0 refills | Status: DC

## 2016-08-10 MED ORDER — POLYETHYLENE GLYCOL 3350 17 G PO PACK
17.0000 g | PACK | Freq: Every day | ORAL | 0 refills | Status: AC | PRN
Start: 2016-08-10 — End: ?

## 2016-08-10 MED ORDER — TRAMADOL HCL 50 MG PO TABS: 50.0000 mg | ORAL_TABLET | Freq: Two times a day (BID) | ORAL | 0 refills | Status: DC | PRN

## 2016-08-10 MED ORDER — APIXABAN 2.5 MG PO TABS: 2.5000 mg | ORAL_TABLET | Freq: Two times a day (BID) | ORAL | 0 refills | Status: DC

## 2016-08-10 NOTE — Progress Notes (Signed)
Placed patient on CPAP for the night with oxygen at 2lpm

## 2016-08-10 NOTE — Progress Notes (Signed)
Bloody urine still noted. Will continue to monitor.

## 2016-08-10 NOTE — Clinical Social Work Note (Signed)
Clinical Social Worker facilitated patient discharge including contacting patient family and facility to confirm patient discharge plans.  Clinical information faxed to facility and family agreeable with plan.  CSW arranged ambulance transport via [facility] to [facility].  RN to call [phone] for report prior to discharge.  Clinical Social Worker will sign off for now as social work intervention is no longer needed. Please consult us again if new need arises.  Marguita Venning [place], LCSWA [phone]   

## 2016-08-10 NOTE — Discharge Summary (Addendum)
Physician Discharge Summary  Lisa [name] ZOX:[id number] DOB: August 04, 1940 DOA: 08/02/2016  PCP: Laurena Slimmer, MD  Admit date: 08/02/2016 Discharge date: 08/10/2016  Admitted From: SNF Disposition:  SNF  Recommendations for Outpatient Follow-up:  1. Follow up with PCP in 1-2 weeks 2. Follow up with Urology as an outpatient for Ureteroscopy in next 6-8 weeks and outpatient UPJ obstruction Evaluation 3. Keep foley in Until patient is more ambulatory 4. Follow up with Orthopedics as an outpatient  5. Outpatient evaluation for Hyperbilirubinemia 6. Please obtain BMP/CBC in one week 7. Please follow up on the following pending results: Urine Culture  Home Health: No Equipment/Devices: None  Discharge Condition: Stable CODE STATUS: FULL CODE Diet recommendation: Heart Healthy   Brief/Interim Summary: 76 year old femalewith medical history significant foratrial fibrillation on Eliquis, hypertension, CAD status post CABG, COPD with chronic respiratory failure, OSA, and chronic diastolic CHF who presents to the emergency department from her nursing facility for evaluation of severe left leg pain following a fall. Patient was evaluated in the emergency department. Found to have a left supracondylar fracture of the femur along with an oblique fracture involving the distal diaphysis of the left fibula. Patient was also noted to be anemic. Hospitalized for further management. Patient was seen by orthopedics. Patient was high risk for perioperative mortality. She was seen by cardiology. Patient also found to have left-sided hydronephrosis for which patient was seen by urology. Patient underwent surgical intervention and is POD 5 of External Fixation, ORIF of Femur and Cystoscopy with Retrograde Pyelogram/Ureteral Stent Placement. She had a rapid response called on the Night of 08/07/2016 because of lethargy but she is now improved and at baseline. Hb/Hct has been stable and Cr is trending down. She did  grow 400 colonies of VRE but is asymptomatic. If patient becomes symptomatic preferred treatment for UTI is Linezolid. Her anticoagulation has been restarted by Orthopedics. At this time she is medically stable to be transferred back to SNF and follow up with PCP, Urology, and Orthopedic Surgery as an outpatient.  Discharge Diagnoses:  Principal Problem:   Femur fracture, left (HCC) Active Problems:   GERD (gastroesophageal reflux disease)   Hypertension   Chronic diastolic heart failure (HCC)   OSA (obstructive sleep apnea)   Chronic respiratory failure with hypoxia (HCC)   Pressure sore   Anemia, normocytic normochromic   CAD (coronary artery disease)   Fibula fracture   Coagulopathy (HCC)   Hypertensive heart disease  Left femur fracture, left fibula fracture  -This was as a result of a mechanical fall.  -Orthopedic surgery has seen the patient. Anticoagulation was discontinued for surgery.  -Due to patient's complicated cardiac history, cardiology was consulted to provide a preoperative assessment.  -Patient is high-risk for perioperative mortality, with risk of perioperative cardiac complications 3-4% per Amie Critchley.. Patient is status post ORIF POD 5.  -Per Orthopedics -Continue to Monitor -WBC elevated posteroperatively and now improved as it went from 13.6 -> 7.3; Likely reactive; -Continue to monitor for S/Sx of Infection and trending down -Repeat CBC at SNF -D/C to SNF and has bed at [facility]  AKI superimposed on CKD stage III with hyperkalemia/left-sided hydronephrosis -Patient's baseline appears to be between 1.5 and 2.0.  -Creatinine was 3.1 on admission.  -Patient appeared to be intravascularly depleted. She was given IV fluids. Renal function appears to have plateaued.  -Evaluation has revealed left-sided hydronephrosis with concern for obstruction at UPJ. This is likely contributing to persistent elevation in creatinine. Urology has seen the  patient  and underwent stent placement. Urine output is present but low. Continue to monitor for now. -Per Urology will need a Ureteroscopy in 6-8 weeks -Urine Cx from OR showed 400 Colonies of Enterococcus Faecium and was VRE; Likely not significant as does not seem to have Infection and will repeat Urinalysis and Urine Cx. Patient is asymptomatic with no fever or elevated WBC; If becomes Symptomatic will need to treat with Linezolid.   -Urology Recommends keeping foley until she is more ambulatory -BUN/Cr slightly improved from 63/2.55 -> 61/2.23 -> 60/2.13 -Encouraged po Hydration -Repeat CMP as an outpatient  Lethargy/Encephalopathy, resolved -Improved and mentation was well this AM -RRT was called on the night of 08/07/16 because she was more lethargic -ABG wnL and patient saturating Fine -Patient did not wear CPAP that night -Hold Sedating Medications; Possibly from Pain Control with Morphine and Oxycodone IR -Continue to Monitor  Normocytic anemia/Thrombocytopenia  -Hgb is 7.2 on admission; recent priors have been in 7-9 range.  -Hemoglobin did drop to 6.7 on 1/27. She was transfused 2 units of blood. Hemoglobin improved. -Postoperatively, it has dropped again. She was transfused an additional 1 unit of PRBC's because of Antibodies -Hb/Hct went from 7.9/24.4 -> 8.0/24.4 -> 7.2/21.5 -> 8.6/26.5 -Platelet Count went from 113 -> 82 -> 105 -> 106 -> 149 -Apixaban restarted by Orthopedics; Now on Renal Dosing -Follow up CBC as an outpatient  Paroxysmal Atrial Fibrillation -Appears to be in atrial fibrillation.  -CHADS-VASc at least 72 (age x2, gender, CHF, HTN).  -She was anticoagulated with Eliquis; this was held on admission in preparation for surgery. -Continue metoprolol as tolerated.  -Eliquis restarted by Orthopedics; Now on Renal Dosing  Chronic diastolic CHF  -Patient appeared to be intravascularly depleted on admission.  -Gentle IV hydration now D/C'd  -TTE (05/05/16) with EF  50-55%, moderate LVH, grade 1 diastolic dysfunction, moderate LAE, mild-mod TR, and moderate increase in PA pressures. -Lasix held on admission in light of AKI and apparent dehydration.  -Strict ins and outs and daily weights.  CAD -No anginal complaints on admission.  -ASA 325 mg po daily -Pt is s/p CABG and follows with cardiology in outpatient setting.  -Cardiology has seen the patient. No testing ordered at this time.  Hyperbilirubinemia, coagulopathy -Still elevated at 2.0 -Reason for hyperbilirubinemia is not entirely clear.  -Bilirubin was fractionated and appears to be equal, direct and indirect. Ultrasound suggests fatty infiltration of the liver.  -Alkaline phosphatase is normal now. CBD is nondistended. Coagulopathy could have been due to anticoagulation. -Repeat CMP in AM  COPD, OSA, chronic respiratory failure -Patient uses 2 Lpm supplemental oxygen at baseline.  -Respiratory status appears to be stable on admission.  -Continue supplemental O2, CPAP qHS.  Essential Hypertension -Continue Toprol, Norvasc, and hydralazine as tolerated   Discharge Instructions  Discharge Instructions    Call MD for:  difficulty breathing, headache or visual disturbances    Complete by:  As directed    Call MD for:  extreme fatigue    Complete by:  As directed    Call MD for:  hives    Complete by:  As directed    Call MD for:  persistant dizziness or light-headedness    Complete by:  As directed    Call MD for:  persistant nausea and vomiting    Complete by:  As directed    Call MD for:  severe uncontrolled pain    Complete by:  As directed    Call MD for:  temperature >100.4    Complete by:  As directed    Diet - low sodium heart healthy    Complete by:  As directed    Discharge instructions    Complete by:  As directed    Follow up Care at SNF   Increase activity slowly    Complete by:  As directed      Allergies as of 08/10/2016   No Known Allergies      Medication List    STOP taking these medications   furosemide 80 MG tablet Commonly known as:  LASIX   gabapentin 600 MG tablet Commonly known as:  NEURONTIN   metolazone 2.5 MG tablet Commonly known as:  ZAROXOLYN   mirtazapine 7.5 MG tablet Commonly known as:  REMERON     TAKE these medications   amLODipine 5 MG tablet Commonly known as:  NORVASC Take 1 tablet (5 mg total) by mouth daily.   apixaban 2.5 MG Tabs tablet Commonly known as:  ELIQUIS Take 1 tablet (2.5 mg total) by mouth 2 (two) times daily. What changed:  medication strength  how much to take   aspirin 325 MG EC tablet Take 1 tablet (325 mg total) by mouth daily with breakfast. Start taking on:  08/11/2016   atorvastatin 10 MG tablet Commonly known as:  LIPITOR Take 1 tablet (10 mg total) by mouth daily at 6 PM.   budesonide 0.5 MG/2ML nebulizer solution Commonly known as:  PULMICORT Take 4 mLs (1 mg total) by nebulization 2 (two) times daily.   buPROPion 150 MG 24 hr tablet Commonly known as:  WELLBUTRIN XL Take 150 mg by mouth daily. Reported on 08/02/2015   docusate sodium 100 MG capsule Commonly known as:  COLACE Take 1 capsule (100 mg total) by mouth 2 (two) times daily.   feeding supplement Liqd Take 1 Container by mouth 3 (three) times daily between meals.   fluticasone 220 MCG/ACT inhaler Commonly known as:  FLOVENT HFA Inhale 2 puffs into the lungs 2 (two) times daily as needed (shortness of breath/ wheezing).   hydrALAZINE 25 MG tablet Commonly known as:  APRESOLINE Take 1 tablet (25 mg total) by mouth every 8 (eight) hours.   HYDROcodone-acetaminophen 5-325 MG tablet Commonly known as:  NORCO/VICODIN Take 1-2 tablets by mouth every 6 (six) hours as needed for moderate pain.   ipratropium-albuterol 0.5-2.5 (3) MG/3ML Soln Commonly known as:  DUONEB Take 3 mLs by nebulization every 6 (six) hours as needed. Wheezing or shortness of breath. What changed:  reasons to take  this  additional instructions   isosorbide mononitrate 30 MG 24 hr tablet Commonly known as:  IMDUR Take 1 tablet (30 mg total) by mouth daily.   metoprolol succinate 50 MG 24 hr tablet Commonly known as:  TOPROL-XL Take 1 tablet (50 mg total) by mouth daily. Take with or immediately following a meal.   omeprazole 20 MG capsule Commonly known as:  PRILOSEC Take 20 mg by mouth daily.   OXYGEN Inhale 2 L into the lungs continuous.   polyethylene glycol packet Commonly known as:  MIRALAX / GLYCOLAX Take 17 g by mouth daily as needed for mild constipation.   potassium chloride 10 MEQ tablet Commonly known as:  K-DUR,KLOR-CON Take 20 mEq by mouth 2 (two) times daily.   PROMETHEGAN 25 MG suppository Generic drug:  promethazine Place 25 mg rectally daily as needed for nausea or vomiting.   traMADol 50 MG tablet Commonly known as:  ULTRAM Take 1  tablet (50 mg total) by mouth every 12 (twelve) hours as needed (pain).      Follow-up Information    ESKRIDGE, MATTHEW, MD Follow up.   Specialty:  Urology Why:  2-3 weeks  Contact information: [address] [place] [address] [phone]        Eldred MangesMark C Yates, MD. Schedule an appointment as soon as possible for a visit.   Specialty:  Orthopedic Surgery Why:  NEEDS RETURN OFFICE VISIT 2-3 WEEKS POSTOP.   Contact information: [address] [place] [address] [phone]          No Known Allergies  Consultations:  Cardiology  Orthopedic Surgery  Urology  Procedures/Studies: Dg Chest 1 View  Result Date: 08/02/2016 CLINICAL DATA:  Pain after fall.  Preop. EXAM: CHEST 1 VIEW COMPARISON:  06/07/2016 and 05/04/2016 CXR FINDINGS: Low lung volumes as before. Status post CABG. Scarring in the right mid lung and left upper lobe. No pneumonic consolidation, effusion or pneumothorax. Osteoarthritis both glenohumeral joints. No acute fracture identified of the included ribs. IMPRESSION: No active disease.   Chronic right mid and left upper lobe scarring Electronically Signed   By: Tollie Ethavid  Kwon M.D.   On: 08/02/2016 21:30   Dg Pelvis 1-2 Views  Result Date: 08/02/2016 CLINICAL DATA:  Pain after fall EXAM: PELVIS - 1-2 VIEW COMPARISON:  None. FINDINGS: There is no evidence of pelvic fracture or diastasis. Hip joints are maintained bilaterally. The included lumbar spine demonstrates lumbar spinal fusion hardware at L4 and L5. No pelvic bone lesions are seen. IMPRESSION: No acute pelvic fracture. No hip dislocation. No proximal femoral fracture is noted. Partially included lumbar fixation hardware noted along the lower lumbar spine to L5. Electronically Signed   By: Tollie Ethavid  Kwon M.D.   On: 08/02/2016 21:26   Dg Knee 1-2 Views Left  Result Date: 08/05/2016 CLINICAL DATA:  Internal fixation of left femoral fracture. Initial encounter. EXAM: LEFT KNEE - 1-2 VIEW COMPARISON:  Left femur radiographs performed 08/02/2016 FINDINGS: Seven fluoroscopic C-arm images are provided from the OR, demonstrating placement of a plate and screws across the mid to distal femur, transfixing the comminuted distal femoral fracture with mild residual medial displacement and anterior angulation. No new fractures are seen. The patient's total knee arthroplasty is grossly unremarkable in appearance. IMPRESSION: Status post internal fixation of distal left femur, with mild residual medial displacement and anterior angulation. Electronically Signed   By: Roanna RaiderJeffery  Chang M.D.   On: 08/05/2016 23:58   Dg Tibia/fibula Left  Result Date: 08/02/2016 CLINICAL DATA:  Left leg pain after fall EXAM: LEFT TIBIA AND FIBULA - 2 VIEW COMPARISON:  None. FINDINGS: There is a subtle oblique fracture of the distal diaphysis of the fibula just above the tibiotalar joint without definite extension into the ankle joint. Minimally widened appearance of the lateral tibiotalar joint in the craniocaudad axis to 6 mm. Supracondylar fracture of the femur is excluded  on this study. The patient is status post total knee arthroplasty. There is arteriosclerosis along the tibial arteries. IMPRESSION: Subtle oblique closed fracture of the distal diaphysis of the fibula which appears to spare the ankle joint. Minimal widening along the lateral aspect of the tibiotalar joint in the craniocaudad axis up to 6 mm. Electronically Signed   By: Tollie Ethavid  Kwon M.D.   On: 08/02/2016 21:19   [place] Abdomen Complete  Result Date: 08/03/2016 CLINICAL DATA:  76 year old female with acute renal failure. History of hypertension and diabetes. EXAM: ABDOMEN ULTRASOUND COMPLETE  COMPARISON:  Abdominal ultrasound dated 05/07/2016 and CT dated 05/05/2016 FINDINGS: Evaluation is limited due to patient's body habitus and portable technique. Gallbladder: Cholecystectomy. Common bile duct: Diameter: 6 mm. Liver: Apparent increased hepatic echogenicity may represent fatty infiltration. There liver is poorly visualized. IVC: No abnormality visualized. Pancreas: Not visualized. Spleen: Size and appearance within normal limits. Right Kidney: Length: 10.5 Cm. The kidney demonstrates increased echotexture compatible with underlying chronic kidney disease. There is no hydronephrosis or echogenic stone. There is a 4.8 x 4.5 x 4.5 cm right renal cyst. Left Kidney: Length: 11.5 cm. There is increased left renal echogenicity. There is moderate left hydronephrosis with parenchymal atrophy. No definite echogenic stone identified. Abdominal aorta: No aneurysm visualized. Other findings: None. IMPRESSION: Increased renal echotexture likely related to underlying medical renal disease. There is moderate left hydronephrosis. Cholecystectomy. Electronically Signed   By: Elgie Collard M.D.   On: 08/03/2016 07:20   Dg Knee Complete 4 Views Left  Result Date: 08/02/2016 CLINICAL DATA:  Acute left knee pain after fall EXAM: LEFT KNEE - COMPLETE 4+ VIEW COMPARISON:  03/31/2014 FINDINGS: Acute, closed, comminuted supracondylar  fracture of the left distal femoral diaphysis. There is dorsal displacement of the femoral condyles one shaft width and lateral displaced 1/2 shaft width relative to the femoral shaft. Lipohemarthrosis is noted. The patient is status post left knee arthroplasty. The fracture appears to spare the femoral condylar components. No loosening nor involvement of the tibial tray nor patella. No dislocation at the knee joint. Femoral and popliteal arteriosclerosis. IMPRESSION: Acute, closed, comminuted supracondylar fracture of the distal left femoral diaphysis sparing left knee arthroplasty components with dorsolateral displacement of the femoral condyles. Lipohemarthrosis. Electronically Signed   By: Tollie Eth M.D.   On: 08/02/2016 21:14   Dg C-arm 61-120 Min  Result Date: 08/05/2016 CLINICAL DATA:  Internal fixation of left femoral fracture. Initial encounter. EXAM: LEFT KNEE - 1-2 VIEW COMPARISON:  Left femur radiographs performed 08/02/2016 FINDINGS: Seven fluoroscopic C-arm images are provided from the OR, demonstrating placement of a plate and screws across the mid to distal femur, transfixing the comminuted distal femoral fracture with mild residual medial displacement and anterior angulation. No new fractures are seen. The patient's total knee arthroplasty is grossly unremarkable in appearance. IMPRESSION: Status post internal fixation of distal left femur, with mild residual medial displacement and anterior angulation. Electronically Signed   By: Roanna Raider M.D.   On: 08/05/2016 23:58   Ct Renal Stone Study  Result Date: 08/04/2016 CLINICAL DATA:  Hydronephrosis. EXAM: CT ABDOMEN AND PELVIS WITHOUT CONTRAST TECHNIQUE: Multidetector CT imaging of the abdomen and pelvis was performed following the standard protocol without IV contrast. COMPARISON:  Abdominal ultrasound 08/03/2016, CT of the abdomen pelvis 05/05/2016 FINDINGS: Lower chest: No acute abnormality. Hepatobiliary: No focal liver abnormality  is seen. Status post cholecystectomy. No biliary dilatation. Pancreas: Unremarkable. No pancreatic ductal dilatation or surrounding inflammatory changes. Spleen: Normal in size without focal abnormality. Adrenals/Urinary Tract: Bilateral adrenal glands are normal. Mild right renal cortical thinning. Two right renal cysts, the larger of which in the lower pole is partially exophytic and measures 4.6 cm. There is a marked left hydronephrosis without evidence of left hydroureter. No obstructive calculi es seen. Reactive perirenal and periureteral fat stranding is seen. Stomach/Bowel: Stomach is within normal limits. No evidence of appendicitis. No evidence of bowel wall thickening, distention, or inflammatory changes. Vascular/Lymphatic: Aortic atherosclerosis. No enlarged abdominal or pelvic lymph nodes. Reproductive: Status post hysterectomy. No adnexal masses. Other: Small fat  containing periumbilical hernia.  Mild anasarca. Musculoskeletal: Lumbosacral spine laminectomy and fusion noted. S shaped thoraco lumbar spine scoliosis with advanced osteoarthritic changes. No evidence of fracture or suspicious osseous lesions. IMPRESSION: Marked left hydronephrosis without evidence of left hydroureter. No obstructive nephrolithiasis is seen. Given these findings obstructive mass at the left UPJ becomes of primarily consideration. The lack of IV contrast precludes proper evaluation of the renal parenchyma and left ureter. Electronically Signed   By: Ted Mcalpine M.D.   On: 08/04/2016 10:37   Dg Femur Min 2 Views Left  Result Date: 08/02/2016 CLINICAL DATA:  Pain after fall EXAM: LEFT FEMUR 2 VIEWS COMPARISON:  None. FINDINGS: No acute fracture about the left hip. The left hip joint is maintained. Left superior and inferior pubic rami appear intact. Acute, closed, comminuted supracondylar fracture of the distal femoral diaphysis sparing the femoral condylar component of a total knee arthroplasty. Small 4.1 x 1.7 x  1.5 cm butterfly fragment is noted dorsolaterally. There is dorsal and lateral displacement of the femoral condyles relative to the femoral shaft by 1/2 shaft width in both directions on these images. IMPRESSION: Acute, closed, comminuted supracondylar fracture of the left distal femoral diaphysis with 1/2 shaft with lateral and dorsal displacement of the femoral condyles relative to the femoral shaft. Electronically Signed   By: Tollie Eth M.D.   On: 08/02/2016 21:23     Subjective: Seen and examined at bedside and was doing well but complained of Left Leg Pain. No N/V. Foley draining Bloody color urine. No complaints or burning.   Discharge Exam: Vitals:   08/10/16 0550 08/10/16 1332  BP: 132/78 (!) 118/53  Pulse: 87 85  Resp: 18 17  Temp: 98 F (36.7 C) 98.4 F (36.9 C)   Vitals:   08/10/16 0032 08/10/16 0550 08/10/16 0945 08/10/16 1332  BP:  132/78  (!) 118/53  Pulse: 77 87  85  Resp: 18 18  17   Temp:  98 F (36.7 C)  98.4 F (36.9 C)  TempSrc:  Oral  Oral  SpO2: 95% 98% 96% 100%  Weight:  105.2 kg (232 lb)    Height:       General: Pt is alert, awake, not in acute distress Cardiovascular: RRR, S1/S2 +, no rubs, no gallops Respiratory: CTA bilaterally, no wheezing, no rhonchi Abdominal: Soft, NT, ND, bowel sounds + Extremities: Mild edema, no cyanosis  The results of significant diagnostics from this hospitalization (including imaging, microbiology, ancillary and laboratory) are listed below for reference.    Microbiology: Recent Results (from the past 240 hour(s))  MRSA PCR Screening     Status: None   Collection Time: 08/03/16  4:43 AM  Result Value Ref Range Status   MRSA by PCR NEGATIVE NEGATIVE Final    Comment:        The GeneXpert MRSA Assay (FDA approved for NASAL specimens only), is one component of a comprehensive MRSA colonization surveillance program. It is not intended to diagnose MRSA infection nor to guide or monitor treatment for MRSA  infections.   Urine culture     Status: Abnormal   Collection Time: 08/05/16 10:44 PM  Result Value Ref Range Status   Specimen Description CYSTOSCOPY  Final   Special Requests PT ON ANCEF  Final   Culture 400 COLONIES/mL VANCOMYCIN RESISTANT ENTEROCOCCUS (A)  Final   Report Status 08/09/2016 FINAL  Final   Organism ID, Bacteria VANCOMYCIN RESISTANT ENTEROCOCCUS (A)  Final      Susceptibility   Vancomycin  resistant enterococcus - MIC*    AMPICILLIN >=32 RESISTANT Resistant     VANCOMYCIN >=32 RESISTANT Resistant     GENTAMICIN SYNERGY SENSITIVE Sensitive     LINEZOLID 2 SENSITIVE Sensitive     * 400 COLONIES/mL VANCOMYCIN RESISTANT ENTEROCOCCUS    Labs: BNP (last 3 results)  Recent Labs  05/03/16 1859 06/02/16 1914 08/02/16 2352  BNP 257.2* 757.9* 500.3*   Basic Metabolic Panel:  Recent Labs Lab 08/06/16 0803 08/07/16 0451 08/08/16 0416 08/09/16 0639 08/10/16 0556  NA 137 132* 133* 135 136  K 4.9 4.2 4.4 3.7 4.3  CL 111 110 110 106 110  CO2 18* 16* 16* 17* 18*  GLUCOSE 88 158* 97 130* 97  BUN 62* 60* 63* 61* 60*  CREATININE 2.71* 2.64* 2.55* 2.23* 2.13*  CALCIUM 7.9* 7.7* 7.9* 8.0* 8.1*  MG  --   --  1.8 1.8 1.8  PHOS  --   --  5.4* 4.4 4.1   Liver Function Tests:  Recent Labs Lab 08/04/16 0457 08/04/16 1404 08/06/16 0803 08/08/16 0416 08/09/16 0639 08/10/16 0556  AST 53*  --  36 30 61* 100*  ALT 35  --  20 9* 16 27  ALKPHOS 152*  --  108 115 140* 161*  BILITOT 2.6* 2.4* 1.9* 1.9* 1.8* 2.0*  PROT 6.5  --  5.8* 6.5 6.3* 7.0  ALBUMIN 1.7*  --  1.5* 1.6* 1.4* 1.7*   No results for input(s): LIPASE, AMYLASE in the last 168 hours. No results for input(s): AMMONIA in the last 168 hours. CBC:  Recent Labs Lab 08/06/16 0803 08/07/16 0451 08/08/16 0416 08/09/16 0639 08/10/16 0556  WBC 14.3* 14.3* 13.6* 8.6 7.3  NEUTROABS  --   --  10.4* 6.1 4.5  HGB 6.9* 7.9* 8.0* 7.2* 8.6*  HCT 21.3* 24.4* 24.4* 21.5* 26.5*  MCV 93.0 89.7 90.4 90.3 91.7  PLT  113* 82* 105* 106* 149*   Cardiac Enzymes: No results for input(s): CKTOTAL, CKMB, CKMBINDEX, TROPONINI in the last 168 hours. BNP: Invalid input(s): POCBNP CBG:  Recent Labs Lab 08/07/16 0801 08/07/16 1823 08/08/16 0756 08/09/16 0748 08/10/16 0841  GLUCAP 214* 108* 124* 123* 115*   D-Dimer No results for input(s): DDIMER in the last 72 hours. Hgb A1c No results for input(s): HGBA1C in the last 72 hours. Lipid Profile No results for input(s): CHOL, HDL, LDLCALC, TRIG, CHOLHDL, LDLDIRECT in the last 72 hours. Thyroid function studies No results for input(s): TSH, T4TOTAL, T3FREE, THYROIDAB in the last 72 hours.  Invalid input(s): FREET3 Anemia work up No results for input(s): VITAMINB12, FOLATE, FERRITIN, TIBC, IRON, RETICCTPCT in the last 72 hours. Urinalysis    Component Value Date/Time   COLORURINE RED (A) 08/09/2016 2113   APPEARANCEUR TURBID (A) 08/09/2016 2113   LABSPEC 1.020 08/09/2016 2113   PHURINE 5.0 08/09/2016 2113   GLUCOSEU NEGATIVE 08/09/2016 2113   HGBUR LARGE (A) 08/09/2016 2113   BILIRUBINUR MODERATE (A) 08/09/2016 2113   KETONESUR 15 (A) 08/09/2016 2113   PROTEINUR >300 (A) 08/09/2016 2113   UROBILINOGEN 1.0 08/20/2014 1130   NITRITE POSITIVE (A) 08/09/2016 2113   LEUKOCYTESUR LARGE (A) 08/09/2016 2113   Sepsis Labs Invalid input(s): PROCALCITONIN,  WBC,  LACTICIDVEN Microbiology Recent Results (from the past 240 hour(s))  MRSA PCR Screening     Status: None   Collection Time: 08/03/16  4:43 AM  Result Value Ref Range Status   MRSA by PCR NEGATIVE NEGATIVE Final    Comment:  The GeneXpert MRSA Assay (FDA approved for NASAL specimens only), is one component of a comprehensive MRSA colonization surveillance program. It is not intended to diagnose MRSA infection nor to guide or monitor treatment for MRSA infections.   Urine culture     Status: Abnormal   Collection Time: 08/05/16 10:44 PM  Result Value Ref Range Status   Specimen  Description CYSTOSCOPY  Final   Special Requests PT ON ANCEF  Final   Culture 400 COLONIES/mL VANCOMYCIN RESISTANT ENTEROCOCCUS (A)  Final   Report Status 08/09/2016 FINAL  Final   Organism ID, Bacteria VANCOMYCIN RESISTANT ENTEROCOCCUS (A)  Final      Susceptibility   Vancomycin resistant enterococcus - MIC*    AMPICILLIN >=32 RESISTANT Resistant     VANCOMYCIN >=32 RESISTANT Resistant     GENTAMICIN SYNERGY SENSITIVE Sensitive     LINEZOLID 2 SENSITIVE Sensitive     * 400 COLONIES/mL VANCOMYCIN RESISTANT ENTEROCOCCUS   Time coordinating discharge: Over 30 minutes  SIGNED:  Merlene Laughter, DO [facility] 08/10/2016, 2:43 PM Pager [phone]  If 7PM-7AM, please contact night-coverage [email or web address] Password [email or web address]

## 2016-08-10 NOTE — Progress Notes (Signed)
[facility] in stable condition. Discharged with foley catheter as ordered by MD. Report called and given to receiving facility nurse with no concerns voiced

## 2016-08-12 ENCOUNTER — Other Ambulatory Visit: Payer: Self-pay | Admitting: *Deleted

## 2016-08-12 LAB — URINE CULTURE

## 2016-08-12 NOTE — Patient Outreach (Signed)
[facility] Care Management  08/12/2016  Lisa [name] 05-20-1941 [id number]   Noted that member was discharged over the weekend back to rehab/SNF.  LCSW notified, will follow up with initiation of transition of care program once member discharged.  Kemper DurieMonica Bartow Zylstra, [place]RN, MSN [facility] Care [facility]  [facility] Care Manager [phone]

## 2016-08-16 ENCOUNTER — Ambulatory Visit: Payer: Self-pay | Admitting: *Deleted

## 2016-08-19 ENCOUNTER — Other Ambulatory Visit: Payer: Self-pay | Admitting: *Deleted

## 2016-08-19 NOTE — Patient Outreach (Signed)
[facility]) Care Management  [facility]  08/19/2016  Lisa [name] [birth year] [id number]  Subjective:  [facility].  Patient tearful and in visible pain due to fractured femur. Patient's daughter's at bedside, stating that they had planned [place] one of her daughter resides, however feel at this time patient would not be able to travel due to her health condition.  They are willing to consider long term care locally but have made no definite plans.  Patient's daughter concerned that if patient returns home, she will not have a caregiver throughout the night. Per patient's daughter , patient has a wound on her left lef that does not seem to be healing well. Wound care doctor scheduled to see patient on Thursday of this week.    Objective:   Encounter Medications:  Outpatient Encounter Prescriptions as of 08/19/2016  Medication Sig Note  . amLODipine (NORVASC) 5 MG tablet Take 1 tablet (5 mg total) by mouth daily.   Marland Kitchen. apixaban (ELIQUIS) 2.5 MG TABS tablet Take 1 tablet (2.5 mg total) by mouth 2 (two) times daily.   Marland Kitchen. aspirin EC 325 MG EC tablet Take 1 tablet (325 mg total) by mouth daily with breakfast.   . atorvastatin (LIPITOR) 10 MG tablet Take 1 tablet (10 mg total) by mouth daily at 6 PM.   . budesonide (PULMICORT) 0.5 MG/2ML nebulizer solution Take 4 mLs (1 mg total) by nebulization 2 (two) times daily.   Marland Kitchen. buPROPion (WELLBUTRIN XL) 150 MG 24 hr tablet Take 150 mg by mouth daily. Reported on 08/02/2015   . docusate sodium (COLACE) 100 MG capsule Take 1 capsule (100 mg total) by mouth 2 (two) times daily.   . feeding supplement (BOOST / RESOURCE BREEZE) LIQD Take 1 Container by mouth 3 (three) times daily between meals.   . fluticasone (FLOVENT HFA) 220 MCG/ACT inhaler Inhale 2 puffs into the lungs 2 (two) times daily as needed (shortness of breath/ wheezing).   . hydrALAZINE (APRESOLINE) 25  MG tablet Take 1 tablet (25 mg total) by mouth every 8 (eight) hours.   Marland Kitchen. HYDROcodone-acetaminophen (NORCO/VICODIN) 5-325 MG tablet Take 1-2 tablets by mouth every 6 (six) hours as needed for moderate pain.   Marland Kitchen. ipratropium-albuterol (DUONEB) 0.5-2.5 (3) MG/3ML SOLN Take 3 mLs by nebulization every 6 (six) hours as needed. Wheezing or shortness of breath. (Patient taking differently: Take 3 mLs by nebulization every 6 (six) hours as needed (shortness of breath/ wheezing). )   . isosorbide mononitrate (IMDUR) 30 MG 24 hr tablet Take 1 tablet (30 mg total) by mouth daily. (Patient not taking: Reported on 08/03/2016)   . metoprolol succinate (TOPROL-XL) 50 MG 24 hr tablet Take 1 tablet (50 mg total) by mouth daily. Take with or immediately following a meal. 08/03/2016: Family not sure if this is taken in the morning or at night - will bring more information tomorrow  . omeprazole (PRILOSEC) 20 MG capsule Take 20 mg by mouth daily.   . OXYGEN Inhale 2 L into the lungs continuous.   . polyethylene glycol (MIRALAX / GLYCOLAX) packet Take 17 g by mouth daily as needed for mild constipation.   . potassium chloride (K-DUR,KLOR-CON) 10 MEQ tablet Take 20 mEq by mouth 2 (two) times daily.   Marland Kitchen. PROMETHEGAN 25 MG suppository Place 25 mg rectally daily as needed for nausea or vomiting.    . traMADol (ULTRAM) 50 MG tablet Take 1  tablet (50 mg total) by mouth every 12 (twelve) hours as needed (pain).    No facility-administered encounter medications on file as of 08/19/2016.     Functional Status:  In your present state of health, do you have any difficulty performing the following activities: 08/03/2016 06/17/2016  Hearing? N N  Vision? N N  Difficulty concentrating or making decisions? Y N  Walking or climbing stairs? Y Y  Dressing or bathing? Y Y  Doing errands, shopping? Malvin Johns  Preparing Food and eating ? - Y  Using the Toilet? - Y  In the past six months, have you accidently leaked urine? - Y  Do you have  problems with loss of bowel control? - Y  Managing your Medications? - N  Managing your Finances? - N  Housekeeping or managing your Housekeeping? - N  Some recent data might be hidden    Fall/Depression Screening:  PHQ 2/9 Scores 06/17/2016 10/26/2015  PHQ - 2 Score 1 6  PHQ- 9 Score - 16    Assessment:  This socia l worker spoke with discharge planner Marcille Buffy who states that patient's daughter's would like patient to move back to a SNF in [place] where her youngest daughter resides.  Travel at this time not recommended due to patient's fragile medical condition. However the discharge planner will assist patient's family with the requested list of SNF's in [place] for their review. She will also assist with long term care locally if family agrees.  Patient in visible pain, not able to discuss long term plan at this time.  However patient's daughter's are considering their options.  They are very concerned about the wound on patient's left leg that does not seem to be getting better as well as her pain medication schedule.  This Child psychotherapist encouraged patient's daughter's to discuss concerns with treatment staff at the facility.  Plan:  This Child psychotherapist will follow up patient's progress while in the SNF to provide discharge planning support.   Adriana Reams [facility] Care Management [phone]

## 2016-08-24 ENCOUNTER — Observation Stay (HOSPITAL_COMMUNITY)
Admission: EM | Admit: 2016-08-24 | Discharge: 2016-08-25 | Disposition: A | Discharge status: Skilled Nursing Facility | Payer: Medicare Other | Attending: Family Medicine | Admitting: Family Medicine

## 2016-08-24 ENCOUNTER — Encounter (HOSPITAL_COMMUNITY): Payer: Self-pay

## 2016-08-24 DIAGNOSIS — L89893 Pressure ulcer of other site, stage 3: Secondary | ICD-10-CM | POA: Diagnosis not present

## 2016-08-24 DIAGNOSIS — E119 Type 2 diabetes mellitus without complications: Secondary | ICD-10-CM | POA: Insufficient documentation

## 2016-08-24 DIAGNOSIS — I110 Hypertensive heart disease with heart failure: Secondary | ICD-10-CM | POA: Insufficient documentation

## 2016-08-24 DIAGNOSIS — I5032 Chronic diastolic (congestive) heart failure: Secondary | ICD-10-CM | POA: Diagnosis not present

## 2016-08-24 DIAGNOSIS — J45909 Unspecified asthma, uncomplicated: Secondary | ICD-10-CM | POA: Insufficient documentation

## 2016-08-24 DIAGNOSIS — Z7982 Long term (current) use of aspirin: Secondary | ICD-10-CM | POA: Diagnosis not present

## 2016-08-24 DIAGNOSIS — Z7951 Long term (current) use of inhaled steroids: Secondary | ICD-10-CM | POA: Insufficient documentation

## 2016-08-24 DIAGNOSIS — F39 Unspecified mood [affective] disorder: Secondary | ICD-10-CM | POA: Insufficient documentation

## 2016-08-24 DIAGNOSIS — D649 Anemia, unspecified: Secondary | ICD-10-CM | POA: Diagnosis present

## 2016-08-24 DIAGNOSIS — Z6839 Body mass index (BMI) 39.0-39.9, adult: Secondary | ICD-10-CM | POA: Insufficient documentation

## 2016-08-24 DIAGNOSIS — G4733 Obstructive sleep apnea (adult) (pediatric): Secondary | ICD-10-CM | POA: Diagnosis not present

## 2016-08-24 DIAGNOSIS — Z87891 Personal history of nicotine dependence: Secondary | ICD-10-CM | POA: Diagnosis not present

## 2016-08-24 DIAGNOSIS — Z79899 Other long term (current) drug therapy: Secondary | ICD-10-CM | POA: Insufficient documentation

## 2016-08-24 DIAGNOSIS — Z7901 Long term (current) use of anticoagulants: Secondary | ICD-10-CM

## 2016-08-24 DIAGNOSIS — E785 Hyperlipidemia, unspecified: Secondary | ICD-10-CM | POA: Insufficient documentation

## 2016-08-24 DIAGNOSIS — Z951 Presence of aortocoronary bypass graft: Secondary | ICD-10-CM | POA: Insufficient documentation

## 2016-08-24 DIAGNOSIS — K219 Gastro-esophageal reflux disease without esophagitis: Secondary | ICD-10-CM | POA: Insufficient documentation

## 2016-08-24 DIAGNOSIS — Z955 Presence of coronary angioplasty implant and graft: Secondary | ICD-10-CM | POA: Diagnosis not present

## 2016-08-24 DIAGNOSIS — I2510 Atherosclerotic heart disease of native coronary artery without angina pectoris: Secondary | ICD-10-CM | POA: Diagnosis not present

## 2016-08-24 DIAGNOSIS — I482 Chronic atrial fibrillation, unspecified: Secondary | ICD-10-CM | POA: Diagnosis present

## 2016-08-24 LAB — CBC
HCT: 23 % — ABNORMAL LOW (ref 36.0–46.0)
Hemoglobin: 7.2 g/dL — ABNORMAL LOW (ref 12.0–15.0)
MCH: 31.2 pg (ref 26.0–34.0)
MCHC: 31.3 g/dL (ref 30.0–36.0)
MCV: 99.6 fL (ref 78.0–100.0)
PLATELETS: 147 10*3/uL — AB (ref 150–400)
RBC: 2.31 MIL/uL — ABNORMAL LOW (ref 3.87–5.11)
RDW: 21 % — AB (ref 11.5–15.5)
WBC: 10.8 10*3/uL — AB (ref 4.0–10.5)

## 2016-08-24 LAB — URINALYSIS, ROUTINE W REFLEX MICROSCOPIC
BILIRUBIN URINE: NEGATIVE
Glucose, UA: NEGATIVE mg/dL
KETONES UR: NEGATIVE mg/dL
Nitrite: NEGATIVE
PROTEIN: 100 mg/dL — AB
Specific Gravity, Urine: 1.013 (ref 1.005–1.030)
pH: 5 (ref 5.0–8.0)

## 2016-08-24 LAB — COMPREHENSIVE METABOLIC PANEL
ALT: 16 U/L (ref 14–54)
AST: 27 U/L (ref 15–41)
Albumin: 2.1 g/dL — ABNORMAL LOW (ref 3.5–5.0)
Alkaline Phosphatase: 123 U/L (ref 38–126)
Anion gap: 8 (ref 5–15)
BUN: 22 mg/dL — ABNORMAL HIGH (ref 6–20)
CHLORIDE: 112 mmol/L — AB (ref 101–111)
CO2: 20 mmol/L — AB (ref 22–32)
Calcium: 8.8 mg/dL — ABNORMAL LOW (ref 8.9–10.3)
Creatinine, Ser: 1.14 mg/dL — ABNORMAL HIGH (ref 0.44–1.00)
GFR calc non Af Amer: 45 mL/min — ABNORMAL LOW (ref >60)
GFR, EST AFRICAN AMERICAN: 52 mL/min — AB (ref >60)
Glucose, Bld: 105 mg/dL — ABNORMAL HIGH (ref 65–99)
Potassium: 5.1 mmol/L (ref 3.5–5.1)
SODIUM: 140 mmol/L (ref 135–145)
Total Bilirubin: 1.5 mg/dL — ABNORMAL HIGH (ref 0.3–1.2)
Total Protein: 7.2 g/dL (ref 6.5–8.1)

## 2016-08-24 LAB — HEMOGLOBIN AND HEMATOCRIT, BLOOD
HCT: 26.6 % — ABNORMAL LOW (ref 36.0–46.0)
Hemoglobin: 8.5 g/dL — ABNORMAL LOW (ref 12.0–15.0)

## 2016-08-24 LAB — PROTIME-INR
INR: 1.46
Prothrombin Time: 17.9 seconds — ABNORMAL HIGH (ref 11.4–15.2)

## 2016-08-24 LAB — MRSA PCR SCREENING: MRSA by PCR: NEGATIVE

## 2016-08-24 LAB — POC OCCULT BLOOD, ED: Fecal Occult Bld: NEGATIVE

## 2016-08-24 LAB — PREPARE RBC (CROSSMATCH)

## 2016-08-24 MED ORDER — METOPROLOL SUCCINATE ER 50 MG PO TB24
50.0000 mg | ORAL_TABLET | Freq: Every day | ORAL | Status: DC
  Administered 2016-08-25: 50 mg via ORAL
  Filled 2016-08-24: qty 1

## 2016-08-24 MED ORDER — POLYETHYLENE GLYCOL 3350 17 G PO PACK: 17.0000 g | PACK | Freq: Every day | ORAL | Status: DC | PRN

## 2016-08-24 MED ORDER — PANTOPRAZOLE SODIUM 40 MG IV SOLR
80.0000 mg | Freq: Once | INTRAVENOUS | Status: AC
  Administered 2016-08-24: 80 mg via INTRAVENOUS
  Filled 2016-08-24: qty 80

## 2016-08-24 MED ORDER — ACETAMINOPHEN 650 MG RE SUPP: 650.0000 mg | Freq: Four times a day (QID) | RECTAL | Status: DC | PRN

## 2016-08-24 MED ORDER — FLUTICASONE PROPIONATE HFA 220 MCG/ACT IN AERO: 2.0000 | INHALATION_SPRAY | Freq: Two times a day (BID) | RESPIRATORY_TRACT | Status: DC | PRN

## 2016-08-24 MED ORDER — BUPROPION HCL ER (XL) 150 MG PO TB24
150.0000 mg | ORAL_TABLET | Freq: Every day | ORAL | Status: DC
  Administered 2016-08-25: 150 mg via ORAL
  Filled 2016-08-24: qty 1

## 2016-08-24 MED ORDER — ATORVASTATIN CALCIUM 10 MG PO TABS
10.0000 mg | ORAL_TABLET | Freq: Every day | ORAL | Status: DC
  Administered 2016-08-24: 10 mg via ORAL
  Filled 2016-08-24: qty 1

## 2016-08-24 MED ORDER — ONDANSETRON HCL 4 MG PO TABS: 4.0000 mg | ORAL_TABLET | Freq: Four times a day (QID) | ORAL | Status: DC | PRN

## 2016-08-24 MED ORDER — ONDANSETRON HCL 4 MG/2ML IJ SOLN: 4.0000 mg | Freq: Four times a day (QID) | INTRAMUSCULAR | Status: DC | PRN

## 2016-08-24 MED ORDER — AMLODIPINE BESYLATE 5 MG PO TABS
5.0000 mg | ORAL_TABLET | Freq: Every day | ORAL | Status: DC
  Administered 2016-08-25: 5 mg via ORAL
  Filled 2016-08-24: qty 1

## 2016-08-24 MED ORDER — BUDESONIDE 0.5 MG/2ML IN SUSP
1.0000 mg | Freq: Two times a day (BID) | RESPIRATORY_TRACT | Status: DC
  Administered 2016-08-25: 1 mg via RESPIRATORY_TRACT
  Filled 2016-08-24 (×2): qty 4

## 2016-08-24 MED ORDER — ASPIRIN EC 325 MG PO TBEC
325.0000 mg | DELAYED_RELEASE_TABLET | Freq: Every day | ORAL | Status: DC
  Administered 2016-08-25: 325 mg via ORAL
  Filled 2016-08-24: qty 1

## 2016-08-24 MED ORDER — ISOSORBIDE MONONITRATE ER 30 MG PO TB24
30.0000 mg | ORAL_TABLET | Freq: Every day | ORAL | Status: DC
  Administered 2016-08-25: 30 mg via ORAL
  Filled 2016-08-24: qty 1

## 2016-08-24 MED ORDER — SODIUM CHLORIDE 0.9 % IV SOLN
Freq: Once | INTRAVENOUS | Status: AC
  Administered 2016-08-24: 18:00:00 via INTRAVENOUS

## 2016-08-24 MED ORDER — OXYBUTYNIN CHLORIDE 5 MG PO TABS
5.0000 mg | ORAL_TABLET | Freq: Two times a day (BID) | ORAL | Status: DC
  Administered 2016-08-24 – 2016-08-25 (×2): 5 mg via ORAL
  Filled 2016-08-24 (×2): qty 1

## 2016-08-24 MED ORDER — APIXABAN 2.5 MG PO TABS
2.5000 mg | ORAL_TABLET | Freq: Two times a day (BID) | ORAL | Status: DC
  Administered 2016-08-24 – 2016-08-25 (×2): 2.5 mg via ORAL
  Filled 2016-08-24 (×2): qty 1

## 2016-08-24 MED ORDER — PANTOPRAZOLE SODIUM 40 MG PO TBEC
80.0000 mg | DELAYED_RELEASE_TABLET | Freq: Every day | ORAL | Status: DC
  Administered 2016-08-25: 80 mg via ORAL
  Filled 2016-08-24: qty 2

## 2016-08-24 MED ORDER — OXYCODONE HCL 5 MG PO TABS
5.0000 mg | ORAL_TABLET | ORAL | Status: DC | PRN
  Administered 2016-08-24 – 2016-08-25 (×2): 5 mg via ORAL
  Filled 2016-08-24 (×2): qty 1

## 2016-08-24 MED ORDER — HYDRALAZINE HCL 25 MG PO TABS
25.0000 mg | ORAL_TABLET | Freq: Three times a day (TID) | ORAL | Status: DC
  Administered 2016-08-24 – 2016-08-25 (×3): 25 mg via ORAL
  Filled 2016-08-24 (×3): qty 1

## 2016-08-24 MED ORDER — ACETAMINOPHEN 325 MG PO TABS
650.0000 mg | ORAL_TABLET | Freq: Four times a day (QID) | ORAL | Status: DC | PRN
  Administered 2016-08-25: 650 mg via ORAL
  Filled 2016-08-24: qty 2

## 2016-08-24 NOTE — ED Notes (Signed)
IV team at bedside attempting to gain IV access  

## 2016-08-24 NOTE — Progress Notes (Signed)
Patient trasfered from ED to [phone] via bed; alert and oriented x 4; no complaints of pain; IV saline locked in RFA;  Orient patient to room and unit;  gave patient care guide; instructed how to use the call bell and  fall risk precautions. Will continue to monitor the patient.

## 2016-08-24 NOTE — ED Triage Notes (Signed)
Pt. Coming from [facility] for Hgb of 6.9. Pt. Started on iron yesterday to help, but reports generalized weakness today and was requesting being sent to [place] for evaluation. Pt. Vitals stable at this time. Pt. Recently dx with fracture of left leg and is being treated for that. RN notes foley upon arrival.

## 2016-08-24 NOTE — ED Provider Notes (Signed)
[facility]-EMERGENCY DEPT Provider Note   CSN: [id number] Arrival date & time: 08/24/16  1115     History   Chief Complaint Chief Complaint  Patient presents with  . Abnormal Lab    HPI Lisa [name] is a 76 y.o. female who  has a past medical history of Chronic diastolic CHF (congestive heart failure) (HCC); Coronary artery disease; Diabetes mellitus without complication (HCC); Hyperlipidemia; Hypertension; Morbid obesity (HCC); OSA (obstructive sleep apnea); and RBBB. She presents from the ED with a cc of Anemia. She had a recent ORIF on her Left Femur on  08/05/2016. She has a hx anemia and was 6.3 prior to the surgery. Her daughter gives the history, but the entire family and the patient have overall poor insight.  Her hgb was 6.9 today and the patient was sent in for transfusion.  She has a well healing wound on there left leg.    HPI  Past Medical History:  Diagnosis Date  . Chronic diastolic CHF (congestive heart failure) (HCC)   . Coronary artery disease    s/p CABG  . Diabetes mellitus without complication (HCC)   . Hyperlipidemia   . Hypertension   . Morbid obesity (HCC)   . OSA (obstructive sleep apnea)    mild OSA with AHI 7.15 now on CPAP at 16cm H2O  . RBBB     Patient Active Problem List   Diagnosis Date Noted  . Hypertensive heart disease   . CAD (coronary artery disease) 08/02/2016  . Femur fracture, left (HCC) 08/02/2016  . Fibula fracture 08/02/2016  . Coagulopathy (HCC) 08/02/2016  . DNR (do not resuscitate) discussion   . Chronic anticoagulation 06/07/2016  . Anemia, normocytic normochromic 06/07/2016  . Acute on chronic renal insufficiency 06/07/2016  . Chronic atrial fibrillation (HCC)   . Pressure sore 06/06/2016  . Chronic respiratory failure with hypoxia (HCC) 06/03/2016  . Acute renal failure (ARF) (HCC)   . PVC (premature ventricular contraction) 03/22/2014  . Chronic diastolic heart failure (HCC) 05/06/2013  . OSA (obstructive sleep  apnea) 05/06/2013  . Morbid obesity (HCC)   . Hyperlipidemia   . GERD (gastroesophageal reflux disease) 10/27/2010  . Gout 10/27/2010  . Hypertension 10/27/2010  . Asthma 10/27/2010  . Diabetes mellitus (HCC) 10/27/2010    Past Surgical History:  Procedure Laterality Date  . BACK SURGERY    . CARDIOVERSION N/A 08/03/2015   Procedure: CARDIOVERSION;  Surgeon: Vesta Mixer, MD;  Location[facility]OSCOPY;  Service: Cardiovascular;  Laterality: N/A;  . CORONARY ANGIOPLASTY WITH STENT PLACEMENT    . CORONARY ARTERY BYPASS GRAFT    . CYSTOSCOPY W/ URETERAL STENT PLACEMENT Left 08/05/2016   Procedure: CYSTOSCOPY WITH RETROGRADE PYELOGRAM/URETERAL STENT PLACEMENT;  Surgeon: Marcine Matar, MD;  Location[facility] OR;  Service: Urology;  Laterality: Left;  . EXTERNAL FIXATION LEG Left 08/05/2016   Procedure: EXTERNAL FIXATION FEMUR, ORIF FEMUR;  Surgeon: Eldred Manges, MD;  Location: [facility] OR;  Service: Orthopedics;  Laterality: Left;  . KNEE SURGERY Bilateral   . ORIF ANKLE FRACTURE Right 08/19/2014   Procedure: OPEN REDUCTION INTERNAL FIXATION (ORIF) ANKLE FRACTURE;  Surgeon: Cammy Copa, MD;  Location[facility] OR;  Service: Orthopedics;  Laterality: Right;  . rotator cuff surgery      OB History    No data available       Home Medications    Prior to Admission medications   Medication Sig Start Date End Date Taking? Authorizing Provider  amLODipine (NORVASC) 5 MG tablet Take  1 tablet (5 mg total) by mouth daily. 09/20/15   Quintella Reichert, MD  apixaban (ELIQUIS) 2.5 MG TABS tablet Take 1 tablet (2.5 mg total) by mouth 2 (two) times daily. 08/10/16   Merlene Laughter, DO  aspirin EC 325 MG EC tablet Take 1 tablet (325 mg total) by mouth daily with breakfast. 08/11/16   Merlene Laughter, DO  atorvastatin (LIPITOR) 10 MG tablet Take 1 tablet (10 mg total) by mouth daily at 6 PM. 05/08/16   Elease Etienne, MD  budesonide (PULMICORT) 0.5 MG/2ML nebulizer solution Take 4 mLs (1 mg total) by  nebulization 2 (two) times daily. 08/10/16   Omair Latif Sheikh, DO  buPROPion (WELLBUTRIN XL) 150 MG 24 hr tablet Take 150 mg by mouth daily. Reported on 08/02/2015    Historical Provider, MD  docusate sodium (COLACE) 100 MG capsule Take 1 capsule (100 mg total) by mouth 2 (two) times daily. 08/10/16   Merlene Laughter, DO  feeding supplement (BOOST / RESOURCE BREEZE) LIQD Take 1 Container by mouth 3 (three) times daily between meals. 08/10/16   Heloise Beecham Sheikh, DO  fluticasone (FLOVENT HFA) 220 MCG/ACT inhaler Inhale 2 puffs into the lungs 2 (two) times daily as needed (shortness of breath/ wheezing).    Historical Provider, MD  hydrALAZINE (APRESOLINE) 25 MG tablet Take 1 tablet (25 mg total) by mouth every 8 (eight) hours. 06/13/16   Rhetta Mura, MD  HYDROcodone-acetaminophen (NORCO/VICODIN) 5-325 MG tablet Take 1-2 tablets by mouth every 6 (six) hours as needed for moderate pain. 08/09/16   Naida Sleight, PA-C  ipratropium-albuterol (DUONEB) 0.5-2.5 (3) MG/3ML SOLN Take 3 mLs by nebulization every 6 (six) hours as needed. Wheezing or shortness of breath. Patient taking differently: Take 3 mLs by nebulization every 6 (six) hours as needed (shortness of breath/ wheezing).  05/08/16   Elease Etienne, MD  isosorbide mononitrate (IMDUR) 30 MG 24 hr tablet Take 1 tablet (30 mg total) by mouth daily. Patient not taking: Reported on 08/03/2016 06/13/16   Rhetta Mura, MD  metoprolol succinate (TOPROL-XL) 50 MG 24 hr tablet Take 1 tablet (50 mg total) by mouth daily. Take with or immediately following a meal. 06/13/16   Rhetta Mura, MD  omeprazole (PRILOSEC) 20 MG capsule Take 20 mg by mouth daily. 07/28/16   Historical Provider, MD  OXYGEN Inhale 2 L into the lungs continuous.    Historical Provider, MD  polyethylene glycol (MIRALAX / GLYCOLAX) packet Take 17 g by mouth daily as needed for mild constipation. 08/10/16   Merlene Laughter, DO  potassium chloride (K-DUR,KLOR-CON) 10 MEQ tablet  Take 20 mEq by mouth 2 (two) times daily. 07/28/16   Historical Provider, MD  PROMETHEGAN 25 MG suppository Place 25 mg rectally daily as needed for nausea or vomiting.  07/28/16   Historical Provider, MD  traMADol (ULTRAM) 50 MG tablet Take 1 tablet (50 mg total) by mouth every 12 (twelve) hours as needed (pain). 08/10/16   Merlene Laughter, DO    Family History Family History  Problem Relation Age of Onset  . Heart attack Mother   . Heart disease Mother   . Stomach cancer Father     Social History Social History  Substance Use Topics  . Smoking status: Former Games developer  . Smokeless tobacco: Never Used  . Alcohol use No     Allergies   Patient has no known allergies.   Review of Systems Review of Systems  Ten systems reviewed and  are negative for acute change, except as noted in the HPI.   Physical Exam Updated Vital Signs BP (!) 129/43   Pulse 85   Temp 98.5 F (36.9 C) (Oral)   Resp 16   Ht 5\' 4"  (1.626 m)   Wt 105.2 kg   LMP  (LMP Unknown)   SpO2 97%   BMI 39.82 kg/m   Physical Exam  Constitutional: She is oriented to person, place, and time. She appears well-developed and well-nourished. No distress.  HENT:  Head: Normocephalic and atraumatic.  Minimal dentition Pale conjunctiva Pale oral mucosa  Eyes: Conjunctivae are normal. No scleral icterus.  Neck: Normal range of motion.  Cardiovascular: Normal rate, regular rhythm and normal heart sounds.  Exam reveals no gallop and no friction rub.   No murmur heard. Pulmonary/Chest: Effort normal and breath sounds normal. No respiratory distress.  Abdominal: Soft. Bowel sounds are normal. She exhibits no distension and no mass. There is no tenderness. There is no guarding.  Neurological: She is alert and oriented to person, place, and time.  Skin: Skin is warm and dry. She is not diaphoretic.  Left leg with  Stage 3 ulcer, appears well healing Staples without sign of infection.     ED Treatments / Results    Labs (all labs ordered are listed, but only abnormal results are displayed) Labs Reviewed  PROTIME-INR  CBC  COMPREHENSIVE METABOLIC PANEL  TYPE AND SCREEN    EKG  EKG Interpretation None       Radiology No results found.  Procedures .Critical Care Performed by: Arthor CaptainHARRIS, Yamili Lichtenwalner Authorized by: Arthor CaptainHARRIS, Ashling Roane   Critical care provider statement:    Critical care time (minutes):  40   Critical care time was exclusive of:  Separately billable procedures and treating other patients   Critical care was necessary to treat or prevent imminent or life-threatening deterioration of the following conditions:  Circulatory failure (Symptomatic anemia)   Critical care was time spent personally by me on the following activities:  Review of old charts, pulse oximetry, ordering and performing treatments and interventions, ordering and review of laboratory studies, obtaining history from patient or surrogate, examination of patient, evaluation of patient's response to treatment, discussions with consultants and development of treatment plan with patient or surrogate   (including critical care time)  Medications Ordered in ED Medications  pantoprazole (PROTONIX) 80 mg in sodium chloride 0.9 % 100 mL IVPB (not administered)     Initial Impression / Assessment and Plan / ED Course  I have reviewed the triage vital signs and the nursing notes.  Pertinent labs & imaging results that were available during my care of the patient were reviewed by me and considered in my medical decision making (see chart for details).     Patient here for symptomatic anemia. She will need transfusion. Patient will be admitted. Stable throughout ED visit  Final Clinical Impressions(s) / ED Diagnoses   Final diagnoses:  None    New Prescriptions New Prescriptions   No medications on file     Arthor Captainbigail Drayson Dorko, PA-C 08/24/16 1643    Melene Planan Floyd, DO 08/25/16 1543

## 2016-08-24 NOTE — H&P (Signed)
[facility] History and Physical  DAMONIQUE BRUNELLE ZOX:[id number] DOB: March 19, 1941 DOA: 08/24/2016  Referring physician:  PCP: Laurena Slimmer, MD   Chief Complaint: Sent by rehabilitation facility due to low hemoglobin  HPI: Lisa [name] is a 76 y.o. female  patient with history of chronic anemia, hypertension, hyperlipidemia, CABG, heart failure, sleep apnea presents to the hospital after being sent for critical hemoglobin. Patient had hemoglobin of 6.9. She is currently in rehabilitation after having an external fixator on her left lower extremity for fractures. Patient states that she feels well and has no complaints. Patient is agreeable transfusion.  Denies fevers, chills, nausea, vomiting or diarrhea, headache, blurry vision.   Review of Systems:  As per HPI otherwise 10 point review of systems negative.    Past Medical History:  Diagnosis Date  . Chronic diastolic CHF (congestive heart failure) (HCC)   . Coronary artery disease    s/p CABG  . Diabetes mellitus without complication (HCC)   . Hyperlipidemia   . Hypertension   . Morbid obesity (HCC)   . OSA (obstructive sleep apnea)    mild OSA with AHI 7.15 now on CPAP at 16cm H2O  . RBBB    Past Surgical History:  Procedure Laterality Date  . BACK SURGERY    . CARDIOVERSION N/A 08/03/2015   Procedure: CARDIOVERSION;  Surgeon: Vesta Mixer, MD;  Location: [facility] ENDOSCOPY;  Service: Cardiovascular;  Laterality: N/A;  . CORONARY ANGIOPLASTY WITH STENT PLACEMENT    . CORONARY ARTERY BYPASS GRAFT    . CYSTOSCOPY W/ URETERAL STENT PLACEMENT Left 08/05/2016   Procedure: CYSTOSCOPY WITH RETROGRADE PYELOGRAM/URETERAL STENT PLACEMENT;  Surgeon: Marcine Matar, MD;  Location: [facility] OR;  Service: Urology;  Laterality: Left;  . EXTERNAL FIXATION LEG Left 08/05/2016   Procedure: EXTERNAL FIXATION FEMUR, ORIF FEMUR;  Surgeon: Eldred Manges, MD;  Location: [facility] OR;  Service: Orthopedics;  Laterality: Left;  . KNEE SURGERY  Bilateral   . ORIF ANKLE FRACTURE Right 08/19/2014   Procedure: OPEN REDUCTION INTERNAL FIXATION (ORIF) ANKLE FRACTURE;  Surgeon: Cammy Copa, MD;  Location: [facility] OR;  Service: Orthopedics;  Laterality: Right;  . rotator cuff surgery     Social History:  reports that she has quit smoking. She has never used smokeless tobacco. She reports that she does not drink alcohol or use drugs.  No Known Allergies  Family History  Problem Relation Age of Onset  . Heart attack Mother   . Heart disease Mother   . Stomach cancer Father      Prior to Admission medications   Medication Sig Start Date End Date Taking? Authorizing Provider  acetaminophen (TYLENOL) 325 MG tablet Take 650 mg by mouth every 4 (four) hours as needed for moderate pain.   Yes Historical Provider, MD  amLODipine (NORVASC) 5 MG tablet Take 1 tablet (5 mg total) by mouth daily. 09/20/15  Yes Quintella Reichert, MD  apixaban (ELIQUIS) 2.5 MG TABS tablet Take 1 tablet (2.5 mg total) by mouth 2 (two) times daily. 08/10/16  Yes Heloise Beecham Sheikh, DO  aspirin EC 325 MG EC tablet Take 1 tablet (325 mg total) by mouth daily with breakfast. 08/11/16  Yes Omair Latif Sheikh, DO  atorvastatin (LIPITOR) 10 MG tablet Take 1 tablet (10 mg total) by mouth daily at 6 PM. 05/08/16  Yes Elease Etienne, MD  budesonide (PULMICORT) 0.5 MG/2ML nebulizer solution Take 4 mLs (1 mg total) by nebulization 2 (two) times daily. 08/10/16  Yes Heloise Beecham  Sheikh, DO  buPROPion (WELLBUTRIN XL) 150 MG 24 hr tablet Take 150 mg by mouth daily. Reported on 08/02/2015   Yes Historical Provider, MD  cholecalciferol (VITAMIN D) 1000 units tablet Take 1,000 Units by mouth daily.   Yes Historical Provider, MD  ferrous sulfate 325 (65 FE) MG tablet Take 325 mg by mouth 3 (three) times daily with meals.   Yes Historical Provider, MD  fluticasone (FLOVENT HFA) 220 MCG/ACT inhaler Inhale 2 puffs into the lungs 2 (two) times daily as needed (shortness of breath/ wheezing).   Yes  Historical Provider, MD  hydrALAZINE (APRESOLINE) 25 MG tablet Take 1 tablet (25 mg total) by mouth every 8 (eight) hours. 06/13/16  Yes Rhetta Mura, MD  HYDROcodone-acetaminophen (NORCO/VICODIN) 5-325 MG tablet Take 1-2 tablets by mouth every 6 (six) hours as needed for moderate pain. Patient taking differently: Take 1 tablet by mouth See admin instructions. Take 1 tablet by mouth every 24 hours as needed for pain. May give 1 tablet by mouth at bedtime as needed for pain. 08/09/16  Yes Naida Sleight, PA-C  ipratropium-albuterol (DUONEB) 0.5-2.5 (3) MG/3ML SOLN Take 3 mLs by nebulization every 6 (six) hours as needed. Wheezing or shortness of breath. Patient taking differently: Take 3 mLs by nebulization every 6 (six) hours as needed (shortness of breath/ wheezing).  05/08/16  Yes Elease Etienne, MD  isosorbide mononitrate (IMDUR) 30 MG 24 hr tablet Take 1 tablet (30 mg total) by mouth daily. 06/13/16  Yes Rhetta Mura, MD  metoprolol succinate (TOPROL-XL) 50 MG 24 hr tablet Take 1 tablet (50 mg total) by mouth daily. Take with or immediately following a meal. 06/13/16  Yes Rhetta Mura, MD  omeprazole (PRILOSEC) 20 MG capsule Take 20 mg by mouth daily. 07/28/16  Yes Historical Provider, MD  oxybutynin (DITROPAN) 5 MG tablet Take 5 mg by mouth 2 (two) times daily.   Yes Historical Provider, MD  polyethylene glycol (MIRALAX / GLYCOLAX) packet Take 17 g by mouth daily as needed for mild constipation. 08/10/16  Yes Omair Latif Sheikh, DO  PROMETHEGAN 25 MG suppository Place 25 mg rectally daily as needed for nausea or vomiting.  07/28/16  Yes Historical Provider, MD  sodium hypochlorite (DAKIN'S 1/4 STRENGTH) 0.125 % SOLN Irrigate with 1 application as directed 2 (two) times daily.   Yes Historical Provider, MD  traMADol (ULTRAM) 50 MG tablet Take 1 tablet (50 mg total) by mouth every 12 (twelve) hours as needed (pain). Patient taking differently: Take 50 mg by mouth every 8 (eight) hours as  needed for moderate pain or severe pain.  08/10/16  Yes Omair Latif Sheikh, DO  docusate sodium (COLACE) 100 MG capsule Take 1 capsule (100 mg total) by mouth 2 (two) times daily. Patient not taking: Reported on 08/24/2016 08/10/16   Merlene Laughter, DO  feeding supplement (BOOST / RESOURCE BREEZE) LIQD Take 1 Container by mouth 3 (three) times daily between meals. Patient not taking: Reported on 08/24/2016 08/10/16   Merlene Laughter, DO  OXYGEN Inhale 2 L into the lungs continuous.    Historical Provider, MD   Physical Exam: Vitals:   08/24/16 1230 08/24/16 1300 08/24/16 1330 08/24/16 1400  BP: 127/74 128/95 141/72 140/68  Pulse: 86  85   Resp:  22    Temp:      TempSrc:      SpO2: 100%  100%   Weight:      Height:        Wt Readings from  Last 3 Encounters:  08/24/16 105.2 kg (232 lb)  08/10/16 105.2 kg (232 lb)  06/25/16 103.4 kg (228 lb)    General:  Appears calm and comfortable, alert and oriented 3 but difficulty with year and president this is baseline per the daughter Eyes:  PERRL, EOMI, normal lids, iris ENT:  grossly normal hearing, lips & tongue Neck:  no LAD, masses or thyromegaly Cardiovascular:  RRR, no m/r/g. No LE edema.  Respiratory:  CTA bilaterally, no w/r/r. Normal respiratory effort. Abdomen:  soft, ntnd Skin:  Ulcers on bottom Musculoskeletal:  grossly normal tone BUE/BLE Psychiatric:  grossly normal mood and affect, speech fluent and appropriate Neurologic:  CN 2-12 grossly intact, moves all extremities in coordinated fashion.          Labs on Admission:  Basic Metabolic Panel:  Recent Labs Lab 08/24/16 1224  NA 140  K 5.1  CL 112*  CO2 20*  GLUCOSE 105*  BUN 22*  CREATININE 1.14*  CALCIUM 8.8*   Liver Function Tests:  Recent Labs Lab 08/24/16 1224  AST 27  ALT 16  ALKPHOS 123  BILITOT 1.5*  PROT 7.2  ALBUMIN 2.1*   No results for input(s): LIPASE, AMYLASE in the last 168 hours. No results for input(s): AMMONIA in the last 168  hours. CBC:  Recent Labs Lab 08/24/16 1224  WBC 10.8*  HGB 7.2*  HCT 23.0*  MCV 99.6  PLT 147*   Cardiac Enzymes: No results for input(s): CKTOTAL, CKMB, CKMBINDEX, TROPONINI in the last 168 hours.  BNP (last 3 results)  Recent Labs  05/03/16 1859 06/02/16 1914 08/02/16 2352  BNP 257.2* 757.9* 500.3*    ProBNP (last 3 results) No results for input(s): PROBNP in the last 8760 hours.   Serum creatinine: 1.14 mg/dL High 16/04/9601/17/18 [id number] Estimated creatinine clearance: 48.9 mL/min  CBG: No results for input(s): GLUCAP in the last 168 hours.  Radiological Exams on Admission: No results found.  EKG: n/a  Assessment/Plan Active Problems:   GERD (gastroesophageal reflux disease)   Gout   Hypertension   Asthma   Diabetes mellitus (HCC)   Hyperlipidemia   Chronic diastolic heart failure (HCC)   OSA (obstructive sleep apnea)   Chronic atrial fibrillation (HCC)   Chronic anticoagulation   Acute on chronic anemia We'll transfuse 1 unit of blood as patient's goal based on record review seems to be between 7 and 9 patient has had a recent decrease in her hemoglobin to between 6.9 7.2 Hemoccult negative for blood Waiting for UA Hold iron supplement Pt requesting hem/onc consult for anemia for OP workup on d/c  Diabetes SSI  Asthma When necessary albuterol, Flovent  Hypertension Scheduled hydralazine, Norvasc  Hyperlipidemia Continue statin  Mood disorder Continue Wellbutrin  Urinary issue Continue Ditropan  Atrial fibrillation Continue aspirin/eiquis/Imdur/metoprolol  GERD Continue PPI  Code Status: FULL  DVT Prophylaxis:Eliquis Family Communication: Dgtr at bedside Disposition Plan: Pending Improvement  Status: med-surg obs  Haydee SalterPhillip M Clearnce Leja, MD Family Medicine [facility] [email or web address] Password [email or web address]

## 2016-08-24 NOTE — ED Notes (Signed)
Unable to gain IV access, pts vitals stable, A&O x4, IV team consult placed

## 2016-08-25 DIAGNOSIS — D508 Other iron deficiency anemias: Secondary | ICD-10-CM | POA: Diagnosis not present

## 2016-08-25 DIAGNOSIS — D649 Anemia, unspecified: Secondary | ICD-10-CM | POA: Diagnosis not present

## 2016-08-25 LAB — BASIC METABOLIC PANEL
ANION GAP: 6 (ref 5–15)
BUN: 18 mg/dL (ref 6–20)
CO2: 20 mmol/L — AB (ref 22–32)
Calcium: 8.6 mg/dL — ABNORMAL LOW (ref 8.9–10.3)
Chloride: 112 mmol/L — ABNORMAL HIGH (ref 101–111)
Creatinine, Ser: 1.11 mg/dL — ABNORMAL HIGH (ref 0.44–1.00)
GFR calc Af Amer: 54 mL/min — ABNORMAL LOW (ref >60)
GFR calc non Af Amer: 47 mL/min — ABNORMAL LOW (ref >60)
GLUCOSE: 66 mg/dL (ref 65–99)
POTASSIUM: 4.6 mmol/L (ref 3.5–5.1)
Sodium: 138 mmol/L (ref 135–145)

## 2016-08-25 LAB — GLUCOSE, CAPILLARY: Glucose-Capillary: 78 mg/dL (ref 65–99)

## 2016-08-25 NOTE — Progress Notes (Signed)
[facility]; discharged instructions review and give to facility by social worker with care notes; IV DIC; dressings on her wounds were changed; patient will be transported to facility via [facility]. Facility was called and report was given to nurse who is going to receive the patient.

## 2016-08-25 NOTE — Discharge Summary (Addendum)
Physician Discharge Summary  Lisa [name] NWG:[id number] DOB: 10-25-1940 DOA: 08/24/2016  PCP: Laurena Slimmer, MD  Admit date: 08/24/2016 Discharge date: 08/25/2016  Time spent: > 35 minutes  Recommendations for Outpatient Follow-up:  1. Assess cbc.  2. Discontinue aspirin given that patient is already anticoagulated on eliquis   Discharge Diagnoses:  Principal Problem:   Anemia Active Problems:   GERD (gastroesophageal reflux disease)   Gout   Hypertension   Asthma   Diabetes mellitus (HCC)   Hyperlipidemia   Chronic diastolic heart failure (HCC)   OSA (obstructive sleep apnea)   Chronic atrial fibrillation (HCC)   Chronic anticoagulation   Pressure injury of skin   Discharge Condition: stable  Diet recommendation: heart healthy  Filed Weights   08/24/16 1120 08/24/16 2229 08/25/16 0500  Weight: 105.2 kg (232 lb) 105.6 kg (232 lb 12.8 oz) 105 kg (231 lb 7.7 oz)    History of present illness:  Lisa [name] is a 76 y.o. female  patient with history of chronic anemia, hypertension, hyperlipidemia, CABG, heart failure, sleep apnea presents to the hospital after being sent for critical hemoglobin. Patient had hemoglobin of 6.9. She is currently in rehabilitation after having an external fixator on her left lower extremity for fractures  Hospital Course:  Acute on chronic anemia We'll transfuse 1 unit of blood as patient's goal based on record review seems to be between 7 and 9 patient has had a recent decrease in her hemoglobin to between 6.9 7.2 Hemoccult negative for blood Waiting for UA Hold iron supplement Pt requesting hem/onc consult for anemia for OP workup on d/c  Diabetes - continue prior to admission medication regimen  Asthma Stable continue home medication regimen  Hypertension stable  Hyperlipidemia Continue statin  Mood disorder Continue Wellbutrin  Urinary issue Continue Ditropan  Atrial fibrillation Continue  eiquis/Imdur/metoprolol - Given recent anemia held aspirin. Will defer to pcp at SNF whether or not to continue  GERD Continue PPI  Procedures:  None  Consultations:  Urology  Cardiology  Discharge Exam: Vitals:   08/24/16 2229 08/25/16 0612  BP: (!) 126/49 (!) 108/50  Pulse: 84 97  Resp: 20 18  Temp: 98 F (36.7 C) 98.2 F (36.8 C)    General: Pt in nad, alert and awake Cardiovascular: no cyanosis Respiratory: no increased wob, no wheezes  Discharge Instructions   Discharge Instructions    Call MD for:  redness, tenderness, or signs of infection (pain, swelling, redness, odor or green/yellow discharge around incision site)    Complete by:  As directed    Call MD for:  temperature >100.4    Complete by:  As directed    Diet - low sodium heart healthy    Complete by:  As directed    Discharge instructions    Complete by:  As directed    Please be sure to follow-up with your primary care physician at facility. In sure you have your CBC rechecked within the next 7 days. Discontinued aspirin. Will defer to your primary care physician whether to continue aspirin or not   Increase activity slowly    Complete by:  As directed      Current Discharge Medication List    CONTINUE these medications which have NOT CHANGED   Details  acetaminophen (TYLENOL) 325 MG tablet Take 650 mg by mouth every 4 (four) hours as needed for moderate pain.    amLODipine (NORVASC) 5 MG tablet Take 1 tablet (5 mg total) by mouth daily.  Qty: 30 tablet, Refills: 11    apixaban (ELIQUIS) 2.5 MG TABS tablet Take 1 tablet (2.5 mg total) by mouth 2 (two) times daily. Qty: 60 tablet, Refills: 0    atorvastatin (LIPITOR) 10 MG tablet Take 1 tablet (10 mg total) by mouth daily at 6 PM. Qty: 30 tablet, Refills: 0    budesonide (PULMICORT) 0.5 MG/2ML nebulizer solution Take 4 mLs (1 mg total) by nebulization 2 (two) times daily. Qty: 2 mL, Refills: 0    buPROPion (WELLBUTRIN XL) 150 MG 24 hr  tablet Take 150 mg by mouth daily. Reported on 08/02/2015    cholecalciferol (VITAMIN D) 1000 units tablet Take 1,000 Units by mouth daily.    ferrous sulfate 325 (65 FE) MG tablet Take 325 mg by mouth 3 (three) times daily with meals.    fluticasone (FLOVENT HFA) 220 MCG/ACT inhaler Inhale 2 puffs into the lungs 2 (two) times daily as needed (shortness of breath/ wheezing).    hydrALAZINE (APRESOLINE) 25 MG tablet Take 1 tablet (25 mg total) by mouth every 8 (eight) hours. Qty: 90 tablet, Refills: 0    HYDROcodone-acetaminophen (NORCO/VICODIN) 5-325 MG tablet Take 1-2 tablets by mouth every 6 (six) hours as needed for moderate pain. Qty: 60 tablet, Refills: 0    ipratropium-albuterol (DUONEB) 0.5-2.5 (3) MG/3ML SOLN Take 3 mLs by nebulization every 6 (six) hours as needed. Wheezing or shortness of breath.    isosorbide mononitrate (IMDUR) 30 MG 24 hr tablet Take 1 tablet (30 mg total) by mouth daily. Qty: 30 tablet, Refills: 0    metoprolol succinate (TOPROL-XL) 50 MG 24 hr tablet Take 1 tablet (50 mg total) by mouth daily. Take with or immediately following a meal. Qty: 30 tablet, Refills: 0    omeprazole (PRILOSEC) 20 MG capsule Take 20 mg by mouth daily.    oxybutynin (DITROPAN) 5 MG tablet Take 5 mg by mouth 2 (two) times daily.    polyethylene glycol (MIRALAX / GLYCOLAX) packet Take 17 g by mouth daily as needed for mild constipation. Qty: 14 each, Refills: 0    PROMETHEGAN 25 MG suppository Place 25 mg rectally daily as needed for nausea or vomiting.     sodium hypochlorite (DAKIN'S 1/4 STRENGTH) 0.125 % SOLN Irrigate with 1 application as directed 2 (two) times daily.    OXYGEN Inhale 2 L into the lungs continuous.      STOP taking these medications     aspirin EC 325 MG EC tablet      traMADol (ULTRAM) 50 MG tablet        No Known Allergies    The results of significant diagnostics from this hospitalization (including imaging, microbiology, ancillary and  laboratory) are listed below for reference.    Significant Diagnostic Studies: Dg Chest 1 View  Result Date: 08/02/2016 CLINICAL DATA:  Pain after fall.  Preop. EXAM: CHEST 1 VIEW COMPARISON:  06/07/2016 and 05/04/2016 CXR FINDINGS: Low lung volumes as before. Status post CABG. Scarring in the right mid lung and left upper lobe. No pneumonic consolidation, effusion or pneumothorax. Osteoarthritis both glenohumeral joints. No acute fracture identified of the included ribs. IMPRESSION: No active disease.  Chronic right mid and left upper lobe scarring Electronically Signed   By: Tollie Eth M.D.   On: 08/02/2016 21:30   Dg Pelvis 1-2 Views  Result Date: 08/02/2016 CLINICAL DATA:  Pain after fall EXAM: PELVIS - 1-2 VIEW COMPARISON:  None. FINDINGS: There is no evidence of pelvic fracture or diastasis. Hip joints are  maintained bilaterally. The included lumbar spine demonstrates lumbar spinal fusion hardware at L4 and L5. No pelvic bone lesions are seen. IMPRESSION: No acute pelvic fracture. No hip dislocation. No proximal femoral fracture is noted. Partially included lumbar fixation hardware noted along the lower lumbar spine to L5. Electronically Signed   By: Tollie Ethavid  Kwon M.D.   On: 08/02/2016 21:26   Dg Knee 1-2 Views Left  Result Date: 08/05/2016 CLINICAL DATA:  Internal fixation of left femoral fracture. Initial encounter. EXAM: LEFT KNEE - 1-2 VIEW COMPARISON:  Left femur radiographs performed 08/02/2016 FINDINGS: Seven fluoroscopic C-arm images are provided from the OR, demonstrating placement of a plate and screws across the mid to distal femur, transfixing the comminuted distal femoral fracture with mild residual medial displacement and anterior angulation. No new fractures are seen. The patient's total knee arthroplasty is grossly unremarkable in appearance. IMPRESSION: Status post internal fixation of distal left femur, with mild residual medial displacement and anterior angulation. Electronically  Signed   By: Roanna RaiderJeffery  Chang M.D.   On: 08/05/2016 23:58   Dg Tibia/fibula Left  Result Date: 08/02/2016 CLINICAL DATA:  Left leg pain after fall EXAM: LEFT TIBIA AND FIBULA - 2 VIEW COMPARISON:  None. FINDINGS: There is a subtle oblique fracture of the distal diaphysis of the fibula just above the tibiotalar joint without definite extension into the ankle joint. Minimally widened appearance of the lateral tibiotalar joint in the craniocaudad axis to 6 mm. Supracondylar fracture of the femur is excluded on this study. The patient is status post total knee arthroplasty. There is arteriosclerosis along the tibial arteries. IMPRESSION: Subtle oblique closed fracture of the distal diaphysis of the fibula which appears to spare the ankle joint. Minimal widening along the lateral aspect of the tibiotalar joint in the craniocaudad axis up to 6 mm. Electronically Signed   By: Tollie Ethavid  Kwon M.D.   On: 08/02/2016 21:19   [place] Abdomen Complete  Result Date: 08/03/2016 CLINICAL DATA:  76 year old female with acute renal failure. History of hypertension and diabetes. EXAM: ABDOMEN ULTRASOUND COMPLETE COMPARISON:  Abdominal ultrasound dated 05/07/2016 and CT dated 05/05/2016 FINDINGS: Evaluation is limited due to patient's body habitus and portable technique. Gallbladder: Cholecystectomy. Common bile duct: Diameter: 6 mm. Liver: Apparent increased hepatic echogenicity may represent fatty infiltration. There liver is poorly visualized. IVC: No abnormality visualized. Pancreas: Not visualized. Spleen: Size and appearance within normal limits. Right Kidney: Length: 10.5 Cm. The kidney demonstrates increased echotexture compatible with underlying chronic kidney disease. There is no hydronephrosis or echogenic stone. There is a 4.8 x 4.5 x 4.5 cm right renal cyst. Left Kidney: Length: 11.5 cm. There is increased left renal echogenicity. There is moderate left hydronephrosis with parenchymal atrophy. No definite echogenic stone  identified. Abdominal aorta: No aneurysm visualized. Other findings: None. IMPRESSION: Increased renal echotexture likely related to underlying medical renal disease. There is moderate left hydronephrosis. Cholecystectomy. Electronically Signed   By: Elgie CollardArash  Radparvar M.D.   On: 08/03/2016 07:20   Dg Knee Complete 4 Views Left  Result Date: 08/02/2016 CLINICAL DATA:  Acute left knee pain after fall EXAM: LEFT KNEE - COMPLETE 4+ VIEW COMPARISON:  03/31/2014 FINDINGS: Acute, closed, comminuted supracondylar fracture of the left distal femoral diaphysis. There is dorsal displacement of the femoral condyles one shaft width and lateral displaced 1/2 shaft width relative to the femoral shaft. Lipohemarthrosis is noted. The patient is status post left knee arthroplasty. The fracture appears to spare the femoral condylar components. No loosening nor involvement of  the tibial tray nor patella. No dislocation at the knee joint. Femoral and popliteal arteriosclerosis. IMPRESSION: Acute, closed, comminuted supracondylar fracture of the distal left femoral diaphysis sparing left knee arthroplasty components with dorsolateral displacement of the femoral condyles. Lipohemarthrosis. Electronically Signed   By: Tollie Eth M.D.   On: 08/02/2016 21:14   Dg C-arm 1-60 Min-no Report  Result Date: 08/23/2016 There is no Radiologist interpretation  for this exam.  Dg C-arm 61-120 Min  Result Date: 08/05/2016 CLINICAL DATA:  Internal fixation of left femoral fracture. Initial encounter. EXAM: LEFT KNEE - 1-2 VIEW COMPARISON:  Left femur radiographs performed 08/02/2016 FINDINGS: Seven fluoroscopic C-arm images are provided from the OR, demonstrating placement of a plate and screws across the mid to distal femur, transfixing the comminuted distal femoral fracture with mild residual medial displacement and anterior angulation. No new fractures are seen. The patient's total knee arthroplasty is grossly unremarkable in appearance.  IMPRESSION: Status post internal fixation of distal left femur, with mild residual medial displacement and anterior angulation. Electronically Signed   By: Roanna Raider M.D.   On: 08/05/2016 23:58   Ct Renal Stone Study  Result Date: 08/04/2016 CLINICAL DATA:  Hydronephrosis. EXAM: CT ABDOMEN AND PELVIS WITHOUT CONTRAST TECHNIQUE: Multidetector CT imaging of the abdomen and pelvis was performed following the standard protocol without IV contrast. COMPARISON:  Abdominal ultrasound 08/03/2016, CT of the abdomen pelvis 05/05/2016 FINDINGS: Lower chest: No acute abnormality. Hepatobiliary: No focal liver abnormality is seen. Status post cholecystectomy. No biliary dilatation. Pancreas: Unremarkable. No pancreatic ductal dilatation or surrounding inflammatory changes. Spleen: Normal in size without focal abnormality. Adrenals/Urinary Tract: Bilateral adrenal glands are normal. Mild right renal cortical thinning. Two right renal cysts, the larger of which in the lower pole is partially exophytic and measures 4.6 cm. There is a marked left hydronephrosis without evidence of left hydroureter. No obstructive calculi es seen. Reactive perirenal and periureteral fat stranding is seen. Stomach/Bowel: Stomach is within normal limits. No evidence of appendicitis. No evidence of bowel wall thickening, distention, or inflammatory changes. Vascular/Lymphatic: Aortic atherosclerosis. No enlarged abdominal or pelvic lymph nodes. Reproductive: Status post hysterectomy. No adnexal masses. Other: Small fat containing periumbilical hernia.  Mild anasarca. Musculoskeletal: Lumbosacral spine laminectomy and fusion noted. S shaped thoraco lumbar spine scoliosis with advanced osteoarthritic changes. No evidence of fracture or suspicious osseous lesions. IMPRESSION: Marked left hydronephrosis without evidence of left hydroureter. No obstructive nephrolithiasis is seen. Given these findings obstructive mass at the left UPJ becomes of  primarily consideration. The lack of IV contrast precludes proper evaluation of the renal parenchyma and left ureter. Electronically Signed   By: Ted Mcalpine M.D.   On: 08/04/2016 10:37   Dg Femur Min 2 Views Left  Result Date: 08/02/2016 CLINICAL DATA:  Pain after fall EXAM: LEFT FEMUR 2 VIEWS COMPARISON:  None. FINDINGS: No acute fracture about the left hip. The left hip joint is maintained. Left superior and inferior pubic rami appear intact. Acute, closed, comminuted supracondylar fracture of the distal femoral diaphysis sparing the femoral condylar component of a total knee arthroplasty. Small 4.1 x 1.7 x 1.5 cm butterfly fragment is noted dorsolaterally. There is dorsal and lateral displacement of the femoral condyles relative to the femoral shaft by 1/2 shaft width in both directions on these images. IMPRESSION: Acute, closed, comminuted supracondylar fracture of the left distal femoral diaphysis with 1/2 shaft with lateral and dorsal displacement of the femoral condyles relative to the femoral shaft. Electronically Signed   By: Onalee Hua  Sterling Big M.D.   On: 08/02/2016 21:23    Microbiology: Recent Results (from the past 240 hour(s))  MRSA PCR Screening     Status: None   Collection Time: 08/24/16  4:59 PM  Result Value Ref Range Status   MRSA by PCR NEGATIVE NEGATIVE Final    Comment:        The GeneXpert MRSA Assay (FDA approved for NASAL specimens only), is one component of a comprehensive MRSA colonization surveillance program. It is not intended to diagnose MRSA infection nor to guide or monitor treatment for MRSA infections.      Labs: Basic Metabolic Panel:  Recent Labs Lab 08/24/16 1224 08/25/16 0512  NA 140 138  K 5.1 4.6  CL 112* 112*  CO2 20* 20*  GLUCOSE 105* 66  BUN 22* 18  CREATININE 1.14* 1.11*  CALCIUM 8.8* 8.6*   Liver Function Tests:  Recent Labs Lab 08/24/16 1224  AST 27  ALT 16  ALKPHOS 123  BILITOT 1.5*  PROT 7.2  ALBUMIN 2.1*   No  results for input(s): LIPASE, AMYLASE in the last 168 hours. No results for input(s): AMMONIA in the last 168 hours. CBC:  Recent Labs Lab 08/24/16 1224 08/24/16 2216  WBC 10.8*  --   HGB 7.2* 8.5*  HCT 23.0* 26.6*  MCV 99.6  --   PLT 147*  --    Cardiac Enzymes: No results for input(s): CKTOTAL, CKMB, CKMBINDEX, TROPONINI in the last 168 hours. BNP: BNP (last 3 results)  Recent Labs  05/03/16 1859 06/02/16 1914 08/02/16 2352  BNP 257.2* 757.9* 500.3*    ProBNP (last 3 results) No results for input(s): PROBNP in the last 8760 hours.  CBG:  Recent Labs Lab 08/25/16 0856  GLUCAP 78    Signed:  Penny Pia MD.  [facility] 08/25/2016, 11:10 AM

## 2016-08-25 NOTE — Progress Notes (Signed)
Clinical Social Worker facilitated patient discharge including contacting patient family and facility to confirm patient discharge plans.  Clinical information faxed to facility and family agreeable with plan.  [facility] .  RN Edgardo RoysGreta to call [phone] for report prior to discharge.  Clinical Social Worker will sign off for now as social work intervention is no longer needed. Please consult us again if new need arises.  Marrianne MoodAshley Dariana Garbett, MSW, [employer]CSWA [phone]

## 2016-08-26 ENCOUNTER — Other Ambulatory Visit: Payer: Self-pay | Admitting: *Deleted

## 2016-08-26 NOTE — Patient Outreach (Signed)
[facility] Care Management  08/26/2016  [name] Argo 11/11/1940 [id number]   Phone call from patient's daughter Elnita MaxwellCheryl, who states that patient's last covered day for skilled care is 09/12/16.  Per Elnita Maxwellheryl, patient has been offered a long term bed however patient's family do not agree on the long term plan. Patient's daughter Santina EvansCatherine,  would like patient to return home for approximately one month to eventually [place]   This social worker recommended that there be a family meeting scheduled with patient, patient's family members and the care team at the SNF to discuss recommendations for care. Patient's daughter agrees with this plan.   Plan:  This social worker will contact the discharge [facility]anner [facility] to request a family meeting.   Adriana ReamsChrystal Zeya Balles, LCSW [facility] Care [facility] [phone]

## 2016-08-26 NOTE — Patient Outreach (Signed)
[facility]) Care Management  08/26/2016  [name] Rosenwald [birth year] [id number]   Phone call to Marcille BuffySholonda Dickerson., [facility].  Recommendation of long term care confirmed. Family meeting requested to discuss patient's progress and recommendations from the treatment team.   Plan: Clent JacksSholonda will schedule the family meeting and will call this social worker back with the date and time .    Adriana ReamsChrystal Land, LCSW [facility] [phone]

## 2016-08-27 LAB — TYPE AND SCREEN
BLOOD PRODUCT EXPIRATION DATE: 201803192359
Blood Product Expiration Date: 201803092359
ISSUE DATE / TIME: 201802171727
UNIT TYPE AND RH: 6200
Unit Type and Rh: 6200

## 2016-08-28 ENCOUNTER — Encounter (INDEPENDENT_AMBULATORY_CARE_PROVIDER_SITE_OTHER): Payer: Self-pay | Admitting: Orthopaedic Surgery

## 2016-08-28 ENCOUNTER — Ambulatory Visit (INDEPENDENT_AMBULATORY_CARE_PROVIDER_SITE_OTHER): Payer: Medicare Other | Admitting: Orthopaedic Surgery

## 2016-08-28 ENCOUNTER — Encounter (INDEPENDENT_AMBULATORY_CARE_PROVIDER_SITE_OTHER): Payer: Self-pay

## 2016-08-28 ENCOUNTER — Ambulatory Visit (INDEPENDENT_AMBULATORY_CARE_PROVIDER_SITE_OTHER): Payer: Medicare Other

## 2016-08-28 DIAGNOSIS — S72452A Displaced supracondylar fracture without intracondylar extension of lower end of left femur, initial encounter for closed fracture: Secondary | ICD-10-CM

## 2016-08-28 DIAGNOSIS — S82832D Other fracture of upper and lower end of left fibula, subsequent encounter for closed fracture with routine healing: Secondary | ICD-10-CM

## 2016-08-28 NOTE — Progress Notes (Signed)
Post-Op Visit Note   Patient: Lisa [name]           Date of Birth: 1941-04-28           MRN: [id number] Visit Date: 08/28/2016 PCP: Laurena Slimmer, MD   Assessment & Plan:  Chief Complaint: No chief complaint on file.  Visit Diagnoses:  1. Closed fracture of distal end of left fibula with routine healing, unspecified fracture morphology, subsequent encounter   2. Closed displaced supracondylar fracture of distal end of left femur without intracondylar extension, initial encounter [facility])     Plan:  Patient will follow the office in 3 weeks for recheck. Continue PT. Nonweightbearing left lower extremity.  Follow-Up Instructions: Return in about 3 weeks (around 09/18/2016).   Orders:  Orders Placed This Encounter  Procedures  . XR FEMUR MIN 2 VIEWS LEFT  . XR Tibia/Fibula Left   No orders of the defined types were placed in this encounter.  HPI Patient presents for first follow up status post ORIF and External Fixator Left Femur 08/05/2016. She also has distal fubula fracture on left. She states that her entire leg hurts all of the time. She is nonweightbearing. She currently resides at [facility].   Imaging: Xr Femur Min 2 Views Left  Result Date: 08/28/2016 X-ray femur show satisfactory alignment. Hardware intact after ORIF.  Xr Tibia/fibula Left  Result Date: 08/28/2016 X-ray tib-fib shows good bony anatomy. No acute change. No signs of fracture.   PMFS History: Patient Active Problem List   Diagnosis Date Noted  . Anemia 08/24/2016  . Pressure injury of skin 08/24/2016  . Hypertensive heart disease   . CAD (coronary artery disease) 08/02/2016  . Femur fracture, left (HCC) 08/02/2016  . Fibula fracture 08/02/2016  . Coagulopathy (HCC) 08/02/2016  . DNR (do not resuscitate) discussion   . Chronic anticoagulation 06/07/2016  . Anemia, normocytic normochromic 06/07/2016  . Acute on chronic renal insufficiency 06/07/2016  . Chronic atrial  fibrillation (HCC)   . Pressure sore 06/06/2016  . Chronic respiratory failure with hypoxia (HCC) 06/03/2016  . Acute renal failure (ARF) (HCC)   . PVC (premature ventricular contraction) 03/22/2014  . Chronic diastolic heart failure (HCC) 05/06/2013  . OSA (obstructive sleep apnea) 05/06/2013  . Morbid obesity (HCC)   . Hyperlipidemia   . GERD (gastroesophageal reflux disease) 10/27/2010  . Gout 10/27/2010  . Hypertension 10/27/2010  . Asthma 10/27/2010  . Diabetes mellitus (HCC) 10/27/2010   Past Medical History:  Diagnosis Date  . Chronic diastolic CHF (congestive heart failure) (HCC)   . Coronary artery disease    s/p CABG  . Diabetes mellitus without complication (HCC)   . Hyperlipidemia   . Hypertension   . Morbid obesity (HCC)   . OSA (obstructive sleep apnea)    mild OSA with AHI 7.15 now on CPAP at 16cm H2O  . RBBB     Family History  Problem Relation Age of Onset  . Heart attack Mother   . Heart disease Mother   . Stomach cancer Father     Past Surgical History:  Procedure Laterality Date  . BACK SURGERY    . CARDIOVERSION N/A 08/03/2015   Procedure: CARDIOVERSION;  Surgeon: Vesta Mixer, MD;  Location: [facility] ENDOSCOPY;  Service: Cardiovascular;  Laterality: N/A;  . CORONARY ANGIOPLASTY WITH STENT PLACEMENT    . CORONARY ARTERY BYPASS GRAFT    . CYSTOSCOPY W/ URETERAL STENT PLACEMENT Left 08/05/2016   Procedure: CYSTOSCOPY WITH RETROGRADE PYELOGRAM/URETERAL STENT  PLACEMENT;  Surgeon: Marcine MatarStephen Dahlstedt, MD;  [facility];  Service: Urology;  Laterality: Left;  . EXTERNAL FIXATION LEG Left 08/05/2016   Procedure: EXTERNAL FIXATION FEMUR, ORIF FEMUR;  Surgeon: Eldred MangesMark C Yates, MD;  Location: [facility] OR;  Service: Orthopedics;  Laterality: Left;  . KNEE SURGERY Bilateral   . ORIF ANKLE FRACTURE Right 08/19/2014   Procedure: OPEN REDUCTION INTERNAL FIXATION (ORIF) ANKLE FRACTURE;  Surgeon: Cammy CopaGregory Scott Dean, MD;  [facility];  Service: Orthopedics;  Laterality: Right;    . rotator cuff surgery     Social History   Occupational History  . Not on file.   Social History Main Topics  . Smoking status: Former Games developermoker  . Smokeless tobacco: Never Used  . Alcohol use No  . Drug use: No  . Sexual activity: No   Exam: Today Staples removed and structures 5. Incisions healing well without signs of infection. No drainage.

## 2016-08-29 ENCOUNTER — Other Ambulatory Visit: Payer: Self-pay | Admitting: *Deleted

## 2016-08-29 ENCOUNTER — Encounter: Payer: Self-pay | Admitting: *Deleted

## 2016-08-29 NOTE — Patient Outreach (Signed)
[facility]) Care Management  08/29/2016  [name] Rieves [birth year] [id number]   Phone call to patient's daughter Elnita MaxwellCheryl on [facility] to follow up on family meeting to occur at the SNF to discuss patient's long term plan before discharge.  Discharge date set for 09/12/16.  Per Elnita Maxwellheryl, she has not been contacted to set up meeting.   Phone call to [facility]erson [facility] to follow up on Family Meeting that was to be scheduled to discuss patient's long term plan.  Voicemail message left for a return call.    Adriana ReamsChrystal Kynslei Art, LCSW [facility] Care [facility] [phone]

## 2016-08-30 ENCOUNTER — Other Ambulatory Visit: Payer: Self-pay | Admitting: *Deleted

## 2016-08-30 NOTE — Patient Outreach (Signed)
[facility]) Care Management  [facility]  08/30/2016  [name] Bankson Mar 01, 1941 [id number]  Subjective:  Patient reports feeling much better today. Reports no pain.  Patient states that [place] once she [place] from the SNF.  She adamantly refused long term care and states that she will be fine at home. Patient states that her family will hopefully provide care along with her personal care ai and home health.  Patient states that her wound on her left leg is improving, facility changes it daily.  Objective:   Encounter Medications:  Outpatient Encounter Prescriptions as of 08/30/2016  Medication Sig Note  . acetaminophen (TYLENOL) 325 MG tablet Take 650 mg by mouth every 4 (four) hours as needed for moderate pain.   Marland Kitchen. amLODipine (NORVASC) 5 MG tablet Take 1 tablet (5 mg total) by mouth daily.   Marland Kitchen. apixaban (ELIQUIS) 2.5 MG TABS tablet Take 1 tablet (2.5 mg total) by mouth 2 (two) times daily.   Marland Kitchen. atorvastatin (LIPITOR) 10 MG tablet Take 1 tablet (10 mg total) by mouth daily at 6 PM.   . budesonide (PULMICORT) 0.5 MG/2ML nebulizer solution Take 4 mLs (1 mg total) by nebulization 2 (two) times daily.   Marland Kitchen. buPROPion (WELLBUTRIN XL) 150 MG 24 hr tablet Take 150 mg by mouth daily. Reported on 08/02/2015   . cholecalciferol (VITAMIN D) 1000 units tablet Take 1,000 Units by mouth daily.   . ferrous sulfate 325 (65 FE) MG tablet Take 325 mg by mouth 3 (three) times daily with meals.   . fluticasone (FLOVENT HFA) 220 MCG/ACT inhaler Inhale 2 puffs into the lungs 2 (two) times daily as needed (shortness of breath/ wheezing).   . hydrALAZINE (APRESOLINE) 25 MG tablet Take 1 tablet (25 mg total) by mouth every 8 (eight) hours.   Marland Kitchen. HYDROcodone-acetaminophen (NORCO/VICODIN) 5-325 MG tablet Take 1-2 tablets by mouth every 6 (six) hours as needed for moderate pain. (Patient taking differently: Take 1 tablet by mouth See admin instructions. Take 1  tablet by mouth every 24 hours as needed for pain. May give 1 tablet by mouth at bedtime as needed for pain.)   . ipratropium-albuterol (DUONEB) 0.5-2.5 (3) MG/3ML SOLN Take 3 mLs by nebulization every 6 (six) hours as needed. Wheezing or shortness of breath. (Patient taking differently: Take 3 mLs by nebulization every 6 (six) hours as needed (shortness of breath/ wheezing). )   . isosorbide mononitrate (IMDUR) 30 MG 24 hr tablet Take 1 tablet (30 mg total) by mouth daily.   . metoprolol succinate (TOPROL-XL) 50 MG 24 hr tablet Take 1 tablet (50 mg total) by mouth daily. Take with or immediately following a meal. 08/03/2016: Family not sure if this is taken in the morning or at night - will bring more information tomorrow  . omeprazole (PRILOSEC) 20 MG capsule Take 20 mg by mouth daily.   Marland Kitchen. oxybutynin (DITROPAN) 5 MG tablet Take 5 mg by mouth 2 (two) times daily.   . OXYGEN Inhale 2 L into the lungs continuous.   . polyethylene glycol (MIRALAX / GLYCOLAX) packet Take 17 g by mouth daily as needed for mild constipation.   Marland Kitchen. PROMETHEGAN 25 MG suppository Place 25 mg rectally daily as needed for nausea or vomiting.    . sodium hypochlorite (DAKIN'S 1/4 STRENGTH) 0.125 % SOLN Irrigate with 1 application as directed 2 (two) times daily.    No facility-administered encounter medications on file as of 08/30/2016.  Functional Status:  In your present state of health, do you have any difficulty performing the following activities: 08/03/2016 06/17/2016  Hearing? N N  Vision? N N  Difficulty concentrating or making decisions? Y N  Walking or climbing stairs? Y Y  Dressing or bathing? Y Y  Doing errands, shopping? Malvin Johns  Preparing Food and eating ? - Y  Using the Toilet? - Y  In the past six months, have you accidently leaked urine? - Y  Do you have problems with loss of bowel control? - Y  Managing your Medications? - N  Managing your Finances? - N  Housekeeping or managing your Housekeeping? - N   Some recent data might be hidden    Fall/Depression Screening:  PHQ 2/9 Scores 06/17/2016 10/26/2015  PHQ - 2 Score 1 6  PHQ- 9 Score - 16    Assessment:  Patient in better spirits today. Patient having lunch when this social worker arrived.  Patient denied being in pain and reports active participation in rehab, however remains non-weight bearing.  Dressings on her wounds changed daily. Patient to confident with amount of help she will receive in the home "I hope my family will help".  Tentative discharge date with [facility] on 09/816, however this social worker unable to confirm  discharge plan as discharge planner was not available at the time of visits.  Family meeting requested to discuss long term plan for patient with caregivers, however date and time pending. Active participation in rehab reinforced as well s follow up with discharge planner to address discharge needs.  Plan:  This social worker will follow up with patient's family and discharge planner regarding patient's long term plan.     Adriana Reams [facility] Care Management [phone]

## 2016-09-02 ENCOUNTER — Other Ambulatory Visit: Payer: Self-pay | Admitting: *Deleted

## 2016-09-02 NOTE — Patient Outreach (Signed)
[facility]) Care Management  09/02/2016  Lisa [name] [birth year] [id number]   Phone call from Lisa [name], [facility] stating that patient will be returning home at discharge. The family has decided against long term care at this time.  Per discharge planner, patient's daughter Lisa [name] will be residing with patient to assist with care.  Patient will discharge with HH, oxygen and a hospital bed.  Patient also has a personal care aid [facility] that will resume post discharge from the SNF. Tentative discharge date set for 09/13/16.    Lisa ReamsChrystal Thedford Bunton, LCSW [facility] Care Management [phone]

## 2016-09-11 ENCOUNTER — Emergency Department (HOSPITAL_COMMUNITY): Payer: Medicare Other

## 2016-09-11 ENCOUNTER — Encounter (HOSPITAL_COMMUNITY): Payer: Self-pay

## 2016-09-11 ENCOUNTER — Inpatient Hospital Stay (HOSPITAL_COMMUNITY)
Admission: EM | Admit: 2016-09-11 | Discharge: 2016-09-24 | DRG: 698 | Disposition: A | Discharge status: Hospice | Payer: Medicare Other | Attending: Internal Medicine | Admitting: Internal Medicine

## 2016-09-11 DIAGNOSIS — Z951 Presence of aortocoronary bypass graft: Secondary | ICD-10-CM

## 2016-09-11 DIAGNOSIS — Z87891 Personal history of nicotine dependence: Secondary | ICD-10-CM

## 2016-09-11 DIAGNOSIS — G9340 Encephalopathy, unspecified: Secondary | ICD-10-CM | POA: Diagnosis not present

## 2016-09-11 DIAGNOSIS — T8389XA Other specified complication of genitourinary prosthetic devices, implants and grafts, initial encounter: Secondary | ICD-10-CM

## 2016-09-11 DIAGNOSIS — M25519 Pain in unspecified shoulder: Secondary | ICD-10-CM | POA: Diagnosis not present

## 2016-09-11 DIAGNOSIS — Z7901 Long term (current) use of anticoagulants: Secondary | ICD-10-CM

## 2016-09-11 DIAGNOSIS — Z79899 Other long term (current) drug therapy: Secondary | ICD-10-CM

## 2016-09-11 DIAGNOSIS — T83511A Infection and inflammatory reaction due to indwelling urethral catheter, initial encounter: Principal | ICD-10-CM | POA: Diagnosis present

## 2016-09-11 DIAGNOSIS — E871 Hypo-osmolality and hyponatremia: Secondary | ICD-10-CM | POA: Diagnosis present

## 2016-09-11 DIAGNOSIS — I4510 Unspecified right bundle-branch block: Secondary | ICD-10-CM | POA: Diagnosis present

## 2016-09-11 DIAGNOSIS — E11649 Type 2 diabetes mellitus with hypoglycemia without coma: Secondary | ICD-10-CM | POA: Diagnosis present

## 2016-09-11 DIAGNOSIS — Z955 Presence of coronary angioplasty implant and graft: Secondary | ICD-10-CM

## 2016-09-11 DIAGNOSIS — R0602 Shortness of breath: Secondary | ICD-10-CM

## 2016-09-11 DIAGNOSIS — N3001 Acute cystitis with hematuria: Secondary | ICD-10-CM | POA: Diagnosis present

## 2016-09-11 DIAGNOSIS — Y846 Urinary catheterization as the cause of abnormal reaction of the patient, or of later complication, without mention of misadventure at the time of the procedure: Secondary | ICD-10-CM | POA: Diagnosis present

## 2016-09-11 DIAGNOSIS — L97229 Non-pressure chronic ulcer of left calf with unspecified severity: Secondary | ICD-10-CM | POA: Diagnosis present

## 2016-09-11 DIAGNOSIS — N179 Acute kidney failure, unspecified: Secondary | ICD-10-CM | POA: Diagnosis present

## 2016-09-11 DIAGNOSIS — I482 Chronic atrial fibrillation, unspecified: Secondary | ICD-10-CM | POA: Diagnosis present

## 2016-09-11 DIAGNOSIS — E1122 Type 2 diabetes mellitus with diabetic chronic kidney disease: Secondary | ICD-10-CM | POA: Diagnosis present

## 2016-09-11 DIAGNOSIS — R627 Adult failure to thrive: Secondary | ICD-10-CM | POA: Diagnosis present

## 2016-09-11 DIAGNOSIS — N183 Chronic kidney disease, stage 3 (moderate): Secondary | ICD-10-CM

## 2016-09-11 DIAGNOSIS — E785 Hyperlipidemia, unspecified: Secondary | ICD-10-CM | POA: Diagnosis present

## 2016-09-11 DIAGNOSIS — G9341 Metabolic encephalopathy: Secondary | ICD-10-CM | POA: Diagnosis present

## 2016-09-11 DIAGNOSIS — Z1621 Resistance to vancomycin: Secondary | ICD-10-CM | POA: Diagnosis present

## 2016-09-11 DIAGNOSIS — E860 Dehydration: Secondary | ICD-10-CM | POA: Diagnosis present

## 2016-09-11 DIAGNOSIS — L8990 Pressure ulcer of unspecified site, unspecified stage: Secondary | ICD-10-CM | POA: Diagnosis present

## 2016-09-11 DIAGNOSIS — R319 Hematuria, unspecified: Secondary | ICD-10-CM

## 2016-09-11 DIAGNOSIS — D649 Anemia, unspecified: Secondary | ICD-10-CM

## 2016-09-11 DIAGNOSIS — E162 Hypoglycemia, unspecified: Secondary | ICD-10-CM

## 2016-09-11 DIAGNOSIS — I2510 Atherosclerotic heart disease of native coronary artery without angina pectoris: Secondary | ICD-10-CM | POA: Diagnosis present

## 2016-09-11 DIAGNOSIS — A491 Streptococcal infection, unspecified site: Secondary | ICD-10-CM | POA: Diagnosis present

## 2016-09-11 DIAGNOSIS — I10 Essential (primary) hypertension: Secondary | ICD-10-CM | POA: Diagnosis present

## 2016-09-11 DIAGNOSIS — I5032 Chronic diastolic (congestive) heart failure: Secondary | ICD-10-CM | POA: Diagnosis present

## 2016-09-11 DIAGNOSIS — B952 Enterococcus as the cause of diseases classified elsewhere: Secondary | ICD-10-CM | POA: Diagnosis present

## 2016-09-11 DIAGNOSIS — D638 Anemia in other chronic diseases classified elsewhere: Secondary | ICD-10-CM | POA: Diagnosis present

## 2016-09-11 DIAGNOSIS — E43 Unspecified severe protein-calorie malnutrition: Secondary | ICD-10-CM | POA: Diagnosis present

## 2016-09-11 DIAGNOSIS — F329 Major depressive disorder, single episode, unspecified: Secondary | ICD-10-CM | POA: Diagnosis present

## 2016-09-11 DIAGNOSIS — Z66 Do not resuscitate: Secondary | ICD-10-CM | POA: Diagnosis not present

## 2016-09-11 DIAGNOSIS — F05 Delirium due to known physiological condition: Secondary | ICD-10-CM | POA: Diagnosis present

## 2016-09-11 DIAGNOSIS — Z7189 Other specified counseling: Secondary | ICD-10-CM

## 2016-09-11 DIAGNOSIS — N184 Chronic kidney disease, stage 4 (severe): Secondary | ICD-10-CM | POA: Diagnosis present

## 2016-09-11 DIAGNOSIS — D509 Iron deficiency anemia, unspecified: Secondary | ICD-10-CM | POA: Diagnosis present

## 2016-09-11 DIAGNOSIS — I130 Hypertensive heart and chronic kidney disease with heart failure and stage 1 through stage 4 chronic kidney disease, or unspecified chronic kidney disease: Secondary | ICD-10-CM | POA: Diagnosis present

## 2016-09-11 DIAGNOSIS — Z515 Encounter for palliative care: Secondary | ICD-10-CM | POA: Diagnosis present

## 2016-09-11 DIAGNOSIS — G4733 Obstructive sleep apnea (adult) (pediatric): Secondary | ICD-10-CM | POA: Diagnosis present

## 2016-09-11 DIAGNOSIS — E7220 Disorder of urea cycle metabolism, unspecified: Secondary | ICD-10-CM | POA: Diagnosis not present

## 2016-09-11 DIAGNOSIS — D696 Thrombocytopenia, unspecified: Secondary | ICD-10-CM | POA: Diagnosis present

## 2016-09-11 DIAGNOSIS — E11622 Type 2 diabetes mellitus with other skin ulcer: Secondary | ICD-10-CM | POA: Diagnosis present

## 2016-09-11 DIAGNOSIS — Z6838 Body mass index (BMI) 38.0-38.9, adult: Secondary | ICD-10-CM

## 2016-09-11 LAB — COMPREHENSIVE METABOLIC PANEL
ALK PHOS: 73 U/L (ref 38–126)
ALT: 8 U/L — AB (ref 14–54)
ANION GAP: 6 (ref 5–15)
AST: 27 U/L (ref 15–41)
Albumin: 1.8 g/dL — ABNORMAL LOW (ref 3.5–5.0)
BILIRUBIN TOTAL: 1 mg/dL (ref 0.3–1.2)
BUN: 33 mg/dL — ABNORMAL HIGH (ref 6–20)
CALCIUM: 8.1 mg/dL — AB (ref 8.9–10.3)
CO2: 19 mmol/L — AB (ref 22–32)
CREATININE: 1.65 mg/dL — AB (ref 0.44–1.00)
Chloride: 108 mmol/L (ref 101–111)
GFR, EST AFRICAN AMERICAN: 33 mL/min — AB (ref >60)
GFR, EST NON AFRICAN AMERICAN: 29 mL/min — AB (ref >60)
Glucose, Bld: 142 mg/dL — ABNORMAL HIGH (ref 65–99)
Potassium: 4.9 mmol/L (ref 3.5–5.1)
SODIUM: 133 mmol/L — AB (ref 135–145)
TOTAL PROTEIN: 5.7 g/dL — AB (ref 6.5–8.1)

## 2016-09-11 LAB — URINALYSIS, ROUTINE W REFLEX MICROSCOPIC
Bilirubin Urine: NEGATIVE
Glucose, UA: 50 mg/dL — AB
KETONES UR: NEGATIVE mg/dL
Nitrite: NEGATIVE
PH: 5 (ref 5.0–8.0)
PROTEIN: 100 mg/dL — AB
Specific Gravity, Urine: 1.015 (ref 1.005–1.030)

## 2016-09-11 LAB — I-STAT CHEM 8, ED
BUN: 37 mg/dL — ABNORMAL HIGH (ref 6–20)
CALCIUM ION: 1.14 mmol/L — AB (ref 1.15–1.40)
CREATININE: 1.7 mg/dL — AB (ref 0.44–1.00)
Chloride: 108 mmol/L (ref 101–111)
GLUCOSE: 137 mg/dL — AB (ref 65–99)
HCT: 21 % — ABNORMAL LOW (ref 36.0–46.0)
HEMOGLOBIN: 7.1 g/dL — AB (ref 12.0–15.0)
POTASSIUM: 5.2 mmol/L — AB (ref 3.5–5.1)
Sodium: 136 mmol/L (ref 135–145)
TCO2: 21 mmol/L (ref 0–100)

## 2016-09-11 LAB — PROTIME-INR
INR: 2.39
PROTHROMBIN TIME: 26.5 s — AB (ref 11.4–15.2)

## 2016-09-11 LAB — DIFFERENTIAL
Basophils Absolute: 0 10*3/uL (ref 0.0–0.1)
Basophils Relative: 0 %
EOS PCT: 2 %
Eosinophils Absolute: 0.1 10*3/uL (ref 0.0–0.7)
LYMPHS ABS: 1.4 10*3/uL (ref 0.7–4.0)
LYMPHS PCT: 25 %
MONO ABS: 0.3 10*3/uL (ref 0.1–1.0)
MONOS PCT: 6 %
NEUTROS ABS: 3.6 10*3/uL (ref 1.7–7.7)
Neutrophils Relative %: 67 %

## 2016-09-11 LAB — POC OCCULT BLOOD, ED: FECAL OCCULT BLD: NEGATIVE

## 2016-09-11 LAB — APTT: aPTT: 45 seconds — ABNORMAL HIGH (ref 24–36)

## 2016-09-11 LAB — CBC
HEMATOCRIT: 22.7 % — AB (ref 36.0–46.0)
Hemoglobin: 7.1 g/dL — ABNORMAL LOW (ref 12.0–15.0)
MCH: 31 pg (ref 26.0–34.0)
MCHC: 31.3 g/dL (ref 30.0–36.0)
MCV: 99.1 fL (ref 78.0–100.0)
PLATELETS: 148 10*3/uL — AB (ref 150–400)
RBC: 2.29 MIL/uL — AB (ref 3.87–5.11)
RDW: 19.2 % — ABNORMAL HIGH (ref 11.5–15.5)
WBC: 5.4 10*3/uL (ref 4.0–10.5)

## 2016-09-11 LAB — I-STAT TROPONIN, ED: Troponin i, poc: 0.01 ng/mL (ref 0.00–0.08)

## 2016-09-11 LAB — CBG MONITORING, ED
GLUCOSE-CAPILLARY: 54 mg/dL — AB (ref 65–99)
GLUCOSE-CAPILLARY: 98 mg/dL (ref 65–99)
Glucose-Capillary: 42 mg/dL — CL (ref 65–99)

## 2016-09-11 MED ORDER — LINEZOLID 600 MG/300ML IV SOLN
600.0000 mg | Freq: Once | INTRAVENOUS | Status: AC
  Administered 2016-09-12: 600 mg via INTRAVENOUS
  Filled 2016-09-11: qty 300

## 2016-09-11 MED ORDER — DEXTROSE 50 % IV SOLN
1.0000 | Freq: Once | INTRAVENOUS | Status: AC
  Administered 2016-09-11: 50 mL via INTRAVENOUS

## 2016-09-11 MED ORDER — CEFTRIAXONE SODIUM 2 G IJ SOLR
2.0000 g | Freq: Once | INTRAMUSCULAR | Status: AC
  Administered 2016-09-12: 2 g via INTRAVENOUS
  Filled 2016-09-11: qty 2

## 2016-09-11 NOTE — ED Notes (Signed)
QNS blood draw for type and screen

## 2016-09-11 NOTE — ED Triage Notes (Signed)
[facility], called out for AMS, LSN was 2141. Upon EMS arrival noticed L sided facial droop and L sided weakness. CBG 32, PTA given D10 then CBG 54, with improved AMS. Pt also has hematuria.

## 2016-09-11 NOTE — ED Provider Notes (Signed)
[facility]-EMERGENCY DEPT Provider Note   CSN: [id number] Arrival date & time: 09/11/16  2204     History   Chief Complaint Chief Complaint  Patient presents with  . Hypoglycemia    HPI Lisa [name] is a 76 y.o. female.  Pt presents to the ED today with altered mental status.  The pt was hypoglycemic by EMS.  They gave pt D10.  The pt's glucose is still low at 42.  EMS did see a facial droop, so they called a code stroke.  The pt is still confused and is unable to give any hx.  Pt does have an indwelling foley that was placed so decubitus ulcers can heal.      Past Medical History:  Diagnosis Date  . Chronic diastolic CHF (congestive heart failure) (HCC)   . Coronary artery disease    s/p CABG  . Diabetes mellitus without complication (HCC)   . Hyperlipidemia   . Hypertension   . Morbid obesity (HCC)   . OSA (obstructive sleep apnea)    mild OSA with AHI 7.15 now on CPAP at 16cm H2O  . RBBB     Patient Active Problem List   Diagnosis Date Noted  . Hypoglycemia 09/12/2016  . Acute cystitis with hematuria   . Acute encephalopathy   . Anemia 08/24/2016  . Pressure injury of skin 08/24/2016  . Hypertensive heart disease   . CAD (coronary artery disease) 08/02/2016  . Femur fracture, left (HCC) 08/02/2016  . Fibula fracture 08/02/2016  . Coagulopathy (HCC) 08/02/2016  . DNR (do not resuscitate) discussion   . Chronic anticoagulation 06/07/2016  . Anemia, normocytic normochromic 06/07/2016  . Acute on chronic renal insufficiency 06/07/2016  . Chronic atrial fibrillation (HCC)   . Pressure sore 06/06/2016  . Chronic respiratory failure with hypoxia (HCC) 06/03/2016  . Acute renal failure (ARF) (HCC)   . PVC (premature ventricular contraction) 03/22/2014  . Chronic diastolic heart failure (HCC) 05/06/2013  . OSA (obstructive sleep apnea) 05/06/2013  . Morbid obesity (HCC)   . Hyperlipidemia   . GERD (gastroesophageal reflux disease) 10/27/2010  . Gout 10/27/2010    . Hypertension 10/27/2010  . Asthma 10/27/2010  . Diabetes mellitus (HCC) 10/27/2010    Past Surgical History:  Procedure Laterality Date  . BACK SURGERY    . CARDIOVERSION N/A 08/03/2015   Procedure: CARDIOVERSION;  Surgeon: Vesta MixerPhilip J [name], MD;  [facility];  Service: Cardiovascular;  Laterality: N/A;  . CORONARY ANGIOPLASTY WITH STENT PLACEMENT    . CORONARY ARTERY BYPASS GRAFT    . CYSTOSCOPY W/ URETERAL STENT PLACEMENT Left 08/05/2016   Procedure: CYSTOSCOPY WITH RETROGRADE PYELOGRAM/URETERAL STENT PLACEMENT;  Surgeon: Marcine MatarStephen Dahlstedt, MD;  [facility];  Service: Urology;  Laterality: Left;  . EXTERNAL FIXATION LEG Left 08/05/2016   Procedure: EXTERNAL FIXATION FEMUR, ORIF FEMUR;  Surgeon: Eldred MangesMark C Yates, MD;  Location: [facility] OR;  Service: Orthopedics;  Laterality: Left;  . KNEE SURGERY Bilateral   . ORIF ANKLE FRACTURE Right 08/19/2014   Procedure: OPEN REDUCTION INTERNAL FIXATION (ORIF) ANKLE FRACTURE;  Surgeon: Cammy CopaGregory Scott Dean, MD;  [facility];  Service: Orthopedics;  Laterality: Right;  . rotator cuff surgery      OB History    No data available       Home Medications    Prior to Admission medications   Medication Sig Start Date End Date Taking? Authorizing Provider  acetaminophen (TYLENOL) 325 MG tablet Take 650 mg by mouth every 4 (four) hours as  needed for moderate pain.   Yes Historical Provider, MD  amLODipine (NORVASC) 5 MG tablet Take 1 tablet (5 mg total) by mouth daily. 09/20/15  Yes Quintella Reichert, MD  apixaban (ELIQUIS) 2.5 MG TABS tablet Take 1 tablet (2.5 mg total) by mouth 2 (two) times daily. 08/10/16  Yes Omair Latif Sheikh, DO  atorvastatin (LIPITOR) 10 MG tablet Take 1 tablet (10 mg total) by mouth daily at 6 PM. 05/08/16  Yes Elease Etienne, MD  budesonide (PULMICORT) 0.5 MG/2ML nebulizer solution Take 4 mLs (1 mg total) by nebulization 2 (two) times daily. 08/10/16  Yes Omair Latif Sheikh, DO  buPROPion (WELLBUTRIN XL) 150 MG 24 hr  tablet Take 150 mg by mouth daily. Reported on 08/02/2015   Yes Historical Provider, MD  cholecalciferol (VITAMIN D) 1000 units tablet Take 1,000 Units by mouth daily.   Yes Historical Provider, MD  collagenase (SANTYL) ointment Apply 1 application topically 2 (two) times daily.   Yes Historical Provider, MD  diphenhydrAMINE (BENADRYL) 25 MG tablet Take 25 mg by mouth every 6 (six) hours as needed for itching.   Yes Historical Provider, MD  ertapenem (INVANZ) 1 g injection Inject 1 g into the muscle daily.   Yes Historical Provider, MD  ferrous sulfate 325 (65 FE) MG tablet Take 325 mg by mouth 3 (three) times daily with meals.   Yes Historical Provider, MD  fluticasone (FLOVENT HFA) 220 MCG/ACT inhaler Inhale 2 puffs into the lungs 2 (two) times daily as needed (shortness of breath/ wheezing).   Yes Historical Provider, MD  furosemide (LASIX) 20 MG tablet Take 20 mg by mouth daily.   Yes Historical Provider, MD  hydrALAZINE (APRESOLINE) 25 MG tablet Take 1 tablet (25 mg total) by mouth every 8 (eight) hours. 06/13/16  Yes Rhetta Mura, MD  HYDROcodone-acetaminophen (NORCO/VICODIN) 5-325 MG tablet Take 1-2 tablets by mouth every 6 (six) hours as needed for moderate pain. 08/09/16  Yes Naida Sleight, PA-C  ipratropium-albuterol (DUONEB) 0.5-2.5 (3) MG/3ML SOLN Take 3 mLs by nebulization every 6 (six) hours as needed. Wheezing or shortness of breath. Patient taking differently: Take 3 mLs by nebulization every 6 (six) hours as needed (shortness of breath/ wheezing).  05/08/16  Yes Elease Etienne, MD  isosorbide mononitrate (IMDUR) 30 MG 24 hr tablet Take 1 tablet (30 mg total) by mouth daily. 06/13/16  Yes Rhetta Mura, MD  metoprolol succinate (TOPROL-XL) 50 MG 24 hr tablet Take 1 tablet (50 mg total) by mouth daily. Take with or immediately following a meal. 06/13/16  Yes Rhetta Mura, MD  omeprazole (PRILOSEC) 20 MG capsule Take 20 mg by mouth daily. 07/28/16  Yes Historical Provider,  MD  oxybutynin (DITROPAN) 5 MG tablet Take 5 mg by mouth 2 (two) times daily.   Yes Historical Provider, MD  polyethylene glycol (MIRALAX / GLYCOLAX) packet Take 17 g by mouth daily as needed for mild constipation. 08/10/16  Yes Omair Latif Sheikh, DO  PROMETHEGAN 25 MG suppository Place 25 mg rectally daily as needed for nausea or vomiting.  07/28/16  Yes Historical Provider, MD  Skin Protectants, Misc. (EUCERIN) cream Apply 1 application topically daily.   Yes Historical Provider, MD  sodium hypochlorite (DAKIN'S 1/4 STRENGTH) 0.125 % SOLN Irrigate with 1 application as directed 2 (two) times daily.   Yes Historical Provider, MD  OXYGEN Inhale 2 L into the lungs continuous.    Historical Provider, MD    Family History Family History  Problem Relation Age of Onset  .  Heart attack Mother   . Heart disease Mother   . Stomach cancer Father     Social History Social History  Substance Use Topics  . Smoking status: Former Games developer  . Smokeless tobacco: Never Used  . Alcohol use No     Allergies   Patient has no known allergies.   Review of Systems Review of Systems  Unable to perform ROS: Mental status change     Physical Exam Updated Vital Signs BP 117/64 (BP Location: Right Arm)   Pulse 84   Temp 97.4 F (36.3 C) (Axillary)   Resp 18   Ht 5\' 5"  (1.651 m)   Wt 229 lb 15 oz (104.3 kg)   LMP  (LMP Unknown)   SpO2 100%   BMI 38.26 kg/m   Physical Exam  Constitutional: She appears well-developed and well-nourished.  HENT:  Head: Normocephalic and atraumatic.  Right Ear: External ear normal.  Left Ear: External ear normal.  Nose: Nose normal.  Mouth/Throat: Mucous membranes are dry.  Eyes: Pupils are equal, round, and reactive to light.  Neck: Normal range of motion. Neck supple.  Cardiovascular: Normal rate, regular rhythm, normal heart sounds and intact distal pulses.   Pulmonary/Chest: Effort normal and breath sounds normal.  Abdominal: Soft. Bowel sounds are  normal.  Genitourinary:  Genitourinary Comments: Indwelling foley with brownish urine  Musculoskeletal: Normal range of motion.  Neurological: She is alert.  Pt will move all 4 extremities.  She is not speaking or answering questions.  Skin: Skin is warm.     Nursing note and vitals reviewed.    ED Treatments / Results  Labs (all labs ordered are listed, but only abnormal results are displayed) Labs Reviewed  URINE CULTURE - Abnormal; Notable for the following:       Result Value   Culture   (*)    Value: >=100,000 COLONIES/mL VANCOMYCIN RESISTANT ENTEROCOCCUS   Organism ID, Bacteria VANCOMYCIN RESISTANT ENTEROCOCCUS (*)    All other components within normal limits  CULTURE, BLOOD (ROUTINE X 2) - Abnormal; Notable for the following:    Culture ENTEROCOCCUS SPECIES (*)    All other components within normal limits  PROTIME-INR - Abnormal; Notable for the following:    Prothrombin Time 26.5 (*)    All other components within normal limits  APTT - Abnormal; Notable for the following:    aPTT 45 (*)    All other components within normal limits  CBC - Abnormal; Notable for the following:    RBC 2.29 (*)    Hemoglobin 7.1 (*)    HCT 22.7 (*)    RDW 19.2 (*)    Platelets 148 (*)    All other components within normal limits  COMPREHENSIVE METABOLIC PANEL - Abnormal; Notable for the following:    Sodium 133 (*)    CO2 19 (*)    Glucose, Bld 142 (*)    BUN 33 (*)    Creatinine, Ser 1.65 (*)    Calcium 8.1 (*)    Total Protein 5.7 (*)    Albumin 1.8 (*)    ALT 8 (*)    GFR calc non Af Amer 29 (*)    GFR calc Af Amer 33 (*)    All other components within normal limits  URINALYSIS, ROUTINE W REFLEX MICROSCOPIC - Abnormal; Notable for the following:    Color, Urine BROWN (*)    APPearance TURBID (*)    Glucose, UA 50 (*)    Hgb urine dipstick MODERATE (*)  Protein, ur 100 (*)    Leukocytes, UA SMALL (*)    Bacteria, UA FEW (*)    Squamous Epithelial / LPF 6-30 (*)     All other components within normal limits  CBC - Abnormal; Notable for the following:    RBC 2.41 (*)    Hemoglobin 7.5 (*)    HCT 24.0 (*)    RDW 19.3 (*)    All other components within normal limits  BASIC METABOLIC PANEL - Abnormal; Notable for the following:    CO2 18 (*)    BUN 29 (*)    Creatinine, Ser 1.65 (*)    Calcium 8.3 (*)    GFR calc non Af Amer 29 (*)    GFR calc Af Amer 33 (*)    All other components within normal limits  T4, FREE - Abnormal; Notable for the following:    Free T4 1.42 (*)    All other components within normal limits  BASIC METABOLIC PANEL - Abnormal; Notable for the following:    CO2 21 (*)    BUN 27 (*)    Creatinine, Ser 1.53 (*)    Calcium 8.4 (*)    GFR calc non Af Amer 32 (*)    GFR calc Af Amer 37 (*)    All other components within normal limits  CBC - Abnormal; Notable for the following:    RBC 2.96 (*)    Hemoglobin 8.5 (*)    HCT 27.1 (*)    RDW 25.9 (*)    All other components within normal limits  BASIC METABOLIC PANEL - Abnormal; Notable for the following:    Sodium 134 (*)    CO2 20 (*)    BUN 25 (*)    Creatinine, Ser 1.40 (*)    Calcium 8.3 (*)    GFR calc non Af Amer 35 (*)    GFR calc Af Amer 41 (*)    All other components within normal limits  CBG MONITORING, ED - Abnormal; Notable for the following:    Glucose-Capillary 54 (*)    All other components within normal limits  I-STAT CHEM 8, ED - Abnormal; Notable for the following:    Potassium 5.2 (*)    BUN 37 (*)    Creatinine, Ser 1.70 (*)    Glucose, Bld 137 (*)    Calcium, Ion 1.14 (*)    Hemoglobin 7.1 (*)    HCT 21.0 (*)    All other components within normal limits  CBG MONITORING, ED - Abnormal; Notable for the following:    Glucose-Capillary 42 (*)    All other components within normal limits  CULTURE, BLOOD (ROUTINE X 2)  BLOOD CULTURE ID PANEL (REFLEXED)  DIFFERENTIAL  GLUCOSE, CAPILLARY  GLUCOSE, CAPILLARY  GLUCOSE, CAPILLARY  TSH  CORTISOL   GLUCOSE, CAPILLARY  GLUCOSE, CAPILLARY  GLUCOSE, CAPILLARY  GLUCOSE, CAPILLARY  SAVE SMEAR  GLUCOSE, CAPILLARY  GLUCOSE, CAPILLARY  GLUCOSE, CAPILLARY  GLUCOSE, CAPILLARY  GLUCOSE, CAPILLARY  GLUCOSE, CAPILLARY  CBC  I-STAT TROPOININ, ED  CBG MONITORING, ED  POC OCCULT BLOOD, ED  CBG MONITORING, ED  CBG MONITORING, ED  CBG MONITORING, ED  CBG MONITORING, ED  TYPE AND SCREEN  PREPARE RBC (CROSSMATCH)    EKG  EKG Interpretation  Date/Time:  Wednesday September 11 2016 22:28:18 EST Ventricular Rate:  77 PR Interval:    QRS Duration: 164 QT Interval:  461 QTC Calculation: 522 R Axis:   -71 Text Interpretation:  Sinus rhythm Multiform  ventricular premature complexes Short PR interval RBBB and LAFB No significant change since last tracing Confirmed by [facility] MD, Hawley Michel ([id number]) on 09/11/2016 10:36:06 PM Also confirmed by [facility] MD, Alexus Galka ([id number]), editor Misty Stanley ([id number])  on 09/12/2016 7:47:57 AM       Radiology No results found.  Procedures Procedures (including critical care time)  Medications Ordered in ED Medications  amLODipine (NORVASC) tablet 5 mg (5 mg Oral Given 09/13/16 0959)  atorvastatin (LIPITOR) tablet 10 mg (10 mg Oral Given 09/13/16 1739)  budesonide (PULMICORT) nebulizer solution 1 mg (1 mg Nebulization Given 09/13/16 2038)  cholecalciferol (VITAMIN D) tablet 1,000 Units (1,000 Units Oral Given 09/13/16 0959)  diphenhydrAMINE (BENADRYL) capsule 25 mg (not administered)  ferrous sulfate tablet 325 mg (325 mg Oral Given 09/13/16 1739)  metoprolol succinate (TOPROL-XL) 24 hr tablet 50 mg (50 mg Oral Given 09/13/16 1008)  isosorbide mononitrate (IMDUR) 24 hr tablet 30 mg (30 mg Oral Given 09/13/16 0959)  oxybutynin (DITROPAN) tablet 5 mg (5 mg Oral Given 09/13/16 2228)  pantoprazole (PROTONIX) EC tablet 40 mg (40 mg Oral Given 09/13/16 0959)  polyethylene glycol (MIRALAX / GLYCOLAX) packet 17 g (not administered)  HYDROcodone-acetaminophen (NORCO/VICODIN) 5-325  MG per tablet 1-2 tablet (2 tablets Oral Given 09/14/16 0532)  hydrALAZINE (APRESOLINE) tablet 25 mg (25 mg Oral Given 09/14/16 0532)  acetaminophen (TYLENOL) tablet 650 mg (not administered)  ipratropium-albuterol (DUONEB) 0.5-2.5 (3) MG/3ML nebulizer solution 3 mL (not administered)  dextrose 5 %-0.45 % sodium chloride infusion ( Intravenous New Bag/Given 09/13/16 1619)  collagenase (SANTYL) ointment ( Topical Given 09/13/16 1003)  MEDLINE mouth rinse (15 mLs Mouth Rinse Given 09/13/16 2200)  linezolid (ZYVOX) tablet 600 mg (600 mg Oral Given 09/13/16 2227)  apixaban (ELIQUIS) tablet 2.5 mg (2.5 mg Oral Given 09/13/16 2227)  dextrose 50 % solution 50 mL (50 mLs Intravenous Given 09/11/16 2223)  cefTRIAXone (ROCEPHIN) 2 g in dextrose 5 % 50 mL IVPB (0 g Intravenous Stopped 09/12/16 0253)  linezolid (ZYVOX) IVPB 600 mg (0 mg Intravenous Stopped 09/12/16 0253)  dextrose 50 % solution (50 mLs  Given 09/12/16 0023)  0.9 %  sodium chloride infusion ( Intravenous New Bag/Given 09/12/16 0256)  linezolid (ZYVOX) IVPB 600 mg (600 mg Intravenous Given 09/13/16 0246)     Initial Impression / Assessment and Plan / ED Course  I have reviewed the triage vital signs and the nursing notes.  Pertinent labs & imaging results that were available during my care of the patient were reviewed by me and considered in my medical decision making (see chart for details).    Pt is more alert.  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  Last culture only sensitive to linezolid.  The pt given a dose of that as well as rocephin.  The pt is anemic.  Guaiac is negative.  Urine is full of blood and she's on eliquis.  Pt d/w hospitalists for admission.  CRITICAL CARE Performed by: Jacalyn Lefevre   Total critical care time: 30 minutes  Critical care time was exclusive of separately billable procedures and treating other patients.  Critical care was necessary to treat or prevent imminent or life-threatening  deterioration.  Critical care was time spent personally by me on the following activities: development of treatment plan with patient and/or surrogate as well as nursing, discussions with consultants, evaluation of patient's response to treatment, examination of patient, obtaining history from patient or surrogate, ordering and performing treatments and interventions, ordering and review of  laboratory studies, ordering and review of radiographic studies, pulse oximetry and re-evaluation of patient's condition.  Final Clinical Impressions(s) / ED Diagnoses   Final diagnoses:  Hypoglycemia  Acute cystitis with hematuria  Anemia, unspecified type  Anticoagulated  Acute renal failure superimposed on stage 3 chronic kidney disease, unspecified acute renal failure type (HCC)  Calcification of indwelling Foley catheter, initial encounter [facility])    New Prescriptions Current Discharge Medication List       Jacalyn Lefevre, MD 09/14/16 [phone]

## 2016-09-11 NOTE — Consult Note (Addendum)
NEURO HOSPITALIST CONSULT NOTE   Requestig physician: Dr. Particia Nearing  Reason for Consult: AMS  History obtained from:  Chart, Patient and Staff  HPI:                                                                                                                                          Lisa [name] is an 76 y.o. female who presented via EMS for altered mental status. LKN was 2141. On EMS arrival her CBG was 32. Also with left sided facial droop and left sided weakness. CBG improved to 54 concomitant with improved AMS after D10 infusion started. She was noted to have hematuria. In ED, patient states that she feels hypoglycemic. Repeat CBG of 42 and patient subsequently became less responsive to questions. Of note, hematuria was observed by EMS. The patient is anticoagulated with apixaban.   Past Medical History:  Diagnosis Date  . Chronic diastolic CHF (congestive heart failure) (HCC)   . Coronary artery disease    s/p CABG  . Diabetes mellitus without complication (HCC)   . Hyperlipidemia   . Hypertension   . Morbid obesity (HCC)   . OSA (obstructive sleep apnea)    mild OSA with AHI 7.15 now on CPAP at 16cm H2O  . RBBB     Past Surgical History:  Procedure Laterality Date  . BACK SURGERY    . CARDIOVERSION N/A 08/03/2015   Procedure: CARDIOVERSION;  Surgeon: Vesta Mixer, MD;  Location: [facility] ENDOSCOPY;  Service: Cardiovascular;  Laterality: N/A;  . CORONARY ANGIOPLASTY WITH STENT PLACEMENT    . CORONARY ARTERY BYPASS GRAFT    . CYSTOSCOPY W/ URETERAL STENT PLACEMENT Left 08/05/2016   Procedure: CYSTOSCOPY WITH RETROGRADE PYELOGRAM/URETERAL STENT PLACEMENT;  Surgeon: Marcine Matar, MD;  Location: [facility] OR;  Service: Urology;  Laterality: Left;  . EXTERNAL FIXATION LEG Left 08/05/2016   Procedure: EXTERNAL FIXATION FEMUR, ORIF FEMUR;  Surgeon: Eldred Manges, MD;  Location: [facility] OR;  Service: Orthopedics;  Laterality: Left;  . KNEE SURGERY Bilateral   . ORIF  ANKLE FRACTURE Right 08/19/2014   Procedure: OPEN REDUCTION INTERNAL FIXATION (ORIF) ANKLE FRACTURE;  Surgeon: Cammy Copa, MD;  Location: [facility] OR;  Service: Orthopedics;  Laterality: Right;  . rotator cuff surgery      Family History  Problem Relation Age of Onset  . Heart attack Mother   . Heart disease Mother   . Stomach cancer Father     Social History:  reports that she has quit smoking. She has never used smokeless tobacco. She reports that she does not drink alcohol or use drugs.  No Known Allergies  MEDICATIONS:  acetaminophen (TYLENOL) 325 MG tablet Take 650 mg by mouth every 4 (four) hours as needed for moderate pain. Historical Provider, MD Needs Review  amLODipine (NORVASC) 5 MG tablet Take 1 tablet (5 mg total) by mouth daily. Quintella Reichert, MD Needs Review  apixaban (ELIQUIS) 2.5 MG TABS tablet Take 1 tablet (2.5 mg total) by mouth 2 (two) times daily. Merlene Laughter, DO Needs Review  atorvastatin (LIPITOR) 10 MG tablet Take 1 tablet (10 mg total) by mouth daily at 6 PM. Elease Etienne, MD Needs Review  budesonide (PULMICORT) 0.5 MG/2ML nebulizer solution Take 4 mLs (1 mg total) by nebulization 2 (two) times daily. Merlene Laughter, DO Needs Review  buPROPion (WELLBUTRIN XL) 150 MG 24 hr tablet Take 150 mg by mouth daily. Reported on 08/02/2015 Historical Provider, MD Needs Review  cholecalciferol (VITAMIN D) 1000 units tablet Take 1,000 Units by mouth daily. Historical Provider, MD Needs Review  ferrous sulfate 325 (65 FE) MG tablet Take 325 mg by mouth 3 (three) times daily with meals. Historical Provider, MD Needs Review  fluticasone (FLOVENT HFA) 220 MCG/ACT inhaler Inhale 2 puffs into the lungs 2 (two) times daily as needed (shortness of breath/ wheezing). Historical Provider, MD Needs Review  hydrALAZINE (APRESOLINE) 25 MG tablet Take 1 tablet  (25 mg total) by mouth every 8 (eight) hours. Rhetta Mura, MD Needs Review  HYDROcodone-acetaminophen (NORCO/VICODIN) 5-325 MG tablet Take 1-2 tablets by mouth every 6 (six) hours as needed for moderate pain. Naida Sleight, PA-C Needs Review   Patient taking differently: Take 1 tablet by mouth See admin instructions. Take 1 tablet by mouth every 24 hours as needed for pain. May give 1 tablet by mouth at bedtime as needed for pain.    ipratropium-albuterol (DUONEB) 0.5-2.5 (3) MG/3ML SOLN Take 3 mLs by nebulization every 6 (six) hours as needed. Wheezing or shortness of breath. Elease Etienne, MD Needs Review   Patient taking differently: Take 3 mLs by nebulization every 6 (six) hours as needed (shortness of breath/ wheezing).     isosorbide mononitrate (IMDUR) 30 MG 24 hr tablet Take 1 tablet (30 mg total) by mouth daily. Rhetta Mura, MD Needs Review  metoprolol succinate (TOPROL-XL) 50 MG 24 hr tablet Take 1 tablet (50 mg total) by mouth daily. Take with or immediately following a meal. Rhetta Mura, MD Needs Review  omeprazole (PRILOSEC) 20 MG capsule Take 20 mg by mouth daily. Historical Provider, MD Needs Review  oxybutynin (DITROPAN) 5 MG tablet Take 5 mg by mouth 2 (two) times daily. Historical Provider, MD Needs Review  OXYGEN Inhale 2 L into the lungs continuous. Historical Provider, MD Needs Review  polyethylene glycol (MIRALAX / GLYCOLAX) packet Take 17 g by mouth daily as needed for mild constipation. Merlene Laughter, DO Needs Review  PROMETHEGAN 25 MG suppository Place 25 mg rectally daily as needed for nausea or vomiting.  Historical Provider, MD Needs Review  sodium hypochlorite (DAKIN'S 1/4 STRENGTH) 0.125 % SOLN Irrigate with 1 application as directed 2 (two) times daily. Historical Provider, MD Needs Review    ROS:  Unable to  obtain detailed ROS due to AMS. Patient states that she feels hypoglycemic. States that the back of her right hand hurts where IV was placed.   There were no vitals taken for this visit.  General Examination:                                                                                                      General: Morbidly obese and confused.  HEENT-  Normocephalic/atraumatic.  Lungs- Respirations unlabored.   Neurological Examination (brief evaluation performed at time of CBG which revealed glucose of 42) Confused. Increased latency of verbal responses. Paucity of speech. Does not make eye contact. Follows simple commands. No dysarthria. No definite facial droop. Exotropia noted. Does not follow commands to track visually. Responds affirmatively when sensory stimuli applied all 4 extremities. Wiggles toes bilaterally without asymmetry. Pain limits grip strength. Patient then stops following commands and stares straight ahead (about 1 minute after low CBG value above).    Lab Results: Basic Metabolic Panel: No results for input(s): NA, K, CL, CO2, GLUCOSE, BUN, CREATININE, CALCIUM, MG, PHOS in the last 168 hours.  Liver Function Tests: No results for input(s): AST, ALT, ALKPHOS, BILITOT, PROT, ALBUMIN in the last 168 hours. No results for input(s): LIPASE, AMYLASE in the last 168 hours. No results for input(s): AMMONIA in the last 168 hours.  CBC: No results for input(s): WBC, NEUTROABS, HGB, HCT, MCV, PLT in the last 168 hours.  Cardiac Enzymes: No results for input(s): CKTOTAL, CKMB, CKMBINDEX, TROPONINI in the last 168 hours.  Lipid Panel: No results for input(s): CHOL, TRIG, HDL, CHOLHDL, VLDL, LDLCALC in the last 168 hours.  CBG:  Recent Labs Lab 09/11/16 2207  GLUCAP 54*    Microbiology: Results for orders placed or performed during the hospital encounter of 08/24/16  MRSA PCR Screening     Status: None   Collection Time: 08/24/16  4:59 PM  Result Value Ref Range  Status   MRSA by PCR NEGATIVE NEGATIVE Final    Comment:        The GeneXpert MRSA Assay (FDA approved for NASAL specimens only), is one component of a comprehensive MRSA colonization surveillance program. It is not intended to diagnose MRSA infection nor to guide or monitor treatment for MRSA infections.     Coagulation Studies: No results for input(s): LABPROT, INR in the last 72 hours.  Imaging: CT head reveals no acute hemorrhage or other acute changes. Old lacunar infarcts noted.   Assessment: 1. AMS and diffuse weakness secondary to hypoglycemia.  2. No definite lateralized weakness seen on exam.  3. CT head negative for hemorrhage. Old lacunar infarcts noted.  4. Hypoglycemia can unmask subtle preexisting neurological deficits and appear as acute lateralized weakness or other focal deficit corresponding to old CNS lesions. These presentations are reversible by correction of hypoglycemia. See #3 above regarding old lacunar infarcts.   Recommendations: 1. Correction of hypoglycemia. Repeat neuro check after hypoglycemia is normalized.  2. Assess underlying etiology for hematuria. Of note, she is on oral anticoagulation.  3. Discussed with Dr.  Haviland. Call Neurology if there are new neurological changes not attributable to hypoglycemia.   Electronically signed: Dr. Caryl Pina 09/11/2016, 10:29 PM

## 2016-09-12 ENCOUNTER — Emergency Department (HOSPITAL_COMMUNITY): Payer: Medicare Other

## 2016-09-12 ENCOUNTER — Other Ambulatory Visit: Payer: Self-pay | Admitting: *Deleted

## 2016-09-12 DIAGNOSIS — I482 Chronic atrial fibrillation: Secondary | ICD-10-CM | POA: Diagnosis not present

## 2016-09-12 DIAGNOSIS — Z7901 Long term (current) use of anticoagulants: Secondary | ICD-10-CM

## 2016-09-12 DIAGNOSIS — N189 Chronic kidney disease, unspecified: Secondary | ICD-10-CM

## 2016-09-12 DIAGNOSIS — N3001 Acute cystitis with hematuria: Secondary | ICD-10-CM

## 2016-09-12 DIAGNOSIS — N289 Disorder of kidney and ureter, unspecified: Secondary | ICD-10-CM | POA: Diagnosis not present

## 2016-09-12 DIAGNOSIS — E162 Hypoglycemia, unspecified: Secondary | ICD-10-CM | POA: Diagnosis not present

## 2016-09-12 DIAGNOSIS — D649 Anemia, unspecified: Secondary | ICD-10-CM | POA: Diagnosis not present

## 2016-09-12 DIAGNOSIS — G9340 Encephalopathy, unspecified: Secondary | ICD-10-CM | POA: Diagnosis present

## 2016-09-12 DIAGNOSIS — S72452D Displaced supracondylar fracture without intracondylar extension of lower end of left femur, subsequent encounter for closed fracture with routine healing: Secondary | ICD-10-CM

## 2016-09-12 LAB — CBC
HCT: 24 % — ABNORMAL LOW (ref 36.0–46.0)
HEMOGLOBIN: 7.5 g/dL — AB (ref 12.0–15.0)
MCH: 31.1 pg (ref 26.0–34.0)
MCHC: 31.3 g/dL (ref 30.0–36.0)
MCV: 99.6 fL (ref 78.0–100.0)
PLATELETS: 187 10*3/uL (ref 150–400)
RBC: 2.41 MIL/uL — AB (ref 3.87–5.11)
RDW: 19.3 % — ABNORMAL HIGH (ref 11.5–15.5)
WBC: 6.7 10*3/uL (ref 4.0–10.5)

## 2016-09-12 LAB — GLUCOSE, CAPILLARY
GLUCOSE-CAPILLARY: 68 mg/dL (ref 65–99)
GLUCOSE-CAPILLARY: 82 mg/dL (ref 65–99)
GLUCOSE-CAPILLARY: 80 mg/dL (ref 65–99)
Glucose-Capillary: 70 mg/dL (ref 65–99)
Glucose-Capillary: 79 mg/dL (ref 65–99)

## 2016-09-12 LAB — BASIC METABOLIC PANEL
Anion gap: 8 (ref 5–15)
BUN: 29 mg/dL — ABNORMAL HIGH (ref 6–20)
CHLORIDE: 109 mmol/L (ref 101–111)
CO2: 18 mmol/L — ABNORMAL LOW (ref 22–32)
CREATININE: 1.65 mg/dL — AB (ref 0.44–1.00)
Calcium: 8.3 mg/dL — ABNORMAL LOW (ref 8.9–10.3)
GFR, EST AFRICAN AMERICAN: 33 mL/min — AB (ref >60)
GFR, EST NON AFRICAN AMERICAN: 29 mL/min — AB (ref >60)
Glucose, Bld: 84 mg/dL (ref 65–99)
POTASSIUM: 4.2 mmol/L (ref 3.5–5.1)
SODIUM: 135 mmol/L (ref 135–145)

## 2016-09-12 LAB — CBG MONITORING, ED
GLUCOSE-CAPILLARY: 65 mg/dL (ref 65–99)
Glucose-Capillary: 98 mg/dL (ref 65–99)
Glucose-Capillary: 72 mg/dL (ref 65–99)

## 2016-09-12 LAB — PREPARE RBC (CROSSMATCH)

## 2016-09-12 LAB — T4, FREE: FREE T4: 1.42 ng/dL — AB (ref 0.61–1.12)

## 2016-09-12 LAB — CORTISOL: Cortisol, Plasma: 12.6 ug/dL

## 2016-09-12 LAB — TSH: TSH: 2.368 u[IU]/mL (ref 0.350–4.500)

## 2016-09-12 MED ORDER — SODIUM CHLORIDE 0.9 % IV SOLN
Freq: Once | INTRAVENOUS | Status: AC
  Administered 2016-09-12: 03:00:00 via INTRAVENOUS

## 2016-09-12 MED ORDER — DEXTROSE-NACL 5-0.45 % IV SOLN
INTRAVENOUS | Status: DC
  Administered 2016-09-12 – 2016-09-18 (×9): via INTRAVENOUS
  Administered 2016-09-18: 1000 mL via INTRAVENOUS
  Administered 2016-09-19: 08:00:00 via INTRAVENOUS
  Administered 2016-09-20: 1000 mL via INTRAVENOUS
  Administered 2016-09-22: 02:00:00 via INTRAVENOUS

## 2016-09-12 MED ORDER — BUPROPION HCL ER (XL) 150 MG PO TB24
150.0000 mg | ORAL_TABLET | Freq: Every day | ORAL | Status: DC
  Administered 2016-09-12 – 2016-09-13 (×2): 150 mg via ORAL
  Filled 2016-09-12 (×2): qty 1

## 2016-09-12 MED ORDER — FLUTICASONE PROPIONATE HFA 220 MCG/ACT IN AERO: 2.0000 | INHALATION_SPRAY | Freq: Two times a day (BID) | RESPIRATORY_TRACT | Status: DC | PRN

## 2016-09-12 MED ORDER — METOPROLOL SUCCINATE ER 50 MG PO TB24
50.0000 mg | ORAL_TABLET | Freq: Every day | ORAL | Status: DC
  Administered 2016-09-12 – 2016-09-24 (×9): 50 mg via ORAL
  Filled 2016-09-12 (×13): qty 1

## 2016-09-12 MED ORDER — ACETAMINOPHEN 325 MG PO TABS
650.0000 mg | ORAL_TABLET | ORAL | Status: DC | PRN
  Administered 2016-09-15 – 2016-09-18 (×3): 650 mg via ORAL
  Filled 2016-09-12 (×4): qty 2

## 2016-09-12 MED ORDER — AMLODIPINE BESYLATE 5 MG PO TABS
5.0000 mg | ORAL_TABLET | Freq: Every day | ORAL | Status: DC
  Administered 2016-09-12 – 2016-09-15 (×4): 5 mg via ORAL
  Filled 2016-09-12 (×5): qty 1

## 2016-09-12 MED ORDER — FERROUS SULFATE 325 (65 FE) MG PO TABS
325.0000 mg | ORAL_TABLET | Freq: Three times a day (TID) | ORAL | Status: DC
  Administered 2016-09-12 – 2016-09-24 (×28): 325 mg via ORAL
  Filled 2016-09-12 (×26): qty 1

## 2016-09-12 MED ORDER — COLLAGENASE 250 UNIT/GM EX OINT
1.0000 "application " | TOPICAL_OINTMENT | Freq: Two times a day (BID) | CUTANEOUS | Status: DC
  Filled 2016-09-12: qty 30

## 2016-09-12 MED ORDER — BUDESONIDE 0.5 MG/2ML IN SUSP
1.0000 mg | Freq: Two times a day (BID) | RESPIRATORY_TRACT | Status: DC
  Administered 2016-09-12 – 2016-09-13 (×2): 1 mg via RESPIRATORY_TRACT
  Administered 2016-09-14: 0.5 mg via RESPIRATORY_TRACT
  Administered 2016-09-15 – 2016-09-18 (×8): 1 mg via RESPIRATORY_TRACT
  Administered 2016-09-19: 0.5 mg via RESPIRATORY_TRACT
  Administered 2016-09-19 – 2016-09-24 (×6): 1 mg via RESPIRATORY_TRACT
  Filled 2016-09-12 (×32): qty 4

## 2016-09-12 MED ORDER — COLLAGENASE 250 UNIT/GM EX OINT: TOPICAL_OINTMENT | Freq: Two times a day (BID) | CUTANEOUS | Status: DC

## 2016-09-12 MED ORDER — APIXABAN 2.5 MG PO TABS
2.5000 mg | ORAL_TABLET | Freq: Two times a day (BID) | ORAL | Status: DC
  Administered 2016-09-12: 2.5 mg via ORAL
  Filled 2016-09-12: qty 1

## 2016-09-12 MED ORDER — PANTOPRAZOLE SODIUM 40 MG PO TBEC
40.0000 mg | DELAYED_RELEASE_TABLET | Freq: Every day | ORAL | Status: DC
  Administered 2016-09-12 – 2016-09-24 (×11): 40 mg via ORAL
  Filled 2016-09-12 (×11): qty 1

## 2016-09-12 MED ORDER — HYDROCODONE-ACETAMINOPHEN 5-325 MG PO TABS
1.0000 | ORAL_TABLET | Freq: Four times a day (QID) | ORAL | Status: DC | PRN
Start: 2016-09-12 — End: 2016-09-14
  Administered 2016-09-12 (×2): 2 via ORAL
  Administered 2016-09-13: 1 via ORAL
  Administered 2016-09-13 – 2016-09-14 (×2): 2 via ORAL
  Administered 2016-09-14: 1 via ORAL
  Filled 2016-09-12 (×2): qty 2
  Filled 2016-09-12: qty 1
  Filled 2016-09-12 (×2): qty 2
  Filled 2016-09-12: qty 1

## 2016-09-12 MED ORDER — OXYBUTYNIN CHLORIDE 5 MG PO TABS
5.0000 mg | ORAL_TABLET | Freq: Two times a day (BID) | ORAL | Status: DC
  Administered 2016-09-12 – 2016-09-24 (×22): 5 mg via ORAL
  Filled 2016-09-12 (×23): qty 1

## 2016-09-12 MED ORDER — ISOSORBIDE MONONITRATE ER 30 MG PO TB24
30.0000 mg | ORAL_TABLET | Freq: Every day | ORAL | Status: DC
  Administered 2016-09-12 – 2016-09-15 (×4): 30 mg via ORAL
  Filled 2016-09-12 (×5): qty 1

## 2016-09-12 MED ORDER — DIPHENHYDRAMINE HCL 25 MG PO CAPS
25.0000 mg | ORAL_CAPSULE | Freq: Four times a day (QID) | ORAL | Status: DC | PRN
  Administered 2016-09-14: 25 mg via ORAL
  Filled 2016-09-12: qty 1

## 2016-09-12 MED ORDER — DEXTROSE 50 % IV SOLN
INTRAVENOUS | Status: AC
  Administered 2016-09-12: 50 mL
  Filled 2016-09-12: qty 50

## 2016-09-12 MED ORDER — HYDRALAZINE HCL 25 MG PO TABS
25.0000 mg | ORAL_TABLET | Freq: Three times a day (TID) | ORAL | Status: DC
  Administered 2016-09-12 – 2016-09-15 (×12): 25 mg via ORAL
  Filled 2016-09-12 (×12): qty 1

## 2016-09-12 MED ORDER — VITAMIN D 1000 UNITS PO TABS
1000.0000 [IU] | ORAL_TABLET | Freq: Every day | ORAL | Status: DC
  Administered 2016-09-12 – 2016-09-22 (×9): 1000 [IU] via ORAL
  Filled 2016-09-12 (×9): qty 1

## 2016-09-12 MED ORDER — ATORVASTATIN CALCIUM 10 MG PO TABS
10.0000 mg | ORAL_TABLET | Freq: Every day | ORAL | Status: DC
  Administered 2016-09-12 – 2016-09-21 (×6): 10 mg via ORAL
  Filled 2016-09-12 (×6): qty 1

## 2016-09-12 MED ORDER — IPRATROPIUM-ALBUTEROL 0.5-2.5 (3) MG/3ML IN SOLN
3.0000 mL | Freq: Four times a day (QID) | RESPIRATORY_TRACT | Status: DC | PRN
  Administered 2016-09-21: 3 mL via RESPIRATORY_TRACT
  Filled 2016-09-12: qty 3

## 2016-09-12 MED ORDER — POLYETHYLENE GLYCOL 3350 17 G PO PACK: 17.0000 g | PACK | Freq: Every day | ORAL | Status: DC | PRN

## 2016-09-12 MED ORDER — COLLAGENASE 250 UNIT/GM EX OINT
TOPICAL_OINTMENT | Freq: Every day | CUTANEOUS | Status: DC
  Administered 2016-09-13: 10:00:00 via TOPICAL
  Filled 2016-09-12 (×2): qty 30

## 2016-09-12 NOTE — Discharge Instructions (Addendum)
Thrombocytopenia Thrombocytopenia means that you have a low number of platelets in your blood. Platelets are tiny cells in the blood. When you bleed, they clump together at the cut or injury to stop the bleeding. This is called blood clotting. Not having enough platelets can cause bleeding problems. Follow these instructions at home: General instructions   Check your skin and inside your mouth for bruises or blood as told by your doctor.  Check to see if there is blood in your spit (sputum), pee (urine), and poop (stool). Do this as told by your doctor.  Ask your doctor if you can drink alcohol.  Take over-the-counter and prescription medicines only as told by your doctor.  Tell all of your doctors that you have this condition. Be sure to tell your dentist and eye doctor too. Activity   Do not do activities that can cause bumps or bruises until your doctor says it is okay.  Be careful not to cut yourself:  When you shave.  When you use scissors, needles, knives, or other tools.  Be careful not to burn yourself:  When you use an iron.  When you cook. Contact a doctor if:  You have bruises and you do not know why. Get help right away if:  You are bleeding anywhere on your body.  You have blood in your spit, pee, or poop. This information is not intended to replace advice given to you by your health care provider. Make sure you discuss any questions you have with your health care provider. Document Released: 06/13/2011 Document Revised: 02/25/2016 Document Reviewed: 12/26/2014 Elsevier Interactive Patient Education  2017 [employer]   End-of-Life Care What is end-of-life care? End-of-life care is the physical, emotional, mental, and spiritual care you receive during the days, weeks, or months while you are dying. Your end-of-life care team may include:  Health care providers.  A Child psychotherapistsocial worker.  A spiritual adviser. The goal of end-of-life care is to give you the  highest quality of life possible at the end of your life. What are the different types of end-of-life care? There are several different kinds of care options. Palliative care  This type of care does not treat or cure a disease. The goal is to manage your symptoms. These may include:  Pain.  Constipation.  Nausea. Palliative care teams often also include support for family members and loved ones. You might need palliative care for months or years. Hospice care  This is a kind of palliative care ordered and provided by your health care providers. A hospice care team can also help and support your loved ones. Comfort care  This type of care is for meeting your basic needs and maintaining your overall comfort at the end of your life. This includes caring for your:  Skin.  Breathing.  Nutrition.  Rest.  Temperature. A plan for comfort care can also address the mental, emotional, and spiritual issues that may come up at the end of your life. Where does end-of-life care take place? End-of-life care can take place wherever you are living, as long as you get the care you need. End-of-life care can happen:  At your home.  In a nursing home.  In a hospital.  In a critical care unit. You and your loved ones might be able to decide where end-of-life care takes place. This decision depends on:  Your comfort.  Your wishes.  The medical equipment you need. How do I know when it is time for end-of-life  care? Your health care provider might tell you that there are no more treatments left to try or that treatment can no longer control your illness. Or, you may decide that you do not want to undergo the treatments that are available. Talk to your health care provider and your loved ones about your end-of-life care options. If possible, have this conversation before you need this type of care. Discuss:  How much medical treatment you want during end-of-life care.  Where you would like to  live while you are dying.  What kinds of treatments you would accept to keep you comfortable.  Which treatments you would refuse.  Your faith or spiritual needs at the end of your life.  Who will handle practical details, such as wills and finances. You can create legal documents (advance directives) to let your loved ones know your wishes for end-of-life care. Talk to your health care provider or a lawyer about making a living will that explains your medical wishes. You can also have a medical power of attorney. This designates a person to make health decisions for you if you cannot make them yourself. This information is not intended to replace advice given to you by your health care provider. Make sure you discuss any questions you have with your health care provider. Document Released: 01/19/2014 Document Revised: 06/07/2016 Document Reviewed: 09/17/2013 Elsevier Interactive Patient Education  2017 [employer].

## 2016-09-12 NOTE — Consult Note (Signed)
WOC Nurse wound consult note Reason for Consult: multiple skin issues Patient from SNF.  Patient poor historian.  After review of the chart it appears she had a trauma to the LLE at some point while transferring from bed to wheel chair. She has 2 open areas on the left calf related to this injury.  She is non ambulatory.  She is refusing to allow WOC nurse to assess sacrum/buttocks at this time Wound type: 1. Deep tissue injury: left heel; 3cm x 3cm x 0cm 2. 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 Change daily Hydrogel for moist wound healing to the left medial calf wound, cover with dry dressing. Change daily. Silicone foam heel dressing to the left heel, continue use of heel protector from facility.  Low air loss mattress for pressure redistribution.  WOC will attempt to assess buttocks and sacrum later today or tomorrow.    Discussed POC with patient and bedside nurse.  Re consult if needed, will not follow at this time. Thanks  Tomasita Beevers M.D.C. [employer] MSN, RN,CWOCN, CNS [phone])

## 2016-09-12 NOTE — H&P (Signed)
History and Physical    Lisa [name] UJW:[id number] DOB: 1941/04/20 DOA: 09/11/2016   PCP: [name],[name], MD Chief Complaint:  Chief Complaint  Patient presents with  . Hypoglycemia    HPI: Lisa [name] is a 76 y.o. female with medical history significant of DM, hip fx at end of March, surgically repaired.  That visit was complicated by R sided hydronephrosis for which she had ureteral stent placement.  She was Re-admitted to our service on 2/17 for acute on chronic anemia, getting 1 unit PRBC transfusion for HGB 6.9.  Hemoccult was negative, but patient has been having gross hematuria for several weeks now she and daughter report (including during that admission judging by the UA).  She has been left on eliquis throughout all of this due to recent hip fx surgery.  Today she developed AMS at the NH including stroke like symptoms.  CBG was as low as the 40s when checked.  Patient was given D50 and had rapid and complete neurologic recovery while in the ED.  ED Course: HGB 7.1, UA again shows frank hematruia.  There is question of UTI with small leukocyte esterase and too many to count WBCs and patient is given a dose of rocephin and zyvox (recently had UTI a month ago with VRE that was pan resistant except to zyvox.  Review of Systems: As per HPI otherwise 10 point review of systems negative.    Past Medical History:  Diagnosis Date  . Chronic diastolic CHF (congestive heart failure) (HCC)   . Coronary artery disease    s/p CABG  . Diabetes mellitus without complication (HCC)   . Hyperlipidemia   . Hypertension   . Morbid obesity (HCC)   . OSA (obstructive sleep apnea)    mild OSA with AHI 7.15 now on CPAP at 16cm H2O  . RBBB     Past Surgical History:  Procedure Laterality Date  . BACK SURGERY    . CARDIOVERSION N/A 08/03/2015   Procedure: CARDIOVERSION;  Surgeon: Vesta MixerPhilip J [name], MD;  [facility];  Service: Cardiovascular;  Laterality: N/A;  . CORONARY  ANGIOPLASTY WITH STENT PLACEMENT    . CORONARY ARTERY BYPASS GRAFT    . CYSTOSCOPY W/ URETERAL STENT PLACEMENT Left 08/05/2016   Procedure: CYSTOSCOPY WITH RETROGRADE PYELOGRAM/URETERAL STENT PLACEMENT;  Surgeon: Marcine MatarStephen Dahlstedt, MD;  [facility];  Service: Urology;  Laterality: Left;  . EXTERNAL FIXATION LEG Left 08/05/2016   Procedure: EXTERNAL FIXATION FEMUR, ORIF FEMUR;  Surgeon: Eldred MangesMark C Yates, MD;  Location: [facility] OR;  Service: Orthopedics;  Laterality: Left;  . KNEE SURGERY Bilateral   . ORIF ANKLE FRACTURE Right 08/19/2014   Procedure: OPEN REDUCTION INTERNAL FIXATION (ORIF) ANKLE FRACTURE;  Surgeon: Cammy CopaGregory Scott Dean, MD;  [facility];  Service: Orthopedics;  Laterality: Right;  . rotator cuff surgery       reports that she has quit smoking. She has never used smokeless tobacco. She reports that she does not drink alcohol or use drugs.  No Known Allergies  Family History  Problem Relation Age of Onset  . Heart attack Mother   . Heart disease Mother   . Stomach cancer Father       Prior to Admission medications   Medication Sig Start Date End Date Taking? Authorizing Provider  acetaminophen (TYLENOL) 325 MG tablet Take 650 mg by mouth every 4 (four) hours as needed for moderate pain.   Yes Historical Provider, MD  amLODipine (NORVASC) 5 MG tablet Take 1 tablet (  5 mg total) by mouth daily. 09/20/15  Yes Quintella Reichert, MD  apixaban (ELIQUIS) 2.5 MG TABS tablet Take 1 tablet (2.5 mg total) by mouth 2 (two) times daily. 08/10/16  Yes Omair Latif Sheikh, DO  atorvastatin (LIPITOR) 10 MG tablet Take 1 tablet (10 mg total) by mouth daily at 6 PM. 05/08/16  Yes Elease Etienne, MD  budesonide (PULMICORT) 0.5 MG/2ML nebulizer solution Take 4 mLs (1 mg total) by nebulization 2 (two) times daily. 08/10/16  Yes Omair Latif Sheikh, DO  buPROPion (WELLBUTRIN XL) 150 MG 24 hr tablet Take 150 mg by mouth daily. Reported on 08/02/2015   Yes Historical Provider, MD  cholecalciferol (VITAMIN D)  1000 units tablet Take 1,000 Units by mouth daily.   Yes Historical Provider, MD  collagenase (SANTYL) ointment Apply 1 application topically 2 (two) times daily.   Yes Historical Provider, MD  diphenhydrAMINE (BENADRYL) 25 MG tablet Take 25 mg by mouth every 6 (six) hours as needed for itching.   Yes Historical Provider, MD  ertapenem (INVANZ) 1 g injection Inject 1 g into the muscle daily.   Yes Historical Provider, MD  ferrous sulfate 325 (65 FE) MG tablet Take 325 mg by mouth 3 (three) times daily with meals.   Yes Historical Provider, MD  fluticasone (FLOVENT HFA) 220 MCG/ACT inhaler Inhale 2 puffs into the lungs 2 (two) times daily as needed (shortness of breath/ wheezing).   Yes Historical Provider, MD  furosemide (LASIX) 20 MG tablet Take 20 mg by mouth daily.   Yes Historical Provider, MD  hydrALAZINE (APRESOLINE) 25 MG tablet Take 1 tablet (25 mg total) by mouth every 8 (eight) hours. 06/13/16  Yes Rhetta Mura, MD  HYDROcodone-acetaminophen (NORCO/VICODIN) 5-325 MG tablet Take 1-2 tablets by mouth every 6 (six) hours as needed for moderate pain. 08/09/16  Yes Naida Sleight, PA-C  ipratropium-albuterol (DUONEB) 0.5-2.5 (3) MG/3ML SOLN Take 3 mLs by nebulization every 6 (six) hours as needed. Wheezing or shortness of breath. Patient taking differently: Take 3 mLs by nebulization every 6 (six) hours as needed (shortness of breath/ wheezing).  05/08/16  Yes Elease Etienne, MD  isosorbide mononitrate (IMDUR) 30 MG 24 hr tablet Take 1 tablet (30 mg total) by mouth daily. 06/13/16  Yes Rhetta Mura, MD  metoprolol succinate (TOPROL-XL) 50 MG 24 hr tablet Take 1 tablet (50 mg total) by mouth daily. Take with or immediately following a meal. 06/13/16  Yes Rhetta Mura, MD  omeprazole (PRILOSEC) 20 MG capsule Take 20 mg by mouth daily. 07/28/16  Yes Historical Provider, MD  oxybutynin (DITROPAN) 5 MG tablet Take 5 mg by mouth 2 (two) times daily.   Yes Historical Provider, MD    polyethylene glycol (MIRALAX / GLYCOLAX) packet Take 17 g by mouth daily as needed for mild constipation. 08/10/16  Yes Omair Latif Sheikh, DO  PROMETHEGAN 25 MG suppository Place 25 mg rectally daily as needed for nausea or vomiting.  07/28/16  Yes Historical Provider, MD  Skin Protectants, Misc. (EUCERIN) cream Apply 1 application topically daily.   Yes Historical Provider, MD  sodium hypochlorite (DAKIN'S 1/4 STRENGTH) 0.125 % SOLN Irrigate with 1 application as directed 2 (two) times daily.   Yes Historical Provider, MD  OXYGEN Inhale 2 L into the lungs continuous.    Historical Provider, MD    Physical Exam: Vitals:   09/12/16 0030 09/12/16 0100 09/12/16 0130 09/12/16 0143  BP: 111/91 131/70 119/98   Pulse: 69 78    Resp: 13  19 17   Temp:    98 F (36.7 C)  TempSrc:      SpO2: 100% 100%        Constitutional: NAD, calm, comfortable Eyes: PERRL, lids and conjunctivae normal ENMT: Mucous membranes are moist. Posterior pharynx clear of any exudate or lesions.Normal dentition.  Neck: normal, supple, no masses, no thyromegaly Respiratory: clear to auscultation bilaterally, no wheezing, no crackles. Normal respiratory effort. No accessory muscle use.  Cardiovascular: Regular rate and rhythm, no murmurs / rubs / gallops. No extremity edema. 2+ pedal pulses. No carotid bruits.  Abdomen: no tenderness, no masses palpated. No hepatosplenomegaly. Bowel sounds positive.  Musculoskeletal: no clubbing / cyanosis. No joint deformity upper and lower extremities. Good ROM, no contractures. Normal muscle tone.  Skin: no rashes, lesions, ulcers. No induration Neurologic: CN 2-12 grossly intact. Sensation intact, DTR normal. Strength 5/5 in all 4.  Psychiatric: Normal judgment and insight. Alert and oriented x 3. Normal mood.    Labs on Admission: I have personally reviewed following labs and imaging studies  CBC:  Recent Labs Lab 09/11/16 2234 09/11/16 2240  WBC 5.4  --   NEUTROABS 3.6   --   HGB 7.1* 7.1*  HCT 22.7* 21.0*  MCV 99.1  --   PLT 148*  --    Basic Metabolic Panel:  Recent Labs Lab 09/11/16 2234 09/11/16 2240  NA 133* 136  K 4.9 5.2*  CL 108 108  CO2 19*  --   GLUCOSE 142* 137*  BUN 33* 37*  CREATININE 1.65* 1.70*  CALCIUM 8.1*  --    GFR: CrCl cannot be calculated (Unknown ideal weight.). Liver Function Tests:  Recent Labs Lab 09/11/16 2234  AST 27  ALT 8*  ALKPHOS 73  BILITOT 1.0  PROT 5.7*  ALBUMIN 1.8*   No results for input(s): LIPASE, AMYLASE in the last 168 hours. No results for input(s): AMMONIA in the last 168 hours. Coagulation Profile:  Recent Labs Lab 09/11/16 2234  INR 2.39   Cardiac Enzymes: No results for input(s): CKTOTAL, CKMB, CKMBINDEX, TROPONINI in the last 168 hours. BNP (last 3 results) No results for input(s): PROBNP in the last 8760 hours. HbA1C: No results for input(s): HGBA1C in the last 72 hours. CBG:  Recent Labs Lab 09/11/16 2207 09/11/16 2222 09/11/16 2236 09/12/16 0008 09/12/16 0129  GLUCAP 54* 42* 98 65 98   Lipid Profile: No results for input(s): CHOL, HDL, LDLCALC, TRIG, CHOLHDL, LDLDIRECT in the last 72 hours. Thyroid Function Tests: No results for input(s): TSH, T4TOTAL, FREET4, T3FREE, THYROIDAB in the last 72 hours. Anemia Panel: No results for input(s): VITAMINB12, FOLATE, FERRITIN, TIBC, IRON, RETICCTPCT in the last 72 hours. Urine analysis:    Component Value Date/Time   COLORURINE BROWN (A) 09/11/2016 2305   APPEARANCEUR TURBID (A) 09/11/2016 2305   LABSPEC 1.015 09/11/2016 2305   PHURINE 5.0 09/11/2016 2305   GLUCOSEU 50 (A) 09/11/2016 2305   HGBUR MODERATE (A) 09/11/2016 2305   BILIRUBINUR NEGATIVE 09/11/2016 2305   KETONESUR NEGATIVE 09/11/2016 2305   PROTEINUR 100 (A) 09/11/2016 2305   UROBILINOGEN 1.0 08/20/2014 1130   NITRITE NEGATIVE 09/11/2016 2305   LEUKOCYTESUR SMALL (A) 09/11/2016 2305   Sepsis Labs: @LABRCNTIP (procalcitonin:4,lacticidven:4) )No  results found for this or any previous visit (from the past 240 hour(s)).   Radiological Exams on Admission: Dg Chest Portable 1 View  Result Date: 09/11/2016 CLINICAL DATA:  Mental status change EXAM: PORTABLE CHEST 1 VIEW COMPARISON:  08/02/2016 FINDINGS: Shallow inspiration. Stable curvilinear  scarring. No confluent consolidation. No large effusion. Unchanged cardiomegaly. Normal pulmonary vasculature. IMPRESSION: Stable curvilinear scarring. Unchanged cardiomegaly. No confluent consolidation or large effusion. Electronically Signed   By: Ellery Plunk M.D.   On: 09/11/2016 22:59   Ct Head Code Stroke W/o Cm  Result Date: 09/11/2016 CLINICAL DATA:  Code stroke. 76 y/o F; left-sided droop and weakness. EXAM: CT HEAD WITHOUT CONTRAST TECHNIQUE: Contiguous axial images were obtained from the base of the skull through the vertex without intravenous contrast. COMPARISON:  CT head 10/15/2012. FINDINGS: Brain: No evidence of acute infarction, hemorrhage, hydrocephalus, extra-axial collection or mass lesion/mass effect. Stable foci of hypoattenuation in subcortical and periventricular white matter compatible with microvascular ischemic changes. Stable lucencies in right caudate body, left caudate head, left lentiform nucleus, and left thalamus compatible with chronic lacunar infarcts. Vascular: No hyperdense vessel identified. Extensive calcific atherosclerosis of cavernous and paraclinoid internal carotid arteries. Skull: Normal. Negative for fracture or focal lesion. Sinuses/Orbits: No acute finding. Other: None. ASPECTS [facility] Stroke Program Early CT Score) - Ganglionic level infarction (caudate, lentiform nuclei, internal capsule, insula, M1-M3 cortex): 7 - Supraganglionic infarction (M4-M6 cortex): 3 Total score (0-10 with 10 being normal): 10 IMPRESSION: 1. No acute intracranial abnormality identified. 2. ASPECTS is 10 3. Stable chronic microvascular ischemic changes, parenchymal volume loss, and basal  ganglia chronic lacunar infarcts. These results were called by telephone at the time of interpretation on 09/11/2016 at 10:35 pm to Dr. Charm Barges, who verbally acknowledged these results. Electronically Signed   By: Mitzi Hansen M.D.   On: 09/11/2016 22:35    EKG: Independently reviewed.  Assessment/Plan Principal Problem:   Hypoglycemia Active Problems:   Chronic atrial fibrillation (HCC)   Chronic anticoagulation   Anemia, normocytic normochromic   Acute on chronic renal insufficiency    1. Hypoglycemia - 1. D5 half at 75 cc/hr 2. Q2H CBGs 3. Of note not on any DM meds according to med rec 2. Anemia - acute on chronic, still needs outpatient work up for anemia by Heme-onc 1. Likely worsening due to ongoing hematuria which is gross red hematuria at this point 2. Probably should call Dr. Rica Mote in the morning and see if this is normal this far out post ureteral stent. 3. Likely ongoing due to patient still being on eliquis (due to rehab for hip fx) 4. Will go ahead and transfuse 1 unit PRBC since she is back down to where they transfused her at last admission. 5. Repeat CBC in AM post transfusion 6. Tele monitor 3. Chronic anticoagulation - continue eliquis for A.Fib and recent hip fx 1. As noted above though this may be worsening her hematuria 4. AKI - 1. Renal US ordered to see if there is obstructive hydronephrosis again 2. Repeat BMP in AM 3. IVF: D5 half at 75 4. Holding lasix   DVT prophylaxis: Eliquis Code Status: Full Family Communication: Daughter at bedside Consults called: None Admission status: Admit to obs   Kenora Spayd, Heywood Iles DO [facility] Pager [phone] from 7PM-7AM  If 7AM-7PM, please contact the day physician for the patient [email or web address] Password [email or web address]  09/12/2016, 2:35 AM

## 2016-09-12 NOTE — Significant Event (Signed)
Hypoglycemic Event  CBG: 68  Treatment: 15 GM carbohydrate snack  Symptoms: None  Follow-up CBG: Time: 0713 CBG Result: 70  Possible Reasons for Event: Unknown  Comments/MD notified: Merdis DelayK. Schorr, NP    Truddie CrumbleMack, Chancy Claros K

## 2016-09-12 NOTE — Progress Notes (Addendum)
PROGRESS NOTE                                                                                                                                                                                                             Patient Demographics:    Lisa [name], is a 76 y.o. female, DOB - [birth year], ION:[id number]  Admit date - 09/11/2016   Admitting Physician Hillary Bow, DO  Outpatient Primary MD for the patient is Laurena Slimmer, MD  LOS - 0  Outpatient Specialists:  Dr Lawanna Kobus  Chief Complaint  Patient presents with  . Hypoglycemia       Brief Narrative   76 year old female with history of diabetes mellitus, chronic diastolic CHF, CAD status post CABG, paroxysmal A. fib on eliquis, (s/p cardioversion in 07/2015) OSA on CPAP, hypertension, hyperlipidemia, left femur fracture status post ORIF in 08/05/2016, hydronephrosis requiring cystoscopy with retrograde pyelogram and ureteral stent placement during the same hospitalization. She was readmitted on 2/17 for acute on chronic anemia and received 1 unit PRBC, negative Hemoccult but with gross hematuria for several weeks. Patient was at skilled nursing facility where she was found to be confused with strokelike symptoms. CBG was in the 40s and was given D50. Her neurological function had recovered while in the ED. In the ED her hemoglobin was 7.1 with UA again showing frank hematuria. UA was concerning for UTI. She was given a dose of Rocephin and Zyvox (given recent history of VRE). Admitted to hospitalist service.    Subjective:   Patient seen and examined. Has some confusion.   Assessment  & Plan :    Principal Problem:   Hypoglycemia She is not on any diabetic medications. Check random cortisol. Repeat TSH and free T4. Continue D5. Monitor CBC closely. Monitor for refractory hypoglycemia.  Anemia, acute on chronic Possibly anemia of chronic disease  as per prior evaluation. She is on iron supplement. Hemeoccult was negative during last hospitalization. Ongoing hematuria and patient being on anticoagulation also not improving her symptoms. I have consulted urology who will evaluate for her ongoing hematuria. Renal ultrasound shows decompression of dilated left renal collecting system with the stent. Patient has chronic Foley.    Active Problems:   Chronic atrial fibrillation (HCC) On antioagulation which is rapidly held for her anemia and ongoing hematuria.   Chronic kidney  disease stage III Renal function actually appears better than previously. Continue to monitor    Femur fracture, left (HCC) Status post ORIF,  5 weeks back. PT evaluation.  ? hypothyroidism High TSH with low T4 on prior labs. Recheck thyroid function. Possibly need supplement if low  Essential hypertension Continue amlodipine.  Coronary artery disease Continue metoprolol, Imdur, hydralazine, statin.  Code Status : full code  Family Communication  : None at bedside  Disposition Plan  : Return to skilled nursing facility once improved, possibly in the next 24 hours.  Barriers For Discharge : active symptoms  Consults  :   Urology neurology  Procedures  :  US renal  DVT Prophylaxis  :  - SCDs  Lab Results  Component Value Date   PLT 187 09/12/2016    Antibiotics  :    Anti-infectives    Start     Dose/Rate Route Frequency Ordered Stop   09/12/16 0015  linezolid (ZYVOX) IVPB 600 mg     600 mg 300 mL/hr over 60 Minutes Intravenous  Once 09/11/16 2359 09/12/16 0253   09/12/16 0000  cefTRIAXone (ROCEPHIN) 2 g in dextrose 5 % 50 mL IVPB     2 g 100 mL/hr over 30 Minutes Intravenous  Once 09/11/16 2358 09/12/16 0253        Objective:   Vitals:   09/12/16 1001 09/12/16 1016 09/12/16 1029 09/12/16 1243  BP: 113/64 (!) 104/53 (!) 100/54 121/65  Pulse: 77 70 76 77  Resp: 16 16 16 16   Temp: 98 F (36.7 C) 98 F (36.7 C)  97.5 F (36.4 C)   TempSrc: Oral Oral  Oral  SpO2: 100% 99% 99% 99%  Weight:      Height:        Wt Readings from Last 3 Encounters:  09/12/16 104.3 kg (229 lb 15 oz)  08/25/16 105 kg (231 lb 7.7 oz)  08/10/16 105.2 kg (232 lb)     Intake/Output Summary (Last 24 hours) at 09/12/16 1333 Last data filed at 09/12/16 1235  Gross per 24 hour  Intake          1016.25 ml  Output                0 ml  Net          1016.25 ml     Physical Exam  Gen: not in distress HEENT: Pallor present, moist mucosa, supple neck Chest: clear b/l, no added sounds CVS: S1 and S2 irregular, no murmurs or gallop GI: soft, NT, ND,  Musculoskeletal: warm, no edema CNS: AAOX1-2    Data Review:    CBC  Recent Labs Lab 09/11/16 2234 09/11/16 2240 09/12/16 0550  WBC 5.4  --  6.7  HGB 7.1* 7.1* 7.5*  HCT 22.7* 21.0* 24.0*  PLT 148*  --  187  MCV 99.1  --  99.6  MCH 31.0  --  31.1  MCHC 31.3  --  31.3  RDW 19.2*  --  19.3*  LYMPHSABS 1.4  --   --   MONOABS 0.3  --   --   EOSABS 0.1  --   --   BASOSABS 0.0  --   --     Chemistries   Recent Labs Lab 09/11/16 2234 09/11/16 2240 09/12/16 0550  NA 133* 136 135  K 4.9 5.2* 4.2  CL 108 108 109  CO2 19*  --  18*  GLUCOSE 142* 137* 84  BUN 33* 37* 29*  CREATININE 1.65* 1.70* 1.65*  CALCIUM 8.1*  --  8.3*  AST 27  --   --   ALT 8*  --   --   ALKPHOS 73  --   --   BILITOT 1.0  --   --    ------------------------------------------------------------------------------------------------------------------ No results for input(s): CHOL, HDL, LDLCALC, TRIG, CHOLHDL, LDLDIRECT in the last 72 hours.  Lab Results  Component Value Date   HGBA1C 5.0 05/03/2016   ------------------------------------------------------------------------------------------------------------------ No results for input(s): TSH, T4TOTAL, T3FREE, THYROIDAB in the last 72 hours.  Invalid input(s):  FREET3 ------------------------------------------------------------------------------------------------------------------ No results for input(s): VITAMINB12, FOLATE, FERRITIN, TIBC, IRON, RETICCTPCT in the last 72 hours.  Coagulation profile  Recent Labs Lab 09/11/16 2234  INR 2.39    No results for input(s): DDIMER in the last 72 hours.  Cardiac Enzymes No results for input(s): CKMB, TROPONINI, MYOGLOBIN in the last 168 hours.  Invalid input(s): CK ------------------------------------------------------------------------------------------------------------------    Component Value Date/Time   BNP 500.3 (H) 08/02/2016 2352    Inpatient Medications  Scheduled Meds: . amLODipine  5 mg Oral Daily  . apixaban  2.5 mg Oral BID  . atorvastatin  10 mg Oral q1800  . budesonide  1 mg Nebulization BID  . buPROPion  150 mg Oral Daily  . cholecalciferol  1,000 Units Oral Daily  . collagenase   Topical Daily  . ferrous sulfate  325 mg Oral TID WC  . hydrALAZINE  25 mg Oral Q8H  . isosorbide mononitrate  30 mg Oral Daily  . metoprolol succinate  50 mg Oral Daily  . oxybutynin  5 mg Oral BID  . pantoprazole  40 mg Oral Daily   Continuous Infusions: . dextrose 5 % and 0.45% NaCl 75 mL/hr at 09/12/16 0259   PRN Meds:.acetaminophen, diphenhydrAMINE, HYDROcodone-acetaminophen, ipratropium-albuterol, polyethylene glycol  Micro Results No results found for this or any previous visit (from the past 240 hour(s)).  Radiology Reports US Renal  Result Date: 09/12/2016 CLINICAL DATA:  Hematuria EXAM: RENAL / URINARY TRACT ULTRASOUND COMPLETE COMPARISON:  08/04/2016 CT FINDINGS: Right Kidney: Length: 11.2 cm. There is a simple appearing cyst in the lateral aspect of the mid to upper pole measuring 3.7 x 4.6 x 3.7 cm. An additional 2.7 x 2.1 x 2.2 cm upper pole cortical based cyst is also noted. Mild increase in echogenicity of the renal parenchyma. No obstructive uropathy or nephrolithiasis.  Left Kidney: Length: 10.2 cm. Echogenicity is increased. No nephrolithiasis. There is an caliectasis of the left renal collecting system with what appears to be extent in the region of the renal pelvis. The degree of dilatation has decreased since prior CT. Bladder: Decompressed by Foley catheter. IMPRESSION: 1. Simple appearing right renal cyst. 2. Left-sided caliectasis with interval decompression of dilated left renal collecting system presumably by a stent which is partially visualized on these images. 3. Foley decompressed bladder. 4. Mild echogenicity of both kidneys may reflect medical renal disease. Electronically Signed   By: Tollie Eth M.D.   On: 09/12/2016 03:03   Dg Chest Portable 1 View  Result Date: 09/11/2016 CLINICAL DATA:  Mental status change EXAM: PORTABLE CHEST 1 VIEW COMPARISON:  08/02/2016 FINDINGS: Shallow inspiration. Stable curvilinear scarring. No confluent consolidation. No large effusion. Unchanged cardiomegaly. Normal pulmonary vasculature. IMPRESSION: Stable curvilinear scarring. Unchanged cardiomegaly. No confluent consolidation or large effusion. Electronically Signed   By: Ellery Plunk M.D.   On: 09/11/2016 22:59   Xr Femur Min 2 Views Left  Result Date: 08/28/2016 X-ray femur show  satisfactory alignment. Hardware intact after ORIF.  Xr Tibia/fibula Left  Result Date: 08/28/2016 X-ray tib-fib shows good bony anatomy. No acute change. No signs of fracture.  Ct Head Code Stroke W/o Cm  Result Date: 09/11/2016 CLINICAL DATA:  Code stroke. 76 y/o F; left-sided droop and weakness. EXAM: CT HEAD WITHOUT CONTRAST TECHNIQUE: Contiguous axial images were obtained from the base of the skull through the vertex without intravenous contrast. COMPARISON:  CT head 10/15/2012. FINDINGS: Brain: No evidence of acute infarction, hemorrhage, hydrocephalus, extra-axial collection or mass lesion/mass effect. Stable foci of hypoattenuation in subcortical and periventricular white matter  compatible with microvascular ischemic changes. Stable lucencies in right caudate body, left caudate head, left lentiform nucleus, and left thalamus compatible with chronic lacunar infarcts. Vascular: No hyperdense vessel identified. Extensive calcific atherosclerosis of cavernous and paraclinoid internal carotid arteries. Skull: Normal. Negative for fracture or focal lesion. Sinuses/Orbits: No acute finding. Other: None. ASPECTS [facility]([place] Stroke Program Early CT Score) - Ganglionic level infarction (caudate, lentiform nuclei, internal capsule, insula, M1-M3 cortex): 7 - Supraganglionic infarction (M4-M6 cortex): 3 Total score (0-10 with 10 being normal): 10 IMPRESSION: 1. No acute intracranial abnormality identified. 2. ASPECTS is 10 3. Stable chronic microvascular ischemic changes, parenchymal volume loss, and basal ganglia chronic lacunar infarcts. These results were called by telephone at the time of interpretation on 09/11/2016 at 10:35 pm to Dr. Charm BargesButler, who verbally acknowledged these results. Electronically Signed   By: Mitzi HansenLance  Furusawa-Stratton M.D.   On: 09/11/2016 22:35    Time Spent in minutes  25   Eddie NorthHUNGEL, Lyndsay Talamante M.D on 09/12/2016 at 1:33 PM  Between 7am to 7pm - Pager - [phone]  After 7pm go to [email or web address] - password [email or web address]  [facility] -  Office  [phone]

## 2016-09-12 NOTE — ED Notes (Signed)
Per Dr. Mora Bellmanni, ok to start ABT with one set of blood cultures

## 2016-09-12 NOTE — Consult Note (Signed)
   [facility]   09/12/2016  Lisa [name] [birth year] [id number]    [facility]. [facility] as well who indicates patient really needs long term placement at SNF but family declined long term placement previously. Hopefully, at some point, patient and family  will reconsider long term placement. Nonetheless, [name] was [facility]. Hopefully she will return at discharge. Will follow up. Voicemail left for inpatient RNCM to make aware of above.    Raiford NobleAtika Effie Wahlert, MSN-Ed, RN,BSN [facility] Liaison [phone]

## 2016-09-12 NOTE — Consult Note (Addendum)
Consult: Gross hematuria Requested by: Dr. Dorris Carnes. Dhungel  History of Present Illness: Lisa [name] is a 76 year old African-American female who underwent cystoscopy, left retrograde pyelogram and left ureteral stent placement 08/05/2016 for left UPJ obstruction. This was done in conjunction with repair of her left femur fracture. Her creatinine had climbed to 2.75, but has settled down to 1.65. She is also had issues with anemia.  She was readmitted with acute mental status changes. Urinalysis showed few bacteria, too numerous to count red blood cells into numerous to count white blood cells.  A renal ultrasound was obtained which showed the left collecting system was decompressed and the stent in good position. Urine appeared discolored and the tubing, but there have been no clots. Per nurse the catheter is been draining well. She is on Linezolid per prior urine cultures. Urine Cx pending.   Past Medical History:  Diagnosis Date  . Chronic diastolic CHF (congestive heart failure) (HCC)   . Coronary artery disease    s/p CABG  . Diabetes mellitus without complication (HCC)   . Hyperlipidemia   . Hypertension   . Morbid obesity (HCC)   . OSA (obstructive sleep apnea)    mild OSA with AHI 7.15 now on CPAP at 16cm H2O  . RBBB    Past Surgical History:  Procedure Laterality Date  . BACK SURGERY    . CARDIOVERSION N/A 08/03/2015   Procedure: CARDIOVERSION;  Surgeon: Vesta Mixer, MD;  Location: [facility] ENDOSCOPY;  Service: Cardiovascular;  Laterality: N/A;  . CORONARY ANGIOPLASTY WITH STENT PLACEMENT    . CORONARY ARTERY BYPASS GRAFT    . CYSTOSCOPY W/ URETERAL STENT PLACEMENT Left 08/05/2016   Procedure: CYSTOSCOPY WITH RETROGRADE PYELOGRAM/URETERAL STENT PLACEMENT;  Surgeon: Marcine Matar, MD;  Location: [facility] OR;  Service: Urology;  Laterality: Left;  . EXTERNAL FIXATION LEG Left 08/05/2016   Procedure: EXTERNAL FIXATION FEMUR, ORIF FEMUR;  Surgeon: Eldred Manges, MD;  Location: [facility] OR;  Service:  Orthopedics;  Laterality: Left;  . KNEE SURGERY Bilateral   . ORIF ANKLE FRACTURE Right 08/19/2014   Procedure: OPEN REDUCTION INTERNAL FIXATION (ORIF) ANKLE FRACTURE;  Surgeon: Cammy Copa, MD;  Location: [facility] OR;  Service: Orthopedics;  Laterality: Right;  . rotator cuff surgery      Home Medications:  Prescriptions Prior to Admission  Medication Sig Dispense Refill Last Dose  . acetaminophen (TYLENOL) 325 MG tablet Take 650 mg by mouth every 4 (four) hours as needed for moderate pain.   unknown  . amLODipine (NORVASC) 5 MG tablet Take 1 tablet (5 mg total) by mouth daily. 30 tablet 11 09/11/2016 at Unknown time  . apixaban (ELIQUIS) 2.5 MG TABS tablet Take 1 tablet (2.5 mg total) by mouth 2 (two) times daily. 60 tablet 0 09/11/2016 at 1800  . atorvastatin (LIPITOR) 10 MG tablet Take 1 tablet (10 mg total) by mouth daily at 6 PM. 30 tablet 0 09/11/2016 at Unknown time  . budesonide (PULMICORT) 0.5 MG/2ML nebulizer solution Take 4 mLs (1 mg total) by nebulization 2 (two) times daily. 2 mL 0 09/11/2016 at Unknown time  . buPROPion (WELLBUTRIN XL) 150 MG 24 hr tablet Take 150 mg by mouth daily. Reported on 08/02/2015   09/11/2016 at Unknown time  . cholecalciferol (VITAMIN D) 1000 units tablet Take 1,000 Units by mouth daily.   09/11/2016 at Unknown time  . collagenase (SANTYL) ointment Apply 1 application topically 2 (two) times daily.   09/11/2016 at Unknown time  . diphenhydrAMINE (BENADRYL) 25 MG  tablet Take 25 mg by mouth every 6 (six) hours as needed for itching.   unknown  . ertapenem (INVANZ) 1 g injection Inject 1 g into the muscle daily.   09/11/2016 at Unknown time  . ferrous sulfate 325 (65 FE) MG tablet Take 325 mg by mouth 3 (three) times daily with meals.   09/11/2016 at Unknown time  . fluticasone (FLOVENT HFA) 220 MCG/ACT inhaler Inhale 2 puffs into the lungs 2 (two) times daily as needed (shortness of breath/ wheezing).   unknown  . furosemide (LASIX) 20 MG tablet Take 20 mg by mouth daily.    09/11/2016 at Unknown time  . hydrALAZINE (APRESOLINE) 25 MG tablet Take 1 tablet (25 mg total) by mouth every 8 (eight) hours. 90 tablet 0 09/11/2016 at Unknown time  . HYDROcodone-acetaminophen (NORCO/VICODIN) 5-325 MG tablet Take 1-2 tablets by mouth every 6 (six) hours as needed for moderate pain. 60 tablet 0 unknown  . ipratropium-albuterol (DUONEB) 0.5-2.5 (3) MG/3ML SOLN Take 3 mLs by nebulization every 6 (six) hours as needed. Wheezing or shortness of breath. (Patient taking differently: Take 3 mLs by nebulization every 6 (six) hours as needed (shortness of breath/ wheezing). )   unknown  . isosorbide mononitrate (IMDUR) 30 MG 24 hr tablet Take 1 tablet (30 mg total) by mouth daily. 30 tablet 0 09/11/2016 at Unknown time  . metoprolol succinate (TOPROL-XL) 50 MG 24 hr tablet Take 1 tablet (50 mg total) by mouth daily. Take with or immediately following a meal. 30 tablet 0 09/11/2016 at 0800  . omeprazole (PRILOSEC) 20 MG capsule Take 20 mg by mouth daily.   09/11/2016 at Unknown time  . oxybutynin (DITROPAN) 5 MG tablet Take 5 mg by mouth 2 (two) times daily.   09/11/2016 at Unknown time  . polyethylene glycol (MIRALAX / GLYCOLAX) packet Take 17 g by mouth daily as needed for mild constipation. 14 each 0 unknown  . PROMETHEGAN 25 MG suppository Place 25 mg rectally daily as needed for nausea or vomiting.    unknown  . Skin Protectants, Misc. (EUCERIN) cream Apply 1 application topically daily.   09/11/2016 at Unknown time  . sodium hypochlorite (DAKIN'S 1/4 STRENGTH) 0.125 % SOLN Irrigate with 1 application as directed 2 (two) times daily.   09/11/2016 at Unknown time  . OXYGEN Inhale 2 L into the lungs continuous.   Unknown at Unknown   Allergies: No Known Allergies  Family History  Problem Relation Age of Onset  . Heart attack Mother   . Heart disease Mother   . Stomach cancer Father    Social History:  reports that she has quit smoking. She has never used smokeless tobacco. She reports that she does  not drink alcohol or use drugs.  ROS: A complete review of systems was performed.  All systems are negative except for pertinent findings as noted. ROS   Physical Exam:  Vital signs in last 24 hours: Temp:  [97.5 F (36.4 C)-99.1 F (37.3 C)] 97.5 F (36.4 C) (03/08 2100) Pulse Rate:  [68-81] 81 (03/08 2100) Resp:  [13-24] 18 (03/08 2100) BP: (100-132)/(41-98) 125/48 (03/08 2100) SpO2:  [97 %-100 %] 97 % (03/08 2100) Weight:  [104.3 kg (229 lb 15 oz)] 104.3 kg (229 lb 15 oz) (03/08 0430)   Intake/Output Summary (Last 24 hours) at 09/12/16 2235 Last data filed at 09/12/16 2100  Gross per 24 hour  Intake          1216.25 ml  Output  500 ml  Net           716.25 ml    General:  Alert and oriented, No acute distress HEENT: Normocephalic, atraumatic Neck: No JVD or lymphadenopathy Cardiovascular: Regular rate and rhythm Lungs: Regular rate and effort Abdomen: Soft, nontender, nondistended, no abdominal masses Back: No CVA tenderness Extremities: No edema Neurologic: Grossly intact GU: urine pink and cloudy   Bladder irrigation: I irrigated the Foley catheter and it cleared with 2 or 3 syringes. There was no ongoing bleeding. No clots. There was no red urine.  Laboratory Data:  Results for orders placed or performed during the hospital encounter of 09/11/16 (from the past 24 hour(s))  Protime-INR     Status: Abnormal   Collection Time: 09/11/16 10:34 PM  Result Value Ref Range   Prothrombin Time 26.5 (H) 11.4 - 15.2 seconds   INR 2.39   APTT     Status: Abnormal   Collection Time: 09/11/16 10:34 PM  Result Value Ref Range   aPTT 45 (H) 24 - 36 seconds  CBC     Status: Abnormal   Collection Time: 09/11/16 10:34 PM  Result Value Ref Range   WBC 5.4 4.0 - 10.5 K/uL   RBC 2.29 (L) 3.87 - 5.11 MIL/uL   Hemoglobin 7.1 (L) 12.0 - 15.0 g/dL   HCT 47.822.7 (L) 29.536.0 - 62.146.0 %   MCV 99.1 78.0 - 100.0 fL   MCH 31.0 26.0 - 34.0 pg   MCHC 31.3 30.0 - 36.0 g/dL   RDW  30.819.2 (H) 65.711.5 - 15.5 %   Platelets 148 (L) 150 - 400 K/uL  Differential     Status: None   Collection Time: 09/11/16 10:34 PM  Result Value Ref Range   Neutrophils Relative % 67 %   Neutro Abs 3.6 1.7 - 7.7 K/uL   Lymphocytes Relative 25 %   Lymphs Abs 1.4 0.7 - 4.0 K/uL   Monocytes Relative 6 %   Monocytes Absolute 0.3 0.1 - 1.0 K/uL   Eosinophils Relative 2 %   Eosinophils Absolute 0.1 0.0 - 0.7 K/uL   Basophils Relative 0 %   Basophils Absolute 0.0 0.0 - 0.1 K/uL  Comprehensive metabolic panel     Status: Abnormal   Collection Time: 09/11/16 10:34 PM  Result Value Ref Range   Sodium 133 (L) 135 - 145 mmol/L   Potassium 4.9 3.5 - 5.1 mmol/L   Chloride 108 101 - 111 mmol/L   CO2 19 (L) 22 - 32 mmol/L   Glucose, Bld 142 (H) 65 - 99 mg/dL   BUN 33 (H) 6 - 20 mg/dL   Creatinine, Ser 8.461.65 (H) 0.44 - 1.00 mg/dL   Calcium 8.1 (L) 8.9 - 10.3 mg/dL   Total Protein 5.7 (L) 6.5 - 8.1 g/dL   Albumin 1.8 (L) 3.5 - 5.0 g/dL   AST 27 15 - 41 U/L   ALT 8 (L) 14 - 54 U/L   Alkaline Phosphatase 73 38 - 126 U/L   Total Bilirubin 1.0 0.3 - 1.2 mg/dL   GFR calc non Af Amer 29 (L) >60 mL/min   GFR calc Af Amer 33 (L) >60 mL/min   Anion gap 6 5 - 15  CBG monitoring, ED     Status: None   Collection Time: 09/11/16 10:36 PM  Result Value Ref Range   Glucose-Capillary 98 65 - 99 mg/dL  I-stat troponin, ED     Status: None   Collection Time: 09/11/16 10:39 PM  Result Value Ref Range   Troponin i, poc 0.01 0.00 - 0.08 ng/mL   Comment 3          I-Stat Chem 8, ED     Status: Abnormal   Collection Time: 09/11/16 10:40 PM  Result Value Ref Range   Sodium 136 135 - 145 mmol/L   Potassium 5.2 (H) 3.5 - 5.1 mmol/L   Chloride 108 101 - 111 mmol/L   BUN 37 (H) 6 - 20 mg/dL   Creatinine, Ser 1.61 (H) 0.44 - 1.00 mg/dL   Glucose, Bld 096 (H) 65 - 99 mg/dL   Calcium, Ion 0.45 (L) 1.15 - 1.40 mmol/L   TCO2 21 0 - 100 mmol/L   Hemoglobin 7.1 (L) 12.0 - 15.0 g/dL   HCT 40.9 (L) 81.1 - 91.4 %   Urinalysis, Routine w reflex microscopic     Status: Abnormal   Collection Time: 09/11/16 11:05 PM  Result Value Ref Range   Color, Urine BROWN (A) YELLOW   APPearance TURBID (A) CLEAR   Specific Gravity, Urine 1.015 1.005 - 1.030   pH 5.0 5.0 - 8.0   Glucose, UA 50 (A) NEGATIVE mg/dL   Hgb urine dipstick MODERATE (A) NEGATIVE   Bilirubin Urine NEGATIVE NEGATIVE   Ketones, ur NEGATIVE NEGATIVE mg/dL   Protein, ur 782 (A) NEGATIVE mg/dL   Nitrite NEGATIVE NEGATIVE   Leukocytes, UA SMALL (A) NEGATIVE   RBC / HPF TOO NUMEROUS TO COUNT 0 - 5 RBC/hpf   WBC, UA TOO NUMEROUS TO COUNT 0 - 5 WBC/hpf   Bacteria, UA FEW (A) NONE SEEN   Squamous Epithelial / LPF 6-30 (A) NONE SEEN   WBC Clumps PRESENT   POC occult blood, ED Provider will collect     Status: None   Collection Time: 09/11/16 11:17 PM  Result Value Ref Range   Fecal Occult Bld NEGATIVE NEGATIVE  Type and screen [facility]     Status: None (Preliminary result)   Collection Time: 09/12/16 12:03 AM  Result Value Ref Range   ABO/RH(D) A POS    Antibody Screen POS    Sample Expiration 09/15/2016    DAT, IgG NEG    Antibody Identification NO CLINICALLY SIGNIFICANT ANTIBODY IDENTIFIED    Unit Number [id number]    Blood Component Type RED CELLS,LR    Unit division 00    Status of Unit ISSUED    Transfusion Status OK TO TRANSFUSE    Crossmatch Result COMPATIBLE    Donor AG Type      NEGATIVE FOR C ANTIGEN NEGATIVE FOR E ANTIGEN NEGATIVE FOR KELL ANTIGEN NEGATIVE FOR DUFFY A ANTIGEN NEGATIVE FOR DUFFY B ANTIGEN   Unit Number [id number]    Blood Component Type RED CELLS,LR    Unit division 00    Status of Unit REL FROM [facility]    Transfusion Status PENDING    Crossmatch Result PENDING   POC CBG, ED     Status: None   Collection Time: 09/12/16 12:08 AM  Result Value Ref Range   Glucose-Capillary 65 65 - 99 mg/dL   Comment 1 Notify RN    Comment 2 Document in Chart   POC CBG, ED     Status: None    Collection Time: 09/12/16  1:29 AM  Result Value Ref Range   Glucose-Capillary 98 65 - 99 mg/dL   Comment 1 Notify RN    Comment 2 Document in Chart   Prepare RBC     Status:  None   Collection Time: 09/12/16  2:46 AM  Result Value Ref Range   Order Confirmation ORDER PROCESSED BY BLOOD BANK   POC CBG, ED     Status: None   Collection Time: 09/12/16  3:08 AM  Result Value Ref Range   Glucose-Capillary 72 65 - 99 mg/dL   Comment 1 Notify RN    Comment 2 Document in Chart   CBC     Status: Abnormal   Collection Time: 09/12/16  5:50 AM  Result Value Ref Range   WBC 6.7 4.0 - 10.5 K/uL   RBC 2.41 (L) 3.87 - 5.11 MIL/uL   Hemoglobin 7.5 (L) 12.0 - 15.0 g/dL   HCT 40.9 (L) 81.1 - 91.4 %   MCV 99.6 78.0 - 100.0 fL   MCH 31.1 26.0 - 34.0 pg   MCHC 31.3 30.0 - 36.0 g/dL   RDW 78.2 (H) 95.6 - 21.3 %   Platelets 187 150 - 400 K/uL  Basic metabolic panel     Status: Abnormal   Collection Time: 09/12/16  5:50 AM  Result Value Ref Range   Sodium 135 135 - 145 mmol/L   Potassium 4.2 3.5 - 5.1 mmol/L   Chloride 109 101 - 111 mmol/L   CO2 18 (L) 22 - 32 mmol/L   Glucose, Bld 84 65 - 99 mg/dL   BUN 29 (H) 6 - 20 mg/dL   Creatinine, Ser 0.86 (H) 0.44 - 1.00 mg/dL   Calcium 8.3 (L) 8.9 - 10.3 mg/dL   GFR calc non Af Amer 29 (L) >60 mL/min   GFR calc Af Amer 33 (L) >60 mL/min   Anion gap 8 5 - 15  Glucose, capillary     Status: None   Collection Time: 09/12/16  6:26 AM  Result Value Ref Range   Glucose-Capillary 68 65 - 99 mg/dL   Comment 1 Notify RN    Comment 2 Document in Chart   Glucose, capillary     Status: None   Collection Time: 09/12/16  7:13 AM  Result Value Ref Range   Glucose-Capillary 70 65 - 99 mg/dL   Comment 1 Notify RN    Comment 2 Document in Chart   Glucose, capillary     Status: None   Collection Time: 09/12/16 10:27 AM  Result Value Ref Range   Glucose-Capillary 82 65 - 99 mg/dL  TSH     Status: None   Collection Time: 09/12/16  2:30 PM  Result Value Ref  Range   TSH 2.368 0.350 - 4.500 uIU/mL  T4, free     Status: Abnormal   Collection Time: 09/12/16  2:30 PM  Result Value Ref Range   Free T4 1.42 (H) 0.61 - 1.12 ng/dL  Cortisol     Status: None   Collection Time: 09/12/16  2:30 PM  Result Value Ref Range   Cortisol, Plasma 12.6 ug/dL  Glucose, capillary     Status: None   Collection Time: 09/12/16  4:21 PM  Result Value Ref Range   Glucose-Capillary 79 65 - 99 mg/dL  Glucose, capillary     Status: None   Collection Time: 09/12/16  9:31 PM  Result Value Ref Range   Glucose-Capillary 80 65 - 99 mg/dL   Comment 1 Notify RN    Comment 2 Document in Chart    No results found for this or any previous visit (from the past 240 hour(s)). 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  Hematuria-this is expected with a ureteral stent and a Foley. Likely clinically insignificant. The patient does not need a Foley catheter from a urologic point of view, but it makes sense to keep a catheter until she is able to transfer and sit on a bedside commode to void. Otherwise, she'll need to void in a diaper and be changed frequently.  She has an appt with me on 09/16/2016 at 10:30 AM to discuss ureteroscopy and stent change. Given her slow recovery, we may need to delay for another 6 weeks.   Lisa [name] 09/12/2016, 10:31 PM

## 2016-09-12 NOTE — Patient Outreach (Signed)
[facility] Care Management  09/12/2016  Lisa [name] 1941-04-10 [id number]   Noted that member was readmitted to hospital from rehab.  Per LCSW note, member was scheduled to be discharged tomorrow.  Hospital liaisons and LCSW notified.  If discharged home, will follow up after discharge.  If discharged back to facility, will follow up when notified of release.  [name], [place]RN, MSN [facility] Care Management  [facility] Care [facility] [phone]

## 2016-09-13 DIAGNOSIS — E871 Hypo-osmolality and hyponatremia: Secondary | ICD-10-CM | POA: Diagnosis present

## 2016-09-13 DIAGNOSIS — Z960 Presence of urogenital implants: Secondary | ICD-10-CM | POA: Diagnosis not present

## 2016-09-13 DIAGNOSIS — T83511A Infection and inflammatory reaction due to indwelling urethral catheter, initial encounter: Secondary | ICD-10-CM | POA: Diagnosis present

## 2016-09-13 DIAGNOSIS — N184 Chronic kidney disease, stage 4 (severe): Secondary | ICD-10-CM | POA: Diagnosis present

## 2016-09-13 DIAGNOSIS — R319 Hematuria, unspecified: Secondary | ICD-10-CM | POA: Diagnosis not present

## 2016-09-13 DIAGNOSIS — N183 Chronic kidney disease, stage 3 (moderate): Secondary | ICD-10-CM | POA: Diagnosis not present

## 2016-09-13 DIAGNOSIS — D638 Anemia in other chronic diseases classified elsewhere: Secondary | ICD-10-CM | POA: Diagnosis not present

## 2016-09-13 DIAGNOSIS — F05 Delirium due to known physiological condition: Secondary | ICD-10-CM | POA: Diagnosis present

## 2016-09-13 DIAGNOSIS — E43 Unspecified severe protein-calorie malnutrition: Secondary | ICD-10-CM | POA: Diagnosis present

## 2016-09-13 DIAGNOSIS — E7220 Disorder of urea cycle metabolism, unspecified: Secondary | ICD-10-CM | POA: Diagnosis not present

## 2016-09-13 DIAGNOSIS — D649 Anemia, unspecified: Secondary | ICD-10-CM | POA: Diagnosis not present

## 2016-09-13 DIAGNOSIS — D508 Other iron deficiency anemias: Secondary | ICD-10-CM | POA: Diagnosis not present

## 2016-09-13 DIAGNOSIS — A491 Streptococcal infection, unspecified site: Secondary | ICD-10-CM | POA: Diagnosis not present

## 2016-09-13 DIAGNOSIS — E11622 Type 2 diabetes mellitus with other skin ulcer: Secondary | ICD-10-CM | POA: Diagnosis present

## 2016-09-13 DIAGNOSIS — B952 Enterococcus as the cause of diseases classified elsewhere: Secondary | ICD-10-CM | POA: Diagnosis present

## 2016-09-13 DIAGNOSIS — E162 Hypoglycemia, unspecified: Secondary | ICD-10-CM | POA: Diagnosis present

## 2016-09-13 DIAGNOSIS — E785 Hyperlipidemia, unspecified: Secondary | ICD-10-CM | POA: Diagnosis not present

## 2016-09-13 DIAGNOSIS — L97229 Non-pressure chronic ulcer of left calf with unspecified severity: Secondary | ICD-10-CM | POA: Diagnosis present

## 2016-09-13 DIAGNOSIS — Z7189 Other specified counseling: Secondary | ICD-10-CM | POA: Diagnosis not present

## 2016-09-13 DIAGNOSIS — R627 Adult failure to thrive: Secondary | ICD-10-CM | POA: Diagnosis not present

## 2016-09-13 DIAGNOSIS — X58XXXD Exposure to other specified factors, subsequent encounter: Secondary | ICD-10-CM | POA: Diagnosis not present

## 2016-09-13 DIAGNOSIS — E1122 Type 2 diabetes mellitus with diabetic chronic kidney disease: Secondary | ICD-10-CM | POA: Diagnosis present

## 2016-09-13 DIAGNOSIS — N390 Urinary tract infection, site not specified: Secondary | ICD-10-CM

## 2016-09-13 DIAGNOSIS — Z87891 Personal history of nicotine dependence: Secondary | ICD-10-CM

## 2016-09-13 DIAGNOSIS — R8271 Bacteriuria: Secondary | ICD-10-CM

## 2016-09-13 DIAGNOSIS — G9341 Metabolic encephalopathy: Secondary | ICD-10-CM | POA: Diagnosis not present

## 2016-09-13 DIAGNOSIS — Z800 Family history of malignant neoplasm of digestive organs: Secondary | ICD-10-CM

## 2016-09-13 DIAGNOSIS — Z1621 Resistance to vancomycin: Secondary | ICD-10-CM | POA: Diagnosis not present

## 2016-09-13 DIAGNOSIS — I5032 Chronic diastolic (congestive) heart failure: Secondary | ICD-10-CM | POA: Diagnosis present

## 2016-09-13 DIAGNOSIS — Z951 Presence of aortocoronary bypass graft: Secondary | ICD-10-CM | POA: Diagnosis not present

## 2016-09-13 DIAGNOSIS — R7881 Bacteremia: Secondary | ICD-10-CM

## 2016-09-13 DIAGNOSIS — Z8249 Family history of ischemic heart disease and other diseases of the circulatory system: Secondary | ICD-10-CM

## 2016-09-13 DIAGNOSIS — Z87448 Personal history of other diseases of urinary system: Secondary | ICD-10-CM | POA: Diagnosis not present

## 2016-09-13 DIAGNOSIS — G9340 Encephalopathy, unspecified: Secondary | ICD-10-CM | POA: Diagnosis not present

## 2016-09-13 DIAGNOSIS — S7292XD Unspecified fracture of left femur, subsequent encounter for closed fracture with routine healing: Secondary | ICD-10-CM | POA: Diagnosis not present

## 2016-09-13 DIAGNOSIS — I10 Essential (primary) hypertension: Secondary | ICD-10-CM | POA: Diagnosis not present

## 2016-09-13 DIAGNOSIS — N179 Acute kidney failure, unspecified: Secondary | ICD-10-CM | POA: Diagnosis not present

## 2016-09-13 DIAGNOSIS — I130 Hypertensive heart and chronic kidney disease with heart failure and stage 1 through stage 4 chronic kidney disease, or unspecified chronic kidney disease: Secondary | ICD-10-CM | POA: Diagnosis present

## 2016-09-13 DIAGNOSIS — Y846 Urinary catheterization as the cause of abnormal reaction of the patient, or of later complication, without mention of misadventure at the time of the procedure: Secondary | ICD-10-CM | POA: Diagnosis present

## 2016-09-13 DIAGNOSIS — I482 Chronic atrial fibrillation: Secondary | ICD-10-CM | POA: Diagnosis not present

## 2016-09-13 DIAGNOSIS — Z515 Encounter for palliative care: Secondary | ICD-10-CM | POA: Diagnosis not present

## 2016-09-13 DIAGNOSIS — R4182 Altered mental status, unspecified: Secondary | ICD-10-CM | POA: Diagnosis not present

## 2016-09-13 DIAGNOSIS — I2510 Atherosclerotic heart disease of native coronary artery without angina pectoris: Secondary | ICD-10-CM | POA: Diagnosis present

## 2016-09-13 DIAGNOSIS — N3001 Acute cystitis with hematuria: Secondary | ICD-10-CM | POA: Diagnosis not present

## 2016-09-13 DIAGNOSIS — D696 Thrombocytopenia, unspecified: Secondary | ICD-10-CM | POA: Diagnosis not present

## 2016-09-13 DIAGNOSIS — E11649 Type 2 diabetes mellitus with hypoglycemia without coma: Secondary | ICD-10-CM | POA: Diagnosis present

## 2016-09-13 LAB — GLUCOSE, CAPILLARY
GLUCOSE-CAPILLARY: 82 mg/dL (ref 65–99)
GLUCOSE-CAPILLARY: 88 mg/dL (ref 65–99)
Glucose-Capillary: 77 mg/dL (ref 65–99)
Glucose-Capillary: 81 mg/dL (ref 65–99)
Glucose-Capillary: 85 mg/dL (ref 65–99)

## 2016-09-13 LAB — BLOOD CULTURE ID PANEL (REFLEXED)

## 2016-09-13 LAB — BPAM RBC
Blood Product Expiration Date: 201803212359
Blood Product Expiration Date: 201803282359
ISSUE DATE / TIME: 201803080948
UNIT TYPE AND RH: 6200
UNIT TYPE AND RH: 6200

## 2016-09-13 LAB — BASIC METABOLIC PANEL WITH GFR
Anion gap: 7 (ref 5–15)
BUN: 27 mg/dL — ABNORMAL HIGH (ref 6–20)
CO2: 21 mmol/L — ABNORMAL LOW (ref 22–32)
Calcium: 8.4 mg/dL — ABNORMAL LOW (ref 8.9–10.3)
Chloride: 108 mmol/L (ref 101–111)
Creatinine, Ser: 1.53 mg/dL — ABNORMAL HIGH (ref 0.44–1.00)
GFR calc Af Amer: 37 mL/min — ABNORMAL LOW
GFR calc non Af Amer: 32 mL/min — ABNORMAL LOW
Glucose, Bld: 75 mg/dL (ref 65–99)
Potassium: 4.1 mmol/L (ref 3.5–5.1)
Sodium: 136 mmol/L (ref 135–145)

## 2016-09-13 LAB — CBC
HCT: 27.1 % — ABNORMAL LOW (ref 36.0–46.0)
Hemoglobin: 8.5 g/dL — ABNORMAL LOW (ref 12.0–15.0)
MCH: 28.7 pg (ref 26.0–34.0)
MCHC: 31.4 g/dL (ref 30.0–36.0)
MCV: 91.6 fL (ref 78.0–100.0)
PLATELETS: 186 10*3/uL (ref 150–400)
RBC: 2.96 MIL/uL — AB (ref 3.87–5.11)
RDW: 25.9 % — ABNORMAL HIGH (ref 11.5–15.5)
WBC: 8.7 10*3/uL (ref 4.0–10.5)

## 2016-09-13 LAB — SAVE SMEAR: SMEAR REVIEW: NONE SEEN

## 2016-09-13 LAB — TYPE AND SCREEN
ABO/RH(D): A POS
ANTIBODY SCREEN: POSITIVE
DAT, IgG: NEGATIVE
UNIT DIVISION: 0
Unit division: 0

## 2016-09-13 MED ORDER — LINEZOLID 600 MG PO TABS
600.0000 mg | ORAL_TABLET | Freq: Two times a day (BID) | ORAL | Status: AC
  Administered 2016-09-13 – 2016-09-21 (×15): 600 mg via ORAL
  Filled 2016-09-13 (×18): qty 1

## 2016-09-13 MED ORDER — LINEZOLID 600 MG/300ML IV SOLN
600.0000 mg | INTRAVENOUS | Status: AC
  Administered 2016-09-13: 600 mg via INTRAVENOUS
  Filled 2016-09-13: qty 300

## 2016-09-13 MED ORDER — APIXABAN 2.5 MG PO TABS
2.5000 mg | ORAL_TABLET | Freq: Two times a day (BID) | ORAL | Status: DC
  Administered 2016-09-13 – 2016-09-16 (×8): 2.5 mg via ORAL
  Filled 2016-09-13 (×8): qty 1

## 2016-09-13 MED ORDER — ORAL CARE MOUTH RINSE
15.0000 mL | Freq: Two times a day (BID) | OROMUCOSAL | Status: DC
  Administered 2016-09-13 (×2): 15 mL via OROMUCOSAL

## 2016-09-13 NOTE — Care Management Note (Signed)
Case Management Note  Patient Details  Name: Lisa [name] MRN: [id number] Date of Birth: 21-Nov-1940  Subjective/Objective:     CM  Following for progression and d/c planning.               Action/Plan: 09/13/2017 [facility] Will follow for any changes in d/c plans.   Expected Discharge Date:                  Expected Discharge Plan:  Skilled Nursing Facility  In-House Referral:  Clinical Social Work  Discharge planning Services  NA  Post Acute Care Choice:  NA Choice offered to:  NA  DME Arranged:    DME Agency:     HH Arranged:    HH Agency:     Status of Service:  In process, will continue to follow  If discussed at Long Length of Stay Meetings, dates discussed:    Additional Comments:  Starlyn SkeansRoyal, Cayleen Benjamin U, RN 09/13/2016, 11:54 AM

## 2016-09-13 NOTE — Progress Notes (Signed)
PHARMACY - PHYSICIAN COMMUNICATION CRITICAL VALUE ALERT - BLOOD CULTURE IDENTIFICATION (BCID)  Pt with GPC prs/clusters in anaerobic bottle from 3/8. 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   Name of physician (or Provider) Contacted: Kirtland BouchardK. Schorr (NP)  Changes to prescribed antibiotics required:  Linezolid 600mg  IV now then consult ID in a.m.  Lisa [name], PharmD, BCPS Clinical pharmacist, pager [phone] 09/13/2016  2:29 AM

## 2016-09-13 NOTE — Consult Note (Signed)
[facility]       Reason for Consult: Enterococcal bacteremia    Referring Physician: CHAMP autoconsult  Principal Problem:   Hypoglycemia Active Problems:   Chronic atrial fibrillation (HCC)   Chronic anticoagulation   Anemia, normocytic normochromic   Acute on chronic renal insufficiency   Femur fracture, left (HCC)   Acute cystitis with hematuria   Acute encephalopathy   . amLODipine  5 mg Oral Daily  . apixaban  2.5 mg Oral BID  . atorvastatin  10 mg Oral q1800  . budesonide  1 mg Nebulization BID  . cholecalciferol  1,000 Units Oral Daily  . collagenase   Topical Daily  . ferrous sulfate  325 mg Oral TID WC  . hydrALAZINE  25 mg Oral Q8H  . isosorbide mononitrate  30 mg Oral Daily  . linezolid  600 mg Oral Q12H  . mouth rinse  15 mL Mouth Rinse BID  . metoprolol succinate  50 mg Oral Daily  . oxybutynin  5 mg Oral BID  . pantoprazole  40 mg Oral Daily    Recommendations: linezolid for 10 days total as long as repeat blood cultures remain negative  urology follow up Hold SSRI on linezolid treatment Repeat blood cultures tomorrow  I will sign off, thanks  Assessment: She has 1/2 blood cultures with Enterococcus (not reported on BCID despite being positive) and urine culture with E faecium confirming a urinary source.  Recent urine culture with VRE ampicillin resistant.   Antibiotics: linezolid  HPI: Lisa [name] is a 76 y.o. female with recent hip fracture in January 2018 with ORIF with left UPJ obstruction with placement of double-J stent who comes in now with AMS from NH.  Afebrile and normal WBC but cultures as above in blood and urine. She has been feeling poorly and still does today.  + hematuria.  Discussed findings with daughter at the bedside.  Received linezolid x1 last night.     Review of Systems:  Constitutional: negative for fevers and chills Gastrointestinal: negative for diarrhea Integument/breast: negative  for rash Musculoskeletal: negative for myalgias and arthralgias All other systems reviewed and are negative    Past Medical History:  Diagnosis Date  . Chronic diastolic CHF (congestive heart failure) (HCC)   . Coronary artery disease    s/p CABG  . Diabetes mellitus without complication (HCC)   . Hyperlipidemia   . Hypertension   . Morbid obesity (HCC)   . OSA (obstructive sleep apnea)    mild OSA with AHI 7.15 now on CPAP at 16cm H2O  . RBBB     Social History  Substance Use Topics  . Smoking status: Former Games developer  . Smokeless tobacco: Never Used  . Alcohol use No    Family History  Problem Relation Age of Onset  . Heart attack Mother   . Heart disease Mother   . Stomach cancer Father     No Known Allergies  Physical Exam: Constitutional: in no apparent distress  Vitals:   09/13/16 0912 09/13/16 1315  BP: (!) 141/60 138/64  Pulse: 74 76  Resp: 20 20  Temp: 97.9 F (36.6 C) 97.8 F (36.6 C)   EYES: anicteric ENMT: thrush Cardiovascular: Cor RRR Respiratory: CTA B; normal respiratory effort GI: Bowel sounds are normal, soft, nt Musculoskeletal: no pedal edema noted Skin: negatives: no rash  Lab Results  Component Value Date   WBC 8.7 09/13/2016   HGB 8.5 (L) 09/13/2016  HCT 27.1 (L) 09/13/2016   MCV 91.6 09/13/2016   PLT 186 09/13/2016    Lab Results  Component Value Date   CREATININE 1.53 (H) 09/13/2016   BUN 27 (H) 09/13/2016   NA 136 09/13/2016   K 4.1 09/13/2016   CL 108 09/13/2016   CO2 21 (L) 09/13/2016    Lab Results  Component Value Date   ALT 8 (L) 09/11/2016   AST 27 09/11/2016   ALKPHOS 73 09/11/2016     Microbiology: Recent Results (from the past 240 hour(s))  Urine culture     Status: Abnormal (Preliminary result)   Collection Time: 09/11/16 11:05 PM  Result Value Ref Range Status   Specimen Description URINE, RANDOM  Final   Special Requests NONE  Final   Culture (A)  Final    >=100,000 COLONIES/mL ENTEROCOCCUS  FAECIUM SUSCEPTIBILITIES TO FOLLOW    Report Status PENDING  Incomplete  Blood culture (routine x 2)     Status: None (Preliminary result)   Collection Time: 09/12/16  1:05 AM  Result Value Ref Range Status   Specimen Description BLOOD LEFT ARM  Final   Special Requests BOTTLES DRAWN AEROBIC AND ANAEROBIC 5ML  Final   Culture  Setup Time   Final    GRAM POSITIVE COCCI IN CHAINS IN PAIRS IN BOTH AEROBIC AND ANAEROBIC BOTTLES Organism ID to follow CRITICAL RESULT CALLED TO, READ BACK BY AND VERIFIED WITH: CARON AMEND, PHARMD @0144  09/13/16 MKELLY,MLT    Culture TOO YOUNG TO READ  Final   Report Status PENDING  Incomplete  Blood Culture ID Panel (Reflexed)     Status: None   Collection Time: 09/12/16  1:05 AM  Result Value Ref Range Status   Enterococcus species NOT DETECTED NOT DETECTED Final   Vancomycin resistance NOT DETECTED NOT DETECTED Final   Listeria monocytogenes NOT DETECTED NOT DETECTED Final   Staphylococcus species NOT DETECTED NOT DETECTED Final   Staphylococcus aureus NOT DETECTED NOT DETECTED Final   Streptococcus species NOT DETECTED NOT DETECTED Final   Streptococcus agalactiae NOT DETECTED NOT DETECTED Final   Streptococcus pneumoniae NOT DETECTED NOT DETECTED Final   Streptococcus pyogenes NOT DETECTED NOT DETECTED Final   Acinetobacter baumannii NOT DETECTED NOT DETECTED Final   Enterobacteriaceae species NOT DETECTED NOT DETECTED Final   Enterobacter cloacae complex NOT DETECTED NOT DETECTED Final   Escherichia coli NOT DETECTED NOT DETECTED Final   Klebsiella oxytoca NOT DETECTED NOT DETECTED Final   Klebsiella pneumoniae NOT DETECTED NOT DETECTED Final   Proteus species NOT DETECTED NOT DETECTED Final   Serratia marcescens NOT DETECTED NOT DETECTED Final   Haemophilus influenzae NOT DETECTED NOT DETECTED Final   Neisseria meningitidis NOT DETECTED NOT DETECTED Final   Pseudomonas aeruginosa NOT DETECTED NOT DETECTED Final   Candida albicans NOT DETECTED  NOT DETECTED Final   Candida glabrata NOT DETECTED NOT DETECTED Final   Candida krusei NOT DETECTED NOT DETECTED Final   Candida parapsilosis NOT DETECTED NOT DETECTED Final   Candida tropicalis NOT DETECTED NOT DETECTED Final  Blood culture (routine x 2)     Status: None (Preliminary result)   Collection Time: 09/12/16  5:55 AM  Result Value Ref Range Status   Specimen Description BLOOD RIGHT ARM  Final   Special Requests IN PEDIATRIC BOTTLE  2CC  Final   Culture NO GROWTH 1 DAY  Final   Report Status PENDING  Incomplete    Jalal Rauch, Molly MaduroOBERT, MD [facility]  [email or web address] [phone] pager  [phone] 09/13/2016, 3:21 PM

## 2016-09-13 NOTE — Care Management Note (Signed)
Case Management Note  Patient Details  Name: Lisa [name] MRN: [id number] Date of Birth: 09-30-1940  Subjective/Objective:   CM following for progression and d/c planning.                Action/Plan: 09/13/2016 Met with pt daughter, who states that the plan was for pt to d/c to home from SNF today. She has most recently been a pt a [facility], with plan to d/c  Home with daughter today 09/13/2016 . This CM contacted [facility] re [facility] services, they will need orders.  Home Health needs are [facility] for dressing changes, HHPT and HH aide.  [facility] has orders for home oxygen and have delivered tank to [facility], pt daughter will pick this up and keep in her car as pt will need to transport home by ambulance. [facility] provide daughter with number to call for delivery of hospital bed and oxygen concentrator.      Expected Discharge Date:                  Expected Discharge Plan:  [place]  In-House Referral:  Clinical Social Work  Discharge planning Services  CM Consult  Post Acute Care Choice:  Durable Medical Equipment, Home Health Choice offered to:  NA, Adult Children  DME Arranged:  Hospital bed, Oxygen DME Agency:  [place]:    [place] Agency:   ([place])  Status of Service:  In process, will continue to follow  If discussed at Long Length of Stay Meetings, dates discussed:    Additional Comments:  Adron Bene, RN 09/13/2016, 3:02 PM

## 2016-09-13 NOTE — Progress Notes (Signed)
PROGRESS NOTE                                                                                                                                                                                                             Patient Demographics:    Lisa [name], is a 76 y.o. female, DOB - 1940-09-17, YQI:[id number]  Admit date - 09/11/2016   Admitting Physician Hillary Bow, DO  Outpatient Primary MD for the patient is Laurena Slimmer, MD  LOS - 0  Outpatient Specialists:  Dr Lawanna Kobus  Chief Complaint  Patient presents with  . Hypoglycemia       Brief Narrative   76 year old female with history of diabetes mellitus, chronic diastolic CHF, CAD status post CABG, paroxysmal A. fib on eliquis, (s/p cardioversion in 07/2015) OSA on CPAP, hypertension, hyperlipidemia, left femur fracture status post ORIF in 08/05/2016, hydronephrosis requiring cystoscopy with retrograde pyelogram and ureteral stent placement during the same hospitalization. She was readmitted on 2/17 for acute on chronic anemia and received 1 unit PRBC, negative Hemoccult but with gross hematuria for several weeks. Patient was at skilled nursing facility where she was found to be confused with strokelike symptoms. CBG was in the 40s and was given D50. Her neurological function had recovered while in the ED. In the ED her hemoglobin was 7.1 with UA again showing frank hematuria. UA was concerning for UTI. She was given a dose of Rocephin and Zyvox (given recent history of VRE). Admitted to hospitalist service.    Subjective:   Still has  some confusion. CBG stable   Assessment  & Plan :    Principal Problem:  Acute encephalopathy Likely combination of hypoglycemia and UTI with VRE. Continue D5. Mental status since we slowly improving. Urine culture from admission again growing VRE. ID consult involved. Started on linezolid. Follow-up with  sensitivity.    Anemia, acute on chronic Possibly anemia of chronic disease which has been worsening for past 3 months. Hemoccult negative on multiple occasions. Peripheral smear ordered. Also has gross hematuria for past several weeks. Evaluated by urology and suggested that some degree of hematuria is expected with ureteral stenting and chronic Foley. Consulted hematology. Dr. Mosetta Putt arrange outpatient follow-up within 1-2 weeks. Hemoglobin improved with 1 unit PRBC. Will resume her anticoagulation.    Active Problems:   Chronic atrial fibrillation (  HCC)  antioagulation was held on admission for anemia and ongoing hematuria. Will resume.   Chronic kidney disease stage III Renal function actually appears better than previously. Continue to monitor    Femur fracture, left (HCC) Status post ORIF,  5 weeks back. PT evaluation.  ? hypothyroidism High TSH with low T4 on prior labs. Repeat labs normal.  Essential hypertension Continue amlodipine.  Coronary artery disease Continue metoprolol, Imdur, hydralazine, statin.  Code Status : full code  Family Communication  : None at bedside . We'll update daughter  Disposition Plan  : Return to skilled nursing facility possibly early next week.  Barriers For Discharge : active symptoms  Consults  :   Urology neurology ID  Procedures  :  US renal  DVT Prophylaxis  :  - SCDs  Lab Results  Component Value Date   PLT 186 09/13/2016    Antibiotics  :    Anti-infectives    Start     Dose/Rate Route Frequency Ordered Stop   09/13/16 1215  linezolid (ZYVOX) tablet 600 mg     600 mg Oral Every 12 hours 09/13/16 1205     09/13/16 0230  linezolid (ZYVOX) IVPB 600 mg     600 mg 300 mL/hr over 60 Minutes Intravenous STAT 09/13/16 0224 09/13/16 0346   09/12/16 0015  linezolid (ZYVOX) IVPB 600 mg     600 mg 300 mL/hr over 60 Minutes Intravenous  Once 09/11/16 2359 09/12/16 0253   09/12/16 0000  cefTRIAXone (ROCEPHIN) 2 g in  dextrose 5 % 50 mL IVPB     2 g 100 mL/hr over 30 Minutes Intravenous  Once 09/11/16 2358 09/12/16 0253        Objective:   Vitals:   09/13/16 0133 09/13/16 0200 09/13/16 0500 09/13/16 0912  BP: (!) 129/115 (!) 122/58 135/67 (!) 141/60  Pulse: 76  77 74  Resp: 18  18 20   Temp: 97.8 F (36.6 C)  97.8 F (36.6 C) 97.9 F (36.6 C)  TempSrc: Oral  Axillary Axillary  SpO2: 100%  95% 96%  Weight:      Height:        Wt Readings from Last 3 Encounters:  09/12/16 104.3 kg (229 lb 15 oz)  08/25/16 105 kg (231 lb 7.7 oz)  08/10/16 105.2 kg (232 lb)     Intake/Output Summary (Last 24 hours) at 09/13/16 1259 Last data filed at 09/13/16 0606  Gross per 24 hour  Intake              200 ml  Output              900 ml  Net             -700 ml     Physical Exam  Gen: not in distress HEENT: Pallor present, moist mucosa, supple neck Chest: clear b/l, no added sounds CVS: S1 and S2 irregular, no murmurs or gallop GI: soft, NT, ND,  Musculoskeletal: warm, no edema CNS: AAOX1-2    Data Review:    CBC  Recent Labs Lab 09/11/16 2234 09/11/16 2240 09/12/16 0550 09/13/16 0426  WBC 5.4  --  6.7 8.7  HGB 7.1* 7.1* 7.5* 8.5*  HCT 22.7* 21.0* 24.0* 27.1*  PLT 148*  --  187 186  MCV 99.1  --  99.6 91.6  MCH 31.0  --  31.1 28.7  MCHC 31.3  --  31.3 31.4  RDW 19.2*  --  19.3* 25.9*  LYMPHSABS 1.4  --   --   --  MONOABS 0.3  --   --   --   EOSABS 0.1  --   --   --   BASOSABS 0.0  --   --   --     Chemistries   Recent Labs Lab 09/11/16 2234 09/11/16 2240 09/12/16 0550 09/13/16 0426  NA 133* 136 135 136  K 4.9 5.2* 4.2 4.1  CL 108 108 109 108  CO2 19*  --  18* 21*  GLUCOSE 142* 137* 84 75  BUN 33* 37* 29* 27*  CREATININE 1.65* 1.70* 1.65* 1.53*  CALCIUM 8.1*  --  8.3* 8.4*  AST 27  --   --   --   ALT 8*  --   --   --   ALKPHOS 73  --   --   --   BILITOT 1.0  --   --   --     ------------------------------------------------------------------------------------------------------------------ No results for input(s): CHOL, HDL, LDLCALC, TRIG, CHOLHDL, LDLDIRECT in the last 72 hours.  Lab Results  Component Value Date   HGBA1C 5.0 05/03/2016   ------------------------------------------------------------------------------------------------------------------  Recent Labs  09/12/16 1430  TSH 2.368   ------------------------------------------------------------------------------------------------------------------ No results for input(s): VITAMINB12, FOLATE, FERRITIN, TIBC, IRON, RETICCTPCT in the last 72 hours.  Coagulation profile  Recent Labs Lab 09/11/16 2234  INR 2.39    No results for input(s): DDIMER in the last 72 hours.  Cardiac Enzymes No results for input(s): CKMB, TROPONINI, MYOGLOBIN in the last 168 hours.  Invalid input(s): CK ------------------------------------------------------------------------------------------------------------------    Component Value Date/Time   BNP 500.3 (H) 08/02/2016 2352    Inpatient Medications  Scheduled Meds: . amLODipine  5 mg Oral Daily  . atorvastatin  10 mg Oral q1800  . budesonide  1 mg Nebulization BID  . buPROPion  150 mg Oral Daily  . cholecalciferol  1,000 Units Oral Daily  . collagenase   Topical Daily  . ferrous sulfate  325 mg Oral TID WC  . hydrALAZINE  25 mg Oral Q8H  . isosorbide mononitrate  30 mg Oral Daily  . linezolid  600 mg Oral Q12H  . mouth rinse  15 mL Mouth Rinse BID  . metoprolol succinate  50 mg Oral Daily  . oxybutynin  5 mg Oral BID  . pantoprazole  40 mg Oral Daily   Continuous Infusions: . dextrose 5 % and 0.45% NaCl 75 mL/hr at 09/13/16 0020   PRN Meds:.acetaminophen, diphenhydrAMINE, HYDROcodone-acetaminophen, ipratropium-albuterol, polyethylene glycol  Micro Results Recent Results (from the past 240 hour(s))  Urine culture     Status: Abnormal  (Preliminary result)   Collection Time: 09/11/16 11:05 PM  Result Value Ref Range Status   Specimen Description URINE, RANDOM  Final   Special Requests NONE  Final   Culture (A)  Final    >=100,000 COLONIES/mL ENTEROCOCCUS FAECIUM SUSCEPTIBILITIES TO FOLLOW    Report Status PENDING  Incomplete  Blood culture (routine x 2)     Status: None (Preliminary result)   Collection Time: 09/12/16  1:05 AM  Result Value Ref Range Status   Specimen Description BLOOD LEFT ARM  Final   Special Requests BOTTLES DRAWN AEROBIC AND ANAEROBIC 5ML  Final   Culture  Setup Time   Final    GRAM POSITIVE COCCI IN CHAINS IN PAIRS IN BOTH AEROBIC AND ANAEROBIC BOTTLES Organism ID to follow CRITICAL RESULT CALLED TO, READ BACK BY AND VERIFIED WITH: CARON AMEND, PHARMD @0144  09/13/16 MKELLY,MLT    Culture TOO YOUNG TO READ  Final  Report Status PENDING  Incomplete  Blood Culture ID Panel (Reflexed)     Status: None   Collection Time: 09/12/16  1:05 AM  Result Value Ref Range Status   Enterococcus species NOT DETECTED NOT DETECTED Final   Vancomycin resistance NOT DETECTED NOT DETECTED Final   Listeria monocytogenes NOT DETECTED NOT DETECTED Final   Staphylococcus species NOT DETECTED NOT DETECTED Final   Staphylococcus aureus NOT DETECTED NOT DETECTED Final   Streptococcus species NOT DETECTED NOT DETECTED Final   Streptococcus agalactiae NOT DETECTED NOT DETECTED Final   Streptococcus pneumoniae NOT DETECTED NOT DETECTED Final   Streptococcus pyogenes NOT DETECTED NOT DETECTED Final   Acinetobacter baumannii NOT DETECTED NOT DETECTED Final   Enterobacteriaceae species NOT DETECTED NOT DETECTED Final   Enterobacter cloacae complex NOT DETECTED NOT DETECTED Final   Escherichia coli NOT DETECTED NOT DETECTED Final   Klebsiella oxytoca NOT DETECTED NOT DETECTED Final   Klebsiella pneumoniae NOT DETECTED NOT DETECTED Final   Proteus species NOT DETECTED NOT DETECTED Final   Serratia marcescens NOT  DETECTED NOT DETECTED Final   Haemophilus influenzae NOT DETECTED NOT DETECTED Final   Neisseria meningitidis NOT DETECTED NOT DETECTED Final   Pseudomonas aeruginosa NOT DETECTED NOT DETECTED Final   Candida albicans NOT DETECTED NOT DETECTED Final   Candida glabrata NOT DETECTED NOT DETECTED Final   Candida krusei NOT DETECTED NOT DETECTED Final   Candida parapsilosis NOT DETECTED NOT DETECTED Final   Candida tropicalis NOT DETECTED NOT DETECTED Final  Blood culture (routine x 2)     Status: None (Preliminary result)   Collection Time: 09/12/16  5:55 AM  Result Value Ref Range Status   Specimen Description BLOOD RIGHT ARM  Final   Special Requests IN PEDIATRIC BOTTLE  2CC  Final   Culture NO GROWTH 1 DAY  Final   Report Status PENDING  Incomplete    Radiology Reports US Renal  Result Date: 09/12/2016 CLINICAL DATA:  Hematuria EXAM: RENAL / URINARY TRACT ULTRASOUND COMPLETE COMPARISON:  08/04/2016 CT FINDINGS: Right Kidney: Length: 11.2 cm. There is a simple appearing cyst in the lateral aspect of the mid to upper pole measuring 3.7 x 4.6 x 3.7 cm. An additional 2.7 x 2.1 x 2.2 cm upper pole cortical based cyst is also noted. Mild increase in echogenicity of the renal parenchyma. No obstructive uropathy or nephrolithiasis. Left Kidney: Length: 10.2 cm. Echogenicity is increased. No nephrolithiasis. There is an caliectasis of the left renal collecting system with what appears to be extent in the region of the renal pelvis. The degree of dilatation has decreased since prior CT. Bladder: Decompressed by Foley catheter. IMPRESSION: 1. Simple appearing right renal cyst. 2. Left-sided caliectasis with interval decompression of dilated left renal collecting system presumably by a stent which is partially visualized on these images. 3. Foley decompressed bladder. 4. Mild echogenicity of both kidneys may reflect medical renal disease. Electronically Signed   By: Tollie Eth M.D.   On: 09/12/2016 03:03    Dg Chest Portable 1 View  Result Date: 09/11/2016 CLINICAL DATA:  Mental status change EXAM: PORTABLE CHEST 1 VIEW COMPARISON:  08/02/2016 FINDINGS: Shallow inspiration. Stable curvilinear scarring. No confluent consolidation. No large effusion. Unchanged cardiomegaly. Normal pulmonary vasculature. IMPRESSION: Stable curvilinear scarring. Unchanged cardiomegaly. No confluent consolidation or large effusion. Electronically Signed   By: Ellery Plunk M.D.   On: 09/11/2016 22:59   Xr Femur Min 2 Views Left  Result Date: 08/28/2016 X-ray femur show satisfactory alignment. Hardware intact  after ORIF.  Xr Tibia/fibula Left  Result Date: 08/28/2016 X-ray tib-fib shows good bony anatomy. No acute change. No signs of fracture.  Ct Head Code Stroke W/o Cm  Result Date: 09/11/2016 CLINICAL DATA:  Code stroke. 76 y/o F; left-sided droop and weakness. EXAM: CT HEAD WITHOUT CONTRAST TECHNIQUE: Contiguous axial images were obtained from the base of the skull through the vertex without intravenous contrast. COMPARISON:  CT head 10/15/2012. FINDINGS: Brain: No evidence of acute infarction, hemorrhage, hydrocephalus, extra-axial collection or mass lesion/mass effect. Stable foci of hypoattenuation in subcortical and periventricular white matter compatible with microvascular ischemic changes. Stable lucencies in right caudate body, left caudate head, left lentiform nucleus, and left thalamus compatible with chronic lacunar infarcts. Vascular: No hyperdense vessel identified. Extensive calcific atherosclerosis of cavernous and paraclinoid internal carotid arteries. Skull: Normal. Negative for fracture or focal lesion. Sinuses/Orbits: No acute finding. Other: None. ASPECTS [facility] Stroke Program Early CT Score) - Ganglionic level infarction (caudate, lentiform nuclei, internal capsule, insula, M1-M3 cortex): 7 - Supraganglionic infarction (M4-M6 cortex): 3 Total score (0-10 with 10 being normal): 10 IMPRESSION: 1.  No acute intracranial abnormality identified. 2. ASPECTS is 10 3. Stable chronic microvascular ischemic changes, parenchymal volume loss, and basal ganglia chronic lacunar infarcts. These results were called by telephone at the time of interpretation on 09/11/2016 at 10:35 pm to Dr. Charm Barges, who verbally acknowledged these results. Electronically Signed   By: Mitzi Hansen M.D.   On: 09/11/2016 22:35    Time Spent in minutes  25   Eddie North M.D on 09/13/2016 at 12:59 PM  Between 7am to 7pm - Pager - [phone]  After 7pm go to [email or web address] - password [facility]  [facility] -  Office  [phone]

## 2016-09-13 NOTE — Care Management Obs Status (Signed)
MEDICARE OBSERVATION STATUS NOTIFICATION   Patient Details  Name: Lisa [name] MRN: [id number] Date of Birth: [birth year]   Medicare Observation Status Notification Given:  Yes  Notification given and explained to pt daughter who verbalizes understanding.   Chaka Jefferys, Annamarie Majorheryl U, RN 09/13/2016, 3:00 PM

## 2016-09-13 NOTE — Evaluation (Signed)
Physical Therapy Evaluation Patient Details Name: Lisa [name] MRN: [id number] DOB: [birth year] Today's Date: 09/13/2016   History of Present Illness  Patient is a 76 y/o female admitted from SNF due to AMS and r/o stroke, found to be anemic with Hgb 7.1 (ongoing hematuria after stent placed for hydronephrosis during admission 1/26-2/3 with L femur fx with ex-fix) and pt found to be hypoglycemic.   Clinical Impression  Patient presents with significant dependencies for mobility due to pain, weakness, decreased ROM and poor activity tolerance, poor balance.  She may benefit from skilled PT in the acute setting prior to return to SNF to assist with decreased burden of care.     Follow Up Recommendations SNF    Equipment Recommendations  None recommended by PT    Recommendations for Other Services       Precautions / Restrictions Precautions Precautions: Fall Restrictions Other Position/Activity Restrictions: admission after femur fx pt in KI and 50% PWB      Mobility  Bed Mobility Overal bed mobility: Needs Assistance Bed Mobility: Supine to Sit;Sit to Supine     Supine to sit: Max assist;HOB elevated Sit to supine: Max assist   General bed mobility comments: pt assisted with increased time to move legs off bed; max A to lift trunk, pt falling back and c/o neck and back pain so returned to supine, assist for legs back into bed and righting trunk, +2 A to scoot to [facility]  Transfers                    Ambulation/Gait                Stairs            Wheelchair Mobility    Modified Rankin (Stroke Patients Only)       Balance Overall balance assessment: Needs assistance   Sitting balance-Leahy Scale: Zero Sitting balance - Comments: leaning back and c/o needs to return to supine, max support for EOB about 10 seconds                                     Pertinent Vitals/Pain Pain Assessment: Faces Faces Pain Scale: Hurts whole  lot Pain Location: L LE  Pain Intervention(s): Monitored during session;Repositioned;Limited activity within patient's tolerance;Patient requesting pain meds-RN notified    Home Living Family/patient expects to be discharged to:: Skilled nursing facility                      Prior Function Level of Independence: Needs assistance   Gait / Transfers Assistance Needed: Patient reports gets up with staff assist every day, but she is poor historian     Comments: per old chart pt was transferring prior to femur fx with assist of two family members     Hand Dominance   Dominant Hand: Right    Extremity/Trunk Assessment   Upper Extremity Assessment Upper Extremity Assessment: RUE deficits/detail RUE Deficits / Details: unable to elevated shoulder even with assist due to reports pain after "rotary cup" repair     Lower Extremity Assessment Lower Extremity Assessment: RLE deficits/detail;LLE deficits/detail RLE Deficits / Details: ankle AROM limited due to weakness and pt resistent to PROM; knee flexion about 40 degrees (self limited), strength grossly 3-/5 LLE Deficits / Details: ankle AROM limited with pain at heel and wound dressing on heel, knee flexion about 15  degrees and c/o pain with scar extending below knee from recent ORIF; strength quads 2/5, hip flexion 1+/5    Cervical / Trunk Assessment Cervical / Trunk Assessment: Other exceptions Cervical / Trunk Exceptions: pain in neck and back with attempts at mobility, very weak trunk and limited lumbo pelvic mobility with posterior bias in sitting  Communication   Communication: No difficulties  Cognition Arousal/Alertness: Awake/alert Behavior During Therapy: Anxious Overall Cognitive Status: No family/caregiver present to determine baseline cognitive functioning                 General Comments: patient able to state in hospital, but states here to get stronger, then because of ankle fx, pt tearful and at times  ignores me due to asking questions she has already answered    General Comments General comments (skin integrity, edema, etc.): L lower leg with gauze around shin and dressing on heel with pressure pad under heel, noted breakdown on hammer toes on L foot as well    Exercises General Exercises - Lower Extremity Ankle Circles/Pumps: AROM;Both;10 reps;Supine Heel Slides: AAROM;Both;5 reps;Supine   Assessment/Plan    PT Assessment Patient needs continued PT services  PT Problem List Decreased range of motion;Decreased strength;Decreased mobility;Decreased activity tolerance;Decreased balance;Pain;Decreased skin integrity       PT Treatment Interventions Therapeutic activities;Patient/family education;Therapeutic exercise;Balance training;Functional mobility training    PT Goals (Current goals can be found in the Care Plan section)  Acute Rehab PT Goals PT Goal Formulation: Patient unable to participate in goal setting Time For Goal Achievement: 09/27/16 Potential to Achieve Goals: Fair    Frequency Min 2X/week   Barriers to discharge        Co-evaluation               End of Session   Activity Tolerance: Patient limited by pain;Patient limited by fatigue Patient left: in bed;with call bell/phone within reach;with bed alarm set   PT Visit Diagnosis: Muscle weakness (generalized) (M62.81);History of falling (Z91.81)    Functional Assessment Tool Used: AM-PAC 6 Clicks Basic Mobility Functional Limitation: Mobility: Walking and moving around Mobility: Walking and Moving Around Current Status (Z6109(G8978): 100 percent impaired, limited or restricted Mobility: Walking and Moving Around Goal Status (U0454(G8979): At least 80 percent but less than 100 percent impaired, limited or restricted    Time: 0915-0952 PT Time Calculation (min) (ACUTE ONLY): 37 min   Charges:   PT Evaluation $PT Eval High Complexity: 1 Procedure PT Treatments $Therapeutic Activity: 8-22 mins   PT G  Codes:   PT G-Codes **NOT FOR INPATIENT CLASS** Functional Assessment Tool Used: AM-PAC 6 Clicks Basic Mobility Functional Limitation: Mobility: Walking and moving around Mobility: Walking and Moving Around Current Status (U9811(G8978): 100 percent impaired, limited or restricted Mobility: Walking and Moving Around Goal Status (B1478(G8979): At least 80 percent but less than 100 percent impaired, limited or restricted     Elray McgregorCynthia Katyra Tomassetti 09/13/2016, 12:15 PM  Sheran Lawlessyndi Tyronne Blann, PT [phone] 09/13/2016

## 2016-09-13 NOTE — Clinical Social Work Note (Signed)
CSW met with patient. Daughter, Belenda Cruise, at bedside. CSW introduced role and explained that discharge planning would be discussed. Patient's daughter confirmed that patient is from [facility] but would be returning home with her. Patient's daughter inquired about home health. RNCM notified. CSW confirmed plans with patient's other daughter, Malachy Mood.   CSW signing off. Consult again if any other social work needs arise.  Dayton Scrape, [place]

## 2016-09-14 LAB — BASIC METABOLIC PANEL
ANION GAP: 8 (ref 5–15)
BUN: 25 mg/dL — ABNORMAL HIGH (ref 6–20)
CALCIUM: 8.3 mg/dL — AB (ref 8.9–10.3)
CO2: 20 mmol/L — ABNORMAL LOW (ref 22–32)
Chloride: 106 mmol/L (ref 101–111)
Creatinine, Ser: 1.4 mg/dL — ABNORMAL HIGH (ref 0.44–1.00)
GFR calc non Af Amer: 35 mL/min — ABNORMAL LOW (ref >60)
GFR, EST AFRICAN AMERICAN: 41 mL/min — AB (ref >60)
GLUCOSE: 78 mg/dL (ref 65–99)
POTASSIUM: 4 mmol/L (ref 3.5–5.1)
Sodium: 134 mmol/L — ABNORMAL LOW (ref 135–145)

## 2016-09-14 LAB — GLUCOSE, CAPILLARY
GLUCOSE-CAPILLARY: 85 mg/dL (ref 65–99)
GLUCOSE-CAPILLARY: 82 mg/dL (ref 65–99)
GLUCOSE-CAPILLARY: 76 mg/dL (ref 65–99)
Glucose-Capillary: 84 mg/dL (ref 65–99)
Glucose-Capillary: 90 mg/dL (ref 65–99)
Glucose-Capillary: 76 mg/dL (ref 65–99)

## 2016-09-14 LAB — CBC
HEMATOCRIT: 26 % — AB (ref 36.0–46.0)
Hemoglobin: 8.1 g/dL — ABNORMAL LOW (ref 12.0–15.0)
MCH: 28.7 pg (ref 26.0–34.0)
MCHC: 31.2 g/dL (ref 30.0–36.0)
MCV: 92.2 fL (ref 78.0–100.0)
Platelets: 178 10*3/uL (ref 150–400)
RBC: 2.82 MIL/uL — AB (ref 3.87–5.11)
RDW: 25.5 % — ABNORMAL HIGH (ref 11.5–15.5)
WBC: 8.1 10*3/uL (ref 4.0–10.5)

## 2016-09-14 LAB — URINE CULTURE: Culture: >=100000 — AB

## 2016-09-14 MED ORDER — METOPROLOL TARTRATE 5 MG/5ML IV SOLN
INTRAVENOUS | Status: AC
  Filled 2016-09-14: qty 5

## 2016-09-14 MED ORDER — COLLAGENASE 250 UNIT/GM EX OINT
TOPICAL_OINTMENT | Freq: Every day | CUTANEOUS | Status: DC
  Administered 2016-09-15 – 2016-09-18 (×4): via TOPICAL
  Administered 2016-09-20 – 2016-09-22 (×3): 1 via TOPICAL
  Administered 2016-09-23: 05:00:00 via TOPICAL
  Administered 2016-09-24: 1 via TOPICAL
  Filled 2016-09-14 (×3): qty 30

## 2016-09-14 NOTE — Progress Notes (Signed)
PROGRESS NOTE                                                                                                                                                                                                             Patient Demographics:    Lisa [name], is a 76 y.o. female, DOB - September 26, 1940, ZOX:[id number]  Admit date - 09/11/2016   Admitting Physician Hillary BowJared M Gardner, DO  Outpatient Primary MD for the patient is [name],[name], MD  LOS - 1  Outpatient Specialists:  Dr Lawanna KobusEskridge/ [place]  Chief Complaint  Patient presents with  . Hypoglycemia       Brief Narrative   76 year old female with history of diabetes mellitus, chronic diastolic CHF, CAD status post CABG, paroxysmal A. fib on eliquis, (s/p cardioversion in 07/2015) OSA on CPAP, hypertension, hyperlipidemia, left femur fracture status post ORIF in 08/05/2016, hydronephrosis requiring cystoscopy with retrograde pyelogram and ureteral stent placement during the same hospitalization. She was readmitted on 2/17 for acute on chronic anemia and received 1 unit PRBC, negative Hemoccult but with gross hematuria for several weeks. Patient was at skilled nursing facility where she was found to be confused with strokelike symptoms. CBG was in the 40s and was given D50. Her neurological function had recovered while in the ED. In the ED her hemoglobin was 7.1 with UA again showing frank hematuria. UA was concerning for UTI. She was given a dose of Rocephin and Zyvox (given recent history of VRE). Admitted to hospitalist service.    Subjective:   Still has  some confusion but better oriented today. Says she feels extremely weak   Assessment  & Plan :    Principal Problem:  Acute encephalopathy Likely combination of hypoglycemia and UTI with VRE. Also vicodin making her more sleepy . Will discontinue narcotics and avoid benzos. Continue D5.  Urine culture from admission  again growing VRE. ID consult involved. Started on linezolid. Follow-up with sensitivity.    Anemia, acute on chronic Possibly anemia of chronic disease which has been worsening for past 3 months. Hemoccult negative on multiple occasions. Peripheral smear ordered. Also has gross hematuria for past several weeks. Evaluated by urology and suggested that some degree of hematuria is expected with ureteral stenting and chronic Foley. Consulted hematology. Dr. Mosetta PuttFeng arrange outpatient follow-up within 1-2 weeks. Hemoglobin improved with 1 unit  PRBC on 3/8. - resumed anticoagulation.    Active Problems:   Chronic atrial fibrillation (HCC)  antioagulation was held on admission for anemia and ongoing hematuria. Now resumed.  Chronic kidney disease stage III Renal function actually appears better than previously. Continue to monitor    Femur fracture, left (HCC) Status post ORIF,  5 weeks back. PT evaluation.   ? hypothyroidism High TSH with low T4 on prior labs. Repeat labs normal.  Essential hypertension Continue amlodipine.  Coronary artery disease Continue metoprolol, Imdur, hydralazine, statin.  Code Status : full code  Family Communication  :   updated daughter on the phone  Disposition Plan  : Return to skilled nursing facility possibly early next week.  Barriers For Discharge : active symptoms  Consults  :   Urology neurology ID  Procedures  :  US renal  DVT Prophylaxis  :  - SCDs  Lab Results  Component Value Date   PLT 178 09/14/2016    Antibiotics  :    Anti-infectives    Start     Dose/Rate Route Frequency Ordered Stop   09/13/16 1215  linezolid (ZYVOX) tablet 600 mg     600 mg Oral Every 12 hours 09/13/16 1205     09/13/16 0230  linezolid (ZYVOX) IVPB 600 mg     600 mg 300 mL/hr over 60 Minutes Intravenous STAT 09/13/16 0224 09/13/16 0346   09/12/16 0015  linezolid (ZYVOX) IVPB 600 mg     600 mg 300 mL/hr over 60 Minutes Intravenous  Once 09/11/16  2359 09/12/16 0253   09/12/16 0000  cefTRIAXone (ROCEPHIN) 2 g in dextrose 5 % 50 mL IVPB     2 g 100 mL/hr over 30 Minutes Intravenous  Once 09/11/16 2358 09/12/16 0253        Objective:   Vitals:   09/14/16 0049 09/14/16 0505 09/14/16 1100 09/14/16 1346  BP: 129/80 117/64 125/71 (!) 137/43  Pulse: 77 84 94 97  Resp: 18 18 18 18   Temp: 98 F (36.7 C) 97.4 F (36.3 C) 97.3 F (36.3 C) 97.6 F (36.4 C)  TempSrc: Axillary Axillary Axillary Oral  SpO2: 100% 100% 97% 97%  Weight:      Height:        Wt Readings from Last 3 Encounters:  09/12/16 104.3 kg (229 lb 15 oz)  08/25/16 105 kg (231 lb 7.7 oz)  08/10/16 105.2 kg (232 lb)    No intake or output data in the 24 hours ending 09/14/16 1402   Physical Exam  Gen: not in distress, somnolent HEENT:,P moist mucosa, supple neck Chest: clear b/l, no added sounds CVS: S1 and S2 irregular, no murmurs GI: soft, NT, ND,  Musculoskeletal: warm, no edema CNS: AAOX1-2, sleepy    Data Review:    CBC  Recent Labs Lab 09/11/16 2234 09/11/16 2240 09/12/16 0550 09/13/16 0426 09/14/16 0414  WBC 5.4  --  6.7 8.7 8.1  HGB 7.1* 7.1* 7.5* 8.5* 8.1*  HCT 22.7* 21.0* 24.0* 27.1* 26.0*  PLT 148*  --  187 186 178  MCV 99.1  --  99.6 91.6 92.2  MCH 31.0  --  31.1 28.7 28.7  MCHC 31.3  --  31.3 31.4 31.2  RDW 19.2*  --  19.3* 25.9* 25.5*  LYMPHSABS 1.4  --   --   --   --   MONOABS 0.3  --   --   --   --   EOSABS 0.1  --   --   --   --  BASOSABS 0.0  --   --   --   --     Chemistries   Recent Labs Lab 09/11/16 2234 09/11/16 2240 09/12/16 0550 09/13/16 0426 09/14/16 0414  NA 133* 136 135 136 134*  K 4.9 5.2* 4.2 4.1 4.0  CL 108 108 109 108 106  CO2 19*  --  18* 21* 20*  GLUCOSE 142* 137* 84 75 78  BUN 33* 37* 29* 27* 25*  CREATININE 1.65* 1.70* 1.65* 1.53* 1.40*  CALCIUM 8.1*  --  8.3* 8.4* 8.3*  AST 27  --   --   --   --   ALT 8*  --   --   --   --   ALKPHOS 73  --   --   --   --   BILITOT 1.0  --   --   --    --    ------------------------------------------------------------------------------------------------------------------ No results for input(s): CHOL, HDL, LDLCALC, TRIG, CHOLHDL, LDLDIRECT in the last 72 hours.  Lab Results  Component Value Date   HGBA1C 5.0 05/03/2016   ------------------------------------------------------------------------------------------------------------------  Recent Labs  09/12/16 1430  TSH 2.368   ------------------------------------------------------------------------------------------------------------------ No results for input(s): VITAMINB12, FOLATE, FERRITIN, TIBC, IRON, RETICCTPCT in the last 72 hours.  Coagulation profile  Recent Labs Lab 09/11/16 2234  INR 2.39    No results for input(s): DDIMER in the last 72 hours.  Cardiac Enzymes No results for input(s): CKMB, TROPONINI, MYOGLOBIN in the last 168 hours.  Invalid input(s): CK ------------------------------------------------------------------------------------------------------------------    Component Value Date/Time   BNP 500.3 (H) 08/02/2016 2352    Inpatient Medications  Scheduled Meds: . amLODipine  5 mg Oral Daily  . apixaban  2.5 mg Oral BID  . atorvastatin  10 mg Oral q1800  . budesonide  1 mg Nebulization BID  . cholecalciferol  1,000 Units Oral Daily  . [START ON 09/15/2016] collagenase   Topical Daily  . ferrous sulfate  325 mg Oral TID WC  . hydrALAZINE  25 mg Oral Q8H  . isosorbide mononitrate  30 mg Oral Daily  . linezolid  600 mg Oral Q12H  . metoprolol succinate  50 mg Oral Daily  . oxybutynin  5 mg Oral BID  . pantoprazole  40 mg Oral Daily   Continuous Infusions: . dextrose 5 % and 0.45% NaCl 75 mL/hr at 09/13/16 1619   PRN Meds:.acetaminophen, diphenhydrAMINE, HYDROcodone-acetaminophen, ipratropium-albuterol, polyethylene glycol  Micro Results Recent Results (from the past 240 hour(s))  Urine culture     Status: Abnormal   Collection Time:  09/11/16 11:05 PM  Result Value Ref Range Status   Specimen Description URINE, RANDOM  Final   Special Requests NONE  Final   Culture (A)  Final    >=100,000 COLONIES/mL VANCOMYCIN RESISTANT ENTEROCOCCUS   Report Status 09/14/2016 FINAL  Final   Organism ID, Bacteria VANCOMYCIN RESISTANT ENTEROCOCCUS (A)  Final      Susceptibility   Vancomycin resistant enterococcus - MIC*    AMPICILLIN >=32 RESISTANT Resistant     LEVOFLOXACIN >=8 RESISTANT Resistant     NITROFURANTOIN 256 RESISTANT Resistant     VANCOMYCIN >=32 RESISTANT Resistant     LINEZOLID 2 SENSITIVE Sensitive     * >=100,000 COLONIES/mL VANCOMYCIN RESISTANT ENTEROCOCCUS  Blood culture (routine x 2)     Status: Abnormal (Preliminary result)   Collection Time: 09/12/16  1:05 AM  Result Value Ref Range Status   Specimen Description BLOOD LEFT ARM  Final   Special Requests BOTTLES DRAWN  AEROBIC AND ANAEROBIC  Final   Culture  Setup Time   Final    GRAM POSITIVE COCCI IN CHAINS IN PAIRS IN BOTH AEROBIC AND ANAEROBIC BOTTLES CRITICAL RESULT CALLED TO, READ BACK BY AND VERIFIED WITH: CARON AMEND, PHARMD @0144  09/13/16 MKELLY,MLT    Culture ENTEROCOCCUS FAECALIS (A)  Final   Report Status PENDING  Incomplete  Blood Culture ID Panel (Reflexed)     Status: None   Collection Time: 09/12/16  1:05 AM  Result Value Ref Range Status   Enterococcus species NOT DETECTED NOT DETECTED Final   Vancomycin resistance NOT DETECTED NOT DETECTED Final   Listeria monocytogenes NOT DETECTED NOT DETECTED Final   Staphylococcus species NOT DETECTED NOT DETECTED Final   Staphylococcus aureus NOT DETECTED NOT DETECTED Final   Streptococcus species NOT DETECTED NOT DETECTED Final   Streptococcus agalactiae NOT DETECTED NOT DETECTED Final   Streptococcus pneumoniae NOT DETECTED NOT DETECTED Final   Streptococcus pyogenes NOT DETECTED NOT DETECTED Final   Acinetobacter baumannii NOT DETECTED NOT DETECTED Final   Enterobacteriaceae species NOT  DETECTED NOT DETECTED Final   Enterobacter cloacae complex NOT DETECTED NOT DETECTED Final   Escherichia coli NOT DETECTED NOT DETECTED Final   Klebsiella oxytoca NOT DETECTED NOT DETECTED Final   Klebsiella pneumoniae NOT DETECTED NOT DETECTED Final   Proteus species NOT DETECTED NOT DETECTED Final   Serratia marcescens NOT DETECTED NOT DETECTED Final   Haemophilus influenzae NOT DETECTED NOT DETECTED Final   Neisseria meningitidis NOT DETECTED NOT DETECTED Final   Pseudomonas aeruginosa NOT DETECTED NOT DETECTED Final   Candida albicans NOT DETECTED NOT DETECTED Final   Candida glabrata NOT DETECTED NOT DETECTED Final   Candida krusei NOT DETECTED NOT DETECTED Final   Candida parapsilosis NOT DETECTED NOT DETECTED Final   Candida tropicalis NOT DETECTED NOT DETECTED Final  Blood culture (routine x 2)     Status: None (Preliminary result)   Collection Time: 09/12/16  5:55 AM  Result Value Ref Range Status   Specimen Description BLOOD RIGHT ARM  Final   Special Requests IN PEDIATRIC BOTTLE  2CC  Final   Culture NO GROWTH 2 DAYS  Final   Report Status PENDING  Incomplete    Radiology Reports US Renal  Result Date: 09/12/2016 CLINICAL DATA:  Hematuria EXAM: RENAL / URINARY TRACT ULTRASOUND COMPLETE COMPARISON:  08/04/2016 CT FINDINGS: Right Kidney: Length: 11.2 cm. There is a simple appearing cyst in the lateral aspect of the mid to upper pole measuring 3.7 x 4.6 x 3.7 cm. An additional 2.7 x 2.1 x 2.2 cm upper pole cortical based cyst is also noted. Mild increase in echogenicity of the renal parenchyma. No obstructive uropathy or nephrolithiasis. Left Kidney: Length: 10.2 cm. Echogenicity is increased. No nephrolithiasis. There is an caliectasis of the left renal collecting system with what appears to be extent in the region of the renal pelvis. The degree of dilatation has decreased since prior CT. Bladder: Decompressed by Foley catheter. IMPRESSION: 1. Simple appearing right renal cyst. 2.  Left-sided caliectasis with interval decompression of dilated left renal collecting system presumably by a stent which is partially visualized on these images. 3. Foley decompressed bladder. 4. Mild echogenicity of both kidneys may reflect medical renal disease. Electronically Signed   By: Tollie Eth M.D.   On: 09/12/2016 03:03   Dg Chest Portable 1 View  Result Date: 09/11/2016 CLINICAL DATA:  Mental status change EXAM: PORTABLE CHEST 1 VIEW COMPARISON:  08/02/2016 FINDINGS: Shallow inspiration. Stable  curvilinear scarring. No confluent consolidation. No large effusion. Unchanged cardiomegaly. Normal pulmonary vasculature. IMPRESSION: Stable curvilinear scarring. Unchanged cardiomegaly. No confluent consolidation or large effusion. Electronically Signed   By: Ellery Plunk M.D.   On: 09/11/2016 22:59   Xr Femur Min 2 Views Left  Result Date: 08/28/2016 X-ray femur show satisfactory alignment. Hardware intact after ORIF.  Xr Tibia/fibula Left  Result Date: 08/28/2016 X-ray tib-fib shows good bony anatomy. No acute change. No signs of fracture.  Ct Head Code Stroke W/o Cm  Result Date: 09/11/2016 CLINICAL DATA:  Code stroke. 76 y/o F; left-sided droop and weakness. EXAM: CT HEAD WITHOUT CONTRAST TECHNIQUE: Contiguous axial images were obtained from the base of the skull through the vertex without intravenous contrast. COMPARISON:  CT head 10/15/2012. FINDINGS: Brain: No evidence of acute infarction, hemorrhage, hydrocephalus, extra-axial collection or mass lesion/mass effect. Stable foci of hypoattenuation in subcortical and periventricular white matter compatible with microvascular ischemic changes. Stable lucencies in right caudate body, left caudate head, left lentiform nucleus, and left thalamus compatible with chronic lacunar infarcts. Vascular: No hyperdense vessel identified. Extensive calcific atherosclerosis of cavernous and paraclinoid internal carotid arteries. Skull: Normal. Negative  for fracture or focal lesion. Sinuses/Orbits: No acute finding. Other: None. ASPECTS [facility] Stroke Program Early CT Score) - Ganglionic level infarction (caudate, lentiform nuclei, internal capsule, insula, M1-M3 cortex): 7 - Supraganglionic infarction (M4-M6 cortex): 3 Total score (0-10 with 10 being normal): 10 IMPRESSION: 1. No acute intracranial abnormality identified. 2. ASPECTS is 10 3. Stable chronic microvascular ischemic changes, parenchymal volume loss, and basal ganglia chronic lacunar infarcts. These results were called by telephone at the time of interpretation on 09/11/2016 at 10:35 pm to Dr. Charm Barges, who verbally acknowledged these results. Electronically Signed   By: Mitzi Hansen M.D.   On: 09/11/2016 22:35    Time Spent in minutes  35   Eddie North M.D on 09/14/2016 at 2:02 PM  Between 7am to 7pm - Pager - [phone]  After 7pm go to [email or web address] - password [facility]  [facility] -  Office  [phone]

## 2016-09-15 LAB — GLUCOSE, CAPILLARY
GLUCOSE-CAPILLARY: 76 mg/dL (ref 65–99)
GLUCOSE-CAPILLARY: 79 mg/dL (ref 65–99)
GLUCOSE-CAPILLARY: 83 mg/dL (ref 65–99)
Glucose-Capillary: 75 mg/dL (ref 65–99)
Glucose-Capillary: 86 mg/dL (ref 65–99)
Glucose-Capillary: 84 mg/dL (ref 65–99)

## 2016-09-15 LAB — BASIC METABOLIC PANEL
Anion gap: 6 (ref 5–15)
BUN: 22 mg/dL — ABNORMAL HIGH (ref 6–20)
CO2: 20 mmol/L — ABNORMAL LOW (ref 22–32)
Calcium: 8.2 mg/dL — ABNORMAL LOW (ref 8.9–10.3)
Chloride: 108 mmol/L (ref 101–111)
Creatinine, Ser: 1.3 mg/dL — ABNORMAL HIGH (ref 0.44–1.00)
GFR calc Af Amer: 45 mL/min — ABNORMAL LOW (ref >60)
GFR calc non Af Amer: 39 mL/min — ABNORMAL LOW (ref >60)
Glucose, Bld: 77 mg/dL (ref 65–99)
Potassium: 4 mmol/L (ref 3.5–5.1)
Sodium: 134 mmol/L — ABNORMAL LOW (ref 135–145)

## 2016-09-15 LAB — CBC
HCT: 25.3 % — ABNORMAL LOW (ref 36.0–46.0)
Hemoglobin: 8 g/dL — ABNORMAL LOW (ref 12.0–15.0)
MCH: 29.3 pg (ref 26.0–34.0)
MCHC: 31.6 g/dL (ref 30.0–36.0)
MCV: 92.7 fL (ref 78.0–100.0)
PLATELETS: 166 10*3/uL (ref 150–400)
RBC: 2.73 MIL/uL — ABNORMAL LOW (ref 3.87–5.11)
RDW: 25 % — AB (ref 11.5–15.5)
WBC: 7.7 10*3/uL (ref 4.0–10.5)

## 2016-09-15 LAB — CULTURE, BLOOD (ROUTINE X 2)

## 2016-09-15 LAB — AMMONIA: Ammonia: 70 umol/L — ABNORMAL HIGH (ref 9–35)

## 2016-09-15 MED ORDER — WHITE PETROLATUM GEL
Status: AC
  Administered 2016-09-15: 12:00:00
  Filled 2016-09-15: qty 1

## 2016-09-15 MED ORDER — LACTULOSE ENEMA
300.0000 mL | Freq: Once | ORAL | Status: AC
  Administered 2016-09-15: 300 mL via RECTAL
  Filled 2016-09-15: qty 300

## 2016-09-15 NOTE — Progress Notes (Addendum)
PROGRESS NOTE                                                                                                                                                                                                             Patient Demographics:    Lisa [name], is a 76 y.o. female, DOB - [birth year], YNW:[id number]  Admit date - 09/11/2016   Admitting Physician Hillary BowJared M Gardner, DO  Outpatient Primary MD for the patient is [name],[name], MD  LOS - 2  Outpatient Specialists:  Dr Lawanna KobusEskridge/ [place]  Chief Complaint  Patient presents with  . Hypoglycemia       Brief Narrative   76 year old female with history of diabetes mellitus, chronic diastolic CHF, CAD status post CABG, paroxysmal A. fib on eliquis, (s/p cardioversion in 07/2015) OSA on CPAP, hypertension, hyperlipidemia, left femur fracture status post ORIF in 08/05/2016, hydronephrosis requiring cystoscopy with retrograde pyelogram and ureteral stent placement during the same hospitalization. She was readmitted on 2/17 for acute on chronic anemia and received 1 unit PRBC, negative Hemoccult but with gross hematuria for several weeks. Patient was at skilled nursing facility where she was found to be confused with strokelike symptoms. CBG was in the 40s and was given D50. Her neurological function had recovered while in the ED. In the ED her hemoglobin was 7.1 with UA again showing frank hematuria. UA was concerning for UTI. She was given a dose of Rocephin and Zyvox (given recent history of VRE). Admitted to hospitalist service.    Subjective:   Somnolent and shaking. Very confused today   Assessment  & Plan :    Principal Problem:  Acute encephalopathy Likely combination of hypoglycemia and UTI with VRE. Have discontinue narcotics and avoid benzodiazepine. Appears more somnolent today. Follow labs. Recent TSH and  b12 normal. Check ammonia.  Continue D5.  Urine  culture from admission again growing VRE. ID consult appreciated.  Started on linezolid. Follow-up with sensitivity.    Anemia, acute on chronic Possibly anemia of chronic disease which has been worsening for past 3 months. Hemoccult negative on multiple occasions. Peripheral smear ordered. Also has gross hematuria for past several weeks. Evaluated by urology and suggested that some degree of hematuria is expected with ureteral stenting and chronic Foley. Consulted hematology. Dr. Mosetta PuttFeng arrange outpatient follow-up within 1-2 weeks. Hemoglobin improved with 1  unit PRBC on 3/8. - resumed anticoagulation.    Active Problems:   Chronic atrial fibrillation (HCC)  antioagulation was held on admission for anemia and ongoing hematuria. Now resumed.  Chronic kidney disease stage III Renal function actually appears better than previously. Continue to monitor    Femur fracture, left (HCC) Status post ORIF,  5 weeks back. PT evaluation.   ? hypothyroidism High TSH with low T4 on prior labs. Repeat labs normal.  Essential hypertension Continue amlodipine.  Coronary artery disease Continue metoprolol, Imdur, hydralazine, statin.  Code Status : full code  Family Communication  :   updated daughter on the phone.  Disposition Plan  : Return to skilled nursing facility once mentation better.  Barriers For Discharge : active symptoms  Consults  :   Urology neurology ID  Procedures  :  US renal  DVT Prophylaxis  :  - SCDs  Lab Results  Component Value Date   PLT 178 09/14/2016    Antibiotics  :    Anti-infectives    Start     Dose/Rate Route Frequency Ordered Stop   09/13/16 1215  linezolid (ZYVOX) tablet 600 mg     600 mg Oral Every 12 hours 09/13/16 1205     09/13/16 0230  linezolid (ZYVOX) IVPB 600 mg     600 mg 300 mL/hr over 60 Minutes Intravenous STAT 09/13/16 0224 09/13/16 0346   09/12/16 0015  linezolid (ZYVOX) IVPB 600 mg     600 mg 300 mL/hr over 60 Minutes  Intravenous  Once 09/11/16 2359 09/12/16 0253   09/12/16 0000  cefTRIAXone (ROCEPHIN) 2 g in dextrose 5 % 50 mL IVPB     2 g 100 mL/hr over 30 Minutes Intravenous  Once 09/11/16 2358 09/12/16 0253        Objective:   Vitals:   09/14/16 2021 09/15/16 0100 09/15/16 0500 09/15/16 0953  BP: 128/76 (!) 118/93 125/88 110/80  Pulse: 88 92 98 94  Resp: 17 18 18 20   Temp: 98.5 F (36.9 C) 98.4 F (36.9 C) 98.7 F (37.1 C) 98.8 F (37.1 C)  TempSrc: Oral Oral Oral Oral  SpO2: 99% 100% 97% 98%  Weight:      Height:        Wt Readings from Last 3 Encounters:  09/12/16 104.3 kg (229 lb 15 oz)  08/25/16 105 kg (231 lb 7.7 oz)  08/10/16 105.2 kg (232 lb)     Intake/Output Summary (Last 24 hours) at 09/15/16 1032 Last data filed at 09/15/16 0500  Gross per 24 hour  Intake                0 ml  Output              550 ml  Net             -550 ml     Physical Exam  Gen:  somnolent HEENT:  moist mucosa, supple neck Chest: clear b/l, no added sounds CVS: S1 and S2 irregular, no murmurs GI: soft, NT, ND,  Musculoskeletal: warm, no edema, dark urine in foley. CNS: AAOX0 somnolent    Data Review:    CBC  Recent Labs Lab 09/11/16 2234 09/11/16 2240 09/12/16 0550 09/13/16 0426 09/14/16 0414  WBC 5.4  --  6.7 8.7 8.1  HGB 7.1* 7.1* 7.5* 8.5* 8.1*  HCT 22.7* 21.0* 24.0* 27.1* 26.0*  PLT 148*  --  187 186 178  MCV 99.1  --  99.6 91.6 92.2  MCH 31.0  --  31.1 28.7 28.7  MCHC 31.3  --  31.3 31.4 31.2  RDW 19.2*  --  19.3* 25.9* 25.5*  LYMPHSABS 1.4  --   --   --   --   MONOABS 0.3  --   --   --   --   EOSABS 0.1  --   --   --   --   BASOSABS 0.0  --   --   --   --     Chemistries   Recent Labs Lab 09/11/16 2234 09/11/16 2240 09/12/16 0550 09/13/16 0426 09/14/16 0414  NA 133* 136 135 136 134*  K 4.9 5.2* 4.2 4.1 4.0  CL 108 108 109 108 106  CO2 19*  --  18* 21* 20*  GLUCOSE 142* 137* 84 75 78  BUN 33* 37* 29* 27* 25*  CREATININE 1.65* 1.70* 1.65* 1.53*  1.40*  CALCIUM 8.1*  --  8.3* 8.4* 8.3*  AST 27  --   --   --   --   ALT 8*  --   --   --   --   ALKPHOS 73  --   --   --   --   BILITOT 1.0  --   --   --   --    ------------------------------------------------------------------------------------------------------------------ No results for input(s): CHOL, HDL, LDLCALC, TRIG, CHOLHDL, LDLDIRECT in the last 72 hours.  Lab Results  Component Value Date   HGBA1C 5.0 05/03/2016   ------------------------------------------------------------------------------------------------------------------  Recent Labs  09/12/16 1430  TSH 2.368   ------------------------------------------------------------------------------------------------------------------ No results for input(s): VITAMINB12, FOLATE, FERRITIN, TIBC, IRON, RETICCTPCT in the last 72 hours.  Coagulation profile  Recent Labs Lab 09/11/16 2234  INR 2.39    No results for input(s): DDIMER in the last 72 hours.  Cardiac Enzymes No results for input(s): CKMB, TROPONINI, MYOGLOBIN in the last 168 hours.  Invalid input(s): CK ------------------------------------------------------------------------------------------------------------------    Component Value Date/Time   BNP 500.3 (H) 08/02/2016 2352    Inpatient Medications  Scheduled Meds: . amLODipine  5 mg Oral Daily  . apixaban  2.5 mg Oral BID  . atorvastatin  10 mg Oral q1800  . budesonide  1 mg Nebulization BID  . cholecalciferol  1,000 Units Oral Daily  . collagenase   Topical Daily  . ferrous sulfate  325 mg Oral TID WC  . hydrALAZINE  25 mg Oral Q8H  . isosorbide mononitrate  30 mg Oral Daily  . linezolid  600 mg Oral Q12H  . metoprolol succinate  50 mg Oral Daily  . oxybutynin  5 mg Oral BID  . pantoprazole  40 mg Oral Daily   Continuous Infusions: . dextrose 5 % and 0.45% NaCl 75 mL/hr at 09/15/16 0618   PRN Meds:.acetaminophen, diphenhydrAMINE, ipratropium-albuterol, polyethylene glycol  Micro  Results Recent Results (from the past 240 hour(s))  Urine culture     Status: Abnormal   Collection Time: 09/11/16 11:05 PM  Result Value Ref Range Status   Specimen Description URINE, RANDOM  Final   Special Requests NONE  Final   Culture (A)  Final    >=100,000 COLONIES/mL VANCOMYCIN RESISTANT ENTEROCOCCUS   Report Status 09/14/2016 FINAL  Final   Organism ID, Bacteria VANCOMYCIN RESISTANT ENTEROCOCCUS (A)  Final      Susceptibility   Vancomycin resistant enterococcus - MIC*    AMPICILLIN >=32 RESISTANT Resistant     LEVOFLOXACIN >=8 RESISTANT Resistant     NITROFURANTOIN 256 RESISTANT Resistant  VANCOMYCIN >=32 RESISTANT Resistant     LINEZOLID 2 SENSITIVE Sensitive     * >=100,000 COLONIES/mL VANCOMYCIN RESISTANT ENTEROCOCCUS  Blood culture (routine x 2)     Status: Abnormal   Collection Time: 09/12/16  1:05 AM  Result Value Ref Range Status   Specimen Description BLOOD LEFT ARM  Final   Special Requests BOTTLES DRAWN AEROBIC AND ANAEROBIC  Final   Culture  Setup Time   Final    GRAM POSITIVE COCCI IN CHAINS IN PAIRS IN BOTH AEROBIC AND ANAEROBIC BOTTLES CRITICAL RESULT CALLED TO, READ BACK BY AND VERIFIED WITH: CARON AMEND, PHARMD @0144  09/13/16 MKELLY,MLT    Culture ENTEROCOCCUS FAECALIS (A)  Final   Report Status 09/15/2016 FINAL  Final   Organism ID, Bacteria ENTEROCOCCUS FAECALIS  Final      Susceptibility   Enterococcus faecalis - MIC*    AMPICILLIN <=2 SENSITIVE Sensitive     VANCOMYCIN 1 SENSITIVE Sensitive     GENTAMICIN SYNERGY RESISTANT Resistant     * ENTEROCOCCUS FAECALIS  Blood Culture ID Panel (Reflexed)     Status: None   Collection Time: 09/12/16  1:05 AM  Result Value Ref Range Status   Enterococcus species NOT DETECTED NOT DETECTED Final   Vancomycin resistance NOT DETECTED NOT DETECTED Final   Listeria monocytogenes NOT DETECTED NOT DETECTED Final   Staphylococcus species NOT DETECTED NOT DETECTED Final   Staphylococcus aureus NOT DETECTED  NOT DETECTED Final   Streptococcus species NOT DETECTED NOT DETECTED Final   Streptococcus agalactiae NOT DETECTED NOT DETECTED Final   Streptococcus pneumoniae NOT DETECTED NOT DETECTED Final   Streptococcus pyogenes NOT DETECTED NOT DETECTED Final   Acinetobacter baumannii NOT DETECTED NOT DETECTED Final   Enterobacteriaceae species NOT DETECTED NOT DETECTED Final   Enterobacter cloacae complex NOT DETECTED NOT DETECTED Final   Escherichia coli NOT DETECTED NOT DETECTED Final   Klebsiella oxytoca NOT DETECTED NOT DETECTED Final   Klebsiella pneumoniae NOT DETECTED NOT DETECTED Final   Proteus species NOT DETECTED NOT DETECTED Final   Serratia marcescens NOT DETECTED NOT DETECTED Final   Haemophilus influenzae NOT DETECTED NOT DETECTED Final   Neisseria meningitidis NOT DETECTED NOT DETECTED Final   Pseudomonas aeruginosa NOT DETECTED NOT DETECTED Final   Candida albicans NOT DETECTED NOT DETECTED Final   Candida glabrata NOT DETECTED NOT DETECTED Final   Candida krusei NOT DETECTED NOT DETECTED Final   Candida parapsilosis NOT DETECTED NOT DETECTED Final   Candida tropicalis NOT DETECTED NOT DETECTED Final  Blood culture (routine x 2)     Status: None (Preliminary result)   Collection Time: 09/12/16  5:55 AM  Result Value Ref Range Status   Specimen Description BLOOD RIGHT ARM  Final   Special Requests IN PEDIATRIC BOTTLE  2CC  Final   Culture NO GROWTH 2 DAYS  Final   Report Status PENDING  Incomplete    Radiology Reports US Renal  Result Date: 09/12/2016 CLINICAL DATA:  Hematuria EXAM: RENAL / URINARY TRACT ULTRASOUND COMPLETE COMPARISON:  08/04/2016 CT FINDINGS: Right Kidney: Length: 11.2 cm. There is a simple appearing cyst in the lateral aspect of the mid to upper pole measuring 3.7 x 4.6 x 3.7 cm. An additional 2.7 x 2.1 x 2.2 cm upper pole cortical based cyst is also noted. Mild increase in echogenicity of the renal parenchyma. No obstructive uropathy or nephrolithiasis. Left  Kidney: Length: 10.2 cm. Echogenicity is increased. No nephrolithiasis. There is an caliectasis of the left renal collecting system  with what appears to be extent in the region of the renal pelvis. The degree of dilatation has decreased since prior CT. Bladder: Decompressed by Foley catheter. IMPRESSION: 1. Simple appearing right renal cyst. 2. Left-sided caliectasis with interval decompression of dilated left renal collecting system presumably by a stent which is partially visualized on these images. 3. Foley decompressed bladder. 4. Mild echogenicity of both kidneys may reflect medical renal disease. Electronically Signed   By: Tollie Eth M.D.   On: 09/12/2016 03:03   Dg Chest Portable 1 View  Result Date: 09/11/2016 CLINICAL DATA:  Mental status change EXAM: PORTABLE CHEST 1 VIEW COMPARISON:  08/02/2016 FINDINGS: Shallow inspiration. Stable curvilinear scarring. No confluent consolidation. No large effusion. Unchanged cardiomegaly. Normal pulmonary vasculature. IMPRESSION: Stable curvilinear scarring. Unchanged cardiomegaly. No confluent consolidation or large effusion. Electronically Signed   By: Ellery Plunk M.D.   On: 09/11/2016 22:59   Xr Femur Min 2 Views Left  Result Date: 08/28/2016 X-ray femur show satisfactory alignment. Hardware intact after ORIF.  Xr Tibia/fibula Left  Result Date: 08/28/2016 X-ray tib-fib shows good bony anatomy. No acute change. No signs of fracture.  Ct Head Code Stroke W/o Cm  Result Date: 09/11/2016 CLINICAL DATA:  Code stroke. 76 y/o F; left-sided droop and weakness. EXAM: CT HEAD WITHOUT CONTRAST TECHNIQUE: Contiguous axial images were obtained from the base of the skull through the vertex without intravenous contrast. COMPARISON:  CT head 10/15/2012. FINDINGS: Brain: No evidence of acute infarction, hemorrhage, hydrocephalus, extra-axial collection or mass lesion/mass effect. Stable foci of hypoattenuation in subcortical and periventricular white matter  compatible with microvascular ischemic changes. Stable lucencies in right caudate body, left caudate head, left lentiform nucleus, and left thalamus compatible with chronic lacunar infarcts. Vascular: No hyperdense vessel identified. Extensive calcific atherosclerosis of cavernous and paraclinoid internal carotid arteries. Skull: Normal. Negative for fracture or focal lesion. Sinuses/Orbits: No acute finding. Other: None. ASPECTS [facility] Stroke Program Early CT Score) - Ganglionic level infarction (caudate, lentiform nuclei, internal capsule, insula, M1-M3 cortex): 7 - Supraganglionic infarction (M4-M6 cortex): 3 Total score (0-10 with 10 being normal): 10 IMPRESSION: 1. No acute intracranial abnormality identified. 2. ASPECTS is 10 3. Stable chronic microvascular ischemic changes, parenchymal volume loss, and basal ganglia chronic lacunar infarcts. These results were called by telephone at the time of interpretation on 09/11/2016 at 10:35 pm to Dr. Charm Barges, who verbally acknowledged these results. Electronically Signed   By: Mitzi Hansen M.D.   On: 09/11/2016 22:35    Time Spent in minutes  35   Eddie North M.D on 09/15/2016 at 10:32 AM  Between 7am to 7pm - Pager - [phone]  After 7pm go to [email or web address] - password [facility]  [facility] -  Office  [phone]

## 2016-09-16 ENCOUNTER — Inpatient Hospital Stay (HOSPITAL_COMMUNITY): Payer: Medicare Other

## 2016-09-16 ENCOUNTER — Inpatient Hospital Stay (HOSPITAL_COMMUNITY)
Admit: 2016-09-16 | Discharge: 2016-09-16 | Disposition: A | Discharge status: Home / Self Care | Payer: Medicare Other | Attending: Internal Medicine | Admitting: Internal Medicine

## 2016-09-16 ENCOUNTER — Other Ambulatory Visit: Payer: Self-pay | Admitting: *Deleted

## 2016-09-16 DIAGNOSIS — R4182 Altered mental status, unspecified: Secondary | ICD-10-CM

## 2016-09-16 DIAGNOSIS — G9341 Metabolic encephalopathy: Secondary | ICD-10-CM

## 2016-09-16 LAB — GLUCOSE, CAPILLARY
GLUCOSE-CAPILLARY: 80 mg/dL (ref 65–99)
GLUCOSE-CAPILLARY: 88 mg/dL (ref 65–99)
GLUCOSE-CAPILLARY: 61 mg/dL — AB (ref 65–99)
GLUCOSE-CAPILLARY: 80 mg/dL (ref 65–99)
GLUCOSE-CAPILLARY: 80 mg/dL (ref 65–99)
Glucose-Capillary: 82 mg/dL (ref 65–99)
Glucose-Capillary: 68 mg/dL (ref 65–99)

## 2016-09-16 LAB — BASIC METABOLIC PANEL
Anion gap: 6 (ref 5–15)
BUN: 19 mg/dL (ref 6–20)
CALCIUM: 8.4 mg/dL — AB (ref 8.9–10.3)
CO2: 19 mmol/L — ABNORMAL LOW (ref 22–32)
CREATININE: 1.19 mg/dL — AB (ref 0.44–1.00)
Chloride: 109 mmol/L (ref 101–111)
GFR calc Af Amer: 50 mL/min — ABNORMAL LOW (ref >60)
GFR, EST NON AFRICAN AMERICAN: 43 mL/min — AB (ref >60)
GLUCOSE: 77 mg/dL (ref 65–99)
POTASSIUM: 3.8 mmol/L (ref 3.5–5.1)
Sodium: 134 mmol/L — ABNORMAL LOW (ref 135–145)

## 2016-09-16 LAB — AMMONIA: AMMONIA: 32 umol/L (ref 9–35)

## 2016-09-16 MED ORDER — HYDRALAZINE HCL 20 MG/ML IJ SOLN
5.0000 mg | Freq: Once | INTRAMUSCULAR | Status: AC
  Administered 2016-09-16: 5 mg via INTRAVENOUS
  Filled 2016-09-16: qty 1

## 2016-09-16 MED ORDER — MAGIC MOUTHWASH
5.0000 mL | Freq: Four times a day (QID) | ORAL | Status: DC | PRN
  Administered 2016-09-16 (×2): 5 mL via ORAL
  Filled 2016-09-16 (×2): qty 5

## 2016-09-16 MED ORDER — JUVEN PO PACK
1.0000 | PACK | Freq: Two times a day (BID) | ORAL | Status: DC
  Administered 2016-09-20 – 2016-09-22 (×4): 1 via ORAL
  Filled 2016-09-16 (×12): qty 1

## 2016-09-16 MED ORDER — ADULT MULTIVITAMIN W/MINERALS CH
1.0000 | ORAL_TABLET | Freq: Every day | ORAL | Status: DC
  Administered 2016-09-19 – 2016-09-22 (×4): 1 via ORAL
  Filled 2016-09-16 (×4): qty 1

## 2016-09-16 MED ORDER — ENSURE ENLIVE PO LIQD
237.0000 mL | Freq: Two times a day (BID) | ORAL | Status: DC
  Administered 2016-09-20 – 2016-09-24 (×4): 237 mL via ORAL

## 2016-09-16 NOTE — Care Management Note (Signed)
Case Management Note  Patient Details  Name: Lisa [name] MRN: [id number] Date of Birth: 02/26/1941  Subjective/Objective:                    Action/Plan: Per notes, [facility]. CM following for further d/c needs.   Expected Discharge Date:                  Expected Discharge Plan:  Home w Home Health Services  In-House Referral:  Clinical Social Work  Discharge planning Services  CM Consult  Post Acute Care Choice:  Durable Medical Equipment, Home Health Choice offered to:  NA, Adult Children  DME Arranged:  Hospital bed, Oxygen DME Agency:  [facility]  HH Arranged:    HH Agency:   [facility])  Status of Service:  In process, will continue to follow  If discussed at Long Length of Stay Meetings, dates discussed:    Additional Comments:  Kermit BaloKelli F Zanetta Dehaan, RN 09/16/2016, 12:54 PM

## 2016-09-16 NOTE — Progress Notes (Signed)
EEG completed, results pending. 

## 2016-09-16 NOTE — Patient Outreach (Signed)
[facility] Care Management  09/16/2016  [name] 07/15/1940 [id number]   Follow up phone call to patient's daughter, Elnita MaxwellCheryl to discuss discharge plan. Per patient's daughter Elnita MaxwellCheryl, she and her sister Natalia Leatherwoodkatherine are not in agreement with patient's discharge plan. Elnita MaxwellCheryl feels that patient needs long term care, however daughter Natalia LeatherwoodKatherine wants patient to discharge home. Per Myrtie Hawkheryl, [name] ,[place] plans to have a friend of hers provide care for patient in the home at night, however there is concern that the person identified reportedly has no medical background. [facility]. This social worker in agreement that patient would benefit form a goals of care meeting.    Adriana ReamsChrystal Land, LCSW [facility] Care [facility] [phone]

## 2016-09-16 NOTE — Consult Note (Signed)
   [facility] CM In[facility] Consult   09/16/2016  [name] Chouinard 26-Feb-1941 [id number]     [facility]. Chart reviewed. Noted discharge plan changed to home with home health instead of SNF. [facility]. Due to frequent hospitalizations and patient decline, [name] could benefit from a goals of care meeting. Text paged MD to request goals of care if agreeable. Spoke with inpatient RNCM to make aware of above.   Raiford NobleAtika Hall, MSN-Ed, RN,BSN [facility] Care Management Hospital Liaison [phone]

## 2016-09-16 NOTE — Progress Notes (Signed)
RT attempted to give pulmicort neb and check pulse ox. Pt ripped off neb tx and did not allow RT to check pulse ox.

## 2016-09-16 NOTE — Progress Notes (Addendum)
Initial Nutrition Assessment  DOCUMENTATION CODES:   Obesity unspecified  INTERVENTION:  Provide Ensure Enlive po BID, each supplement provides 350 kcal and 20 grams of protein Provide Juven BID Multivitamin with minerals Magic Cup ice cream with meds  NUTRITION DIAGNOSIS:   Increased nutrient needs related to wound healing as evidenced by estimated needs.  NUTRITION GOAL:   Patient will meet greater than or equal to 90% of their needs   MONITOR:   PO intake, Supplement acceptance, Skin, Weight trends, Labs, I & O's  REASON FOR ASSESSMENT:   Consult Poor PO  ASSESSMENT:   76 year old female with history of diabetes mellitus, chronic diastolic CHF, CAD status post CABG, paroxysmal A. fib on eliquis, (s/p cardioversion in 07/2015) OSA on CPAP, hypertension, hyperlipidemia, left femur fracture status post ORIF in 08/05/2016, hydronephrosis requiring cystoscopy with retrograde pyelogram and ureteral stent placement during the same hospitalization. She was readmitted on 2/17 for acute on chronic anemia, negative Hemoccult but with gross hematuria for several weeks.  RN reports that it has been very difficult to get patient to eat or drink anything except for ice water all weekend. Pt will eats a few bites of yogurt or applesauce occasionally. Per RN, pt was more oriented this morning and complaining of mouth pain; pt's mouth is red and inflamed with a few sores on tongue.  Pt getting prepped for Stat CT scan at time of visit. Pt asked to be left alone because she was not "right in the head". She stated that the only thing she wanted to eat or drink was ice water.   Labs: low sodium, low calcium, elevated ammonia, low hemoglobin  Diet Order:  Diet regular Room service appropriate? Yes; Fluid consistency: Thin  Skin:  Wound (see comment) (Stage II PI's on L/R thigh and L/R buttocks)  Last BM:  3/11  Height:   Ht Readings from Last 1 Encounters:  09/12/16 5\' 5"  (1.651 m)     Weight:   Wt Readings from Last 1 Encounters:  09/12/16 229 lb 15 oz (104.3 kg)    Ideal Body Weight:  56.8 kg  BMI:  Body mass index is 38.26 kg/m.  Estimated Nutritional Needs:   Kcal:  1900-2080  Protein:  110-120 grams  Fluid:  2.1 L/day  EDUCATION NEEDS:   No education needs identified at this time  Dorothea Ogleeanne Auren Valdes RD, LDN, CSP Inpatient Clinical Dietitian Pager: [phone] After Hours Pager: [phone]

## 2016-09-16 NOTE — Progress Notes (Signed)
Patient has been resting threw out the day; is arousable to her name; speaks briefly; refuses to eat or drink; she is biting her tongue and lips; multiple sores on her tongue; magic mouthwash started this afternoon; agitated when approached spits medications back out; refuses; foley cath continues with blood tinged urine; patient on air mattress; repositioned frequently; patient resting but easily agitated. Spoke with patient's daughter earlier today and reported tests she will receive today.

## 2016-09-16 NOTE — Progress Notes (Signed)
PROGRESS NOTE                                                                                                                                                                                                             Patient Demographics:    Lisa [name], is a 76 y.o. female, DOB - 02-Mar-1941, ZOX:[id number]  Admit date - 09/11/2016   Admitting Physician Hillary Bow, DO  Outpatient Primary MD for the patient is Laurena Slimmer, MD  LOS - 3  Outpatient Specialists:  Dr Lawanna Kobus  Chief Complaint  Patient presents with  . Hypoglycemia       Brief Narrative   76 year old female with history of diabetes mellitus, chronic diastolic CHF, CAD status post CABG, paroxysmal A. fib on eliquis, (s/p cardioversion in 07/2015) OSA on CPAP, hypertension, hyperlipidemia, left femur fracture status post ORIF in 08/05/2016, hydronephrosis requiring cystoscopy with retrograde pyelogram and ureteral stent placement during the same hospitalization. She was readmitted on 2/17 for acute on chronic anemia and received 1 unit PRBC, negative Hemoccult but with gross hematuria for several weeks. Patient was at skilled nursing facility where she was found to be confused with strokelike symptoms. CBG was in the 40s and was given D50. Her neurological function had recovered while in the ED. In the ED her hemoglobin was 7.1 with UA again showing frank hematuria. UA was concerning for UTI. She was given a dose of Rocephin and Zyvox (given recent history of VRE). Admitted to hospitalist service.    Subjective:   Remains confused and restless. Refusing meds. Afebrile.   Assessment  & Plan :    Principal Problem:  Acute metabolic encephalopathy Prolonged symptoms. Likely combination of hypoglycemia and UTI with VRE. Also has enterococcal bacteremia. ( 1/2 blood cx from admission).  I think Sundowning is also on terbutaline. Have  discontinue narcotics and avoid benzodiazepine. Received lactulose for hyperammonemia. monitor electrolytes closely. Recent TSH and  b12 normal. Check ammonia.  Continue D5.  Urine culture from admission again growing VRE. ID consult appreciated.  Started on linezolid.  Repeat blood cx to see for clearing. Check CT head and EEG.     Anemia, acute on chronic Possibly anemia of chronic disease which has been worsening for past 3 months. Hemoccult negative on multiple occasions. Peripheral smear ordered. Also has gross hematuria for past  several weeks. Evaluated by urology and suggested that some degree of hematuria is expected with ureteral stenting and chronic Foley. Consulted hematology. Dr. Mosetta Putt arrange outpatient follow-up within 1-2 weeks. Hemoglobin improved with 1 unit PRBC on 3/8. - resumed anticoagulation.    Active Problems:   Chronic atrial fibrillation (HCC)  antioagulation was held on admission for anemia and ongoing hematuria. Now resumed.  Chronic kidney disease stage III Renal function actually appears better than previously. Continue to monitor    Femur fracture, left (HCC) Status post ORIF,  5 weeks back. PT evaluation.   ? hypothyroidism High TSH with low T4 on prior labs. Repeat labs normal.  Essential hypertension Continue amlodipine.  Coronary artery disease Continue metoprolol, Imdur, hydralazine, statin.  Code Status : full code  Family Communication  :   updated daughter on the phone.  Disposition Plan  : Return to skilled nursing facility once mentation better.  Barriers For Discharge : active symptoms  Consults  :   Urology neurology ID  Procedures  :  US renal CT head and EEG ordered.  DVT Prophylaxis  :  - SCDs  Lab Results  Component Value Date   PLT 166 09/15/2016    Antibiotics  :    Anti-infectives    Start     Dose/Rate Route Frequency Ordered Stop   09/13/16 1215  linezolid (ZYVOX) tablet 600 mg     600 mg Oral Every  12 hours 09/13/16 1205     09/13/16 0230  linezolid (ZYVOX) IVPB 600 mg     600 mg 300 mL/hr over 60 Minutes Intravenous STAT 09/13/16 0224 09/13/16 0346   09/12/16 0015  linezolid (ZYVOX) IVPB 600 mg     600 mg 300 mL/hr over 60 Minutes Intravenous  Once 09/11/16 2359 09/12/16 0253   09/12/16 0000  cefTRIAXone (ROCEPHIN) 2 g in dextrose 5 % 50 mL IVPB     2 g 100 mL/hr over 30 Minutes Intravenous  Once 09/11/16 2358 09/12/16 0253        Objective:   Vitals:   09/16/16 0927 09/16/16 1117 09/16/16 1125 09/16/16 1340  BP: (!) 96/35 100/74 119/64 (!) 140/94  Pulse: (!) 107 (!) 103 100 98  Resp: 20 20 20 20   Temp: 98.4 F (36.9 C)   98.3 F (36.8 C)  TempSrc: Axillary   Axillary  SpO2: 98% 96% 100% 98%  Weight:      Height:        Wt Readings from Last 3 Encounters:  09/12/16 104.3 kg (229 lb 15 oz)  08/25/16 105 kg (231 lb 7.7 oz)  08/10/16 105.2 kg (232 lb)     Intake/Output Summary (Last 24 hours) at 09/16/16 1348 Last data filed at 09/16/16 0527  Gross per 24 hour  Intake                0 ml  Output             1000 ml  Net            -1000 ml     Physical Exam  Gen:  Somnolent and confused HEENT:  moist mucosa, supple neck Chest: clear b/l, no added sounds CVS: S1 and S2 irregular, no murmurs GI: soft, NT, ND,  Musculoskeletal: warm, no edema, dark urine in foley. CNS: AAOX0  confused    Data Review:    CBC  Recent Labs Lab 09/11/16 2234 09/11/16 2240 09/12/16 0550 09/13/16 0426 09/14/16 0414 09/15/16 1014  WBC  5.4  --  6.7 8.7 8.1 7.7  HGB 7.1* 7.1* 7.5* 8.5* 8.1* 8.0*  HCT 22.7* 21.0* 24.0* 27.1* 26.0* 25.3*  PLT 148*  --  187 186 178 166  MCV 99.1  --  99.6 91.6 92.2 92.7  MCH 31.0  --  31.1 28.7 28.7 29.3  MCHC 31.3  --  31.3 31.4 31.2 31.6  RDW 19.2*  --  19.3* 25.9* 25.5* 25.0*  LYMPHSABS 1.4  --   --   --   --   --   MONOABS 0.3  --   --   --   --   --   EOSABS 0.1  --   --   --   --   --   BASOSABS 0.0  --   --   --   --   --       Chemistries   Recent Labs Lab 09/11/16 2234  09/12/16 0550 09/13/16 0426 09/14/16 0414 09/15/16 1014 09/16/16 1051  NA 133*  < > 135 136 134* 134* 134*  K 4.9  < > 4.2 4.1 4.0 4.0 3.8  CL 108  < > 109 108 106 108 109  CO2 19*  --  18* 21* 20* 20* 19*  GLUCOSE 142*  < > 84 75 78 77 77  BUN 33*  < > 29* 27* 25* 22* 19  CREATININE 1.65*  < > 1.65* 1.53* 1.40* 1.30* 1.19*  CALCIUM 8.1*  --  8.3* 8.4* 8.3* 8.2* 8.4*  AST 27  --   --   --   --   --   --   ALT 8*  --   --   --   --   --   --   ALKPHOS 73  --   --   --   --   --   --   BILITOT 1.0  --   --   --   --   --   --   < > = values in this interval not displayed. ------------------------------------------------------------------------------------------------------------------ No results for input(s): CHOL, HDL, LDLCALC, TRIG, CHOLHDL, LDLDIRECT in the last 72 hours.  Lab Results  Component Value Date   HGBA1C 5.0 05/03/2016   ------------------------------------------------------------------------------------------------------------------ No results for input(s): TSH, T4TOTAL, T3FREE, THYROIDAB in the last 72 hours.  Invalid input(s): FREET3 ------------------------------------------------------------------------------------------------------------------ No results for input(s): VITAMINB12, FOLATE, FERRITIN, TIBC, IRON, RETICCTPCT in the last 72 hours.  Coagulation profile  Recent Labs Lab 09/11/16 2234  INR 2.39    No results for input(s): DDIMER in the last 72 hours.  Cardiac Enzymes No results for input(s): CKMB, TROPONINI, MYOGLOBIN in the last 168 hours.  Invalid input(s): CK ------------------------------------------------------------------------------------------------------------------    Component Value Date/Time   BNP 500.3 (H) 08/02/2016 2352    Inpatient Medications  Scheduled Meds: . apixaban  2.5 mg Oral BID  . atorvastatin  10 mg Oral q1800  . budesonide  1 mg Nebulization BID  .  cholecalciferol  1,000 Units Oral Daily  . collagenase   Topical Daily  . feeding supplement (ENSURE ENLIVE)  237 mL Oral BID BM  . ferrous sulfate  325 mg Oral TID WC  . linezolid  600 mg Oral Q12H  . metoprolol succinate  50 mg Oral Daily  . [START ON 09/17/2016] multivitamin with minerals  1 tablet Oral Daily  . nutrition supplement (JUVEN)  1 packet Oral BID WC  . oxybutynin  5 mg Oral BID  . pantoprazole  40 mg Oral Daily  Continuous Infusions: . dextrose 5 % and 0.45% NaCl 75 mL/hr at 09/15/16 1947   PRN Meds:.acetaminophen, diphenhydrAMINE, ipratropium-albuterol, magic mouthwash, polyethylene glycol  Micro Results Recent Results (from the past 240 hour(s))  Urine culture     Status: Abnormal   Collection Time: 09/11/16 11:05 PM  Result Value Ref Range Status   Specimen Description URINE, RANDOM  Final   Special Requests NONE  Final   Culture (A)  Final    >=100,000 COLONIES/mL VANCOMYCIN RESISTANT ENTEROCOCCUS   Report Status 09/14/2016 FINAL  Final   Organism ID, Bacteria VANCOMYCIN RESISTANT ENTEROCOCCUS (A)  Final      Susceptibility   Vancomycin resistant enterococcus - MIC*    AMPICILLIN >=32 RESISTANT Resistant     LEVOFLOXACIN >=8 RESISTANT Resistant     NITROFURANTOIN 256 RESISTANT Resistant     VANCOMYCIN >=32 RESISTANT Resistant     LINEZOLID 2 SENSITIVE Sensitive     * >=100,000 COLONIES/mL VANCOMYCIN RESISTANT ENTEROCOCCUS  Blood culture (routine x 2)     Status: Abnormal   Collection Time: 09/12/16  1:05 AM  Result Value Ref Range Status   Specimen Description BLOOD LEFT ARM  Final   Special Requests BOTTLES DRAWN AEROBIC AND ANAEROBIC  Final   Culture  Setup Time   Final    GRAM POSITIVE COCCI IN CHAINS IN PAIRS IN BOTH AEROBIC AND ANAEROBIC BOTTLES CRITICAL RESULT CALLED TO, READ BACK BY AND VERIFIED WITH: CARON AMEND, PHARMD @0144  09/13/16 MKELLY,MLT    Culture ENTEROCOCCUS FAECALIS (A)  Final   Report Status 09/15/2016 FINAL  Final   Organism  ID, Bacteria ENTEROCOCCUS FAECALIS  Final      Susceptibility   Enterococcus faecalis - MIC*    AMPICILLIN <=2 SENSITIVE Sensitive     VANCOMYCIN 1 SENSITIVE Sensitive     GENTAMICIN SYNERGY RESISTANT Resistant     * ENTEROCOCCUS FAECALIS  Blood Culture ID Panel (Reflexed)     Status: None   Collection Time: 09/12/16  1:05 AM  Result Value Ref Range Status   Enterococcus species NOT DETECTED NOT DETECTED Final   Vancomycin resistance NOT DETECTED NOT DETECTED Final   Listeria monocytogenes NOT DETECTED NOT DETECTED Final   Staphylococcus species NOT DETECTED NOT DETECTED Final   Staphylococcus aureus NOT DETECTED NOT DETECTED Final   Streptococcus species NOT DETECTED NOT DETECTED Final   Streptococcus agalactiae NOT DETECTED NOT DETECTED Final   Streptococcus pneumoniae NOT DETECTED NOT DETECTED Final   Streptococcus pyogenes NOT DETECTED NOT DETECTED Final   Acinetobacter baumannii NOT DETECTED NOT DETECTED Final   Enterobacteriaceae species NOT DETECTED NOT DETECTED Final   Enterobacter cloacae complex NOT DETECTED NOT DETECTED Final   Escherichia coli NOT DETECTED NOT DETECTED Final   Klebsiella oxytoca NOT DETECTED NOT DETECTED Final   Klebsiella pneumoniae NOT DETECTED NOT DETECTED Final   Proteus species NOT DETECTED NOT DETECTED Final   Serratia marcescens NOT DETECTED NOT DETECTED Final   Haemophilus influenzae NOT DETECTED NOT DETECTED Final   Neisseria meningitidis NOT DETECTED NOT DETECTED Final   Pseudomonas aeruginosa NOT DETECTED NOT DETECTED Final   Candida albicans NOT DETECTED NOT DETECTED Final   Candida glabrata NOT DETECTED NOT DETECTED Final   Candida krusei NOT DETECTED NOT DETECTED Final   Candida parapsilosis NOT DETECTED NOT DETECTED Final   Candida tropicalis NOT DETECTED NOT DETECTED Final  Blood culture (routine x 2)     Status: None (Preliminary result)   Collection Time: 09/12/16  5:55 AM  Result Value  Ref Range Status   Specimen Description BLOOD  RIGHT ARM  Final   Special Requests IN PEDIATRIC BOTTLE  2CC  Final   Culture NO GROWTH 3 DAYS  Final   Report Status PENDING  Incomplete    Radiology Reports Ct Head Wo Contrast  Result Date: 09/16/2016 CLINICAL DATA:  Altered mental status.  Stroke-like symptoms. EXAM: CT HEAD WITHOUT CONTRAST TECHNIQUE: Contiguous axial images were obtained from the base of the skull through the vertex without intravenous contrast. COMPARISON:  09/11/2016. FINDINGS: Brain: Diffusely enlarged ventricles and subarachnoid spaces. Patchy white matter low density in both cerebral hemispheres. No intracranial hemorrhage, mass lesion or CT evidence of acute infarction. Vascular: No hyperdense vessel or unexpected calcification. Skull: Normal. Negative for fracture or focal lesion. Sinuses/Orbits: No acute finding. Other: None. IMPRESSION: No acute abnormality. Stable atrophy and chronic small vessel white matter ischemic changes. Electronically Signed   By: Beckie Salts M.D.   On: 09/16/2016 12:21   US Renal  Result Date: 09/12/2016 CLINICAL DATA:  Hematuria EXAM: RENAL / URINARY TRACT ULTRASOUND COMPLETE COMPARISON:  08/04/2016 CT FINDINGS: Right Kidney: Length: 11.2 cm. There is a simple appearing cyst in the lateral aspect of the mid to upper pole measuring 3.7 x 4.6 x 3.7 cm. An additional 2.7 x 2.1 x 2.2 cm upper pole cortical based cyst is also noted. Mild increase in echogenicity of the renal parenchyma. No obstructive uropathy or nephrolithiasis. Left Kidney: Length: 10.2 cm. Echogenicity is increased. No nephrolithiasis. There is an caliectasis of the left renal collecting system with what appears to be extent in the region of the renal pelvis. The degree of dilatation has decreased since prior CT. Bladder: Decompressed by Foley catheter. IMPRESSION: 1. Simple appearing right renal cyst. 2. Left-sided caliectasis with interval decompression of dilated left renal collecting system presumably by a stent which is  partially visualized on these images. 3. Foley decompressed bladder. 4. Mild echogenicity of both kidneys may reflect medical renal disease. Electronically Signed   By: Tollie Eth M.D.   On: 09/12/2016 03:03   Dg Chest Portable 1 View  Result Date: 09/11/2016 CLINICAL DATA:  Mental status change EXAM: PORTABLE CHEST 1 VIEW COMPARISON:  08/02/2016 FINDINGS: Shallow inspiration. Stable curvilinear scarring. No confluent consolidation. No large effusion. Unchanged cardiomegaly. Normal pulmonary vasculature. IMPRESSION: Stable curvilinear scarring. Unchanged cardiomegaly. No confluent consolidation or large effusion. Electronically Signed   By: Ellery Plunk M.D.   On: 09/11/2016 22:59   Xr Femur Min 2 Views Left  Result Date: 08/28/2016 X-ray femur show satisfactory alignment. Hardware intact after ORIF.  Xr Tibia/fibula Left  Result Date: 08/28/2016 X-ray tib-fib shows good bony anatomy. No acute change. No signs of fracture.  Ct Head Code Stroke W/o Cm  Result Date: 09/11/2016 CLINICAL DATA:  Code stroke. 76 y/o F; left-sided droop and weakness. EXAM: CT HEAD WITHOUT CONTRAST TECHNIQUE: Contiguous axial images were obtained from the base of the skull through the vertex without intravenous contrast. COMPARISON:  CT head 10/15/2012. FINDINGS: Brain: No evidence of acute infarction, hemorrhage, hydrocephalus, extra-axial collection or mass lesion/mass effect. Stable foci of hypoattenuation in subcortical and periventricular white matter compatible with microvascular ischemic changes. Stable lucencies in right caudate body, left caudate head, left lentiform nucleus, and left thalamus compatible with chronic lacunar infarcts. Vascular: No hyperdense vessel identified. Extensive calcific atherosclerosis of cavernous and paraclinoid internal carotid arteries. Skull: Normal. Negative for fracture or focal lesion. Sinuses/Orbits: No acute finding. Other: None. ASPECTS [facility] Stroke Program Early CT Score)  -  Ganglionic level infarction (caudate, lentiform nuclei, internal capsule, insula, M1-M3 cortex): 7 - Supraganglionic infarction (M4-M6 cortex): 3 Total score (0-10 with 10 being normal): 10 IMPRESSION: 1. No acute intracranial abnormality identified. 2. ASPECTS is 10 3. Stable chronic microvascular ischemic changes, parenchymal volume loss, and basal ganglia chronic lacunar infarcts. These results were called by telephone at the time of interpretation on 09/11/2016 at 10:35 pm to Dr. Charm BargesButler, who verbally acknowledged these results. Electronically Signed   By: Mitzi HansenLance  Furusawa-Stratton M.D.   On: 09/11/2016 22:35    Time Spent in minutes  35   Eddie NorthHUNGEL, Henleigh Robello M.D on 09/16/2016 at 1:48 PM  Between 7am to 7pm - Pager - [phone]  After 7pm go to [email or web address] - password [email or web address]  [facility] -  Office  [phone]

## 2016-09-16 NOTE — Procedures (Signed)
ELECTROENCEPHALOGRAM REPORT  Date of Study: 09/16/2016  Patient's Name: Lisa [name] MRN: [id number] Date of Birth: Feb 23, 1941  Referring Provider: Dr. Theda BelfastNishant Dhungel  Clinical History: This is a 76 year old woman with altered mental status.  Medications: acetaminophen (TYLENOL) tablet 650 mg  apixaban (ELIQUIS) tablet 2.5 mg  atorvastatin (LIPITOR) tablet 10 mg  budesonide (PULMICORT) nebulizer solution 1 mg  cholecalciferol (VITAMIN D) tablet 1,000 Units  collagenase (SANTYL) ointment  dextrose 5 %-0.45 % sodium chloride infusion  diphenhydrAMINE (BENADRYL) capsule 25 mg  feeding supplement (ENSURE ENLIVE) (ENSURE ENLIVE) liquid 237 mL  ferrous sulfate tablet 325 mg  ipratropium-albuterol (DUONEB) 0.5-2.5 (3) MG/3ML nebulizer solution 3 mL  linezolid (ZYVOX) tablet 600 mg  magic mouthwash  metoprolol succinate (TOPROL-XL) 24 hr tablet 50 mg  multivitamin with minerals tablet 1 tablet  nutrition supplement (JUVEN) (JUVEN) powder packet 1 packet  oxybutynin (DITROPAN) tablet 5 mg  pantoprazole (PROTONIX) EC tablet 40 mg  polyethylene glycol (MIRALAX / GLYCOLAX) packet 17 g   Technical Summary: A multichannel digital EEG recording measured by the international 10-20 system with electrodes applied with paste and impedances below 5000 ohms performed as portable with EKG monitoring in an awake and confused patient.  Hyperventilation and photic stimulation were not performed.  The digital EEG was referentially recorded, reformatted, and digitally filtered in a variety of bipolar and referential montages for optimal display.   Description: The patient is awake and confused during the recording.  During maximal wakefulness, there is a symmetric, medium voltage 6 Hz posterior dominant rhythm that attenuates with eye opening. This is admixed with a moderate amount of diffuse 4-5 Hz theta and 2-3 Hz delta slowing of the waking background. Normal sleep architecture was not seen.  Hyperventilation and photic stimulation were not performed. Patient is noted to be tremulous with body jerks and chewing movements with no epileptiform correlate seen.  There were no epileptiform discharges or electrographic seizures seen.    Technical difficulties with EKG lead.  Impression: This awake EEG is abnormal due to moderate diffuse slowing of the waking background.  Clinical Correlation of the above findings indicates diffuse cerebral dysfunction that is non-specific in etiology and can be seen with hypoxic/ischemic injury, toxic/metabolic encephalopathies, neurodegenerative disorders, or medication effect. Body jerks, tremulousness, and chewing movements did not show epileptiform correlate. There were no electrographic seizures in this study. Clinical correlation is advised.   Patrcia DollyKaren Helene Bernstein, M.D.

## 2016-09-17 ENCOUNTER — Ambulatory Visit: Payer: Medicare Other | Admitting: Cardiology

## 2016-09-17 DIAGNOSIS — Z1621 Resistance to vancomycin: Secondary | ICD-10-CM

## 2016-09-17 DIAGNOSIS — A491 Streptococcal infection, unspecified site: Secondary | ICD-10-CM

## 2016-09-17 DIAGNOSIS — I10 Essential (primary) hypertension: Secondary | ICD-10-CM

## 2016-09-17 DIAGNOSIS — D508 Other iron deficiency anemias: Secondary | ICD-10-CM

## 2016-09-17 DIAGNOSIS — B952 Enterococcus as the cause of diseases classified elsewhere: Secondary | ICD-10-CM

## 2016-09-17 LAB — CBC
HCT: 28.9 % — ABNORMAL LOW (ref 36.0–46.0)
HEMOGLOBIN: 9.1 g/dL — AB (ref 12.0–15.0)
MCH: 29 pg (ref 26.0–34.0)
MCHC: 31.5 g/dL (ref 30.0–36.0)
MCV: 92 fL (ref 78.0–100.0)
Platelets: 145 10*3/uL — ABNORMAL LOW (ref 150–400)
RBC: 3.14 MIL/uL — ABNORMAL LOW (ref 3.87–5.11)
RDW: 24.9 % — ABNORMAL HIGH (ref 11.5–15.5)
WBC: 8.7 10*3/uL (ref 4.0–10.5)

## 2016-09-17 LAB — GLUCOSE, CAPILLARY
GLUCOSE-CAPILLARY: 73 mg/dL (ref 65–99)
Glucose-Capillary: 66 mg/dL (ref 65–99)
Glucose-Capillary: 71 mg/dL (ref 65–99)
Glucose-Capillary: 81 mg/dL (ref 65–99)
Glucose-Capillary: 82 mg/dL (ref 65–99)
Glucose-Capillary: 83 mg/dL (ref 65–99)
Glucose-Capillary: 85 mg/dL (ref 65–99)

## 2016-09-17 LAB — CULTURE, BLOOD (ROUTINE X 2): CULTURE: NO GROWTH

## 2016-09-17 MED ORDER — DEXTROSE 50 % IV SOLN
INTRAVENOUS | Status: AC
  Administered 2016-09-17: 25 mL
  Filled 2016-09-17: qty 50

## 2016-09-17 NOTE — Progress Notes (Signed)
Hypoglycemic Event  CBG: 66  Treatment: D50 IV 25 mL  Symptoms: None  Follow-up CBG: Time:0128 CBG Result:82  Possible Reasons for Event: Inadequate meal intake  Comments/MD notified:na    Demetra ShinerPeng, Harneet Noblett Ping

## 2016-09-17 NOTE — Progress Notes (Signed)
Nutrition Follow-up  DOCUMENTATION CODES:   Obesity unspecified  INTERVENTION:  Recommend Cortrak NGT placement to provide short term nutrition support. Pt is going on Day 5 of not eating which puts pt at greater risk of worsening skin breakdown and acute malnutrition.  TF recommendations: Initiate Jevity 1.2 @ 20 ml/hr via NGT and increase by 10 ml every 4 hours to goal rate of 55 ml/hr. Provide 30 ml of Pro-stat TID. Tube feeding regimen will provide 1884 kcal, 118 grams of protein, and 1069 ml of water.    NUTRITION DIAGNOSIS:   Increased nutrient needs related to wound healing as evidenced by estimated needs.  ongoing  GOAL:   Patient will meet greater than or equal to 90% of their needs  unmet  MONITOR:   PO intake, Supplement acceptance, Skin, Weight trends, Labs, I & O's  REASON FOR ASSESSMENT:   Consult Poor PO  ASSESSMENT:   76 year old female with history of diabetes mellitus, chronic diastolic CHF, CAD status post CABG, paroxysmal A. fib on eliquis, (s/p cardioversion in 07/2015) OSA on CPAP, hypertension, hyperlipidemia, left femur fracture status post ORIF in 08/05/2016, hydronephrosis requiring cystoscopy with retrograde pyelogram and ureteral stent placement during the same hospitalization. She was readmitted on 2/17 for acute on chronic anemia, negative Hemoccult but with gross hematuria for several weeks.  RN reports that patient is refusing all food and medications today. Pt very lethargic at time of RD visit. Pt has had minimal PO intake since 3/7.   Labs: low hemoglobin, low calcium  Diet Order:  Diet regular Room service appropriate? Yes; Fluid consistency: Thin  Skin:  Wound (see comment) (Stage II PI's on R/L thigh and R/L buttocks)  Last BM:  3/13  Height:   Ht Readings from Last 1 Encounters:  09/12/16 5\' 5"  (1.651 m)    Weight:   Wt Readings from Last 1 Encounters:  09/12/16 229 lb 15 oz (104.3 kg)    Ideal Body Weight:  56.8  kg  BMI:  Body mass index is 38.26 kg/m.  Estimated Nutritional Needs:   Kcal:  1900-2080  Protein:  110-120 grams  Fluid:  2.1 L/day  EDUCATION NEEDS:   No education needs identified at this time  Dorothea Ogleeanne Trenell Moxey RD, LDN, CSP Inpatient Clinical Dietitian Pager: [phone] After Hours Pager: [phone]

## 2016-09-17 NOTE — Progress Notes (Addendum)
Patient ID: Lisa [name], female   DOB: 03/31/1941, 76 y.o.   MRN: [id number]  PROGRESS NOTE    Lisa [name]  WUJ:[id number] DOB: 03/31/1941 DOA: 09/11/2016  PCP: [name],[name], MD   Brief Narrative:  76 year old female with history of diabetes mellitus, chronic diastolic CHF, CAD status post CABG, paroxysmal A. fib on eliquis, (s/p cardioversion in 07/2015) OSA on CPAP, hypertension, hyperlipidemia, left femur fracture status post ORIF in 08/05/2016, hydronephrosis requiring cystoscopy with retrograde pyelogram and ureteral stent placement during the same hospitalization. Pt was admitted 2/17 for acute on chronic anemia and received 1 unit PRBC, hemoccult was negative but she did have gross hematuria for several weeks. Patient was in SNF where she was found to be confused with having stroke-like symptoms. CBG was in 40's and she was given D50 but her altered mental status did not improve.   In the ED she was hemodynamically stable. Blood work was notable for hemoglobin of 7.1 with.  UA again showed frank hematuria. She was found to have VRE on urine culture for which she is on Linezolid. Also, one of the blood cx grew enterococcus faecalis while other blood cx showed no growth.  Assessment & Plan:   Acute metabolic encephalopathy - Prolonged symptoms; unclear etiology although thought to be from hypoglycemia and UTI, bacteremia - CT head with no acute findings - EEG 3/12 showed diffuse cerebral dysfunction that is non-specific in etiology and can be seen with hypoxic/ischemic injury, toxic/metabolic encephalopathies, neurodegenerative disorders, or medication effect.  - Avoid narcotics, benzos - Recent TSH and  b12 normal - Received lactulose for hyperammonemia but her ammonia on 3/12 was WNL  VRE UTI - Continue Linezolid  Enterococcus faecalis bacteremia - Continue linezolid - Only 1 cx with enterococcus faecalis but another blood cx showed no growth   Chronic kidney disease  stage 4 - Baseline Cr in 07/2016 was 2.72 - Cr on this admission within baseline range   Anemia of chronic disease / IDA - Continue ferrous sulfate supplementation   Thrombocytopenia - Monitor daily CBC since pt on apixaban - No gross bleed at this time, no hematuria  Dyslipidemia  - Continue Lipitor   Adult failure to thrive - In the context of acute on chronic illness - Not taking any PO - Palliative for goals of care  - Obtain SLP eval  Essential hypertension - Continue metoprolol daily    DVT prophylaxis: On Apixaban Code Status: full code  Family Communication: no family at the bedside this am Disposition Plan: will get SLP eval, pt still altered, not taking any po and not yet stable for discharge    Consultants:   SLP  Palliative   GU  Neurology  ID  Procedures:   EEG 09/16/2016 - diffuse cerebral dysfunction that is non-specific in etiology and can be seen with hypoxic/ischemic injury, toxic/metabolic encephalopathies, neurodegenerative disorders, or medication effect.   Antimicrobials:   Linezolid -->   Subjective: No overnight events.   Objective: Vitals:   09/17/16 0530 09/17/16 0750 09/17/16 0943 09/17/16 1346  BP: 134/71  107/74 91/63  Pulse: (!) 112  (!) 111 (!) 112  Resp: 20  20 20   Temp: 98.4 F (36.9 C)  98.7 F (37.1 C) 98 F (36.7 C)  TempSrc: Oral  Oral Oral  SpO2: 100% 98% 100% 94%  Weight:      Height:        Intake/Output Summary (Last 24 hours) at 09/17/16 1401 Last data filed at 09/17/16 0830  Gross per 24 hour  Intake             8625 ml  Output              350 ml  Net             8275 ml   Filed Weights   09/12/16 0430  Weight: 104.3 kg (229 lb 15 oz)    Examination:  General exam: Appears restless, disoriented  Respiratory system: Clear to auscultation. Respiratory effort normal. Cardiovascular system: S1 & S2 heard, Rate controlled   Gastrointestinal system: Abdomen is nondistended, soft and nontender.  No organomegaly or masses felt. Normal bowel sounds heard. Central nervous system: mumbles, disoriented  Extremities: Symmetric 5 x 5 power. Skin: warm, dry Psychiatry: not agitated otherwise due to altered emntal status not able to assess adequately   Data Reviewed: I have personally reviewed following labs and imaging studies  CBC:  Recent Labs Lab 09/11/16 2234  09/12/16 0550 09/13/16 0426 09/14/16 0414 09/15/16 1014 09/17/16 0425  WBC 5.4  --  6.7 8.7 8.1 7.7 8.7  NEUTROABS 3.6  --   --   --   --   --   --   HGB 7.1*  < > 7.5* 8.5* 8.1* 8.0* 9.1*  HCT 22.7*  < > 24.0* 27.1* 26.0* 25.3* 28.9*  MCV 99.1  --  99.6 91.6 92.2 92.7 92.0  PLT 148*  --  187 186 178 166 145*  < > = values in this interval not displayed. Basic Metabolic Panel:  Recent Labs Lab 09/12/16 0550 09/13/16 0426 09/14/16 0414 09/15/16 1014 09/16/16 1051  NA 135 136 134* 134* 134*  K 4.2 4.1 4.0 4.0 3.8  CL 109 108 106 108 109  CO2 18* 21* 20* 20* 19*  GLUCOSE 84 75 78 77 77  BUN 29* 27* 25* 22* 19  CREATININE 1.65* 1.53* 1.40* 1.30* 1.19*  CALCIUM 8.3* 8.4* 8.3* 8.2* 8.4*   GFR: Estimated Creatinine Clearance: 47.4 mL/min (by C-G formula based on SCr of 1.19 mg/dL (H)). Liver Function Tests:  Recent Labs Lab 09/11/16 2234  AST 27  ALT 8*  ALKPHOS 73  BILITOT 1.0  PROT 5.7*  ALBUMIN 1.8*   No results for input(s): LIPASE, AMYLASE in the last 168 hours.  Recent Labs Lab 09/15/16 1137 09/16/16 1051  AMMONIA 70* 32   Coagulation Profile:  Recent Labs Lab 09/11/16 2234  INR 2.39   Cardiac Enzymes: No results for input(s): CKTOTAL, CKMB, CKMBINDEX, TROPONINI in the last 168 hours. BNP (last 3 results) No results for input(s): PROBNP in the last 8760 hours. HbA1C: No results for input(s): HGBA1C in the last 72 hours. CBG:  Recent Labs Lab 09/17/16 0038 09/17/16 0128 09/17/16 0401 09/17/16 0733 09/17/16 1144  GLUCAP 66 82 73 71 81   Lipid Profile: No results for  input(s): CHOL, HDL, LDLCALC, TRIG, CHOLHDL, LDLDIRECT in the last 72 hours. Thyroid Function Tests: No results for input(s): TSH, T4TOTAL, FREET4, T3FREE, THYROIDAB in the last 72 hours. Anemia Panel: No results for input(s): VITAMINB12, FOLATE, FERRITIN, TIBC, IRON, RETICCTPCT in the last 72 hours. Urine analysis:    Component Value Date/Time   COLORURINE BROWN (A) 09/11/2016 2305   APPEARANCEUR TURBID (A) 09/11/2016 2305   LABSPEC 1.015 09/11/2016 2305   PHURINE 5.0 09/11/2016 2305   GLUCOSEU 50 (A) 09/11/2016 2305   HGBUR MODERATE (A) 09/11/2016 2305   BILIRUBINUR NEGATIVE 09/11/2016 2305   KETONESUR NEGATIVE 09/11/2016 2305   PROTEINUR  100 (A) 09/11/2016 2305   UROBILINOGEN 1.0 08/20/2014 1130   NITRITE NEGATIVE 09/11/2016 2305   LEUKOCYTESUR SMALL (A) 09/11/2016 2305   Sepsis Labs: @LABRCNTIP (procalcitonin:4,lacticidven:4)  Urine culture     Status: Abnormal   Collection Time: 09/11/16 11:05 PM  Result Value Ref Range Status   Specimen Description URINE, RANDOM  Final   Special Requests NONE  Final   Culture (A)  Final    >=100,000 COLONIES/mL VANCOMYCIN RESISTANT ENTEROCOCCUS   Report Status 09/14/2016 FINAL  Final   Organism ID, Bacteria VANCOMYCIN RESISTANT ENTEROCOCCUS (A)  Final      Susceptibility   Vancomycin resistant enterococcus - MIC*    AMPICILLIN >=32 RESISTANT Resistant     LEVOFLOXACIN >=8 RESISTANT Resistant     NITROFURANTOIN 256 RESISTANT Resistant     VANCOMYCIN >=32 RESISTANT Resistant     LINEZOLID 2 SENSITIVE Sensitive     * >=100,000 COLONIES/mL VANCOMYCIN RESISTANT ENTEROCOCCUS  Blood culture (routine x 2)     Status: Abnormal   Collection Time: 09/12/16  1:05 AM  Result Value Ref Range Status   Specimen Description BLOOD LEFT ARM  Final   Special Requests BOTTLES DRAWN AEROBIC AND ANAEROBIC  Final   Culture  Setup Time   Final   Culture ENTEROCOCCUS FAECALIS (A)  Final   Report Status 09/15/2016 FINAL  Final      Susceptibility    Enterococcus faecalis - MIC*    AMPICILLIN <=2 SENSITIVE Sensitive     VANCOMYCIN 1 SENSITIVE Sensitive     GENTAMICIN SYNERGY RESISTANT Resistant     * ENTEROCOCCUS FAECALIS  Blood Culture ID Panel (Reflexed)     Status: None   Collection Time: 09/12/16  1:05 AM  Result Value Ref Range Status   Enterococcus species NOT DETECTED NOT DETECTED Final   Vancomycin resistance NOT DETECTED NOT DETECTED Final   Listeria monocytogenes NOT DETECTED NOT DETECTED Final   Staphylococcus species NOT DETECTED NOT DETECTED Final   Staphylococcus aureus NOT DETECTED NOT DETECTED Final   Streptococcus species NOT DETECTED NOT DETECTED Final   Streptococcus agalactiae NOT DETECTED NOT DETECTED Final   Streptococcus pneumoniae NOT DETECTED NOT DETECTED Final   Streptococcus pyogenes NOT DETECTED NOT DETECTED Final   Acinetobacter baumannii NOT DETECTED NOT DETECTED Final   Enterobacteriaceae species NOT DETECTED NOT DETECTED Final   Enterobacter cloacae complex NOT DETECTED NOT DETECTED Final   Escherichia coli NOT DETECTED NOT DETECTED Final   Klebsiella oxytoca NOT DETECTED NOT DETECTED Final   Klebsiella pneumoniae NOT DETECTED NOT DETECTED Final   Proteus species NOT DETECTED NOT DETECTED Final   Serratia marcescens NOT DETECTED NOT DETECTED Final   Haemophilus influenzae NOT DETECTED NOT DETECTED Final   Neisseria meningitidis NOT DETECTED NOT DETECTED Final   Pseudomonas aeruginosa NOT DETECTED NOT DETECTED Final   Candida albicans NOT DETECTED NOT DETECTED Final   Candida glabrata NOT DETECTED NOT DETECTED Final   Candida krusei NOT DETECTED NOT DETECTED Final   Candida parapsilosis NOT DETECTED NOT DETECTED Final   Candida tropicalis NOT DETECTED NOT DETECTED Final  Blood culture (routine x 2)     Status: None (Preliminary result)   Collection Time: 09/12/16  5:55 AM  Result Value Ref Range Status   Specimen Description BLOOD RIGHT ARM  Final   Special Requests IN PEDIATRIC BOTTLE  2CC   Final   Culture NO GROWTH 4 DAYS  Final   Report Status PENDING  Incomplete      Radiology Studies: Ct  Head Wo Contrast Result Date: 09/16/2016 No acute abnormality. Stable atrophy and chronic small vessel white matter ischemic changes.    Scheduled Meds: . apixaban  2.5 mg Oral BID  . atorvastatin  10 mg Oral q1800  . budesonide  1 mg Nebulization BID  . cholecalciferol  1,000 Units Oral Daily  . (ENSURE ENLIVE)  237 mL Oral BID BM  . ferrous sulfate  325 mg Oral TID WC  . linezolid  600 mg Oral Q12H  . metoprolol succina  50 mg Oral Daily  . multivitamin with m  1 tablet Oral Daily  . nutrition supplement (JUVE  1 packet Oral BID WC  . oxybutynin  5 mg Oral BID  . pantoprazole  40 mg Oral Daily   Continuous Infusions: . dextrose 5 % and 0.45% NaCl 75 mL/hr at 09/17/16 1309     LOS: 4 days    Time spent: 25 minutes  Greater than 50% of the time spent on counseling and coordinating the care.   Manson Passey, MD [facility] Pager [phone]  If 7PM-7AM, please contact night-coverage [email or web address] Password [email or web address] 09/17/2016, 2:01 PM

## 2016-09-17 NOTE — NC FL2 (Signed)
MEDICAID FL2 LEVEL OF CARE SCREENING TOOL     IDENTIFICATION  Patient Name: Lisa [name] Birthdate: Aug 23, 1940 Sex: female Admission Date (Current Location): 09/11/2016  [facility] and [place] Number:  Producer, television/film/video and Address:  [facility], [address], [place], [place] [address]      Provider Number: [id number]  Attending Physician Name and Address:  Alison Murray, MD  Relative Name and Phone Number:       Current Level of Care: Hospital Recommended Level of Care: Skilled Nursing Facility Prior Approval Number:    Date Approved/Denied:   PASRR Number: [id number]  Discharge Plan: SNF    Current Diagnoses: Patient Active Problem List   Diagnosis Date Noted  . Altered mental status   . Hypoglycemia 09/12/2016  . Acute cystitis with hematuria   . Acute encephalopathy   . Anemia 08/24/2016  . Pressure injury of skin 08/24/2016  . Hypertensive heart disease   . CAD (coronary artery disease) 08/02/2016  . Femur fracture, left (HCC) 08/02/2016  . Fibula fracture 08/02/2016  . Coagulopathy (HCC) 08/02/2016  . DNR (do not resuscitate) discussion   . Chronic anticoagulation 06/07/2016  . Anemia, normocytic normochromic 06/07/2016  . Acute on chronic renal insufficiency 06/07/2016  . Chronic atrial fibrillation (HCC)   . Pressure sore 06/06/2016  . Chronic respiratory failure with hypoxia (HCC) 06/03/2016  . Acute renal failure (ARF) (HCC)   . PVC (premature ventricular contraction) 03/22/2014  . Chronic diastolic heart failure (HCC) 05/06/2013  . OSA (obstructive sleep apnea) 05/06/2013  . Morbid obesity (HCC)   . Hyperlipidemia   . GERD (gastroesophageal reflux disease) 10/27/2010  . Gout 10/27/2010  . Hypertension 10/27/2010  . Asthma 10/27/2010  . Diabetes mellitus (HCC) 10/27/2010    Orientation RESPIRATION BLADDER Height & Weight     Self, Situation, Place  O2 (Nasal Canula 2 L) Continent, Indwelling catheter  Weight: 229 lb 15 oz (104.3 kg) Height:  5\' 5"  (165.1 cm)  BEHAVIORAL SYMPTOMS/MOOD NEUROLOGICAL BOWEL NUTRITION STATUS   (None)  (None) Incontinent Diet (Regular)  AMBULATORY STATUS COMMUNICATION OF NEEDS Skin   Extensive Assist Verbally PU Stage and Appropriate Care, Other (Comment) (Deep tissue injury: Left heel (foam). Non pressure wounds: Lateral posterior left leg (gauze) and posterior medial left leg (hydrogel daily))   PU Stage 2 Dressing:  (Left posterior right thigh: Foam. Left, right buttocks: Foam. Medial buttocks: Foam.)                   Personal Care Assistance Level of Assistance  Bathing, Feeding, Dressing Bathing Assistance: Maximum assistance Feeding assistance: Limited assistance Dressing Assistance: Maximum assistance     Functional Limitations Info  Sight, Hearing, Speech Sight Info: Adequate Hearing Info: Adequate Speech Info: Adequate    SPECIAL CARE FACTORS FREQUENCY  PT (By licensed PT), Blood pressure     PT Frequency: 5 x week              Contractures Contractures Info: Not present    Additional Factors Info  Code Status, Allergies, Isolation Precautions Code Status Info: Full Allergies Info: NKDA     Isolation Precautions Info: Contact: VRE     Current Medications (09/17/2016):  This is the current hospital active medication list Current Facility-Administered Medications  Medication Dose Route Frequency Provider Last Rate Last Dose  . acetaminophen (TYLENOL) tablet 650 mg  650 mg Oral Q4H PRN Hillary Bow, DO   650 mg at 09/15/16 0902  .  apixaban (ELIQUIS) tablet 2.5 mg  2.5 mg Oral BID Nishant Dhungel, MD   2.5 mg at 09/16/16 2326  . atorvastatin (LIPITOR) tablet 10 mg  10 mg Oral q1800 Hillary BowJared M Gardner, DO   10 mg at 09/15/16 1800  . budesonide (PULMICORT) nebulizer solution 1 mg  1 mg Nebulization BID Hillary BowJared M Gardner, DO   1 mg at 09/17/16 0750  . cholecalciferol (VITAMIN D) tablet 1,000 Units  1,000 Units Oral Daily Hillary BowJared M  Gardner, DO   1,000 Units at 09/16/16 1109  . collagenase (SANTYL) ointment   Topical Daily Nishant Dhungel, MD      . dextrose 5 %-0.45 % sodium chloride infusion   Intravenous Continuous Hillary BowJared M Gardner, DO 75 mL/hr at 09/17/16 1309    . diphenhydrAMINE (BENADRYL) capsule 25 mg  25 mg Oral Q6H PRN Hillary BowJared M Gardner, DO   25 mg at 09/14/16 1831  . feeding supplement (ENSURE ENLIVE) (ENSURE ENLIVE) liquid 237 mL  237 mL Oral BID BM Nishant Dhungel, MD      . ferrous sulfate tablet 325 mg  325 mg Oral TID WC Hillary BowJared M Gardner, DO   325 mg at 09/16/16 1109  . ipratropium-albuterol (DUONEB) 0.5-2.5 (3) MG/3ML nebulizer solution 3 mL  3 mL Nebulization Q6H PRN Hillary BowJared M Gardner, DO      . linezolid (ZYVOX) tablet 600 mg  600 mg Oral Q12H Gardiner Barefootobert W Comer, MD   600 mg at 09/16/16 2325  . magic mouthwash  5 mL Oral QID PRN Nishant Dhungel, MD   5 mL at 09/16/16 2326  . metoprolol succinate (TOPROL-XL) 24 hr tablet 50 mg  50 mg Oral Daily Hillary BowJared M Gardner, DO   50 mg at 09/15/16 1132  . multivitamin with minerals tablet 1 tablet  1 tablet Oral Daily Nishant Dhungel, MD      . nutrition supplement (JUVEN) (JUVEN) powder packet 1 packet  1 packet Oral BID WC Nishant Dhungel, MD      . oxybutynin (DITROPAN) tablet 5 mg  5 mg Oral BID Hillary BowJared M Gardner, DO   5 mg at 09/16/16 2325  . pantoprazole (PROTONIX) EC tablet 40 mg  40 mg Oral Daily Hillary BowJared M Gardner, DO   40 mg at 09/16/16 1109  . polyethylene glycol (MIRALAX / GLYCOLAX) packet 17 g  17 g Oral Daily PRN Hillary BowJared M Gardner, DO         Discharge Medications: Please see discharge summary for a list of discharge medications.  Relevant Imaging Results:  Relevant Lab Results:   Additional Information SS#: [id number]. Got a letter from insurance that she has used all of her Medicare days. Would need to come in under Medicaid.  Margarito LinerSarah C Malita Ignasiak, LCSW

## 2016-09-17 NOTE — Progress Notes (Signed)
pPt declined breakfast along with her medication. MD aware of refuasal. Pt is alert, able to tell Rn where she is and why she is here. Pt able to c/o pain when staffs repositioned and provided baths. Pt in no other distress at thsi time. Will continue to monitor.  Donovan Persley,RN

## 2016-09-17 NOTE — Progress Notes (Signed)
Physical Therapy Treatment Patient Details Name: Lisa [name] MRN: [id number] DOB: 03-13-1941 Today's Date: 09/17/2016    History of Present Illness Patient is a 76 y/o female admitted from SNF due to AMS and r/o stroke, found to be anemic with Hgb 7.1 (ongoing hematuria after stent placed for hydronephrosis during admission 1/26-2/3 with L femur fx with ex-fix) and pt found to be hypoglycemic.     PT Comments    Patient progressing with sitting tolerance with encouragement and much assist.  Continues to complain of being sore all over, but does not have visible signs of bruising except L arm with IV placement.  Continue to recommend SNF level rehab, but if daughter plans to take pt home, would need hospital bed, hoyer lift, extra wide w/c and bed pan.  Will continue skilled PT during acute stay.  Follow Up Recommendations  SNF     Equipment Recommendations  None recommended by PT    Recommendations for Other Services       Precautions / Restrictions Precautions Precautions: Fall Restrictions Other Position/Activity Restrictions: admission after femur fx pt in KI and 50% PWB    Mobility  Bed Mobility Overal bed mobility: Needs Assistance Bed Mobility: Supine to Sit;Sit to Supine     Supine to sit: +2 for physical assistance;Total assist Sit to supine: +2 for physical assistance;Max assist   General bed mobility comments: elevated HOB, moved legs off bed and lifted trunk upright; pt pulling up some, but also c/o pain throughout and gives up easily; to supine assist to lower trunk and bring legs into bed, then for positioning near [facility]  Transfers                 General transfer comment: unable at this time  Ambulation/Gait                 Stairs            Wheelchair Mobility    Modified Rankin (Stroke Patients Only)       Balance Overall balance assessment: Needs assistance   Sitting balance-Leahy Scale: Zero Sitting balance -  Comments: patient resting back on therapist assisting behind her, initially requesting to lie back down, then with another PT assist beside her sat for about 8-10 minutes encouraging liquids (pt c/o mouth burns,) and encouraging self care (washed face in supine)                            Cognition Arousal/Alertness: Awake/alert Behavior During Therapy: Anxious Overall Cognitive Status: No family/caregiver present to determine baseline cognitive functioning                 General Comments: oriented to year and place and generally to situation    Exercises      General Comments        Pertinent Vitals/Pain Faces Pain Scale: Hurts whole lot Pain Location: Bilat LE and back Pain Descriptors / Indicators: Grimacing;Guarding;Discomfort;Sore Pain Intervention(s): Monitored during session;Limited activity within patient's tolerance;Repositioned    Home Living                      Prior Function            PT Goals (current goals can now be found in the care plan section) Progress towards PT goals: Progressing toward goals    Frequency    Min 2X/week      PT Plan Current  plan remains appropriate    Co-evaluation             End of Session Equipment Utilized During Treatment: Oxygen Activity Tolerance: Patient tolerated treatment well Patient left: in bed;with call bell/phone within reach;with bed alarm set   PT Visit Diagnosis: Muscle weakness (generalized) (M62.81);History of falling (Z91.81)     Time: 1610-9604 PT Time Calculation (min) (ACUTE ONLY): 26 min  Charges:  $Therapeutic Activity: 23-37 mins                    G Codes:       Elray Mcgregor 11-Oct-2016, 4:14 PM  Sheran Lawless, PT [phone] October 11, 2016

## 2016-09-18 DIAGNOSIS — B952 Enterococcus as the cause of diseases classified elsewhere: Secondary | ICD-10-CM | POA: Diagnosis present

## 2016-09-18 DIAGNOSIS — A498 Other bacterial infections of unspecified site: Secondary | ICD-10-CM | POA: Diagnosis present

## 2016-09-18 DIAGNOSIS — R627 Adult failure to thrive: Secondary | ICD-10-CM | POA: Diagnosis present

## 2016-09-18 DIAGNOSIS — A491 Streptococcal infection, unspecified site: Secondary | ICD-10-CM | POA: Diagnosis present

## 2016-09-18 DIAGNOSIS — E785 Hyperlipidemia, unspecified: Secondary | ICD-10-CM

## 2016-09-18 DIAGNOSIS — D696 Thrombocytopenia, unspecified: Secondary | ICD-10-CM | POA: Diagnosis present

## 2016-09-18 DIAGNOSIS — I10 Essential (primary) hypertension: Secondary | ICD-10-CM | POA: Diagnosis present

## 2016-09-18 DIAGNOSIS — Z1621 Resistance to vancomycin: Secondary | ICD-10-CM

## 2016-09-18 DIAGNOSIS — L8990 Pressure ulcer of unspecified site, unspecified stage: Secondary | ICD-10-CM | POA: Diagnosis present

## 2016-09-18 DIAGNOSIS — N184 Chronic kidney disease, stage 4 (severe): Secondary | ICD-10-CM | POA: Diagnosis present

## 2016-09-18 DIAGNOSIS — D638 Anemia in other chronic diseases classified elsewhere: Secondary | ICD-10-CM | POA: Diagnosis present

## 2016-09-18 LAB — GLUCOSE, CAPILLARY
GLUCOSE-CAPILLARY: 93 mg/dL (ref 65–99)
GLUCOSE-CAPILLARY: 108 mg/dL — AB (ref 65–99)
GLUCOSE-CAPILLARY: 87 mg/dL (ref 65–99)
GLUCOSE-CAPILLARY: 74 mg/dL (ref 65–99)
Glucose-Capillary: 97 mg/dL (ref 65–99)
Glucose-Capillary: 87 mg/dL (ref 65–99)
Glucose-Capillary: 76 mg/dL (ref 65–99)

## 2016-09-18 LAB — BASIC METABOLIC PANEL
Anion gap: 8 (ref 5–15)
BUN: 16 mg/dL (ref 6–20)
CHLORIDE: 110 mmol/L (ref 101–111)
CO2: 16 mmol/L — AB (ref 22–32)
CREATININE: 1.08 mg/dL — AB (ref 0.44–1.00)
Calcium: 8.4 mg/dL — ABNORMAL LOW (ref 8.9–10.3)
GFR calc Af Amer: 56 mL/min — ABNORMAL LOW (ref >60)
GFR calc non Af Amer: 48 mL/min — ABNORMAL LOW (ref >60)
GLUCOSE: 108 mg/dL — AB (ref 65–99)
POTASSIUM: 5 mmol/L (ref 3.5–5.1)
Sodium: 134 mmol/L — ABNORMAL LOW (ref 135–145)

## 2016-09-18 LAB — CBC
HCT: 23.6 % — ABNORMAL LOW (ref 36.0–46.0)
HEMOGLOBIN: 7.7 g/dL — AB (ref 12.0–15.0)
MCH: 30.1 pg (ref 26.0–34.0)
MCHC: 32.6 g/dL (ref 30.0–36.0)
MCV: 92.2 fL (ref 78.0–100.0)
Platelets: 92 10*3/uL — ABNORMAL LOW (ref 150–400)
RBC: 2.56 MIL/uL — AB (ref 3.87–5.11)
RDW: 24.6 % — ABNORMAL HIGH (ref 11.5–15.5)
WBC: 8.6 10*3/uL (ref 4.0–10.5)

## 2016-09-18 NOTE — Progress Notes (Signed)
CM called and spoke to Natalia LeatherwoodKatherine (pts daughter) over the phone. [facility]. Natalia LeatherwoodKatherine was under the impression that HH meant someone would be at the home most of the time to care for her mother. CM explained that [facility] would only be in the home an hour at a time and only a few times a week per each service. CM offered Natalia LeatherwoodKatherine a private duty list but she refused. At this time Natalia LeatherwoodKatherine does not feel she can take her mother home and provide the care she is going to need. Natalia LeatherwoodKatherine expressed interest in having her mother returned to SNF but is concerned about her not having Medicare days left for SNF. Patient also has Medicaid. Natalia LeatherwoodKatherine to discuss placement with the rest of the family and let CM know decision but currently in agreement with [place] her [place].  CM informed CSW.

## 2016-09-18 NOTE — Progress Notes (Signed)
SCDs arrived and applied.   Sim BoastHavy, RN

## 2016-09-18 NOTE — Consult Note (Signed)
   [facility]   09/18/2016  [name] Kallenbach 09/15/1940 [id number]    [facility] Management follow up. Went to bedside to speak with patient and daughter. However, both daughter and patient were resting upon visit. Will continue to follow up.   Raiford NobleAtika Abou Sterkel, MSN-Ed, RN,BSN [facility] Care Management Hospital Liaison [phone]

## 2016-09-18 NOTE — Progress Notes (Addendum)
Patient ID: Lisa [name], female   DOB: 1941/03/20, 76 y.o.   MRN: [id number]  PROGRESS NOTE    Lisa [name]  BMW:[id number] DOB: July 11, 1940 DOA: 09/11/2016  PCP: Laurena Slimmer, MD   Brief Narrative:  76 year old female with history of diabetes mellitus, chronic diastolic CHF, CAD status post CABG, paroxysmal A. fib on eliquis, (s/p cardioversion in 07/2015) OSA on CPAP, hypertension, hyperlipidemia, left femur fracture status post ORIF in 08/05/2016, hydronephrosis requiring cystoscopy with retrograde pyelogram and ureteral stent placement during the same hospitalization. Pt was admitted 2/17 for acute on chronic anemia and received 1 unit PRBC, hemoccult was negative but she did have gross hematuria for several weeks. Patient was in SNF where she was found to be confused with having stroke-like symptoms. CBG was in 40's and she was given D50 but her altered mental status did not improve.   In the ED she was hemodynamically stable. Blood work was notable for hemoglobin of 7.1 with.  UA again showed frank hematuria. She was found to have VRE on urine culture for which she is on Linezolid. Also, one of the blood cx grew enterococcus faecalis while other blood cx showed no growth.  Assessment & Plan:   Acute metabolic encephalopathy - Prolonged symptoms; unclear etiology although thought to be from hypoglycemia and UTI, bacteremia - Pt still altered - CT head with no acute findings - EEG 3/12 showed diffuse cerebral dysfunction that is non-specific in etiology and can be seen with hypoxic/ischemic injury, toxic/metabolic encephalopathies, neurodegenerative disorders, or medication effect.  - Avoid narcotics, benzos - Recent TSH and  b12 normal - Received lactulose for hyperammonemia but her ammonia on 3/12 was WNL  VRE UTI - Continue Linezolid  Enterococcus faecalis bacteremia - Continue linezolid - Only 1 cx with enterococcus faecalis but another blood cx showed no growth    Chronic atrial fibrillation - CHADS vasc score 4 - Hold apixaban due to drop in hgb and platelet count - Continue metoprolol for rate control   Chronic kidney disease stage 4 - Baseline Cr in 07/2016 was 2.72 - Cr on this admission within baseline range  - Cr 1.08 this am  Anemia of chronic disease / IDA - Continue ferrous sulfate supplementation  - Hemoglobin down to 7.7, from 9.1 yesterday - Check FOBT  Thrombocytopenia - Drop in platelets from 145 to 92 this am - Stop apixaban and use SCD's for DVT prophylaxis  - Check CBC daily   Dyslipidemia  - Continue Lipitor   Adult failure to thrive / Severe protein calorie malnutrition  - In the context of acute on chronic illness - Not taking any PO - Seen by SLP - diet regular   Essential hypertension - Continue metoprolol daily   Multiple skin issues, deep tissue injury to left heel, left calf ulceration  - Deep tissue injury: left heel; 3cm x 3cm x 0cm - Full thickness lateral left calf:  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 Change daily. Hydrogel for moist wound healing to the left medial calf wound, cover with dry dressing. Change daily. Silicone foam heel dressing to the left heel, continue use of heel protector from facility. Low air loss mattress for pressure redistribution.   DVT prophylaxis: On Apixaban but will hold it  due to drop in hemoglobin and platelet count; use SCD's  Code Status: full code  Family Communication: no family at the bedside this am Disposition Plan: awaiting palliative care team to have GOC discussion with family    Consultants:   SLP  WOC  Nutrition   PT  Palliative   GU  Neurology  ID  Procedures:   EEG 09/16/2016 - diffuse  cerebral dysfunction that is non-specific in etiology and can be seen with hypoxic/ischemic injury, toxic/metabolic encephalopathies, neurodegenerative disorders, or medication effect.   Antimicrobials:   Linezolid -->   Subjective: No overnight events.   Objective: Vitals:   09/18/16 0116 09/18/16 0519 09/18/16 0838 09/18/16 0935  BP: (!) 141/71 111/76  (!) 109/50  Pulse: (!) 107 (!) 105  74  Resp: 20 20  20   Temp: 97.8 F (36.6 C) 97.7 F (36.5 C)  97.8 F (36.6 C)  TempSrc: Axillary Oral  Oral  SpO2: 100% 98% 93%   Weight:      Height:        Intake/Output Summary (Last 24 hours) at 09/18/16 1039 Last data filed at 09/18/16 1000  Gross per 24 hour  Intake             2475 ml  Output              400 ml  Net             2075 ml   Filed Weights   09/12/16 0430  Weight: 104.3 kg (229 lb 15 oz)    Examination:  General exam: no acute distress  Respiratory system: No wheezing, no rhonchi  Cardiovascular system: S1 & S2 heard, RRR Gastrointestinal system: (+ BS, non tender abdomen. Central nervous system: not oriented, confused Extremities: No tenderness, palpable pulses  Skin: she has deep tissue injury to left heel, ulcer on lateral and medical left calf Psychiatry: not agitated, not restless   Data Reviewed: I have personally reviewed following labs and imaging studies  CBC:  Recent Labs Lab 09/11/16 2234  09/13/16 0426 09/14/16 0414 09/15/16 1014 09/17/16 0425 09/18/16 0614  WBC 5.4  < > 8.7 8.1 7.7 8.7 8.6  NEUTROABS 3.6  --   --   --   --   --   --   HGB 7.1*  < > 8.5* 8.1* 8.0* 9.1* 7.7*  HCT 22.7*  < > 27.1* 26.0* 25.3* 28.9* 23.6*  MCV 99.1  < > 91.6 92.2 92.7 92.0 92.2  PLT 148*  < > 186 178 166 145* 92*  < > = values in this interval not displayed. Basic Metabolic Panel:  Recent Labs Lab 09/13/16 0426 09/14/16 0414 09/15/16 1014 09/16/16 1051 09/18/16 0614  NA 136 134* 134* 134* 134*  K 4.1 4.0 4.0 3.8 5.0  CL 108 106 108 109 110    CO2 21* 20* 20* 19* 16*  GLUCOSE 75 78 77 77 108*  BUN 27* 25* 22* 19 16  CREATININE 1.53* 1.40* 1.30* 1.19* 1.08*  CALCIUM 8.4* 8.3* 8.2* 8.4* 8.4*   GFR: Estimated Creatinine Clearance: 52.3 mL/min (by C-G formula based on SCr of 1.08 mg/dL (H)). Liver Function Tests:  Recent Labs Lab 09/11/16 2234  AST 27  ALT 8*  ALKPHOS 73  BILITOT 1.0  PROT 5.7*  ALBUMIN 1.8*   No results for input(s): LIPASE, AMYLASE in the last 168 hours.  Recent Labs Lab 09/15/16 1137 09/16/16 1051  AMMONIA 70* 32   Coagulation Profile:  Recent Labs  Lab 09/11/16 2234  INR 2.39   Cardiac Enzymes: No results for input(s): CKTOTAL, CKMB, CKMBINDEX, TROPONINI in the last 168 hours. BNP (last 3 results) No results for input(s): PROBNP in the last 8760 hours. HbA1C: No results for input(s): HGBA1C in the last 72 hours. CBG:  Recent Labs Lab 09/17/16 1652 09/17/16 2014 09/18/16 0115 09/18/16 0517 09/18/16 0744  GLUCAP 83 85 93 108* 97   Lipid Profile: No results for input(s): CHOL, HDL, LDLCALC, TRIG, CHOLHDL, LDLDIRECT in the last 72 hours. Thyroid Function Tests: No results for input(s): TSH, T4TOTAL, FREET4, T3FREE, THYROIDAB in the last 72 hours. Anemia Panel: No results for input(s): VITAMINB12, FOLATE, FERRITIN, TIBC, IRON, RETICCTPCT in the last 72 hours. Urine analysis:    Component Value Date/Time   COLORURINE BROWN (A) 09/11/2016 2305   APPEARANCEUR TURBID (A) 09/11/2016 2305   LABSPEC 1.015 09/11/2016 2305   PHURINE 5.0 09/11/2016 2305   GLUCOSEU 50 (A) 09/11/2016 2305   HGBUR MODERATE (A) 09/11/2016 2305   BILIRUBINUR NEGATIVE 09/11/2016 2305   KETONESUR NEGATIVE 09/11/2016 2305   PROTEINUR 100 (A) 09/11/2016 2305   UROBILINOGEN 1.0 08/20/2014 1130   NITRITE NEGATIVE 09/11/2016 2305   LEUKOCYTESUR SMALL (A) 09/11/2016 2305   Sepsis Labs: @LABRCNTIP (procalcitonin:4,lacticidven:4)  Urine culture     Status: Abnormal   Collection Time: 09/11/16 11:05 PM   Result Value Ref Range Status   Specimen Description URINE, RANDOM  Final   Special Requests NONE  Final   Culture (A)  Final    >=100,000 COLONIES/mL VANCOMYCIN RESISTANT ENTEROCOCCUS   Report Status 09/14/2016 FINAL  Final   Organism ID, Bacteria VANCOMYCIN RESISTANT ENTEROCOCCUS (A)  Final      Susceptibility   Vancomycin resistant enterococcus - MIC*    AMPICILLIN >=32 RESISTANT Resistant     LEVOFLOXACIN >=8 RESISTANT Resistant     NITROFURANTOIN 256 RESISTANT Resistant     VANCOMYCIN >=32 RESISTANT Resistant     LINEZOLID 2 SENSITIVE Sensitive     * >=100,000 COLONIES/mL VANCOMYCIN RESISTANT ENTEROCOCCUS  Blood culture (routine x 2)     Status: Abnormal   Collection Time: 09/12/16  1:05 AM  Result Value Ref Range Status   Specimen Description BLOOD LEFT ARM  Final   Special Requests BOTTLES DRAWN AEROBIC AND ANAEROBIC 5ML  Final   Culture  Setup Time   Final   Culture ENTEROCOCCUS FAECALIS (A)  Final   Report Status 09/15/2016 FINAL  Final      Susceptibility   Enterococcus faecalis - MIC*    AMPICILLIN <=2 SENSITIVE Sensitive     VANCOMYCIN 1 SENSITIVE Sensitive     GENTAMICIN SYNERGY RESISTANT Resistant     * ENTEROCOCCUS FAECALIS  Blood Culture ID Panel (Reflexed)     Status: None   Collection Time: 09/12/16  1:05 AM  Result Value Ref Range Status   Enterococcus species NOT DETECTED NOT DETECTED Final   Vancomycin resistance NOT DETECTED NOT DETECTED Final   Listeria monocytogenes NOT DETECTED NOT DETECTED Final   Staphylococcus species NOT DETECTED NOT DETECTED Final   Staphylococcus aureus NOT DETECTED NOT DETECTED Final   Streptococcus species NOT DETECTED NOT DETECTED Final   Streptococcus agalactiae NOT DETECTED NOT DETECTED Final   Streptococcus pneumoniae NOT DETECTED NOT DETECTED Final   Streptococcus pyogenes NOT DETECTED NOT DETECTED Final   Acinetobacter baumannii NOT DETECTED NOT DETECTED Final   Enterobacteriaceae species NOT DETECTED NOT DETECTED  Final   Enterobacter cloacae complex NOT DETECTED NOT DETECTED Final   Escherichia  coli NOT DETECTED NOT DETECTED Final   Klebsiella oxytoca NOT DETECTED NOT DETECTED Final   Klebsiella pneumoniae NOT DETECTED NOT DETECTED Final   Proteus species NOT DETECTED NOT DETECTED Final   Serratia marcescens NOT DETECTED NOT DETECTED Final   Haemophilus influenzae NOT DETECTED NOT DETECTED Final   Neisseria meningitidis NOT DETECTED NOT DETECTED Final   Pseudomonas aeruginosa NOT DETECTED NOT DETECTED Final   Candida albicans NOT DETECTED NOT DETECTED Final   Candida glabrata NOT DETECTED NOT DETECTED Final   Candida krusei NOT DETECTED NOT DETECTED Final   Candida parapsilosis NOT DETECTED NOT DETECTED Final   Candida tropicalis NOT DETECTED NOT DETECTED Final  Blood culture (routine x 2)     Status: None (Preliminary result)   Collection Time: 09/12/16  5:55 AM  Result Value Ref Range Status   Specimen Description BLOOD RIGHT ARM  Final   Special Requests IN PEDIATRIC BOTTLE  2CC  Final   Culture NO GROWTH 4 DAYS  Final   Report Status PENDING  Incomplete      Radiology Studies: Ct Head Wo Contrast Result Date: 09/16/2016 No acute abnormality. Stable atrophy and chronic small vessel white matter ischemic changes.    Marland Kitchen atorvastatin  10 mg Oral q1800  . budesonide  1 mg Nebulization BID  . cholecalciferol  1,000 Units Oral Daily  . collagenase   Topical Daily  . feeding supplement (ENSURE ENLIVE)  237 mL Oral BID BM  . ferrous sulfate  325 mg Oral TID WC  . linezolid  600 mg Oral Q12H  . metoprolol succinate  50 mg Oral Daily  . multivitamin with minerals  1 tablet Oral Daily  . nutrition supplement (JUVEN)  1 packet Oral BID WC  . oxybutynin  5 mg Oral BID  . pantoprazole  40 mg Oral Daily    Continuous Infusions: . dextrose 5 % and 0.45% NaCl 1,000 mL (09/18/16 0238)     LOS: 5 days    Time spent: 25 minutes  Greater than 50% of the time spent on counseling and  coordinating the care.   Manson Passey, MD [facility] Pager [phone]  If 7PM-7AM, please contact night-coverage [email or web address] Password [facility] 09/18/2016, 10:39 AM

## 2016-09-18 NOTE — Evaluation (Signed)
Clinical/Bedside Swallow Evaluation Patient Details  Name: Lisa [name] MRN: [id number] Date of Birth: April 21, 1941  Today's Date: 09/18/2016 Time: SLP Start Time (ACUTE ONLY): 0745 SLP Stop Time (ACUTE ONLY): 0800 SLP Time Calculation (min) (ACUTE ONLY): 15 min  Past Medical History:  Past Medical History:  Diagnosis Date  . Chronic diastolic CHF (congestive heart failure) (HCC)   . Coronary artery disease    s/p CABG  . Diabetes mellitus without complication (HCC)   . Hyperlipidemia   . Hypertension   . Morbid obesity (HCC)   . OSA (obstructive sleep apnea)    mild OSA with AHI 7.15 now on CPAP at 16cm H2O  . RBBB    Past Surgical History:  Past Surgical History:  Procedure Laterality Date  . BACK SURGERY    . CARDIOVERSION N/A 08/03/2015   Procedure: CARDIOVERSION;  Surgeon: Vesta MixerPhilip J [name], MD;  [facility];  Service: Cardiovascular;  Laterality: N/A;  . CORONARY ANGIOPLASTY WITH STENT PLACEMENT    . CORONARY ARTERY BYPASS GRAFT    . CYSTOSCOPY W/ URETERAL STENT PLACEMENT Left 08/05/2016   Procedure: CYSTOSCOPY WITH RETROGRADE PYELOGRAM/URETERAL STENT PLACEMENT;  Surgeon: Marcine MatarStephen Dahlstedt, MD;  [facility];  Service: Urology;  Laterality: Left;  . EXTERNAL FIXATION LEG Left 08/05/2016   Procedure: EXTERNAL FIXATION FEMUR, ORIF FEMUR;  Surgeon: Eldred MangesMark C Yates, MD;  Location: [facility] OR;  Service: Orthopedics;  Laterality: Left;  . KNEE SURGERY Bilateral   . ORIF ANKLE FRACTURE Right 08/19/2014   Procedure: OPEN REDUCTION INTERNAL FIXATION (ORIF) ANKLE FRACTURE;  Surgeon: Cammy CopaGregory Scott Dean, MD;  [facility];  Service: Orthopedics;  Laterality: Right;  . rotator cuff surgery     HPI:  76 y/o female admitted from SNF due to AMS and r/o stroke, found to be anemic with Hgb 7.1 (ongoing hematuria after stent placed for hydronephrosis during admission 1/26-2/3 with L femur fx with ex-fix) and pt found to be hypoglycemic.   Swallow evaluation ordered.  Pt reports  sensation of food "balling up" in throat for two weeks.  CXR showed effusion, CT head negative for acute change.  Pt with poor intake.     Assessment / Plan / Recommendation Clinical Impression  Pt presents with minimal oral deficits due to generalized weakness and poor dentition.  No indication of airway compromise with po intake nor oral residuals post=swallow.  Pt willing only to accept HOB elevation to approximately 57* and consumed only few boluses of pancakes, orange juice and water.  Swallow with liquids via straw was swift with clear voice.  Pt did complain of "burning" with orange juice - ? if could be coorelated to reflux issues.  Recommend continue regular diet to allow pt to choose items she can eat to maximize intake.  Advised pt to precautions, need to sit upright for po.  No SLP follow up indicated.  SLP Visit Diagnosis: Dysphagia, oral phase (R13.11)    Aspiration Risk  Mild aspiration risk    Diet Recommendation Regular;Thin liquid   Liquid Administration via: Straw;Cup Medication Administration: Whole meds with liquid Supervision: Patient able to self feed Compensations: Slow rate;Small sips/bites    Other  Recommendations Oral Care Recommendations: Oral care BID   Follow up Recommendations None      Frequency and Duration            Prognosis        Swallow Study   General Date of Onset: 09/18/16 HPI: 76 y/o female admitted from SNF due to  AMS and r/o stroke, found to be anemic with Hgb 7.1 (ongoing hematuria after stent placed for hydronephrosis during admission 1/26-2/3 with L femur fx with ex-fix) and pt found to be hypoglycemic.   Swallow evaluation ordered.  Pt reports sensation of food "balling up" in throat for two weeks.  CXR showed effusion, CT head negative for acute change.  Pt with poor intake.   Type of Study: Bedside Swallow Evaluation Diet Prior to this Study: Regular;Thin liquids Temperature Spikes Noted: No Respiratory Status: Room air History  of Recent Intubation: No Behavior/Cognition: Alert;Requires cueing;Other (Comment);Doesn't follow directions (intermittently follows directions) Oral Cavity Assessment: Within Functional Limits Oral Care Completed by SLP: No Oral Cavity - Dentition: Other (Comment) (few lower teeth, no uppers - pt reports she does not wear dentures consistently for po) Vision: Functional for self-feeding Self-Feeding Abilities:  (needed assist from SLP but nurse tech reports pt can feed herself) Patient Positioning: Partially reclined (pt declined to sit fully upright reporting pain) Baseline Vocal Quality: Low vocal intensity Volitional Cough: Cognitively unable to elicit Volitional Swallow: Unable to elicit    Oral/Motor/Sensory Function Overall Oral Motor/Sensory Function: Generalized oral weakness (generalized weakness)   Ice Chips Ice chips: Not tested   Thin Liquid Thin Liquid: Within functional limits Presentation: Straw    Nectar Thick Nectar Thick Liquid: Not tested   Honey Thick Honey Thick Liquid: Not tested   Puree Puree: Not tested   Solid   GO   Solid: Impaired Oral Phase Impairments: Reduced lingual movement/coordination;Impaired mastication;Reduced labial seal Oral Phase Functional Implications: Prolonged oral transit;Impaired mastication Other Comments: slow but adequate mastication        Mills Koller, MS [facility] SLP [phone]

## 2016-09-18 NOTE — Progress Notes (Signed)
Received phone call back from [place]. She and family in agreement with SNF. [facility]. CSW informed. CM following.

## 2016-09-18 NOTE — Progress Notes (Signed)
Family is unable to meet on Friday 09/20/16 with Palliative team. Family (daughter Elnita MaxwellCheryl) willing to meet on Thursday 09/19/16 at 11AM. Palliative called and left message.    Sim BoastHavy, RN

## 2016-09-18 NOTE — Clinical Social Work Note (Signed)
CSW met with patient's daughter, Lisa [name], and provided bed offers. Discussed that since patient's Medicare days are used up, she will be going in with Medicaid. CSW explained that she will have to be there for a minimum of 30 days or Medicaid won't pay, that Medicaid does not pay for therapies, and her Medicaid check will go to the facility except for around $30. Patient's daughter expressed understanding. Patient's daughter stated that they have to go to a funeral on Friday so they are trying to reschedule the palliative meeting for tomorrow.  Dayton Scrape, [place]

## 2016-09-18 NOTE — Progress Notes (Signed)
SCDs unavailable at this time. Will apply once available.   Sim BoastHavy, RN

## 2016-09-18 NOTE — Consult Note (Signed)
Consultation Note Date: 09/18/2016   Patient Name: Lisa [name]  DOB: [birth year]  MRN: [id number]  Age / Sex: 76 y.o., female  PCP: Laurena Slimmer, MD Referring Physician: Alison Murray, MD  Reason for Consultation: Establishing goals of care  HPI/Patient Profile: 76 y.o. female  with past medical history of DM, CHF, CAD (s/p CABG) A.Fib, HTN, hyperlipidemia, left femur fx s/p ORIF 08/05/16, hydronephrosis w/ cystoscopy and uretal stent placement admitted on 09/11/2016 with altered mental status. Workup revealed CBG hypoglycemia, but mental status did not improve with resolving blood sugar. She also was found to have VRE and frank hematuria, as well as entercoccus faecalis in blood culture. During admission she has had prolonged encephalopathy, EEG shows diffuse cerebral dysfunction of non-specific etiology. She continues to refuse food and medications. Palliative medicine consulted for GOC.   Clinical Assessment and Goals of Care: Evaluated patient at bedside. She briefly opened her eyes to my voice and light touch. I asked her to open her eyes and she stated "my eyes are open", but they were closed. Her breakfast tray remained in front of her untouched. Nursing note from today states she declined medications and food today.  Spoke with her daughter. Transportation prevents her from coming to hospital until Friday. Meeting planned for Friday morning at 10am with patient's 2 daughters.   Primary Decision Maker NEXT OF KIN - 2 daughters    SUMMARY OF RECOMMENDATIONS -Family meeting for GOC planned for Friday 3/16 @10am     Code Status/Advance Care Planning:  Full code   Palliative Prophylaxis:   Delirium Protocol  Prognosis:    Unable to determine  Discharge Planning: To Be Determined  Primary Diagnoses: Present on Admission: . Chronic atrial fibrillation (HCC) . Anemia, normocytic  normochromic . Hypoglycemia . Femur fracture, left (HCC)   I have reviewed the medical record, interviewed the patient and family, and examined the patient. The following aspects are pertinent.  Past Medical History:  Diagnosis Date  . Chronic diastolic CHF (congestive heart failure) (HCC)   . Coronary artery disease    s/p CABG  . Diabetes mellitus without complication (HCC)   . Hyperlipidemia   . Hypertension   . Morbid obesity (HCC)   . OSA (obstructive sleep apnea)    mild OSA with AHI 7.15 now on CPAP at 16cm H2O  . RBBB    Social History   Social History  . Marital status: Widowed    Spouse name: N/A  . Number of children: N/A  . Years of education: N/A   Social History Main Topics  . Smoking status: Former Games developer  . Smokeless tobacco: Never Used  . Alcohol use No  . Drug use: No  . Sexual activity: No   Other Topics Concern  . None   Social History Narrative  . None   Family History  Problem Relation Age of Onset  . Heart attack Mother   . Heart disease Mother   . Stomach cancer Father    Scheduled Meds: . apixaban  2.5 mg Oral BID  . atorvastatin  10 mg Oral q1800  . budesonide  1 mg Nebulization BID  . cholecalciferol  1,000 Units Oral Daily  . collagenase   Topical Daily  . feeding supplement (ENSURE ENLIVE)  237 mL Oral BID BM  . ferrous sulfate  325 mg Oral TID WC  . linezolid  600 mg Oral Q12H  . metoprolol succinate  50 mg Oral Daily  . multivitamin with minerals  1 tablet Oral Daily  . nutrition supplement (JUVEN)  1 packet Oral BID WC  . oxybutynin  5 mg Oral BID  . pantoprazole  40 mg Oral Daily   Continuous Infusions: . dextrose 5 % and 0.45% NaCl 1,000 mL (09/18/16 0238)   PRN Meds:.acetaminophen, diphenhydrAMINE, ipratropium-albuterol, magic mouthwash, polyethylene glycol Medications Prior to Admission:  Prior to Admission medications   Medication Sig Start Date End Date Taking? Authorizing Provider  acetaminophen (TYLENOL) 325  MG tablet Take 650 mg by mouth every 4 (four) hours as needed for moderate pain.   Yes Historical Provider, MD  amLODipine (NORVASC) 5 MG tablet Take 1 tablet (5 mg total) by mouth daily. 09/20/15  Yes Quintella Reichert, MD  apixaban (ELIQUIS) 2.5 MG TABS tablet Take 1 tablet (2.5 mg total) by mouth 2 (two) times daily. 08/10/16  Yes Omair Latif Sheikh, DO  atorvastatin (LIPITOR) 10 MG tablet Take 1 tablet (10 mg total) by mouth daily at 6 PM. 05/08/16  Yes Elease Etienne, MD  budesonide (PULMICORT) 0.5 MG/2ML nebulizer solution Take 4 mLs (1 mg total) by nebulization 2 (two) times daily. 08/10/16  Yes Omair Latif Sheikh, DO  buPROPion (WELLBUTRIN XL) 150 MG 24 hr tablet Take 150 mg by mouth daily. Reported on 08/02/2015   Yes Historical Provider, MD  cholecalciferol (VITAMIN D) 1000 units tablet Take 1,000 Units by mouth daily.   Yes Historical Provider, MD  collagenase (SANTYL) ointment Apply 1 application topically 2 (two) times daily.   Yes Historical Provider, MD  diphenhydrAMINE (BENADRYL) 25 MG tablet Take 25 mg by mouth every 6 (six) hours as needed for itching.   Yes Historical Provider, MD  ertapenem (INVANZ) 1 g injection Inject 1 g into the muscle daily.   Yes Historical Provider, MD  ferrous sulfate 325 (65 FE) MG tablet Take 325 mg by mouth 3 (three) times daily with meals.   Yes Historical Provider, MD  fluticasone (FLOVENT HFA) 220 MCG/ACT inhaler Inhale 2 puffs into the lungs 2 (two) times daily as needed (shortness of breath/ wheezing).   Yes Historical Provider, MD  furosemide (LASIX) 20 MG tablet Take 20 mg by mouth daily.   Yes Historical Provider, MD  hydrALAZINE (APRESOLINE) 25 MG tablet Take 1 tablet (25 mg total) by mouth every 8 (eight) hours. 06/13/16  Yes Rhetta Mura, MD  HYDROcodone-acetaminophen (NORCO/VICODIN) 5-325 MG tablet Take 1-2 tablets by mouth every 6 (six) hours as needed for moderate pain. 08/09/16  Yes Naida Sleight, PA-C  ipratropium-albuterol (DUONEB) 0.5-2.5  (3) MG/3ML SOLN Take 3 mLs by nebulization every 6 (six) hours as needed. Wheezing or shortness of breath. Patient taking differently: Take 3 mLs by nebulization every 6 (six) hours as needed (shortness of breath/ wheezing).  05/08/16  Yes Elease Etienne, MD  isosorbide mononitrate (IMDUR) 30 MG 24 hr tablet Take 1 tablet (30 mg total) by mouth daily. 06/13/16  Yes Rhetta Mura, MD  metoprolol succinate (TOPROL-XL) 50 MG 24 hr tablet Take 1 tablet (50 mg total) by  mouth daily. Take with or immediately following a meal. 06/13/16  Yes Rhetta MuraJai-Gurmukh Samtani, MD  omeprazole (PRILOSEC) 20 MG capsule Take 20 mg by mouth daily. 07/28/16  Yes Historical Provider, MD  oxybutynin (DITROPAN) 5 MG tablet Take 5 mg by mouth 2 (two) times daily.   Yes Historical Provider, MD  polyethylene glycol (MIRALAX / GLYCOLAX) packet Take 17 g by mouth daily as needed for mild constipation. 08/10/16  Yes Omair Latif Sheikh, DO  PROMETHEGAN 25 MG suppository Place 25 mg rectally daily as needed for nausea or vomiting.  07/28/16  Yes Historical Provider, MD  Skin Protectants, Misc. (EUCERIN) cream Apply 1 application topically daily.   Yes Historical Provider, MD  sodium hypochlorite (DAKIN'S 1/4 STRENGTH) 0.125 % SOLN Irrigate with 1 application as directed 2 (two) times daily.   Yes Historical Provider, MD  OXYGEN Inhale 2 L into the lungs continuous.    Historical Provider, MD   No Known Allergies Review of Systems  Unable to perform ROS: Mental status change    Physical Exam  Constitutional: She appears well-developed and well-nourished.  Cardiovascular: Regular rhythm.   Pulmonary/Chest: Effort normal and breath sounds normal.  Abdominal: Soft. Bowel sounds are normal.  Genitourinary:  Genitourinary Comments: Foley in place, hematuria  Neurological:  Lethargic, unarouseable  Nursing note and vitals reviewed.   Vital Signs: BP (!) 109/50 (BP Location: Right Arm)   Pulse 74   Temp 97.8 F (36.6 C) (Oral)    Resp 20   Ht 5\' 5"  (1.651 m)   Wt 104.3 kg (229 lb 15 oz)   LMP  (LMP Unknown)   SpO2 93%   BMI 38.26 kg/m  Pain Assessment: 0-10   Pain Score: Asleep   SpO2: SpO2: 93 % O2 Device:SpO2: 93 % O2 Flow Rate: .O2 Flow Rate (L/min): 2 L/min  IO: Intake/output summary:  Intake/Output Summary (Last 24 hours) at 09/18/16 0946 Last data filed at 09/17/16 1900  Gross per 24 hour  Intake             1350 ml  Output              400 ml  Net              950 ml    LBM: Last BM Date: 09/17/16 Baseline Weight: Weight: 104.3 kg (229 lb 15 oz) Most recent weight: Weight: 104.3 kg (229 lb 15 oz)     Palliative Assessment/Data: PPS: 10%   Flowsheet Rows   Flowsheet Row Most Recent Value  Intake Tab  Referral Department  Hospitalist  Unit at Time of Referral  Med/Surg Unit  Palliative Care Primary Diagnosis  Neurology  Date Notified  09/17/16  Palliative Care Type  Return patient Palliative Care  Reason for referral  Clarify Goals of Care  Date of Admission  09/11/16  # of days IP prior to Palliative referral  6  Clinical Assessment  Psychosocial & Spiritual Assessment  Palliative Care Outcomes      Thank you for this consult. Palliative medicine will continue to follow and assist as needed.    Time Total:60 minutes Greater than 50%  of this time was spent counseling and coordinating care related to the above assessment and plan.  Signed by: Ocie BobKasie Maston Wight, AGNP-C Palliative Medicine    Please contact Palliative Medicine Team phone at [phone] for questions and concerns.  For individual provider: See Loretha StaplerAmion

## 2016-09-19 DIAGNOSIS — Z515 Encounter for palliative care: Secondary | ICD-10-CM

## 2016-09-19 DIAGNOSIS — Z7189 Other specified counseling: Secondary | ICD-10-CM

## 2016-09-19 DIAGNOSIS — R627 Adult failure to thrive: Secondary | ICD-10-CM

## 2016-09-19 LAB — GLUCOSE, CAPILLARY
GLUCOSE-CAPILLARY: 80 mg/dL (ref 65–99)
Glucose-Capillary: 68 mg/dL (ref 65–99)
Glucose-Capillary: 72 mg/dL (ref 65–99)
Glucose-Capillary: 72 mg/dL (ref 65–99)
Glucose-Capillary: 78 mg/dL (ref 65–99)
Glucose-Capillary: 79 mg/dL (ref 65–99)
Glucose-Capillary: 84 mg/dL (ref 65–99)
Glucose-Capillary: 89 mg/dL (ref 65–99)

## 2016-09-19 LAB — BASIC METABOLIC PANEL
Anion gap: 6 (ref 5–15)
BUN: 15 mg/dL (ref 6–20)
CO2: 18 mmol/L — AB (ref 22–32)
Calcium: 8.3 mg/dL — ABNORMAL LOW (ref 8.9–10.3)
Chloride: 109 mmol/L (ref 101–111)
Creatinine, Ser: 1.11 mg/dL — ABNORMAL HIGH (ref 0.44–1.00)
GFR calc Af Amer: 54 mL/min — ABNORMAL LOW (ref >60)
GFR, EST NON AFRICAN AMERICAN: 47 mL/min — AB (ref >60)
GLUCOSE: 79 mg/dL (ref 65–99)
POTASSIUM: 3.7 mmol/L (ref 3.5–5.1)
Sodium: 133 mmol/L — ABNORMAL LOW (ref 135–145)

## 2016-09-19 LAB — CBC
HCT: 25.7 % — ABNORMAL LOW (ref 36.0–46.0)
Hemoglobin: 8.1 g/dL — ABNORMAL LOW (ref 12.0–15.0)
MCH: 29.5 pg (ref 26.0–34.0)
MCHC: 31.5 g/dL (ref 30.0–36.0)
MCV: 93.5 fL (ref 78.0–100.0)
PLATELETS: 92 10*3/uL — AB (ref 150–400)
RBC: 2.75 MIL/uL — AB (ref 3.87–5.11)
RDW: 24.2 % — AB (ref 11.5–15.5)
WBC: 7 10*3/uL (ref 4.0–10.5)

## 2016-09-19 MED ORDER — DICLOFENAC SODIUM 1 % TD GEL
2.0000 g | Freq: Four times a day (QID) | TRANSDERMAL | Status: DC
  Administered 2016-09-19 – 2016-09-23 (×12): 2 g via TOPICAL
  Filled 2016-09-19 (×2): qty 100

## 2016-09-19 MED ORDER — ACETAMINOPHEN 325 MG PO TABS
650.0000 mg | ORAL_TABLET | Freq: Three times a day (TID) | ORAL | Status: DC
  Administered 2016-09-19 – 2016-09-24 (×15): 650 mg via ORAL
  Filled 2016-09-19 (×15): qty 2

## 2016-09-19 MED ORDER — APIXABAN 2.5 MG PO TABS
2.5000 mg | ORAL_TABLET | Freq: Two times a day (BID) | ORAL | Status: DC
  Administered 2016-09-19 – 2016-09-20 (×3): 2.5 mg via ORAL
  Filled 2016-09-19 (×3): qty 1

## 2016-09-19 MED ORDER — BUPROPION HCL ER (XL) 150 MG PO TB24
150.0000 mg | ORAL_TABLET | Freq: Every day | ORAL | Status: DC
  Administered 2016-09-19 – 2016-09-24 (×6): 150 mg via ORAL
  Filled 2016-09-19 (×6): qty 1

## 2016-09-19 NOTE — Progress Notes (Signed)
Patient ID: Lisa [name], female   DOB: 08-01-1940, 76 y.o.   MRN: [id number]  PROGRESS NOTE    CHEALSEY MIYAMOTO  YQM:[id number] DOB: Aug 07, 1940 DOA: 09/11/2016  PCP: Laurena Slimmer, MD   Brief Narrative:  76 year old female with history of diabetes mellitus, chronic diastolic CHF, CAD status post CABG, paroxysmal A. fib on eliquis, (s/p cardioversion in 07/2015) OSA on CPAP, hypertension, hyperlipidemia, left femur fracture status post ORIF in 08/05/2016, hydronephrosis requiring cystoscopy with retrograde pyelogram and ureteral stent placement during the same hospitalization. Pt was admitted 2/17 for acute on chronic anemia and received 1 unit PRBC, hemoccult was negative but she did have gross hematuria for several weeks. Patient was in SNF where she was found to be confused with having stroke-like symptoms. CBG was in 40's and she was given D50 but her altered mental status did not improve.   In the ED she was hemodynamically stable. Blood work was notable for hemoglobin of 7.1 with.  UA again showed frank hematuria. She was found to have VRE on urine culture for which she is on Linezolid. Also, one of the blood cx grew enterococcus faecalis while other blood cx showed no growth.  Assessment & Plan:   Acute metabolic encephalopathy - Prolonged symptoms; unclear etiology although thought to be from hypoglycemia and UTI, bacteremia - No significant changes in mental status over past 24-48 hours  - CT head with no acute findings - EEG 3/12 showed diffuse cerebral dysfunction that is non-specific in etiology and can be seen with hypoxic/ischemic injury, toxic/metabolic encephalopathies, neurodegenerative disorders, or medication effect.  - Avoid narcotics, benzos - Recent TSH and  b12 normal - Pt has received lactulose for hyperammonemia but her ammonia on 3/12 was WNL  VRE UTI - Continue Linezolid  Enterococcus faecalis bacteremia - Continue linezolid One blood cx grew enterococcus  faecalis but second blood cx showed no growth   Chronic atrial fibrillation - CHADS vasc score 4 - Due to drop in hemoglobin and platelet count 3/13 to 3/14 we held apixaban - Since platelets and hgb stable this am will resume apixaban today and monitor CBC  - If any drop in platelet or hgb in next 24 hours then we will have to hold apixaban for longer duration - Continue metoprolol for rate control   Chronic kidney disease stage 4 - Baseline Cr in 07/2016 was 2.72 - Cr is within baseline range   Anemia of chronic disease / IDA - Continue ferrous sulfate supplementation  - Hemoglobin down to 7.7, from 9.1 from 3/13 to 3/14 - Hgb stable this am - FOBT not yet collected, will follow upon results once completed   Thrombocytopenia - Drop in platelets from 145 to 92 this am - Stopped apixaban 3/14 due to drop in platelets but this am platelets stable so we will resume apixaban and repeat platelet count in am   Dyslipidemia  - Continue Lipitor   Adult failure to thrive / Severe protein calorie malnutrition  - In the context of acute on chronic illness - Seen by SLP - diet regular   Essential hypertension - Continue metoprolol daily   Multiple skin issues, deep tissue injury to left heel, left calf ulceration  - Deep tissue injury: left heel; 3cm x 3cm x 0cm - Full thickness lateral left calf:  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 Change daily. Hydrogel for moist wound healing to the left medial calf wound, cover with dry dressing. Change daily. Silicone foam heel dressing to the left heel, continue use of heel protector from facility. Low air loss mattress for pressure redistribution.   DVT prophylaxis: resume apixaban  Code Status:  full code  Family Communication: no family at the bedside this am Disposition Plan: palliative care to meet again with family tomorrow    Consultants:   SLP  WOC  Nutrition   PT  Palliative   GU  Neurology  ID  Procedures:   EEG 09/16/2016 - diffuse cerebral dysfunction that is non-specific in etiology and can be seen with hypoxic/ischemic injury, toxic/metabolic encephalopathies, neurodegenerative disorders, or medication effect.   Antimicrobials:   Linezolid -->   Subjective: No overnight events. Still disoriented.  Objective: Vitals:   09/19/16 0426 09/19/16 0801 09/19/16 0927 09/19/16 1009  BP: 108/64   98/69  Pulse: (!) 122  (!) 116 (!) 117  Resp: 20   20  Temp: 99.3 F (37.4 C)  99 F (37.2 C) 99.2 F (37.3 C)  TempSrc: Axillary  Oral Oral  SpO2: 100% 100% 100% 94%  Weight:      Height:        Intake/Output Summary (Last 24 hours) at 09/19/16 1309 Last data filed at 09/19/16 0558  Gross per 24 hour  Intake             2165 ml  Output              800 ml  Net             1365 ml   Filed Weights   09/12/16 0430  Weight: 104.3 kg (229 lb 15 oz)    Examination:  General exam: no acute distress, calm  Respiratory system: bilateral air entry, no wheezing  Cardiovascular system: S1 & S2 heard, Rate controlled  Gastrointestinal system: (+ BS, non tender, non distended  Central nervous system: little restless, non focal  Extremities: No tenderness, palpable pulses bilaterally  Skin: she has deep tissue injury to left heel, ulcer on lateral and medical left calf Psychiatry: slightly restless but not agitated   Data Reviewed: I have personally reviewed following labs and imaging studies  CBC:  Recent Labs Lab 09/14/16 0414 09/15/16 1014 09/17/16 0425 09/18/16 0614 09/19/16 0401  WBC 8.1 7.7 8.7 8.6 7.0  HGB 8.1* 8.0* 9.1* 7.7* 8.1*  HCT 26.0* 25.3* 28.9* 23.6* 25.7*  MCV 92.2 92.7 92.0 92.2 93.5  PLT 178 166 145* 92* 92*   Basic  Metabolic Panel:  Recent Labs Lab 09/14/16 0414 09/15/16 1014 09/16/16 1051 09/18/16 0614 09/19/16 0401  NA 134* 134* 134* 134* 133*  K 4.0 4.0 3.8 5.0 3.7  CL 106 108 109 110 109  CO2 20* 20* 19* 16* 18*  GLUCOSE 78 77 77 108* 79  BUN 25* 22* 19 16 15   CREATININE 1.40* 1.30* 1.19* 1.08* 1.11*  CALCIUM 8.3* 8.2* 8.4* 8.4* 8.3*   GFR: Estimated Creatinine Clearance: 50.9 mL/min (A) (by C-G formula based on SCr of 1.11 mg/dL (H)). Liver Function Tests: No results for input(s): AST, ALT, ALKPHOS, BILITOT, PROT, ALBUMIN in the last 168 hours. No results for input(s): LIPASE, AMYLASE in the last 168 hours.  Recent Labs Lab 09/15/16 1137 09/16/16 1051  AMMONIA 70* 32   Coagulation Profile: No results for input(s): INR, PROTIME in  the last 168 hours. Cardiac Enzymes: No results for input(s): CKTOTAL, CKMB, CKMBINDEX, TROPONINI in the last 168 hours. BNP (last 3 results) No results for input(s): PROBNP in the last 8760 hours. HbA1C: No results for input(s): HGBA1C in the last 72 hours. CBG:  Recent Labs Lab 09/19/16 0013 09/19/16 0059 09/19/16 0431 09/19/16 0753 09/19/16 1104  GLUCAP 68 72 84 89 79   Lipid Profile: No results for input(s): CHOL, HDL, LDLCALC, TRIG, CHOLHDL, LDLDIRECT in the last 72 hours. Thyroid Function Tests: No results for input(s): TSH, T4TOTAL, FREET4, T3FREE, THYROIDAB in the last 72 hours. Anemia Panel: No results for input(s): VITAMINB12, FOLATE, FERRITIN, TIBC, IRON, RETICCTPCT in the last 72 hours. Urine analysis:    Component Value Date/Time   COLORURINE BROWN (A) 09/11/2016 2305   APPEARANCEUR TURBID (A) 09/11/2016 2305   LABSPEC 1.015 09/11/2016 2305   PHURINE 5.0 09/11/2016 2305   GLUCOSEU 50 (A) 09/11/2016 2305   HGBUR MODERATE (A) 09/11/2016 2305   BILIRUBINUR NEGATIVE 09/11/2016 2305   KETONESUR NEGATIVE 09/11/2016 2305   PROTEINUR 100 (A) 09/11/2016 2305   UROBILINOGEN 1.0 08/20/2014 1130   NITRITE NEGATIVE 09/11/2016  2305   LEUKOCYTESUR SMALL (A) 09/11/2016 2305   Sepsis Labs: @LABRCNTIP (procalcitonin:4,lacticidven:4)  Urine culture     Status: Abnormal   Collection Time: 09/11/16 11:05 PM  Result Value Ref Range Status   Specimen Description URINE, RANDOM  Final   Special Requests NONE  Final   Culture (A)  Final    >=100,000 COLONIES/mL VANCOMYCIN RESISTANT ENTEROCOCCUS   Report Status 09/14/2016 FINAL  Final   Organism ID, Bacteria VANCOMYCIN RESISTANT ENTEROCOCCUS (A)  Final      Susceptibility   Vancomycin resistant enterococcus - MIC*    AMPICILLIN >=32 RESISTANT Resistant     LEVOFLOXACIN >=8 RESISTANT Resistant     NITROFURANTOIN 256 RESISTANT Resistant     VANCOMYCIN >=32 RESISTANT Resistant     LINEZOLID 2 SENSITIVE Sensitive     * >=100,000 COLONIES/mL VANCOMYCIN RESISTANT ENTEROCOCCUS  Blood culture (routine x 2)     Status: Abnormal   Collection Time: 09/12/16  1:05 AM  Result Value Ref Range Status   Specimen Description BLOOD LEFT ARM  Final   Special Requests BOTTLES DRAWN AEROBIC AND ANAEROBIC  Final   Culture  Setup Time   Final   Culture ENTEROCOCCUS FAECALIS (A)  Final   Report Status 09/15/2016 FINAL  Final      Susceptibility   Enterococcus faecalis - MIC*    AMPICILLIN <=2 SENSITIVE Sensitive     VANCOMYCIN 1 SENSITIVE Sensitive     GENTAMICIN SYNERGY RESISTANT Resistant     * ENTEROCOCCUS FAECALIS  Blood Culture ID Panel (Reflexed)     Status: None   Collection Time: 09/12/16  1:05 AM  Result Value Ref Range Status   Enterococcus species NOT DETECTED NOT DETECTED Final   Vancomycin resistance NOT DETECTED NOT DETECTED Final   Listeria monocytogenes NOT DETECTED NOT DETECTED Final   Staphylococcus species NOT DETECTED NOT DETECTED Final   Staphylococcus aureus NOT DETECTED NOT DETECTED Final   Streptococcus species NOT DETECTED NOT DETECTED Final   Streptococcus agalactiae NOT DETECTED NOT DETECTED Final   Streptococcus pneumoniae NOT DETECTED NOT DETECTED  Final   Streptococcus pyogenes NOT DETECTED NOT DETECTED Final   Acinetobacter baumannii NOT DETECTED NOT DETECTED Final   Enterobacteriaceae species NOT DETECTED NOT DETECTED Final   Enterobacter cloacae complex NOT DETECTED NOT DETECTED Final   Escherichia coli NOT DETECTED NOT  DETECTED Final   Klebsiella oxytoca NOT DETECTED NOT DETECTED Final   Klebsiella pneumoniae NOT DETECTED NOT DETECTED Final   Proteus species NOT DETECTED NOT DETECTED Final   Serratia marcescens NOT DETECTED NOT DETECTED Final   Haemophilus influenzae NOT DETECTED NOT DETECTED Final   Neisseria meningitidis NOT DETECTED NOT DETECTED Final   Pseudomonas aeruginosa NOT DETECTED NOT DETECTED Final   Candida albicans NOT DETECTED NOT DETECTED Final   Candida glabrata NOT DETECTED NOT DETECTED Final   Candida krusei NOT DETECTED NOT DETECTED Final   Candida parapsilosis NOT DETECTED NOT DETECTED Final   Candida tropicalis NOT DETECTED NOT DETECTED Final  Blood culture (routine x 2)     Status: None (Preliminary result)   Collection Time: 09/12/16  5:55 AM  Result Value Ref Range Status   Specimen Description BLOOD RIGHT ARM  Final   Special Requests IN PEDIATRIC BOTTLE  2CC  Final   Culture NO GROWTH 4 DAYS  Final   Report Status PENDING  Incomplete      Radiology Studies: Ct Head Wo Contrast Result Date: 09/16/2016 No acute abnormality. Stable atrophy and chronic small vessel white matter ischemic changes.    Marland Kitchen acetaminophen  650 mg Oral TID  . atorvastatin  10 mg Oral q1800  . budesonide  1 mg Nebulization BID  . buPROPion  150 mg Oral Daily  . cholecalciferol  1,000 Units Oral Daily  . collagenase   Topical Daily  . diclofenac sodium  2 g Topical QID  . feeding supplement (ENSURE ENLIVE)  237 mL Oral BID BM  . ferrous sulfate  325 mg Oral TID WC  . linezolid  600 mg Oral Q12H  . metoprolol succinate  50 mg Oral Daily  . multivitamin with minerals  1 tablet Oral Daily  . nutrition supplement  (JUVEN)  1 packet Oral BID WC  . oxybutynin  5 mg Oral BID  . pantoprazole  40 mg Oral Daily    Continuous Infusions: . dextrose 5 % and 0.45% NaCl 75 mL/hr at 09/19/16 0749     LOS: 6 days    Time spent: 15 minutes  Greater than 50% of the time spent on counseling and coordinating the care.   Manson Passey, MD [facility] Pager [phone]  If 7PM-7AM, please contact night-coverage [email or web address] Password [email or web address] 09/19/2016, 1:09 PM

## 2016-09-19 NOTE — Consult Note (Signed)
Daily Progress Note   Patient Name: Lisa [name]       Date: 09/19/2016 DOB: June 06, 1941  Age: 76 y.o. MRN#: [id number] Attending Physician: Robbie Lis, MD Primary Care Physician: Foye Spurling, MD Admit Date: 09/11/2016  Reason for Consultation/Follow-up: Establishing goals of care  Subjective: Met with patient and her two daughters, Lisa [name] and Lisa [name]. Patient was oriented today to person place and time. Tells me she hasn't had an appetite for awhile. She is depressed. Says, "who wouldn't be depressed, laying here in a hospital bed?" Chart review shows that prior to admission she was on Wellbutrin. She also complains on chronic shoulder pain s/p rotator cuff surgery. She was taking Vicodin prior to admission and this provided relief. No aggravating factors. Pain is aching, constant.  We discussed patient's last year. She has been in the hospital five times in the last six months. She has experienced episodes of CHF exacerbations, hypoglycemic events, septicemia, hip fractures. Her daughter notes that with each hospital admission she has been left in worse functional status and patient feels this way too. Patient feels depressed, that she will not get better.  We discussed that patient does have some irreversible disease processes. She has CHF, she has stage IV CKD, DM. She has severe malnutrition, making functional recovery from multiple insults very difficult. She has chronic indwelling foley leading to chronic UTI's.  We talked about patient's quality of life. Patient does not want to go to SNF. This is not a good quality of life for her. She enjoys spending time with her family and her Grandkids.  Based on patient's multiple chronic illnesses, decreased functional status, poor nutritional  status (last albumin 1.8) I discussed option of hospice and comfort measures only with focus on quality over quantity vs continued aggressive care measures.  We also discussed code status and recommendations for DNR status. Patient and daughters were very receptive to all discussed information.  As this was all new information today, they would like time to process and follow up. Tomorrow they are attending a funeral for [name]'s 86mh old grandson and cannot make a decision. They would like to reconvene over the weekend.     Review of Systems  Constitutional: Positive for malaise/fatigue and weight loss.  Respiratory: Negative for shortness of breath.   Cardiovascular: Positive for orthopnea  and leg swelling. Negative for chest pain.  Musculoskeletal: Positive for joint pain.  Psychiatric/Behavioral: Positive for depression. Negative for suicidal ideas. The patient does not have insomnia.   All other systems reviewed and are negative.   Length of Stay: 6  Current Medications: Scheduled Meds:  . atorvastatin  10 mg Oral q1800  . budesonide  1 mg Nebulization BID  . cholecalciferol  1,000 Units Oral Daily  . collagenase   Topical Daily  . feeding supplement (ENSURE ENLIVE)  237 mL Oral BID BM  . ferrous sulfate  325 mg Oral TID WC  . linezolid  600 mg Oral Q12H  . metoprolol succinate  50 mg Oral Daily  . multivitamin with minerals  1 tablet Oral Daily  . nutrition supplement (JUVEN)  1 packet Oral BID WC  . oxybutynin  5 mg Oral BID  . pantoprazole  40 mg Oral Daily    Continuous Infusions: . dextrose 5 % and 0.45% NaCl 75 mL/hr at 09/19/16 0749    PRN Meds: acetaminophen, diphenhydrAMINE, ipratropium-albuterol, magic mouthwash, polyethylene glycol  Physical Exam  Constitutional: She is oriented to person, place, and time. She appears well-developed and well-nourished. No distress.  Cardiovascular: Normal rate and regular rhythm.   Pulmonary/Chest: Effort normal and breath  sounds normal.  Abdominal: Soft. Bowel sounds are normal.  Genitourinary:  Genitourinary Comments: Foley in place, hematuria  Musculoskeletal:  Decreased ROM L leg  Neurological: She is alert and oriented to person, place, and time.  Psychiatric:  Sad affect  Nursing note and vitals reviewed.           Vital Signs: BP 98/69 (BP Location: Right Arm)   Pulse (!) 117   Temp 99.2 F (37.3 C) (Oral)   Resp 20   Ht 5\' 5"  (1.651 m)   Wt 104.3 kg (229 lb 15 oz)   LMP  (LMP Unknown)   SpO2 94%   BMI 38.26 kg/m  SpO2: SpO2: 94 % O2 Device: O2 Device: Nasal Cannula O2 Flow Rate: O2 Flow Rate (L/min): 2 L/min  Intake/output summary:  Intake/Output Summary (Last 24 hours) at 09/19/16 1214 Last data filed at 09/19/16 09/21/16  Gross per 24 hour  Intake             2165 ml  Output              800 ml  Net             1365 ml   LBM: Last BM Date: 09/17/16 Baseline Weight: Weight: 104.3 kg (229 lb 15 oz) Most recent weight: Weight: 104.3 kg (229 lb 15 oz)       Palliative Assessment/Data: PPS: 30%    Flowsheet Rows     Most Recent Value  Intake Tab  Referral Department  Hospitalist  Unit at Time of Referral  Med/Surg Unit  Palliative Care Primary Diagnosis  Neurology  Date Notified  09/17/16  Palliative Care Type  Return patient Palliative Care  Reason for referral  Clarify Goals of Care  Date of Admission  09/11/16  # of days IP prior to Palliative referral  6  Clinical Assessment  Psychosocial & Spiritual Assessment  Palliative Care Outcomes      Patient Active Problem List   Diagnosis Date Noted  . Benign essential HTN 09/18/2016  . VRE (vancomycin-resistant Enterococci) infection 09/18/2016  . Enterococcus faecalis infection 09/18/2016  . CKD (chronic kidney disease) stage 4, GFR 15-29 ml/min (HCC) 09/18/2016  . Anemia of chronic  disease 09/18/2016  . Thrombocytopenia ([place]) 09/18/2016  . Adult failure to thrive 09/18/2016  . Pressure injury of skin 09/18/2016  .  Acute encephalopathy   . Chronic anticoagulation 06/07/2016  . Chronic atrial fibrillation [facility])     Palliative Care Assessment & Plan   Patient Profile: 76 y.o. female  with past medical history of DM, CHF, CAD (s/p CABG) A.Fib, HTN, hyperlipidemia, left femur fx s/p ORIF 08/05/16, hydronephrosis w/ cystoscopy and uretal stent placement admitted on 09/11/2016 with altered mental status. Workup revealed CBG hypoglycemia, but mental status did not improve with resolving blood sugar. She also was found to have VRE and frank hematuria, as well as entercoccus faecalis in blood culture. During admission she has had prolonged encephalopathy, EEG shows diffuse cerebral dysfunction of non-specific etiology. She had decreased po intake and was not taking po medications. Palliative medicine consulted for [place].   Assessment/Recommendations/Plan   Restart home Wellbutrin XL '150mg'$  QD- this is not generally sedating, in fact may be somewhat stimulating  Voltaren gel to R shoulder for shoulder pain  Scheduled Tylenol '650mg'$  for shoulder pain - I am reluctant to restart narcotic as this may be sedating  I will continue to follow with family for [place]    Goals of Care and Additional Recommendations:  Limitations on Scope of Treatment: Full Scope Treatment  Code Status:  Full code  Prognosis:   < 6 months due multiple comorbidities, poor functional and nutritional status, albumin 1.8, FTT, hip fracture  Discharge Planning:  To Be Determined  Care plan was discussed with patient and family.  Thank you for allowing the Palliative Medicine Team to assist in the care of this patient.  Time In: 1045 Time Out: 1230 Prolonged Bill Time: No  Greater than 50%  of this time was spent counseling and coordinating care related to the above assessment and plan.  Mariana Kaufman, AGNP-C Palliative Medicine   Please contact Palliative Medicine Team phone at [phone] for questions and concerns.

## 2016-09-19 NOTE — Progress Notes (Signed)
PT Cancellation Note  Patient Details Name: Lisa [name] MRN: [id number] DOB: 1940/09/26   Cancelled Treatment:    Reason Eval/Treat Not Completed: Fatigue/lethargy limiting ability to participate; patient reports did not sleep well last night.  Refusing EOB activities and in bed activity even with encouragement.  Discussed with pt if wants to try tomorrow.  Will attempt again tomorrow.    Elray McgregorCynthia Titilayo Hagans 09/19/2016, 4:27 PM  Sheran Lawlessyndi Nohemi Nicklaus, PT [phone] 09/19/2016

## 2016-09-19 NOTE — Progress Notes (Signed)
Patient noted more alert today, able to eat minimal amount of food and take her pills. Dressing changes with no c/o pain although the reposition did bother patient.    Sim BoastHavy, RN

## 2016-09-20 DIAGNOSIS — N183 Chronic kidney disease, stage 3 (moderate): Secondary | ICD-10-CM

## 2016-09-20 DIAGNOSIS — N179 Acute kidney failure, unspecified: Secondary | ICD-10-CM

## 2016-09-20 LAB — GLUCOSE, CAPILLARY
GLUCOSE-CAPILLARY: 73 mg/dL (ref 65–99)
GLUCOSE-CAPILLARY: 84 mg/dL (ref 65–99)
GLUCOSE-CAPILLARY: 81 mg/dL (ref 65–99)
GLUCOSE-CAPILLARY: 70 mg/dL (ref 65–99)
Glucose-Capillary: 90 mg/dL (ref 65–99)

## 2016-09-20 LAB — BASIC METABOLIC PANEL
ANION GAP: 4 — AB (ref 5–15)
BUN: 16 mg/dL (ref 6–20)
CHLORIDE: 111 mmol/L (ref 101–111)
CO2: 21 mmol/L — ABNORMAL LOW (ref 22–32)
Calcium: 8.2 mg/dL — ABNORMAL LOW (ref 8.9–10.3)
Creatinine, Ser: 1.17 mg/dL — ABNORMAL HIGH (ref 0.44–1.00)
GFR calc non Af Amer: 44 mL/min — ABNORMAL LOW (ref >60)
GFR, EST AFRICAN AMERICAN: 51 mL/min — AB (ref >60)
GLUCOSE: 84 mg/dL (ref 65–99)
Potassium: 3.7 mmol/L (ref 3.5–5.1)
Sodium: 136 mmol/L (ref 135–145)

## 2016-09-20 LAB — CBC
HEMATOCRIT: 24.6 % — AB (ref 36.0–46.0)
HEMOGLOBIN: 7.8 g/dL — AB (ref 12.0–15.0)
MCH: 29.9 pg (ref 26.0–34.0)
MCHC: 31.7 g/dL (ref 30.0–36.0)
MCV: 94.3 fL (ref 78.0–100.0)
Platelets: 79 10*3/uL — ABNORMAL LOW (ref 150–400)
RBC: 2.61 MIL/uL — AB (ref 3.87–5.11)
RDW: 24.4 % — ABNORMAL HIGH (ref 11.5–15.5)
WBC: 6.7 10*3/uL (ref 4.0–10.5)

## 2016-09-20 NOTE — Progress Notes (Signed)
Patient ID: Lisa [name], female   DOB: Nov 12, 1940, 76 y.o.   MRN: [id number]  PROGRESS NOTE    Lisa [name]  NWG:[id number] DOB: Dec 13, 1940 DOA: 09/11/2016  PCP: Laurena Slimmer, MD   Brief Narrative:  76 year old female with history of diabetes mellitus, chronic diastolic CHF, CAD status post CABG, paroxysmal A. fib on eliquis, (s/p cardioversion in 07/2015) OSA on CPAP, hypertension, hyperlipidemia, left femur fracture status post ORIF in 08/05/2016, hydronephrosis requiring cystoscopy with retrograde pyelogram and ureteral stent placement during the same hospitalization. Pt was admitted 2/17 for acute on chronic anemia and received 1 unit PRBC, hemoccult was negative but she did have gross hematuria for several weeks. Patient was in SNF where she was found to be confused with having stroke-like symptoms. CBG was in 40's and she was given D50 but her altered mental status did not improve.   In the ED she was hemodynamically stable. Blood work was notable for hemoglobin of 7.1 with.  UA again showed frank hematuria. She was found to have VRE on urine culture for which she is on Linezolid. Also, one of the blood cx grew enterococcus faecalis while other blood cx showed no growth.  Assessment & Plan:   Acute metabolic encephalopathy - Now oriented to time, place and person, much better mental status this am - CT head with no acute findings - EEG 3/12 showed diffuse cerebral dysfunction that is non-specific in etiology and can be seen with hypoxic/ischemic injury, toxic/metabolic encephalopathies, neurodegenerative disorders, or medication effect.  - Avoid narcotics, benzos - Recent TSH and  b12 normal - Pt has received lactulose for hyperammonemia but her ammonia on 3/12 was WNL  VRE UTI - Continue Linezolid  Enterococcus faecalis bacteremia - Continue linezolid - One blood cx grew enterococcus faecalis but second blood cx showed no growth   Chronic atrial fibrillation - CHADS  vasc score 4 - Due to drop in hemoglobin and platelet count 3/13 to 3/14 we held apixaban - Since platelets and hgb again dropped this am with resumption of apixaban will continue to hold apixabna from today and for next 2 weeks - Continue metoprolol for rate control   Chronic kidney disease stage 4 - Baseline Cr in 07/2016 was 2.72 - Cr is within baseline range   Anemia of chronic disease / IDA - Continue ferrous sulfate supplementation   Thrombocytopenia - Drop in platelets from 145 to 92 this am - Stopped apixaban 3/14 due to drop in platelets; resumed it 3/39f but platelets down to 79 and hgb 7.8 so stop apixaban no w for at least next 2 weeks  Dyslipidemia  - Continue Lipitor   Adult failure to thrive / Severe protein calorie malnutrition  - In the context of acute on chronic illness - Seen by SLP - diet regular   Essential hypertension - Continue metoprolol daily   Multiple skin issues, deep tissue injury to left heel, left calf ulceration  - Deep tissue injury: left heel; 3cm x 3cm x 0cm - Full thickness lateral left calf:  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 Change daily. Hydrogel for moist wound healing to the left medial calf wound, cover with dry dressing. Change daily. Silicone foam heel dressing to the left heel, continue use of heel  protector from facility. Low air loss mattress for pressure redistribution.   DVT prophylaxis: resume apixaban  Code Status: full code  Family Communication: no family at the bedside this am Disposition Plan: tomorrow if blood work stable   Consultants:   SLP  WOC  Nutrition   PT  Palliative   GU  Neurology  ID  Procedures:   EEG 09/16/2016 - diffuse cerebral  dysfunction that is non-specific in etiology and can be seen with hypoxic/ischemic injury, toxic/metabolic encephalopathies, neurodegenerative disorders, or medication effect.   Antimicrobials:   Linezolid -->   Subjective: No overnight events.   Objective: Vitals:   09/20/16 0555 09/20/16 0853 09/20/16 0928 09/20/16 1015  BP: 105/62 97/65 (!) 102/48   Pulse: 77  79 77  Resp: 20  20 18   Temp: 98 F (36.7 C)  97.9 F (36.6 C)   TempSrc: Axillary  Oral   SpO2: 95%  100% 100%  Weight:      Height:        Intake/Output Summary (Last 24 hours) at 09/20/16 1259 Last data filed at 09/20/16 0556  Gross per 24 hour  Intake             1605 ml  Output              300 ml  Net             1305 ml   Filed Weights   09/12/16 0430  Weight: 104.3 kg (229 lb 15 oz)    Examination:  General exam: no distress Respiratory system: no wheezing  Cardiovascular system: S1 & S2 heard, Rate controlled  Gastrointestinal system: (+ BS, non tender Central nervous system: non focal  Extremities: palpable pulses  Skin: she has deep tissue injury to left heel, ulcer on lateral and medical left calf Psychiatry: normal mood  Data Reviewed: I have personally reviewed following labs and imaging studies  CBC:  Recent Labs Lab 09/15/16 1014 09/17/16 0425 09/18/16 0614 09/19/16 0401 09/20/16 0252  WBC 7.7 8.7 8.6 7.0 6.7  HGB 8.0* 9.1* 7.7* 8.1* 7.8*  HCT 25.3* 28.9* 23.6* 25.7* 24.6*  MCV 92.7 92.0 92.2 93.5 94.3  PLT 166 145* 92* 92* 79*   Basic Metabolic Panel:  Recent Labs Lab 09/15/16 1014 09/16/16 1051 09/18/16 0614 09/19/16 0401 09/20/16 0252  NA 134* 134* 134* 133* 136  K 4.0 3.8 5.0 3.7 3.7  CL 108 109 110 109 111  CO2 20* 19* 16* 18* 21*  GLUCOSE 77 77 108* 79 84  BUN 22* 19 16 15 16   CREATININE 1.30* 1.19* 1.08* 1.11* 1.17*  CALCIUM 8.2* 8.4* 8.4* 8.3* 8.2*   GFR: Estimated Creatinine Clearance: 48.2 mL/min (A) (by C-G formula based on SCr of 1.17 mg/dL  (H)). Liver Function Tests: No results for input(s): AST, ALT, ALKPHOS, BILITOT, PROT, ALBUMIN in the last 168 hours. No results for input(s): LIPASE, AMYLASE in the last 168 hours.  Recent Labs Lab 09/15/16 1137 09/16/16 1051  AMMONIA 70* 32   Coagulation Profile: No results for input(s): INR, PROTIME in the last 168 hours. Cardiac Enzymes: No results for input(s): CKTOTAL, CKMB, CKMBINDEX, TROPONINI in the last 168 hours. BNP (last 3 results) No results for input(s): PROBNP in the last 8760 hours. HbA1C: No results for input(s): HGBA1C in the last 72 hours. CBG:  Recent Labs Lab 09/19/16 2002 09/19/16 2329 09/20/16 0353 09/20/16 0806 09/20/16 1136  GLUCAP 72 78 90 73 84   Lipid Profile: No results for  input(s): CHOL, HDL, LDLCALC, TRIG, CHOLHDL, LDLDIRECT in the last 72 hours. Thyroid Function Tests: No results for input(s): TSH, T4TOTAL, FREET4, T3FREE, THYROIDAB in the last 72 hours. Anemia Panel: No results for input(s): VITAMINB12, FOLATE, FERRITIN, TIBC, IRON, RETICCTPCT in the last 72 hours. Urine analysis:    Component Value Date/Time   COLORURINE BROWN (A) 09/11/2016 2305   APPEARANCEUR TURBID (A) 09/11/2016 2305   LABSPEC 1.015 09/11/2016 2305   PHURINE 5.0 09/11/2016 2305   GLUCOSEU 50 (A) 09/11/2016 2305   HGBUR MODERATE (A) 09/11/2016 2305   BILIRUBINUR NEGATIVE 09/11/2016 2305   KETONESUR NEGATIVE 09/11/2016 2305   PROTEINUR 100 (A) 09/11/2016 2305   UROBILINOGEN 1.0 08/20/2014 1130   NITRITE NEGATIVE 09/11/2016 2305   LEUKOCYTESUR SMALL (A) 09/11/2016 2305   Sepsis Labs: @LABRCNTIP (procalcitonin:4,lacticidven:4)  Urine culture     Status: Abnormal   Collection Time: 09/11/16 11:05 PM  Result Value Ref Range Status   Specimen Description URINE, RANDOM  Final   Special Requests NONE  Final   Culture (A)  Final    >=100,000 COLONIES/mL VANCOMYCIN RESISTANT ENTEROCOCCUS   Report Status 09/14/2016 FINAL  Final   Organism ID, Bacteria  VANCOMYCIN RESISTANT ENTEROCOCCUS (A)  Final      Susceptibility   Vancomycin resistant enterococcus - MIC*    AMPICILLIN >=32 RESISTANT Resistant     LEVOFLOXACIN >=8 RESISTANT Resistant     NITROFURANTOIN 256 RESISTANT Resistant     VANCOMYCIN >=32 RESISTANT Resistant     LINEZOLID 2 SENSITIVE Sensitive     * >=100,000 COLONIES/mL VANCOMYCIN RESISTANT ENTEROCOCCUS  Blood culture (routine x 2)     Status: Abnormal   Collection Time: 09/12/16  1:05 AM  Result Value Ref Range Status   Specimen Description BLOOD LEFT ARM  Final   Special Requests BOTTLES DRAWN AEROBIC AND ANAEROBIC  Final   Culture  Setup Time   Final   Culture ENTEROCOCCUS FAECALIS (A)  Final   Report Status 09/15/2016 FINAL  Final      Susceptibility   Enterococcus faecalis - MIC*    AMPICILLIN <=2 SENSITIVE Sensitive     VANCOMYCIN 1 SENSITIVE Sensitive     GENTAMICIN SYNERGY RESISTANT Resistant     * ENTEROCOCCUS FAECALIS  Blood Culture ID Panel (Reflexed)     Status: None   Collection Time: 09/12/16  1:05 AM  Result Value Ref Range Status   Enterococcus species NOT DETECTED NOT DETECTED Final   Vancomycin resistance NOT DETECTED NOT DETECTED Final   Listeria monocytogenes NOT DETECTED NOT DETECTED Final   Staphylococcus species NOT DETECTED NOT DETECTED Final   Staphylococcus aureus NOT DETECTED NOT DETECTED Final   Streptococcus species NOT DETECTED NOT DETECTED Final   Streptococcus agalactiae NOT DETECTED NOT DETECTED Final   Streptococcus pneumoniae NOT DETECTED NOT DETECTED Final   Streptococcus pyogenes NOT DETECTED NOT DETECTED Final   Acinetobacter baumannii NOT DETECTED NOT DETECTED Final   Enterobacteriaceae species NOT DETECTED NOT DETECTED Final   Enterobacter cloacae complex NOT DETECTED NOT DETECTED Final   Escherichia coli NOT DETECTED NOT DETECTED Final   Klebsiella oxytoca NOT DETECTED NOT DETECTED Final   Klebsiella pneumoniae NOT DETECTED NOT DETECTED Final   Proteus species NOT  DETECTED NOT DETECTED Final   Serratia marcescens NOT DETECTED NOT DETECTED Final   Haemophilus influenzae NOT DETECTED NOT DETECTED Final   Neisseria meningitidis NOT DETECTED NOT DETECTED Final   Pseudomonas aeruginosa NOT DETECTED NOT DETECTED Final   Candida albicans NOT DETECTED NOT DETECTED  Final   Candida glabrata NOT DETECTED NOT DETECTED Final   Candida krusei NOT DETECTED NOT DETECTED Final   Candida parapsilosis NOT DETECTED NOT DETECTED Final   Candida tropicalis NOT DETECTED NOT DETECTED Final  Blood culture (routine x 2)     Status: None (Preliminary result)   Collection Time: 09/12/16  5:55 AM  Result Value Ref Range Status   Specimen Description BLOOD RIGHT ARM  Final   Special Requests IN PEDIATRIC BOTTLE  2CC  Final   Culture NO GROWTH 4 DAYS  Final   Report Status PENDING  Incomplete      Radiology Studies: Ct Head Wo Contrast Result Date: 09/16/2016 No acute abnormality. Stable atrophy and chronic small vessel white matter ischemic changes.    Marland Kitchen. acetaminophen  650 mg Oral TID  . atorvastatin  10 mg Oral q1800  . budesonide  1 mg Nebulization BID  . buPROPion  150 mg Oral Daily  . cholecalciferol  1,000 Units Oral Daily  . collagenase   Topical Daily  . diclofenac sodium  2 g Topical QID  . feeding supplement (ENSURE ENLIVE)  237 mL Oral BID BM  . ferrous sulfate  325 mg Oral TID WC  . linezolid  600 mg Oral Q12H  . metoprolol succinate  50 mg Oral Daily  . multivitamin with minerals  1 tablet Oral Daily  . nutrition supplement (JUVEN)  1 packet Oral BID WC  . oxybutynin  5 mg Oral BID  . pantoprazole  40 mg Oral Daily    Continuous Infusions: . dextrose 5 % and 0.45% NaCl 75 mL/hr at 09/19/16 0749     LOS: 7 days    Time spent: 15 minutes  Greater than 50% of the time spent on counseling and coordinating the care.   Manson PasseyEVINE, Oza Oberle, MD [facility] Pager [phone]  If 7PM-7AM, please contact night-coverage [email or web address] Password  [facility] 09/20/2016, 12:59 PM

## 2016-09-20 NOTE — Progress Notes (Signed)
Physical Therapy Treatment Patient Details Name: Lisa [name] MRN: [id number] DOB: June 25, 1941 Today's Date: 09/20/2016    History of Present Illness Patient is a 76 y/o female admitted from SNF due to AMS and r/o stroke, found to be anemic with Hgb 7.1 (ongoing hematuria after stent placed for hydronephrosis during admission 1/26-2/3 with L femur fx with ex-fix) and pt found to be hypoglycemic.     PT Comments    Patient much more cooperative and conversant today.  Relates she will be going home.  Feel she will need follow up HHPT, aide and equipment noted below.  Patient able to tolerate few minutes at EOB, but states she just can't tolerate that much pain.  Did note much improved tolerance to movement R shoulder.  Will follow until d/c.   Follow Up Recommendations  Home health PT [facility](HH aide)     Equipment Recommendations  Wheelchair (measurements PT);Wheelchair cushion (measurements PT);Hospital bed;Other (comment) (hoyer lift)    Recommendations for Other Services       Precautions / Restrictions Precautions Precautions: Fall Restrictions Other Position/Activity Restrictions: admission after femur fx pt in KI and 50% PWB    Mobility  Bed Mobility Overal bed mobility: Needs Assistance Bed Mobility: Supine to Sit;Sit to Supine     Supine to sit: Max assist;+2 for physical assistance Sit to supine: Max assist;+2 for physical assistance   General bed mobility comments: pt initiated with moving legs to EOB and reaching to pull up to sit, but halfway throught with pain wanted to terminate and lie back down; assist and encouragement to continue; to supine assist to lean onto L elbow and then for legs into bed  Transfers                 General transfer comment: unable at this time  Ambulation/Gait                 Stairs            Wheelchair Mobility    Modified Rankin (Stroke Patients Only)       Balance Overall balance assessment: Needs  assistance   Sitting balance-Leahy Scale: Zero Sitting balance - Comments: sat EOB about 4 minutes with max encouragement, cues and mod to max A; at times pt leaning back, and times attempting to pull forward; c/o being uncomfortable in sitting and that she doesn't tolerate pain well                            Cognition Arousal/Alertness: Awake/alert Behavior During Therapy: WFL for tasks assessed/performed Overall Cognitive Status: Within Functional Limits for tasks assessed                      Exercises General Exercises - Upper Extremity Shoulder Flexion: PROM;AAROM;10 reps;Right;Supine    General Comments        Pertinent Vitals/Pain Faces Pain Scale: Hurts even more Pain Location: generalized with mobility Pain Descriptors / Indicators: Sore Pain Intervention(s): Monitored during session;Limited activity within patient's tolerance;Repositioned    Home Living                      Prior Function            PT Goals (current goals can now be found in the care plan section) Progress towards PT goals: Progressing toward goals    Frequency    Min 2X/week  PT Plan Discharge plan needs to be updated    Co-evaluation             End of Session Equipment Utilized During Treatment: Oxygen Activity Tolerance: Patient limited by pain Patient left: in bed;with call bell/phone within reach;with bed alarm set   PT Visit Diagnosis: Muscle weakness (generalized) (M62.81);History of falling (Z91.81)     Time: 1610-9604 PT Time Calculation (min) (ACUTE ONLY): 22 min  Charges:  $Therapeutic Activity: 8-22 mins                    G Codes:       Elray Mcgregor 09/22/2016, 12:10 PM  Sheran Lawless, PT [phone] 09-22-16

## 2016-09-20 NOTE — Progress Notes (Signed)
Spoke with pt daughter over the phone, she told me "well another doctor said my mom is leaving Tuesday and that is when we will come pick her up". Pt daughter would not want to here anything from me in regards to her being stable for discharge. I even mentioned SNF placement but she said no way and pt is going home on Tuesday.Manson Passey. Tahlor Berenguer TRH [phone]

## 2016-09-20 NOTE — Progress Notes (Signed)
CM spoke to Ocie BobKasie Mahan NP with palliative care about the plans for Ms Nolen MuMcKinney. CM informed her that Dr Armando Gangevine's plan is for d/c on Saturday. CM informed her also that the family seems to be confused about the information that they have received from palliative care about when she will be ready for d/c. Lora HavensKasie is to call Dr [name] to discuss plans and timing of d/c.  Per patients daughters and Ocie BobKasie Mahan NP with palliative they have not decided if patient is to d/c with home health services versus hospice. CM following.

## 2016-09-20 NOTE — Progress Notes (Signed)
Daily Progress Note   Patient Name: Lisa [name]       Date: 09/20/2016 DOB: 04/01/1941  Age: 76 y.o. MRN#: [id number] Attending Physician: Alison Murray, MD Primary Care Physician: Laurena Slimmer, MD Admit Date: 09/11/2016  Reason for Consultation/Follow-up: Establishing goals of care  Subjective: Patient awake and conversive this morning. Tells me she doesn't want to talk about anything serious after our very serious conversation yesterday. No family at bedside. Her daughters are attending funeral for her old great grandchild today and will likely be unreachable. No complaints. She doesn't feel great but is grateful for feeling better today than she has felt recently.  No questions or complaints. No thoughts to share regarding our discussion about GOC yesterday. Does note some improvement in shoulder pain.  ROS  Length of Stay: 7  Current Medications: Scheduled Meds:  . acetaminophen  650 mg Oral TID  . apixaban  2.5 mg Oral BID  . atorvastatin  10 mg Oral q1800  . budesonide  1 mg Nebulization BID  . buPROPion  150 mg Oral Daily  . cholecalciferol  1,000 Units Oral Daily  . collagenase   Topical Daily  . diclofenac sodium  2 g Topical QID  . feeding supplement (ENSURE ENLIVE)  237 mL Oral BID BM  . ferrous sulfate  325 mg Oral TID WC  . linezolid  600 mg Oral Q12H  . metoprolol succinate  50 mg Oral Daily  . multivitamin with minerals  1 tablet Oral Daily  . nutrition supplement (JUVEN)  1 packet Oral BID WC  . oxybutynin  5 mg Oral BID  . pantoprazole  40 mg Oral Daily    Continuous Infusions: . dextrose 5 % and 0.45% NaCl 75 mL/hr at 09/19/16 0749    PRN Meds: diphenhydrAMINE, ipratropium-albuterol, magic mouthwash, polyethylene glycol  Physical Exam           Vital Signs: BP (!) 102/48 (BP Location: Right Arm)   Pulse 77   Temp 97.9 F (36.6 C) (Oral)   Resp 18   Ht 5\' 5"  (1.651 m)   Wt 104.3 kg (229 lb 15 oz)   LMP  (LMP Unknown)   SpO2 100%   BMI 38.26 kg/m  SpO2: SpO2: 100 % O2 Device: O2 Device: Nasal Cannula O2 Flow Rate: O2 Flow  Rate (L/min): 2 L/min  Intake/output summary:  Intake/Output Summary (Last 24 hours) at 09/20/16 1121 Last data filed at 09/20/16 0556  Gross per 24 hour  Intake             2610 ml  Output              700 ml  Net             1910 ml   LBM: Last BM Date: 09/17/16 Baseline Weight: Weight: 104.3 kg (229 lb 15 oz) Most recent weight: Weight: 104.3 kg (229 lb 15 oz)       Palliative Assessment/Data: PPS: 20%   Flowsheet Rows     Most Recent Value  Intake Tab  Referral Department  Hospitalist  Unit at Time of Referral  Med/Surg Unit  Palliative Care Primary Diagnosis  Neurology  Date Notified  09/17/16  Palliative Care Type  Return patient Palliative Care  Reason for referral  Clarify Goals of Care  Date of Admission  09/11/16  # of days IP prior to Palliative referral  6  Clinical Assessment  Psychosocial & Spiritual Assessment  Palliative Care Outcomes      Patient Active Problem List   Diagnosis Date Noted  . Palliative care by specialist   . Advance care planning   . Benign essential HTN 09/18/2016  . VRE (vancomycin-resistant Enterococci) infection 09/18/2016  . Enterococcus faecalis infection 09/18/2016  . CKD (chronic kidney disease) stage 4, GFR 15-29 ml/min (HCC) 09/18/2016  . Anemia of chronic disease 09/18/2016  . Thrombocytopenia (HCC) 09/18/2016  . Adult failure to thrive 09/18/2016  . Pressure injury of skin 09/18/2016  . Acute encephalopathy   . Chronic anticoagulation 06/07/2016  . Chronic atrial fibrillation [facility](HCC)     Palliative Care Assessment & Plan   Patient Profile: 76 y.o.femalewith past medical history of DM, CHF, CAD (s/p CABG) A.Fib, HTN,  hyperlipidemia, left femur fx s/p ORIF 08/05/16, hydronephrosis w/ cystoscopy and uretal stent placementadmitted on 3/7/2018with altered mental status. Workup revealed CBG hypoglycemia, but mental status did not improve with resolving blood sugar. She also was found to have VRE and frank hematuria, as well as entercoccus faecalis in blood culture. During admission she has had prolonged encephalopathy, EEG shows diffuse cerebral dysfunction of non-specific etiology. She had decreased po intake and was not taking po medications. Palliative medicine consulted for GOC.    Assessment/Recommendations/Plan   Continue voltaren gel and tylenol for shoulder pain  Continue Wellbutrin  PMT will continue to follow for GOC  Goals of Care and Additional Recommendations:  Limitations on Scope of Treatment: Full Scope Treatment  Code Status:  DNR  Prognosis:   Unable to determine  Discharge Planning:  To Be Determined    Thank you for allowing the Palliative Medicine Team to assist in the care of this patient.   Total time: 15 minutes Greater than 50%  of this time was spent counseling and coordinating care related to the above assessment and plan.  Ocie BobKasie Daviel Allegretto, AGNP-C Palliative Medicine   Please contact Palliative Medicine Team phone at [phone] for questions and concerns.

## 2016-09-20 NOTE — Progress Notes (Signed)
LCSW following for disposition. LCSW spoke with Natalia LeatherwoodKatherine (daughter) via phone regarding plan. Daughter confirms that patient will come home, but reports she is not prepared for her to come home today, nor was expecting her to come home and is upset.  Reports she will not go to SNF at discharge.  Daughter reports she is calling the doctor to verify discharge and will follow up. LCSW updated CM and barriers to discharge at this time.  Plan:  Home.  Deretha EmoryHannah Reign Dziuba LCSW, MSW Clinical Social Work: Optician, dispensingystem [employer] Coverage for :  [phone]

## 2016-09-20 NOTE — Progress Notes (Signed)
CM and CSW spoke with patients daughters this am. Per Elnita Maxwellheryl the plan is to have the patient d/c home when she is medically ready. Both daughters were under the impression that their mother would not come home until Tuesday.  CM spoke to Dr [name] and she wants to keep her another night to monitor her Hgb and Plt counts, but feels she will be ready for d/c tomorrow (Saturday).  Daughters made aware and asking to speak to Dr [name]. Dr [name] informed.

## 2016-09-21 LAB — GLUCOSE, CAPILLARY
GLUCOSE-CAPILLARY: 66 mg/dL (ref 65–99)
GLUCOSE-CAPILLARY: 72 mg/dL (ref 65–99)
GLUCOSE-CAPILLARY: 74 mg/dL (ref 65–99)
Glucose-Capillary: 78 mg/dL (ref 65–99)
Glucose-Capillary: 77 mg/dL (ref 65–99)
Glucose-Capillary: 74 mg/dL (ref 65–99)
Glucose-Capillary: 76 mg/dL (ref 65–99)

## 2016-09-21 LAB — CULTURE, BLOOD (ROUTINE X 2)
CULTURE: NO GROWTH
Culture: NO GROWTH

## 2016-09-21 LAB — CBC
HCT: 23.3 % — ABNORMAL LOW (ref 36.0–46.0)
HEMOGLOBIN: 7.6 g/dL — AB (ref 12.0–15.0)
MCH: 30.4 pg (ref 26.0–34.0)
MCHC: 32.6 g/dL (ref 30.0–36.0)
MCV: 93.2 fL (ref 78.0–100.0)
PLATELETS: 78 10*3/uL — AB (ref 150–400)
RBC: 2.5 MIL/uL — ABNORMAL LOW (ref 3.87–5.11)
RDW: 24.4 % — ABNORMAL HIGH (ref 11.5–15.5)
WBC: 6.7 10*3/uL (ref 4.0–10.5)

## 2016-09-21 MED ORDER — DEXTROSE 10 % IV SOLN
INTRAVENOUS | Status: DC
  Administered 2016-09-21: 0.639 mg/kg/min via INTRAVENOUS

## 2016-09-21 NOTE — Progress Notes (Addendum)
10:47 Spoke with daughter Lisa [name] over the phone [phone]. Updated Lisa [name] on plan for today per Dr [name]. Patient to be transfused and likely DC'd tomorrow (Sunday). Attempt to solidify disposition btwn HH and Home Hospice. Lisa [name] adamant about having a family meeting Monday with the doctor to plan DC. [name] explained that DME and home provider can be planned today, especially pending DC tomorrow. CM explained this several times, however insistant she had to call me back and hung up phone.  11:10 Lisa [name] called back and stated that she can be at hospital at 3:30. She refused to have conversation over the phone about dispo plan. Lisa [name] stated that Per Palliative Dr [name], patient would be DC'd Tuesday. CM explained to daughter that Dr [name] is attending who writes DC order, and restated that patient could DC as early as tomorrow and in order to make sure everything was taken care of we needed to start to plan DC today. Lisa [name] did state that they want to DC to home hospice. CM explained that equipment is arranged through hospice provider and not CM for patients who are DC'd to home hospice and therefore referral to hospice today would be the most benificial for patient to be able to ensure equipment could be set up for patient to arrive home as early as tomorrow. CM will leave list of home hospice providers [name]. Lisa [name] verbalized understanding of this and knows to call CM when she arrives at hospital. Verified that [address] [place] [address] [place] EPIC and will have 24/7 support by daughters. Per sign off [name] CM- [facility], although not viewable in EP[facility], [facility] and they will need to be updated if patient DC'd to hospice or home w [facility]. Patient was approved for [facility] if DC'd home, [facility] is [facility] through Saturday. DME for hospital bed ordered, not delivered at this time- on hold pending possible  dispo to Home Hospice.   14:15 Spoke with Dr [name]. Daughters requesting callback today to discuss plan. CM requested she speak with daughters and palliative to coordinate discharge plan as daughters state they were promised a DC date no sooner then Tuesday by Palliative Team to allow them time to grieve loss of infant in the family. Dr [name] provided with number for Palliative Team.

## 2016-09-21 NOTE — Care Management Important Message (Signed)
Important Message  Patient Details  Name: Lisa [name] MRN: [id number] Date of Birth: 1940-11-26   Medicare Important Message Given:  Yes Left at bedside.     Lawerance Sabalebbie Burns Timson, RN 09/21/2016, 11:25 AM

## 2016-09-21 NOTE — Progress Notes (Signed)
Patient ID: Lisa [name], female   DOB: July 23, 1940, 76 y.o.   MRN: [id number]  PROGRESS NOTE    Lisa [name]  YQM:[id number] DOB: July 23, 1940 DOA: 09/11/2016  PCP: [name],[name], MD   Brief Narrative:  76 year old female with history of diabetes mellitus, chronic diastolic CHF, CAD status post CABG, paroxysmal [name]. fib on eliquis, (s/p cardioversion in 07/2015) OSA on CPAP, hypertension, hyperlipidemia, left femur fracture status post ORIF in 08/05/2016, hydronephrosis requiring cystoscopy with retrograde pyelogram and ureteral stent placement during the same hospitalization. Pt was admitted 2/17 for acute on chronic anemia and received 1 unit PRBC, hemoccult was negative but she did have gross hematuria for several weeks. Patient was in SNF where she was found to be confused with having stroke-like symptoms. CBG was in 40's and she was given D50 but her altered mental status did not improve.   In the ED she was hemodynamically stable. Blood work was notable for hemoglobin of 7.1 with.  UA again showed frank hematuria. She was found to have VRE on urine culture for which she is on Linezolid. Also, one of the blood cx grew enterococcus faecalis while other blood cx showed no growth.  Assessment & Plan:   Acute metabolic encephalopathy - Intermittently confused  - CT head with no acute findings - EEG 3/12 showed diffuse cerebral dysfunction that is non-specific in etiology and can be seen with hypoxic/ischemic injury, toxic/metabolic encephalopathies, neurodegenerative disorders, or medication effect.  - Avoid narcotics, benzos - Recent TSH and  b12 normal - Pt has received lactulose for hyperammonemia but her ammonia on 3/12 was WNL  VRE UTI due to chronic foley catheter  - Continue Linezolid for now   Enterococcus faecalis bacteremia - Continue linezolid  - One blood cx grew enterococcus faecalis but second blood cx showed no growth   Chronic atrial fibrillation - CHADS vasc score  4 - Due to drop in hemoglobin and platelet count 3/13 to 3/14 we held apixaban; apixaban resume 3/15 - Since platelets and hgb again dropped this am with resumption of apixaban will stopped apixaban 3/16 and should be held for at least 2 weeks until counts stable  - Continue metoprolol for rate control   Chronic kidney disease stage 4 - Baseline Cr in 07/2016 was 2.72 - Cr is within baseline range   Anemia of chronic disease / IDA - Hgb 7.6 this am - Transfuse 1 U PRBC today  - Check CBC in am  Thrombocytopenia - Platelets 78 this am, overall stable   Dyslipidemia  - Continue Lipitor   Adult failure to thrive / Severe protein calorie malnutrition  - In the context of acute on chronic illness - Seen by SLP - diet regular   Essential hypertension - Continue metoprolol daily   Multiple skin issues, deep tissue injury to left heel, left calf ulceration  - Deep tissue injury: left heel; 3cm x 3cm x 0cm - Full thickness lateral left calf:  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 Change daily. Hydrogel for moist wound healing to the left medial calf wound, cover with dry dressing. Change daily. Silicone foam heel dressing to the left heel, continue use of heel protector from facility. Low air loss mattress for pressure redistribution.   DVT prophylaxis:  resume apixaban  Code Status: full code  Family Communication: no family at the bedside this am Disposition Plan: tomorrow if hgb closer to 9   Consultants:   SLP  WOC  Nutrition   PT  Palliative   GU  Neurology  ID  Procedures:   EEG 09/16/2016 - diffuse cerebral dysfunction that is non-specific in etiology and can be seen with hypoxic/ischemic injury, toxic/metabolic  encephalopathies, neurodegenerative disorders, or medication effect.   Antimicrobials:   Linezolid -->   Subjective: No overnight events.   Objective: Vitals:   09/20/16 2200 09/21/16 0200 09/21/16 0500 09/21/16 0953  BP: (!) 120/51 100/68 106/67   Pulse: 73 71 71   Resp: 20 20 20    Temp: 97.9 F (36.6 C) 97.3 F (36.3 C) 97.7 F (36.5 C)   TempSrc: Oral Oral    SpO2: 100% 100% 100% 97%  Weight:      Height:        Intake/Output Summary (Last 24 hours) at 09/21/16 1107 Last data filed at 09/21/16 0600  Gross per 24 hour  Intake          2052.58 ml  Output                0 ml  Net          2052.58 ml   Filed Weights   09/12/16 0430  Weight: 104.3 kg (229 lb 15 oz)    Examination:  General exam: no distress, calm and comfortable  Respiratory system: no wheezing, no rhonchi  Cardiovascular system: S1 & S2 heard, Rate controlled  Gastrointestinal system: (+ BS, non tender, non distended  Central nervous system: confused this am, asking to go to store quickly  Extremities: palpable pulses bilaterally  Skin: she has deep tissue injury to left heel, ulcer on lateral and medical left calf Psychiatry: not restless but she is confused   Data Reviewed: I have personally reviewed following labs and imaging studies  CBC:  Recent Labs Lab 09/17/16 0425 09/18/16 0614 09/19/16 0401 09/20/16 0252 09/21/16 0450  WBC 8.7 8.6 7.0 6.7 6.7  HGB 9.1* 7.7* 8.1* 7.8* 7.6*  HCT 28.9* 23.6* 25.7* 24.6* 23.3*  MCV 92.0 92.2 93.5 94.3 93.2  PLT 145* 92* 92* 79* 78*   Basic Metabolic Panel:  Recent Labs Lab 09/15/16 1014 09/16/16 1051 09/18/16 0614 09/19/16 0401 09/20/16 0252  NA 134* 134* 134* 133* 136  K 4.0 3.8 5.0 3.7 3.7  CL 108 109 110 109 111  CO2 20* 19* 16* 18* 21*  GLUCOSE 77 77 108* 79 84  BUN 22* 19 16 15 16   CREATININE 1.30* 1.19* 1.08* 1.11* 1.17*  CALCIUM 8.2* 8.4* 8.4* 8.3* 8.2*   GFR: Estimated Creatinine Clearance: 48.2 mL/min ([name]) (by C-G  formula based on SCr of 1.17 mg/dL (H)). Liver Function Tests: No results for input(s): AST, ALT, ALKPHOS, BILITOT, PROT, ALBUMIN in the last 168 hours. No results for input(s): LIPASE, AMYLASE in the last 168 hours.  Recent Labs Lab 09/15/16 1137 09/16/16 1051  AMMONIA 70* 32   Coagulation Profile: No results for input(s): INR, PROTIME in the last 168 hours. Cardiac Enzymes: No results for input(s): CKTOTAL, CKMB, CKMBINDEX, TROPONINI in the last 168 hours. BNP (last 3 results) No results for input(s): PROBNP in the last 8760 hours. HbA1C: No results for input(s): HGBA1C in the last 72 hours. CBG:  Recent Labs Lab 09/20/16 2008 09/21/16 0023 09/21/16 0135 09/21/16 0350 09/21/16 0855  GLUCAP 70 66 72 78  77   Lipid Profile: No results for input(s): CHOL, HDL, LDLCALC, TRIG, CHOLHDL, LDLDIRECT in the last 72 hours. Thyroid Function Tests: No results for input(s): TSH, T4TOTAL, FREET4, T3FREE, THYROIDAB in the last 72 hours. Anemia Panel: No results for input(s): VITAMINB12, FOLATE, FERRITIN, TIBC, IRON, RETICCTPCT in the last 72 hours. Urine analysis:    Component Value Date/Time   COLORURINE BROWN ([name]) 09/11/2016 2305   APPEARANCEUR TURBID ([name]) 09/11/2016 2305   LABSPEC 1.015 09/11/2016 2305   PHURINE 5.0 09/11/2016 2305   GLUCOSEU 50 ([name]) 09/11/2016 2305   HGBUR MODERATE ([name]) 09/11/2016 2305   BILIRUBINUR NEGATIVE 09/11/2016 2305   KETONESUR NEGATIVE 09/11/2016 2305   PROTEINUR 100 ([name]) 09/11/2016 2305   UROBILINOGEN 1.0 08/20/2014 1130   NITRITE NEGATIVE 09/11/2016 2305   LEUKOCYTESUR SMALL ([name]) 09/11/2016 2305   Sepsis Labs: @LABRCNTIP (procalcitonin:4,lacticidven:4)  Urine culture     Status: Abnormal   Collection Time: 09/11/16 11:05 PM  Result Value Ref Range Status   Specimen Description URINE, RANDOM  Final   Special Requests NONE  Final   Culture ([name])  Final    >=100,000 COLONIES/mL VANCOMYCIN RESISTANT ENTEROCOCCUS   Report Status 09/14/2016 FINAL  Final    Organism ID, Bacteria VANCOMYCIN RESISTANT ENTEROCOCCUS ([name])  Final      Susceptibility   Vancomycin resistant enterococcus - MIC*    AMPICILLIN >=32 RESISTANT Resistant     LEVOFLOXACIN >=8 RESISTANT Resistant     NITROFURANTOIN 256 RESISTANT Resistant     VANCOMYCIN >=32 RESISTANT Resistant     LINEZOLID 2 SENSITIVE Sensitive     * >=100,000 COLONIES/mL VANCOMYCIN RESISTANT ENTEROCOCCUS  Blood culture (routine x 2)     Status: Abnormal   Collection Time: 09/12/16  1:05 AM  Result Value Ref Range Status   Specimen Description BLOOD LEFT ARM  Final   Special Requests BOTTLES DRAWN AEROBIC AND ANAEROBIC  Final   Culture  Setup Time   Final   Culture ENTEROCOCCUS FAECALIS ([name])  Final   Report Status 09/15/2016 FINAL  Final      Susceptibility   Enterococcus faecalis - MIC*    AMPICILLIN <=2 SENSITIVE Sensitive     VANCOMYCIN 1 SENSITIVE Sensitive     GENTAMICIN SYNERGY RESISTANT Resistant     * ENTEROCOCCUS FAECALIS  Blood Culture ID Panel (Reflexed)     Status: None   Collection Time: 09/12/16  1:05 AM  Result Value Ref Range Status   Enterococcus species NOT DETECTED NOT DETECTED Final   Vancomycin resistance NOT DETECTED NOT DETECTED Final   Listeria monocytogenes NOT DETECTED NOT DETECTED Final   Staphylococcus species NOT DETECTED NOT DETECTED Final   Staphylococcus aureus NOT DETECTED NOT DETECTED Final   Streptococcus species NOT DETECTED NOT DETECTED Final   Streptococcus agalactiae NOT DETECTED NOT DETECTED Final   Streptococcus pneumoniae NOT DETECTED NOT DETECTED Final   Streptococcus pyogenes NOT DETECTED NOT DETECTED Final   Acinetobacter baumannii NOT DETECTED NOT DETECTED Final   Enterobacteriaceae species NOT DETECTED NOT DETECTED Final   Enterobacter cloacae complex NOT DETECTED NOT DETECTED Final   Escherichia coli NOT DETECTED NOT DETECTED Final   Klebsiella oxytoca NOT DETECTED NOT DETECTED Final   Klebsiella pneumoniae NOT DETECTED NOT DETECTED Final    Proteus species NOT DETECTED NOT DETECTED Final   Serratia marcescens NOT DETECTED NOT DETECTED Final   Haemophilus influenzae NOT DETECTED NOT DETECTED Final   Neisseria meningitidis NOT DETECTED NOT DETECTED Final   Pseudomonas aeruginosa NOT DETECTED NOT DETECTED Final  Candida albicans NOT DETECTED NOT DETECTED Final   Candida glabrata NOT DETECTED NOT DETECTED Final   Candida krusei NOT DETECTED NOT DETECTED Final   Candida parapsilosis NOT DETECTED NOT DETECTED Final   Candida tropicalis NOT DETECTED NOT DETECTED Final  Blood culture (routine x 2)     Status: None (Preliminary result)   Collection Time: 09/12/16  5:55 AM  Result Value Ref Range Status   Specimen Description BLOOD RIGHT ARM  Final   Special Requests IN PEDIATRIC BOTTLE  2CC  Final   Culture NO GROWTH 4 DAYS  Final   Report Status PENDING  Incomplete      Radiology Studies: Ct Head Wo Contrast Result Date: 09/16/2016 No acute abnormality. Stable atrophy and chronic small vessel white matter ischemic changes.    Marland Kitchen acetaminophen  650 mg Oral TID  . atorvastatin  10 mg Oral q1800  . budesonide  1 mg Nebulization BID  . buPROPion  150 mg Oral Daily  . cholecalciferol  1,000 Units Oral Daily  . collagenase   Topical Daily  . diclofenac sodium  2 g Topical QID  . feeding supplement (ENSURE ENLIVE)  237 mL Oral BID BM  . ferrous sulfate  325 mg Oral TID WC  . linezolid  600 mg Oral Q12H  . metoprolol succinate  50 mg Oral Daily  . multivitamin with minerals  1 tablet Oral Daily  . nutrition supplement (JUVEN)  1 packet Oral BID WC  . oxybutynin  5 mg Oral BID  . pantoprazole  40 mg Oral Daily    Continuous Infusions: . dextrose 0.639 mg/kg/min (09/21/16 0313)  . dextrose 5 % and 0.45% NaCl Stopped (09/21/16 0317)     LOS: 8 days    Time spent: 15 minutes  Greater than 50% of the time spent on counseling and coordinating the care.   Manson Passey, MD [facility] Pager [phone]  If  7PM-7AM, please contact night-coverage [email or web address] Password [email or web address] 09/21/2016, 11:07 AM

## 2016-09-21 NOTE — Progress Notes (Signed)
Hypoglycemic Event  CBG: 66  Treatment: 8 oz of orange juice Symptoms: Pt was asymptomatic  Follow-up CBG: Time:0135  CBG Result:72  Possible Reasons for Event: Not eating well.  Comments/MD notified: Attending gave verbal order for D10 @ 40 ml/hr    Georga HackingMorton, Solita Macadam

## 2016-09-22 LAB — GLUCOSE, CAPILLARY
GLUCOSE-CAPILLARY: 105 mg/dL — AB (ref 65–99)
GLUCOSE-CAPILLARY: 72 mg/dL (ref 65–99)
GLUCOSE-CAPILLARY: 75 mg/dL (ref 65–99)
Glucose-Capillary: 68 mg/dL (ref 65–99)
Glucose-Capillary: 67 mg/dL (ref 65–99)
Glucose-Capillary: 86 mg/dL (ref 65–99)
Glucose-Capillary: 97 mg/dL (ref 65–99)
Glucose-Capillary: 84 mg/dL (ref 65–99)
Glucose-Capillary: 96 mg/dL (ref 65–99)

## 2016-09-22 LAB — CBC
HCT: 25.1 % — ABNORMAL LOW (ref 36.0–46.0)
Hemoglobin: 8.1 g/dL — ABNORMAL LOW (ref 12.0–15.0)
MCH: 30.1 pg (ref 26.0–34.0)
MCHC: 32.3 g/dL (ref 30.0–36.0)
MCV: 93.3 fL (ref 78.0–100.0)
Platelets: 71 K/uL — ABNORMAL LOW (ref 150–400)
RBC: 2.69 MIL/uL — ABNORMAL LOW (ref 3.87–5.11)
RDW: 24.3 % — ABNORMAL HIGH (ref 11.5–15.5)
WBC: 7.2 K/uL (ref 4.0–10.5)

## 2016-09-22 LAB — BASIC METABOLIC PANEL WITH GFR
Anion gap: 6 (ref 5–15)
BUN: 19 mg/dL (ref 6–20)
CO2: 19 mmol/L — ABNORMAL LOW (ref 22–32)
Calcium: 8.1 mg/dL — ABNORMAL LOW (ref 8.9–10.3)
Chloride: 106 mmol/L (ref 101–111)
Creatinine, Ser: 1.32 mg/dL — ABNORMAL HIGH (ref 0.44–1.00)
GFR calc Af Amer: 44 mL/min — ABNORMAL LOW
GFR calc non Af Amer: 38 mL/min — ABNORMAL LOW
Glucose, Bld: 108 mg/dL — ABNORMAL HIGH (ref 65–99)
Potassium: 3.8 mmol/L (ref 3.5–5.1)
Sodium: 131 mmol/L — ABNORMAL LOW (ref 135–145)

## 2016-09-22 MED ORDER — DEXTROSE-NACL 5-0.45 % IV SOLN
INTRAVENOUS | Status: DC
  Administered 2016-09-22 – 2016-09-24 (×2): via INTRAVENOUS
  Filled 2016-09-22: qty 1000

## 2016-09-22 MED ORDER — MENTHOL 3 MG MT LOZG
1.0000 | LOZENGE | OROMUCOSAL | Status: DC | PRN
  Filled 2016-09-22: qty 9

## 2016-09-22 NOTE — Progress Notes (Signed)
Blood sugar was 68.  Asymptomatic.  Refused orange juice and crackers. With more encouragement finally drink 4oz of cranberry juice with ice and 4oz apple sauce. Recheck blood sugar 67. With even more encouragement drink 4oz of orange juice, and recheck blood sugar 86.

## 2016-09-22 NOTE — Progress Notes (Signed)
Patient ID: Lisa [name], female   DOB: May 17, 1941, 76 y.o.   MRN: [id number]  PROGRESS NOTE    Lisa [name]  WUJ:[id number] DOB: Jul 05, 1941 DOA: 09/11/2016  PCP: Laurena Slimmer, MD   Brief Narrative:  76 year old female with history of diabetes mellitus, chronic diastolic CHF, CAD status post CABG, paroxysmal A. fib on eliquis, (s/p cardioversion in 07/2015) OSA on CPAP, hypertension, hyperlipidemia, left femur fracture status post ORIF in 08/05/2016, hydronephrosis requiring cystoscopy with retrograde pyelogram and ureteral stent placement during the same hospitalization. Pt was admitted 2/17 for acute on chronic anemia and received 1 unit PRBC, hemoccult was negative but she did have gross hematuria for several weeks. Patient was in SNF where she was found to be confused with having stroke-like symptoms. CBG was in 40's and she was given D50 but her altered mental status did not improve.   In the ED she was hemodynamically stable. Blood work was notable for hemoglobin of 7.1 with.  UA again showed frank hematuria. She was found to have VRE on urine culture for which she is on Linezolid. Also, one of the blood cx grew enterococcus faecalis while other blood cx showed no growth.  Pt is still not at her baseline, her mental status fluctuates and she is still confused. I do not believe she is ready for discharge as we still have to monitor her platelet count which is steadily dropping even after stopping anticoagulation.   Assessment & Plan:   Acute metabolic encephalopathy - Continues to be confused, altered  - CT head with no acute findings - EEG 3/12 showed diffuse cerebral dysfunction that is non-specific in etiology and can be seen with hypoxic/ischemic injury, toxic/metabolic encephalopathies, neurodegenerative disorders, or medication effect.  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  - Continue Linezolid, total 10 days, started 09/13/16  Enterococcus faecalis bacteremia - Continue linezolid  - One blood cx grew enterococcus faecalis but second blood cx showed no growth   Chronic atrial fibrillation - CHADS vasc score 4 - Apixaban stopped at this point due to risk of bleeding from thrombocytopenia   Chronic kidney disease stage 4 - Baseline Cr in 07/2016 was 2.72 - Cr is within baseline range   Anemia of chronic disease / IDA - Hgb 7.6 this am - Transfused 1 U PRBC on 3/17 - Follow up CBC in am  Thrombocytopenia - Continue to monitor platelet count daily - Platelets are steadily dropping even after holding anticoagulation  - Monitor for signs of bleeding   Dyslipidemia  - Continue Lipitor   Adult failure to thrive / Severe protein calorie malnutrition  - In the context of acute on chronic illness - Seen by SLP - diet regular   Essential hypertension - Continue metoprolol daily   Multiple skin issues, deep tissue injury to left heel, left calf ulceration  - Deep tissue injury: left heel; 3cm x 3cm x 0cm - Full thickness lateral left calf:  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 Change daily. Hydrogel for moist wound healing to the left medial calf wound, cover with dry dressing. Change daily. Silicone foam  heel dressing to the left heel, continue use of heel protector from facility. Low air loss mattress for pressure redistribution.   DVT prophylaxis: resume apixaban  Code Status: full code  Family Communication: no family at the bedside this am; updated family 3/17 Disposition Plan: plan to D/C Monday or Tuesday if platelets are stable    Consultants:    SLP  WOC  Nutrition   PT  Palliative   GU  Neurology  ID  Procedures:   EEG 09/16/2016 - diffuse cerebral dysfunction that is non-specific in etiology and can be seen with hypoxic/ischemic injury, toxic/metabolic encephalopathies, neurodegenerative disorders, or medication effect.   Antimicrobials:   Linezolid -->   Subjective: No overnight events.   Objective: Vitals:   09/21/16 2157 09/22/16 0124 09/22/16 0508 09/22/16 1112  BP: 137/76 96/64 (!) 123/58 (!) 127/58  Pulse: 73 71 78 80  Resp: 20 20    Temp: 98.4 F (36.9 C) 97.6 F (36.4 C) 97.4 F (36.3 C)   TempSrc: Axillary Oral Oral Oral  SpO2: 100% 98% 100% 100%  Weight:      Height:        Intake/Output Summary (Last 24 hours) at 09/22/16 1149 Last data filed at 09/21/16 2157  Gross per 24 hour  Intake                0 ml  Output              450 ml  Net             -450 ml   Filed Weights   09/12/16 0430  Weight: 104.3 kg (229 lb 15 oz)    Examination:  General exam: disoriented  Respiratory system: no wheezing, no rhonchi, bilateral air entry  Cardiovascular system: S1 & S2 heard, Rate controlled  Gastrointestinal system: (+ BS, no distention  Central nervous system: confused, slightly restless  Skin: she has deep tissue injury to left heel, ulcer on lateral and medical left calf Psychiatry: little restless but no agitation   Data Reviewed: I have personally reviewed following labs and imaging studies  CBC:  Recent Labs Lab 09/18/16 0614 09/19/16 0401 09/20/16 0252 09/21/16 0450 09/22/16 0357  WBC 8.6 7.0 6.7 6.7 7.2  HGB 7.7* 8.1* 7.8* 7.6* 8.1*  HCT 23.6* 25.7* 24.6* 23.3* 25.1*  MCV 92.2 93.5 94.3 93.2 93.3  PLT 92* 92* 79* 78* 71*   Basic Metabolic Panel:  Recent Labs Lab 09/16/16 1051 09/18/16 0614 09/19/16 0401 09/20/16 0252 09/22/16 0357  NA 134* 134* 133* 136 131*  K 3.8 5.0 3.7 3.7 3.8  CL 109 110 109 111 106  CO2 19* 16* 18* 21* 19*  GLUCOSE 77 108* 79  84 108*  BUN 19 16 15 16 19   CREATININE 1.19* 1.08* 1.11* 1.17* 1.32*  CALCIUM 8.4* 8.4* 8.3* 8.2* 8.1*   GFR: Estimated Creatinine Clearance: 42.8 mL/min (A) (by C-G formula based on SCr of 1.32 mg/dL (H)). Liver Function Tests: No results for input(s): AST, ALT, ALKPHOS, BILITOT, PROT, ALBUMIN in the last 168 hours. No results for input(s): LIPASE, AMYLASE in the last 168 hours.  Recent Labs Lab 09/16/16 1051  AMMONIA 32   Coagulation Profile: No results for input(s): INR, PROTIME in the last 168 hours. Cardiac Enzymes: No results for input(s): CKTOTAL, CKMB, CKMBINDEX, TROPONINI in the last 168 hours. BNP (last 3 results) No results for input(s): PROBNP in the last 8760 hours. HbA1C: No results for input(s): HGBA1C in the last 72  hours. CBG:  Recent Labs Lab 09/22/16 0150 09/22/16 0211 09/22/16 0505 09/22/16 0755 09/22/16 1128  GLUCAP 67 86 105* 97 84   Lipid Profile: No results for input(s): CHOL, HDL, LDLCALC, TRIG, CHOLHDL, LDLDIRECT in the last 72 hours. Thyroid Function Tests: No results for input(s): TSH, T4TOTAL, FREET4, T3FREE, THYROIDAB in the last 72 hours. Anemia Panel: No results for input(s): VITAMINB12, FOLATE, FERRITIN, TIBC, IRON, RETICCTPCT in the last 72 hours. Urine analysis:    Component Value Date/Time   COLORURINE BROWN (A) 09/11/2016 2305   APPEARANCEUR TURBID (A) 09/11/2016 2305   LABSPEC 1.015 09/11/2016 2305   PHURINE 5.0 09/11/2016 2305   GLUCOSEU 50 (A) 09/11/2016 2305   HGBUR MODERATE (A) 09/11/2016 2305   BILIRUBINUR NEGATIVE 09/11/2016 2305   KETONESUR NEGATIVE 09/11/2016 2305   PROTEINUR 100 (A) 09/11/2016 2305   UROBILINOGEN 1.0 08/20/2014 1130   NITRITE NEGATIVE 09/11/2016 2305   LEUKOCYTESUR SMALL (A) 09/11/2016 2305   Sepsis Labs: @LABRCNTIP (procalcitonin:4,lacticidven:4)  Urine culture     Status: Abnormal   Collection Time: 09/11/16 11:05 PM  Result Value Ref Range Status   Specimen Description URINE, RANDOM   Final   Special Requests NONE  Final   Culture (A)  Final    >=100,000 COLONIES/mL VANCOMYCIN RESISTANT ENTEROCOCCUS   Report Status 09/14/2016 FINAL  Final   Organism ID, Bacteria VANCOMYCIN RESISTANT ENTEROCOCCUS (A)  Final      Susceptibility   Vancomycin resistant enterococcus - MIC*    AMPICILLIN >=32 RESISTANT Resistant     LEVOFLOXACIN >=8 RESISTANT Resistant     NITROFURANTOIN 256 RESISTANT Resistant     VANCOMYCIN >=32 RESISTANT Resistant     LINEZOLID 2 SENSITIVE Sensitive     * >=100,000 COLONIES/mL VANCOMYCIN RESISTANT ENTEROCOCCUS  Blood culture (routine x 2)     Status: Abnormal   Collection Time: 09/12/16  1:05 AM  Result Value Ref Range Status   Specimen Description BLOOD LEFT ARM  Final   Special Requests BOTTLES DRAWN AEROBIC AND ANAEROBIC 5ML  Final   Culture  Setup Time   Final   Culture ENTEROCOCCUS FAECALIS (A)  Final   Report Status 09/15/2016 FINAL  Final      Susceptibility   Enterococcus faecalis - MIC*    AMPICILLIN <=2 SENSITIVE Sensitive     VANCOMYCIN 1 SENSITIVE Sensitive     GENTAMICIN SYNERGY RESISTANT Resistant     * ENTEROCOCCUS FAECALIS  Blood Culture ID Panel (Reflexed)     Status: None   Collection Time: 09/12/16  1:05 AM  Result Value Ref Range Status   Enterococcus species NOT DETECTED NOT DETECTED Final   Vancomycin resistance NOT DETECTED NOT DETECTED Final   Listeria monocytogenes NOT DETECTED NOT DETECTED Final   Staphylococcus species NOT DETECTED NOT DETECTED Final   Staphylococcus aureus NOT DETECTED NOT DETECTED Final   Streptococcus species NOT DETECTED NOT DETECTED Final   Streptococcus agalactiae NOT DETECTED NOT DETECTED Final   Streptococcus pneumoniae NOT DETECTED NOT DETECTED Final   Streptococcus pyogenes NOT DETECTED NOT DETECTED Final   Acinetobacter baumannii NOT DETECTED NOT DETECTED Final   Enterobacteriaceae species NOT DETECTED NOT DETECTED Final   Enterobacter cloacae complex NOT DETECTED NOT DETECTED Final    Escherichia coli NOT DETECTED NOT DETECTED Final   Klebsiella oxytoca NOT DETECTED NOT DETECTED Final   Klebsiella pneumoniae NOT DETECTED NOT DETECTED Final   Proteus species NOT DETECTED NOT DETECTED Final   Serratia marcescens NOT DETECTED NOT DETECTED Final   Haemophilus influenzae NOT  DETECTED NOT DETECTED Final   Neisseria meningitidis NOT DETECTED NOT DETECTED Final   Pseudomonas aeruginosa NOT DETECTED NOT DETECTED Final   Candida albicans NOT DETECTED NOT DETECTED Final   Candida glabrata NOT DETECTED NOT DETECTED Final   Candida krusei NOT DETECTED NOT DETECTED Final   Candida parapsilosis NOT DETECTED NOT DETECTED Final   Candida tropicalis NOT DETECTED NOT DETECTED Final  Blood culture (routine x 2)     Status: None (Preliminary result)   Collection Time: 09/12/16  5:55 AM  Result Value Ref Range Status   Specimen Description BLOOD RIGHT ARM  Final   Special Requests IN PEDIATRIC BOTTLE  2CC  Final   Culture NO GROWTH 4 DAYS  Final   Report Status PENDING  Incomplete      Radiology Studies: Ct Head Wo Contrast Result Date: 09/16/2016 No acute abnormality. Stable atrophy and chronic small vessel white matter ischemic changes.    Marland Kitchen acetaminophen  650 mg Oral TID  . budesonide  1 mg Nebulization BID  . buPROPion  150 mg Oral Daily  . collagenase   Topical Daily  . diclofenac sodium  2 g Topical QID  . feeding supplement (ENSURE ENLIVE)  237 mL Oral BID BM  . ferrous sulfate  325 mg Oral TID WC  . metoprolol succinate  50 mg Oral Daily  . oxybutynin  5 mg Oral BID  . pantoprazole  40 mg Oral Daily    Continuous Infusions:    LOS: 9 days    Time spent: 15 minutes  Greater than 50% of the time spent on counseling and coordinating the care.   Manson Passey, MD [facility] Pager [phone]  If 7PM-7AM, please contact night-coverage [email or web address] Password [facility] 09/22/2016, 11:49 AM

## 2016-09-22 NOTE — Progress Notes (Signed)
Patient's daughter states that the family plans to take patient home with hospice tomorrow 09/23/16.  States they will be on the premises in the AM to sign DNR paperwork with MD.  Then, they will require time to receive home equipment (prefers [facility] for supplies) before they are able to have patient come home.  Family is aware case management will follow up with them tomorrow either in-hospital or by telephone. Agreeable. Lawson RadarHeather M Delanie Tirrell

## 2016-09-22 NOTE — Progress Notes (Signed)
Daily Progress Note   Patient Name: Lisa [name]       Date: 09/22/2016 DOB: 11-13-1940  Age: 76 y.o. MRN#: [id number] Attending Physician: Alison Murray, MD Primary Care Physician: Laurena Slimmer, MD Admit Date: 09/11/2016  Reason for Consultation/Follow-up: Establishing goals of care  Subjective: Patient was in bed this morning resting. She was lethargic and did not arouse to my voice. Noted hypoglycemic event overnight. Spoke with daughter [name] via telephone- they have decided to take patient home with Hospice. [name] requested to wait until Tuesday for discharge for them to arrange things in their home and finish grieving their loss of grandson- I informed [name] that I would present this request to CM and MD, however, that decision is unfortunately not up to me and I can't make that promise. [name] verbalized understanding. Discussed code status- [name] stated she would like patient to stay full code status until discharge and then change status upon discharge and send home with out of facility DNR.   Review of Systems  Unable to perform ROS: Mental status change    Length of Stay: 9  Current Medications: Scheduled Meds:  . acetaminophen  650 mg Oral TID  . atorvastatin  10 mg Oral q1800  . budesonide  1 mg Nebulization BID  . buPROPion  150 mg Oral Daily  . cholecalciferol  1,000 Units Oral Daily  . collagenase   Topical Daily  . diclofenac sodium  2 g Topical QID  . feeding supplement (ENSURE ENLIVE)  237 mL Oral BID BM  . ferrous sulfate  325 mg Oral TID WC  . metoprolol succinate  50 mg Oral Daily  . multivitamin with minerals  1 tablet Oral Daily  . nutrition supplement (JUVEN)  1 packet Oral BID WC  . oxybutynin  5 mg Oral BID  . pantoprazole  40 mg Oral Daily     Continuous Infusions: . dextrose 0.639 mg/kg/min (09/21/16 0313)  . dextrose 5 % and 0.45% NaCl 75 mL/hr at 09/22/16 0135    PRN Meds: diphenhydrAMINE, ipratropium-albuterol, magic mouthwash, polyethylene glycol  Physical Exam  Constitutional: She appears well-developed and well-nourished.  HENT:  Poor dentition  Cardiovascular: Normal rate.   Abdominal: Soft. Bowel sounds are normal.  Genitourinary:  Genitourinary Comments: Foley in place, hematuria  Neurological:  lethargic  Vital Signs: BP (!) 123/58 (BP Location: Right Arm)   Pulse 78   Temp 97.4 F (36.3 C) (Oral)   Resp 20   Ht 5\' 5"  (1.651 m)   Wt 104.3 kg (229 lb 15 oz)   LMP  (LMP Unknown)   SpO2 100%   BMI 38.26 kg/m  SpO2: SpO2: 100 % O2 Device: O2 Device: Nasal Cannula O2 Flow Rate: O2 Flow Rate (L/min): 2 L/min  Intake/output summary:  Intake/Output Summary (Last 24 hours) at 09/22/16 1096 Last data filed at 09/21/16 2157  Gross per 24 hour  Intake                0 ml  Output              450 ml  Net             -450 ml   LBM: Last BM Date: 09/17/16 Baseline Weight: Weight: 104.3 kg (229 lb 15 oz) Most recent weight: Weight: 104.3 kg (229 lb 15 oz)       Palliative Assessment/Data: PPS: 20%    Flowsheet Rows     Most Recent Value  Intake Tab  Referral Department  Hospitalist  Unit at Time of Referral  Med/Surg Unit  Palliative Care Primary Diagnosis  Neurology  Date Notified  09/17/16  Palliative Care Type  Return patient Palliative Care  Reason for referral  Clarify Goals of Care  Date of Admission  09/11/16  # of days IP prior to Palliative referral  6  Clinical Assessment  Psychosocial & Spiritual Assessment  Palliative Care Outcomes      Patient Active Problem List   Diagnosis Date Noted  . Palliative care by specialist   . Advance care planning   . Benign essential HTN 09/18/2016  . VRE (vancomycin-resistant Enterococci) infection 09/18/2016  . Enterococcus  faecalis infection 09/18/2016  . CKD (chronic kidney disease) stage 4, GFR 15-29 ml/min (HCC) 09/18/2016  . Anemia of chronic disease 09/18/2016  . Thrombocytopenia (HCC) 09/18/2016  . Adult failure to thrive 09/18/2016  . Pressure injury of skin 09/18/2016  . Acute encephalopathy   . Chronic anticoagulation 06/07/2016  . Chronic atrial fibrillation [facility])     Palliative Care Assessment & Plan   Patient Profile: 76 y.o.femalewith past medical history of DM, CHF, CAD (s/p CABG) A.Fib, HTN, hyperlipidemia, left femur fx s/p ORIF 08/05/16, hydronephrosis w/ cystoscopy and uretal stent placementadmitted on 3/7/2018with altered mental status. Workup revealed CBG hypoglycemia, but mental status did not improve with resolving blood sugar. She also was found to have VRE and frank hematuria, as well as entercoccus faecalis in blood culture. During admission she has had prolonged encephalopathy, EEG shows diffuse cerebral dysfunction of non-specific etiology. She had decreased po intake and was not taking po medications. Palliative medicine consulted for GOC.   Assessment/Recommendations/Plan   Case manager referral- home with Hospice  DNR at discharge   Goals of Care and Additional Recommendations:  Limitations on Scope of Treatment: Avoid Hospitalization, Minimize Medications, Initiate Comfort Feeding, No Artificial Feeding and No Glucose Monitoring  Code Status:  Full code until discharge at which time patient will become DNR will place out of facility DNR on chart  Prognosis:   < 6 months d/t multiple medical problems with 5 hospital admissions in the last 6 months- FTT (albumin 1.8), hip fx 07/2016, sig decreased functional status over last year, CHF, diabetes, chronic anemia and hematuria, CKD stage IV, PPS 20%, plan to transition to comfort  measures only   Discharge Planning:  Home with Hospice  Care plan was discussed with patient's daughter, Janine LimboCheryl Barnes.  Thank you for  allowing the Palliative Medicine Team to assist in the care of this patient.   Total time: 40 minutes  Greater than 50%  of this time was spent counseling and coordinating care related to the above assessment and plan.  Ocie BobKasie Teryn Gust, AGNP-C Palliative Medicine   Please contact Palliative Medicine Team phone at [phone] for questions and concerns.

## 2016-09-23 ENCOUNTER — Inpatient Hospital Stay (HOSPITAL_COMMUNITY): Payer: Medicare Other

## 2016-09-23 LAB — BASIC METABOLIC PANEL
Anion gap: 8 (ref 5–15)
BUN: 17 mg/dL (ref 6–20)
CALCIUM: 8 mg/dL — AB (ref 8.9–10.3)
CO2: 16 mmol/L — AB (ref 22–32)
CREATININE: 1.21 mg/dL — AB (ref 0.44–1.00)
Chloride: 104 mmol/L (ref 101–111)
GFR calc Af Amer: 49 mL/min — ABNORMAL LOW (ref >60)
GFR calc non Af Amer: 42 mL/min — ABNORMAL LOW (ref >60)
GLUCOSE: 76 mg/dL (ref 65–99)
Potassium: 3.9 mmol/L (ref 3.5–5.1)
Sodium: 128 mmol/L — ABNORMAL LOW (ref 135–145)

## 2016-09-23 LAB — CBC
HEMATOCRIT: 22.8 % — AB (ref 36.0–46.0)
HEMOGLOBIN: 7.5 g/dL — AB (ref 12.0–15.0)
MCH: 30.6 pg (ref 26.0–34.0)
MCHC: 32.9 g/dL (ref 30.0–36.0)
MCV: 93.1 fL (ref 78.0–100.0)
Platelets: 70 10*3/uL — ABNORMAL LOW (ref 150–400)
RBC: 2.45 MIL/uL — ABNORMAL LOW (ref 3.87–5.11)
RDW: 23.9 % — ABNORMAL HIGH (ref 11.5–15.5)
WBC: 7.3 10*3/uL (ref 4.0–10.5)

## 2016-09-23 LAB — GLUCOSE, CAPILLARY
GLUCOSE-CAPILLARY: 80 mg/dL (ref 65–99)
Glucose-Capillary: 65 mg/dL (ref 65–99)
Glucose-Capillary: 69 mg/dL (ref 65–99)
Glucose-Capillary: 73 mg/dL (ref 65–99)
Glucose-Capillary: 65 mg/dL (ref 65–99)
Glucose-Capillary: 80 mg/dL (ref 65–99)
Glucose-Capillary: 83 mg/dL (ref 65–99)
Glucose-Capillary: 81 mg/dL (ref 65–99)

## 2016-09-23 LAB — BLOOD GAS, ARTERIAL
Acid-base deficit: 7.5 mmol/L — ABNORMAL HIGH (ref 0.0–2.0)
BICARBONATE: 17.2 mmol/L — AB (ref 20.0–28.0)
Drawn by: 280981
O2 CONTENT: 4 L/min
O2 SAT: 99.3 %
PATIENT TEMPERATURE: 98.6
PO2 ART: 147 mmHg — AB (ref 83.0–108.0)
pCO2 arterial: 33.6 mmHg (ref 32.0–48.0)
pH, Arterial: 7.331 — ABNORMAL LOW (ref 7.350–7.450)

## 2016-09-23 LAB — PREPARE RBC (CROSSMATCH)

## 2016-09-23 MED ORDER — SODIUM CHLORIDE 0.9 % IV BOLUS (SEPSIS)
500.0000 mL | Freq: Once | INTRAVENOUS | Status: AC
  Administered 2016-09-23: 500 mL via INTRAVENOUS

## 2016-09-23 MED ORDER — SODIUM CHLORIDE 0.9 % IV SOLN: Freq: Once | INTRAVENOUS | Status: DC

## 2016-09-23 MED ORDER — DEXTROSE 50 % IV SOLN
INTRAVENOUS | Status: AC
  Administered 2016-09-23: 25 mL
  Filled 2016-09-23: qty 50

## 2016-09-23 NOTE — Progress Notes (Signed)
Blood infusion started. Paused D5 1/2 NS infusion, IV team to start new IV for infusion. RN at bedside.

## 2016-09-23 NOTE — Progress Notes (Signed)
No charge note:  Spoke with Tresa EndoKelly, Charity fundraiserN. Discussed GOC for patient are to optimize medically and then discharge home with  Hospice at which time her GOC will be comfort measures only. Tresa EndoKelly concerned re: hypoglycemic episodes. Will restart CBG's until discharge.   Ocie BobKasie Mahan, AGNP-C Palliative Medicine  Please call Palliative Medicine team phone with any questions [phone]. For individual providers please see AMION.

## 2016-09-23 NOTE — Progress Notes (Signed)
Resumed IVF at 1975ml/hr per order into right wrist IV

## 2016-09-23 NOTE — Progress Notes (Signed)
Patient drowsy, falls to sleep when not being spoken to, states "where is my cat?" Patient also more irritable with staff, will not lift arm for staff to obtain blood pressure. SaO2 is 78%. Patient pulling off nasal canula throughout shift. Patient's SaO2 96 on 4L [place]. MD notified. CXR, ABG ordered. Palliative care notified.

## 2016-09-23 NOTE — Progress Notes (Signed)
[facility] RN visit  Hospital Liaisons notified by Fabio NeighborsKelli Willard [facility]RN of patient request for [facility] at [facility] after discharge.  Chart and patient information reviewed and awaiting confirmation of eligibilty from Dr. Gibson RampFeldman, [facility] MD.   Sherron MondaySpoke with daughter Janine LimboCheryl Barnes on telephone to confirm interest, initiate education related to hospice philosophy, services and team approach to care. Family verbalized understanding with information provided and interested in patient going home with hospice with possible discharge tomorrow.  Please send signed completed (out of facility) DNR form home with patient. Pt will need prescriptions for discharge comfort medications if needed.  DME needs discussed and family request the following DME for delivery to the home: Hospital bed (4 full rails) with over the bed bedside table, O2 setup with tank, and may request a hoyer lift in future.  [facility] equipment manager Jewel Kizzie BaneHughes notified and will contact [facility] to arrange delivery to the home once eligibiltiy confirmed.  The home address has been verified and is correct in the chart;  Janine LimboCheryl Barnes in the family member to be contacted to arrange time of delivery.    [facility] Referral Center aware of the above. Completed d/c summary will need to be [facility] at [phone] when final. Please notify [facility] when pt is ready to leave unit at discharge -- call [phone] (or [phone] after 5pm) [facility] information and contact information left at bedside for family per request.  Above information shared with [facility] Fabio NeighborsKelli Willard.  [facility] Liaison will contact family and CM RN in morning to confirm eligibility.     Please call with any questions.  Roda ShuttersSherry Gibson, RN [facility] Hospital Liaison  [phone]

## 2016-09-23 NOTE — Progress Notes (Addendum)
Per palliative, no cbg checks. Aware of recent cbg results

## 2016-09-23 NOTE — Progress Notes (Signed)
Hypoglycemic Event  CBG: 65  Treatment: D50 IV 25 mL  Symptoms: Nervous/irritable  Follow-up CBG: Time: CBG Result:80  Possible Reasons for Event: Inadequate meal intake  Comments/MD notified: NT and RN attempted to feed patient, patient refused. RN educated patient r/t hypoglycemia, attempted to feed patient, she stated "I will get to it!" Patient would not eat. RN administered 25mL D50 per MAR. Follow up cbg 80. Dr. [name] notified. No new orders at this time.  Shaylen Nephew, Claria DiceKelly E

## 2016-09-23 NOTE — Progress Notes (Signed)
Notified Dr. [name] patient's Na 128, hgb 7.5.

## 2016-09-23 NOTE — Care Management Note (Signed)
Case Management Note  Patient Details  Name: MAKIYA JEUNE MRN: [id number] Date of Birth: [birth year]  Subjective/Objective:                    Action/Plan: Patient is discharging home with hospice tomorrow. CM met with the patients daughters and they chose Hospice and [place]. Stacie with [facility] notified of referral. Daughters requesting for home: bed, oxygen, over the bed table and lift. Stacie made aware. Daughters also asking for PTAR at time of transport. CM following.   Expected Discharge Date:                  Expected Discharge Plan:  Home w Hospice Care  In-House Referral:  Clinical Social Work  Discharge planning Services  CM Consult  Post Acute Care Choice:  Durable Medical Equipment, Home Health Choice offered to:  NA, Adult Children  DME Arranged:  Hospital bed, Oxygen DME Agency:  [place]:    [place] Agency:   ([place])  Status of Service:  In process, will continue to follow  If discussed at Long Length of Stay Meetings, dates discussed:    Additional Comments:  Pollie Friar, RN 09/23/2016, 12:57 PM

## 2016-09-23 NOTE — Consult Note (Signed)
   [facility] Consult   09/23/2016  Lisa SchneidersGrace A [name] 07/05/1941 [id number]    [facility] Care [facility] follow up. Family not at bedside upon writer's visit. Chart reviewed and confirmed with inpatient RNCM that discharge plan is for home with hospice services provided by [facility]. Will continue to follow and confirm discharge and disposition. Will update [facility] team.    Raiford NobleAtika Caya Soberanis, MSN-Ed, RN,BSN [facility] Care Management Hospital Liaison [phone]

## 2016-09-23 NOTE — Progress Notes (Signed)
BS was 69. Asymptomatic. Encouraged to drink juice. Recheck blood sugar of 65. Encouraged to drink orange juice and recheck blood sugar of 73.

## 2016-09-23 NOTE — Progress Notes (Addendum)
Patient ID: Lisa [name], female   DOB: [birth year], 76 y.o.   MRN: [id number]  PROGRESS NOTE    Lisa [name]  [id number] DOB: [birth year] DOA: 09/11/2016  PCP: [name],[name], MD   Brief Narrative:  76 year old female with history of diabetes mellitus, chronic diastolic CHF, CAD status post CABG, paroxysmal A. fib on eliquis, (s/p cardioversion in 07/2015) OSA on CPAP, hypertension, hyperlipidemia, left femur fracture status post ORIF in 08/05/2016, hydronephrosis requiring cystoscopy with retrograde pyelogram and ureteral stent placement during the same hospitalization. Pt was admitted 2/17 for acute on chronic anemia and received 1 unit PRBC, hemoccult was negative but she did have gross hematuria for several weeks. Patient was in SNF where she was found to be confused with having stroke-like symptoms. CBG was in 40's and she was given D50 but her altered mental status did not improve.   In the ED she was hemodynamically stable. Blood work was notable for hemoglobin of 7.1 with.  UA again showed frank hematuria. She was found to have VRE on urine culture for which she is on Linezolid. Also, one of the blood cx grew enterococcus faecalis while other blood cx showed no growth.  Assessment & Plan:   Acute metabolic encephalopathy - Still confused  - CT head with no acute findings - EEG 3/12 showed diffuse cerebral dysfunction that is non-specific in etiology and can be seen with hypoxic/ischemic injury, toxic/metabolic encephalopathies, neurodegenerative disorders, or medication effect.  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  - Continue Linezolid, total 10 days, started 09/13/16  Enterococcus faecalis bacteremia - We will continue linezolid  - One blood cx grew enterococcus faecalis but second blood cx showed no growth   Chronic atrial fibrillation -  CHADS vasc score 4 - Apixaban stopped at this point due to risk of bleeding from thrombocytopenia   Chronic kidney disease stage 4 - Baseline Cr in 07/2016 was 2.72 - Cr remains within baseline range   Hyponatremia - Likely from poor po intake, dehydration - Will continue IV fluids - Follow up BMP in am  Anemia of chronic disease / IDA - Hgb 7.6 this am - Transfused 1 U PRBC on 3/17 - Hgb again down to 7.5 this am - Transfuse 2 Units PRBC today  - Follow up CBC in am  Thrombocytopenia - Continue to monitor platelet count daily  Dyslipidemia  - Continue Lipitor   Adult failure to thrive / Severe protein calorie malnutrition  - In the context of acute on chronic illness - Seen by SLP - diet regular   Essential hypertension - Continue metoprolol daily   Multiple skin issues, deep tissue injury to left heel, left calf ulceration  - Deep tissue injury: left heel; 3cm x 3cm x 0cm - Full thickness lateral left calf:  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 Change daily. Hydrogel for moist wound healing to the left medial calf wound, cover with dry dressing. Change daily. Silicone foam heel dressing to the left heel, continue use of heel protector from facility. Low air loss mattress for pressure redistribution.   DVT prophylaxis: resume apixaban  Code Status: full code  Family Communication: no family at the bedside this am; updated family 3/17 Disposition Plan: plan to D/C tomorrow if hgb and platelets stable    Consultants:   SLP  WOC  Nutrition   PT  Palliative   GU  Neurology  ID  Procedures:   EEG 09/16/2016 - diffuse cerebral dysfunction that is non-specific in etiology and can be seen with hypoxic/ischemic  injury, toxic/metabolic encephalopathies, neurodegenerative disorders, or medication effect.   Antimicrobials:   Linezolid -->   Subjective: No overnight events.   Objective: Vitals:   09/23/16 0515 09/23/16 0901 09/23/16 0953 09/23/16 1049  BP: (!) 122/100  (!) 123/52 (!) 120/38  Pulse:   82   Resp:   16   Temp:   97.7 F (36.5 C)   TempSrc:   Oral   SpO2:  100% 98%   Weight:      Height:        Intake/Output Summary (Last 24 hours) at 09/23/16 1150 Last data filed at 09/23/16 0900  Gross per 24 hour  Intake              240 ml  Output              425 ml  Net             -185 ml   Filed Weights   09/12/16 0430  Weight: 104.3 kg (229 lb 15 oz)    Examination:  General exam: disoriented, no distress  Respiratory system: no wheezing, no rhonchi Cardiovascular system: S1 & S2 heard, RRR Gastrointestinal system: (+ BS, no distention, no tenderness  Central nervous system: nonfocal  Skin: she has deep tissue injury to left heel, ulcer on lateral and medical left calf Psychiatry: disoriented   Data Reviewed: I have personally reviewed following labs and imaging studies  CBC:  Recent Labs Lab 09/19/16 0401 09/20/16 0252 09/21/16 0450 09/22/16 0357 09/23/16 1039  WBC 7.0 6.7 6.7 7.2 7.3  HGB 8.1* 7.8* 7.6* 8.1* 7.5*  HCT 25.7* 24.6* 23.3* 25.1* 22.8*  MCV 93.5 94.3 93.2 93.3 93.1  PLT 92* 79* 78* 71* 70*   Basic Metabolic Panel:  Recent Labs Lab 09/18/16 0614 09/19/16 0401 09/20/16 0252 09/22/16 0357 09/23/16 1039  NA 134* 133* 136 131* 128*  K 5.0 3.7 3.7 3.8 3.9  CL 110 109 111 106 104  CO2 16* 18* 21* 19* 16*  GLUCOSE 108* 79 84 108* 76  BUN 16 15 16 19 17   CREATININE 1.08* 1.11* 1.17* 1.32* 1.21*  CALCIUM 8.4* 8.3* 8.2* 8.1* 8.0*   GFR: Estimated Creatinine Clearance: 46.7 mL/min (A) (by C-G formula based on SCr of 1.21 mg/dL (H)). Liver Function Tests: No results for input(s): AST, ALT, ALKPHOS, BILITOT, PROT, ALBUMIN in the last 168  hours. No results for input(s): LIPASE, AMYLASE in the last 168 hours. No results for input(s): AMMONIA in the last 168 hours. Coagulation Profile: No results for input(s): INR, PROTIME in the last 168 hours. Cardiac Enzymes: No results for input(s): CKTOTAL, CKMB, CKMBINDEX, TROPONINI in the last 168 hours. BNP (last 3 results) No results for input(s): PROBNP in the last 8760 hours. HbA1C: No results for input(s): HGBA1C in the last 72 hours. CBG:  Recent Labs Lab 09/22/16 2345 09/23/16 0433 09/23/16 0511 09/23/16 0538 09/23/16 0838  GLUCAP 72 69 65 73 80   Lipid Profile: No results for input(s): CHOL, HDL, LDLCALC, TRIG, CHOLHDL, LDLDIRECT in the last 72 hours. Thyroid Function  Tests: No results for input(s): TSH, T4TOTAL, FREET4, T3FREE, THYROIDAB in the last 72 hours. Anemia Panel: No results for input(s): VITAMINB12, FOLATE, FERRITIN, TIBC, IRON, RETICCTPCT in the last 72 hours. Urine analysis:    Component Value Date/Time   COLORURINE BROWN (A) 09/11/2016 2305   APPEARANCEUR TURBID (A) 09/11/2016 2305   LABSPEC 1.015 09/11/2016 2305   PHURINE 5.0 09/11/2016 2305   GLUCOSEU 50 (A) 09/11/2016 2305   HGBUR MODERATE (A) 09/11/2016 2305   BILIRUBINUR NEGATIVE 09/11/2016 2305   KETONESUR NEGATIVE 09/11/2016 2305   PROTEINUR 100 (A) 09/11/2016 2305   UROBILINOGEN 1.0 08/20/2014 1130   NITRITE NEGATIVE 09/11/2016 2305   LEUKOCYTESUR SMALL (A) 09/11/2016 2305   Sepsis Labs: @LABRCNTIP (procalcitonin:4,lacticidven:4)  Urine culture     Status: Abnormal   Collection Time: 09/11/16 11:05 PM  Result Value Ref Range Status   Specimen Description URINE, RANDOM  Final   Special Requests NONE  Final   Culture (A)  Final    >=100,000 COLONIES/mL VANCOMYCIN RESISTANT ENTEROCOCCUS   Report Status 09/14/2016 FINAL  Final   Organism ID, Bacteria VANCOMYCIN RESISTANT ENTEROCOCCUS (A)  Final      Susceptibility   Vancomycin resistant enterococcus - MIC*    AMPICILLIN >=32  RESISTANT Resistant     LEVOFLOXACIN >=8 RESISTANT Resistant     NITROFURANTOIN 256 RESISTANT Resistant     VANCOMYCIN >=32 RESISTANT Resistant     LINEZOLID 2 SENSITIVE Sensitive     * >=100,000 COLONIES/mL VANCOMYCIN RESISTANT ENTEROCOCCUS  Blood culture (routine x 2)     Status: Abnormal   Collection Time: 09/12/16  1:05 AM  Result Value Ref Range Status   Specimen Description BLOOD LEFT ARM  Final   Special Requests BOTTLES DRAWN AEROBIC AND ANAEROBIC  Final   Culture  Setup Time   Final   Culture ENTEROCOCCUS FAECALIS (A)  Final   Report Status 09/15/2016 FINAL  Final      Susceptibility   Enterococcus faecalis - MIC*    AMPICILLIN <=2 SENSITIVE Sensitive     VANCOMYCIN 1 SENSITIVE Sensitive     GENTAMICIN SYNERGY RESISTANT Resistant     * ENTEROCOCCUS FAECALIS  Blood Culture ID Panel (Reflexed)     Status: None   Collection Time: 09/12/16  1:05 AM  Result Value Ref Range Status   Enterococcus species NOT DETECTED NOT DETECTED Final   Vancomycin resistance NOT DETECTED NOT DETECTED Final   Listeria monocytogenes NOT DETECTED NOT DETECTED Final   Staphylococcus species NOT DETECTED NOT DETECTED Final   Staphylococcus aureus NOT DETECTED NOT DETECTED Final   Streptococcus species NOT DETECTED NOT DETECTED Final   Streptococcus agalactiae NOT DETECTED NOT DETECTED Final   Streptococcus pneumoniae NOT DETECTED NOT DETECTED Final   Streptococcus pyogenes NOT DETECTED NOT DETECTED Final   Acinetobacter baumannii NOT DETECTED NOT DETECTED Final   Enterobacteriaceae species NOT DETECTED NOT DETECTED Final   Enterobacter cloacae complex NOT DETECTED NOT DETECTED Final   Escherichia coli NOT DETECTED NOT DETECTED Final   Klebsiella oxytoca NOT DETECTED NOT DETECTED Final   Klebsiella pneumoniae NOT DETECTED NOT DETECTED Final   Proteus species NOT DETECTED NOT DETECTED Final   Serratia marcescens NOT DETECTED NOT DETECTED Final   Haemophilus influenzae NOT DETECTED NOT DETECTED  Final   Neisseria meningitidis NOT DETECTED NOT DETECTED Final   Pseudomonas aeruginosa NOT DETECTED NOT DETECTED Final   Candida albicans NOT DETECTED NOT DETECTED Final   Candida glabrata NOT DETECTED NOT DETECTED Final   Candida krusei  NOT DETECTED NOT DETECTED Final   Candida parapsilosis NOT DETECTED NOT DETECTED Final   Candida tropicalis NOT DETECTED NOT DETECTED Final  Blood culture (routine x 2)     Status: None (Preliminary result)   Collection Time: 09/12/16  5:55 AM  Result Value Ref Range Status   Specimen Description BLOOD RIGHT ARM  Final   Special Requests IN PEDIATRIC BOTTLE  2CC  Final   Culture NO GROWTH 4 DAYS  Final   Report Status PENDING  Incomplete      Radiology Studies: Ct Head Wo Contrast Result Date: 09/16/2016 No acute abnormality. Stable atrophy and chronic small vessel white matter ischemic changes.    . sodium chloride   Intravenous Once  . acetaminophen  650 mg Oral TID  . budesonide  1 mg Nebulization BID  . buPROPion  150 mg Oral Daily  . collagenase   Topical Daily  . diclofenac sodium  2 g Topical QID  . feeding supplement (ENSURE ENLIVE)  237 mL Oral BID BM  . ferrous sulfate  325 mg Oral TID WC  . metoprolol succinate  50 mg Oral Daily  . oxybutynin  5 mg Oral BID  . pantoprazole  40 mg Oral Daily    Continuous Infusions: . dextrose 5 % and 0.45% NaCl 1,000 mL infusion 50 mL/hr at 09/22/16 1622     LOS: 10 days    Time spent: 15 minutes  Greater than 50% of the time spent on counseling and coordinating the care.   Manson Passey, MD [facility] Pager [phone]  If 7PM-7AM, please contact night-coverage [email or web address] Password [email or web address] 09/23/2016, 11:50 AM

## 2016-09-23 NOTE — Progress Notes (Signed)
Dr. [name] notified of patien'ts cbg 1673 with D5 1/2 NS @50  order. Notified cbg overnight.

## 2016-09-24 ENCOUNTER — Other Ambulatory Visit: Payer: Self-pay | Admitting: *Deleted

## 2016-09-24 ENCOUNTER — Encounter: Payer: Self-pay | Admitting: Cardiology

## 2016-09-24 ENCOUNTER — Encounter: Payer: Self-pay | Admitting: *Deleted

## 2016-09-24 DIAGNOSIS — N183 Chronic kidney disease, stage 3 (moderate): Secondary | ICD-10-CM

## 2016-09-24 DIAGNOSIS — N179 Acute kidney failure, unspecified: Secondary | ICD-10-CM

## 2016-09-24 DIAGNOSIS — Z515 Encounter for palliative care: Secondary | ICD-10-CM

## 2016-09-24 LAB — BPAM RBC
Blood Product Expiration Date: 201803292359
Blood Product Expiration Date: 201803302359
ISSUE DATE / TIME: 201803191746
ISSUE DATE / TIME: 201803192306
UNIT TYPE AND RH: 6200
Unit Type and Rh: 6200

## 2016-09-24 LAB — TYPE AND SCREEN
ABO/RH(D): A POS
Antibody Screen: POSITIVE
DAT, IgG: POSITIVE
UNIT DIVISION: 0
Unit division: 0

## 2016-09-24 LAB — CBC WITH DIFFERENTIAL/PLATELET
BASOS ABS: 0 10*3/uL (ref 0.0–0.1)
Basophils Relative: 1 %
EOS PCT: 5 %
Eosinophils Absolute: 0.4 10*3/uL (ref 0.0–0.7)
HEMATOCRIT: 31.4 % — AB (ref 36.0–46.0)
Hemoglobin: 10.1 g/dL — ABNORMAL LOW (ref 12.0–15.0)
LYMPHS PCT: 31 %
Lymphs Abs: 2.3 10*3/uL (ref 0.7–4.0)
MCH: 29 pg (ref 26.0–34.0)
MCHC: 32.2 g/dL (ref 30.0–36.0)
MCV: 90.2 fL (ref 78.0–100.0)
MONO ABS: 0.7 10*3/uL (ref 0.1–1.0)
Monocytes Relative: 9 %
Neutro Abs: 4 10*3/uL (ref 1.7–7.7)
Neutrophils Relative %: 55 %
Platelets: 65 10*3/uL — ABNORMAL LOW (ref 150–400)
RBC: 3.48 MIL/uL — ABNORMAL LOW (ref 3.87–5.11)
RDW: 20.6 % — AB (ref 11.5–15.5)
WBC: 7.4 10*3/uL (ref 4.0–10.5)

## 2016-09-24 LAB — BASIC METABOLIC PANEL
Anion gap: 8 (ref 5–15)
BUN: 19 mg/dL (ref 6–20)
CO2: 16 mmol/L — ABNORMAL LOW (ref 22–32)
Calcium: 8.1 mg/dL — ABNORMAL LOW (ref 8.9–10.3)
Chloride: 106 mmol/L (ref 101–111)
Creatinine, Ser: 1.18 mg/dL — ABNORMAL HIGH (ref 0.44–1.00)
GFR calc Af Amer: 50 mL/min — ABNORMAL LOW (ref >60)
GFR calc non Af Amer: 43 mL/min — ABNORMAL LOW (ref >60)
Glucose, Bld: 84 mg/dL (ref 65–99)
Potassium: 4.1 mmol/L (ref 3.5–5.1)
Sodium: 130 mmol/L — ABNORMAL LOW (ref 135–145)

## 2016-09-24 LAB — GLUCOSE, CAPILLARY
Glucose-Capillary: 85 mg/dL (ref 65–99)
Glucose-Capillary: 71 mg/dL (ref 65–99)
Glucose-Capillary: 76 mg/dL (ref 65–99)
Glucose-Capillary: 80 mg/dL (ref 65–99)
Glucose-Capillary: 91 mg/dL (ref 65–99)

## 2016-09-24 MED ORDER — ONDANSETRON 4 MG PO TBDP: 4.0000 mg | ORAL_TABLET | Freq: Four times a day (QID) | ORAL | 0 refills | Status: AC | PRN

## 2016-09-24 MED ORDER — GLYCOPYRROLATE 0.2 MG/ML IJ SOLN: 0.2000 mg | INTRAMUSCULAR | Status: DC | PRN

## 2016-09-24 MED ORDER — ACETAMINOPHEN 650 MG RE SUPP: 650.0000 mg | Freq: Four times a day (QID) | RECTAL | Status: DC | PRN

## 2016-09-24 MED ORDER — MORPHINE SULFATE (CONCENTRATE) 10 MG/0.5ML PO SOLN: 5.0000 mg | ORAL | Status: DC | PRN

## 2016-09-24 MED ORDER — GLYCOPYRROLATE 1 MG PO TABS: 1.0000 mg | ORAL_TABLET | ORAL | Status: DC | PRN

## 2016-09-24 MED ORDER — HALOPERIDOL LACTATE 2 MG/ML PO CONC: 0.6000 mg | ORAL | 0 refills | Status: AC | PRN

## 2016-09-24 MED ORDER — HALOPERIDOL 1 MG PO TABS: 0.5000 mg | ORAL_TABLET | ORAL | Status: DC | PRN

## 2016-09-24 MED ORDER — ONDANSETRON 4 MG PO TBDP: 4.0000 mg | ORAL_TABLET | Freq: Four times a day (QID) | ORAL | Status: DC | PRN

## 2016-09-24 MED ORDER — ONDANSETRON HCL 4 MG/2ML IJ SOLN: 4.0000 mg | Freq: Four times a day (QID) | INTRAMUSCULAR | Status: DC | PRN

## 2016-09-24 MED ORDER — HALOPERIDOL LACTATE 5 MG/ML IJ SOLN: 0.5000 mg | INTRAMUSCULAR | Status: DC | PRN

## 2016-09-24 MED ORDER — ACETAMINOPHEN 325 MG PO TABS: 650.0000 mg | ORAL_TABLET | Freq: Four times a day (QID) | ORAL | Status: DC | PRN

## 2016-09-24 MED ORDER — HALOPERIDOL LACTATE 2 MG/ML PO CONC
0.5000 mg | ORAL | Status: DC | PRN
  Filled 2016-09-24: qty 0.3

## 2016-09-24 MED ORDER — MORPHINE SULFATE (CONCENTRATE) 10 MG/0.5ML PO SOLN: 5.0000 mg | ORAL | 0 refills | Status: AC | PRN

## 2016-09-24 MED ORDER — DICLOFENAC SODIUM 1 % TD GEL: 2.0000 g | Freq: Four times a day (QID) | TRANSDERMAL | 0 refills | Status: AC

## 2016-09-24 NOTE — Progress Notes (Signed)
Cleaned and put new foam bandage on left heel.

## 2016-09-24 NOTE — Progress Notes (Signed)
Started second unit of blood 2330 no reaction from patient, doing well.

## 2016-09-24 NOTE — Discharge Summary (Addendum)
Physician Discharge Summary  Lisa [name]:[id number] DOB: May 11, 1941 DOA: 09/11/2016  PCP: Laurena Slimmer, MD  Admit date: 09/11/2016 Discharge date: 09/24/2016  Recommendations for Outpatient Follow-up:  1. Continue to monitor platelet count with PCP. Apixaban on hold due to low platelet count   Discharge Diagnoses:  Principal Problem:   Acute encephalopathy Active Problems:   VRE (vancomycin-resistant Enterococci) infection   Enterococcus faecalis infection   Chronic atrial fibrillation (HCC)   Chronic anticoagulation   Benign essential HTN   CKD (chronic kidney disease) stage 4, GFR 15-29 ml/min (HCC)   Anemia of chronic disease   Thrombocytopenia (HCC)   Adult failure to thrive   Pressure injury of skin   Palliative care by specialist   Advance care planning    Discharge Condition: stable   Diet recommendation: as tolerated   History of present illness:  76 year old female with history of diabetes mellitus, chronic diastolic CHF, CAD status post CABG, paroxysmal A. fib on eliquis, (s/p cardioversion in 07/2015) OSA on CPAP, hypertension, hyperlipidemia, left femur fracture status post ORIF in 08/05/2016, hydronephrosis requiring cystoscopy with retrograde pyelogram and ureteral stent placement during the same hospitalization. Pt was admitted 2/17 for acute on chronic anemia and received 1 unit PRBC, hemoccult was negative but she did have gross hematuria for several weeks. Patient was in SNF where she was found to be confused with having stroke-like symptoms. CBG was in 40's and she was given D50 but her altered mental status did not improve.   In the ED she was hemodynamically stable. Blood work was notable for hemoglobin of 7.1 with.  UA again showed frank hematuria. She was found to have VRE on urine culture for which she is on Linezolid. Also, one of the blood cx grew enterococcus faecalis while other blood cx showed no growth.  Hospital Course:   Assessment &  Plan:   Acute metabolic encephalopathy - Still confused  - CT head with no acute findings - EEG 3/12 showed diffuse cerebral dysfunction that is non-specific in etiology and can be seen with hypoxic/ischemic injury, toxic/metabolic encephalopathies, neurodegenerative disorders, or medication effect.  - Avoid narcotics, benzos - Recent TSH and b12 normal - Has received lactulose for hyperammonemia but her ammonia on 3/12 was WNL  VRE UTI due to chronic foley catheter  - Continue Linezolid through today   Enterococcus faecalis bacteremia - We will continue linezolid  - One blood cx grew enterococcus faecalis but second blood cx showed no growth   Chronic atrial fibrillation - CHADS vasc score 4 - Apixaban stopped at this point due to risk of bleeding from thrombocytopenia   Chronic kidney disease stage 4 - Baseline Cr in 07/2016 was 2.72 - Cr remains within baseline range   Hyponatremia - Likely from poor po intake, dehydration - Sodium stable   Anemia of chronic disease / IDA - Hgb 7.6 this am - Transfused 1 U PRBC on 3/17 - Transfused 2 Units PRBC 3/19  Thrombocytopenia - Continue to monitor platelet count on outpt basis   Dyslipidemia  - Minimize pill burden so Lipitor on hold  Adult failure to thrive / Severe protein calorie malnutrition  - In the context of acute on chronic illness - Seen by SLP - diet regular   Essential hypertension - BP stable off of BP meds   Multiple skin issues, deep tissue injury to left heel, left calf ulceration  - Deep tissue injury: left heel; 3cm x 3cm x 0cm - Full thickness lateral left  calf: 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 Change daily. Hydrogel for moist wound  healing to the left medial calf wound, cover with dry dressing. Change daily. Silicone foam heel dressing to the left heel, continue use of heel protector from facility. Low air loss mattress for pressure redistribution.   DVT prophylaxis: resume apixaban  Code Status: DNR/DNI  Family Communication: no family at the bedside this am; updated family 3/17; they know and anticipate d/c 3/20   Consultants:   SLP  WOC  Nutrition   PT  Palliative   GU  Neurology  ID  Procedures:   EEG 09/16/2016 - diffuse cerebral dysfunction that is non-specific in etiology and can be seen with hypoxic/ischemic injury, toxic/metabolic encephalopathies, neurodegenerative disorders, or medication effect.   Antimicrobials:   Linezolid --> 3/20  Signed:  Manson PasseyEVINE, Dashae Wilcher, MD  [facility] 09/24/2016, 10:45 AM  Pager #: [phone]  Time spent in minutes: less than 30 minutes    Discharge Exam: Vitals:   09/24/16 0854 09/24/16 0937  BP: 128/70   Pulse: 84 76  Resp: 19 18  Temp: 97.4 F (36.3 C)    Vitals:   09/24/16 0125 09/24/16 0432 09/24/16 0854 09/24/16 0937  BP: (!) 146/90 125/61 128/70   Pulse: 75 (!) 128 84 76  Resp: 16 16 19 18   Temp: 97.4 F (36.3 C) 97.6 F (36.4 C) 97.4 F (36.3 C)   TempSrc: Oral Oral Oral   SpO2: 100% 96% 98% 98%  Weight:      Height:        General: Pt is not in acute distress Cardiovascular: Regular rate and rhythm, S1/S2 (+) Respiratory: no wheezing, no crackles, no rhonchi Abdominal: Soft, non tender, non distended, bowel sounds +, no guarding Extremities: no edema, no cyanosis, pulses palpable bilaterally DP and PT Neuro: Grossly nonfocal, disoriented   Discharge Instructions  Discharge Instructions    Call MD for:  persistant nausea and vomiting    Complete by:  As directed    Call MD for:  severe uncontrolled pain    Complete by:  As directed    Diet - low sodium heart healthy    Complete by:  As directed     Increase activity slowly    Complete by:  As directed      Allergies as of 09/24/2016   No Known Allergies     Medication List    STOP taking these medications   amLODipine 5 MG tablet Commonly known as:  NORVASC   apixaban 2.5 MG Tabs tablet Commonly known as:  ELIQUIS   atorvastatin 10 MG tablet Commonly known as:  LIPITOR   ferrous sulfate 325 (65 FE) MG tablet   furosemide 20 MG tablet Commonly known as:  LASIX   hydrALAZINE 25 MG tablet Commonly known as:  APRESOLINE   HYDROcodone-acetaminophen 5-325 MG tablet Commonly known as:  NORCO/VICODIN   INVANZ 1 g injection Generic drug:  ertapenem   isosorbide mononitrate 30 MG 24 hr tablet Commonly known as:  IMDUR   metoprolol succinate 50 MG 24 hr tablet Commonly known as:  TOPROL-XL   omeprazole 20 MG capsule Commonly known as:  PRILOSEC     TAKE these medications   acetaminophen 325 MG tablet Commonly known as:  TYLENOL Take 650 mg by mouth every 4 (four) hours as needed for moderate pain.   budesonide 0.5 MG/2ML nebulizer solution Commonly known as:  PULMICORT Take 4 mLs (1 mg total) by nebulization 2 (two) times daily.   buPROPion 150 MG 24 hr tablet Commonly known as:  WELLBUTRIN XL Take 150 mg by mouth daily. Reported on 08/02/2015   cholecalciferol 1000 units tablet Commonly known as:  VITAMIN D Take 1,000 Units by mouth daily.   collagenase ointment Commonly known as:  SANTYL Apply 1 application topically 2 (two) times daily.   diclofenac sodium 1 % Gel Commonly known as:  VOLTAREN Apply 2 g topically 4 (four) times daily.   diphenhydrAMINE 25 MG tablet Commonly known as:  BENADRYL Take 25 mg by mouth every 6 (six) hours as needed for itching.   eucerin cream Apply 1 application topically daily.   fluticasone 220 MCG/ACT inhaler Commonly known as:  FLOVENT HFA Inhale 2 puffs into the lungs 2 (two) times daily as needed (shortness of breath/ wheezing).   haloperidol 2 MG/ML  solution Commonly known as:  HALDOL Place 0.3 mLs (0.6 mg total) under the tongue every 4 (four) hours as needed for agitation (or delirium).   ipratropium-albuterol 0.5-2.5 (3) MG/3ML Soln Commonly known as:  DUONEB Take 3 mLs by nebulization every 6 (six) hours as needed. Wheezing or shortness of breath. What changed:  reasons to take this  additional instructions   morphine CONCENTRATE 10 MG/0.5ML Soln concentrated solution Place 0.25 mLs (5 mg total) under the tongue every 2 (two) hours as needed for moderate pain or shortness of breath (or dyspnea).   ondansetron 4 MG disintegrating tablet Commonly known as:  ZOFRAN-ODT Take 1 tablet (4 mg total) by mouth every 6 (six) hours as needed for nausea.   oxybutynin 5 MG tablet Commonly known as:  DITROPAN Take 5 mg by mouth 2 (two) times daily.   OXYGEN Inhale 2 L into the lungs continuous.   polyethylene glycol packet Commonly known as:  MIRALAX / GLYCOLAX Take 17 g by mouth daily as needed for mild constipation.   PROMETHEGAN 25 MG suppository Generic drug:  promethazine Place 25 mg rectally daily as needed for nausea or vomiting.   sodium hypochlorite 0.125 % Soln Commonly known as:  DAKIN'S 1/4 STRENGTH Irrigate with 1 application as directed 2 (two) times daily.            Durable Medical Equipment        Start     Ordered   09/20/16 1005  For home use only DME Hospital bed  Once    Question Answer Comment  Patient has (list medical condition): weakness, altered mental status   The above medical condition requires: Patient requires the ability to reposition frequently   Head must be elevated greater than: 30 degrees   Bed type Semi-electric      09/20/16 1004     Follow-up Information    ESKRIDGE, MATTHEW, MD Follow up on 09/16/2016.   Specialty:  Urology Why:  10:30 AM  Contact information: [address] [place] [address] [phone]        Laurena Slimmer, MD Follow up.   Specialty:   Internal Medicine Contact information: [address] [place] [address] [phone]            The results of significant diagnostics from this hospitalization (including imaging, microbiology, ancillary and laboratory) are listed below for reference.    Significant Diagnostic  Studies: Ct Head Wo Contrast  Result Date: 09/16/2016 CLINICAL DATA:  Altered mental status.  Stroke-like symptoms. EXAM: CT HEAD WITHOUT CONTRAST TECHNIQUE: Contiguous axial images were obtained from the base of the skull through the vertex without intravenous contrast. COMPARISON:  09/11/2016. FINDINGS: Brain: Diffusely enlarged ventricles and subarachnoid spaces. Patchy white matter low density in both cerebral hemispheres. No intracranial hemorrhage, mass lesion or CT evidence of acute infarction. Vascular: No hyperdense vessel or unexpected calcification. Skull: Normal. Negative for fracture or focal lesion. Sinuses/Orbits: No acute finding. Other: None. IMPRESSION: No acute abnormality. Stable atrophy and chronic small vessel white matter ischemic changes. Electronically Signed   By: Beckie Salts M.D.   On: 09/16/2016 12:21   US Renal  Result Date: 09/12/2016 CLINICAL DATA:  Hematuria EXAM: RENAL / URINARY TRACT ULTRASOUND COMPLETE COMPARISON:  08/04/2016 CT FINDINGS: Right Kidney: Length: 11.2 cm. There is a simple appearing cyst in the lateral aspect of the mid to upper pole measuring 3.7 x 4.6 x 3.7 cm. An additional 2.7 x 2.1 x 2.2 cm upper pole cortical based cyst is also noted. Mild increase in echogenicity of the renal parenchyma. No obstructive uropathy or nephrolithiasis. Left Kidney: Length: 10.2 cm. Echogenicity is increased. No nephrolithiasis. There is an caliectasis of the left renal collecting system with what appears to be extent in the region of the renal pelvis. The degree of dilatation has decreased since prior CT. Bladder: Decompressed by Foley catheter. IMPRESSION: 1. Simple  appearing right renal cyst. 2. Left-sided caliectasis with interval decompression of dilated left renal collecting system presumably by a stent which is partially visualized on these images. 3. Foley decompressed bladder. 4. Mild echogenicity of both kidneys may reflect medical renal disease. Electronically Signed   By: Tollie Eth M.D.   On: 09/12/2016 03:03   Dg Chest Port 1 View  Result Date: 09/23/2016 CLINICAL DATA:  Initial evaluation for decreased O2 saturation, shortness of breath. EXAM: PORTABLE CHEST 1 VIEW COMPARISON:  Prior radiograph from 09/11/2016. FINDINGS: Median sternotomy wires with underlying CABG markers. Stable cardiomegaly. Mediastinal silhouette within normal limits. Lungs hypoinflated. Bilateral perihilar scarring present, stable. No focal infiltrates. No pulmonary edema or pleural effusion. No pneumothorax. No acute osseus abnormality. Advanced degenerative changes present of left shoulder. Left-sided ureteral stent partially visualized. IMPRESSION: 1. Shallow lung inflation with bilateral perihilar scarring. No other active cardiopulmonary disease. 2. Stable cardiomegaly with sequelae of prior CABG. Electronically Signed   By: Rise Mu M.D.   On: 09/23/2016 15:50   Dg Chest Portable 1 View  Result Date: 09/11/2016 CLINICAL DATA:  Mental status change EXAM: PORTABLE CHEST 1 VIEW COMPARISON:  08/02/2016 FINDINGS: Shallow inspiration. Stable curvilinear scarring. No confluent consolidation. No large effusion. Unchanged cardiomegaly. Normal pulmonary vasculature. IMPRESSION: Stable curvilinear scarring. Unchanged cardiomegaly. No confluent consolidation or large effusion. Electronically Signed   By: Ellery Plunk M.D.   On: 09/11/2016 22:59   Xr Femur Min 2 Views Left  Result Date: 08/28/2016 X-ray femur show satisfactory alignment. Hardware intact after ORIF.  Xr Tibia/fibula Left  Result Date: 08/28/2016 X-ray tib-fib shows good bony anatomy. No acute change.  No signs of fracture.  Ct Head Code Stroke W/o Cm  Result Date: 09/11/2016 CLINICAL DATA:  Code stroke. 76 y/o F; left-sided droop and weakness. EXAM: CT HEAD WITHOUT CONTRAST TECHNIQUE: Contiguous axial images were obtained from the base of the skull through the vertex without intravenous contrast. COMPARISON:  CT head 10/15/2012. FINDINGS: Brain: No evidence of acute infarction, hemorrhage, hydrocephalus, extra-axial  collection or mass lesion/mass effect. Stable foci of hypoattenuation in subcortical and periventricular white matter compatible with microvascular ischemic changes. Stable lucencies in right caudate body, left caudate head, left lentiform nucleus, and left thalamus compatible with chronic lacunar infarcts. Vascular: No hyperdense vessel identified. Extensive calcific atherosclerosis of cavernous and paraclinoid internal carotid arteries. Skull: Normal. Negative for fracture or focal lesion. Sinuses/Orbits: No acute finding. Other: None. ASPECTS [facility] Stroke Program Early CT Score) - Ganglionic level infarction (caudate, lentiform nuclei, internal capsule, insula, M1-M3 cortex): 7 - Supraganglionic infarction (M4-M6 cortex): 3 Total score (0-10 with 10 being normal): 10 IMPRESSION: 1. No acute intracranial abnormality identified. 2. ASPECTS is 10 3. Stable chronic microvascular ischemic changes, parenchymal volume loss, and basal ganglia chronic lacunar infarcts. These results were called by telephone at the time of interpretation on 09/11/2016 at 10:35 pm to Dr. Charm Barges, who verbally acknowledged these results. Electronically Signed   By: Mitzi Hansen M.D.   On: 09/11/2016 22:35    Microbiology: Recent Results (from the past 240 hour(s))  Culture, blood (routine x 2)     Status: None   Collection Time: 09/16/16 10:57 AM  Result Value Ref Range Status   Specimen Description BLOOD LEFT ANTECUBITAL  Final   Special Requests IN PEDIATRIC BOTTLE 1CC  Final   Culture NO GROWTH 5  DAYS  Final   Report Status 09/21/2016 FINAL  Final  Culture, blood (routine x 2)     Status: None   Collection Time: 09/16/16 11:08 AM  Result Value Ref Range Status   Specimen Description BLOOD BLOOD LEFT HAND  Final   Special Requests IN PEDIATRIC BOTTLE 1CC  Final   Culture NO GROWTH 5 DAYS  Final   Report Status 09/21/2016 FINAL  Final     Labs: Basic Metabolic Panel:  Recent Labs Lab 09/19/16 0401 09/20/16 0252 09/22/16 0357 09/23/16 1039 09/24/16 0912  NA 133* 136 131* 128* 130*  K 3.7 3.7 3.8 3.9 4.1  CL 109 111 106 104 106  CO2 18* 21* 19* 16* 16*  GLUCOSE 79 84 108* 76 84  BUN 15 16 19 17 19   CREATININE 1.11* 1.17* 1.32* 1.21* 1.18*  CALCIUM 8.3* 8.2* 8.1* 8.0* 8.1*   Liver Function Tests: No results for input(s): AST, ALT, ALKPHOS, BILITOT, PROT, ALBUMIN in the last 168 hours. No results for input(s): LIPASE, AMYLASE in the last 168 hours. No results for input(s): AMMONIA in the last 168 hours. CBC:  Recent Labs Lab 09/20/16 0252 09/21/16 0450 09/22/16 0357 09/23/16 1039 09/24/16 0241  WBC 6.7 6.7 7.2 7.3 7.4  NEUTROABS  --   --   --   --  4.0  HGB 7.8* 7.6* 8.1* 7.5* 10.1*  HCT 24.6* 23.3* 25.1* 22.8* 31.4*  MCV 94.3 93.2 93.3 93.1 90.2  PLT 79* 78* 71* 70* 65*   Cardiac Enzymes: No results for input(s): CKTOTAL, CKMB, CKMBINDEX, TROPONINI in the last 168 hours. BNP: BNP (last 3 results)  Recent Labs  05/03/16 1859 06/02/16 1914 08/02/16 2352  BNP 257.2* 757.9* 500.3*    ProBNP (last 3 results) No results for input(s): PROBNP in the last 8760 hours.  CBG:  Recent Labs Lab 09/23/16 1150 09/23/16 1257 09/23/16 1436 09/23/16 1636 09/24/16 0751  GLUCAP 65 80 83 81 85

## 2016-09-24 NOTE — Progress Notes (Signed)
2nd unit of PRBC's finished transfusing, patient stable and resting comfortably.

## 2016-09-24 NOTE — Progress Notes (Addendum)
Pt's CBG's at 2000 was 71 at 0000 was 91 and 0400 was 80.

## 2016-09-24 NOTE — Progress Notes (Signed)
Per palliative, continue with ordered dressing changes. RN spoke with Lisa [name] regarding wound care, written on AVS for patient's family. Per hospice nurse, send patient home with dressing changes for home health palliative nurse. Elnita MaxwellCheryl states they have the prescriptions. Aware AVS includes medication list. IVs will be removed prior to transportation arriving.

## 2016-09-24 NOTE — Progress Notes (Signed)
Daily Progress Note   Patient Name: Lisa [name]       Date: 09/24/2016 DOB: January 16, 1941  Age: 76 y.o. MRN#: [id number] Attending Physician: Robbie Lis, MD Primary Care Physician: Foye Spurling, MD Admit Date: 09/11/2016  Reason for Consultation/Follow-up: Establishing goals of care and Terminal Care  Subjective: Met with Lisa [name] and Lisa [name] at bedside. They are ready to proceed with comfort care only for patient. They request DNR code status now. She is declining more rapidly now. Not eating again. Confusion is persisting and she continues to drop her hemoglobin and electrolytes. Comfort medications written and their uses reviewed. Prescriptions given to [facility]. Equipment is being delivered to their home. Patient will be transferred to [place] home by ambulance. They are appreciative of all the care the patient has received during her stay here at the hospital. We discussed that should patient become too much for them to care for in home, that residential Hospice would become an option. Lisa [name] states their goal is keep Mom home "until the end".   ROS  Length of Stay: 11  Current Medications: Scheduled Meds:  . acetaminophen  650 mg Oral TID  . budesonide  1 mg Nebulization BID  . buPROPion  150 mg Oral Daily  . collagenase   Topical Daily  . diclofenac sodium  2 g Topical QID  . oxybutynin  5 mg Oral BID  . pantoprazole  40 mg Oral Daily    Continuous Infusions:   PRN Meds: acetaminophen **OR** acetaminophen, diphenhydrAMINE, glycopyrrolate **OR** glycopyrrolate **OR** glycopyrrolate, haloperidol **OR** haloperidol **OR** haloperidol lactate, ipratropium-albuterol, magic mouthwash, menthol-cetylpyridinium, morphine CONCENTRATE **OR** morphine CONCENTRATE, ondansetron **OR**  ondansetron (ZOFRAN) IV, polyethylene glycol  Physical Exam          Vital Signs: BP 128/70 (BP Location: Right Arm)   Pulse 76   Temp 97.4 F (36.3 C) (Oral)   Resp 18   Ht _0  (1.651 m)   Wt 104.3 kg (229 lb 15 oz)   LMP  (LMP Unknown)   SpO2 98%   BMI 38.26 kg/m  SpO2: SpO2: 98 % O2 Device: O2 Device: Nasal Cannula O2 Flow Rate: O2 Flow Rate (L/min): 4 L/min  Intake/output summary:  Intake/Output Summary (Last 24 hours) at 09/24/16 1036 Last data filed at 09/24/16 0615  Gross per 24 hour  Intake  2870.42 ml  Output              450 ml  Net          2420.42 ml   LBM: Last BM Date: 09/20/16 Baseline Weight: Weight: 104.3 kg (229 lb 15 oz) Most recent weight: Weight: 104.3 kg (229 lb 15 oz)       Palliative Assessment/Data: PPS: 10%   Flowsheet Rows     Most Recent Value  Intake Tab  Referral Department  Hospitalist  Unit at Time of Referral  Med/Surg Unit  Palliative Care Primary Diagnosis  Neurology  Date Notified  09/17/16  Palliative Care Type  Return patient Palliative Care  Reason for referral  Clarify Goals of Care  Date of Admission  09/11/16  # of days IP prior to Palliative referral  6  Clinical Assessment  Psychosocial & Spiritual Assessment  Palliative Care Outcomes      Patient Active Problem List   Diagnosis Date Noted  . Palliative care by specialist   . Advance care planning   . Benign essential HTN 09/18/2016  . VRE (vancomycin-resistant Enterococci) infection 09/18/2016  . Enterococcus faecalis infection 09/18/2016  . CKD (chronic kidney disease) stage 4, GFR 15-29 ml/min (HCC) 09/18/2016  . Anemia of chronic disease 09/18/2016  . Thrombocytopenia ([place]) 09/18/2016  . Adult failure to thrive 09/18/2016  . Pressure injury of skin 09/18/2016  . Acute encephalopathy   . Goals of care, counseling/discussion   . Chronic anticoagulation 06/07/2016  . Chronic atrial fibrillation [facility])     Palliative Care Assessment & Plan    Patient Profile: 76 y.o.femalewith past medical history of DM, CHF, CAD (s/p CABG) A.Fib, HTN, hyperlipidemia, left femur fx s/p ORIF 08/05/16, hydronephrosis w/ cystoscopy and uretal stent placementadmitted on 3/7/2018with altered mental status. Workup revealed CBG hypoglycemia, but mental status did not improve with resolving blood sugar. She also was found to have VRE and frank hematuria, as well as entercoccus faecalis in blood culture. During admission she has had prolonged encephalopathy, EEG shows diffuse cerebral dysfunction of non-specific etiology. She had decreased po intake and was not taking po medications. Palliative medicine consulted for [place].    Assessment/Recommendations/Plan   DNR  Stop all life prolonging measures  Comfort measures only   Goals of Care and Additional Recommendations:  Limitations on Scope of Treatment: Avoid Hospitalization, Full Comfort Care, No Blood Transfusions, No Diagnostics, No Glucose Monitoring, No IV Antibiotics, No IV Fluids, No Lab Draws and No Surgical Procedures  Code Status:  DNR  Prognosis:   < 4 weeks  Discharge Planning:  Home with Hospice  Care plan was discussed with patient and her daughter's.  Thank you for allowing the Palliative Medicine Team to assist in the care of this patient.   Total time: 60 minutes Greater than 50%  of this time was spent counseling and coordinating care related to the above assessment and plan.  Mariana Kaufman, AGNP-C Palliative Medicine   Please contact Palliative Medicine Team phone at [phone] for questions and concerns.

## 2016-09-24 NOTE — Care Management Note (Addendum)
Case Management Note  Patient Details  Name: Lisa [name] MRN: [id number] Date of Birth: [birth year]  Subjective/Objective:                    Action/Plan: Pt discharging home with [facility]. At 1:45 pm received confirmation from family that equipment was set up in the home. [facility] arranged with oxygen for transportation. Transport form and DNR on the chart. Pts daughters took prescriptions home.  Addendum: (1630 pm): hospice aware of discharge home.  Expected Discharge Date:  09/24/16               Expected Discharge Plan:  Home w Hospice Care  In-House Referral:  Clinical Social Work  Discharge planning Services  CM Consult  Post Acute Care Choice:  Durable Medical Equipment, Home Health Choice offered to:  NA, Adult Children  DME Arranged:  Hospital bed, Oxygen DME Agency:  [facility]  [facility]  Status of Service:  Completed, signed off  If discussed at Long Length of Stay Meetings, dates discussed:    Additional Comments:  Kermit BaloKelli F Elinda Bunten, RN 09/24/2016, 5:18 PM

## 2016-09-24 NOTE — Patient Outreach (Signed)
[facility] Care Management  09/24/2016  [name] Rode [birth year] [id number]   Notified by hospital liaison that member will be discharged today, going home with hospice.  Discharge instructions already in chart.  Will send MD case closure letter, will notify care management assistant.  Kemper DurieMonica Miki Labuda, [place]RN, MSN [facility]  [facility] [phone]

## 2016-09-26 ENCOUNTER — Other Ambulatory Visit: Payer: Self-pay | Admitting: *Deleted

## 2016-09-26 NOTE — Patient Outreach (Signed)
[facility] Care Management  09/26/2016  [name] Winker 20-Feb-1941 [id number]   This social worker ws notified that patient has discharged from the hospital with Hospice services.  [facility].  Per RNCM, Kemper DurieMonica Lane she has notified patient's primary care doctor of case closure by letter.   Adriana ReamsChrystal Ademola Vert, LCSW [facility] [phone]

## 2016-09-26 NOTE — Telephone Encounter (Signed)
This encounter was created in error - please disregard.

## 2016-09-30 ENCOUNTER — Telehealth: Payer: Self-pay | Admitting: Cardiology

## 2016-09-30 NOTE — Telephone Encounter (Signed)
Lisa [name] is calling to let you know that Mrs. Lisa [name] is under Hospice care and is unable to come to any appointments . If there is something that she can do to help or if you need her . .. Please call   Thanks

## 2016-09-30 NOTE — Telephone Encounter (Signed)
To Dr Turner as FYI 

## 2016-10-01 ENCOUNTER — Ambulatory Visit (INDEPENDENT_AMBULATORY_CARE_PROVIDER_SITE_OTHER): Payer: Medicare Other | Admitting: Orthopaedic Surgery

## 2016-11-05 DEATH — deceased

## 2017-02-27 NOTE — Addendum Note (Signed)
Addendum  created 02/27/17 1022 by Kipp Brood, MD   Sign clinical note
